# Patient Record
Sex: Female | Born: 1945 | Race: White | Hispanic: No | Smoking: Former smoker
Health system: Southern US, Community
[De-identification: ages and names within clinical notes are randomized; demographics above are authoritative.]

## PROBLEM LIST (undated history)

## (undated) DIAGNOSIS — R600 Localized edema: Secondary | ICD-10-CM

## (undated) DIAGNOSIS — Z720 Tobacco use: Secondary | ICD-10-CM

## (undated) DIAGNOSIS — I639 Cerebral infarction, unspecified: Secondary | ICD-10-CM

## (undated) DIAGNOSIS — R112 Nausea with vomiting, unspecified: Secondary | ICD-10-CM

## (undated) DIAGNOSIS — I491 Atrial premature depolarization: Secondary | ICD-10-CM

## (undated) DIAGNOSIS — IMO0001 Reserved for inherently not codable concepts without codable children: Secondary | ICD-10-CM

## (undated) DIAGNOSIS — K859 Acute pancreatitis without necrosis or infection, unspecified: Secondary | ICD-10-CM

## (undated) DIAGNOSIS — I517 Cardiomegaly: Secondary | ICD-10-CM

## (undated) DIAGNOSIS — J449 Chronic obstructive pulmonary disease, unspecified: Secondary | ICD-10-CM

## (undated) DIAGNOSIS — C801 Malignant (primary) neoplasm, unspecified: Secondary | ICD-10-CM

## (undated) DIAGNOSIS — Z9889 Other specified postprocedural states: Secondary | ICD-10-CM

## (undated) DIAGNOSIS — G459 Transient cerebral ischemic attack, unspecified: Secondary | ICD-10-CM

## (undated) DIAGNOSIS — I10 Essential (primary) hypertension: Secondary | ICD-10-CM

## (undated) DIAGNOSIS — I671 Cerebral aneurysm, nonruptured: Secondary | ICD-10-CM

## (undated) DIAGNOSIS — I2510 Atherosclerotic heart disease of native coronary artery without angina pectoris: Secondary | ICD-10-CM

## (undated) HISTORY — PX: LAPAROSCOPY: SHX197

## (undated) HISTORY — DX: Cerebral aneurysm, nonruptured: I67.1

## (undated) HISTORY — PX: APPENDECTOMY: SHX54

## (undated) HISTORY — DX: Tobacco use: Z72.0

## (undated) HISTORY — DX: Cerebral infarction, unspecified: I63.9

## (undated) HISTORY — PX: NECK SURGERY: SHX720

## (undated) HISTORY — DX: Acute pancreatitis without necrosis or infection, unspecified: K85.90

## (undated) HISTORY — DX: Transient cerebral ischemic attack, unspecified: G45.9

## (undated) HISTORY — DX: Essential (primary) hypertension: I10

## (undated) HISTORY — DX: Localized edema: R60.0

## (undated) HISTORY — PX: ABDOMINAL HYSTERECTOMY: SHX81

## (undated) HISTORY — PX: VESICOVAGINAL FISTULA CLOSURE W/ TAH: SUR271

## (undated) HISTORY — DX: Cardiomegaly: I51.7

## (undated) HISTORY — DX: Atherosclerotic heart disease of native coronary artery without angina pectoris: I25.10

---

## 1994-01-23 DIAGNOSIS — I2510 Atherosclerotic heart disease of native coronary artery without angina pectoris: Secondary | ICD-10-CM

## 1994-01-23 HISTORY — PX: CARDIAC CATHETERIZATION: SHX172

## 1994-01-23 HISTORY — DX: Atherosclerotic heart disease of native coronary artery without angina pectoris: I25.10

## 1997-11-11 ENCOUNTER — Encounter: Payer: Self-pay | Admitting: Emergency Medicine

## 1997-11-11 ENCOUNTER — Emergency Department (HOSPITAL_COMMUNITY): Admission: EM | Admit: 1997-11-11 | Discharge: 1997-11-11 | Payer: Self-pay | Admitting: Emergency Medicine

## 1998-08-10 ENCOUNTER — Ambulatory Visit (HOSPITAL_COMMUNITY): Admission: RE | Admit: 1998-08-10 | Discharge: 1998-08-10 | Payer: Self-pay | Admitting: Family Medicine

## 1999-06-27 ENCOUNTER — Ambulatory Visit (HOSPITAL_COMMUNITY): Admission: RE | Admit: 1999-06-27 | Discharge: 1999-06-27 | Payer: Self-pay | Admitting: Family Medicine

## 1999-09-19 ENCOUNTER — Encounter: Payer: Self-pay | Admitting: Internal Medicine

## 1999-09-19 ENCOUNTER — Encounter: Admission: RE | Admit: 1999-09-19 | Discharge: 1999-09-19 | Payer: Self-pay | Admitting: Internal Medicine

## 1999-11-28 ENCOUNTER — Encounter: Admission: RE | Admit: 1999-11-28 | Discharge: 1999-11-28 | Payer: Self-pay | Admitting: Internal Medicine

## 1999-11-28 ENCOUNTER — Encounter: Payer: Self-pay | Admitting: Internal Medicine

## 2000-11-28 ENCOUNTER — Encounter: Admission: RE | Admit: 2000-11-28 | Discharge: 2000-11-28 | Payer: Self-pay | Admitting: Internal Medicine

## 2000-11-28 ENCOUNTER — Encounter: Payer: Self-pay | Admitting: Internal Medicine

## 2001-12-02 HISTORY — PX: CARDIOVASCULAR STRESS TEST: SHX262

## 2008-11-02 ENCOUNTER — Inpatient Hospital Stay (HOSPITAL_COMMUNITY): Admission: EM | Admit: 2008-11-02 | Discharge: 2008-11-05 | Payer: Self-pay | Admitting: Emergency Medicine

## 2008-11-02 ENCOUNTER — Encounter (INDEPENDENT_AMBULATORY_CARE_PROVIDER_SITE_OTHER): Payer: Self-pay | Admitting: Neurology

## 2008-11-03 ENCOUNTER — Ambulatory Visit: Payer: Self-pay | Admitting: Physical Medicine & Rehabilitation

## 2008-11-03 ENCOUNTER — Encounter (INDEPENDENT_AMBULATORY_CARE_PROVIDER_SITE_OTHER): Payer: Self-pay | Admitting: Neurology

## 2008-11-05 ENCOUNTER — Inpatient Hospital Stay (HOSPITAL_COMMUNITY)
Admission: RE | Admit: 2008-11-05 | Discharge: 2008-11-16 | Payer: Self-pay | Admitting: Physical Medicine & Rehabilitation

## 2008-11-07 ENCOUNTER — Ambulatory Visit: Payer: Self-pay | Admitting: Physical Medicine & Rehabilitation

## 2008-11-24 ENCOUNTER — Emergency Department (HOSPITAL_COMMUNITY): Admission: EM | Admit: 2008-11-24 | Discharge: 2008-11-24 | Payer: Self-pay | Admitting: Emergency Medicine

## 2008-12-11 ENCOUNTER — Encounter
Admission: RE | Admit: 2008-12-11 | Discharge: 2008-12-16 | Payer: Self-pay | Admitting: Physical Medicine & Rehabilitation

## 2008-12-14 ENCOUNTER — Ambulatory Visit: Payer: Self-pay | Admitting: Physical Medicine & Rehabilitation

## 2009-04-23 DIAGNOSIS — I517 Cardiomegaly: Secondary | ICD-10-CM

## 2009-04-23 HISTORY — DX: Cardiomegaly: I51.7

## 2009-04-24 ENCOUNTER — Ambulatory Visit: Payer: Self-pay | Admitting: Cardiovascular Disease

## 2009-04-24 ENCOUNTER — Inpatient Hospital Stay (HOSPITAL_COMMUNITY): Admission: EM | Admit: 2009-04-24 | Discharge: 2009-04-27 | Payer: Self-pay | Admitting: Emergency Medicine

## 2009-04-26 ENCOUNTER — Encounter: Payer: Self-pay | Admitting: Cardiology

## 2009-04-26 HISTORY — PX: US ECHOCARDIOGRAPHY: HXRAD669

## 2009-10-29 ENCOUNTER — Ambulatory Visit: Payer: Self-pay | Admitting: Cardiology

## 2009-11-29 ENCOUNTER — Ambulatory Visit: Payer: Self-pay | Admitting: Cardiology

## 2010-02-07 ENCOUNTER — Encounter: Payer: Self-pay | Admitting: Sports Medicine

## 2010-02-22 ENCOUNTER — Ambulatory Visit
Admission: RE | Admit: 2010-02-22 | Discharge: 2010-02-22 | Payer: Self-pay | Source: Home / Self Care | Attending: Sports Medicine | Admitting: Sports Medicine

## 2010-02-22 DIAGNOSIS — M84376A Stress fracture, unspecified foot, initial encounter for fracture: Secondary | ICD-10-CM | POA: Insufficient documentation

## 2010-02-23 DIAGNOSIS — I6350 Cerebral infarction due to unspecified occlusion or stenosis of unspecified cerebral artery: Secondary | ICD-10-CM | POA: Insufficient documentation

## 2010-02-24 NOTE — Consult Note (Signed)
Summary: [facility]  [facility]   Imported By: Marily Memos 02/09/2010 10:45:16  _____________________________________________________________________  External Attachment:    Type:   Image     Comment:   External Document

## 2010-03-02 NOTE — Assessment & Plan Note (Signed)
Summary: NP ORTHOTICS PER DR Makenlee Mckeag/[facility]   History of Present Illness: 66yo female referred to office by Dr. Margaretha Sheffield for custom orthotics.  Pt with Lt 5th MT stress fx diagnosed 01/20/10.  Treated with post-op shoe initially, has been in cam-boot for past 2-weeks with improving pain.  Still has some swelling in foot at end of day.  Denies any numbness/tingling.    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  She walks with assistance of a cane.      Allergies (verified): 1)  ! Dilaudid (Hydromorphone Hcl) 2)  ! 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  Mildy TTP along proximal 5th MT.  No TTP along 1-4th MTs.  Mild pain with MT squeeze.  No TTP along PF.  Collapse of longitudinal arch with associated pronation, collapse of transverse arch & splaying between 1st & 2nd toes.  Callous along 2nd, 3rd, 4th MT heads. - R foot: no soft tissue swelling.  No tenderness over forefoot, midfoot, hindfoort.  Collapse of longitudinal arch, minimal transverse arch collapse.    GAIT: no leg length difference.  Antalgic gait favoring left foot.  Noted to have over-pronation b/l. Pulses:  +2/4 DP & PT Neurologic:  sensation intact to light touch.     Impression & Recommendations:  Problem # 1:  STRESS FRACTURE, FOOT (ICD-733.94)  - Lt 5th MT stress fx - likely secondary to altered gait mechanics from previous CVA.  Also noted to have increased pronation on exam today. - Fitted with custom orthotics in office today Patient was fitted for a : standard, cushioned, semi-rigid orthotic. The  orthotic was heated and afterward the patient stood on the orthotic blank positioned on the orthotic stand. The patient was positioned in subtalar neutral position and 10 degrees of ankle dorsiflexion in a weight bearing stance. After completion of molding, a stable base was applied to the orthotic blank. The blank was ground to a stable position for weight bearing. Size: 8 - black stripped Base: blue EVA Posting: none Additional orthotic padding: MT pad on left insole Orthotics comfortable to patient in office, noted to have more neutral gait & less pronation 45-mins spent with patient for orthotic prep - Cont. to wear cam-boot until f/u with Dr. Margaretha Sheffield next week, will need to wear orthotic in all regular shoes when transition out of cam-boot - f/u as needed  Orders: [employer], each unit [phone])  Problem # 2:  Hx of CVA (ICD-434.91)  - Contributing to altered gait mechanics which likely lead to stress fx  Her updated medication list for this problem includes:    Plavix 75 Mg Tabs (Clopidogrel bisulfate) .Marland Kitchen... 1 tab by mouth daily  Orders: [employer], each unit ([id number])  Complete Medication List: 1)  Toprol  2)  Plavix 75 Mg Tabs (Clopidogrel bisulfate) .Marland Kitchen.. 1 tab by mouth daily 3)  Lisinopril  4)  Aspirin    Orders Added: 1)  New Patient Level III [82956] 2)  [facility] [employer], each unit [L3002]

## 2010-03-03 ENCOUNTER — Ambulatory Visit: Payer: Self-pay | Admitting: Cardiology

## 2010-03-29 ENCOUNTER — Encounter: Payer: Self-pay | Admitting: Cardiology

## 2010-03-29 DIAGNOSIS — I10 Essential (primary) hypertension: Secondary | ICD-10-CM | POA: Insufficient documentation

## 2010-03-29 DIAGNOSIS — I517 Cardiomegaly: Secondary | ICD-10-CM | POA: Insufficient documentation

## 2010-03-29 DIAGNOSIS — G459 Transient cerebral ischemic attack, unspecified: Secondary | ICD-10-CM | POA: Insufficient documentation

## 2010-04-13 LAB — LIPID PANEL
Cholesterol: 146 mg/dL (ref 0–200)
HDL: 46 mg/dL (ref >39)
Triglycerides: 146 mg/dL (ref <150)
VLDL: 29 mg/dL (ref 0–40)

## 2010-04-13 LAB — CBC
HCT: 40.2 % (ref 36.0–46.0)
MCHC: 34.4 g/dL (ref 30.0–36.0)
WBC: 4.5 10*3/uL (ref 4.0–10.5)

## 2010-04-13 LAB — URINE MICROSCOPIC-ADD ON

## 2010-04-13 LAB — PROTIME-INR: INR: 0.96 (ref 0.00–1.49)

## 2010-04-13 LAB — URINALYSIS, ROUTINE W REFLEX MICROSCOPIC
Bilirubin Urine: NEGATIVE
Ketones, ur: NEGATIVE mg/dL
Specific Gravity, Urine: 1.009 (ref 1.005–1.030)
pH: 7.5 (ref 5.0–8.0)

## 2010-04-13 LAB — BASIC METABOLIC PANEL
BUN: 11 mg/dL (ref 6–23)
BUN: 6 mg/dL (ref 6–23)
CO2: 28 mEq/L (ref 19–32)
Calcium: 8.7 mg/dL (ref 8.4–10.5)
Calcium: 9.1 mg/dL (ref 8.4–10.5)
Calcium: 9.6 mg/dL (ref 8.4–10.5)
Chloride: 105 mEq/L (ref 96–112)
Creatinine, Ser: 0.63 mg/dL (ref 0.4–1.2)
Creatinine, Ser: 0.71 mg/dL (ref 0.4–1.2)
GFR calc non Af Amer: >60 mL/min (ref >60)
Glucose, Bld: 110 mg/dL — ABNORMAL HIGH (ref 70–99)
Glucose, Bld: 124 mg/dL — ABNORMAL HIGH (ref 70–99)
Potassium: 3.6 mEq/L (ref 3.5–5.1)
Sodium: 136 mEq/L (ref 135–145)
Sodium: 141 mEq/L (ref 135–145)

## 2010-04-13 LAB — POCT CARDIAC MARKERS
Myoglobin, poc: 94.9 ng/mL (ref 12–200)
Troponin i, poc: <0.05 ng/mL (ref 0.00–0.09)

## 2010-04-13 LAB — HEMOGLOBIN A1C: Mean Plasma Glucose: 120 mg/dL

## 2010-04-13 LAB — DIGOXIN LEVEL: Digoxin Level: 0.9 ng/mL (ref 0.8–2.0)

## 2010-04-25 ENCOUNTER — Ambulatory Visit (INDEPENDENT_AMBULATORY_CARE_PROVIDER_SITE_OTHER): Payer: 59 | Admitting: Cardiology

## 2010-04-25 ENCOUNTER — Encounter: Payer: Self-pay | Admitting: Cardiology

## 2010-04-25 VITALS — BP 130/80 | HR 56 | Wt 143.0 lb

## 2010-04-25 DIAGNOSIS — Z79899 Other long term (current) drug therapy: Secondary | ICD-10-CM

## 2010-04-25 DIAGNOSIS — G459 Transient cerebral ischemic attack, unspecified: Secondary | ICD-10-CM

## 2010-04-25 DIAGNOSIS — I10 Essential (primary) hypertension: Secondary | ICD-10-CM

## 2010-04-25 DIAGNOSIS — M84376A Stress fracture, unspecified foot, initial encounter for fracture: Secondary | ICD-10-CM

## 2010-04-25 LAB — BASIC METABOLIC PANEL
Calcium: 8.8 mg/dL (ref 8.4–10.5)
Chloride: 100 mEq/L (ref 96–112)
Creatinine, Ser: 0.6 mg/dL (ref 0.4–1.2)
Glucose, Bld: 82 mg/dL (ref 70–99)
Potassium: 4.1 mEq/L (ref 3.5–5.1)

## 2010-04-25 NOTE — Assessment & Plan Note (Signed)
The patient's blood pressure has been improving at home.  She does have a slight dry cough from the lisinopril but does not wish to switch to something else.  Previously we had tried Benicar which she did not like.  We'll continue same dose of lisinopril 10 mg daily.  She takes her hydrochlorothiazide and her potassium only if she has swelling

## 2010-04-25 NOTE — Assessment & Plan Note (Signed)
Since last visit the patient has been feeling better.  She's had no further symptoms of TIA or stroke.  She's not having any chest pain or angina.

## 2010-04-25 NOTE — Assessment & Plan Note (Signed)
The patient broke a bone in the left lateral foot.  She is wearing a special insert in her shoe.  There is no history of trauma involved with the fracture.  It is gradually improving

## 2010-04-25 NOTE — Progress Notes (Signed)
History of Present Illness: This pleasant 65 year old woman is seen for a scheduled followup office visit.  She was hospitalized a year ago from 04/23/09 through 04/27/09 for a transient ischemic attack involving the right side of her body.  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  She's had a history of labile hypertension.  She's had a history of left ventricular hypertrophy with diastolic dysfunction.  She has not had clinical congestive heart failure.  She still smokes a few cigarettes daily against advice  Current Outpatient Prescriptions  Medication Sig Dispense Refill  . aspirin 81 MG tablet Take 81 mg by mouth daily. Taking every other day      . clopidogrel (PLAVIX) 75 MG tablet Take 75 mg by mouth daily.        . diazepam (VALIUM) 5 MG tablet Take 5 mg by mouth every 6 (six) hours as needed.        . digoxin (LANOXIN) 0.25 MG tablet Take 250 mcg by mouth daily.        . hydrochlorothiazide 25 MG tablet Take 25 mg by mouth as needed.        Marland Kitchen lisinopril (PRINIVIL,ZESTRIL) 10 MG tablet Take 10 mg by mouth daily.        . metoprolol (TOPROL-XL) 50 MG 24 hr tablet Take 25 mg by mouth 2 (two) times daily.        . potassium chloride (KLOR-CON) 10 MEQ CR tablet Take 10 mEq by mouth as needed.          Allergies  Allergen Reactions  . Codeine   . Hydromorphone Hcl     Patient Active Problem List  Diagnoses  . CVA  . STRESS FRACTURE, FOOT  . TIA (transient ischemic attack)  . Hypertension  . Ventricular hypertrophy    History  Smoking status  . Passive Smoker  . Types: Cigarettes  Smokeless tobacco  . Not on file    History  Alcohol Use No    Family History  Problem Relation Age of Onset  . Hypertension Mother   . Emphysema Father     Review of Systems: Constitutional: no fever chills diaphoresis or fatigue or change in weight.  Head and neck: no hearing loss,  no epistaxis, no photophobia or visual disturbance. Respiratory: No cough, shortness of breath or wheezing. Cardiovascular: No chest pain peripheral edema, palpitations. Gastrointestinal: No abdominal distention, no abdominal pain, no change in bowel habits hematochezia or melena. Genitourinary: No dysuria, no frequency, no urgency, no nocturia. Musculoskeletal:No arthralgias, no back pain, no gait disturbance or myalgias. Neurological: No dizziness, no headaches, no numbness, no seizures, no syncope, no weakness, no tremors. Hematologic: No lymphadenopathy, no easy bruising. Psychiatric: No confusion, no hallucinations, no sleep disturbance.    Physical Exam: Filed Vitals:   04/25/10 1019  BP: 130/80  Pulse: 56  Weight is 143, up 1 pound.  This is a well-developed well-nourished woman in no distress.Pupils equal and reactive.   Extraocular Movements are full.  There is no scleral icterus.  The mouth and pharynx are normal.  The neck is supple.  The carotids reveal no bruits.  The jugular venous pressure is normal.  The thyroid is not enlarged.  There is no lymphadenopathy.The chest is clear to percussion and auscultation. There are no rales or rhonchi. Expansion of the chest is symmetrical.The precordium is quiet.  The first heart sound  is normal.  The second heart sound is physiologically split.  There is no murmur gallop rub or click.  There is no abnormal lift or heave.The abdomen is soft and nontender. Bowel sounds are normal. The liver and spleen are not enlarged. There Are no abdominal masses. There are no bruits.The pedal pulses are good.  There is no phlebitis or edema.  There is no cyanosis or clubbing.  Neurologic exam reveals slightly decreased equilibrium and decreased balance.   Assessment / Plan:  We counseled her about quitting smoking altogether.  She is to continue same dose of medication.  Recheck in 6 months for followup office visit and fasting lipid panel and  chemistries

## 2010-04-27 ENCOUNTER — Telehealth: Payer: Self-pay | Admitting: *Deleted

## 2010-04-27 LAB — DIGOXIN LEVEL: Digoxin Level: 0.2 ng/mL — ABNORMAL LOW (ref 0.8–2.0)

## 2010-04-27 LAB — POCT I-STAT, CHEM 8
BUN: 8 mg/dL (ref 6–23)
Chloride: 102 mEq/L (ref 96–112)
Potassium: 3.9 mEq/L (ref 3.5–5.1)

## 2010-04-27 NOTE — Telephone Encounter (Signed)
Message copied by Regis Bill on Wed Apr 27, 2010 11:59 AM ------      Message from: Cassell Clement      Created: Tue Apr 26, 2010  8:48 AM       BMET normal   CSD.

## 2010-04-27 NOTE — Telephone Encounter (Signed)
Advised patient of labs 

## 2010-04-28 LAB — DIFFERENTIAL
Basophils Absolute: 0 10*3/uL (ref 0.0–0.1)
Basophils Relative: 0 % (ref 0–1)
Basophils Relative: 1 % (ref 0–1)
Eosinophils Absolute: 0 10*3/uL (ref 0.0–0.7)
Lymphs Abs: 1.4 10*3/uL (ref 0.7–4.0)
Monocytes Absolute: 0.3 10*3/uL (ref 0.1–1.0)
Monocytes Relative: 6 % (ref 3–12)
Neutro Abs: 2.6 10*3/uL (ref 1.7–7.7)
Neutro Abs: 3.3 10*3/uL (ref 1.7–7.7)
Neutrophils Relative %: 61 % (ref 43–77)
Neutrophils Relative %: 66 % (ref 43–77)

## 2010-04-28 LAB — URINE CULTURE: Colony Count: 7000

## 2010-04-28 LAB — CK TOTAL AND CKMB (NOT AT ARMC)
CK, MB: 1.3 ng/mL (ref 0.3–4.0)
CK, MB: 1.3 ng/mL (ref 0.3–4.0)
Relative Index: INVALID (ref 0.0–2.5)
Relative Index: INVALID (ref 0.0–2.5)
Total CK: 47 U/L (ref 7–177)
Total CK: 50 U/L (ref 7–177)

## 2010-04-28 LAB — CBC
HCT: 38.1 % (ref 36.0–46.0)
Hemoglobin: 13.4 g/dL (ref 12.0–15.0)
Hemoglobin: 14 g/dL (ref 12.0–15.0)
MCHC: 35.2 g/dL (ref 30.0–36.0)
MCV: 86.8 fL (ref 78.0–100.0)
MCV: 87 fL (ref 78.0–100.0)
Platelets: 144 10*3/uL — ABNORMAL LOW (ref 150–400)
RBC: 4.68 MIL/uL (ref 3.87–5.11)
RDW: 14.2 % (ref 11.5–15.5)
WBC: 5 10*3/uL (ref 4.0–10.5)

## 2010-04-28 LAB — APTT
aPTT: 26 seconds (ref 24–37)
aPTT: 26 seconds (ref 24–37)

## 2010-04-28 LAB — COMPREHENSIVE METABOLIC PANEL
ALT: 21 U/L (ref 0–35)
ALT: 23 U/L (ref 0–35)
AST: 27 U/L (ref 0–37)
Albumin: 3.5 g/dL (ref 3.5–5.2)
Albumin: 3.7 g/dL (ref 3.5–5.2)
Alkaline Phosphatase: 34 U/L — ABNORMAL LOW (ref 39–117)
Alkaline Phosphatase: 36 U/L — ABNORMAL LOW (ref 39–117)
BUN: 17 mg/dL (ref 6–23)
Calcium: 8.8 mg/dL (ref 8.4–10.5)
Calcium: 8.9 mg/dL (ref 8.4–10.5)
Creatinine, Ser: 0.61 mg/dL (ref 0.4–1.2)
GFR calc Af Amer: >60 mL/min (ref >60)
GFR calc Af Amer: >60 mL/min (ref >60)
Glucose, Bld: 106 mg/dL — ABNORMAL HIGH (ref 70–99)
Glucose, Bld: 108 mg/dL — ABNORMAL HIGH (ref 70–99)
Potassium: 3.6 mEq/L (ref 3.5–5.1)
Potassium: 3.6 mEq/L (ref 3.5–5.1)
Sodium: 139 mEq/L (ref 135–145)
Sodium: 141 mEq/L (ref 135–145)
Total Bilirubin: 0.9 mg/dL (ref 0.3–1.2)
Total Protein: 6.4 g/dL (ref 6.0–8.3)
Total Protein: 6.8 g/dL (ref 6.0–8.3)
Total Protein: 7.1 g/dL (ref 6.0–8.3)

## 2010-04-28 LAB — BASIC METABOLIC PANEL
CO2: 27 mEq/L (ref 19–32)
Calcium: 9 mg/dL (ref 8.4–10.5)
Chloride: 106 mEq/L (ref 96–112)
GFR calc Af Amer: >60 mL/min (ref >60)
GFR calc non Af Amer: >60 mL/min (ref >60)
GFR calc non Af Amer: >60 mL/min (ref >60)
Glucose, Bld: 98 mg/dL (ref 70–99)
Glucose, Bld: 102 mg/dL — ABNORMAL HIGH (ref 70–99)
Potassium: 4.2 mEq/L (ref 3.5–5.1)
Potassium: 3.5 mEq/L (ref 3.5–5.1)
Sodium: 139 mEq/L (ref 135–145)
Sodium: 140 mEq/L (ref 135–145)

## 2010-04-28 LAB — PROTIME-INR
INR: 1.02 (ref 0.00–1.49)
INR: 1.02 (ref 0.00–1.49)
Prothrombin Time: 13.3 seconds (ref 11.6–15.2)

## 2010-04-28 LAB — DIGOXIN LEVEL: Digoxin Level: 0.5 ng/mL — ABNORMAL LOW (ref 0.8–2.0)

## 2010-04-28 LAB — URINALYSIS, MICROSCOPIC ONLY
Glucose, UA: NEGATIVE mg/dL
Nitrite: NEGATIVE
Protein, ur: NEGATIVE mg/dL

## 2010-04-28 LAB — LIPID PANEL
Cholesterol: 161 mg/dL (ref 0–200)
LDL Cholesterol: 85 mg/dL (ref 0–99)
VLDL: 39 mg/dL (ref 0–40)

## 2010-06-21 ENCOUNTER — Telehealth: Payer: Self-pay | Admitting: Cardiology

## 2010-06-21 NOTE — Telephone Encounter (Signed)
Rtn call to pt. Pt reports feeling better but still has cough. She reports drainage to be green/yellow. She is taking Robutussin DM 1 tsp q3-4 hours with relief. Pt advised  Dr Patty Sermons recommends Mucinex 600mg  OTC BID with plenty of liquids and that he would give script for a z-pak. She has concerns of taking z-pak due to other meds she takes and medical hx. She knows she can take amoxicillin and would take that if Dr Patty Sermons would see it necessary. Informed her would discuss with Dr Patty Sermons and get back with her.

## 2010-06-21 NOTE — Telephone Encounter (Signed)
PT SAID SHE HAS HAD A COLD FOR ABOUT 2 AND HALF WEEKS, THINKS IT IS BRONCHIAL AND WANTS TO SEE DR BB. CHART IN BOX.

## 2010-06-22 MED ORDER — AMOXICILLIN 500 MG PO CAPS: 500.0000 mg | ORAL_CAPSULE | Freq: Three times a day (TID) | ORAL | Status: AC

## 2010-06-22 NOTE — Telephone Encounter (Signed)
Returned call to pt she thinks she need the amoxicillin but is feeling better. Has not tried mucinex. She is heading to the pharmacy now.

## 2010-06-27 ENCOUNTER — Other Ambulatory Visit: Payer: Self-pay | Admitting: *Deleted

## 2010-06-27 MED ORDER — DIGOXIN 250 MCG PO TABS: ORAL_TABLET | ORAL | Status: DC

## 2010-06-27 NOTE — Telephone Encounter (Signed)
escribe medication per fax request  

## 2010-07-01 ENCOUNTER — Other Ambulatory Visit: Payer: Self-pay | Admitting: *Deleted

## 2010-07-01 MED ORDER — AMOXICILLIN 500 MG PO CAPS: 500.0000 mg | ORAL_CAPSULE | Freq: Three times a day (TID) | ORAL | Status: AC

## 2010-07-01 NOTE — Telephone Encounter (Signed)
Patient phoned requesting 10 more amoxicillin 500 mg three times a day.  States feeling better but thinks she needs a few more days worth.  Ok per Dr Patty Sermons Left message

## 2010-07-04 ENCOUNTER — Encounter: Payer: Self-pay | Admitting: Cardiology

## 2010-10-07 ENCOUNTER — Encounter: Payer: Self-pay | Admitting: Nurse Practitioner

## 2010-10-11 ENCOUNTER — Encounter: Payer: Self-pay | Admitting: Nurse Practitioner

## 2010-10-24 ENCOUNTER — Encounter: Payer: Self-pay | Admitting: Nurse Practitioner

## 2010-10-24 ENCOUNTER — Ambulatory Visit (INDEPENDENT_AMBULATORY_CARE_PROVIDER_SITE_OTHER): Payer: Medicare Other | Admitting: Nurse Practitioner

## 2010-10-24 ENCOUNTER — Ambulatory Visit (INDEPENDENT_AMBULATORY_CARE_PROVIDER_SITE_OTHER): Payer: Medicare Other | Admitting: *Deleted

## 2010-10-24 DIAGNOSIS — I10 Essential (primary) hypertension: Secondary | ICD-10-CM

## 2010-10-24 DIAGNOSIS — I6350 Cerebral infarction due to unspecified occlusion or stenosis of unspecified cerebral artery: Secondary | ICD-10-CM

## 2010-10-24 DIAGNOSIS — F17200 Nicotine dependence, unspecified, uncomplicated: Secondary | ICD-10-CM

## 2010-10-24 DIAGNOSIS — Z720 Tobacco use: Secondary | ICD-10-CM | POA: Insufficient documentation

## 2010-10-24 DIAGNOSIS — G459 Transient cerebral ischemic attack, unspecified: Secondary | ICD-10-CM

## 2010-10-24 LAB — BASIC METABOLIC PANEL
CO2: 25 mEq/L (ref 19–32)
Chloride: 104 mEq/L (ref 96–112)
Glucose, Bld: 98 mg/dL (ref 70–99)
Potassium: 4.1 mEq/L (ref 3.5–5.1)
Sodium: 140 mEq/L (ref 135–145)

## 2010-10-24 LAB — LIPID PANEL
Cholesterol: 165 mg/dL (ref 0–200)
LDL Cholesterol: 83 mg/dL (ref 0–99)

## 2010-10-24 LAB — HEPATIC FUNCTION PANEL
ALT: 21 U/L (ref 0–35)
AST: 26 U/L (ref 0–37)
Alkaline Phosphatase: 39 U/L (ref 39–117)
Bilirubin, Direct: 0.2 mg/dL (ref 0.0–0.3)
Total Protein: 7.1 g/dL (ref 6.0–8.3)

## 2010-10-24 NOTE — Patient Instructions (Addendum)
Stay on your current medicines. We will see you back in about 6 months I encourage you to work on your smoking and try to stop.  Stay active. Monitor your blood pressure at home.  Record your readings and bring to your next visit. Goal is for your blood pressure to stay below 135/85 or less. Call if blood pressure remains up. Limit sodium intake.   Call for any problems.

## 2010-10-24 NOTE — Assessment & Plan Note (Signed)
I have explained the importance of good blood pressure control and smoking cessation to prevent recurrence.

## 2010-10-24 NOTE — Assessment & Plan Note (Signed)
Blood pressure is up here today. She thinks it is fine at home. She has had her medicines this morning. She is willing to recheck and start back monitoring at home. Goal is to be less than 135/85. She is to call if her readings remain higher. We will tentatively see her back in 6 months. Patient is agreeable to this plan and will call if any problems develop in the interim.

## 2010-10-24 NOTE — Progress Notes (Signed)
[facility] Date of Birth: [birth year]   History of Present Illness: [facility] is seen today for her 6 month visit. She is seen for Dr. Patty Sermons. She is doing well. Still smoking some. No chest pain or shortness of breath. Blood pressure is up today. She says it is fine at home but admits to not checking it recently. She continues to use her HCTZ prn. She is tolerating her medicines. She has chronic swelling of her left foot. She has no real complaint today.   Current Outpatient Prescriptions on File Prior to Visit  Medication Sig Dispense Refill  . aspirin 81 MG tablet Take 81 mg by mouth daily. Taking every other day      . clopidogrel (PLAVIX) 75 MG tablet Take 75 mg by mouth daily.        . diazepam (VALIUM) 5 MG tablet Take 5 mg by mouth every 6 (six) hours as needed.        . hydrochlorothiazide 25 MG tablet Take 25 mg by mouth as needed.        Marland Kitchen lisinopril (PRINIVIL,ZESTRIL) 10 MG tablet Take 10 mg by mouth daily.        . metoprolol (TOPROL-XL) 50 MG 24 hr tablet Take 25 mg by mouth 2 (two) times daily.        . potassium chloride (KLOR-CON) 10 MEQ CR tablet Take 10 mEq by mouth as needed.        Marland Kitchen DISCONTD: digoxin (LANOXIN) 0.25 MG tablet Take 1/2 tablet twice a day  30 tablet  11    Allergies  Allergen Reactions  . Codeine   . Hydromorphone Hcl   . Sulfa Drugs Cross Reactors     Past Medical History  Diagnosis Date  . Ventricular hypertrophy 04/2009    LVH with diastolic dysfunction by echo. Has normal EF.  Marland Kitchen Pancreatitis     x2  . PAF (paroxysmal atrial fibrillation)   . TIA (transient ischemic attack)     She was hospitalized 04-23-09 through 04-27-09 for involving right side of the body  . 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 Hypertension   . 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  . Tobacco abuse     Ongoing     . Edema of foot     She has a history of chronic edema of the left dated back to age 73 when she suufered severe frostbite playing  on the snow as a child  . Coronary artery disease 1996    Known with prior mild lesion of LAD demonstrated by Cardiac Catheterization in 1996    Past Surgical History  Procedure Date  . 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 Neck surgery 50 yrs ago    Left side  . Cardiac catheterization 1996    Mild CAD with vasospasm  . US echocardiography 04-26-2009    EF 65-70%  . Cardiovascular stress test 12-02-2001    EF 70%    History  Smoking status  . Passive Smoker  . Types: Cigarettes  Smokeless tobacco  . Current User    History  Alcohol Use No    Family History  Problem Relation Age of Onset  . Emphysema Father   . Aneurysm Sister  Review of Systems: The review of systems is positive for chronic edema of the left foot. She uses her HCTZ prn. Daily use of it makes her feel weak.  Still has some residual issues from her prior stroke. Walks with a limp and some weakness in the arm. All other systems were reviewed and are negative.  Physical Exam: BP 170/90  Pulse 54  Ht 5\' 2"  (1.575 m)  Wt 141 lb 1.9 oz (64.012 kg)  BMI 25.81 kg/m2 Patient is very pleasant and in no acute distress. Skin is warm and dry. Color is normal.  HEENT is unremarkable. Normocephalic/atraumatic. PERRL. Sclera are nonicteric. Neck is supple. No masses. No JVD. Lungs are clear. Cardiac exam shows a regular rate and rhythm. Abdomen is soft. Extremities are with 2+ edema on the left. Gait and ROM are intact. She is using a cane. No gross neurologic deficits noted.   LABORATORY DATA:  PENDING   Assessment / Plan:

## 2010-10-24 NOTE — Assessment & Plan Note (Signed)
Smoking cessation is encouraged 

## 2010-10-28 ENCOUNTER — Telehealth: Payer: Self-pay | Admitting: *Deleted

## 2010-10-28 NOTE — Telephone Encounter (Signed)
Message copied by Burnell Blanks on Fri Oct 28, 2010  2:26 PM ------      Message from: Cassell Clement      Created: Tue Oct 25, 2010  8:34 AM       Please report.  The lipids are satisfactory.  The triglycerides are improved.  The chemistries are satisfactory.  Continue same medication.

## 2010-10-28 NOTE — Telephone Encounter (Signed)
Advised of labs 

## 2010-10-28 NOTE — Progress Notes (Signed)
Advised of labs 

## 2011-01-25 ENCOUNTER — Other Ambulatory Visit: Payer: Self-pay | Admitting: Cardiology

## 2011-01-25 MED ORDER — METOPROLOL SUCCINATE ER 50 MG PO TB24: 25.0000 mg | ORAL_TABLET | Freq: Two times a day (BID) | ORAL | Status: DC

## 2011-02-23 ENCOUNTER — Other Ambulatory Visit: Payer: Self-pay | Admitting: *Deleted

## 2011-02-23 MED ORDER — CLOPIDOGREL BISULFATE 75 MG PO TABS: 75.0000 mg | ORAL_TABLET | Freq: Every day | ORAL | Status: DC

## 2011-02-23 NOTE — Telephone Encounter (Signed)
Refilled plavix 

## 2011-03-22 ENCOUNTER — Other Ambulatory Visit: Payer: Self-pay | Admitting: *Deleted

## 2011-03-22 MED ORDER — LISINOPRIL 10 MG PO TABS: 10.0000 mg | ORAL_TABLET | Freq: Every day | ORAL | Status: DC

## 2011-04-24 ENCOUNTER — Ambulatory Visit: Payer: Medicare Other | Admitting: Cardiology

## 2011-05-15 ENCOUNTER — Encounter: Payer: Self-pay | Admitting: Cardiology

## 2011-05-15 ENCOUNTER — Ambulatory Visit (INDEPENDENT_AMBULATORY_CARE_PROVIDER_SITE_OTHER): Payer: Medicare Other | Admitting: Cardiology

## 2011-05-15 VITALS — BP 110/80 | HR 64 | Ht 62.0 in | Wt 144.0 lb

## 2011-05-15 DIAGNOSIS — G459 Transient cerebral ischemic attack, unspecified: Secondary | ICD-10-CM

## 2011-05-15 DIAGNOSIS — I6350 Cerebral infarction due to unspecified occlusion or stenosis of unspecified cerebral artery: Secondary | ICD-10-CM

## 2011-05-15 DIAGNOSIS — I10 Essential (primary) hypertension: Secondary | ICD-10-CM

## 2011-05-15 DIAGNOSIS — I119 Hypertensive heart disease without heart failure: Secondary | ICD-10-CM

## 2011-05-15 NOTE — Assessment & Plan Note (Signed)
The patient remains on daily aspirin.  She's had no recurrent TIA or stroke symptoms

## 2011-05-15 NOTE — Assessment & Plan Note (Signed)
Blood pressure has been remaining stable on current therapy.  She's not having any headaches or dizzy spells.  She has not been experiencing any palpitations and she is on long-term brand name Lanoxin

## 2011-05-15 NOTE — Progress Notes (Signed)
[facility] Date of Birth:  May 16, 1945 [facility] [address] [place], [place]  [address] [phone]         Fax   [phone]  History of Present Illness: This pleasant 66 year old woman is seen for a scheduled followup office visit.  He has a past history of a prior stroke.  She's had problems with high blood pressure in the past.  His last visit she's been doing well with no new cardiac symptoms.  The date of her stroke was October 2010.  Current Outpatient Prescriptions  Medication Sig Dispense Refill  . aspirin 81 MG tablet Take 81 mg by mouth daily. Taking every other day      . clopidogrel (PLAVIX) 75 MG tablet Take 1 tablet (75 mg total) by mouth daily.  30 tablet  5  . diazepam (VALIUM) 5 MG tablet Take 5 mg by mouth every 6 (six) hours as needed.        . digoxin (LANOXIN) 0.25 MG tablet Take 250 mcg by mouth daily.        . hydrochlorothiazide 25 MG tablet Take 25 mg by mouth as needed.        Marland Kitchen lisinopril (PRINIVIL,ZESTRIL) 10 MG tablet Take 1 tablet (10 mg total) by mouth daily.  30 tablet  6  . metoprolol (TOPROL-XL) 50 MG 24 hr tablet Take 0.5 tablets (25 mg total) by mouth 2 (two) times daily.  30 tablet  6  . potassium chloride (KLOR-CON) 10 MEQ CR tablet Take 10 mEq by mouth as needed.          Allergies  Allergen Reactions  . Codeine   . Hydromorphone Hcl   . Sulfa Drugs Cross Reactors     Patient Active Problem List  Diagnoses  . CVA  . STRESS FRACTURE, FOOT  . TIA (transient ischemic attack)  . Hypertension  . Ventricular hypertrophy  . Tobacco abuse    History  Smoking status  . Passive Smoker  . Types: Cigarettes  Smokeless tobacco  . Current User    History  Alcohol Use No    Family History  Problem Relation Age of Onset  . Emphysema Father   . Aneurysm Sister     Review of Systems: Constitutional: no fever chills diaphoresis or fatigue or change in weight.  Head and neck: no hearing loss, no  epistaxis, no photophobia or visual disturbance. Respiratory: No cough, shortness of breath or wheezing. Cardiovascular: No chest pain peripheral edema, palpitations. Gastrointestinal: No abdominal distention, no abdominal pain, no change in bowel habits hematochezia or melena. Genitourinary: No dysuria, no frequency, no urgency, no nocturia. Musculoskeletal:No arthralgias, no back pain, no gait disturbance or myalgias. Neurological: No dizziness, no headaches, no numbness, no seizures, no syncope, no weakness, no tremors. Hematologic: No lymphadenopathy, no easy bruising. Psychiatric: No confusion, no hallucinations, no sleep disturbance.    Physical Exam: Filed Vitals:   05/15/11 1000  BP: 110/80  Pulse: 64   the general appearance reveals a well-developed well-nourished woman in no distress.The head and neck exam reveals pupils equal and reactive.  Extraocular movements are full.  There is no scleral icterus.  The mouth and pharynx are normal.  The neck is supple.  The carotids reveal no bruits.  The jugular venous pressure is normal.  The  thyroid is not enlarged.  There is no lymphadenopathy.  The chest is clear to percussion and auscultation.  There are no rales or rhonchi.  Expansion of the chest  is symmetrical.  The precordium is quiet.  The first heart sound is normal.  The second heart sound is physiologically split.  There is no murmur gallop rub or click.  There is no abnormal lift or heave.  The abdomen is soft and nontender.  The bowel sounds are normal.  The liver and spleen are not enlarged.  There are no abdominal masses.  There are no abdominal bruits.  Extremities reveal good pedal pulses.  There is no phlebitis or edema.  There is no cyanosis or clubbing.  Strength is normal and symmetrical in all extremities.  There is no lateralizing weakness.  There are no sensory deficits.  The skin is warm and dry.  There is no rash.    Assessment / Plan: Continue same medication.   Recheck in 6 months for followup office visit EKG lipid panel hepatic function panel and basal metabolic panel.  Also she states that when she was hospitalized she was told that her tests showed that she had an STD but she doesn't recall any of the details.  She wants to know more about it.  I don't see any mention of this in her hospital discharge summary.  She has some literature at home that she will bring in.

## 2011-05-15 NOTE — Patient Instructions (Signed)
Your physician recommends that you continue on your current medications as directed. Please refer to the Current Medication list given to you today.  Your physician wants you to follow-up in: 6 months. You will receive a reminder letter in the mail two months in advance. If you don't receive a letter, please call our office to schedule the follow-up appointment.  

## 2011-06-27 ENCOUNTER — Other Ambulatory Visit: Payer: Self-pay | Admitting: Cardiology

## 2011-06-27 MED ORDER — DIGOXIN 250 MCG PO TABS: 250.0000 ug | ORAL_TABLET | Freq: Every day | ORAL | Status: DC

## 2011-06-27 NOTE — Telephone Encounter (Signed)
Refill completed.

## 2011-06-27 NOTE — Telephone Encounter (Signed)
Refill- Digoxin Lanoxin   Verified preferred  [facility].

## 2011-09-20 ENCOUNTER — Other Ambulatory Visit: Payer: Self-pay | Admitting: *Deleted

## 2011-09-20 MED ORDER — METOPROLOL SUCCINATE ER 50 MG PO TB24: ORAL_TABLET | ORAL | Status: DC

## 2011-09-20 NOTE — Telephone Encounter (Signed)
Refilled metoprolol 

## 2011-11-13 ENCOUNTER — Ambulatory Visit (INDEPENDENT_AMBULATORY_CARE_PROVIDER_SITE_OTHER): Payer: Medicare Other | Admitting: Cardiology

## 2011-11-13 ENCOUNTER — Other Ambulatory Visit: Payer: Medicare Other

## 2011-11-13 ENCOUNTER — Encounter: Payer: Self-pay | Admitting: Cardiology

## 2011-11-13 VITALS — BP 166/80 | HR 49 | Ht 61.0 in | Wt 141.8 lb

## 2011-11-13 DIAGNOSIS — I259 Chronic ischemic heart disease, unspecified: Secondary | ICD-10-CM | POA: Insufficient documentation

## 2011-11-13 DIAGNOSIS — Z720 Tobacco use: Secondary | ICD-10-CM

## 2011-11-13 DIAGNOSIS — I119 Hypertensive heart disease without heart failure: Secondary | ICD-10-CM

## 2011-11-13 DIAGNOSIS — F17200 Nicotine dependence, unspecified, uncomplicated: Secondary | ICD-10-CM

## 2011-11-13 DIAGNOSIS — I10 Essential (primary) hypertension: Secondary | ICD-10-CM

## 2011-11-13 DIAGNOSIS — G459 Transient cerebral ischemic attack, unspecified: Secondary | ICD-10-CM

## 2011-11-13 MED ORDER — LISINOPRIL 10 MG PO TABS: 10.0000 mg | ORAL_TABLET | Freq: Every day | ORAL | Status: DC

## 2011-11-13 MED ORDER — CLOPIDOGREL BISULFATE 75 MG PO TABS: 75.0000 mg | ORAL_TABLET | Freq: Every day | ORAL | Status: DC

## 2011-11-13 NOTE — Assessment & Plan Note (Signed)
The patient has a past history of chronic high blood pressure and her blood pressure on arrival today was high.  I rechecked her blood pressure later and it was down to 166/80.  The patient has only been taking one half of her lisinopril daily and we will increase her to a full 10 mg tablet.  She states she does not think she could tolerate it any more than that because it makes her tired.

## 2011-11-13 NOTE — Assessment & Plan Note (Signed)
The patient has had no further TIA symptoms.  She remains on Plavix which we refilled today

## 2011-11-13 NOTE — Assessment & Plan Note (Signed)
Patient continues to smoke 5 cigarettes a day despite her multiple cardiac risk factors.  I talked to her today again about the importance of quitting altogether

## 2011-11-13 NOTE — Progress Notes (Signed)
Jeanne Lawson Date of Birth:  16-Jun-1945 [facility] [address] [place], [place]  [address] [phone]         Fax   [phone]  History of Present Illness: This pleasant 66 year old woman is seen for a scheduled followup office visit. He has a past history of a prior stroke. She's had problems with high blood pressure in the past.  She has a history of prior coronary artery disease and 16 years ago Dr. Ty Hilts did cardiac catheterization and angioplasty but no stent.  We don't have those records.  The patient has had several subsequent Cardiolite stress tests which have been normal but have been poorly tolerated by the patient and she does not want any further nuclear stress tests.  She does have a chronically abnormal EKG with anterolateral T wave inversion.  Since last visit she's been doing well with no new cardiac symptoms. The date of her stroke was October 2010.   Current Outpatient Prescriptions  Medication Sig Dispense Refill  . aspirin 81 MG tablet Take 81 mg by mouth daily. Taking every other day      . clopidogrel (PLAVIX) 75 MG tablet Take 1 tablet (75 mg total) by mouth daily.  30 tablet  11  . diazepam (VALIUM) 5 MG tablet Take 5 mg by mouth every 6 (six) hours as needed.        . digoxin (LANOXIN) 0.25 MG tablet Take 1 tablet (250 mcg total) by mouth daily.  30 tablet  5  . lisinopril (PRINIVIL,ZESTRIL) 10 MG tablet Take 1 tablet (10 mg total) by mouth daily.  30 tablet  11  . metoprolol succinate (TOPROL-XL) 50 MG 24 hr tablet Take 1/2 tablet twice a day  30 tablet  6  . potassium chloride (KLOR-CON) 10 MEQ CR tablet Take 10 mEq by mouth as needed.        Marland Kitchen DISCONTD: clopidogrel (PLAVIX) 75 MG tablet Take 1 tablet (75 mg total) by mouth daily.  30 tablet  5  . DISCONTD: lisinopril (PRINIVIL,ZESTRIL) 10 MG tablet Take 1 tablet (10 mg total) by mouth daily.  30 tablet  6    Allergies  Allergen Reactions  . Codeine   . Hydromorphone Hcl   .  Sulfa Drugs Cross Reactors     Patient Active Problem List  Diagnosis  . CVA  . STRESS FRACTURE, FOOT  . TIA (transient ischemic attack)  . Hypertension  . Ventricular hypertrophy  . Tobacco abuse    History  Smoking status  . Passive Smoke Exposure - Never Smoker  . Types: Cigarettes  Smokeless tobacco  . Current User    History  Alcohol Use No    Family History  Problem Relation Age of Onset  . Emphysema Father   . Aneurysm Sister     Review of Systems: Constitutional: no fever chills diaphoresis or fatigue or change in weight.  Head and neck: no hearing loss, no epistaxis, no photophobia or visual disturbance. Respiratory: No cough, shortness of breath or wheezing. Cardiovascular: No chest pain peripheral edema, palpitations. Gastrointestinal: No abdominal distention, no abdominal pain, no change in bowel habits hematochezia or melena. Genitourinary: No dysuria, no frequency, no urgency, no nocturia. Musculoskeletal:No arthralgias, no back pain, no gait disturbance or myalgias. Neurological: No dizziness, no headaches, no numbness, no seizures, no syncope, no weakness, no tremors. Hematologic: No lymphadenopathy, no easy bruising. Psychiatric: No confusion, no hallucinations, no sleep disturbance.    Physical Exam: Filed Vitals:  11/13/11 1100  BP: 166/80  Pulse: 49   The general appearance reveals a well-developed well-nourished middle-age woman in no distress.The head and neck exam reveals pupils equal and reactive.  Extraocular movements are full.  There is no scleral icterus.  The mouth and pharynx are normal.  The neck is supple.  The carotids reveal no bruits.  The jugular venous pressure is normal.  The  thyroid is not enlarged.  There is no lymphadenopathy.  The chest is clear to percussion and auscultation.  There are no rales or rhonchi.  Expansion of the chest is symmetrical.  The precordium is quiet.  The first heart sound is normal.  The second  heart sound is physiologically split.  There is no murmur gallop rub or click.  There is no abnormal lift or heave.  The abdomen is soft and nontender.  The bowel sounds are normal.  The liver and spleen are not enlarged.  There are no abdominal masses.  There are no abdominal bruits.  Extremities reveal good pedal pulses.  There is no phlebitis or edema.  There is no cyanosis or clubbing.  Strength is normal and symmetrical in all extremities.  There is no lateralizing weakness.  There are no sensory deficits.  The skin is warm and dry.  There is no rash.  EKG shows sinus bradycardia and anterolateral T wave inversion unchanged since 2010  Assessment / Plan:  Increase lisinopril to 10 mg daily.  Continue other medicines as is and be rechecked in 4 months

## 2011-11-13 NOTE — Patient Instructions (Addendum)
Increase your Lisinopril to 10 mg daily (had only been taking 1/2 tablet daily)  Your physician recommends that you schedule a follow-up appointment in: 4 months

## 2011-11-13 NOTE — Assessment & Plan Note (Signed)
The patient has not been experiencing any chest pain or angina.  Her electrocardiogram today is unchanged from 11/30/08 with persistent anterolateral T wave inversion which is chronic for her

## 2011-12-22 ENCOUNTER — Other Ambulatory Visit: Payer: Self-pay | Admitting: *Deleted

## 2011-12-22 MED ORDER — DIGOXIN 250 MCG PO TABS: 250.0000 ug | ORAL_TABLET | Freq: Every day | ORAL | Status: DC

## 2012-03-18 ENCOUNTER — Ambulatory Visit: Payer: Medicare Other | Admitting: Cardiology

## 2012-04-22 ENCOUNTER — Other Ambulatory Visit: Payer: Self-pay | Admitting: *Deleted

## 2012-04-22 MED ORDER — METOPROLOL SUCCINATE ER 50 MG PO TB24: ORAL_TABLET | ORAL | Status: DC

## 2012-04-26 ENCOUNTER — Other Ambulatory Visit: Payer: Medicare Other

## 2012-04-29 ENCOUNTER — Ambulatory Visit: Payer: Medicare Other | Admitting: Cardiology

## 2012-04-29 ENCOUNTER — Other Ambulatory Visit: Payer: Medicare Other

## 2012-05-06 ENCOUNTER — Encounter: Payer: Self-pay | Admitting: Cardiology

## 2012-05-06 ENCOUNTER — Ambulatory Visit (INDEPENDENT_AMBULATORY_CARE_PROVIDER_SITE_OTHER): Payer: Medicare Other | Admitting: Cardiology

## 2012-05-06 ENCOUNTER — Other Ambulatory Visit (INDEPENDENT_AMBULATORY_CARE_PROVIDER_SITE_OTHER): Payer: Medicare Other

## 2012-05-06 VITALS — BP 142/90 | HR 60 | Ht 62.0 in | Wt 142.4 lb

## 2012-05-06 DIAGNOSIS — G459 Transient cerebral ischemic attack, unspecified: Secondary | ICD-10-CM

## 2012-05-06 DIAGNOSIS — I119 Hypertensive heart disease without heart failure: Secondary | ICD-10-CM

## 2012-05-06 DIAGNOSIS — I259 Chronic ischemic heart disease, unspecified: Secondary | ICD-10-CM

## 2012-05-06 DIAGNOSIS — I10 Essential (primary) hypertension: Secondary | ICD-10-CM

## 2012-05-06 LAB — HEPATIC FUNCTION PANEL
ALT: 23 U/L (ref 0–35)
Bilirubin, Direct: 0.2 mg/dL (ref 0.0–0.3)
Total Bilirubin: 1 mg/dL (ref 0.3–1.2)
Total Protein: 7.2 g/dL (ref 6.0–8.3)

## 2012-05-06 LAB — BASIC METABOLIC PANEL
BUN: 9 mg/dL (ref 6–23)
Chloride: 104 mEq/L (ref 96–112)
GFR: 114.31 mL/min (ref >60.00)
Glucose, Bld: 87 mg/dL (ref 70–99)
Potassium: 4.1 mEq/L (ref 3.5–5.1)
Sodium: 137 mEq/L (ref 135–145)

## 2012-05-06 LAB — LIPID PANEL
HDL: 36.2 mg/dL — ABNORMAL LOW (ref >39.00)
LDL Cholesterol: 84 mg/dL (ref 0–99)
VLDL: 25.6 mg/dL (ref 0.0–40.0)

## 2012-05-06 NOTE — Assessment & Plan Note (Signed)
The patient has not had any recurrent TIA symptoms.  She has not been aware of any tachycardia or palpitations.  However when she switched from Brand name Lanoxin to digoxin she did have recurrence of some palpitations which subsided when she switched back to Brand name Lanoxin.

## 2012-05-06 NOTE — Patient Instructions (Addendum)
Will obtain labs today and call you with the results (lp/bmet/hfp)  Start Mucinex 600 mg twice a day  Your physician recommends that you continue on your current medications as directed. Please refer to the Current Medication list given to you today.  Your physician wants you to follow-up in: 6 month ov/ekg You will receive a reminder letter in the mail two months in advance. If you don't receive a letter, please call our office to schedule the follow-up appointment.

## 2012-05-06 NOTE — Progress Notes (Signed)
Jeanne Lawson Date of Birth:  [birth year] [facility] [address] [place], [place]  [address] [phone]         Fax   [phone]  History of Present Illness: This pleasant 67 year old woman is seen for a scheduled followup office visit.  She has a past history of a prior stroke. She's had problems with high blood pressure in the past. She has a history of prior coronary artery disease and 16 years ago Dr. Ty Hilts did cardiac catheterization and angioplasty but no stent. We don't have those records. The patient has had several subsequent Cardiolite stress tests which have been normal but have been poorly tolerated by the patient and she does not want any further nuclear stress tests. She does have a chronically abnormal EKG with anterolateral T wave inversion. Since last visit she's been doing well with no new cardiac symptoms. The date of her stroke was October 2010.  Since last visit she has done well until recently when she's had some increased cough secondary to the pollen in the spring.  Unfortunately she still smokes about 4 cigarettes a day also.  We talked about the importance of quitting altogether.   Current Outpatient Prescriptions  Medication Sig Dispense Refill  . aspirin 81 MG tablet Take 81 mg by mouth daily. Taking every other day      . clopidogrel (PLAVIX) 75 MG tablet Take 1 tablet (75 mg total) by mouth daily.  30 tablet  11  . diazepam (VALIUM) 5 MG tablet Take 5 mg by mouth every 6 (six) hours as needed.        . digoxin (LANOXIN) 0.25 MG tablet Take 1 tablet (250 mcg total) by mouth daily.  30 tablet  9  . guaiFENesin (MUCINEX) 600 MG 12 hr tablet Take 600 mg by mouth 2 (two) times daily as needed for congestion.      Marland Kitchen lisinopril (PRINIVIL,ZESTRIL) 10 MG tablet Take 1 tablet (10 mg total) by mouth daily.  30 tablet  11  . metoprolol succinate (TOPROL-XL) 50 MG 24 hr tablet Take 1/2 tablet twice a day  30 tablet  6  . potassium chloride  (KLOR-CON) 10 MEQ CR tablet Take 10 mEq by mouth as needed.         No current facility-administered medications for this visit.    Allergies  Allergen Reactions  . Codeine   . Hydromorphone Hcl   . Sulfa Drugs Cross Reactors     Patient Active Problem List  Diagnosis  . CVA  . STRESS FRACTURE, FOOT  . TIA (transient ischemic attack)  . Hypertension  . Ventricular hypertrophy  . Tobacco abuse  . Chronic ischemic heart disease    History  Smoking status  . Passive Smoke Exposure - Never Smoker  . Types: Cigarettes  Smokeless tobacco  . Current User    Comment: 3-4 cigs a day    History  Alcohol Use No    Family History  Problem Relation Age of Onset  . Emphysema Father   . Aneurysm Sister     Review of Systems: Constitutional: no fever chills diaphoresis or fatigue or change in weight.  Head and neck: no hearing loss, no epistaxis, no photophobia or visual disturbance. Respiratory: No cough, shortness of breath or wheezing. Cardiovascular: No chest pain peripheral edema, palpitations. Gastrointestinal: No abdominal distention, no abdominal pain, no change in bowel habits hematochezia or melena. Genitourinary: No dysuria, no frequency, no urgency, no nocturia. Musculoskeletal:No arthralgias, no back  pain, no gait disturbance or myalgias. Neurological: No dizziness, no headaches, no numbness, no seizures, no syncope, no weakness, no tremors. Hematologic: No lymphadenopathy, no easy bruising. Psychiatric: No confusion, no hallucinations, no sleep disturbance.    Physical Exam: Filed Vitals:   05/06/12 1413  BP: 142/90  Pulse: 60   the general appearance reveals a well-developed well-nourished woman in no distress.The head and neck exam reveals pupils equal and reactive.  Extraocular movements are full.  There is no scleral icterus.  The mouth and pharynx are normal.  The neck is supple.  The carotids reveal no bruits.  The jugular venous pressure is normal.   The  thyroid is not enlarged.  There is no lymphadenopathy.  The chest is clear to percussion and auscultation.  There are no rales or rhonchi.  Expansion of the chest is symmetrical.  The precordium is quiet.  The first heart sound is normal.  The second heart sound is physiologically split.  There is no murmur gallop rub or click.  There is no abnormal lift or heave.  The abdomen is soft and nontender.  The bowel sounds are normal.  The liver and spleen are not enlarged.  There are no abdominal masses.  There are no abdominal bruits.  Extremities reveal good pedal pulses.  There is no phlebitis or edema.  There is no cyanosis or clubbing.  Strength is normal and symmetrical in all extremities.  There is no lateralizing weakness.  There are no sensory deficits.  The skin is warm and dry.  There is no rash.     Assessment / Plan:  Any same medication.  Trial of Mucinex for cough.  Counseled to stop smoking.  Recheck in 6 months for followup office visit and EKG.

## 2012-05-06 NOTE — Progress Notes (Signed)
Quick Note:  Please report to patient. The recent labs are stable. Continue same medication and careful diet. ______ 

## 2012-05-06 NOTE — Assessment & Plan Note (Signed)
Blood pressure is borderline high today she attributes this to having a hectic day and also having a bothersome cough from her allergies.  We will have her try some Mucinex 600 mg twice a day for help with her cough.

## 2012-05-06 NOTE — Assessment & Plan Note (Addendum)
The patient has not had any recurrent chest pain or angina.  We are checking fasting lab work today

## 2012-05-07 ENCOUNTER — Other Ambulatory Visit: Payer: Medicare Other

## 2012-05-07 ENCOUNTER — Telehealth: Payer: Self-pay | Admitting: *Deleted

## 2012-05-07 NOTE — Telephone Encounter (Signed)
Message copied by Burnell Blanks on Tue May 07, 2012 10:41 AM ------      Message from: Cassell Clement      Created: Mon May 06, 2012 10:11 PM       Please report to patient.  The recent labs are stable. Continue same medication and careful diet. ------

## 2012-05-07 NOTE — Telephone Encounter (Signed)
Advised patient of lab results  

## 2012-05-28 ENCOUNTER — Ambulatory Visit: Payer: Medicare Other | Admitting: Cardiology

## 2012-06-03 ENCOUNTER — Other Ambulatory Visit: Payer: Self-pay | Admitting: *Deleted

## 2012-09-25 ENCOUNTER — Encounter: Payer: Self-pay | Admitting: Cardiology

## 2012-10-16 ENCOUNTER — Other Ambulatory Visit: Payer: Self-pay

## 2012-10-16 MED ORDER — DIGOXIN 250 MCG PO TABS: 250.0000 ug | ORAL_TABLET | Freq: Every day | ORAL | Status: DC

## 2012-11-11 ENCOUNTER — Ambulatory Visit (INDEPENDENT_AMBULATORY_CARE_PROVIDER_SITE_OTHER): Payer: Medicare Other | Admitting: Cardiology

## 2012-11-11 ENCOUNTER — Encounter: Payer: Self-pay | Admitting: Cardiology

## 2012-11-11 ENCOUNTER — Encounter (INDEPENDENT_AMBULATORY_CARE_PROVIDER_SITE_OTHER): Payer: Self-pay

## 2012-11-11 VITALS — BP 160/90 | Ht 61.0 in | Wt 142.0 lb

## 2012-11-11 DIAGNOSIS — G459 Transient cerebral ischemic attack, unspecified: Secondary | ICD-10-CM

## 2012-11-11 DIAGNOSIS — F17200 Nicotine dependence, unspecified, uncomplicated: Secondary | ICD-10-CM

## 2012-11-11 DIAGNOSIS — I119 Hypertensive heart disease without heart failure: Secondary | ICD-10-CM

## 2012-11-11 DIAGNOSIS — I259 Chronic ischemic heart disease, unspecified: Secondary | ICD-10-CM

## 2012-11-11 DIAGNOSIS — Z720 Tobacco use: Secondary | ICD-10-CM

## 2012-11-11 NOTE — Assessment & Plan Note (Signed)
The patient has not had any further TIA symptoms or stroke symptoms.

## 2012-11-11 NOTE — Assessment & Plan Note (Signed)
The patient has not been experiencing any recurrent chest pain or angina. 

## 2012-11-11 NOTE — Progress Notes (Signed)
Jeanne Lawson Date of Birth:  May 23, 1945 [address] [place], [place]  [address] [phone]         Fax   [phone]  History of Present Illness: This pleasant 67 year old woman is seen for a scheduled followup office visit.  She has a past history of a prior stroke. She's had problems with high blood pressure in the past. She has a history of prior coronary artery disease and 16 years ago Dr. Ty Hilts did cardiac catheterization and angioplasty but no stent. We don't have those records. The patient has had several subsequent Cardiolite stress tests which have been normal but have been poorly tolerated by the patient and she does not want any further nuclear stress tests. She does have a chronically abnormal EKG with anterolateral T wave inversion. Since last visit she's been doing well with no new cardiac symptoms. The date of her stroke was October 2010.  Since last visit she has done well until recently when she's had some increased cough secondary to the pollen in the spring.  Unfortunately she still smokes about 4 cigarettes a day also.  We talked about the importance of quitting altogether.  She has a history of chronic edema of the feet secondary to old frostbite injury to her feet as a child.   Current Outpatient Prescriptions  Medication Sig Dispense Refill  . aspirin 81 MG tablet Take 81 mg by mouth daily. Taking every other day      . clopidogrel (PLAVIX) 75 MG tablet Take 1 tablet (75 mg total) by mouth daily.  30 tablet  11  . digoxin (LANOXIN) 0.25 MG tablet Take 1 tablet (250 mcg total) by mouth daily.  30 tablet  9  . lisinopril (PRINIVIL,ZESTRIL) 10 MG tablet Take 1 tablet (10 mg total) by mouth daily.  30 tablet  11  . metoprolol succinate (TOPROL-XL) 50 MG 24 hr tablet Take 1/2 tablet twice a day  30 tablet  6  . diazepam (VALIUM) 5 MG tablet Take 5 mg by mouth every 6 (six) hours as needed.        Marland Kitchen guaiFENesin (MUCINEX) 600 MG 12 hr tablet Take 600 mg  by mouth 2 (two) times daily as needed for congestion.      . potassium chloride (KLOR-CON) 10 MEQ CR tablet Take 10 mEq by mouth as needed.         No current facility-administered medications for this visit.    Allergies  Allergen Reactions  . Codeine   . Hydromorphone Hcl   . Sulfa Drugs Cross Reactors     Patient Active Problem List   Diagnosis Date Noted  . STRESS FRACTURE, FOOT 02/22/2010    Priority: Medium  . 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 Tobacco abuse 10/24/2010  . TIA (transient ischemic attack)   . Hypertension   . Ventricular hypertrophy   . CVA 02/23/2010    History  Smoking status  . Passive Smoke Exposure - Never Smoker  . Types: Cigarettes  Smokeless tobacco  . Current User    Comment: 3-4 cigs a day    History  Alcohol Use No    Family History  Problem Relation Age of Onset  . Emphysema Father   . Aneurysm Sister     Review of Systems: Constitutional: no fever chills diaphoresis or fatigue or change in weight.  Head and neck: no hearing loss, no epistaxis, no photophobia or visual disturbance. Respiratory: No cough, shortness of breath or  wheezing. Cardiovascular: No chest pain peripheral edema, palpitations. Gastrointestinal: No abdominal distention, no abdominal pain, no change in bowel habits hematochezia or melena. Genitourinary: No dysuria, no frequency, no urgency, no nocturia. Musculoskeletal:No arthralgias, no back pain, no gait disturbance or myalgias. Neurological: No dizziness, no headaches, no numbness, no seizures, no syncope, no weakness, no tremors. Hematologic: No lymphadenopathy, no easy bruising. Psychiatric: No confusion, no hallucinations, no sleep disturbance.    Physical Exam: Filed Vitals:   11/11/12 0939  BP: 160/90   the general appearance reveals a well-developed well-nourished woman in no distress.The head and neck exam reveals pupils equal and reactive.  Extraocular movements are full.  There is  no scleral icterus.  The mouth and pharynx are normal.  The neck is supple.  The carotids reveal no bruits.  The jugular venous pressure is normal.  The  thyroid is not enlarged.  There is no lymphadenopathy.  The chest is clear to percussion and auscultation.  There are no rales or rhonchi.  Expansion of the chest is symmetrical.  The precordium is quiet.  The first heart sound is normal.  The second heart sound is physiologically split.  There is no murmur gallop rub or click.  There is no abnormal lift or heave.  The abdomen is soft and nontender.  The bowel sounds are normal.  The liver and spleen are not enlarged.  There are no abdominal masses.  There are no abdominal bruits.  Extremities reveal good pedal pulses.  There is no phlebitis or edema.  There is no cyanosis or clubbing.  Strength is normal and symmetrical in all extremities.  There is no lateralizing weakness.  There are no sensory deficits.  The skin is warm and dry.  There is no rash.  EKG today shows sinus bradycardia with wide spread deep T wave inversion in the inferolateral leads, not significantly changed since previous tracing of 11/13/11   Assessment / Plan:  Overall the patient is doing well.  No evidence of any recurrent angina.  No recent pulmonary symptoms despite smoking. She will continue same medication.  Counseled to stop smoking.  Recheck 6 months for followup office visit.  She brought in her recent lab work from her PCP which is satisfactory.  Her LDL is 91 and she will work harder on careful Mediterranean diet

## 2012-11-11 NOTE — Patient Instructions (Signed)
Your physician recommends that you continue on your current medications as directed. Please refer to the Current Medication list given to you today.  Your physician wants you to follow-up in: 6 month ov You will receive a reminder letter in the mail two months in advance. If you don't receive a letter, please call our office to schedule the follow-up appointment.  

## 2012-11-11 NOTE — Assessment & Plan Note (Signed)
Patient continues to smoke a few cigarettes each day against advice.  She denies cough or sputum production or pleurisy

## 2012-11-15 ENCOUNTER — Other Ambulatory Visit: Payer: Self-pay

## 2012-11-15 MED ORDER — CLOPIDOGREL BISULFATE 75 MG PO TABS: 75.0000 mg | ORAL_TABLET | Freq: Every day | ORAL | Status: DC

## 2012-11-15 MED ORDER — METOPROLOL SUCCINATE ER 50 MG PO TB24: ORAL_TABLET | ORAL | Status: DC

## 2012-11-20 ENCOUNTER — Ambulatory Visit: Payer: Medicare Other | Attending: Internal Medicine | Admitting: Physical Therapy

## 2012-11-20 DIAGNOSIS — IMO0001 Reserved for inherently not codable concepts without codable children: Secondary | ICD-10-CM | POA: Insufficient documentation

## 2012-11-20 DIAGNOSIS — M2560 Stiffness of unspecified joint, not elsewhere classified: Secondary | ICD-10-CM | POA: Insufficient documentation

## 2012-11-20 DIAGNOSIS — R269 Unspecified abnormalities of gait and mobility: Secondary | ICD-10-CM | POA: Insufficient documentation

## 2012-11-27 ENCOUNTER — Ambulatory Visit: Payer: Medicare Other | Attending: Internal Medicine | Admitting: Physical Therapy

## 2012-11-27 DIAGNOSIS — IMO0001 Reserved for inherently not codable concepts without codable children: Secondary | ICD-10-CM | POA: Insufficient documentation

## 2012-11-27 DIAGNOSIS — M2560 Stiffness of unspecified joint, not elsewhere classified: Secondary | ICD-10-CM | POA: Insufficient documentation

## 2012-11-27 DIAGNOSIS — R269 Unspecified abnormalities of gait and mobility: Secondary | ICD-10-CM | POA: Insufficient documentation

## 2012-12-04 ENCOUNTER — Ambulatory Visit: Payer: Medicare Other | Admitting: Physical Therapy

## 2012-12-09 ENCOUNTER — Ambulatory Visit: Payer: Medicare Other | Admitting: Physical Therapy

## 2012-12-16 ENCOUNTER — Ambulatory Visit: Payer: Medicare Other | Admitting: Physical Therapy

## 2012-12-24 ENCOUNTER — Ambulatory Visit: Payer: Medicare Other | Attending: Internal Medicine | Admitting: Physical Therapy

## 2012-12-24 DIAGNOSIS — M2560 Stiffness of unspecified joint, not elsewhere classified: Secondary | ICD-10-CM | POA: Insufficient documentation

## 2012-12-24 DIAGNOSIS — IMO0001 Reserved for inherently not codable concepts without codable children: Secondary | ICD-10-CM | POA: Insufficient documentation

## 2012-12-24 DIAGNOSIS — R269 Unspecified abnormalities of gait and mobility: Secondary | ICD-10-CM | POA: Insufficient documentation

## 2012-12-30 ENCOUNTER — Ambulatory Visit: Payer: Medicare Other | Admitting: Physical Therapy

## 2013-01-06 ENCOUNTER — Ambulatory Visit: Payer: Medicare Other | Admitting: Physical Therapy

## 2013-01-20 ENCOUNTER — Ambulatory Visit: Payer: Medicare Other | Admitting: Physical Therapy

## 2013-01-28 ENCOUNTER — Ambulatory Visit: Payer: Medicare Other | Admitting: Physical Therapy

## 2013-01-30 ENCOUNTER — Ambulatory Visit: Payer: Medicare Other | Admitting: Physical Therapy

## 2013-02-11 ENCOUNTER — Ambulatory Visit: Payer: Medicare Other | Attending: Internal Medicine | Admitting: Physical Therapy

## 2013-02-11 DIAGNOSIS — R269 Unspecified abnormalities of gait and mobility: Secondary | ICD-10-CM | POA: Diagnosis not present

## 2013-02-11 DIAGNOSIS — M2560 Stiffness of unspecified joint, not elsewhere classified: Secondary | ICD-10-CM | POA: Insufficient documentation

## 2013-02-11 DIAGNOSIS — IMO0001 Reserved for inherently not codable concepts without codable children: Secondary | ICD-10-CM | POA: Insufficient documentation

## 2013-02-20 ENCOUNTER — Ambulatory Visit: Payer: Medicare Other | Admitting: Physical Therapy

## 2013-02-20 DIAGNOSIS — IMO0001 Reserved for inherently not codable concepts without codable children: Secondary | ICD-10-CM | POA: Diagnosis not present

## 2013-03-04 ENCOUNTER — Other Ambulatory Visit: Payer: Self-pay

## 2013-03-04 DIAGNOSIS — I119 Hypertensive heart disease without heart failure: Secondary | ICD-10-CM

## 2013-03-04 MED ORDER — LISINOPRIL 10 MG PO TABS: 10.0000 mg | ORAL_TABLET | Freq: Every day | ORAL | Status: DC

## 2013-03-17 ENCOUNTER — Ambulatory Visit: Payer: Medicare Other | Admitting: Physical Therapy

## 2013-03-20 ENCOUNTER — Ambulatory Visit: Payer: Medicare Other | Admitting: Physical Therapy

## 2013-05-13 ENCOUNTER — Encounter: Payer: Self-pay | Admitting: Cardiology

## 2013-05-13 ENCOUNTER — Ambulatory Visit (INDEPENDENT_AMBULATORY_CARE_PROVIDER_SITE_OTHER): Payer: Medicare Other | Admitting: Cardiology

## 2013-05-13 VITALS — BP 166/86 | HR 66 | Ht 61.0 in | Wt 141.0 lb

## 2013-05-13 DIAGNOSIS — G459 Transient cerebral ischemic attack, unspecified: Secondary | ICD-10-CM

## 2013-05-13 DIAGNOSIS — Z720 Tobacco use: Secondary | ICD-10-CM

## 2013-05-13 DIAGNOSIS — F17200 Nicotine dependence, unspecified, uncomplicated: Secondary | ICD-10-CM

## 2013-05-13 DIAGNOSIS — I259 Chronic ischemic heart disease, unspecified: Secondary | ICD-10-CM

## 2013-05-13 DIAGNOSIS — I10 Essential (primary) hypertension: Secondary | ICD-10-CM

## 2013-05-13 DIAGNOSIS — I119 Hypertensive heart disease without heart failure: Secondary | ICD-10-CM

## 2013-05-13 LAB — BASIC METABOLIC PANEL
BUN: 10 mg/dL (ref 6–23)
CO2: 32 mEq/L (ref 19–32)
CREATININE: 0.6 mg/dL (ref 0.4–1.2)
Calcium: 9.6 mg/dL (ref 8.4–10.5)
Chloride: 101 mEq/L (ref 96–112)
GFR: 105.25 mL/min (ref >60.00)
GLUCOSE: 96 mg/dL (ref 70–99)
POTASSIUM: 4.1 meq/L (ref 3.5–5.1)
Sodium: 140 mEq/L (ref 135–145)

## 2013-05-13 MED ORDER — LISINOPRIL 20 MG PO TABS: 20.0000 mg | ORAL_TABLET | Freq: Every day | ORAL | Status: DC

## 2013-05-13 MED ORDER — CLOPIDOGREL BISULFATE 75 MG PO TABS: 75.0000 mg | ORAL_TABLET | Freq: Every day | ORAL | Status: DC

## 2013-05-13 NOTE — Progress Notes (Signed)
[name] Date of Birth:  11-Aug-1945 [address] [place], [place]  [address] [phone]         Fax   [phone]  History of Present Illness: This pleasant 68 year old woman is seen for a scheduled followup office visit.  She has a past history of a prior stroke. She's had problems with high blood pressure in the past. She has a history of prior coronary artery disease and 16 years ago Dr. Ilda Foil did cardiac catheterization and angioplasty but no stent. We don't have those records. The patient has had several subsequent Cardiolite stress tests which have been normal but have been poorly tolerated by the patient and she does not want any further nuclear stress tests. She does have a chronically abnormal EKG with anterolateral T wave inversion. Since last visit she's been doing well with no new cardiac symptoms. The date of her stroke was October 2010.  She recently had a course of physical therapy from [facility] and she has felt significant improvement in her left leg and arm since the therapy..  She has a history of chronic edema of the feet secondary to old frostbite injury to her feet as a child.   Current Outpatient Prescriptions  Medication Sig Dispense Refill  . aspirin 81 MG tablet Take 81 mg by mouth daily. Taking every other day      . clopidogrel (PLAVIX) 75 MG tablet Take 1 tablet (75 mg total) by mouth daily.  30 tablet  5  . digoxin (LANOXIN) 0.25 MG tablet Take 250 mcg by mouth daily. NAME BRAND ONLY      . lisinopril (PRINIVIL,ZESTRIL) 20 MG tablet Take 1 tablet (20 mg total) by mouth daily.  30 tablet  5  . metoprolol succinate (TOPROL-XL) 50 MG 24 hr tablet Take 1/2 tablet twice a day - NAME BRAND ONLY      . diazepam (VALIUM) 5 MG tablet Take 5 mg by mouth every 6 (six) hours as needed.         No current facility-administered medications for this visit.    Allergies  Allergen Reactions  . Codeine   . Hydromorphone Hcl   . Sulfa Drugs  Cross Reactors     Patient Active Problem List   Diagnosis Date Noted  . STRESS FRACTURE, FOOT 02/22/2010    Priority: Medium  . 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 Tobacco abuse 10/24/2010  . TIA (transient ischemic attack)   . Hypertension   . Ventricular hypertrophy   . CVA 02/23/2010    History  Smoking status  . Passive Smoke Exposure - Never Smoker  . Types: Cigarettes  Smokeless tobacco  . Current User    Comment: 3-4 cigs a day    History  Alcohol Use No    Family History  Problem Relation Age of Onset  . Emphysema Father   . Aneurysm Sister     Review of Systems: Constitutional: no fever chills diaphoresis or fatigue or change in weight.  Head and neck: no hearing loss, no epistaxis, no photophobia or visual disturbance. Respiratory: No cough, shortness of breath or wheezing. Cardiovascular: No chest pain peripheral edema, palpitations. Gastrointestinal: No abdominal distention, no abdominal pain, no change in bowel habits hematochezia or melena. Genitourinary: No dysuria, no frequency, no urgency, no nocturia. Musculoskeletal:No arthralgias, no back pain, no gait disturbance or myalgias. Neurological: No dizziness, no headaches, no numbness, no seizures, no syncope, no weakness, no tremors. Hematologic: No lymphadenopathy, no  easy bruising. Psychiatric: No confusion, no hallucinations, no sleep disturbance.    Physical Exam: Filed Vitals:   05/13/13 1442  BP: 166/86  Pulse: 66   the general appearance reveals a well-developed well-nourished woman in no distress.The head and neck exam reveals pupils equal and reactive.  Extraocular movements are full.  There is no scleral icterus.  The mouth and pharynx are normal.  The neck is supple.  The carotids reveal no bruits.  The jugular venous pressure is normal.  The  thyroid is not enlarged.  There is no lymphadenopathy.  The chest is clear to percussion and auscultation.  There are no rales or  rhonchi.  Expansion of the chest is symmetrical.  The precordium is quiet.  The first heart sound is normal.  The second heart sound is physiologically split.  There is no murmur gallop rub or click.  There is no abnormal lift or heave.  The abdomen is soft and nontender.  The bowel sounds are normal.  The liver and spleen are not enlarged.  There are no abdominal masses.  There are no abdominal bruits.  Extremities reveal good pedal pulses.  There is no phlebitis or edema.  There is no cyanosis or clubbing.  Strength is normal and symmetrical in all extremities.  There is no lateralizing weakness.  There are no sensory deficits.  The skin is warm and dry.  There is no rash.     Assessment / Plan:  The patient is to continue same medication except increase lisinopril.  Recheck in 6 months for followup office visit.  Counseled on cessation of smoking.

## 2013-05-13 NOTE — Assessment & Plan Note (Signed)
The patient is still smoking 3 or 4 cigarettes a day outside.  She did not smoke as much during the winter because it was too cold to go outside to smoke.  Today we talked about the importance of quitting altogether.

## 2013-05-13 NOTE — Assessment & Plan Note (Signed)
The patient has had no recurrent chest pain or angina 

## 2013-05-13 NOTE — Assessment & Plan Note (Signed)
Her blood pressure today was high.  We rechecked it twice and it was still in the range of 160/90.  We will increase her lisinopril from 10 mg up to 20 mg daily.  We are checking a basal metabolic panel today.  She has not been having any symptoms from her blood pressures as a headache or dizziness

## 2013-05-13 NOTE — Patient Instructions (Signed)
Will obtain labs today and call you with the results (bmet)  INCREASE YOUR LISINOPRIL TO 20 MG DAILY  Your physician wants you to follow-up in: 6 month ov You will receive a reminder letter in the mail two months in advance. If you don't receive a letter, please call our office to schedule the follow-up appointment.

## 2013-05-13 NOTE — Assessment & Plan Note (Addendum)
The patient has had no further TIA or stroke symptoms.  She is on a baby aspirin daily.

## 2013-05-14 ENCOUNTER — Telehealth: Payer: Self-pay | Admitting: *Deleted

## 2013-05-14 NOTE — Telephone Encounter (Signed)
Advised patient of lab results  

## 2013-05-14 NOTE — Progress Notes (Signed)
Quick Note:  Please report to patient. The recent labs are stable. Continue same medication and careful diet. ______ 

## 2013-05-14 NOTE — Telephone Encounter (Signed)
Message copied by Earvin Hansen on Wed May 14, 2013 11:57 AM ------      Message from: Darlin Coco      Created: Wed May 14, 2013  7:51 AM       Please report to patient.  The recent labs are stable. Continue same medication and careful diet. ------

## 2013-06-17 ENCOUNTER — Other Ambulatory Visit: Payer: Self-pay | Admitting: Cardiology

## 2013-09-02 ENCOUNTER — Telehealth: Payer: Self-pay | Admitting: Cardiology

## 2013-09-02 DIAGNOSIS — I119 Hypertensive heart disease without heart failure: Secondary | ICD-10-CM

## 2013-09-02 NOTE — Telephone Encounter (Signed)
New problem:    Pt would like to countiue take LISINORIL 10 mg please give pt a call back about her medications please.

## 2013-09-02 NOTE — Telephone Encounter (Signed)
Left message for patient to call back  

## 2013-09-04 NOTE — Telephone Encounter (Signed)
Patient would like to go back to Lisinopril 10 mg daily, feels like the 20 mg is too strong. She states that she was unable to function on that strength and feels great on 10 mg tablets. Patient would like Rx for 10 mg tablet to be sent to pharmacy. Will forward to  Dr. Mare Ferrari for review

## 2013-09-05 ENCOUNTER — Other Ambulatory Visit: Payer: Self-pay | Admitting: Cardiology

## 2013-09-05 ENCOUNTER — Telehealth: Payer: Self-pay | Admitting: Cardiology

## 2013-09-05 DIAGNOSIS — I119 Hypertensive heart disease without heart failure: Secondary | ICD-10-CM

## 2013-09-05 MED ORDER — LISINOPRIL 10 MG PO TABS: 10.0000 mg | ORAL_TABLET | Freq: Every day | ORAL | Status: DC

## 2013-09-05 MED ORDER — LISINOPRIL 10 MG PO TABS: 20.0000 mg | ORAL_TABLET | Freq: Every day | ORAL | Status: DC

## 2013-09-05 NOTE — Telephone Encounter (Signed)
CALLED PHARMACY AND CORRECTED DOSE OF RX TO 10  MG ONE TABLET DAILY

## 2013-09-05 NOTE — Telephone Encounter (Signed)
Patient aware.

## 2013-09-05 NOTE — Telephone Encounter (Signed)
Okay to switch back to the 10 mg lisinopril.

## 2013-09-05 NOTE — Telephone Encounter (Signed)
Spoke with Colletta Maryland and clarified 10 mg daily

## 2013-09-05 NOTE — Telephone Encounter (Signed)
New Prob    Requesting clarification on directions for lisinopril (PRINIVIL,ZESTRIL) 10 MG tablet. Please call.

## 2013-11-03 ENCOUNTER — Ambulatory Visit: Payer: Medicare Other | Attending: Internal Medicine | Admitting: Physical Therapy

## 2013-11-03 DIAGNOSIS — M6281 Muscle weakness (generalized): Secondary | ICD-10-CM | POA: Diagnosis not present

## 2013-11-03 DIAGNOSIS — R278 Other lack of coordination: Secondary | ICD-10-CM | POA: Insufficient documentation

## 2013-11-03 DIAGNOSIS — Z8673 Personal history of transient ischemic attack (TIA), and cerebral infarction without residual deficits: Secondary | ICD-10-CM | POA: Diagnosis not present

## 2013-11-03 DIAGNOSIS — Z5189 Encounter for other specified aftercare: Secondary | ICD-10-CM | POA: Diagnosis present

## 2013-11-03 DIAGNOSIS — R269 Unspecified abnormalities of gait and mobility: Secondary | ICD-10-CM | POA: Diagnosis not present

## 2013-11-11 ENCOUNTER — Ambulatory Visit: Payer: Medicare Other | Admitting: Physical Therapy

## 2013-11-11 DIAGNOSIS — Z5189 Encounter for other specified aftercare: Secondary | ICD-10-CM | POA: Diagnosis not present

## 2013-11-14 ENCOUNTER — Other Ambulatory Visit: Payer: Self-pay | Admitting: Cardiology

## 2013-11-17 ENCOUNTER — Ambulatory Visit: Payer: Medicare Other | Admitting: Physical Therapy

## 2013-11-17 DIAGNOSIS — Z5189 Encounter for other specified aftercare: Secondary | ICD-10-CM | POA: Diagnosis not present

## 2013-11-18 ENCOUNTER — Encounter: Payer: Self-pay | Admitting: Cardiology

## 2013-11-18 ENCOUNTER — Ambulatory Visit (INDEPENDENT_AMBULATORY_CARE_PROVIDER_SITE_OTHER): Payer: Medicare Other | Admitting: Cardiology

## 2013-11-18 VITALS — BP 170/106 | HR 64 | Ht 61.0 in | Wt 142.0 lb

## 2013-11-18 DIAGNOSIS — I119 Hypertensive heart disease without heart failure: Secondary | ICD-10-CM

## 2013-11-18 DIAGNOSIS — Z720 Tobacco use: Secondary | ICD-10-CM

## 2013-11-18 DIAGNOSIS — I10 Essential (primary) hypertension: Secondary | ICD-10-CM

## 2013-11-18 DIAGNOSIS — I259 Chronic ischemic heart disease, unspecified: Secondary | ICD-10-CM

## 2013-11-18 NOTE — Progress Notes (Signed)
Jeanne Lawson Date of Birth:  [birth year] [address] [place], [place]  [address] [phone]         Fax   [phone]  History of Present Illness: This pleasant 68 year old woman is seen for a scheduled followup office visit.  She has a past history of a prior stroke. She's had problems with high blood pressure in the past. She has a history of prior coronary artery disease and 16 years ago Dr. Ilda Foil did cardiac catheterization and angioplasty but no stent. We don't have those records. The patient has had several subsequent Cardiolite stress tests which have been normal but have been poorly tolerated by the patient and she does not want any further nuclear stress tests. She does have a chronically abnormal EKG with anterolateral T wave inversion. Since last visit she's been doing well with no new cardiac symptoms. The date of her stroke was October 2010.  She recently had a course of physical therapy from  and she has felt significant improvement in her left leg and arm since the therapy..  She has a history of chronic edema of the feet secondary to old frostbite injury to her feet as a child.   Current Outpatient Prescriptions  Medication Sig Dispense Refill  . aspirin 81 MG tablet Take 81 mg by mouth daily. Taking every other day      . clopidogrel (PLAVIX) 75 MG tablet Take 1 tablet (75 mg total) by mouth daily.  30 tablet  5  . diazepam (VALIUM) 5 MG tablet Take 5 mg by mouth every 6 (six) hours as needed.        Marland Kitchen LANOXIN 250 MCG tablet TAKE 1 TABLET BY MOUTH DAILY.  30 tablet  4  . lisinopril (PRINIVIL,ZESTRIL) 10 MG tablet Take 1 tablet (10 mg total) by mouth daily.  30 tablet  5  . TOPROL XL 50 MG 24 hr tablet TAKE 1/2 TABLET BY MOUTH TWICE DAILY.  30 tablet  1   No current facility-administered medications for this visit.    Allergies  Allergen Reactions  . Dilaudid [Hydromorphone]   . Codeine   . Hydromorphone Hcl   . Sulfa Drugs Cross  Reactors     Patient Active Problem List   Diagnosis Date Noted  . STRESS FRACTURE, FOOT 02/22/2010    Priority: Medium  . 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 Tobacco abuse 10/24/2010  . TIA (transient ischemic attack)   . Hypertension   . Ventricular hypertrophy   . CVA 02/23/2010    History  Smoking status  . Passive Smoke Exposure - Never Smoker  . Types: Cigarettes  Smokeless tobacco  . Current User    Comment: 3-4 cigs a day    History  Alcohol Use No    Family History  Problem Relation Age of Onset  . Emphysema Father   . Aneurysm Sister     Review of Systems: Constitutional: no fever chills diaphoresis or fatigue or change in weight.  Head and neck: no hearing loss, no epistaxis, no photophobia or visual disturbance. Respiratory: No cough, shortness of breath or wheezing. Cardiovascular: No chest pain peripheral edema, palpitations. Gastrointestinal: No abdominal distention, no abdominal pain, no change in bowel habits hematochezia or melena. Genitourinary: No dysuria, no frequency, no urgency, no nocturia. Musculoskeletal:No arthralgias, no back pain, no gait disturbance or myalgias. Neurological: No dizziness, no headaches, no numbness, no seizures, no syncope, no weakness, no tremors. Hematologic: No lymphadenopathy, no easy  bruising. Psychiatric: No confusion, no hallucinations, no sleep disturbance.    Physical Exam: Filed Vitals:   11/18/13 0842  BP: 170/106  Pulse: 64   the general appearance reveals a well-developed well-nourished woman in no distress.The head and neck exam reveals pupils equal and reactive.  Extraocular movements are full.  There is no scleral icterus.  The mouth and pharynx are normal.  The neck is supple.  The carotids reveal no bruits.  The jugular venous pressure is normal.  The  thyroid is not enlarged.  There is no lymphadenopathy.  The chest is clear to percussion and auscultation.  There are no rales or rhonchi.   Expansion of the chest is symmetrical.  The precordium is quiet.  The first heart sound is normal.  The second heart sound is physiologically split.  There is no murmur gallop rub or click.  There is no abnormal lift or heave.  The abdomen is soft and nontender.  The bowel sounds are normal.  The liver and spleen are not enlarged.  There are no abdominal masses.  There are no abdominal bruits.  Extremities reveal good pedal pulses.  There is no phlebitis or edema.  There is no cyanosis or clubbing.  Strength is normal and symmetrical in all extremities.  There is no lateralizing weakness.  There are no sensory deficits.  The skin is warm and dry.  There is no rash.  EKG shows normal sinus rhythm and lateral ST and T-wave abnormalities improved from 11/11/12   Assessment / Plan: 1. 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 2. hypertensive heart disease without heart failure 3. 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 tobacco abuse The patient is to continue same medication except increase lisinopril.  Recheck in 6 months for followup office visit.  Counseled on cessation of smoking. Recheck in 6 months for followup office visit

## 2013-11-18 NOTE — Assessment & Plan Note (Signed)
The patient has a history of an old CVA with residual left leg weakness.  She has benefited in the past from physical therapy and is getting more physical therapy now which is again helping her.

## 2013-11-18 NOTE — Assessment & Plan Note (Signed)
She still smokes 4 or 5 cigarettes a day against in place.  She does not smoke in the house.  She goes outside.

## 2013-11-18 NOTE — Assessment & Plan Note (Signed)
Blood pressure here in the office is elevated.  However she insists that her blood pressure at home has been normal.  She states that in the past and we tried to increase her lisinopril she was unable to tolerate.  We'll continue current medication and she will continue to monitor closely at home

## 2013-11-18 NOTE — Patient Instructions (Signed)
Your physician recommends that you continue on your current medications as directed. Please refer to the Current Medication list given to you today.  Your physician wants you to follow-up in: 6 month ov You will receive a reminder letter in the mail two months in advance. If you don't receive a letter, please call our office to schedule the follow-up appointment.  

## 2013-11-18 NOTE — Assessment & Plan Note (Signed)
The patient has not been having any recurrent chest pain or angina.  Her EKG today shows slight improvement in lateral T wave inversion

## 2013-11-24 ENCOUNTER — Ambulatory Visit: Payer: Medicare Other | Admitting: Physical Therapy

## 2013-12-01 ENCOUNTER — Encounter: Payer: Self-pay | Admitting: Physical Therapy

## 2013-12-01 ENCOUNTER — Ambulatory Visit: Payer: Medicare Other | Attending: Internal Medicine | Admitting: Physical Therapy

## 2013-12-01 DIAGNOSIS — M6281 Muscle weakness (generalized): Secondary | ICD-10-CM | POA: Diagnosis not present

## 2013-12-01 DIAGNOSIS — I69393 Ataxia following cerebral infarction: Secondary | ICD-10-CM | POA: Insufficient documentation

## 2013-12-01 DIAGNOSIS — R269 Unspecified abnormalities of gait and mobility: Secondary | ICD-10-CM

## 2013-12-01 DIAGNOSIS — R278 Other lack of coordination: Secondary | ICD-10-CM | POA: Diagnosis not present

## 2013-12-01 DIAGNOSIS — Z8673 Personal history of transient ischemic attack (TIA), and cerebral infarction without residual deficits: Secondary | ICD-10-CM | POA: Diagnosis not present

## 2013-12-01 DIAGNOSIS — I69354 Hemiplegia and hemiparesis following cerebral infarction affecting left non-dominant side: Secondary | ICD-10-CM | POA: Insufficient documentation

## 2013-12-01 DIAGNOSIS — Z5189 Encounter for other specified aftercare: Secondary | ICD-10-CM | POA: Diagnosis present

## 2013-12-01 NOTE — Therapy (Signed)
Physical Therapy Treatment  Patient Details  Name: Jeanne Lawson MRN: [id number] Date of Birth: 27-Jan-1945  Encounter Date: 12/01/2013      PT End of Session - 12/01/13 1058    Visit Number 4   Number of Visits 9   Date for PT Re-Evaluation 12/10/13   PT Start Time 0932   PT Stop Time 1027   PT Time Calculation (min) 55 min      Past Medical History  Diagnosis Date  . Ventricular hypertrophy 04/2009    LVH with diastolic dysfunction by echo. Has normal EF.  Marland Kitchen Pancreatitis     x2  . PAF (paroxysmal atrial fibrillation)   . TIA (transient ischemic attack)     She was hospitalized 04-23-09 through 04-27-09 for involving right side of the body  . 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 Hypertension   . 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  . Tobacco abuse     Ongoing   . Edema of foot     She has a history of chronic edema of the left dated back to age 15 when she suufered severe frostbite playing  on the snow as a child  . Coronary artery disease 1996    Known with prior mild lesion of LAD demonstrated by Cardiac Catheterization in 1996    Past Surgical History  Procedure Laterality Date  . 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 Neck surgery  50 yrs ago    Left side  . Cardiac catheterization  1996    Mild CAD with vasospasm  . US echocardiography  04-26-2009    EF 65-70%  . Cardiovascular stress test  12-02-2001    EF 70%    There were no vitals taken for this visit.  Visit Diagnosis:  Abnormality of gait  Left-sided muscle weakness          [facility] Adult PT Treatment/Exercise - 12/01/13 1041    Ambulation/Gait   Stairs Yes   Stairs Assistance 5: Supervision   Stairs Assistance Details (indicate cue type and reason) cues for sequence   Stair Management Technique One rail Left   Number of Stairs 4    Static Standing Balance   Single Leg Stance - Left Leg 10   Dynamic Standing Balance   Dynamic Standing - Balance Support No upper extremity supported   Dynamic Standing - Level of Assistance 5: Stand by assistance   Dynamic Standing - Balance Activities Rocker board  tapping cones with CGA   Dynamic Standing - Comments tipping cones over and back upright with UE support prn:  standing on blue foam with LUE shoulder fleixon 2# weight x 10reps for core stabilization   High Level Balance   High Level Balance Comments Quadriped - lifting opposite arm and leg 3 reps each side with 5 sec hold.    Knee/Hip Exercises: Aerobic   Stationary Bike 5 level 1.4   Knee/Hip Exercises: Supine   Bridges 5 sets  bridging with hip abdct/adduction x 5 reps   Other Supine Knee Exercises left hip extension control off side of mat x10 reps;  left knee to chest with extension for control x 10 reps;  left hip abduction in sidelying x 10  reps; left Pilates exercise - abdct.  with clockwise & conuterclockwise x 5 reps each    Other Supine Knee Exercises left hamstring and left heel cord stretches in standing x 30 sec hold; prostretch for L heel cord stretch x 30 secs hold;; left step ups x 10 reps ; step down exericse 10 reps            PT Short Term Goals - 12/01/13 1216    PT SHORT TERM GOAL #1   Title Report at least 20% improvement in LLE flexibility and ease of movement   Time 1   Period Weeks   Status On-going   PT SHORT TERM GOAL #2   Title Incr. L grip strength to >/= 33# for incr. ease with lifting/carrying items   Time 1   Period Weeks   Status On-going   PT SHORT TERM GOAL #3   Title Negotiate steps with 1 rail using a step over step sequence.   Time 1  12-10-13   Period Weeks   Status On-going   PT SHORT TERM GOAL #4   Title Incr. gait velocity to >/= 3.4 ft/sec with cane for incr. gait efficiency.   Time 1   Period Weeks   Status On-going   PT SHORT TERM GOAL #5   Title  Independent in HEP for LUE and LLE strengthening exercises.   Time 1   Period Weeks   Status On-going          PT Long Term Goals - 12/01/13 1118    PT LONG TERM GOAL #1   Title Verbalize understanding of health promotion/wellness opportunities   Time 4  01-02-14   Period Weeks   Status On-going   PT LONG TERM GOAL #2   Title Report at least 30% improvement in LLE flexibility and ease of movement   Time 4  01-02-14   Period Weeks   Status On-going   PT LONG TERM GOAL #3   Title Incr. L grip strength to >/= 35# for incr. ease with carrying items   Time 4  01-02-14   Period Weeks   Status On-going   PT LONG TERM GOAL #4   Title Incr. gait velocity to >/= 3.6 ft/sec with cane for incr. gait efficiency.   Time 4  01-02-14   Period Weeks   Status On-going   PT LONG TERM GOAL #5   Title Amb. 500' with cane without LOB on flat, even surface modified independently   Time 4  01-02-14   Period Weeks   Status On-going          Plan - 12/01/13 1059    Clinical Impression Statement pt. is progressing with improving LLE single limb stance and LUE strength;  pt. exhibits decreased coordination and ataxia in LUE, especially noted with LUE abdct. with elbow flexed - involuntary movement noted  with lifting left arm to left side of head   Pt will benefit from skilled therapeutic intervention in order to improve on the following deficits Abnormal gait;Decreased range of motion;Decreased balance;Decreased strength;Impaired flexibility   Rehab Potential Good   PT Frequency 1x / week   PT Duration 4 weeks   PT Treatment/Interventions Therapeutic activities;Patient/family education;Therapeutic exercise;Gait training;Balance training;Neuromuscular re-education;Stair training   PT Next Visit Plan check 4 week STG's; continue LLE strengthening exercises   PT Home Exercise Plan as instructed in previous sessions   Consulted and Agree with Plan of Care Patient        Problem  List Patient Active Problem List  Diagnosis Date Noted  . 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 Tobacco abuse 10/24/2010  . TIA (transient ischemic attack)   . Hypertension   . Ventricular hypertrophy   . CVA 02/23/2010  . STRESS FRACTURE, FOOT 02/22/2010                                            Alda Lea 12/01/2013, 12:27 PM

## 2013-12-10 ENCOUNTER — Ambulatory Visit: Payer: Medicare Other | Admitting: Physical Therapy

## 2013-12-23 ENCOUNTER — Ambulatory Visit: Payer: Medicare Other | Attending: Internal Medicine | Admitting: Physical Therapy

## 2013-12-23 ENCOUNTER — Encounter: Payer: Self-pay | Admitting: Physical Therapy

## 2013-12-23 DIAGNOSIS — M6281 Muscle weakness (generalized): Secondary | ICD-10-CM

## 2013-12-23 DIAGNOSIS — R269 Unspecified abnormalities of gait and mobility: Secondary | ICD-10-CM | POA: Diagnosis not present

## 2013-12-23 DIAGNOSIS — Z5189 Encounter for other specified aftercare: Secondary | ICD-10-CM | POA: Insufficient documentation

## 2013-12-23 DIAGNOSIS — Z8673 Personal history of transient ischemic attack (TIA), and cerebral infarction without residual deficits: Secondary | ICD-10-CM | POA: Insufficient documentation

## 2013-12-23 DIAGNOSIS — R278 Other lack of coordination: Secondary | ICD-10-CM | POA: Diagnosis not present

## 2013-12-23 NOTE — Therapy (Signed)
[facility] [address] [place], [place], [address] Phone: [phone]   Fax:  [phone]  Physical Therapy Treatment  Patient Details  Name: Jeanne Lawson MRN: [id number] Date of Birth: 1945/10/06  Encounter Date: 12/23/2013      PT End of Session - 12/23/13 2004    Visit Number 5   Number of Visits 9   PT Start Time 2458   PT Stop Time 1020   PT Time Calculation (min) 46 min      Past Medical History  Diagnosis Date  . Ventricular hypertrophy 04/2009    LVH with diastolic dysfunction by echo. Has normal EF.  Marland Kitchen Pancreatitis     x2  . PAF (paroxysmal atrial fibrillation)   . TIA (transient ischemic attack)     She was hospitalized 04-23-09 through 04-27-09 for involving right side of the body  . 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 Hypertension   . 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  . Tobacco abuse     Ongoing   . Edema of foot     She has a history of chronic edema of the left dated back to age 47 when she suufered severe frostbite playing  on the snow as a child  . Coronary artery disease 1996    Known with prior mild lesion of LAD demonstrated by Cardiac Catheterization in 1996    Past Surgical History  Procedure Laterality Date  . 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 Neck surgery  50 yrs ago    Left side  . Cardiac catheterization  1996    Mild CAD with vasospasm  . US echocardiography  04-26-2009    EF 65-70%  . Cardiovascular stress test  12-02-2001    EF 70%    There were no vitals taken for this visit.  Visit Diagnosis:  Left-sided muscle weakness      Subjective Assessment - 12/23/13 1951    Symptoms Pt. states she was busy over holiday week - used left arm and hand alot and did alot of walking   Currently in Pain? No/denies             [facility] Adult PT Treatment/Exercise - 12/23/13 0946    Ambulation/Gait   Ambulation/Gait Yes   Ambulation/Gait Assistance 6: Modified independent (Device/Increase time)   Ambulation Distance (Feet) 100 Feet   Assistive device None   Gait Pattern Decreased dorsiflexion - left;Decreased hip/knee flexion - left   Stairs Yes   Stairs Assistance 6: Modified independent (Device/Increase time)   Stair Management Technique One rail Left   Number of Stairs 4   Lumbar Exercises: Stretches   Active Hamstring Stretch 30 seconds  LLE   Lumbar Exercises: Aerobic   Stationary Bike Scifit level 1.5 5"   Elliptical 1" level 1.1 forward; 1" backward   Lumbar Exercises: Standing   Heel Raises 10 reps  LLE   Heel Raises Limitations weakness in left plantarflexors   Other Standing Lumbar Exercises left hip extension and hip abduction with green therabnad x 10 reps each    Lumbar Exercises: Supine   Bridge 10 reps   Other Supine Lumbar Exercises bridging with hip abdct/adduction x 5 reps; bridging with RLE extension x 5 reps; bridging with  marching x 5 reps    Pt. Performed left SLR x 10 reps; left knee to chest with extension x 10 reps; L 1/2 bridging x 10 reps Gait velocity 1.86 ft/sec with cane Left grip strength 31# (average of 3 trials) Pt. Ambulated 30' on heels and 30' on tip toes for L plantarflexor and dorsiflexor strengthening      PT Short Term Goals - 12/23/13 2005    PT SHORT TERM GOAL #1   Title Report at least 20% improvement in LLE flexibility and ease of movement   Status Partially Met  pt reports inconsistent due to weather   PT SHORT TERM GOAL #2   Title Incr. L grip strength to >/= 33# for incr. ease with lifting/carrying items   Baseline grip strength 31#   Status On-going   PT SHORT TERM GOAL #3   Title Negotiate steps with 1 rail using a step over step sequence.   Status Achieved   PT SHORT TERM GOAL #4   Title Incr. gait velocity to >/= 3.4 ft/sec with  cane for incr. gait efficiency.   Baseline 1.86 ft/sec   Status Not Met            Plan - 12/23/13 2009    Clinical Impression Statement pt. has met 2 STG's; 1 STG partially met due to report of fluctuation in status at times due to weather; left grip strength has increased from 28# to 31#   Pt will benefit from skilled therapeutic intervention in order to improve on the following deficits Abnormal gait;Decreased range of motion;Decreased balance;Decreased strength;Impaired flexibility   Rehab Potential Good   PT Frequency 1x / week   PT Duration 4 weeks   PT Treatment/Interventions Therapeutic activities;Patient/family education;Therapeutic exercise;Gait training;Balance training;Neuromuscular re-education;Stair training   PT Next Visit Plan continue LLE strengthening   PT Home Exercise Plan as instructed in previous sessions   Consulted and Agree with Plan of Care Patient                               Problem List Patient Active Problem List   Diagnosis Date Noted  . 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 Tobacco abuse 10/24/2010  . TIA (transient ischemic attack)   . Hypertension   . Ventricular hypertrophy   . CVA 02/23/2010  . STRESS FRACTURE, FOOT 02/22/2010   Guido Sander, [place] [address], [place], [place] [address] [phone]  Alda Lea 12/23/2013, 8:13 PM

## 2013-12-30 ENCOUNTER — Encounter: Payer: Self-pay | Admitting: Physical Therapy

## 2013-12-30 ENCOUNTER — Ambulatory Visit: Payer: Medicare Other | Admitting: Physical Therapy

## 2013-12-30 DIAGNOSIS — M6281 Muscle weakness (generalized): Secondary | ICD-10-CM

## 2013-12-30 DIAGNOSIS — Z5189 Encounter for other specified aftercare: Secondary | ICD-10-CM | POA: Diagnosis not present

## 2013-12-30 DIAGNOSIS — R269 Unspecified abnormalities of gait and mobility: Secondary | ICD-10-CM

## 2013-12-30 NOTE — Therapy (Signed)
[facility] [address] [place], [place], [address] Phone: [phone]   Fax:  [phone]  Physical Therapy Treatment  Patient Details  Name: Jeanne Lawson MRN: [id number] Date of Birth: [birth year]  Encounter Date: 12/30/2013      PT End of Session - 12/30/13 2009    Visit Number 6   Number of Visits 9   Date for PT Re-Evaluation 01/02/14   PT Start Time 0755   PT Stop Time 0845   PT Time Calculation (min) 50 min      Past Medical History  Diagnosis Date  . Ventricular hypertrophy 04/2009    LVH with diastolic dysfunction by echo. Has normal EF.  Marland Kitchen Pancreatitis     x2  . PAF (paroxysmal atrial fibrillation)   . TIA (transient ischemic attack)     She was hospitalized 04-23-09 through 04-27-09 for involving right side of the body  . 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 Hypertension   . 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  . Tobacco abuse     Ongoing   . Edema of foot     She has a history of chronic edema of the left dated back to age 23 when she suufered severe frostbite playing  on the snow as a child  . Coronary artery disease 1996    Known with prior mild lesion of LAD demonstrated by Cardiac Catheterization in 1996    Past Surgical History  Procedure Laterality Date  . 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 Neck surgery  50 yrs ago    Left side  . Cardiac catheterization  1996    Mild CAD with vasospasm  . US echocardiography  04-26-2009    EF 65-70%  . Cardiovascular stress test  12-02-2001    EF 70%    There were no vitals taken for this visit.  Visit Diagnosis:  Left-sided muscle weakness - Plan: PT plan of care cert/re-cert  Abnormality of gait - Plan: PT plan of care cert/re-cert      Subjective Assessment - 12/30/13 1957    Symptoms Pt. reports doing better - able to walk better and feels that L leg strength is increasing   Currently in Pain? No/denies            [facility] Adult PT Treatment/Exercise - 12/30/13 0813    Ambulation/Gait   Ambulation/Gait Yes   Ambulation/Gait Assistance 6: Modified independent (Device/Increase time)   Ambulation Distance (Feet) 120 Feet   Assistive device None   Gait Pattern Decreased hip/knee flexion - left;Decreased dorsiflexion - left   Dynamic Standing Balance   Dynamic Standing - Balance Activities Step ups   Dynamic Standing - Comments pt. performed standing on floor with right foot on step and left foot on floor for increased weight-bearing while performing zoom ball for UE strengthening and AROM; directions included horizontal and diagonals x 10 reps each   Lumbar Exercises: Aerobic   Elliptical 1" forward; 1" backward   Lumbar Exercises: Machines for Strengthening   Leg Press 10 reps 45#; LLE only 10 reps 35#   Lumbar Exercises: Standing   Heel Raises 10 reps   Heel Raises Limitations weakness in left plantarflexors   Other Standing Lumbar Exercises L hip flexion,  abduction adn extension with 3# weight x 10 reps each   Knee/Hip Exercises: Seated   Stool Scoot - Round Trips 35' x 2 reps for L hamstring strengthening   Other Seated Knee Exercises ambulating 25' with flexed knee with feet plantarflexed              PT Long Term Goals - 12/30/13 2015    PT LONG TERM GOAL #1   Title Verbalize understanding of health promotion/wellness opportunities   Baseline met 12-30-13   Time 4   Period Weeks   Status Achieved   PT LONG TERM GOAL #2   Title Report at least 30% improvement in LLE flexibility and ease of movement   Baseline partially met due to edema affects flexibility and ease of movement  --  12-30-13   Time 4   Period Weeks   Status Partially Met   PT LONG TERM GOAL #3   Title Incr. L grip strength to >/= 35# for incr. ease with carrying items    Baseline assessed 12-23-13   Time 4   Period Weeks   Status On-going   PT LONG TERM GOAL #4   Title Incr. gait velocity to >/= 3.6 ft/sec with cane for incr. gait efficiency.   Baseline assessed 12-30-13 1.87 ft/sec   Time 4   Period Weeks   Status Not Met                                 Problem List Patient Active Problem List   Diagnosis Date Noted  . 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 Tobacco abuse 10/24/2010  . TIA (transient ischemic attack)   . Hypertension   . Ventricular hypertrophy   . CVA 02/23/2010  . STRESS FRACTURE, FOOT 02/22/2010    Lynann Demetrius, [place] 12/30/2013, 8:30 PM     [facility] [address], [place], [place] [address] [phone]

## 2014-01-05 ENCOUNTER — Ambulatory Visit: Payer: Medicare Other | Admitting: Physical Therapy

## 2014-01-05 DIAGNOSIS — M6281 Muscle weakness (generalized): Secondary | ICD-10-CM

## 2014-01-05 DIAGNOSIS — R269 Unspecified abnormalities of gait and mobility: Secondary | ICD-10-CM

## 2014-01-05 DIAGNOSIS — Z5189 Encounter for other specified aftercare: Secondary | ICD-10-CM | POA: Diagnosis not present

## 2014-01-06 ENCOUNTER — Encounter: Payer: Self-pay | Admitting: Physical Therapy

## 2014-01-06 NOTE — Therapy (Signed)
[facility] [address] [place], [place], [address] Phone: [phone]   Fax:  [phone]  Physical Therapy Treatment  Patient Details  Name: Jeanne Lawson MRN: [id number] Date of Birth: 1945/09/25  Encounter Date: 01/05/2014      PT End of Session - 01/06/14 0743    Visit Number 7   Number of Visits 10   Date for PT Re-Evaluation 01/30/14   PT Start Time 0845   PT Stop Time 0932   PT Time Calculation (min) 47 min      Past Medical History  Diagnosis Date  . Ventricular hypertrophy 04/2009    LVH with diastolic dysfunction by echo. Has normal EF.  Marland Kitchen Pancreatitis     x2  . PAF (paroxysmal atrial fibrillation)   . TIA (transient ischemic attack)     She was hospitalized 04-23-09 through 04-27-09 for involving right side of the body  . 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 Hypertension   . 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  . Tobacco abuse     Ongoing   . Edema of foot     She has a history of chronic edema of the left dated back to age 7 when she suufered severe frostbite playing  on the snow as a child  . Coronary artery disease 1996    Known with prior mild lesion of LAD demonstrated by Cardiac Catheterization in 1996    Past Surgical History  Procedure Laterality Date  . 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 Neck surgery  50 yrs ago    Left side  . Cardiac catheterization  1996    Mild CAD with vasospasm  . US echocardiography  04-26-2009    EF 65-70%  . Cardiovascular stress test  12-02-2001    EF 70%    There were no vitals taken for this visit.  Visit Diagnosis:  Left-sided muscle weakness  Abnormality of gait      Subjective Assessment - 01/06/14 0735    Symptoms "I'm doing better" Pt. reports being very tired after last week's  session   Currently in Pain? No/denies            [facility] Adult PT Treatment/Exercise - 01/06/14 0737    Dynamic Standing Balance   Dynamic Standing - Balance Activities Other (comment);Compliant surfaces  ;zoom ball (standing on floor)   Lumbar Exercises: Machines for Strengthening   Leg Press seat 12; bil. LE's 35# 20 reps; 25# LLE only 20 reps; 25# RLE only 20 reps   Lumbar Exercises: Standing   Heel Raises 10 reps   Knee/Hip Exercises: Seated   Other Seated Knee Exercises left knee flexion with green theraband x 10 reps   Ankle Exercises: Stretches   Other Stretch prostretch for LLE heel cord stretch x 30 sec hold     Step ups LLE x 10 reps; step downs LLE 10 reps Standing left hip and knee flexion with extension x 10 reps Standing on blue foam compliant surface for improved balance - with partial squats x 10 reps with minimal UE support Standing on Bosu on LLE for improved single limb stance - moving RLE forward, back and laterally x 10 reps         PT  Long Term Goals - 01/06/14 0744    PT LONG TERM GOAL #1   Title Independent in updated HEP for LLE strengthening and stretching   Baseline 01-29-14   Time 4   Status New   PT LONG TERM GOAL #2   Title Report at least 30% improvement in LLE flexibility and ease of movement   Baseline 01-29-14   Time 4   Period Weeks   Status On-going   PT LONG TERM GOAL #3   Title Incr. L grip strength to >/= 35# for incr. ease with carrying items   Baseline 01-29-14   Time 4   Period Weeks   Status On-going   PT LONG TERM GOAL #4   Title Incr. gait velocity to >/= 2.0 ft/sec with cane for incr. gait efficiency.   Baseline 01-29-14   Time 4   Period Weeks   Status New          Plan - 01/06/14 0749    Clinical Impression Statement pt. prorgressing well towards new updated LTG's   PT Frequency 1x / week   PT Duration 3 weeks   PT Next Visit Plan continue LLE strengthening   PT Home Exercise Plan as instructed in previous  sessions   Consulted and Agree with Plan of Care Patient                               Problem List Patient Active Problem List   Diagnosis Date Noted  . 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 Tobacco abuse 10/24/2010  . TIA (transient ischemic attack)   . Hypertension   . Ventricular hypertrophy   . CVA 02/23/2010  . STRESS FRACTURE, FOOT 02/22/2010    Alda Lea 01/06/2014, 7:53 AM   [facility] [address] [place], [place] [address] [phone]

## 2014-01-14 ENCOUNTER — Ambulatory Visit: Payer: Medicare Other | Admitting: Physical Therapy

## 2014-01-26 ENCOUNTER — Ambulatory Visit: Payer: Medicare Other | Attending: Internal Medicine | Admitting: Physical Therapy

## 2014-01-26 DIAGNOSIS — R278 Other lack of coordination: Secondary | ICD-10-CM | POA: Insufficient documentation

## 2014-01-26 DIAGNOSIS — Z8673 Personal history of transient ischemic attack (TIA), and cerebral infarction without residual deficits: Secondary | ICD-10-CM | POA: Insufficient documentation

## 2014-01-26 DIAGNOSIS — M6281 Muscle weakness (generalized): Secondary | ICD-10-CM | POA: Insufficient documentation

## 2014-01-26 DIAGNOSIS — R269 Unspecified abnormalities of gait and mobility: Secondary | ICD-10-CM | POA: Diagnosis not present

## 2014-01-26 DIAGNOSIS — Z5189 Encounter for other specified aftercare: Secondary | ICD-10-CM | POA: Diagnosis not present

## 2014-01-27 ENCOUNTER — Encounter: Payer: Self-pay | Admitting: Physical Therapy

## 2014-01-27 NOTE — Therapy (Signed)
[facility] [address] [place], [place], [address] Phone: [phone]   Fax:  [phone]  Physical Therapy Treatment  Patient Details  Name: Jeanne Lawson MRN: [id number] Date of Birth: 05-23-1945  Encounter Date: 01/26/2014      PT End of Session - 01/26/14 1326    Visit Number 8   Number of Visits 10   Date for PT Re-Evaluation 01/30/14   PT Start Time 0800   PT Stop Time 0845   PT Time Calculation (min) 45 min      Past Medical History  Diagnosis Date  . Ventricular hypertrophy 04/2009    LVH with diastolic dysfunction by echo. Has normal EF.  Marland Kitchen Pancreatitis     x2  . PAF (paroxysmal atrial fibrillation)   . TIA (transient ischemic attack)     She was hospitalized 04-23-09 through 04-27-09 for involving right side of the body  . 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 Hypertension   . 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  . Tobacco abuse     Ongoing   . Edema of foot     She has a history of chronic edema of the left dated back to age 69 when she suufered severe frostbite playing  on the snow as a child  . Coronary artery disease 1996    Known with prior mild lesion of LAD demonstrated by Cardiac Catheterization in 1996    Past Surgical History  Procedure Laterality Date  . 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 Neck surgery  50 yrs ago    Left side  . Cardiac catheterization  1996    Mild CAD with vasospasm  . US echocardiography  04-26-2009    EF 65-70%  . Cardiovascular stress test  12-02-2001    EF 70%    There were no vitals taken for this visit.  Visit Diagnosis:  Left-sided muscle weakness      Subjective Assessment - 01/27/14 1321    Symptoms Pt. reports moving better and easier with therapy   Currently in Pain? No/denies                     [facility] Adult PT Treatment/Exercise - 01/27/14 1323    Dynamic Standing Balance   Dynamic Standing - Balance Activities Other (comment);Compliant surfaces  ;zoom ball (standing on floor)   Lumbar Exercises: Machines for Strengthening   Leg Press seat 12; bil. LE's 35# 20 reps; 25# LLE only 20 reps; 25# RLE only 20 reps   Lumbar Exercises: Standing   Heel Raises 10 reps   Ankle Exercises: Standing   Rocker Board 1 minute  with CGA   Braiding (Round Trip) inside bars with CGA   Other Standing Ankle Exercises heels raises 10 reps LLE     Ther Ex: standing L hip extension, abduction, flexion with green theraband x 10 reps; forward step ups LLE x 10 reps; step down  LLE x 10 reps for eccentric strengthening  Rockerboard with UE support prn with CGA; stepping over and back of balance beam with UE support prn with CGA             PT Short Term Goals - 12/23/13 2005  PT SHORT TERM GOAL #1   Title Report at least 20% improvement in LLE flexibility and ease of movement   Status Partially Met  pt reports inconsistent due to weather   PT SHORT TERM GOAL #2   Title Incr. L grip strength to >/= 33# for incr. ease with lifting/carrying items   Baseline grip strength 31#   Status On-going   PT SHORT TERM GOAL #3   Title Negotiate steps with 1 rail using a step over step sequence.   Status Achieved   PT SHORT TERM GOAL #4   Title Incr. gait velocity to >/= 3.4 ft/sec with cane for incr. gait efficiency.   Baseline 1.86 ft/sec   Status Not Met           PT Long Term Goals - 01/06/14 0744    PT LONG TERM GOAL #1   Title Independent in updated HEP for LLE strengthening and stretching   Baseline 01-29-14   Time 4   Status New   PT LONG TERM GOAL #2   Title Report at least 30% improvement in LLE flexibility and ease of movement   Baseline 01-29-14   Time 4   Period Weeks   Status On-going   PT LONG TERM GOAL #3   Title Incr. L grip strength to  >/= 35# for incr. ease with carrying items   Baseline 01-29-14   Time 4   Period Weeks   Status On-going   PT LONG TERM GOAL #4   Title Incr. gait velocity to >/= 2.0 ft/sec with cane for incr. gait efficiency.   Baseline 01-29-14   Time 4   Period Weeks   Status New               Plan - 01/27/14 1327    Clinical Impression Statement pt. progressing towards LTG's - cont. to have decr. dorsiflexion LLE  at times   Pt will benefit from skilled therapeutic intervention in order to improve on the following deficits Abnormal gait;Decreased range of motion;Decreased balance;Decreased strength;Impaired flexibility   Rehab Potential Good   PT Frequency 1x / week   PT Duration 2 weeks   PT Treatment/Interventions Therapeutic activities;Patient/family education;Therapeutic exercise;Gait training;Balance training;Neuromuscular re-education;Stair training   PT Next Visit Plan ; check LTG's and D/C   PT Home Exercise Plan as instructed in previous sessions   Consulted and Agree with Plan of Care Patient        Problem List Patient Active Problem List   Diagnosis Date Noted  . 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 Tobacco abuse 10/24/2010  . TIA (transient ischemic attack)   . Hypertension   . Ventricular hypertrophy   . CVA 02/23/2010  . STRESS FRACTURE, FOOT 02/22/2010    Alda Lea, PT 01/27/2014, 1:30 PM  [facility] [address] [place], [place], [address] Phone: [phone]   Fax:  [phone]

## 2014-02-03 ENCOUNTER — Other Ambulatory Visit: Payer: Self-pay | Admitting: Cardiology

## 2014-04-21 ENCOUNTER — Telehealth: Payer: Self-pay | Admitting: Cardiology

## 2014-04-21 NOTE — Telephone Encounter (Signed)
Spoke with patient and recommended contacting PCP Ok to use Mucinex plain as needed

## 2014-04-21 NOTE — Telephone Encounter (Signed)
New message     Pt says she is coughing, runny nose, watery eyes, phlegm in throat.  Can Dr Mare Ferrari presc something?  [facility] is her drug store

## 2014-04-22 ENCOUNTER — Emergency Department (HOSPITAL_COMMUNITY): Payer: Medicare Other

## 2014-04-22 ENCOUNTER — Emergency Department (HOSPITAL_COMMUNITY)
Admission: EM | Admit: 2014-04-22 | Discharge: 2014-04-22 | Disposition: A | Discharge status: Home / Self Care | Payer: Medicare Other | Attending: Emergency Medicine | Admitting: Emergency Medicine

## 2014-04-22 ENCOUNTER — Encounter (HOSPITAL_COMMUNITY): Payer: Self-pay

## 2014-04-22 DIAGNOSIS — I2510 Atherosclerotic heart disease of native coronary artery without angina pectoris: Secondary | ICD-10-CM | POA: Diagnosis not present

## 2014-04-22 DIAGNOSIS — Z79899 Other long term (current) drug therapy: Secondary | ICD-10-CM | POA: Diagnosis not present

## 2014-04-22 DIAGNOSIS — R062 Wheezing: Secondary | ICD-10-CM | POA: Diagnosis not present

## 2014-04-22 DIAGNOSIS — R05 Cough: Secondary | ICD-10-CM | POA: Insufficient documentation

## 2014-04-22 DIAGNOSIS — Z7982 Long term (current) use of aspirin: Secondary | ICD-10-CM | POA: Diagnosis not present

## 2014-04-22 DIAGNOSIS — Z8673 Personal history of transient ischemic attack (TIA), and cerebral infarction without residual deficits: Secondary | ICD-10-CM | POA: Diagnosis not present

## 2014-04-22 DIAGNOSIS — Z9889 Other specified postprocedural states: Secondary | ICD-10-CM | POA: Diagnosis not present

## 2014-04-22 DIAGNOSIS — Z8719 Personal history of other diseases of the digestive system: Secondary | ICD-10-CM | POA: Insufficient documentation

## 2014-04-22 DIAGNOSIS — I480 Paroxysmal atrial fibrillation: Secondary | ICD-10-CM | POA: Insufficient documentation

## 2014-04-22 DIAGNOSIS — Z7902 Long term (current) use of antithrombotics/antiplatelets: Secondary | ICD-10-CM | POA: Diagnosis not present

## 2014-04-22 DIAGNOSIS — I10 Essential (primary) hypertension: Secondary | ICD-10-CM | POA: Diagnosis not present

## 2014-04-22 DIAGNOSIS — R059 Cough, unspecified: Secondary | ICD-10-CM

## 2014-04-22 DIAGNOSIS — R0602 Shortness of breath: Secondary | ICD-10-CM | POA: Diagnosis not present

## 2014-04-22 LAB — CBC WITH DIFFERENTIAL/PLATELET
BASOS ABS: 0 10*3/uL (ref 0.0–0.1)
BASOS PCT: 0 % (ref 0–1)
Eosinophils Absolute: 0 10*3/uL (ref 0.0–0.7)
Eosinophils Relative: 1 % (ref 0–5)
HCT: 49.8 % — ABNORMAL HIGH (ref 36.0–46.0)
Hemoglobin: 14 g/dL (ref 12.0–15.0)
LYMPHS ABS: 1 10*3/uL (ref 0.7–4.0)
LYMPHS PCT: 16 % (ref 12–46)
MCH: 29.7 pg (ref 26.0–34.0)
MCHC: 28.1 g/dL — ABNORMAL LOW (ref 30.0–36.0)
MCV: 105.7 fL — AB (ref 78.0–100.0)
Monocytes Absolute: 0.6 10*3/uL (ref 0.1–1.0)
Monocytes Relative: 9 % (ref 3–12)
NEUTROS ABS: 4.7 10*3/uL (ref 1.7–7.7)
NEUTROS PCT: 74 % (ref 43–77)
Platelets: 128 10*3/uL — ABNORMAL LOW (ref 150–400)
RBC: 4.71 MIL/uL (ref 3.87–5.11)
RDW: 16.7 % — AB (ref 11.5–15.5)
WBC: 6.3 10*3/uL (ref 4.0–10.5)

## 2014-04-22 LAB — BASIC METABOLIC PANEL
ANION GAP: 10 (ref 5–15)
BUN: 12 mg/dL (ref 6–23)
CALCIUM: 8.7 mg/dL (ref 8.4–10.5)
CO2: 26 mmol/L (ref 19–32)
Chloride: 102 mmol/L (ref 96–112)
Creatinine, Ser: 0.64 mg/dL (ref 0.50–1.10)
GFR calc Af Amer: >90 mL/min (ref >90)
GFR, EST NON AFRICAN AMERICAN: 88 mL/min — AB (ref >90)
Glucose, Bld: 115 mg/dL — ABNORMAL HIGH (ref 70–99)
Potassium: 3.5 mmol/L (ref 3.5–5.1)
Sodium: 138 mmol/L (ref 135–145)

## 2014-04-22 LAB — I-STAT TROPONIN, ED: TROPONIN I, POC: 0 ng/mL (ref 0.00–0.08)

## 2014-04-22 MED ORDER — ALBUTEROL SULFATE HFA 108 (90 BASE) MCG/ACT IN AERS
1.0000 | INHALATION_SPRAY | RESPIRATORY_TRACT | Status: DC | PRN
  Administered 2014-04-22: 2 via RESPIRATORY_TRACT
  Filled 2014-04-22: qty 6.7

## 2014-04-22 MED ORDER — ALBUTEROL SULFATE (2.5 MG/3ML) 0.083% IN NEBU
5.0000 mg | INHALATION_SOLUTION | Freq: Once | RESPIRATORY_TRACT | Status: AC
  Administered 2014-04-22: 5 mg via RESPIRATORY_TRACT
  Filled 2014-04-22: qty 6

## 2014-04-22 NOTE — ED Notes (Signed)
Patient transported to X-ray 

## 2014-04-22 NOTE — Discharge Instructions (Signed)
Use albuterol inhaler as needed for wheezing/SOB. Also recommend to start a daily allergy medicine-- zyrtec, allegra, claritin, etc are all available over the counter.  Use caution with benadryl, it will make you drowsy. Follow-up with your primary care physician as needed. Return to the ED for new concerns.

## 2014-04-22 NOTE — ED Notes (Signed)
MD at bedside. 

## 2014-04-22 NOTE — ED Notes (Signed)
RT called for treatment

## 2014-04-22 NOTE — ED Notes (Addendum)
Patient reports she has been short of breath for 2 days.   Productive cough.

## 2014-04-22 NOTE — ED Provider Notes (Signed)
CSN: [id number]     Arrival date & time 04/22/14  0263 History   First MD Initiated Contact with Patient 04/22/14 0559     Chief Complaint  Patient presents with  . Shortness of Breath     (Consider location/radiation/quality/duration/timing/severity/associated sxs/prior Treatment) Patient is a 69 y.o. female presenting with shortness of breath. The history is provided by the patient and medical records.  Shortness of Breath Associated symptoms: cough     This is a 69 y.o. F with PMH significant for PAF, TIA's, prior CVA without residual deficit, HTN, CAD, presenting to the ED for SOB.  Patient was at the beach over the weekend and was exposed to a lot of pollen and other allergens.  States upon returning home she developed a productive cough with thick yellow sputum.  She notes subjective fever but denies chills or sweats.  States she has noticed that her breathing feels more labored intermittently, worse with exertion and bouts of coughing.  States some rib soreness bilaterally with coughing but denies chest pain.  No hx of asthma or COPD.  No intervention tried PTA.  Patient did call her PCP who called in amoxicillin for her, but she did not start this medication.  VSS.  Past Medical History  Diagnosis Date  . Ventricular hypertrophy 04/2009    LVH with diastolic dysfunction by echo. Has normal EF.  Marland Kitchen Pancreatitis     x2  . PAF (paroxysmal atrial fibrillation)   . TIA (transient ischemic attack)     She was hospitalized 04-23-09 through 04-27-09 for involving right side of the body  . 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 Hypertension   . 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  . Tobacco abuse     Ongoing   . Edema of foot     She has a history of chronic edema of the left dated back to age 11 when she suufered severe frostbite playing   on the snow as a child  . Coronary artery disease 1996    Known with prior mild lesion of LAD demonstrated by Cardiac Catheterization in 1996   Past Surgical History  Procedure Laterality Date  . 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 Neck surgery  50 yrs ago    Left side  . Cardiac catheterization  1996    Mild CAD with vasospasm  . US echocardiography  04-26-2009    EF 65-70%  . Cardiovascular stress test  12-02-2001    EF 70%   Family History  Problem Relation Age of Onset  . Emphysema Father   . Aneurysm Sister    History  Substance Use Topics  . Smoking status: Passive Smoke Exposure - Never Smoker    Types: Cigarettes  . Smokeless tobacco: Current User     Comment: 3-4 cigs a day  . Alcohol Use: No   OB History    No data available     Review of Systems  Respiratory: Positive for cough and shortness of breath.   All other systems reviewed and are negative.     Allergies  Dilaudid; Codeine; Hydromorphone hcl; and Sulfa drugs cross reactors  Home Medications   Prior to Admission medications   Medication Sig Start Date End Date Taking? Authorizing Provider  aspirin 81 MG tablet Take 81 mg by mouth every other day. Taking every other day   Yes Historical Provider, MD  clopidogrel (PLAVIX) 75 MG tablet Take 1 tablet (75 mg total) by mouth daily. 05/13/13  Yes Darlin Coco, MD  LANOXIN 250 MCG tablet TAKE 1 TABLET BY MOUTH DAILY. 11/14/13  Yes Darlin Coco, MD  lisinopril (PRINIVIL,ZESTRIL) 10 MG tablet Take 1 tablet (10 mg total) by mouth daily. Patient taking differently: Take 10 mg by mouth every other day.  09/05/13  Yes Darlin Coco, MD  TOPROL XL 50 MG 24 hr tablet TAKE 1/2 TABLET BY MOUTH TWICE DAILY. 02/04/14  Yes Darlin Coco, MD   BP 172/95 mmHg  Pulse 84  Temp(Src) 98.7 F (37.1 C) (Oral)  Resp 20  Ht 5\' 1"  (1.549 m)  Wt 135 lb (61.236 kg)  BMI 25.52 kg/m2  SpO2 95%   Physical Exam  Constitutional: She is oriented to  person, place, and time. She appears well-developed and well-nourished.  HENT:  Head: Normocephalic and atraumatic.  Mouth/Throat: Oropharynx is clear and moist.  Eyes: Conjunctivae and EOM are normal. Pupils are equal, round, and reactive to light.  Neck: Normal range of motion.  Cardiovascular: Normal rate, regular rhythm and normal heart sounds.   Pulmonary/Chest: Effort normal. She has wheezes.  No distress, scattered expiratory wheezes, speaking in full complete sentences without difficulty  Abdominal: Soft. Bowel sounds are normal.  Musculoskeletal: Normal range of motion.  Neurological: She is alert and oriented to person, place, and time.  Skin: Skin is warm and dry.  Psychiatric: She has a normal mood and affect.  Nursing note and vitals reviewed.   ED Course  Procedures (including critical care time) Labs Review Labs Reviewed  CBC WITH DIFFERENTIAL/PLATELET - Abnormal; Notable for the following:    HCT 49.8 (*)    MCV 105.7 (*)    MCHC 28.1 (*)    RDW 16.7 (*)    Platelets 128 (*)    All other components within normal limits  BASIC METABOLIC PANEL - Abnormal; Notable for the following:    Glucose, Bld 115 (*)    GFR calc non Af Amer 88 (*)    All other components within normal limits  I-STAT TROPOININ, ED    Imaging Review Dg Chest 2 View  04/22/2014   CLINICAL DATA:  Cough, congestion and shortness of breath worsening over 2 days. Occasional smoker.  EXAM: CHEST  2 VIEW  COMPARISON:  None.  FINDINGS: Cardiac silhouette is mildly enlarged, mediastinal silhouette is nonsuspicious torturous aorta. Mild chronic interstitial changes and increased lung volumes can be seen with COPD without pleural effusion or focal consolidation. Mild biapical pleural thickening. No pneumothorax. Soft tissue planes and included osseous structures nonsuspicious.  IMPRESSION: Mild cardiomegaly, no acute pulmonary process.   Electronically Signed   By: Elon Alas   On: 04/22/2014 06:52      EKG Interpretation None      MDM   Final diagnoses:  Cough  Shortness of breath   69 y.o. F with cough and intermittent SOB after returning home from the beach over the weekend.  Exposure to pollen and other allergens noted.  On exam, patient is not in any acute respiratory distress.  She does have scattered expiratory wheezes, O2 sats remain stable on RA.  Patient denies current chest pain, rib soreness with coughing only.  Will obtain labs, CXR.  Patient started on albuterol neb treatment.    Lab work reassuring.  CXR  clear without acute infiltrate.  After neb treatment lung sounds have cleared and patient states she is feeling better.  VS remain stable on RA.  Patient with likely season allergies with associated bronchospasm.  Will d/c home with albuterol inhaler.  Have also recommended to start daily allergy medicine for now.  FU with PCP.  Discussed plan with patient, he/she acknowledged understanding and agreed with plan of care.  Return precautions given for new or worsening symptoms.  Larene Pickett, PA-C 04/22/14 [place], MD 04/22/14 [phone]

## 2014-04-24 DIAGNOSIS — I639 Cerebral infarction, unspecified: Secondary | ICD-10-CM

## 2014-04-24 HISTORY — DX: Cerebral infarction, unspecified: I63.9

## 2014-05-02 ENCOUNTER — Emergency Department (HOSPITAL_COMMUNITY): Payer: Medicare Other

## 2014-05-02 ENCOUNTER — Encounter (HOSPITAL_COMMUNITY): Payer: Self-pay | Admitting: Emergency Medicine

## 2014-05-02 ENCOUNTER — Inpatient Hospital Stay (HOSPITAL_COMMUNITY)
Admission: EM | Admit: 2014-05-02 | Discharge: 2014-05-04 | DRG: 065 | Disposition: A | Discharge status: Home Health | Payer: Medicare Other | Attending: Internal Medicine | Admitting: Internal Medicine

## 2014-05-02 DIAGNOSIS — I729 Aneurysm of unspecified site: Secondary | ICD-10-CM

## 2014-05-02 DIAGNOSIS — I6789 Other cerebrovascular disease: Secondary | ICD-10-CM | POA: Diagnosis not present

## 2014-05-02 DIAGNOSIS — R4781 Slurred speech: Secondary | ICD-10-CM | POA: Diagnosis not present

## 2014-05-02 DIAGNOSIS — F17210 Nicotine dependence, cigarettes, uncomplicated: Secondary | ICD-10-CM | POA: Diagnosis present

## 2014-05-02 DIAGNOSIS — F4321 Adjustment disorder with depressed mood: Secondary | ICD-10-CM | POA: Diagnosis present

## 2014-05-02 DIAGNOSIS — I10 Essential (primary) hypertension: Secondary | ICD-10-CM | POA: Diagnosis present

## 2014-05-02 DIAGNOSIS — Z23 Encounter for immunization: Secondary | ICD-10-CM | POA: Diagnosis not present

## 2014-05-02 DIAGNOSIS — I671 Cerebral aneurysm, nonruptured: Secondary | ICD-10-CM | POA: Diagnosis present

## 2014-05-02 DIAGNOSIS — I63032 Cerebral infarction due to thrombosis of left carotid artery: Secondary | ICD-10-CM | POA: Diagnosis not present

## 2014-05-02 DIAGNOSIS — E785 Hyperlipidemia, unspecified: Secondary | ICD-10-CM | POA: Diagnosis present

## 2014-05-02 DIAGNOSIS — I639 Cerebral infarction, unspecified: Principal | ICD-10-CM | POA: Diagnosis present

## 2014-05-02 DIAGNOSIS — R05 Cough: Secondary | ICD-10-CM

## 2014-05-02 DIAGNOSIS — Z7902 Long term (current) use of antithrombotics/antiplatelets: Secondary | ICD-10-CM

## 2014-05-02 DIAGNOSIS — R471 Dysarthria and anarthria: Secondary | ICD-10-CM

## 2014-05-02 DIAGNOSIS — I259 Chronic ischemic heart disease, unspecified: Secondary | ICD-10-CM | POA: Diagnosis not present

## 2014-05-02 DIAGNOSIS — L530 Toxic erythema: Secondary | ICD-10-CM | POA: Diagnosis not present

## 2014-05-02 DIAGNOSIS — J439 Emphysema, unspecified: Secondary | ICD-10-CM | POA: Diagnosis present

## 2014-05-02 DIAGNOSIS — Z720 Tobacco use: Secondary | ICD-10-CM | POA: Diagnosis present

## 2014-05-02 DIAGNOSIS — I480 Paroxysmal atrial fibrillation: Secondary | ICD-10-CM | POA: Diagnosis present

## 2014-05-02 DIAGNOSIS — I69354 Hemiplegia and hemiparesis following cerebral infarction affecting left non-dominant side: Secondary | ICD-10-CM

## 2014-05-02 DIAGNOSIS — L538 Other specified erythematous conditions: Secondary | ICD-10-CM

## 2014-05-02 DIAGNOSIS — Z7982 Long term (current) use of aspirin: Secondary | ICD-10-CM

## 2014-05-02 DIAGNOSIS — R4701 Aphasia: Secondary | ICD-10-CM | POA: Diagnosis not present

## 2014-05-02 DIAGNOSIS — I2510 Atherosclerotic heart disease of native coronary artery without angina pectoris: Secondary | ICD-10-CM | POA: Diagnosis present

## 2014-05-02 DIAGNOSIS — R059 Cough, unspecified: Secondary | ICD-10-CM

## 2014-05-02 HISTORY — DX: Atrial premature depolarization: I49.1

## 2014-05-02 LAB — DIFFERENTIAL
BASOS PCT: 0 % (ref 0–1)
Basophils Absolute: 0 10*3/uL (ref 0.0–0.1)
Basophils Absolute: 0 10*3/uL (ref 0.0–0.1)
Basophils Relative: 0 % (ref 0–1)
EOS PCT: 0 % (ref 0–5)
Eosinophils Absolute: 0 10*3/uL (ref 0.0–0.7)
Eosinophils Absolute: 0 10*3/uL (ref 0.0–0.7)
Eosinophils Relative: 0 % (ref 0–5)
LYMPHS ABS: 1.8 10*3/uL (ref 0.7–4.0)
Lymphocytes Relative: 25 % (ref 12–46)
Lymphocytes Relative: 25 % (ref 12–46)
Lymphs Abs: 1.6 10*3/uL (ref 0.7–4.0)
Monocytes Absolute: 0.5 10*3/uL (ref 0.1–1.0)
Monocytes Absolute: 0.4 10*3/uL (ref 0.1–1.0)
Monocytes Relative: 7 % (ref 3–12)
Monocytes Relative: 6 % (ref 3–12)
NEUTROS ABS: 4.2 10*3/uL (ref 1.7–7.7)
NEUTROS ABS: 4.8 10*3/uL (ref 1.7–7.7)
Neutrophils Relative %: 68 % (ref 43–77)
Neutrophils Relative %: 69 % (ref 43–77)

## 2014-05-02 LAB — URINALYSIS, ROUTINE W REFLEX MICROSCOPIC
Bilirubin Urine: NEGATIVE
Glucose, UA: NEGATIVE mg/dL
HGB URINE DIPSTICK: NEGATIVE
Ketones, ur: NEGATIVE mg/dL
Leukocytes, UA: NEGATIVE
Nitrite: NEGATIVE
PH: 7 (ref 5.0–8.0)
PROTEIN: NEGATIVE mg/dL
Specific Gravity, Urine: 1.007 (ref 1.005–1.030)
Urobilinogen, UA: 0.2 mg/dL (ref 0.0–1.0)

## 2014-05-02 LAB — CBC
HCT: 40 % (ref 36.0–46.0)
Hemoglobin: 13.8 g/dL (ref 12.0–15.0)
MCH: 28.8 pg (ref 26.0–34.0)
MCHC: 34.5 g/dL (ref 30.0–36.0)
MCV: 83.3 fL (ref 78.0–100.0)
Platelets: 145 10*3/uL — ABNORMAL LOW (ref 150–400)
RBC: 4.8 MIL/uL (ref 3.87–5.11)
RDW: 13.8 % (ref 11.5–15.5)
WBC: 6.1 10*3/uL (ref 4.0–10.5)

## 2014-05-02 LAB — BASIC METABOLIC PANEL
ANION GAP: 13 (ref 5–15)
BUN: 8 mg/dL (ref 6–23)
CHLORIDE: 104 mmol/L (ref 96–112)
CO2: 23 mmol/L (ref 19–32)
Calcium: 9 mg/dL (ref 8.4–10.5)
Creatinine, Ser: 0.71 mg/dL (ref 0.50–1.10)
GFR calc Af Amer: >90 mL/min (ref >90)
GFR calc non Af Amer: 85 mL/min — ABNORMAL LOW (ref >90)
GLUCOSE: 103 mg/dL — AB (ref 70–99)
Potassium: 3.9 mmol/L (ref 3.5–5.1)
SODIUM: 140 mmol/L (ref 135–145)

## 2014-05-02 LAB — I-STAT CHEM 8, ED
BUN: 8 mg/dL (ref 6–23)
CALCIUM ION: 1.11 mmol/L — AB (ref 1.13–1.30)
CHLORIDE: 104 mmol/L (ref 96–112)
CREATININE: 0.7 mg/dL (ref 0.50–1.10)
Glucose, Bld: 104 mg/dL — ABNORMAL HIGH (ref 70–99)
HEMATOCRIT: 44 % (ref 36.0–46.0)
Hemoglobin: 15 g/dL (ref 12.0–15.0)
POTASSIUM: 4 mmol/L (ref 3.5–5.1)
SODIUM: 141 mmol/L (ref 135–145)
TCO2: 23 mmol/L (ref 0–100)

## 2014-05-02 LAB — RAPID URINE DRUG SCREEN, HOSP PERFORMED
AMPHETAMINES: NOT DETECTED
BARBITURATES: NOT DETECTED
BENZODIAZEPINES: NOT DETECTED
Cocaine: NOT DETECTED
OPIATES: NOT DETECTED
Tetrahydrocannabinol: NOT DETECTED

## 2014-05-02 LAB — PROTIME-INR
INR: 1.02 (ref 0.00–1.49)
Prothrombin Time: 13.5 seconds (ref 11.6–15.2)

## 2014-05-02 LAB — ETHANOL

## 2014-05-02 LAB — I-STAT TROPONIN, ED: Troponin i, poc: 0.01 ng/mL (ref 0.00–0.08)

## 2014-05-02 LAB — CBG MONITORING, ED: Glucose-Capillary: 101 mg/dL — ABNORMAL HIGH (ref 70–99)

## 2014-05-02 LAB — APTT: aPTT: 29 seconds (ref 24–37)

## 2014-05-02 MED ORDER — ASPIRIN EC 81 MG PO TBEC
81.0000 mg | DELAYED_RELEASE_TABLET | ORAL | Status: DC
  Administered 2014-05-03: 81 mg via ORAL
  Filled 2014-05-02 (×2): qty 1

## 2014-05-02 MED ORDER — LISINOPRIL 20 MG PO TABS
20.0000 mg | ORAL_TABLET | Freq: Every day | ORAL | Status: DC
  Administered 2014-05-02 – 2014-05-03 (×2): 20 mg via ORAL
  Filled 2014-05-02 (×2): qty 1

## 2014-05-02 MED ORDER — ENOXAPARIN SODIUM 40 MG/0.4ML ~~LOC~~ SOLN
40.0000 mg | SUBCUTANEOUS | Status: DC
  Administered 2014-05-02 – 2014-05-03 (×2): 40 mg via SUBCUTANEOUS
  Filled 2014-05-02 (×2): qty 0.4

## 2014-05-02 MED ORDER — LORAZEPAM 2 MG/ML IJ SOLN
1.0000 mg | Freq: Once | INTRAMUSCULAR | Status: AC
Start: 2014-05-02 — End: 2014-05-02
  Administered 2014-05-02: 1 mg via INTRAVENOUS
  Filled 2014-05-02 (×2): qty 1

## 2014-05-02 MED ORDER — PNEUMOCOCCAL VAC POLYVALENT 25 MCG/0.5ML IJ INJ: 0.5000 mL | INJECTION | INTRAMUSCULAR | Status: DC

## 2014-05-02 MED ORDER — LORAZEPAM 2 MG/ML IJ SOLN
1.0000 mg | Freq: Once | INTRAMUSCULAR | Status: AC
Start: 2014-05-02 — End: 2014-05-02
  Administered 2014-05-02: 1 mg via INTRAVENOUS
  Filled 2014-05-02: qty 1

## 2014-05-02 MED ORDER — STROKE: EARLY STAGES OF RECOVERY BOOK
Freq: Once | Status: AC
  Administered 2014-05-02: 23:00:00

## 2014-05-02 MED ORDER — DIGOXIN 125 MCG PO TABS
250.0000 ug | ORAL_TABLET | Freq: Every day | ORAL | Status: DC
  Administered 2014-05-02 – 2014-05-04 (×3): 250 ug via ORAL
  Filled 2014-05-02 (×3): qty 2

## 2014-05-02 MED ORDER — CLOPIDOGREL BISULFATE 75 MG PO TABS
75.0000 mg | ORAL_TABLET | Freq: Every day | ORAL | Status: DC
  Administered 2014-05-02 – 2014-05-04 (×3): 75 mg via ORAL
  Filled 2014-05-02 (×3): qty 1

## 2014-05-02 MED ORDER — FENTANYL CITRATE 0.05 MG/ML IJ SOLN: 25.0000 ug | INTRAMUSCULAR | Status: DC | PRN

## 2014-05-02 MED ORDER — LORAZEPAM 2 MG/ML IJ SOLN: 0.5000 mg | Freq: Three times a day (TID) | INTRAMUSCULAR | Status: DC | PRN

## 2014-05-02 MED ORDER — NICOTINE 7 MG/24HR TD PT24
7.0000 mg | MEDICATED_PATCH | TRANSDERMAL | Status: DC
  Filled 2014-05-02 (×3): qty 1

## 2014-05-02 MED ORDER — HYDRALAZINE HCL 20 MG/ML IJ SOLN
10.0000 mg | Freq: Four times a day (QID) | INTRAMUSCULAR | Status: DC | PRN
  Administered 2014-05-02: 10 mg via INTRAVENOUS
  Filled 2014-05-02: qty 1

## 2014-05-02 MED ORDER — SENNOSIDES-DOCUSATE SODIUM 8.6-50 MG PO TABS: 1.0000 | ORAL_TABLET | Freq: Every evening | ORAL | Status: DC | PRN

## 2014-05-02 MED ORDER — METOPROLOL SUCCINATE ER 25 MG PO TB24
25.0000 mg | ORAL_TABLET | Freq: Two times a day (BID) | ORAL | Status: DC
  Administered 2014-05-02 – 2014-05-04 (×4): 25 mg via ORAL
  Filled 2014-05-02 (×4): qty 1

## 2014-05-02 MED ORDER — GADOBENATE DIMEGLUMINE 529 MG/ML IV SOLN
13.0000 mL | Freq: Once | INTRAVENOUS | Status: AC | PRN
  Administered 2014-05-02: 13 mL via INTRAVENOUS

## 2014-05-02 NOTE — ED Notes (Signed)
CBG was 101 

## 2014-05-02 NOTE — ED Notes (Signed)
MRI contacted for pick-up.

## 2014-05-02 NOTE — Consult Note (Signed)
Referring Physician: Dr Wyvonnia Dusky    Chief Complaint: Code stroke - speech difficulties  HPI: Jeanne Lawson is a 69 y.o. 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 Her past medical history is also significant for hypertension, paroxysmal atrial fibrillation, previous TIAs, known cerebral aneurysms by MRA in 2011 , ongoing tobacco use, mild coronary artery disease, and a respiratory infection with productive cough but no fever present for approximately 2 weeks and currently being treated with amoxicillin, Robitussin-DM, and an inhaler. The patient spoke with one of her daughters by telephone last night and appeared to be in her normal state of health. This morning one of her daughters called her just after 11 AM and noted that her mother had very slow and dysarthric speech. She sounded so abnormal that at first the daughter thought she had the wrong number. The patient stated that suddenly a bad feeling came over her and she hung up the phone and called 911. Her daughters thought that initially she may have been confused but now they are thinking it may have just been her speech difficulties. They also noted a facial droop which has since resolved. She was brought in as a code stroke. The patient denied any focal weakness. Her initial CT of the head was unremarkable. An MRI/MRA is pending. She has taken aspirin 81 mg daily without fail since her stroke 6 years ago.  Date last known well: Date: 05/01/2014 Time last known well: Unable to determine tPA Given: No: Late presentation and minimal deficits.  Past Medical History  Diagnosis Date  . Ventricular hypertrophy 04/2009    LVH with diastolic dysfunction by echo. Has normal EF.  Marland Kitchen Pancreatitis     x2  . PAF (paroxysmal atrial fibrillation)   . TIA (transient ischemic attack)     She was hospitalized 04-23-09 through 04-27-09 for  involving right side of the body  . 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 Hypertension   . 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  . Tobacco abuse     Ongoing   . Edema of foot     She has a history of chronic edema of the left dated back to age 47 when she suufered severe frostbite playing  on the snow as a child  . Coronary artery disease 1996    Known with prior mild lesion of LAD demonstrated by Cardiac Catheterization in 1996    Past Surgical History  Procedure Laterality Date  . 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 Neck surgery  50 yrs ago    Left side  . Cardiac catheterization  1996    Mild CAD with vasospasm  . US echocardiography  04-26-2009    EF 65-70%  . Cardiovascular stress test  12-02-2001    EF 70%    Family History  Problem Relation Age of Onset  . Emphysema Father   . Aneurysm Sister   Her mother had TIAs. 2 grandparents had strokes   Social History:  reports that she has been smoking Cigarettes.  She uses smokeless tobacco. She reports that she does not drink alcohol or use illicit  drugs. She lives alone but has supportive children and grandchildren. She previously had a housecleaning business.  Allergies:  Allergies  Allergen Reactions  . Dilaudid [Hydromorphone] Swelling  . Codeine Nausea And Vomiting  . Hydromorphone Hcl Swelling  . Sulfa Drugs Cross Reactors Other (See Comments)    unknown    Medications: Prior to Admission:  (Not in a hospital admission)   Review of Systems  Constitutional: Positive for malaise/fatigue. Negative for fever, chills, weight loss and diaphoresis.  HENT: Positive for congestion. Negative for ear discharge, ear pain, hearing loss, nosebleeds, sore throat and tinnitus.   Eyes: Negative for blurred vision, double vision, photophobia, pain, discharge and  redness.  Respiratory: Positive for cough, sputum production and shortness of breath. Negative for hemoptysis, wheezing and stridor.   Cardiovascular: Positive for palpitations and leg swelling. Negative for chest pain, orthopnea, claudication and PND.  Gastrointestinal: Positive for diarrhea. Negative for heartburn, nausea, vomiting, abdominal pain, constipation, blood in stool and melena.  Genitourinary: Positive for urgency and frequency. Negative for dysuria, hematuria and flank pain.  Musculoskeletal: Negative for myalgias, back pain, joint pain, falls and neck pain.  Skin: Negative for itching and rash.  Neurological: Positive for speech change and weakness. Negative for dizziness, tingling, tremors, sensory change, focal weakness, seizures, loss of consciousness and headaches.  Endo/Heme/Allergies: Negative for environmental allergies and polydipsia. Does not bruise/bleed easily.  Psychiatric/Behavioral: Positive for memory loss. Negative for depression, suicidal ideas, hallucinations and substance abuse. The patient is not nervous/anxious and does not have insomnia.   All other systems reviewed and are negative.   Physical Examination: Blood pressure 174/92, pulse 88, temperature 97.5 F (36.4 C), temperature source Oral, resp. rate 25, height 5\' 1"  (1.549 m), weight 61.236 kg (135 lb), SpO2 97 %.  Mental Status: Alert, oriented, thought content appropriate.  Speech somewhat slowed and mildly slurred.  Able to follow 3 step commands without difficulty. Cranial Nerves: II: Discs not visualized; Visual fields grossly normal, pupils equal, round, reactive to light and accommodation III,IV, VI: ptosis not present, extra-ocular motions intact bilaterally V,VII: smile symmetric, facial light touch sensation normal bilaterally VIII: hearing normal bilaterally IX,X: gag reflex present XI: bilateral shoulder shrug XII: midline tongue extension Motor: Right : Upper extremity   5/5    Left:      Upper extremity   4/5  Lower extremity   5/5     Lower extremity   5/5 Tone and bulk:normal tone throughout; no atrophy noted Sensory: Light touch intact throughout, bilaterally Deep Tendon Reflexes: 2+ and symmetric throughout Plantars: Right: downgoing   Left: downgoing Cerebellar: normal finger-to-nose with right upper extremity. Dysmetria with left upper extremity. Normal heel-to-shin test Gait: Deferred CV: pulses palpable throughout   Laboratory Studies:  Basic Metabolic Panel:  Recent Labs Lab 05/02/14 1324  NA 141  K 4.0  CL 104  GLUCOSE 104*  BUN 8  CREATININE 0.70    Liver Function Tests: No results for input(s): AST, ALT, ALKPHOS, BILITOT, PROT, ALBUMIN in the last 168 hours. No results for input(s): LIPASE, AMYLASE in the last 168 hours. No results for input(s): AMMONIA in the last 168 hours.  CBC:  Recent Labs Lab 05/02/14 1324  HGB 15.0  HCT 44.0    Cardiac Enzymes: No results for input(s): CKTOTAL, CKMB, CKMBINDEX, TROPONINI in the last 168 hours.  BNP: Invalid input(s): POCBNP  CBG:  Recent Labs Lab 05/02/14 1259  GLUCAP 101*    Microbiology: Results for orders placed or performed during the  hospital encounter of 11/05/08  Urine culture     Status: None   Collection Time: 11/10/08 10:32 AM  Result Value Ref Range Status   Specimen Description URINE, CLEAN CATCH  Final   Special Requests NONE  Final   Colony Count 7,000 COLONIES/ML  Final   Culture INSIGNIFICANT GROWTH  Final   Report Status 11/11/2008 FINAL  Final    Coagulation Studies: No results for input(s): LABPROT, INR in the last 72 hours.  Urinalysis: No results for input(s): COLORURINE, LABSPEC, PHURINE, GLUCOSEU, HGBUR, BILIRUBINUR, KETONESUR, PROTEINUR, UROBILINOGEN, NITRITE, LEUKOCYTESUR in the last 168 hours.  Invalid input(s): APPERANCEUR  Lipid Panel:    Component Value Date/Time   CHOL 146 05/06/2012 1444   TRIG 128.0 05/06/2012 1444   HDL 36.20*  05/06/2012 1444   CHOLHDL 4 05/06/2012 1444   VLDL 25.6 05/06/2012 1444   LDLCALC 84 05/06/2012 1444    HgbA1C:  Lab Results  Component Value Date   HGBA1C  04/27/2009    5.8 (NOTE) The ADA recommends the following therapeutic goal for glycemic control related to Hgb A1c measurement: Goal of therapy: <6.5 Hgb A1c  Reference: American Diabetes Association: Clinical Practice Recommendations 2010, Diabetes Care, 2010, 33: (Suppl  1).    Urine Drug Screen:  No results found for: LABOPIA, COCAINSCRNUR, LABBENZ, AMPHETMU, THCU, LABBARB  Alcohol Level: No results for input(s): ETH in the last 168 hours.  Other results: EKG: Normal sinus rhythm rate 62 bpm. T-wave abnormalities leads 1, aVL, and V4 through V6.  Imaging:  Ct Head Wo Contrast 05/02/2014    1. Similar findings of atrophy and extensive microvascular ischemic disease without definite superimposed acute intracranial process.  2. Extensive sinus disease, including air-fluid levels within the bilateral maxillary sinuses, as above.       Assessment: 69 y.o. female brought in as a code stroke after her daughter noted speech abnormalities while speaking to her on the telephone in the setting of a recent lower respiratory tract infection and previous stroke history.  Stroke Risk Factors - atrial fibrillation, family history, hypertension, smoking and Previous stroke / TIA   Lowry Ram [facility] Pager [phone] 05/02/2014, 2:26 PM   I have seen and evaluated the patient. I have reviewed the above note and agree.   Possible new stroke with LKW of last night vs recrudescence of previous stroke symptoms in the setting of infection.   1) MRI brain, if negative no further workup and evaluation of infection per ED.  2) If positive, would pursue further stroke workup.   Roland Rack, MD [facility] [phone]  If 7pm- 7am, please page neurology on call as listed in  [place].

## 2014-05-02 NOTE — H&P (Signed)
[facility] Admission History and Physical       Jeanne Lawson YJE:[id number] DOB: 1945/11/16 DOA: 05/02/2014  Referring physician: EDP PCP: [name], MD  Specialists:   Chief Complaint: Difficulty Speaking and Leaning to her Left  HPI: Jeanne Lawson is a 69 y.o. female with a history of a CVA with residual Left Sided Weakness, Saccular Cerebral Aneurysm, TIAs,  CAD, Paroxsmal Atrial Fibrillation, HTN, who presents to the ED with complaints of leaning to the left upon awakening in the AM at 7 AM, and later in the AM was talking to her daughter and she was noticed to have slurring of her speech.  Her symptoms began to improve but she still had hesitance of her speech, and some confusion.  She was brought to the ED, and evaluated and a CT scan and MRI were performed and revealed a Small Infarct at the junction of the posterior superior aspect of the Lenticular Nucleus of the corona radiata as well as enlargement of the known Cerebral Saccular Aneurysm .       Review of Systems:  Constitutional: No Weight Loss, No Weight Gain, Night Sweats, Fevers, Chills, Dizziness, Light Headedness, Fatigue, or Generalized Weakness HEENT: No Headaches, Difficulty Swallowing,Tooth/Dental Problems,Sore Throat,  No Sneezing, Rhinitis, Ear Ache, Nasal Congestion, or Post Nasal Drip,  Cardio-vascular:  No Chest pain, Orthopnea, PND, Edema in Lower Extremities, Anasarca, Dizziness, Palpitations  Resp: No Dyspnea, No DOE, No Productive Cough, No Non-Productive Cough, No Hemoptysis, No Wheezing.    GI: No Heartburn, Indigestion, Abdominal Pain, Nausea, Vomiting, Diarrhea, Constipation, Hematemesis, Hematochezia, Melena, Change in Bowel Habits,  Loss of Appetite  GU: No Dysuria, No Change in Color of Urine, No Urgency or Urinary Frequency, No Flank pain.  Musculoskeletal: No Joint Pain or Swelling, No Decreased Range of Motion, No Back Pain.  Neurologic: No Syncope, No Seizures, Left Sided Weakness,  Paresthesia, Vision Disturbance or Loss, No Diplopia,+Difficulty Speaking,  No Vertigo, No Difficulty Walking,  Skin: No Rash or Lesions. Psych: No Change in Mood or Affect, No Depression or Anxiety, No Memory loss, No Confusion, or Hallucinations   Past Medical History  Diagnosis Date  . Ventricular hypertrophy 04/2009    LVH with diastolic dysfunction by echo. Has normal EF.  Marland Kitchen Pancreatitis     x2  . PAF (paroxysmal atrial fibrillation)   . TIA (transient ischemic attack)     She was hospitalized 04-23-09 through 04-27-09 for involving right side of the body  . 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 Hypertension   . 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  . Tobacco abuse     Ongoing   . Edema of foot     She has a history of chronic edema of the left dated back to age 63 when she suufered severe frostbite playing  on the snow as a child  . Coronary artery disease 1996    Known with prior mild lesion of LAD demonstrated by Cardiac Catheterization in 1996     Past Surgical History  Procedure Laterality Date  . 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 Neck surgery  50 yrs ago    Left side  . Cardiac catheterization  1996    Mild CAD with vasospasm  . US echocardiography  04-26-2009    EF 65-70%  . Cardiovascular stress test  12-02-2001    EF 70%      Prior to Admission medications   Medication Sig Start Date End Date Taking? Authorizing Provider  albuterol (PROVENTIL HFA;VENTOLIN HFA) 108 (90 BASE) MCG/ACT inhaler Inhale 1-2 puffs into the lungs every 6 (six) hours as needed for wheezing or shortness of breath.   Yes Historical Provider, MD  aspirin 81 MG tablet Take 81 mg by mouth every other day. Taking every other day   Yes Historical Provider, MD  LANOXIN 250 MCG tablet TAKE 1 TABLET BY MOUTH DAILY. 11/14/13  Yes  Darlin Coco, MD  lisinopril (PRINIVIL,ZESTRIL) 20 MG tablet Take 20 mg by mouth daily.   Yes Historical Provider, MD  TOPROL XL 50 MG 24 hr tablet TAKE 1/2 TABLET BY MOUTH TWICE DAILY. 02/04/14  Yes Darlin Coco, MD  clopidogrel (PLAVIX) 75 MG tablet Take 1 tablet (75 mg total) by mouth daily. Patient not taking: Reported on 05/02/2014 05/13/13   Darlin Coco, MD  lisinopril (PRINIVIL,ZESTRIL) 10 MG tablet Take 1 tablet (10 mg total) by mouth daily. Patient not taking: Reported on 05/02/2014 09/05/13   Darlin Coco, MD     Allergies  Allergen Reactions  . Dilaudid [Hydromorphone] Swelling  . Codeine Nausea And Vomiting  . Hydromorphone Hcl Swelling  . Sulfa Drugs Cross Reactors Other (See Comments)    unknown    Social History:  Lives Alone, Walks with a Kasandra Knudsen  reports that she has been smoking Cigarettes.  She uses smokeless tobacco. She reports that she does not drink alcohol or use illicit drugs.        Family History  Problem Relation Age of Onset  . Emphysema Father   . Aneurysm Sister   . Stroke Mother   . Hypertension Mother   . Hypertension Daughter   . Thyroid disease Daughter        Physical Exam:  GEN:  Pleasant Well Nourished and Well Developed Elderly  69 y.o. Caucasian female examined and in no acute distress; cooperative with exam Filed Vitals:   05/02/14 1914 05/02/14 1930 05/02/14 1945 05/02/14 2000  BP:  158/63 166/93 149/80  Pulse:  67 62 67  Temp: 98.2 F (36.8 C)     TempSrc:      Resp:      Height:      Weight:      SpO2:  97% 97% 97%   Blood pressure 149/80, pulse 67, temperature 98.2 F (36.8 C), temperature source Oral, resp. rate 27, height 5\' 1"  (1.549 m), weight 61.236 kg (135 lb), SpO2 97 %. PSYCH: She is alert and oriented x4; does not appear anxious does not appear depressed; affect is normal HEENT: Normocephalic and Atraumatic, Mucous membranes pink; PERRLA; EOM intact; Fundi:  Benign;  No scleral icterus, Nares: Patent,  Oropharynx: Clear, Edentulous,    Neck:  FROM, No Cervical Lymphadenopathy nor Thyromegaly or Carotid Bruit; No JVD; Breasts:: Not examined CHEST WALL: No tenderness CHEST: Normal respiration, clear to auscultation bilaterally HEART: Regular rate and rhythm; no murmurs rubs or gallops BACK: No kyphosis or scoliosis; No CVA tenderness ABDOMEN: Positive Bowel Sounds, Soft Non-Tender, No Rebound or Guarding; No Masses, No Organomegaly. Rectal Exam: Not done EXTREMITIES: No Cyanosis, Clubbing, or Edema; No Ulcerations. Genitalia: not examined PULSES: 2+ and symmetric SKIN: Normal hydration no rash or ulceration CNS:  Alert and Oriented x 4, Mental Status:  Alert, Oriented, Thought Content Appropriate. Speech Hesitant but Fluent  without evidence of Dysarthria Able to follow 3 step commands without difficulty.  In No obvious pain.   Cranial Nerves:  II: Discs flat bilaterally; Visual fields Intact Pupils equal and reactive.    III,IV, VI: Extra-ocular motions intact bilaterally    V,VII: smile symmetric, facial light touch sensation normal bilaterally    VIII: hearing intact bilaterally    IX,X: gag reflex present    XI: bilateral shoulder shrug    XII: midline tongue extension   Motor:  Right:  Upper extremity 5/5     Left:  Upper extremity 5- Minus/5     Right:  Lower extremity 5/5    Left:  Lower extremity 5-Minus/5     Tone and Bulk:  normal tone throughout; no atrophy noted   Sensory:  Pinprick and light touch intact throughout, bilaterally   Deep Tendon Reflexes: 2+ and symmetric throughout   Plantars/ Babinski:  Right: equivocal  Left: equivocal    Cerebellar:  Finger to nose without difficulty.   Gait: deferred    Vascular: pulses palpable throughout    Labs on Admission:  Basic Metabolic Panel:  Recent Labs Lab 05/02/14 1313 05/02/14 1324  NA 140 141  K 3.9 4.0  CL 104 104  CO2 23  --   GLUCOSE 103* 104*  BUN 8 8  CREATININE 0.71 0.70  CALCIUM 9.0  --     Liver Function Tests: No results for input(s): AST, ALT, ALKPHOS, BILITOT, PROT, ALBUMIN in the last 168 hours. No results for input(s): LIPASE, AMYLASE in the last 168 hours. No results for input(s): AMMONIA in the last 168 hours. CBC:  Recent Labs Lab 05/02/14 1313 05/02/14 1324  WBC 6.1  --   NEUTROABS 4.2  4.8  --   HGB 13.8 15.0  HCT 40.0 44.0  MCV 83.3  --   PLT 145*  --    Cardiac Enzymes: No results for input(s): CKTOTAL, CKMB, CKMBINDEX, TROPONINI in the last 168 hours.  BNP (last 3 results) No results for input(s): BNP in the last 8760 hours.  ProBNP (last 3 results) No results for input(s): PROBNP in the last 8760 hours.  CBG:  Recent Labs Lab 05/02/14 1259  GLUCAP 101*    Radiological Exams on Admission: Dg Chest 2 View  05/02/2014   CLINICAL DATA:  Productive cough. Shortness of breath. History smoking 2-3 cigarettes per day.  EXAM: CHEST - 2 VIEW  COMPARISON:  Two-view chest x-ray 04/22/2014.  FINDINGS: Emphysematous changes are again noted. The heart is enlarged. There is no edema or effusion to suggest failure. No focal airspace disease is evident. The visualized soft tissues and bony thorax are unremarkable.  IMPRESSION: 1. Stable cardiomegaly without failure. 2. Emphysema.   Electronically Signed   By: San Morelle M.D.   On: 05/02/2014 19:34   Ct Head Wo Contrast  05/02/2014   CLINICAL DATA:  Code stroke. Slurred speech. Leaning towards the left side. History of TIA, CVA, hypertension and CAD.  EXAM: CT HEAD WITHOUT CONTRAST  TECHNIQUE: Contiguous axial images were obtained from the base of the skull through the vertex without intravenous contrast.  COMPARISON:  04/25/2009; 04/24/2009; brain MRI - 04/24/2009  FINDINGS: Similar findings of advanced atrophy with sulcal prominence and prominence of the extra-axial spaces about the bilateral parietal cortices. Extensive periventricular hypodensities compatible with microvascular ischemic disease, left  greater than right. There is unchanged apparent focal ectasia involving the central aspect of the left MCA (image 12, series 201). Given extensive background  parenchymal abnormalities, there is no CT evidence of superimposed acute large territory infarct. No intraparenchymal or extra-axial mass or hemorrhage. Unchanged size and configuration of the ventricles and basilar cisterns. No midline shift.  Nearly confluent opacification of the bilateral anterior and posterior ethmoidal air cells. Air-fluid levels and mucosal thickening are seen with the bilateral maxillary sinuses. The sphenoid and frontal sinuses appear normally aerated. Mastoid air cells are normally aerated. Regional soft tissues appear normal. No displaced calvarial fracture.  IMPRESSION: 1. Similar findings of atrophy and extensive microvascular ischemic disease without definite superimposed acute intracranial process. 2. Extensive sinus disease, including air-fluid levels within the bilateral maxillary sinuses, as above. Critical Value/emergent results were called by telephone at the time of interpretation on 05/02/2014 at 1:17 pm to Dr. Leonel Ramsay, who verbally acknowledged these results.   Electronically Signed   By: Sandi Mariscal M.D.   On: 05/02/2014 13:23   Mr Virgel Paling Wo Contrast  05/02/2014   CLINICAL DATA:  69 year old hypertensive female with history of atrial fibrillation and known cerebral aneurysms presenting with abnormal speech and transient facial droop. Initial encounter.  EXAM: MR HEAD WITHOUT CONTRAST  MR CIRCLE OF WILLIS WITHOUT CONTRAST  MRA OF THE NECK WITHOUT AND WITH CONTRAST  TECHNIQUE: Multiplanar, multiecho pulse sequences of the brain and surrounding structures were obtained according to standard protocol without intravenous contrast.; Angiographic images of the Circle of Willis were obtained using MRA technique without intravenous contrast.; Multiplanar and multiecho pulse sequences of the neck were obtained without and  with intravenous contrast. Angiographic images of the neck were obtained using MRA technique without and with intravenous contast.  CONTRAST:  48mL MULTIHANCE GADOBENATE DIMEGLUMINE 529 MG/ML IV SOLN  COMPARISON:  05/02/2014 head CT. 04/24/2009 brain MR and MR angiogram.  FINDINGS: MR HEAD FINDINGS  Exam is motion degraded. Sagittal T1 weighted imaging not performed.  Acute nonhemorrhagic small infarct at the junction of the posterior superior aspect of the left lenticular nucleus and the corona radiata.  Remote bilateral centrum semiovale/ corona radiata infarcts. Remote bilateral thalamic and basal ganglia small infarcts. Prominent small vessel disease type changes.  No intracranial hemorrhage.  Global atrophy without hydrocephalus.  No intracranial mass lesion noted on this unenhanced exam.  Aneurysms as discussed below.  MR CIRCLE OF WILLIS FINDINGS  Exam is motion degraded.  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  Moderate narrowing of portions of the A1 segment of the right anterior cerebral artery.  Middle cerebral artery moderate branch vessel narrowing and irregularity.  A2 segment anterior cerebral artery moderate narrowing and irregularity.  Right vertebral artery ends in a posterior inferior cerebellar artery distribution. Mild to moderate narrowing of portions of the distal right vertebral artery.  Slight irregularity with mild narrowing distal left vertebral artery and basilar artery.  Nonvisualized left posterior inferior cerebellar artery.  Nonvisualized right anterior inferior cerebellar artery.  Fetal type contribution to the right posterior cerebral  artery.  Moderate narrowing P1 -2 junction left posterior cerebral artery.  MRA NECK FINDINGS  Evaluation of aortic arch and great vessels limited by motion. Two vessel aortic arch.  Ectatic  common carotid arteries.  No significant stenosis of either carotid bifurcation.  Ectatic vertical cervical segment of the internal carotid artery bilaterally.  Limited evaluation of the origin of the vertebral arteries. Left vertebral artery is dominant. Areas of narrowing, regularity ectasia of vertebral arteries bilaterally.  IMPRESSION: MR HEAD  Exam is motion degraded. Sagittal T1 weighted imaging not performed.  Acute nonhemorrhagic small infarct at the junction of the posterior superior aspect of the left lenticular nucleus and the corona radiata.  Remote bilateral centrum semiovale/ corona radiata infarcts. Remote bilateral thalamic and basal ganglia small infarcts. Prominent small vessel disease type changes.  No intracranial hemorrhage.  MR CIRCLE OF WILLIS  Exam is motion degraded.  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  Moderate narrowing of portions of the A1 segment of the right anterior cerebral artery.  Middle cerebral artery moderate branch vessel narrowing and irregularity.  A2 segment anterior cerebral artery moderate narrowing and irregularity.  Right vertebral artery ends in a posterior inferior cerebellar artery distribution. Mild to moderate narrowing of portions of the distal right vertebral artery.  Slight irregularity with mild narrowing distal left vertebral artery and basilar artery.  Nonvisualized left posterior inferior cerebellar artery.   Nonvisualized right anterior inferior cerebellar artery.  Fetal type contribution to the right posterior cerebral artery.  Moderate narrowing P1 -2 junction left posterior cerebral artery.  MRA NECK  Evaluation of aortic arch and great vessels limited by motion.  Ectatic common carotid arteries.  No significant stenosis of either carotid bifurcation.  Ectatic vertical cervical segment of the internal carotid artery bilaterally.  Limited evaluation of the origin of the vertebral arteries. Left vertebral artery is dominant. Areas of narrowing, regularity ectasia of vertebral arteries bilaterally.   Electronically Signed   By: Genia Del M.D.   On: 05/02/2014 18:20   Mr Angiogram Neck W Wo Contrast  05/02/2014   CLINICAL DATA:  69 year old hypertensive female with history of atrial fibrillation and known cerebral aneurysms presenting with abnormal speech and transient facial droop. Initial encounter.  EXAM: MR HEAD WITHOUT CONTRAST  MR CIRCLE OF WILLIS WITHOUT CONTRAST  MRA OF THE NECK WITHOUT AND WITH CONTRAST  TECHNIQUE: Multiplanar, multiecho pulse sequences of the brain and surrounding structures were obtained according to standard protocol without intravenous contrast.; Angiographic images of the Circle of Willis were obtained using MRA technique without intravenous contrast.; Multiplanar and multiecho pulse sequences of the neck were obtained without and with intravenous contrast. Angiographic images of the neck were obtained using MRA technique without and with intravenous contast.  CONTRAST:  16mL MULTIHANCE GADOBENATE DIMEGLUMINE 529 MG/ML IV SOLN  COMPARISON:  05/02/2014 head CT. 04/24/2009 brain MR and MR angiogram.  FINDINGS: MR HEAD FINDINGS  Exam is motion degraded. Sagittal T1 weighted imaging not performed.  Acute nonhemorrhagic small infarct at the junction of the posterior superior aspect of the left lenticular nucleus and the corona radiata.  Remote bilateral centrum semiovale/ corona radiata  infarcts. Remote bilateral thalamic and basal ganglia small infarcts. Prominent small vessel disease type changes.  No intracranial hemorrhage.  Global atrophy without hydrocephalus.  No intracranial  mass lesion noted on this unenhanced exam.  Aneurysms as discussed below.  MR CIRCLE OF WILLIS FINDINGS  Exam is motion degraded.  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  Moderate narrowing of portions of the A1 segment of the right anterior cerebral artery.  Middle cerebral artery moderate branch vessel narrowing and irregularity.  A2 segment anterior cerebral artery moderate narrowing and irregularity.  Right vertebral artery ends in a posterior inferior cerebellar artery distribution. Mild to moderate narrowing of portions of the distal right vertebral artery.  Slight irregularity with mild narrowing distal left vertebral artery and basilar artery.  Nonvisualized left posterior inferior cerebellar artery.  Nonvisualized right anterior inferior cerebellar artery.  Fetal type contribution to the right posterior cerebral artery.  Moderate narrowing P1 -2 junction left posterior cerebral artery.  MRA NECK FINDINGS  Evaluation of aortic arch and great vessels limited by motion. Two vessel aortic arch.  Ectatic common carotid arteries.  No significant stenosis of either carotid bifurcation.  Ectatic vertical cervical segment of the internal carotid artery bilaterally.  Limited evaluation of the origin of the vertebral arteries. Left vertebral artery is dominant. Areas of narrowing, regularity ectasia of vertebral arteries bilaterally.  IMPRESSION: MR HEAD  Exam is motion  degraded. Sagittal T1 weighted imaging not performed.  Acute nonhemorrhagic small infarct at the junction of the posterior superior aspect of the left lenticular nucleus and the corona radiata.  Remote bilateral centrum semiovale/ corona radiata infarcts. Remote bilateral thalamic and basal ganglia small infarcts. Prominent small vessel disease type changes.  No intracranial hemorrhage.  MR CIRCLE OF WILLIS  Exam is motion degraded.  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  Moderate narrowing of portions of the A1 segment of the right anterior cerebral artery.  Middle cerebral artery moderate branch vessel narrowing and irregularity.  A2 segment anterior cerebral artery moderate narrowing and irregularity.  Right vertebral artery ends in a posterior inferior cerebellar artery distribution. Mild to moderate narrowing of portions of the distal right vertebral artery.  Slight irregularity with mild narrowing distal left vertebral artery and basilar artery.  Nonvisualized left posterior inferior cerebellar artery.  Nonvisualized right anterior inferior cerebellar artery.  Fetal type contribution to the right posterior cerebral artery.  Moderate narrowing P1 -2 junction left posterior cerebral artery.  MRA NECK  Evaluation of aortic arch and great vessels limited by motion.  Ectatic common carotid arteries.  No significant stenosis of either carotid bifurcation.  Ectatic vertical cervical segment of the internal carotid artery bilaterally.  Limited evaluation of the origin of the vertebral arteries. Left vertebral artery is dominant. Areas of narrowing, regularity ectasia of  vertebral arteries bilaterally.   Electronically Signed   By: Alcide Evener.D.  On: 05/02/2014 18:20   Mr Brain Wo Contrast  05/02/2014   CLINICAL DATA:  69 year old hypertensive female with history of atrial fibrillation and known cerebral aneurysms presenting with abnormal speech and transient facial droop. Initial encounter.  EXAM: MR HEAD WITHOUT CONTRAST  MR CIRCLE OF WILLIS WITHOUT CONTRAST  MRA OF THE NECK WITHOUT AND WITH CONTRAST  TECHNIQUE: Multiplanar, multiecho pulse sequences of the brain and surrounding structures were obtained according to standard protocol without intravenous contrast.; Angiographic images of the Circle of Willis were obtained using MRA technique without intravenous contrast.; Multiplanar and multiecho pulse sequences of the neck were obtained without and with intravenous contrast. Angiographic images of the neck were obtained using MRA technique without and with intravenous contast.  CONTRAST:  12mL MULTIHANCE GADOBENATE DIMEGLUMINE 529 MG/ML IV SOLN  COMPARISON:  05/02/2014 head CT. 04/24/2009 brain MR and MR angiogram.  FINDINGS: MR HEAD FINDINGS  Exam is motion degraded. Sagittal T1 weighted imaging not performed.  Acute nonhemorrhagic small infarct at the junction of the posterior superior aspect of the left lenticular nucleus and the corona radiata.  Remote bilateral centrum semiovale/ corona radiata infarcts. Remote bilateral thalamic and basal ganglia small infarcts. Prominent small vessel disease type changes.  No intracranial hemorrhage.  Global atrophy without hydrocephalus.  No intracranial mass lesion noted on this unenhanced exam.  Aneurysms as discussed below.  MR CIRCLE OF WILLIS FINDINGS  Exam is motion degraded.  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  Moderate narrowing of portions of the A1 segment of the right anterior cerebral artery.  Middle cerebral artery moderate branch vessel narrowing and irregularity.  A2 segment anterior cerebral artery moderate narrowing and irregularity.  Right vertebral artery ends in a posterior inferior cerebellar artery distribution. Mild to moderate narrowing of portions of the distal right vertebral artery.  Slight irregularity with mild narrowing distal left vertebral artery and basilar artery.  Nonvisualized left posterior inferior cerebellar artery.  Nonvisualized right anterior inferior cerebellar artery.  Fetal type contribution to the right posterior cerebral artery.  Moderate narrowing P1 -2 junction left posterior cerebral artery.  MRA NECK FINDINGS  Evaluation of aortic arch and great vessels limited by motion. Two vessel aortic arch.  Ectatic common carotid arteries.  No significant stenosis of either carotid bifurcation.  Ectatic vertical cervical segment of the internal carotid artery bilaterally.  Limited evaluation of the origin of the vertebral arteries. Left vertebral artery is dominant. Areas of narrowing, regularity ectasia of vertebral arteries bilaterally.  IMPRESSION: MR HEAD  Exam is motion degraded. Sagittal T1 weighted imaging not performed.  Acute nonhemorrhagic small infarct at the junction of the posterior superior aspect of the left lenticular nucleus and the corona radiata.  Remote bilateral centrum semiovale/ corona radiata infarcts. Remote bilateral thalamic and basal ganglia small infarcts. Prominent small vessel disease type changes.  No intracranial hemorrhage.  MR CIRCLE OF WILLIS  Exam is motion degraded.  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   Aneurysm arising from  the right posterior communicating artery region has increased in size now measuring 9 x 7 x 6 mm versus prior 6 x 5 x 6 mm  Fusiform ectasia of the right internal carotid artery distal vertical segment similar to prior exam.  Moderate narrowing of portions of the A1 segment of the right anterior cerebral artery.  Middle cerebral artery moderate branch vessel narrowing and irregularity.  A2 segment anterior cerebral artery moderate narrowing and irregularity.  Right vertebral artery ends in a posterior inferior cerebellar artery distribution. Mild to moderate narrowing of portions of the distal right vertebral artery.  Slight irregularity with mild narrowing distal left vertebral artery and basilar artery.  Nonvisualized left posterior inferior cerebellar artery.  Nonvisualized right anterior inferior cerebellar artery.  Fetal type contribution to the right posterior cerebral artery.  Moderate narrowing P1 -2 junction left posterior cerebral artery.  MRA NECK  Evaluation of aortic arch and great vessels limited by motion.  Ectatic common carotid arteries.  No significant stenosis of either carotid bifurcation.  Ectatic vertical cervical segment of the internal carotid artery bilaterally.  Limited evaluation of the origin of the vertebral arteries. Left vertebral artery is dominant. Areas of narrowing, regularity ectasia of vertebral arteries bilaterally.   Electronically Signed   By: Genia Del M.D.   On: 05/02/2014 18:20     EKG: Independently reviewed.    Assessment/Plan:   69 y.o. female with  Principal Problem:   1.    CVA (cerebral infarction)   CVA Protocol   Telemetry Monitoring   Neurology consulted    Carotid US, and 2D ECHO in AM   Fasting Lipids, and Hb A1C in AM   PT/OT/ and Speech Evaluation in AM        Active Problems:   2.    Expressive aphasia- Due to #1           3.    Aneurysm/Saccular aneurysm   Monitor     4.    Chronic ischemic heart  disease   stable     5.    Hypertension   PRN IV Hydralazine   Continue Metoprolol     6.    Tobacco abuse   Nicotine Patch      7.    DVT Prophylaxis   Lovenox         Code Status:     FULL CODE      Family Communication:   3 Daughters at Bedside   Disposition Plan:    Inpatient  Status        Time spent:  44 Minutes      Theressa Millard [facility] Pager [phone]   If [place] Please Contact the Day Rounding Team MD for [facility]  If 7PM-7AM, Please Contact Night-Floor Coverage  [email or web address] Password [email or web address] 05/02/2014, 8:57 PM     ADDENDUM:   Patient was seen and examined on 05/02/2014

## 2014-05-02 NOTE — ED Notes (Signed)
Received pt from home with c/o woke up at 0700 today leaning to the left side. Pt talked with her daughter around 56 today who thought that pt speech was slurred. Pt LSN 2230.  Pt with slow speech, but reports that the leaning to the left side has resolved. Pt has history of CVA with left sided deficit. Pt uses cane for ambulation.

## 2014-05-02 NOTE — ED Provider Notes (Signed)
CSN: [id number]     Arrival date & time 05/02/14  1219 History   First MD Initiated Contact with Patient 05/02/14 1233     Chief Complaint  Patient presents with  . Aphasia  . Code Stroke     (Consider location/radiation/quality/duration/timing/severity/associated sxs/prior Treatment) HPI Comments: Patient complains of difficulty speaking that onset around 11:30 this morning. Patient awoke around 7 AM and thought she was leaning to the left side. This resolved after about 20 seconds. She was last seen normal last night about 10:30. She is uncertain when the slurred speech started. Patient with a history of previous stroke and left-sided weakness. She reports no change in her left-sided weakness. She endorses some slurred speech at this time but no other deficits. She denies any chest pain, headache, nausea or vomiting. No visual changes. She is on aspirin and Plavix.  The history is provided by the patient and a relative.    Past Medical History  Diagnosis Date  . Ventricular hypertrophy 04/2009    LVH with diastolic dysfunction by echo. Has normal EF.  Marland Kitchen Pancreatitis     x2  . PAF (paroxysmal atrial fibrillation)   . TIA (transient ischemic attack)     She was hospitalized 04-23-09 through 04-27-09 for involving right side of the body  . 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 Hypertension   . 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  . Tobacco abuse     Ongoing   . Edema of foot     She has a history of chronic edema of the left dated back to age 66 when she suufered severe frostbite playing  on the snow as a child  . Coronary artery disease 1996    Known with prior mild lesion of LAD demonstrated by Cardiac Catheterization in 1996   Past Surgical History  Procedure Laterality Date  . 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  Neck surgery  50 yrs ago    Left side  . Cardiac catheterization  1996    Mild CAD with vasospasm  . US echocardiography  04-26-2009    EF 65-70%  . Cardiovascular stress test  12-02-2001    EF 70%   Family History  Problem Relation Age of Onset  . Emphysema Father   . Aneurysm Sister    History  Substance Use Topics  . Smoking status: Current Every Day Smoker    Types: Cigarettes  . Smokeless tobacco: Current User     Comment: 3-4 cigs a day  . Alcohol Use: No   OB History    No data available     Review of Systems  Constitutional: Negative for fever, activity change and appetite change.  Respiratory: Negative for cough, chest tightness and shortness of breath.   Genitourinary: Negative for dysuria and hematuria.  Neurological: Positive for dizziness, speech difficulty and weakness. Negative for facial asymmetry, light-headedness and headaches.  A complete 10 system review of systems was obtained and all systems are negative except as noted in the HPI and PMH.      Allergies  Dilaudid; Codeine; Hydromorphone hcl; and Sulfa drugs cross reactors  Home Medications   Prior to Admission medications   Medication Sig Start Date End Date Taking? Authorizing Provider  albuterol (PROVENTIL  HFA;VENTOLIN HFA) 108 (90 BASE) MCG/ACT inhaler Inhale 1-2 puffs into the lungs every 6 (six) hours as needed for wheezing or shortness of breath.   Yes Historical Provider, MD  aspirin 81 MG tablet Take 81 mg by mouth every other day. Taking every other day   Yes Historical Provider, MD  LANOXIN 250 MCG tablet TAKE 1 TABLET BY MOUTH DAILY. 11/14/13  Yes Darlin Coco, MD  lisinopril (PRINIVIL,ZESTRIL) 20 MG tablet Take 20 mg by mouth daily.   Yes Historical Provider, MD  TOPROL XL 50 MG 24 hr tablet TAKE 1/2 TABLET BY MOUTH TWICE DAILY. 02/04/14  Yes Darlin Coco, MD  clopidogrel (PLAVIX) 75 MG tablet Take 1 tablet (75 mg total) by mouth daily. Patient not taking: Reported on 05/02/2014  05/13/13   Darlin Coco, MD  lisinopril (PRINIVIL,ZESTRIL) 10 MG tablet Take 1 tablet (10 mg total) by mouth daily. Patient not taking: Reported on 05/02/2014 09/05/13   Darlin Coco, MD   BP 183/99 mmHg  Pulse 63  Temp(Src) 97.5 F (36.4 C) (Oral)  Resp 22  Ht 5\' 1"  (1.549 m)  Wt 135 lb (61.236 kg)  BMI 25.52 kg/m2  SpO2 96% Physical Exam  Constitutional: She is oriented to person, place, and time. She appears well-developed and well-nourished. No distress.  HENT:  Head: Normocephalic and atraumatic.  Mouth/Throat: Oropharynx is clear and moist. No oropharyngeal exudate.  Eyes: Conjunctivae and EOM are normal. Pupils are equal, round, and reactive to light.  Neck: Normal range of motion. Neck supple.  No meningismus.  Cardiovascular: Normal rate, regular rhythm, normal heart sounds and intact distal pulses.   No murmur heard. Pulmonary/Chest: Effort normal and breath sounds normal. No respiratory distress.  Abdominal: Soft. There is no tenderness. There is no rebound and no guarding.  Musculoskeletal: Normal range of motion. She exhibits no edema or tenderness.  Neurological: She is alert and oriented to person, place, and time. No cranial nerve deficit. She exhibits normal muscle tone. Coordination normal.  Dysarthria. No aphasia. Subtle left facial droop. Tongue midline. Ataxia on finger to nose on the left, chronic per patient. 4/5 strength of left arm and left leg, chronic per patient. 5/5 strength on the right.  Skin: Skin is warm.  Psychiatric: She has a normal mood and affect. Her behavior is normal.  Nursing note and vitals reviewed.   ED Course  Procedures (including critical care time) Labs Review Labs Reviewed  CBC - Abnormal; Notable for the following:    Platelets 145 (*)    All other components within normal limits  BASIC METABOLIC PANEL - Abnormal; Notable for the following:    Glucose, Bld 103 (*)    GFR calc non Af Amer 85 (*)    All other components  within normal limits  CBG MONITORING, ED - Abnormal; Notable for the following:    Glucose-Capillary 101 (*)    All other components within normal limits  I-STAT CHEM 8, ED - Abnormal; Notable for the following:    Glucose, Bld 104 (*)    Calcium, Ion 1.11 (*)    All other components within normal limits  ETHANOL  PROTIME-INR  APTT  DIFFERENTIAL  URINE RAPID DRUG SCREEN (HOSP PERFORMED)  URINALYSIS, ROUTINE W REFLEX MICROSCOPIC  DIFFERENTIAL  CBG MONITORING, ED  I-STAT TROPOININ, ED    Imaging Review Ct Head Wo Contrast  05/02/2014   CLINICAL DATA:  Code stroke. Slurred speech. Leaning towards the left side. History of TIA, CVA, hypertension and CAD.  EXAM:  CT HEAD WITHOUT CONTRAST  TECHNIQUE: Contiguous axial images were obtained from the base of the skull through the vertex without intravenous contrast.  COMPARISON:  04/25/2009; 04/24/2009; brain MRI - 04/24/2009  FINDINGS: Similar findings of advanced atrophy with sulcal prominence and prominence of the extra-axial spaces about the bilateral parietal cortices. Extensive periventricular hypodensities compatible with microvascular ischemic disease, left greater than right. There is unchanged apparent focal ectasia involving the central aspect of the left MCA (image 12, series 201). Given extensive background parenchymal abnormalities, there is no CT evidence of superimposed acute large territory infarct. No intraparenchymal or extra-axial mass or hemorrhage. Unchanged size and configuration of the ventricles and basilar cisterns. No midline shift.  Nearly confluent opacification of the bilateral anterior and posterior ethmoidal air cells. Air-fluid levels and mucosal thickening are seen with the bilateral maxillary sinuses. The sphenoid and frontal sinuses appear normally aerated. Mastoid air cells are normally aerated. Regional soft tissues appear normal. No displaced calvarial fracture.  IMPRESSION: 1. Similar findings of atrophy and extensive  microvascular ischemic disease without definite superimposed acute intracranial process. 2. Extensive sinus disease, including air-fluid levels within the bilateral maxillary sinuses, as above. Critical Value/emergent results were called by telephone at the time of interpretation on 05/02/2014 at 1:17 pm to Dr. Leonel Ramsay, who verbally acknowledged these results.   Electronically Signed   By: Sandi Mariscal M.D.   On: 05/02/2014 13:23     EKG Interpretation   Date/Time:  Saturday May 02 2014 12:31:22 EDT Ventricular Rate:  62 PR Interval:  128 QRS Duration: 99 QT Interval:  416 QTC Calculation: 422 R Axis:   37 Text Interpretation:  Sinus rhythm Abnormal T, consider ischemia, lateral  leads No significant change was found Confirmed by Wyvonnia Dusky  MD, Deniz Eskridge  [phone]) on 05/02/2014 12:35:01 PM      MDM   Final diagnoses:  None     dysarthria with left-sided weakness and unclear last seen normal.  Patient was seen as code stroke with neurology on arrival. CT head shows stable microvascular seeming change.  Patient's last seen normal was last night. Neurology recommends MRI. If MRI negative, no further stroke workup recommended. Patient is already on aspirin and Plavix. Patient with recent bronchitis which may have exacerbated previous stroke symptoms per neurology. MRI pending at time of sign out to Dr. Aline Brochure.  Ezequiel Essex, MD 05/02/14 [phone]

## 2014-05-02 NOTE — Progress Notes (Signed)
Pt. Arrived via 2 techs and 3 family members. BP high reporting no pain. Calling MD for medicine will continue to monitor. Grandville Silos, Jemina Scahill E 05/02/2014 9:30 PM

## 2014-05-03 DIAGNOSIS — I63032 Cerebral infarction due to thrombosis of left carotid artery: Secondary | ICD-10-CM

## 2014-05-03 DIAGNOSIS — I729 Aneurysm of unspecified site: Secondary | ICD-10-CM

## 2014-05-03 DIAGNOSIS — I10 Essential (primary) hypertension: Secondary | ICD-10-CM

## 2014-05-03 DIAGNOSIS — Z720 Tobacco use: Secondary | ICD-10-CM

## 2014-05-03 DIAGNOSIS — L538 Other specified erythematous conditions: Secondary | ICD-10-CM | POA: Insufficient documentation

## 2014-05-03 DIAGNOSIS — L530 Toxic erythema: Secondary | ICD-10-CM

## 2014-05-03 DIAGNOSIS — R4701 Aphasia: Secondary | ICD-10-CM

## 2014-05-03 LAB — COMPREHENSIVE METABOLIC PANEL
ALBUMIN: 3.6 g/dL (ref 3.5–5.2)
ALT: 20 U/L (ref 0–35)
AST: 29 U/L (ref 0–37)
Alkaline Phosphatase: 42 U/L (ref 39–117)
Anion gap: 6 (ref 5–15)
BUN: 9 mg/dL (ref 6–23)
CHLORIDE: 101 mmol/L (ref 96–112)
CO2: 31 mmol/L (ref 19–32)
CREATININE: 0.79 mg/dL (ref 0.50–1.10)
Calcium: 9.3 mg/dL (ref 8.4–10.5)
GFR calc Af Amer: >90 mL/min (ref >90)
GFR calc non Af Amer: 82 mL/min — ABNORMAL LOW (ref >90)
Glucose, Bld: 123 mg/dL — ABNORMAL HIGH (ref 70–99)
Potassium: 3.8 mmol/L (ref 3.5–5.1)
SODIUM: 138 mmol/L (ref 135–145)
Total Bilirubin: 1.1 mg/dL (ref 0.3–1.2)
Total Protein: 6.9 g/dL (ref 6.0–8.3)

## 2014-05-03 LAB — LIPID PANEL
CHOL/HDL RATIO: 3.8 ratio
Cholesterol: 165 mg/dL (ref 0–200)
HDL: 44 mg/dL (ref >39)
LDL Cholesterol: 75 mg/dL (ref 0–99)
Triglycerides: 231 mg/dL — ABNORMAL HIGH (ref <150)
VLDL: 46 mg/dL — ABNORMAL HIGH (ref 0–40)

## 2014-05-03 MED ORDER — IPRATROPIUM-ALBUTEROL 0.5-2.5 (3) MG/3ML IN SOLN
3.0000 mL | Freq: Four times a day (QID) | RESPIRATORY_TRACT | Status: DC | PRN
  Administered 2014-05-04: 3 mL via RESPIRATORY_TRACT
  Filled 2014-05-03: qty 3

## 2014-05-03 MED ORDER — MOMETASONE FURO-FORMOTEROL FUM 100-5 MCG/ACT IN AERO
2.0000 | INHALATION_SPRAY | Freq: Two times a day (BID) | RESPIRATORY_TRACT | Status: DC
  Filled 2014-05-03 (×2): qty 8.8

## 2014-05-03 MED ORDER — ASPIRIN EC 81 MG PO TBEC
81.0000 mg | DELAYED_RELEASE_TABLET | Freq: Every day | ORAL | Status: DC
  Administered 2014-05-04: 81 mg via ORAL
  Filled 2014-05-03: qty 1

## 2014-05-03 MED ORDER — HYDROCHLOROTHIAZIDE 12.5 MG PO CAPS
12.5000 mg | ORAL_CAPSULE | Freq: Every day | ORAL | Status: DC
  Administered 2014-05-03 – 2014-05-04 (×2): 12.5 mg via ORAL
  Filled 2014-05-03 (×2): qty 1

## 2014-05-03 MED ORDER — ATORVASTATIN CALCIUM 40 MG PO TABS
40.0000 mg | ORAL_TABLET | Freq: Every day | ORAL | Status: DC
  Administered 2014-05-03 – 2014-05-04 (×2): 40 mg via ORAL
  Filled 2014-05-03 (×2): qty 1

## 2014-05-03 MED ORDER — ACETAMINOPHEN 325 MG PO TABS
650.0000 mg | ORAL_TABLET | Freq: Four times a day (QID) | ORAL | Status: DC | PRN
  Administered 2014-05-04: 650 mg via ORAL
  Filled 2014-05-03: qty 2

## 2014-05-03 MED ORDER — IBUPROFEN 200 MG PO TABS: 600.0000 mg | ORAL_TABLET | Freq: Four times a day (QID) | ORAL | Status: DC | PRN

## 2014-05-03 MED ORDER — IPRATROPIUM-ALBUTEROL 0.5-2.5 (3) MG/3ML IN SOLN
3.0000 mL | Freq: Three times a day (TID) | RESPIRATORY_TRACT | Status: DC
  Administered 2014-05-03 (×2): 3 mL via RESPIRATORY_TRACT
  Filled 2014-05-03 (×2): qty 3

## 2014-05-03 NOTE — Evaluation (Addendum)
Occupational Therapy Evaluation Patient Details Name: Jeanne Lawson MRN: [id number] DOB: [birth year] Today's Date: 05/03/2014    History of Present Illness 69 y.o. female with a history of a CVA with residual Left Sided Weakness, Saccular Cerebral Aneurysm, TIAs, CAD, Paroxsmal Atrial Fibrillation, HTN, who presented on 05/02/14 to the ED with complaints of leaning to the left upon awakening in the AM at 7 AM, and later in the AM and was talking to her daughter and she was noticed to have slurring of her speech. Her symptoms began to improve but she still had hesitance of her speech, and some confusion. She was brought to the ED, and evaluated and a CT scan and MRI were performed on 05/02/14 and revealed a Small Infarct at the junction of the posterior superior aspect of the Lenticular Nucleus of the corona radiata as well as enlargement of the known Cerebral Saccular Aneurysm .     Clinical Impression   Pt admitted with above. Pt independent with ADLs, PTA. Feel pt will benefit from acute OT to increase strength prior to d/c and also to give pt fine motor coordination activities for left hand. Pt lives alone.     Follow Up Recommendations  Home health OT;Supervision - Intermittent    Equipment Recommendations  None recommended by OT    Recommendations for Other Services       Precautions / Restrictions Restrictions Weight Bearing Restrictions: No      Mobility Bed Mobility Overal bed mobility: Modified Independent                Transfers Overall transfer level: Needs assistance   Transfers: Sit to/from Stand Sit to Stand: Modified independent (Device/Increase time);Supervision         General transfer comment: cues to try to control descent to simulated regular height toilet.    Balance  Supervision for ambulation with straight cane.                                          ADL Overall ADL's : Needs assistance/impaired                     Lower Body Dressing: Supervision/safety;Sit to/from stand;Set up   Toilet Transfer: Supervision/safety;Ambulation;Regular Toilet (cane)           Functional mobility during ADLs: Supervision/safety;Cane       Vision Pt reports no vision changes from baseline Vision Assessment?: Yes Tracking/Visual Pursuits: Other (comment) (cues for pt to keep head still as she was moving head; lost pen a couple times on left side) Visual Fields: No apparent deficits   Perception     Praxis      Pertinent Vitals/Pain Pain Assessment: Faces Faces Pain Scale: No hurt     Hand Dominance Right   Extremity/Trunk Assessment Upper Extremity Assessment Upper Extremity Assessment: Generalized weakness;LUE deficits/detail LUE Coordination: decreased fine motor (residual from previous CVA)   Lower Extremity Assessment Lower Extremity Assessment: Overall WFL for tasks assessed       Communication Communication Communication: Expressive difficulties   Cognition Arousal/Alertness: Awake/alert Behavior During Therapy: WFL for tasks assessed/performed Overall Cognitive Status: Impaired/Different from baseline (daughter reports off from baseline) Area of Impairment: Problem solving;Attention (per daughter, pt with slow processing, but this seemed [facility] in session)   Current Attention Level:  (pt reported getting distracted with vision testing)  Problem Solving: Slow processing (per daughter)     General Comments       Exercises Exercises: Other exercises Other Exercises Other Exercises: explained some fine motor activities she can be doing for left hand   Shoulder Instructions      Home Living Family/patient expects to be discharged to:: Private residence Living Arrangements: Alone Available Help at Discharge: Family;Friend(s);Available PRN/intermittently Type of Home: House Home Access: Stairs to enter [employer] of Steps: 2 Entrance Stairs-Rails:  Right;Left;Can reach both Home Layout: One level     Bathroom Shower/Tub: Teacher, early years/pre: Standard (sink close)     Home Equipment: Cane - single point;Tub bench;Walker - 2 wheels (thinks she has someone else's BSC)      Lives With: Alone    Prior Functioning/Environment Level of Independence: Independent with assistive device(s)             OT Diagnosis: Generalized weakness   OT Problem List: Decreased strength;Decreased knowledge of use of DME or AE;Decreased coordination   OT Treatment/Interventions: Self-care/ADL training;Therapeutic exercise;DME and/or AE instruction;Visual/perceptual remediation/compensation;Patient/family education;Balance training;Cognitive remediation/compensation;Therapeutic activities    OT Goals(Current goals can be found in the care plan section) Acute Rehab OT Goals Patient Stated Goal: not stated OT Goal Formulation: With patient Time For Goal Achievement: 05/10/14 Potential to Achieve Goals: Good ADL Goals Pt Will Perform Tub/Shower Transfer: Tub transfer;with supervision;ambulating;tub bench (cane) Additional ADL Goal #1: Pt will independently perform HEP to increase strength in bilateral UEs and to increase coordination in left hand.  OT Frequency: Min 2X/week   Barriers to D/C:            Co-evaluation              End of Session Equipment Utilized During Treatment: Gait belt;Other (comment) (cane) Nurse Communication: Mobility status;Other (comment) (daughter wanting to talk to social Development worker, community)  Activity Tolerance: Patient tolerated treatment well Patient left: in bed;with call bell/phone within reach;with family/visitor present   Time: 8127-5170 OT Time Calculation (min): 23 min Charges:  OT General Charges $OT Visit: 1 Procedure OT Evaluation $Initial OT Evaluation Tier I: 1 Procedure G-CodesBenito Mccreedy OTR/L [phone] 05/03/2014, 3:34 PM

## 2014-05-03 NOTE — Evaluation (Signed)
Physical Therapy Evaluation Patient Details Name: Jeanne Lawson MRN: [id number] DOB: 11-06-1945 Today's Date: 05/03/2014   History of Present Illness  69 y.o. female with a history of a CVA with residual Left Sided Weakness, Saccular Cerebral Aneurysm, TIAs, CAD, Paroxsmal AFib, HTN, who presented on 05/02/14 to the ED with complaints of left lateral leaning, slurred speech, and confusion.  CT scan and MRI were performed on 05/02/14 and revealed a Small Infarct at the junction of the posterior superior aspect of the Lenticular Nucleus of the corona radiata as well as enlargement of the known Cerebral Saccular Aneurysm  Clinical Impression  Patient demonstrates modest deficits in functional mobility as indicated below. Will benefit from continued skilled PT to address deficits and maximize function. Spoke with patient and family (daughter) at length regarding their mobility concerns and deficits (they are in agreement that mobility wise patient is at/near baseline, their concern is related to cognition and safety of being alone). Patient able to ambulate well, multi task, recall specific education and cues from other therapist session (stated specific questions asked and responses from earlier OT session) patient able to perform congitive tasks during ambulation without difficulty. Family expressing some concerns regarding patient ability, stated that patient may benefit from initial supervision to ensure safety upon discharge. Do no feel as though patient demonstrates any new persistant deficits or need for long term assist, but do feel patient may benefit from initial supervision and HHPT safety evaluation.       Follow Up Recommendations Home health PT    Equipment Recommendations  None recommended by PT    Recommendations for Other Services       Precautions / Restrictions Restrictions Weight Bearing Restrictions: No      Mobility  Bed Mobility Overal bed mobility: Modified Independent                 Transfers Overall transfer level: Needs assistance Equipment used: Straight cane;None Transfers: Sit to/from Stand Sit to Stand: Modified independent (Device/Increase time);Supervision         General transfer comment: cues to try to control descent to simulated regular height toilet.  Ambulation/Gait Ambulation/Gait assistance: Supervision Ambulation Distance (Feet): 240 Feet Assistive device: Straight cane;None Gait Pattern/deviations: Step-through pattern;Decreased dorsiflexion - left;Narrow base of support Gait velocity: decreased Gait velocity interpretation: Below normal speed for age/gender General Gait Details: patient able to navigate around obstacles in room, obstacles placed in tight quarters in bathroom, patient able to negotiate without difficulty. Patient mobilizing well, no physical assist need.  Stairs            Wheelchair Mobility    Modified Rankin (Stroke Patients Only) Modified Rankin (Stroke Patients Only) Pre-Morbid Rankin Score: Moderately severe disability Modified Rankin: Moderate disability     Balance Overall balance assessment: No apparent balance deficits (not formally assessed) (performed high level tasks without difficulty)                                           Pertinent Vitals/Pain Pain Assessment: No/denies pain Faces Pain Scale: No hurt    Home Living Family/patient expects to be discharged to:: Private residence Living Arrangements: Alone Available Help at Discharge: Family;Friend(s);Available PRN/intermittently Type of Home: House Home Access: Stairs to enter Entrance Stairs-Rails: Right;Left;Can reach both Entrance Stairs-Number of Steps: 2 Home Layout: One level Home Equipment: Cane - single point;Tub bench;Walker - 2 wheels (thinks  she has someone else's BSC)      Prior Function Level of Independence: Independent with assistive device(s)         Comments: uses single  point cane and furniture surfs     Hand Dominance   Dominant Hand: Right    Extremity/Trunk Assessment   Upper Extremity Assessment: Generalized weakness;LUE deficits/detail           Lower Extremity Assessment: LLE deficits/detail   LLE Deficits / Details: residual stroke impairments 6 years     Communication   Communication: Expressive difficulties  Cognition Arousal/Alertness: Awake/alert Behavior During Therapy: WFL for tasks assessed/performed Overall Cognitive Status: Impaired/Different from baseline (daughter reports off from baseline) Area of Impairment: Problem solving;Attention (per daughter, pt with slow processing)   Current Attention Level:  (pt getting distracted with vision testing)         Problem Solving: Slow processing (per daughter)      General Comments General comments (skin integrity, edema, etc.): spoke with patient and family (daughter) at length regarding their mobility concerns and deficits. Patient able to ambulate well, multi task, recall specific education and cues from other therapist session (stated specific questions asked and responses from OT session) patient able to performed congitive tasks during ambulation without difficulty. Family expressing some concerns regarding patient ability, stated that patient may benefit from initial supervision to ensure safety upon discharge. Do no feel as though patient demonstrates any persistant deficits or need for long term assist.     Exercises Other Exercises Other Exercises: eudcated QQ:PYPPJK signs and symtpoms      Assessment/Plan    PT Assessment Patient needs continued PT services  PT Diagnosis Difficulty walking   PT Problem List Decreased activity tolerance;Decreased mobility  PT Treatment Interventions DME instruction;Gait training;Stair training;Functional mobility training;Therapeutic activities;Therapeutic exercise;Balance training;Patient/family education   PT Goals (Current  goals can be found in the Care Plan section) Acute Rehab PT Goals Patient Stated Goal: to go home PT Goal Formulation: With patient/family Time For Goal Achievement: 05/17/14 Potential to Achieve Goals: Good    Frequency Min 3X/week   Barriers to discharge        Co-evaluation               End of Session Equipment Utilized During Treatment: Gait belt Activity Tolerance: Patient tolerated treatment well Patient left: in chair;with call bell/phone within reach;with family/visitor present Nurse Communication: Mobility status         Time: 1540-1609 PT Time Calculation (min) (ACUTE ONLY): 29 min   Charges:   PT Evaluation $Initial PT Evaluation Tier I: 1 Procedure PT Treatments $Gait Training: 8-22 mins   PT G CodesDuncan Dull 05-21-14, 4:19 PM Alben Deeds, [place] DPT  [phone]

## 2014-05-03 NOTE — Progress Notes (Signed)
STROKE TEAM PROGRESS NOTE   HISTORY Jeanne Lawson is a 69 y.o. 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 Her past medical history is also significant for hypertension, paroxysmal atrial fibrillation, previous TIAs, known cerebral aneurysms by MRA in 2011 , ongoing tobacco use, mild coronary artery disease, and a respiratory infection with productive cough but no fever present for approximately 2 weeks and currently being treated with amoxicillin, Robitussin-DM, and an inhaler. The patient spoke with one of her daughters by telephone last night and appeared to be in her normal state of health. This morning one of her daughters called her just after 11 AM and noted that her mother had very slow and dysarthric speech. She sounded so abnormal that at first the daughter thought she had the wrong number. The patient stated that suddenly a bad feeling came over her and she hung up the phone and called 911. Her daughters thought that initially she may have been confused but now they are thinking it may have just been her speech difficulties. They also noted a facial droop which has since resolved. She was brought in as a code stroke. The patient denied any focal weakness. Her initial CT of the head was unremarkable. An MRI/MRA is pending. She has taken aspirin 81 mg daily without fail since her stroke 6 years ago.  Date last known well: Date: 05/01/2014 Time last known well: Unable to determine tPA Given: No: Late presentation and minimal deficits.   SUBJECTIVE (INTERVAL HISTORY) The patient's daughter and granddaughter are at the bedside. They have not noted any improvement in the patient's speech deficits. The patient is feeling some better although some audible wheezing was noted. We discussed the patient's MRI/MRA findings. The workup is in progress. I contacted Dr. [name]  regarding the enlarging cerebral aneurysms after speaking with Dr. Leonel Ramsay, the patient, and her daughter. All are in agreement.   OBJECTIVE Temp:  [97.5 F (36.4 C)-98.4 F (36.9 C)] 98.4 F (36.9 C) (04/10 0600) Pulse Rate:  [50-88] 68 (04/10 0756) Cardiac Rhythm:  [-] Normal sinus rhythm (04/09 2116) Resp:  [14-31] 20 (04/10 0756) BP: (105-198)/(55-99) 152/85 mmHg (04/10 0756) SpO2:  [92 %-99 %] 97 % (04/10 0756) Weight:  [61.236 kg (135 lb)-63.7 kg (140 lb 6.9 oz)] 63.7 kg (140 lb 6.9 oz) (04/09 2101)   Recent Labs Lab 05/02/14 1259  GLUCAP 101*    Recent Labs Lab 05/02/14 1313 05/02/14 1324  NA 140 141  K 3.9 4.0  CL 104 104  CO2 23  --   GLUCOSE 103* 104*  BUN 8 8  CREATININE 0.71 0.70  CALCIUM 9.0  --    No results for input(s): AST, ALT, ALKPHOS, BILITOT, PROT, ALBUMIN in the last 168 hours.  Recent Labs Lab 05/02/14 1313 05/02/14 1324  WBC 6.1  --   NEUTROABS 4.2  4.8  --   HGB 13.8 15.0  HCT 40.0 44.0  MCV 83.3  --   PLT 145*  --    No results for input(s): CKTOTAL, CKMB, CKMBINDEX, TROPONINI in the last 168 hours.  Recent Labs  05/02/14 1313  LABPROT 13.5  INR 1.02    Recent Labs  05/02/14 1500  COLORURINE YELLOW  LABSPEC 1.007  PHURINE 7.0  GLUCOSEU NEGATIVE  HGBUR NEGATIVE  BILIRUBINUR NEGATIVE  KETONESUR NEGATIVE  PROTEINUR NEGATIVE  UROBILINOGEN 0.2  NITRITE NEGATIVE  LEUKOCYTESUR NEGATIVE       Component Value Date/Time   CHOL 146 05/06/2012 1444   TRIG 128.0 05/06/2012 1444   HDL 36.20* 05/06/2012 1444   CHOLHDL 4 05/06/2012 1444   VLDL 25.6 05/06/2012 1444   LDLCALC 84 05/06/2012 1444   Lab Results  Component Value Date   HGBA1C  04/27/2009    5.8 (NOTE) The ADA recommends the following therapeutic goal for glycemic control related to Hgb A1c measurement: Goal of therapy: <6.5 Hgb A1c  Reference: American Diabetes Association: Clinical Practice Recommendations 2010, Diabetes Care, 2010, 33: (Suppl  1).       Component Value Date/Time   LABOPIA NONE DETECTED 05/02/2014 1500   COCAINSCRNUR NONE DETECTED 05/02/2014 1500   LABBENZ NONE DETECTED 05/02/2014 1500   AMPHETMU NONE DETECTED 05/02/2014 1500   THCU NONE DETECTED 05/02/2014 1500   LABBARB NONE DETECTED 05/02/2014 1500     Recent Labs Lab 05/02/14 [place] <5    Dg Chest 2 View 05/02/2014    1. Stable cardiomegaly without failure.  2. Emphysema.       Ct Head Wo Contrast 05/02/2014    1. Similar findings of atrophy and extensive microvascular ischemic disease without definite superimposed acute intracranial process. 2. Extensive sinus disease, including air-fluid levels within the bilateral maxillary sinuses.    Mr Jeanne Lawson Wo Contrast 05/02/2014     MR HEAD   Exam is motion degraded. Sagittal T1 weighted imaging not performed.  Acute nonhemorrhagic small infarct at the junction of the posterior superior aspect of the left lenticular nucleus and the corona radiata.  Remote bilateral centrum semiovale/ corona radiata infarcts. Remote bilateral thalamic and basal ganglia small infarcts. Prominent small vessel disease type changes.  No intracranial hemorrhage.    MR CIRCLE OF Jeanne Lawson   Exam is motion degraded.  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   Moderate narrowing of portions of the A1 segment of the right anterior cerebral artery.  Middle cerebral artery moderate branch vessel narrowing and irregularity.  A2 segment anterior cerebral artery moderate narrowing and irregularity.  Right vertebral artery ends in a posterior inferior cerebellar artery distribution. Mild to moderate  narrowing of portions of the distal right vertebral artery.  Slight irregularity with mild narrowing distal left vertebral artery and basilar artery.   Nonvisualized left posterior inferior cerebellar artery.   Nonvisualized right anterior inferior cerebellar artery.   Fetal type contribution to the right posterior cerebral artery.  Moderate narrowing P1 -2 junction left posterior cerebral artery.    MRA NECK   Evaluation of aortic arch and great vessels limited by motion.  Ectatic common carotid arteries.  No significant stenosis of either carotid bifurcation.  Ectatic vertical cervical segment of the internal carotid artery bilaterally.  Limited evaluation of the origin of the vertebral arteries. Left vertebral artery is dominant. Areas of narrowing, regularity ectasia of vertebral arteries bilaterally.       PHYSICAL EXAM Mental Status: Alert, oriented, thought content appropriate. Speech somewhat slowed and mildly slurred. Able to follow 3 step commands without difficulty. Cranial Nerves: II:  Discs not visualized; Visual fields grossly normal, pupils equal, round, reactive to light and accommodation III,IV, VI: ptosis not present, extra-ocular motions intact bilaterally V,VII: smile symmetric, facial light touch sensation normal bilaterally VIII: hearing normal bilaterally IX,X: gag reflex present XI: bilateral shoulder shrug XII: midline tongue extension Motor: Right :Upper extremity 5/5Left: Upper extremity 4/5 Lower extremity 5/5Lower extremity 5/5 Tone and bulk:normal tone throughout; no atrophy noted Sensory: Light touch intact throughout, bilaterally Deep Tendon Reflexes: 2+ and symmetric throughout Plantars: Right: downgoingLeft: downgoing Cerebellar: normal finger-to-nose with right upper extremity. Dysmetria with left upper  extremity. Normal heel-to-shin test Gait: Deferred     ASSESSMENT/PLAN Jeanne Lawson is a 69 y.o. female with history of previous stroke/TIAs, known cerebral aneurysms, mild coronary artery disease, recent respiratory infection presenting with slow dysarthric speech. She did not receive IV t-PA due to late presentation and minimal deficits.   Stroke: Dominant infarct possibly secondary to small vessel disease.  Resultant  speech deficits  MRI - small acute infarct at the junction left lenticular nucleus and the corona radiata as well as multiple remote infarcts.  MRA - enlargement of a previously known cerebral aneurysms.  Carotid Doppler - please refer to the MRA of the neck.  2D Echo - pending  LDL - pending  HgbA1c pending  Lovenox for VTE prophylaxis  Diet Heart Room service appropriate?: Yes; Fluid consistency:: Thin  aspirin 81 mg orally every day and clopidogrel 75 mg orally every day prior to admission, now on aspirin 81 mg orally every day and clopidogrel 75 mg orally every day  Patient counseled to be compliant with her antithrombotic medications  Ongoing aggressive stroke risk factor management  Therapy recommendations: - Pending  Disposition: - Pending  Hypertension  Home meds: Lisinopril and Toprol-XL  Stable  Hyperlipidemia  Home meds:  No lipid lowering medications prior to admission.   LDL pending, goal < 70  Add  Continue statin at discharge    Other Stroke Risk Factors  Advanced age  Cigarette smoker, advised to stop smoking  Hx stroke/TIA  Family hx strokes / TIAs (mother and grandparents)  Coronary artery disease  Other Active Problems  Enlarging cerebral aneurysms - spoke with Dr. Leonel Ramsay, the patient, and the patient's daughter today. Dr.  [name] has been asked to consult.  The patient's daughter feels her mother needs something for anxiety and depression. Will defer to the  hospitalist.  Other Pertinent  History  History of paroxysmal atrial fibrillation. The patient indicates that she has been under good control as long she has been on digoxin. She is followed by Dr. Mare Ferrari, and she has not been treated with anticoagulant medications previously.  Hospital day # 1  [facility] Pager [phone] 05/03/2014, 8:46 AM   Lenor Coffin [phone]    To contact Stroke Continuity provider, please refer to [email or web address] After hours, contact General Neurology

## 2014-05-03 NOTE — Progress Notes (Signed)
Patient ID: Jeanne Lawson, female   DOB: 05-24-1945, 69 y.o.   MRN: [id number] INR  Consult Note  Asked to see 69 year old RT handed female by,  neurology to evaluate enlarging unruptured  Intracranial aneurysms. Pateint seen with presence of daughters and granddaughter.  Briefly patient admitted yesterday with symptoms of speech difficulty and mild rt sided weakness. MRI of the brain revealed a small lt subcortical ischemic stroke,with evidence of remote ischemic changes. MRA revealed a 9.34mmx 7.73mm RT ICA PCOM region aneurysm,and a LT ICA terminus 36mm x 63mm Aneurysm. Both these have enlarged since the 6 years the patient has known to have had these aneurysms. Has risk factors for increased risk of rupture of HTN , smoking.Marland Kitchen History of Ventricular hypertrophy.. Is on aspirin,and plavix,in addtion to antihypertensives.  On brief neurologic evaluation she does have dysarthria and some word finding difficulties. Mild RT rt sided weakness.  The natural history of unruptured intracranial aneurysms wasd discussed in detail with risk of rupture of 1 to 2 percent per aneurysm,with  Associated sig mortality and morbidity.. Increased risk of rupture with the above risk factors  Were also reviewed. The fact that the aneurysms are enlarging suggests these to be unstable and  Adds to the increased risk of rupture.  Options of treatment to secure the aneurysms were reviewed..Endovascular treatment was discussed in detail.Marland KitchenOption of surgical clipping also reviewed.  Patient and family are amenable to endovascular treatment. This will be scheduled  to be done in 7 to 10days post ischemic stroke to allow the patient to recuperate. Also patient strongly advised to stop smoking.. Patient and family were asked to call for questions. Will follow patient peripherally whilst in patient. Thanks for the consult. DR [name] MDt

## 2014-05-03 NOTE — Progress Notes (Signed)
[facility] PROGRESS NOTE  Jeanne Lawson FGH:[id number] DOB: 07-Jan-1946 DOA: 05/02/2014 PCP: [name], MD  Brief Summary  Jeanne Lawson is a 69 y.o. female with a history of a CVA with residual Left Sided Weakness, Saccular Cerebral Aneurysm, TIAs, CAD, Paroxsmal Atrial Fibrillation, HTN, who presents to the ED with complaints of leaning to the left upon awakening in the AM at 7 AM, and later in the AM was talking to her daughter and she was noticed to have slurring of her speech. Her symptoms began to improve but she still had slow speech and some confusion. She was brought to the ED, and evaluated and a CT scan and MRI were performed which revealed a Small Infarct at the junction of the posterior superior aspect of the Lenticular Nucleus of the corona radiata as well as enlargement of her previous aneurysms.  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  Emphysema on CXR -  Advised to quit smoking -  Start duonebs, dulera  Palmar erythema, may be due to side effect of medication but usually caused by cirrhosis.  Has some telangiectasias as well.  Denies alcohol use and alcohol level was normal at admission -  RUQ -  Alcohol level was negative -  Check CMP, hepatitis B&C panel, and ammonia level  Tobacco abuse -  Counseled cessation -  Continue nicotine patch  Grief reaction, tearful.  States prior to this hospitalization she did not feel depressed  Diet:  Healthy heart Access:  PIV IVF:  Off Proph:  Lovenox  Code Status: Full Family Communication: Patient and her extended family Disposition Plan: Pending echocardiogram, PT/OT. She will need follow-up with neuro interventionalists for treatment of her to aneurysms.  From home alone.  May need rehabiliation   Consultants:  Neuro interventionalist  Neurology  Procedures:  CT head  MRA brain  MRA neck  MRI brain  Antibiotics:  none  HPI/Subjective:  Feels sad  Objective: Filed Vitals:   05/03/14 0600 05/03/14 0756 05/03/14 0946 05/03/14 1330  BP: 156/85 152/85 150/99 156/67  Pulse: 74 68 73 60  Temp: 98.4 F (36.9 C)   98.3 F (36.8 C)  TempSrc: Oral   Oral  Resp: 22 20 22 20   Height:      Weight:  SpO2: 95% 97% 97% 95%   No intake or output data in the 24 hours ending 05/03/14 1428 Filed Weights   05/02/14 1231 05/02/14 2101  Weight: 61.236 kg (135 lb) 63.7 kg (140 lb 6.9 oz)    Exam:   General:  Average weight female, No acute distress  HEENT:  NCAT, MMM  Cardiovascular:  RRR, nl S1, S2 no mrg, 2+ pulses, warm extremities  Respiratory:  Full expiratory wheeze, no rales or rhonchi, no increased WOB  Abdomen:   NABS, soft, NT/ND  MSK:   Normal tone and bulk, no LEE  Neuro:  CN II-XII grossly intact, strength 5-/5 LUE and LLE, mild  aphasia  Data Reviewed: Basic Metabolic Panel:  Recent Labs Lab 05/02/14 1313 05/02/14 1324  NA 140 141  K 3.9 4.0  CL 104 104  CO2 23  --   GLUCOSE 103* 104*  BUN 8 8  CREATININE 0.71 0.70  CALCIUM 9.0  --    Liver Function Tests: No results for input(s): AST, ALT, ALKPHOS, BILITOT, PROT, ALBUMIN in the last 168 hours. No results for input(s): LIPASE, AMYLASE in the last 168 hours. No results for input(s): AMMONIA in the last 168 hours. CBC:  Recent Labs Lab 05/02/14 1313 05/02/14 1324  WBC 6.1  --   NEUTROABS 4.2  4.8  --   HGB 13.8 15.0  HCT 40.0 44.0  MCV 83.3  --   PLT 145*  --    Cardiac Enzymes: No results for input(s): CKTOTAL, CKMB, CKMBINDEX, TROPONINI in the last 168 hours. BNP (last 3 results) No results for input(s): BNP in the last 8760 hours.  ProBNP (last 3 results) No results for input(s): PROBNP in the last 8760 hours.  CBG:  Recent Labs Lab 05/02/14 1259  GLUCAP 101*    No results found for this or any previous visit (from the past 240 hour(s)).   Studies: Dg Chest 2 View  05/02/2014   CLINICAL DATA:  Productive cough. Shortness of breath. History smoking 2-3 cigarettes per day.  EXAM: CHEST - 2 VIEW  COMPARISON:  Two-view chest x-ray 04/22/2014.  FINDINGS: Emphysematous changes are again noted. The heart is enlarged. There is no edema or effusion to suggest failure. No focal airspace disease is evident. The visualized soft tissues and bony thorax are unremarkable.  IMPRESSION: 1. Stable cardiomegaly without failure. 2. Emphysema.   Electronically Signed   By: San Morelle M.D.   On: 05/02/2014 19:34   Ct Head Wo Contrast  05/02/2014   CLINICAL DATA:  Code stroke. Slurred speech. Leaning towards the left side. History of TIA, CVA, hypertension and CAD.  EXAM: CT HEAD WITHOUT CONTRAST  TECHNIQUE: Contiguous axial images were obtained from the base of the skull through the vertex without intravenous contrast.  COMPARISON:  04/25/2009;  04/24/2009; brain MRI - 04/24/2009  FINDINGS: Similar findings of advanced atrophy with sulcal prominence and prominence of the extra-axial spaces about the bilateral parietal cortices. Extensive periventricular hypodensities compatible with microvascular ischemic disease, left greater than right. There is unchanged apparent focal ectasia involving the central aspect of the left MCA (image 12, series 201). Given extensive background parenchymal abnormalities, there is no CT evidence of superimposed acute large territory infarct. No intraparenchymal or extra-axial mass or hemorrhage. Unchanged size and configuration of the ventricles and basilar cisterns. No midline shift.  Nearly confluent opacification of the bilateral anterior and posterior ethmoidal air cells. Air-fluid levels and mucosal thickening are seen with the bilateral maxillary sinuses. The sphenoid  and frontal sinuses appear normally aerated. Mastoid air cells are normally aerated. Regional soft tissues appear normal. No displaced calvarial fracture.  IMPRESSION: 1. Similar findings of atrophy and extensive microvascular ischemic disease without definite superimposed acute intracranial process. 2. Extensive sinus disease, including air-fluid levels within the bilateral maxillary sinuses, as above. Critical Value/emergent results were called by telephone at the time of interpretation on 05/02/2014 at 1:17 pm to Dr. Leonel Ramsay, who verbally acknowledged these results.   Electronically Signed   By: Sandi Mariscal M.D.   On: 05/02/2014 13:23   Mr Virgel Paling Wo Contrast  05/02/2014   CLINICAL DATA:  69 year old hypertensive female with history of atrial fibrillation and known cerebral aneurysms presenting with abnormal speech and transient facial droop. Initial encounter.  EXAM: MR HEAD WITHOUT CONTRAST  MR CIRCLE OF WILLIS WITHOUT CONTRAST  MRA OF THE NECK WITHOUT AND WITH CONTRAST  TECHNIQUE: Multiplanar, multiecho pulse sequences of the brain and surrounding  structures were obtained according to standard protocol without intravenous contrast.; Angiographic images of the Circle of Willis were obtained using MRA technique without intravenous contrast.; Multiplanar and multiecho pulse sequences of the neck were obtained without and with intravenous contrast. Angiographic images of the neck were obtained using MRA technique without and with intravenous contast.  CONTRAST:  104mL MULTIHANCE GADOBENATE DIMEGLUMINE 529 MG/ML IV SOLN  COMPARISON:  05/02/2014 head CT. 04/24/2009 brain MR and MR angiogram.  FINDINGS: MR HEAD FINDINGS  Exam is motion degraded. Sagittal T1 weighted imaging not performed.  Acute nonhemorrhagic small infarct at the junction of the posterior superior aspect of the left lenticular nucleus and the corona radiata.  Remote bilateral centrum semiovale/ corona radiata infarcts. Remote bilateral thalamic and basal ganglia small infarcts. Prominent small vessel disease type changes.  No intracranial hemorrhage.  Global atrophy without hydrocephalus.  No intracranial mass lesion noted on this unenhanced exam.  Aneurysms as discussed below.  MR CIRCLE OF WILLIS FINDINGS  Exam is motion degraded.  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  Moderate narrowing of portions of the A1 segment of the right anterior cerebral artery.  Middle cerebral artery moderate branch vessel narrowing and irregularity.  A2 segment anterior cerebral artery moderate narrowing and irregularity.  Right vertebral artery ends in a posterior inferior cerebellar artery distribution. Mild to moderate narrowing of portions of the distal right  vertebral artery.  Slight irregularity with mild narrowing distal left vertebral artery and basilar artery.  Nonvisualized left posterior inferior cerebellar artery.  Nonvisualized right anterior inferior cerebellar artery.  Fetal type contribution to the right posterior cerebral artery.  Moderate narrowing P1 -2 junction left posterior cerebral artery.  MRA NECK FINDINGS  Evaluation of aortic arch and great vessels limited by motion. Two vessel aortic arch.  Ectatic common carotid arteries.  No significant stenosis of either carotid bifurcation.  Ectatic vertical cervical segment of the internal carotid artery bilaterally.  Limited evaluation of the origin of the vertebral arteries. Left vertebral artery is dominant. Areas of narrowing, regularity ectasia of vertebral arteries bilaterally.  IMPRESSION: MR HEAD  Exam is motion degraded. Sagittal T1 weighted imaging not  performed.  Acute nonhemorrhagic small infarct at the junction of the posterior superior aspect of the left lenticular nucleus and the corona radiata.  Remote bilateral centrum semiovale/ corona radiata infarcts. Remote bilateral thalamic and basal ganglia small infarcts. Prominent small vessel disease type changes.  No intracranial hemorrhage.  MR CIRCLE OF WILLIS  Exam is motion degraded.  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  Moderate narrowing of portions of the A1 segment of the right anterior cerebral artery.  Middle cerebral artery moderate branch vessel narrowing and irregularity.  A2 segment anterior cerebral artery moderate narrowing and irregularity.  Right vertebral artery ends in a  posterior inferior cerebellar artery distribution. Mild to moderate narrowing of portions of the distal right vertebral artery.  Slight irregularity with mild narrowing distal left vertebral artery and basilar artery.  Nonvisualized left posterior inferior cerebellar artery.  Nonvisualized right anterior inferior cerebellar artery.  Fetal type contribution to the right posterior cerebral artery.  Moderate narrowing P1 -2 junction left posterior cerebral artery.  MRA NECK  Evaluation of aortic arch and great vessels limited by motion.  Ectatic common carotid arteries.  No significant stenosis of either carotid bifurcation.  Ectatic vertical cervical segment of the internal carotid artery bilaterally.  Limited evaluation of the origin of the vertebral arteries. Left vertebral artery is dominant. Areas of narrowing, regularity ectasia of vertebral arteries bilaterally.   Electronically Signed   By: Genia Del M.D.   On: 05/02/2014 18:20   Mr Angiogram Neck W Wo Contrast  05/02/2014   CLINICAL DATA:  69 year old hypertensive female with history of atrial fibrillation and known cerebral aneurysms presenting with abnormal speech and transient facial droop. Initial encounter.  EXAM: MR HEAD WITHOUT CONTRAST  MR CIRCLE OF WILLIS WITHOUT CONTRAST  MRA OF THE NECK WITHOUT AND WITH CONTRAST  TECHNIQUE: Multiplanar, multiecho pulse sequences of the brain and surrounding structures were obtained according to standard protocol without intravenous contrast.; Angiographic images of the Circle of Willis were obtained using MRA technique without intravenous contrast.; Multiplanar and multiecho pulse sequences of the neck were obtained without and with intravenous contrast. Angiographic images of the neck were obtained using MRA technique without and with intravenous contast.  CONTRAST:  20mL MULTIHANCE GADOBENATE DIMEGLUMINE 529 MG/ML IV SOLN  COMPARISON:  05/02/2014 head CT. 04/24/2009 brain MR and MR angiogram.  FINDINGS: MR  HEAD FINDINGS  Exam is motion degraded. Sagittal T1 weighted imaging not performed.  Acute nonhemorrhagic small infarct at the junction of the posterior superior aspect of the left lenticular nucleus and the corona radiata.  Remote bilateral centrum semiovale/ corona radiata infarcts. Remote bilateral thalamic and basal ganglia small infarcts. Prominent small vessel disease type changes.  No intracranial hemorrhage.  Global atrophy without hydrocephalus.  No intracranial mass lesion noted on this unenhanced exam.  Aneurysms as discussed below.  MR CIRCLE OF WILLIS FINDINGS  Exam is motion degraded.  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  Aneurysm arising from the right posterior communicating  artery region has increased in size now measuring 9 x 7 x 6 mm versus prior 6 x 5 x 6 mm  Fusiform ectasia of the right internal carotid artery distal vertical segment similar to prior exam.  Moderate narrowing of portions of the A1 segment of the right anterior cerebral artery.  Middle cerebral artery moderate branch vessel narrowing and irregularity.  A2 segment anterior cerebral artery moderate narrowing and irregularity.  Right vertebral artery ends in a posterior inferior cerebellar artery distribution. Mild to moderate narrowing of portions of the distal right vertebral artery.  Slight irregularity with mild narrowing distal left vertebral artery and basilar artery.  Nonvisualized left posterior inferior cerebellar artery.  Nonvisualized right anterior inferior cerebellar artery.  Fetal type contribution to the right posterior cerebral artery.  Moderate narrowing P1 -2 junction left posterior cerebral artery.  MRA NECK FINDINGS  Evaluation of aortic arch and great vessels limited by motion. Two vessel aortic arch.  Ectatic common carotid arteries.  No significant stenosis of either carotid bifurcation.  Ectatic  vertical cervical segment of the internal carotid artery bilaterally.  Limited evaluation of the origin of the vertebral arteries. Left vertebral artery is dominant. Areas of narrowing, regularity ectasia of vertebral arteries bilaterally.  IMPRESSION: MR HEAD  Exam is motion degraded. Sagittal T1 weighted imaging not performed.  Acute nonhemorrhagic small infarct at the junction of the posterior superior aspect of the left lenticular nucleus and the corona radiata.  Remote bilateral centrum semiovale/ corona radiata infarcts. Remote bilateral thalamic and basal ganglia small infarcts. Prominent small vessel disease type changes.  No intracranial hemorrhage.  MR CIRCLE OF WILLIS  Exam is motion degraded.  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  Moderate narrowing of portions of the A1 segment of the right anterior cerebral artery.  Middle cerebral artery moderate branch vessel narrowing and irregularity.  A2 segment anterior cerebral artery moderate narrowing and irregularity.  Right vertebral artery ends in a posterior inferior cerebellar artery distribution. Mild to moderate narrowing of portions of the distal right vertebral artery.  Slight irregularity with mild narrowing distal left vertebral artery and basilar artery.  Nonvisualized left posterior inferior cerebellar artery.  Nonvisualized right anterior inferior cerebellar artery.  Fetal type contribution to the right posterior cerebral artery.  Moderate narrowing P1 -2 junction left posterior cerebral artery.  MRA NECK  Evaluation of aortic arch and great vessels limited by motion.  Ectatic common carotid  arteries.  No significant stenosis of either carotid bifurcation.  Ectatic vertical cervical segment of the internal carotid artery bilaterally.  Limited evaluation of the origin of the vertebral arteries. Left vertebral artery is dominant. Areas of narrowing, regularity ectasia of vertebral arteries bilaterally.   Electronically Signed   By: Genia Del M.D.   On: 05/02/2014 18:20   Mr Brain Wo Contrast  05/02/2014   CLINICAL DATA:  69 year old hypertensive female with history of atrial fibrillation and known cerebral aneurysms presenting with abnormal speech and transient facial droop. Initial encounter.  EXAM: MR HEAD WITHOUT CONTRAST  MR CIRCLE OF WILLIS WITHOUT CONTRAST  MRA OF THE NECK WITHOUT AND  WITH CONTRAST  TECHNIQUE: Multiplanar, multiecho pulse sequences of the brain and surrounding structures were obtained according to standard protocol without intravenous contrast.; Angiographic images of the Circle of Willis were obtained using MRA technique without intravenous contrast.; Multiplanar and multiecho pulse sequences of the neck were obtained without and with intravenous contrast. Angiographic images of the neck were obtained using MRA technique without and with intravenous contast.  CONTRAST:  23mL MULTIHANCE GADOBENATE DIMEGLUMINE 529 MG/ML IV SOLN  COMPARISON:  05/02/2014 head CT. 04/24/2009 brain MR and MR angiogram.  FINDINGS: MR HEAD FINDINGS  Exam is motion degraded. Sagittal T1 weighted imaging not performed.  Acute nonhemorrhagic small infarct at the junction of the posterior superior aspect of the left lenticular nucleus and the corona radiata.  Remote bilateral centrum semiovale/ corona radiata infarcts. Remote bilateral thalamic and basal ganglia small infarcts. Prominent small vessel disease type changes.  No intracranial hemorrhage.  Global atrophy without hydrocephalus.  No intracranial mass lesion noted on this unenhanced exam.  Aneurysms as discussed below.  MR CIRCLE OF WILLIS  FINDINGS  Exam is motion degraded.  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  Moderate narrowing of portions of the A1 segment of the right anterior cerebral artery.  Middle cerebral artery moderate branch vessel narrowing and irregularity.  A2 segment anterior cerebral artery moderate narrowing and irregularity.  Right vertebral artery ends in a posterior inferior cerebellar artery distribution. Mild to moderate narrowing of portions of the distal right vertebral artery.  Slight irregularity with mild narrowing distal left vertebral artery and basilar artery.  Nonvisualized left posterior inferior cerebellar artery.  Nonvisualized right anterior inferior cerebellar artery.  Fetal type contribution to the right posterior cerebral artery.  Moderate narrowing P1 -2 junction left posterior cerebral artery.  MRA NECK FINDINGS  Evaluation of aortic arch and great vessels limited by motion. Two vessel aortic arch.  Ectatic common carotid arteries.  No significant stenosis of either carotid bifurcation.  Ectatic vertical cervical segment of the internal carotid artery bilaterally.  Limited evaluation of the origin of the vertebral arteries. Left vertebral artery is dominant. Areas of narrowing, regularity ectasia of vertebral arteries bilaterally.  IMPRESSION: MR HEAD  Exam is motion degraded. Sagittal T1 weighted imaging not performed.  Acute nonhemorrhagic small infarct at the junction of the posterior superior aspect of the left lenticular nucleus and the corona radiata.  Remote bilateral centrum semiovale/ corona radiata infarcts. Remote bilateral thalamic and basal ganglia  small infarcts. Prominent small vessel disease type changes.  No intracranial hemorrhage.  MR CIRCLE OF WILLIS  Exam is motion degraded.  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  Moderate narrowing of portions of the A1 segment of the right anterior cerebral artery.  Middle cerebral artery moderate branch vessel narrowing and irregularity.  A2 segment anterior cerebral artery moderate narrowing and irregularity.  Right vertebral artery ends in a posterior inferior cerebellar artery distribution. Mild to moderate narrowing of portions of the distal right vertebral artery.  Slight irregularity with mild narrowing distal left vertebral artery and basilar artery.  Nonvisualized left posterior inferior cerebellar artery.  Nonvisualized right anterior inferior cerebellar artery.  Fetal type contribution to the right posterior cerebral artery.  Moderate narrowing P1 -2 junction left posterior cerebral artery.  MRA NECK  Evaluation of aortic arch and great vessels limited by motion.  Ectatic common carotid arteries.  No significant stenosis of either carotid bifurcation.  Ectatic vertical cervical segment of the internal carotid artery bilaterally.  Limited evaluation of the origin of the vertebral arteries. Left vertebral artery is dominant. Areas of narrowing, regularity ectasia of vertebral arteries bilaterally.   Electronically Signed   By: Genia Del M.D.   On: 05/02/2014 18:20    Scheduled Meds: . aspirin EC  81 mg Oral QODAY  . clopidogrel  75 mg Oral Daily  . digoxin  250 mcg Oral Daily  . enoxaparin (LOVENOX) injection  40 mg Subcutaneous Q24H  . lisinopril   20 mg Oral Daily  . metoprolol succinate  25 mg Oral BID  . nicotine  7 mg Transdermal Q24H  . pneumococcal 23 valent vaccine  0.5 mL Intramuscular Tomorrow-1000   Continuous Infusions:   Principal Problem:   CVA (cerebral infarction) Active Problems:   Hypertension   Tobacco abuse   Chronic ischemic heart disease   Aneurysm   Expressive aphasia   Saccular aneurysm    Time spent: 30 min    Jaimey Franchini, [facility] Pager [phone]. If 7PM-7AM, please contact night-coverage at [email or web address], password [facility] 05/03/2014, 2:28 PM  LOS: 1 day

## 2014-05-03 NOTE — Evaluation (Signed)
Speech Language Pathology Evaluation Patient Details Name: Jeanne Lawson MRN: [id number] DOB: 1945-08-17 Today's Date: 05/03/2014 Time: 0940-7680 SLP Time Calculation (min) (ACUTE ONLY): 30 min  Problem List:  Patient Active Problem List   Diagnosis Date Noted  . CVA (cerebral infarction) 05/02/2014  . Aneurysm 05/02/2014  . Expressive aphasia 05/02/2014  . 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 Tobacco abuse 10/24/2010  . TIA (transient ischemic attack)   . Hypertension   . Ventricular hypertrophy   . CVA 02/23/2010  . STRESS FRACTURE, FOOT 02/22/2010   Past Medical History:  Past Medical History  Diagnosis Date  . Ventricular hypertrophy 04/2009    LVH with diastolic dysfunction by echo. Has normal EF.  Marland Kitchen Pancreatitis     x2  . PAF (paroxysmal atrial fibrillation)   . TIA (transient ischemic attack)     She was hospitalized 04-23-09 through 04-27-09 for involving right side of the body  . 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 Hypertension   . 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  . Tobacco abuse     Ongoing   . Edema of foot     She has a history of chronic edema of the left dated back to age 3 when she suufered severe frostbite playing  on the snow as a child  . Coronary artery disease 1996    Known with prior mild lesion of LAD demonstrated by Cardiac Catheterization in 1996   Past Surgical History:  Past Surgical History  Procedure Laterality Date  . 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 Neck surgery  50 yrs ago    Left side  . Cardiac catheterization  1996    Mild CAD with vasospasm  . US echocardiography  04-26-2009    EF 65-70%  . Cardiovascular stress test  12-02-2001    EF 70%   HPI:  Jeanne Lawson is a 69 y.o. female with a history of a CVA with  residual Left Sided Weakness, Saccular Cerebral Aneurysm, TIAs, CAD, Paroxsmal Atrial Fibrillation, HTN, who presented on 05/02/14 to the ED with complaints of leaning to the left upon awakening in the AM at 7 AM, and later in the AM and was talking to her daughter and she was noticed to have slurring of her speech. Her symptoms began to improve but she still had hesitance of her speech, and some confusion. She was brought to the ED, and evaluated and a CT scan and MRI were performed on 05/02/14 and revealed a Small Infarct at the junction of the posterior superior aspect of the Lenticular Nucleus of the corona radiata as well as enlargement of the known Cerebral Saccular Aneurysm .    Assessment / Plan / Recommendation Clinical Impression   Pt exhibits mild flaccid dysarthria characterized by slower rate of speech, decreased articulation accuracy and minimal telegraphic type speech during conversational tasks; her family stated her speech is "very different than before" regarding rate and clarity.  She also exhibited some difficulty with reasoning regarding needs when discharged, but with some explanation, she agreed she would benefit from some assistance.  Emotional lability noted as well during evaluation, but with upcoming surgery pending, this could be contributory. Auditory comprehension and other cognitive tasks appeared  within functional limits during SLE.    SLP Assessment  Patient needs continued Speech Language Pathology Services    Follow Up Recommendations  Home health SLP;Other (comment) (depending on family availability; may need SNF)    Frequency and Duration min 2x/week  1 week   Pertinent Vitals/Pain Pain Assessment: No/denies pain   SLP Goals  Patient/Family Stated Goal: increase rate of speech accuracy Potential to Achieve Goals (ACUTE ONLY): Good  SLP Evaluation Prior Functioning  Cognitive/Linguistic Baseline: Within functional limits Type of Home: House  Lives With:  Alone Available Help at Discharge: Family;Friend(s);Available PRN/intermittently Education: High School Vocation: Full time employment   Cognition  Overall Cognitive Status: Impaired/Different from baseline Arousal/Alertness: Awake/alert Orientation Level: Oriented X4 Memory: Appears intact Awareness: Impaired Awareness Impairment: Anticipatory impairment Problem Solving: Appears intact Executive Function: Reasoning (affected intermittently per family) Reasoning: Impaired Reasoning Impairment: Verbal complex;Functional complex Behaviors: Lability Safety/Judgment: Appears intact    Comprehension  Auditory Comprehension Overall Auditory Comprehension: Appears within functional limits for tasks assessed Yes/No Questions: Within Functional Limits Commands: Within Functional Limits Conversation: Complex Visual Recognition/Discrimination Discrimination: Within Function Limits Reading Comprehension Reading Status: Within funtional limits    Expression Expression Primary Mode of Expression: Verbal Verbal Expression Overall Verbal Expression: Appears within functional limits for tasks assessed Initiation: No impairment Level of Generative/Spontaneous Verbalization: Conversation Repetition: No impairment Naming: No impairment Pragmatics: No impairment Non-Verbal Means of Communication: Not applicable Written Expression Dominant Hand: Right Written Expression: Not tested   Oral / Motor Oral Motor/Sensory Function Overall Oral Motor/Sensory Function: Impaired Labial ROM: Reduced left Labial Symmetry: Abnormal symmetry left Labial Strength: Within Functional Limits Labial Sensation: Reduced Lingual ROM: Reduced left Lingual Symmetry: Abnormal symmetry left Lingual Strength: Within Functional Limits Lingual Sensation: Reduced Facial ROM: Reduced left Facial Symmetry: Within Functional Limits Facial Strength: Within Functional Limits Facial Sensation: Reduced Velum: Within  Functional Limits Mandible: Within Functional Limits Motor Speech Overall Motor Speech: Appears within functional limits for tasks assessed Respiration: Within functional limits Phonation: Low vocal intensity;Other (comment) (family commented she was dx with emphysema) Resonance: Within functional limits Articulation: Impaired Level of Impairment: Sentence Intelligibility: Intelligibility reduced Word: 75-100% accurate Phrase: 50-74% accurate Sentence: 50-74% accurate Conversation: 50-74% accurate Motor Planning: Witnin functional limits Motor Speech Errors: Not applicable Effective Techniques: Slow rate;Over-articulate;Increased vocal intensity        ADAMS,PAT, M.S., CCC-SLP 05/03/2014, 12:01 PM

## 2014-05-04 ENCOUNTER — Encounter (HOSPITAL_COMMUNITY): Payer: Self-pay | Admitting: Nurse Practitioner

## 2014-05-04 ENCOUNTER — Inpatient Hospital Stay (HOSPITAL_COMMUNITY): Payer: Medicare Other

## 2014-05-04 DIAGNOSIS — I259 Chronic ischemic heart disease, unspecified: Secondary | ICD-10-CM

## 2014-05-04 DIAGNOSIS — I6789 Other cerebrovascular disease: Secondary | ICD-10-CM

## 2014-05-04 LAB — CBC
HEMATOCRIT: 40.6 % (ref 36.0–46.0)
HEMOGLOBIN: 13.7 g/dL (ref 12.0–15.0)
MCH: 28.9 pg (ref 26.0–34.0)
MCHC: 33.7 g/dL (ref 30.0–36.0)
MCV: 85.7 fL (ref 78.0–100.0)
Platelets: 128 10*3/uL — ABNORMAL LOW (ref 150–400)
RBC: 4.74 MIL/uL (ref 3.87–5.11)
RDW: 14 % (ref 11.5–15.5)
WBC: 5.4 10*3/uL (ref 4.0–10.5)

## 2014-05-04 LAB — BASIC METABOLIC PANEL
Anion gap: 6 (ref 5–15)
BUN: 11 mg/dL (ref 6–23)
CHLORIDE: 105 mmol/L (ref 96–112)
CO2: 28 mmol/L (ref 19–32)
Calcium: 8.8 mg/dL (ref 8.4–10.5)
Creatinine, Ser: 0.72 mg/dL (ref 0.50–1.10)
GFR calc Af Amer: >90 mL/min (ref >90)
GFR, EST NON AFRICAN AMERICAN: 85 mL/min — AB (ref >90)
Glucose, Bld: 104 mg/dL — ABNORMAL HIGH (ref 70–99)
POTASSIUM: 4.2 mmol/L (ref 3.5–5.1)
Sodium: 139 mmol/L (ref 135–145)

## 2014-05-04 LAB — HEPATITIS B SURFACE ANTIGEN: Hepatitis B Surface Ag: NEGATIVE

## 2014-05-04 LAB — HIV ANTIBODY (ROUTINE TESTING W REFLEX): HIV SCREEN 4TH GENERATION: NONREACTIVE

## 2014-05-04 LAB — HEPATITIS C ANTIBODY: HCV AB: NEGATIVE

## 2014-05-04 LAB — HEMOGLOBIN A1C
Hgb A1c MFr Bld: 5.6 % (ref 4.8–5.6)
MEAN PLASMA GLUCOSE: 114 mg/dL

## 2014-05-04 MED ORDER — ATORVASTATIN CALCIUM 40 MG PO TABS: 40.0000 mg | ORAL_TABLET | Freq: Every day | ORAL | Status: DC

## 2014-05-04 MED ORDER — LISINOPRIL-HYDROCHLOROTHIAZIDE 20-12.5 MG PO TABS: 1.0000 | ORAL_TABLET | Freq: Every day | ORAL | Status: DC

## 2014-05-04 MED ORDER — NICOTINE 14 MG/24HR TD PT24: 14.0000 mg | MEDICATED_PATCH | TRANSDERMAL | Status: DC

## 2014-05-04 MED ORDER — NICOTINE 7 MG/24HR TD PT24
7.0000 mg | MEDICATED_PATCH | Freq: Every day | TRANSDERMAL | Status: DC
Start: 2014-05-04 — End: 2014-05-15

## 2014-05-04 MED ORDER — MOMETASONE FURO-FORMOTEROL FUM 100-5 MCG/ACT IN AERO: 2.0000 | INHALATION_SPRAY | Freq: Two times a day (BID) | RESPIRATORY_TRACT | Status: DC

## 2014-05-04 NOTE — Progress Notes (Signed)
Pt Discharged, discharge instruction gone over with pt and copy provided to pt, pt assisted to wheelchair and transported to waiting POV at main entrance w/o incident.

## 2014-05-04 NOTE — Progress Notes (Signed)
Occupational Therapy Treatment Patient Details Name: Jeanne Lawson MRN: [id number] DOB: 1945/04/13 Today's Date: 05/04/2014    History of present illness 69 y.o. female with a history of a CVA with residual Left Sided Weakness, Saccular Cerebral Aneurysm, TIAs, CAD, Paroxsmal AFib, HTN, who presented on 05/02/14 to the ED with complaints of left lateral leaning, slurred speech, and confusion.  CT scan and MRI were performed on 05/02/14 and revealed a Small Infarct at the junction of the posterior superior aspect of the Lenticular Nucleus of the corona radiata as well as enlargement of the known Cerebral Saccular Aneurysm   OT comments  Pt. Progressing well with skilled OT and is clear for d/c from our services.  Is able to complete UB/LB ADLS including toileting and tub transfer at S level.  Also provided theraband for pt. To continue with UB strengthening as part of a HEP program.  Will alert OTR/L pt. Clear for d/c  Follow Up Recommendations  Home health OT;Supervision - Intermittent    Equipment Recommendations  None recommended by OT    Recommendations for Other Services      Precautions / Restrictions         Mobility Bed Mobility                  Transfers Overall transfer level: Needs assistance Equipment used: Straight cane;None Transfers: Sit to/from Stand;Stand Pivot Transfers Sit to Stand: Modified independent (Device/Increase time);Supervision              Balance                                   ADL Overall ADL's : Needs assistance/impaired     Grooming: Wash/dry hands;Wash/dry face;Oral care;Applying deodorant;Brushing hair;Supervision/safety;Standing   Upper Body Bathing: Supervision/ safety;Set up;Standing   Lower Body Bathing: Supervison/ safety;Sit to/from stand;Sitting/lateral leans Lower Body Bathing Details (indicate cue type and reason): encouraged to sit when able but pt. continued to stand throughout all bathing tasks, did  have ue support on counter and was able to cross leg over knee while in standing with no LOB noted Upper Body Dressing : Minimal assistance;Standing Upper Body Dressing Details (indicate cue type and reason): to tie gown Lower Body Dressing: Supervision/safety;Sit to/from stand;Set up   Toilet Transfer: Supervision/safety;Ambulation;Regular Toilet   Toileting- Water quality scientist and Hygiene: Supervision/safety;Sit to/from stand   Tub/ Shower Transfer: Tub transfer;Supervision/safety;Ambulation;Grab bars   Functional mobility during ADLs: Supervision/safety;Cane        Vision                     Perception     Praxis      Cognition   Behavior During Therapy: [facility] for tasks assessed/performed                         Extremity/Trunk Assessment               Exercises  provided level 1 theraband for HEP for B UES.   Shoulder Instructions       General Comments      Pertinent Vitals/ Pain       Pain Assessment: No/denies pain  Home Living  Prior Functioning/Environment              Frequency Min 2X/week     Progress Toward Goals  OT Goals(current goals can now be found in the care plan section)  Progress towards OT goals: Goals met/education completed, patient discharged from [facility] Discharge plan remains appropriate    Co-evaluation                 End of Session     Activity Tolerance Patient tolerated treatment well   Patient Left in bed;with call bell/phone within reach   Nurse Communication          Time: 7353-2992 OT Time Calculation (min): 24 min  Charges: OT General Charges $OT Visit: 1 Procedure OT Treatments $Self Care/Home Management : 23-37 mins  Janice Coffin, COTA/L 05/04/2014, 9:30 AM

## 2014-05-04 NOTE — Progress Notes (Signed)
STROKE TEAM PROGRESS NOTE   HISTORY Jeanne Lawson is a 69 y.o. 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 Her past medical history is also significant for hypertension, paroxysmal atrial fibrillation, previous TIAs, known cerebral aneurysms by MRA in 2011 , ongoing tobacco use, mild coronary artery disease, and a respiratory infection with productive cough but no fever present for approximately 2 weeks and currently being treated with amoxicillin, Robitussin-DM, and an inhaler. The patient spoke with one of her daughters by telephone last night (05/01/2014, time unknown) and appeared to be in her normal state of health. This morning 05/02/2014  one of her daughters called her just after 11 AM and noted that her mother had very slow and dysarthric speech. She sounded so abnormal that at first the daughter thought she had the wrong number. The patient stated that suddenly a bad feeling came over her and she hung up the phone and called 911. Her daughters thought that initially she may have been confused but now they are thinking it may have just been her speech difficulties. They also noted a facial droop which has since resolved. She was brought in as a code stroke. The patient denied any focal weakness. Her initial CT of the head was unremarkable. An MRI/MRA is pending. She has taken aspirin 81 mg daily without fail since her stroke 6 years ago. tPA was not Given due to late presentation and minimal deficits.   SUBJECTIVE (INTERVAL HISTORY) Patient is up on the side of the bed. Her daughter and other family members are at the bedside as well. Patient states she has atrial fibrillation and has had 19 years. She has never been on anticoagulation, nor she or her family remember ever having discussed anticoagulation. However, only checked the medical record in Epic, it was documented that,  as per her cardiologist, she did not have A. fib, and it was PACs. And on her recent visit to her neurologist, A. fib was not mentioned anywhere. She stated that she had twice 30 day cardiac monitoring and was not told to have A. fib during monitoring.   OBJECTIVE Temp:  [97.9 F (36.6 C)-98.7 F (37.1 C)] 98.3 F (36.8 C) (04/11 1008) Pulse Rate:  [60-79] 79 (04/11 1008) Cardiac Rhythm:  [-] Normal sinus rhythm (04/11 0400) Resp:  [18-24] 20 (04/11 1008) BP: (104-167)/(62-81) 104/81 mmHg (04/11 1008) SpO2:  [95 %-99 %] 98 % (04/11 1008)   Recent Labs Lab 05/02/14 1259  GLUCAP 101*    Recent Labs Lab 05/02/14 1313 05/02/14 1324 05/03/14 1910 05/04/14 0703  NA 140 141 138 139  K 3.9 4.0 3.8 4.2  CL 104 104 101 105  CO2 23  --  31 28  GLUCOSE 103* 104* 123* 104*  BUN 8 8 9 11   CREATININE 0.71 0.70 0.79 0.72  CALCIUM 9.0  --  9.3 8.8    Recent Labs Lab 05/03/14 1910  AST 29  ALT 20  ALKPHOS 42  BILITOT 1.1  PROT 6.9  ALBUMIN 3.6    Recent Labs Lab 05/02/14 1313 05/02/14 1324 05/04/14 0703  WBC 6.1  --  5.4  NEUTROABS 4.2  4.8  --   --   HGB 13.8 15.0 13.7  HCT 40.0 44.0 40.6  MCV 83.3  --  85.7  PLT 145*  --  128*   No results for  input(s): CKTOTAL, CKMB, CKMBINDEX, TROPONINI in the last 168 hours.  Recent Labs  05/02/14 1313  LABPROT 13.5  INR 1.02    Recent Labs  05/02/14 1500  COLORURINE YELLOW  LABSPEC 1.007  PHURINE 7.0  GLUCOSEU NEGATIVE  HGBUR NEGATIVE  BILIRUBINUR NEGATIVE  KETONESUR NEGATIVE  PROTEINUR NEGATIVE  UROBILINOGEN 0.2  NITRITE NEGATIVE  LEUKOCYTESUR NEGATIVE       Component Value Date/Time   CHOL 165 05/03/2014 0634   TRIG 231* 05/03/2014 0634   HDL 44 05/03/2014 0634   CHOLHDL 3.8 05/03/2014 0634   VLDL 46* 05/03/2014 0634   LDLCALC 75 05/03/2014 0634   Lab Results  Component Value Date   HGBA1C 5.6 05/03/2014      Component Value Date/Time   LABOPIA NONE DETECTED 05/02/2014 1500   COCAINSCRNUR NONE  DETECTED 05/02/2014 1500   LABBENZ NONE DETECTED 05/02/2014 1500   AMPHETMU NONE DETECTED 05/02/2014 1500   THCU NONE DETECTED 05/02/2014 1500   LABBARB NONE DETECTED 05/02/2014 1500     Recent Labs Lab 05/02/14 1313  ETH <5   I have personally reviewed the radiological images below and agree with the radiology interpretations.  Dg Chest 2 View 05/02/2014    1. Stable cardiomegaly without failure.  2. Emphysema.      Ct Head Wo Contrast 05/02/2014    1. Similar findings of atrophy and extensive microvascular ischemic disease without definite superimposed acute intracranial process. 2. Extensive sinus disease, including air-fluid levels within the bilateral maxillary sinuses.  MR HEAD   05/02/2014  Exam is motion degraded. Sagittal T1 weighted imaging not performed.  Acute nonhemorrhagic small infarct at the junction of the posterior superior aspect of the left lenticular nucleus and the corona radiata.  Remote bilateral centrum semiovale/ corona radiata infarcts. Remote bilateral thalamic and basal ganglia small infarcts. Prominent small vessel disease type changes.  No intracranial hemorrhage.    MR CIRCLE OF WILLIS   05/02/2014  Exam is motion degraded.  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   Moderate narrowing of portions of the A1 segment of the right anterior cerebral artery.  Middle cerebral artery moderate branch vessel narrowing and irregularity.  A2 segment anterior cerebral artery moderate narrowing and irregularity.  Right vertebral artery ends in a posterior inferior cerebellar artery distribution. Mild to moderate narrowing of portions  of the distal right vertebral artery.  Slight irregularity with mild narrowing distal left vertebral artery and basilar artery.   Nonvisualized right anterior inferior cerebellar artery.   Fetal type contribution to the right posterior cerebral artery.  Moderate narrowing P1 -2 junction left posterior cerebral artery.    MRA NECK   05/02/2014  Evaluation of aortic arch and great vessels limited by motion.  Ectatic common carotid arteries.  No significant stenosis of either carotid bifurcation.  Ectatic vertical cervical segment of the internal carotid artery bilaterally.  Limited evaluation of the origin of the vertebral arteries. Left vertebral artery is dominant. Areas of narrowing, regularity ectasia of vertebral arteries bilaterally.     2D Echocardiogram   - Left ventricle: The cavity size was normal. Wall  thickness wasnormal. Systolic function was normal. The estimated ejectionfraction was in the range of 55% to 60%. Doppler parameters areconsistent with abnormal left ventricular relaxation (grade 1diastolic dysfunction). - Mitral valve: Calcified annulus.   PHYSICAL EXAM Mental Status: Alert, oriented, thought content appropriate. Speech somewhat slowed and mildly slurred. Able to follow 3 step commands without difficulty. Cranial Nerves: II: Discs not visualized; Visual fields grossly normal, pupils equal, round, reactive to light and accommodation III,IV, VI: ptosis not present, extra-ocular motions intact bilaterally V,VII: smile symmetric, facial light touch sensation normal bilaterally VIII: hearing normal bilaterally IX,X: gag reflex present XI: bilateral shoulder shrug XII: midline tongue extension Motor: Right :Upper extremity 5/5    Left: Upper extremity 4/5 Lower extremity5/5 Lower extremity 5/5 Tone and bulk:normal tone throughout; no atrophy noted Sensory: Light touch intact throughout, bilaterally Deep Tendon Reflexes: 2+  and symmetric throughout Plantars: Right: downgoingLeft: downgoing Cerebellar: normal finger-to-nose with right upper extremity. Dysmetria with left upper extremity. Normal heel-to-shin test Gait: Deferred   ASSESSMENT/PLAN Jeanne Lawson is a 69 y.o. female with history of previous stroke/TIAs, known cerebral aneurysms, mild coronary artery disease, recent respiratory infection presenting with slow dysarthric speech. She did not receive IV t-PA due to late presentation and minimal deficits.   Stroke:  left lenticular nucleus / corona radiata infarct secondary to small vessel disease.  Resultant  speech deficits  MRI - small acute infarct at the junction left lenticular nucleus and the corona radiata as well as multiple remote infarcts.  MRA - enlargement of a previously known cerebral aneurysms.  2D Echo no source of embolus  HgbA1c 5.6  Lovenox for VTE prophylaxis Diet Heart Room service appropriate?: Yes; Fluid consistency:: Thin  aspirin 81 mg orally every day and clopidogrel 75 mg orally every day prior to admission, now on aspirin 81 mg orally every day and clopidogrel 75 mg orally every day  Patient counseled to be compliant with her antithrombotic medications  Ongoing aggressive stroke risk factor management  Therapy recommendations:  HH PT, HH OT  Disposition: home with therapies   NO HX Atrial Fibrillation  Patient reports she has a hx of atrial fibrillation. She thinks it was diagnosed 19 years ago  Per Stroke Team notes in 2010 (she had a stroke at that time due to small vessel disease, Dr. [name] and Ivin Booty saw, see their note), discussion occurred with Dr. Mare Ferrari who stated she did NOT have history of PAF, but did have a history of PACs. She has followed up with Dr. Mare Ferrari routinely since that time with no mention of new atrial fibrillation.   She stated that she had twice cardiac monitoring 30 day and was not told to have A. fib  EKGs neg for  atrial fibrillation  atrial fibrillation was removed from pt history  Hypertension  Home meds: Lisinopril and Toprol-XL  Stable  Hyperlipidemia  Home meds:  No lipid lowering medications prior to admission.   LDL 75, goal < 70  Added Lipitor 40 mg daily  Continue statin at discharge  Other Stroke Risk Factors  Advanced age  Cigarette smoker, advised to stop smoking  Hx stroke/TIA  Family hx strokes / TIAs (mother and grandparents)  Coronary artery disease  Cerebral aneurysms  Left carotid Aneurysm, increased in size now measuring 8 x 7 x 6 mm versus prior 6 x 5 x 5 mm  right posterior communicating artery. increased in size now measuring 9 x 7 x 6 mm versus prior 6 x 5 x 6  mm    right internal carotid artery fusiform ectasia similar to prior exam.    Dr. [name] consulted. Feel with increasing size, high risk for rupture. Plans endovascular treatment 7-10 days post ischemic stroke.  Other Active Problems  The patient's daughter feels her mother needs something for anxiety and depression. Will defer to the hospitalist.  Hospital day # [facility] for Pager information 05/04/2014 3:36 PM   I, the attending vascular neurologist, have personally obtained a history, examined the patient, evaluated laboratory data, individually viewed imaging studies and agree with radiology interpretations. I also obtained additional history from pt's daughter and granddaughter at bedside. I also discussed with Dr. Sheran Fava regarding her care plan. Together with the NP/PA, we formulated the assessment and plan of care which reflects our mutual decision.  I have made any additions or clarifications directly to the above note and agree with the findings and plan as currently documented.   69 year old female with history of stroke in 2010 (right corona radiata stroke with left arm and leg weakness), smoker, hypertension was admitted for left basal  ganglia and corona radiata strokes. There is a questionable history of A. fib, however by detailed history taking and documentation searching, it seems that she did not have A. fib in the past and it was PACs. A. fib diagnosis was removed from her history. Stroke workup unremarkable, however found to have enlarged left ICA terminus aneurysm and right PCOM aneurysms, IR consulted, we will consider coiling in 7-10 days. Patient was continued on dural antiplatelet, and highly encouraged to quit smoking. She will follow-up in clinic.  Neurology will sign off. Please call with questions. Pt will follow up with Dr. Erlinda Hong at [facility] in about 2 months. Thanks for the consult.  Rosalin Hawking, MD PhD Stroke Neurology 05/05/2014 1:11 PM      To contact Stroke Continuity provider, please refer to [email or web address] After hours, contact General Neurology

## 2014-05-04 NOTE — Progress Notes (Signed)
Talked to with family members present about [name] choices, patient chose Jeanne Lawson for [facility] services; Mary with [facility] called for arrangements; Mindi Slicker RN,BSN,MHA [phone]

## 2014-05-04 NOTE — Progress Notes (Signed)
  Echocardiogram 2D Echocardiogram has been performed.  Jeanne Lawson 05/04/2014, 10:10 AM

## 2014-05-04 NOTE — Discharge Summary (Addendum)
Physician Discharge Summary  Jeanne Lawson:[id number] DOB: Oct 06, 1945 DOA: 05/02/2014  PCP: [name], MD  Admit date: 05/02/2014 Discharge date: 05/04/2014  Recommendations for Outpatient Follow-up:  1. Follow up with Dr. [name] in 10 days.  Please check BMP to follow up creatinine and potassium 2. Dr. Mare Ferrari in 2 months for follow up of PAC and CAD.   3. PCP in 1 month:  Please check blood pressure and routine health maintenance.  Referral for PFTs and management of COPD.  Please discuss symptoms of anxiety/depression at next visit.   4. Home health PT/OT   Discharge Diagnoses:  Principal Problem:   CVA (cerebral infarction) Active Problems:   Hypertension   Tobacco abuse   Chronic ischemic heart disease   Aneurysm   Expressive aphasia   Saccular aneurysm   Aphasia   Palmar erythema   Discharge Condition: stable, improved  Diet recommendation: healthy heart  Wt Readings from Last 3 Encounters:  05/02/14 63.7 kg (140 lb 6.9 oz)  04/22/14 61.236 kg (135 lb)  11/18/13 64.411 kg (142 lb)    History of present illness:  Jeanne Lawson is a 69 y.o. female with a history of a CVA with residual Left Sided Weakness, Saccular Cerebral Aneurysm, TIAs, CAD, Paroxsmal Atrial Fibrillation, HTN, who presents to the ED with complaints of leaning to the left upon awakening in the AM at 7 AM, and later in the AM was talking to her daughter and she was noticed to have slurring of her speech. Her symptoms began to improve but she still had slow speech and some confusion. She was brought to the ED, and evaluated and a CT scan and MRI were performed which revealed a Small Infarct at the junction of the posterior superior aspect of the Lenticular Nucleus of the corona radiata as well as enlargement of her previous aneurysms.  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 Dora Sims recommends follow up for endovascular repair in 10 days  PAC:  Patient states that she was started on digoxin over 30 years ago for possible atrial fibrillation and the medication has been continued all these years.  She was never started on anticoagulation despite a chads 2 score of 4.  Since the time that Dr. Mare Ferrari, cardiology, has been caring for her, she has had no atrial fibrillation however she has had frequent PACs. I discussed the case with Dr. Mare Ferrari who agreed with a trial of stopping digoxin. She has had normal sinus rhythm on telemetry with some PACs but no atrial fibrillation since admission. - Tele: NSR - discontinue digoxin   HTN, blood pressure elevated - Continue metoprolol and lisinopril - Add HCTZ  HLD, not at goal, start high dose statin  Wheezing without acute exacerbation,  likely due to COPD given her history of smoking. Emphysema on CXR - Advised to quit smoking - Start duonebs, dulera  Palmar erythema, may be due to side effect of medication but usually caused by cirrhosis. Has some telangiectasias as well. Denies alcohol use and alcohol level was normal at admission - RUQ: Fatty liver - LFTs wnl - Alcohol level was negative - Hepatitis B&C panel, and ammonia level  pending  Tobacco abuse - Counseled cessation - Continue nicotine patch  Grief reaction, tearful. States prior to this hospitalization she did not feel depressed  Consultants:  Neuro interventionalist, Dr. [name]  Neurology, Dr. Jannifer Franklin  Procedures:  CT head  MRA brain  MRA neck  MRI brain  ECHO  Antibiotics:  none  Discharge Exam: Filed Vitals:   05/04/14 1403  BP: 133/61  Pulse: 63  Temp: 97.4 F (36.3 C)  Resp: 21   Filed Vitals:   05/04/14 0157 05/04/14 0706 05/04/14 1008 05/04/14 1403  BP: 140/62 145/66 104/81 133/61  Pulse: 70 69 79 63  Temp: 98.1 F (36.7 C) 98.7 F (37.1 C) 98.3 F (36.8 C) 97.4 F (36.3 C)  TempSrc: Oral Oral Oral Oral  Resp: 24 22 20 21   Height:      Weight:      SpO2: 95% 95% 98% 97%     General: Average weight female, No acute distress  HEENT: NCAT, MMM  Cardiovascular: RRR, nl S1, S2 no mrg, 2+ pulses, warm extremities  Respiratory: CTAB, no increased WOB  Abdomen: NABS, soft, NT/ND  MSK: Normal tone and bulk, no LEE  Neuro: CN II-XII grossly intact, strength 5-/5 LUE and LLE, mild aphasia  Discharge Instructions      Discharge Instructions    Call MD for:  difficulty breathing, headache or visual disturbances    Complete by:  As directed      Call MD for:  extreme fatigue    Complete by:  As directed      Call MD for:  hives    Complete by:  As directed      Call MD for:  persistant dizziness or light-headedness    Complete by:  As directed      Call MD for:  persistant nausea and vomiting    Complete by:  As directed      Call MD for:  severe uncontrolled pain    Complete by:  As directed      Call MD for:  temperature >100.4    Complete by:  As directed      Diet - low sodium heart healthy    Complete by:  As directed      Discharge instructions    Complete by:  As directed   You were hospitalized with a stroke and we found that you had some enlarging brain aneurysms.  Please  continue to take aspirin and plavix.  I have added lipitor (atorvastatin) which helps dissolve the plaques that can cause strokes AND it also treats high cholesterol, which you have.  Please follow up with Dr. Dora Sims next week for treatment of your aneurysms.  You had some wheezing that is from COPD (chronic obstructive pulmonary disease).  I have started you on a maintenance or controller medication called dulera which helps PREVENT wheezing.  Please use this medication EVERY DAY twice a day to prevent wheezing.  You should continue to use your albuterol inhaler as needed if you have shortness of breath or wheezing.  You need to [facility]  SMOKING.  Smoking is causing you to have strokes and emphysema/COPD.  Use nicotine patches, first 14mg  patches for a week, then 7mg  patches for a week, then stop.  Talk to your primary care doctor about quitting.  For your heart, please STOP your lanoxin and your lisinopril.  Because your blood pressure was high, I have given you a prescription for the combination pill that includes lisinopril PLUS hydrochlorothiazide.  You will need blood work repeated in about a week which will be done with your surgery next week.  If you have numbness, tingling, weakness, slurred speech, changes to how your face looks (facial droop), confusion, severe headache, or fainting, call 911 immediately.     Increase activity slowly    Complete by:  As directed             Medication List    STOP taking these medications        LANOXIN 0.25 MG tablet  Generic drug:  digoxin     lisinopril 10 MG tablet  Commonly known as:  PRINIVIL,ZESTRIL     lisinopril 20 MG tablet  Commonly known as:  PRINIVIL,ZESTRIL      TAKE these medications        albuterol 108 (90 BASE) MCG/ACT inhaler  Commonly known as:  PROVENTIL HFA;VENTOLIN HFA  Inhale 1-2 puffs into the lungs every 6 (six) hours as needed for wheezing or shortness of breath.     aspirin 81 MG tablet  Take 81 mg by mouth every  other day. Taking every other day     atorvastatin 40 MG tablet  Commonly known as:  LIPITOR  Take 1 tablet (40 mg total) by mouth daily at 6 PM.     clopidogrel 75 MG tablet  Commonly known as:  PLAVIX  Take 1 tablet (75 mg total) by mouth daily.     lisinopril-hydrochlorothiazide 20-12.5 MG per tablet  Commonly known as:  PRINZIDE,ZESTORETIC  Take 1 tablet by mouth daily.     mometasone-formoterol 100-5 MCG/ACT Aero  Commonly known as:  DULERA  Inhale 2 puffs into the lungs 2 (two) times daily.     nicotine 14 mg/24hr patch  Commonly known as:  NICODERM CQ - dosed in mg/24 hours  Place 1 patch (14 mg total) onto the skin daily.     nicotine 7 mg/24hr patch  Commonly known as:  NICODERM CQ - dosed in mg/24 hr  Place 1 patch (7 mg total) onto the skin daily.     TOPROL XL 50 MG 24 hr tablet  Generic drug:  metoprolol succinate  TAKE 1/2 TABLET BY MOUTH TWICE DAILY.       Follow-up Information    Follow up with [facility].   Why:  they will do your home health care at your home   Contact information:   [place] [address] [phone]       Follow up with DEVESHWAR, Fritz Pickerel, MD. Schedule an appointment as soon as possible for a visit in 1 week.   Specialty:  Interventional Radiology   Contact information:   [address] [place] [address] [phone]       Follow up with [name], MD. Schedule an appointment as soon as possible for a visit in 1 month.   Specialty:  Internal Medicine   Contact information:   [address] [place] [address] [phone]       Follow up with Darlin Coco,  MD. Schedule an appointment as soon as possible for a visit in 2 months.   Specialty:  Cardiology   Contact information:   [place] [address] [place] [address] [phone]        The results of significant diagnostics from this hospitalization (including imaging, microbiology,  ancillary and laboratory) are listed below for reference.    Significant Diagnostic Studies: Dg Chest 2 View  05/02/2014   CLINICAL DATA:  Productive cough. Shortness of breath. History smoking 2-3 cigarettes per day.  EXAM: CHEST - 2 VIEW  COMPARISON:  Two-view chest x-ray 04/22/2014.  FINDINGS: Emphysematous changes are again noted. The heart is enlarged. There is no edema or effusion to suggest failure. No focal airspace disease is evident. The visualized soft tissues and bony thorax are unremarkable.  IMPRESSION: 1. Stable cardiomegaly without failure. 2. Emphysema.   Electronically Signed   By: San Morelle M.D.   On: 05/02/2014 19:34   Dg Chest 2 View  04/22/2014   CLINICAL DATA:  Cough, congestion and shortness of breath worsening over 2 days. Occasional smoker.  EXAM: CHEST  2 VIEW  COMPARISON:  None.  FINDINGS: Cardiac silhouette is mildly enlarged, mediastinal silhouette is nonsuspicious torturous aorta. Mild chronic interstitial changes and increased lung volumes can be seen with COPD without pleural effusion or focal consolidation. Mild biapical pleural thickening. No pneumothorax. Soft tissue planes and included osseous structures nonsuspicious.  IMPRESSION: Mild cardiomegaly, no acute pulmonary process.   Electronically Signed   By: Elon Alas   On: 04/22/2014 06:52   Ct Head Wo Contrast  05/02/2014   CLINICAL DATA:  Code stroke. Slurred speech. Leaning towards the left side. History of TIA, CVA, hypertension and CAD.  EXAM: CT HEAD WITHOUT CONTRAST  TECHNIQUE: Contiguous axial images were obtained from the base of the skull through the vertex without intravenous contrast.  COMPARISON:  04/25/2009; 04/24/2009; brain MRI - 04/24/2009  FINDINGS: Similar findings of advanced atrophy with sulcal prominence and prominence of the extra-axial spaces about the bilateral parietal cortices. Extensive periventricular hypodensities compatible with microvascular ischemic disease, left greater  than right. There is unchanged apparent focal ectasia involving the central aspect of the left MCA (image 12, series 201). Given extensive background parenchymal abnormalities, there is no CT evidence of superimposed acute large territory infarct. No intraparenchymal or extra-axial mass or hemorrhage. Unchanged size and configuration of the ventricles and basilar cisterns. No midline shift.  Nearly confluent opacification of the bilateral anterior and posterior ethmoidal air cells. Air-fluid levels and mucosal thickening are seen with the bilateral maxillary sinuses. The sphenoid and frontal sinuses appear normally aerated. Mastoid air cells are normally aerated. Regional soft tissues appear normal. No displaced calvarial fracture.  IMPRESSION: 1. Similar findings of atrophy and extensive microvascular ischemic disease without definite superimposed acute intracranial process. 2. Extensive sinus disease, including air-fluid levels within the bilateral maxillary sinuses, as above. Critical Value/emergent results were called by telephone at the time of interpretation on 05/02/2014 at 1:17 pm to Dr. Leonel Ramsay, who verbally acknowledged these results.   Electronically Signed   By: Sandi Mariscal M.D.   On: 05/02/2014 13:23   Mr Virgel Paling Wo Contrast  05/02/2014   CLINICAL DATA:  69 year old hypertensive female with history of atrial fibrillation and known cerebral aneurysms presenting with abnormal speech and transient facial droop. Initial encounter.  EXAM: MR HEAD WITHOUT CONTRAST  MR CIRCLE OF WILLIS WITHOUT CONTRAST  MRA OF THE NECK WITHOUT AND WITH CONTRAST  TECHNIQUE: Multiplanar,  multiecho pulse sequences of the brain and surrounding structures were obtained according to standard protocol without intravenous contrast.; Angiographic images of the Circle of Willis were obtained using MRA technique without intravenous contrast.; Multiplanar and multiecho pulse sequences of the neck were obtained without and with  intravenous contrast. Angiographic images of the neck were obtained using MRA technique without and with intravenous contast.  CONTRAST:  64mL MULTIHANCE GADOBENATE DIMEGLUMINE 529 MG/ML IV SOLN  COMPARISON:  05/02/2014 head CT. 04/24/2009 brain MR and MR angiogram.  FINDINGS: MR HEAD FINDINGS  Exam is motion degraded. Sagittal T1 weighted imaging not performed.  Acute nonhemorrhagic small infarct at the junction of the posterior superior aspect of the left lenticular nucleus and the corona radiata.  Remote bilateral centrum semiovale/ corona radiata infarcts. Remote bilateral thalamic and basal ganglia small infarcts. Prominent small vessel disease type changes.  No intracranial hemorrhage.  Global atrophy without hydrocephalus.  No intracranial mass lesion noted on this unenhanced exam.  Aneurysms as discussed below.  MR CIRCLE OF WILLIS FINDINGS  Exam is motion degraded.  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  Moderate narrowing of portions of the A1 segment of the right anterior cerebral artery.  Middle cerebral artery moderate branch vessel narrowing and irregularity.  A2 segment anterior cerebral artery moderate narrowing and irregularity.  Right vertebral artery ends in a posterior inferior cerebellar artery distribution. Mild to moderate narrowing of portions of the distal right vertebral artery.  Slight irregularity with mild narrowing distal left vertebral artery and basilar artery.  Nonvisualized left posterior inferior cerebellar artery.  Nonvisualized right anterior inferior cerebellar artery.  Fetal type contribution to the right posterior cerebral  artery.  Moderate narrowing P1 -2 junction left posterior cerebral artery.  MRA NECK FINDINGS  Evaluation of aortic arch and great vessels limited by motion. Two vessel aortic arch.  Ectatic common carotid arteries.  No significant stenosis of either carotid bifurcation.  Ectatic vertical cervical segment of the internal carotid artery bilaterally.  Limited evaluation of the origin of the vertebral arteries. Left vertebral artery is dominant. Areas of narrowing, regularity ectasia of vertebral arteries bilaterally.  IMPRESSION: MR HEAD  Exam is motion degraded. Sagittal T1 weighted imaging not performed.  Acute nonhemorrhagic small infarct at the junction of the posterior superior aspect of the left lenticular nucleus and the corona radiata.  Remote bilateral centrum semiovale/ corona radiata infarcts. Remote bilateral thalamic and basal ganglia small infarcts. Prominent small vessel disease type changes.  No intracranial hemorrhage.  MR CIRCLE OF WILLIS  Exam is motion degraded.  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  Moderate narrowing of portions of the A1 segment of the right anterior cerebral artery.  Middle cerebral artery moderate branch  vessel narrowing and irregularity.  A2 segment anterior cerebral artery moderate narrowing and irregularity.  Right vertebral artery ends in a posterior inferior cerebellar artery distribution. Mild to moderate narrowing of portions of the distal right vertebral artery.  Slight irregularity with mild narrowing distal left vertebral artery and basilar artery.  Nonvisualized left posterior inferior cerebellar artery.   Nonvisualized right anterior inferior cerebellar artery.  Fetal type contribution to the right posterior cerebral artery.  Moderate narrowing P1 -2 junction left posterior cerebral artery.  MRA NECK  Evaluation of aortic arch and great vessels limited by motion.  Ectatic common carotid arteries.  No significant stenosis of either carotid bifurcation.  Ectatic vertical cervical segment of the internal carotid artery bilaterally.  Limited evaluation of the origin of the vertebral arteries. Left vertebral artery is dominant. Areas of narrowing, regularity ectasia of vertebral arteries bilaterally.   Electronically Signed   By: Genia Del M.D.   On: 05/02/2014 18:20   Mr Angiogram Neck W Wo Contrast  05/02/2014   CLINICAL DATA:  69 year old hypertensive female with history of atrial fibrillation and known cerebral aneurysms presenting with abnormal speech and transient facial droop. Initial encounter.  EXAM: MR HEAD WITHOUT CONTRAST  MR CIRCLE OF WILLIS WITHOUT CONTRAST  MRA OF THE NECK WITHOUT AND WITH CONTRAST  TECHNIQUE: Multiplanar, multiecho pulse sequences of the brain and surrounding structures were obtained according to standard protocol without intravenous contrast.; Angiographic images of the Circle of Willis were obtained using MRA technique without intravenous contrast.; Multiplanar and multiecho pulse sequences of the neck were obtained without and with intravenous contrast. Angiographic images of the neck were obtained using MRA technique without and with intravenous contast.  CONTRAST:  80mL MULTIHANCE GADOBENATE DIMEGLUMINE 529 MG/ML IV SOLN  COMPARISON:  05/02/2014 head CT. 04/24/2009 brain MR and MR angiogram.  FINDINGS: MR HEAD FINDINGS  Exam is motion degraded. Sagittal T1 weighted imaging not performed.  Acute nonhemorrhagic small infarct at the junction of the posterior superior aspect of the left lenticular nucleus and the corona radiata.  Remote bilateral centrum semiovale/ corona radiata  infarcts. Remote bilateral thalamic and basal ganglia small infarcts. Prominent small vessel disease type changes.  No intracranial hemorrhage.  Global atrophy without hydrocephalus.  No intracranial mass lesion noted on this unenhanced exam.  Aneurysms as discussed below.  MR CIRCLE OF WILLIS FINDINGS  Exam is motion degraded.  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  Moderate narrowing of portions of the A1 segment of the right anterior cerebral artery.  Middle cerebral artery moderate branch vessel narrowing and irregularity.  A2 segment anterior cerebral artery moderate narrowing and irregularity.  Right vertebral artery ends in a posterior inferior cerebellar artery distribution. Mild to moderate narrowing of portions of the distal right vertebral artery.  Slight irregularity with mild narrowing distal left vertebral artery and basilar artery.  Nonvisualized left posterior inferior cerebellar artery.  Nonvisualized right anterior inferior cerebellar artery.  Fetal type contribution to the right posterior cerebral artery.  Moderate narrowing P1 -2 junction left posterior cerebral artery.  MRA NECK FINDINGS  Evaluation of aortic arch and great  vessels limited by motion. Two vessel aortic arch.  Ectatic common carotid arteries.  No significant stenosis of either carotid bifurcation.  Ectatic vertical cervical segment of the internal carotid artery bilaterally.  Limited evaluation of the origin of the vertebral arteries. Left vertebral artery is dominant. Areas of narrowing, regularity ectasia of vertebral arteries bilaterally.  IMPRESSION: MR HEAD  Exam is motion  degraded. Sagittal T1 weighted imaging not performed.  Acute nonhemorrhagic small infarct at the junction of the posterior superior aspect of the left lenticular nucleus and the corona radiata.  Remote bilateral centrum semiovale/ corona radiata infarcts. Remote bilateral thalamic and basal ganglia small infarcts. Prominent small vessel disease type changes.  No intracranial hemorrhage.  MR CIRCLE OF WILLIS  Exam is motion degraded.  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  Moderate narrowing of portions of the A1 segment of the right anterior cerebral artery.  Middle cerebral artery moderate branch vessel narrowing and irregularity.  A2 segment anterior cerebral artery moderate narrowing and irregularity.  Right vertebral artery ends in a posterior inferior cerebellar artery distribution. Mild to moderate narrowing of portions of the distal right vertebral artery.  Slight irregularity with mild narrowing distal left vertebral artery and basilar artery.  Nonvisualized left posterior inferior cerebellar artery.  Nonvisualized right anterior inferior cerebellar artery.  Fetal type contribution to the right posterior cerebral artery.  Moderate narrowing P1 -2 junction left posterior cerebral artery.  MRA NECK  Evaluation of aortic arch and great vessels limited by motion.  Ectatic common carotid arteries.  No significant stenosis of either carotid bifurcation.  Ectatic vertical cervical segment of the internal carotid artery bilaterally.  Limited evaluation of the origin of the vertebral arteries. Left vertebral artery is dominant. Areas of narrowing, regularity ectasia of  vertebral arteries bilaterally.   Electronically Signed   By: Genia Del M.D.   On: 05/02/2014 18:20   Mr Brain Wo Contrast  05/02/2014   CLINICAL DATA:  69 year old hypertensive female with history of atrial fibrillation and known cerebral aneurysms presenting with abnormal speech and transient facial droop. Initial encounter.  EXAM: MR HEAD WITHOUT CONTRAST  MR CIRCLE OF WILLIS WITHOUT CONTRAST  MRA OF THE NECK WITHOUT AND WITH CONTRAST  TECHNIQUE: Multiplanar, multiecho pulse sequences of the brain and surrounding structures were obtained according to standard protocol without intravenous contrast.; Angiographic images of the Circle of Willis were obtained using MRA technique without intravenous contrast.; Multiplanar and multiecho pulse sequences of the neck were obtained without and with intravenous contrast. Angiographic images of the neck were obtained using MRA technique without and with intravenous contast.  CONTRAST:  8mL MULTIHANCE GADOBENATE DIMEGLUMINE 529 MG/ML IV SOLN  COMPARISON:  05/02/2014 head CT. 04/24/2009 brain MR and MR angiogram.  FINDINGS: MR HEAD FINDINGS  Exam is motion degraded. Sagittal T1 weighted imaging not performed.  Acute nonhemorrhagic small infarct at the junction of the posterior superior aspect of the left lenticular nucleus and the corona radiata.  Remote bilateral centrum semiovale/ corona radiata infarcts. Remote bilateral thalamic and basal ganglia small infarcts. Prominent small vessel disease type changes.  No intracranial hemorrhage.  Global atrophy without hydrocephalus.  No intracranial mass lesion noted on this unenhanced exam.  Aneurysms as discussed below.  MR CIRCLE OF WILLIS FINDINGS  Exam is motion degraded.  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  Moderate narrowing of portions of the A1 segment of the right anterior cerebral artery.  Middle cerebral artery moderate branch vessel narrowing and irregularity.  A2 segment anterior cerebral artery moderate narrowing and irregularity.  Right vertebral artery ends in a posterior inferior cerebellar artery distribution. Mild to moderate narrowing of portions of the distal right vertebral artery.  Slight irregularity with mild narrowing distal left vertebral artery and basilar artery.  Nonvisualized left posterior inferior cerebellar artery.  Nonvisualized right anterior inferior cerebellar artery.  Fetal type contribution to the right posterior cerebral artery.  Moderate narrowing P1 -2 junction left posterior cerebral artery.  MRA NECK FINDINGS  Evaluation of aortic arch and great vessels limited by motion. Two vessel aortic arch.  Ectatic common carotid arteries.  No significant stenosis of either carotid bifurcation.  Ectatic vertical cervical segment of the internal carotid artery bilaterally.  Limited evaluation of the origin of the vertebral arteries. Left vertebral artery is dominant. Areas of narrowing, regularity ectasia of vertebral arteries bilaterally.  IMPRESSION: MR HEAD  Exam is motion degraded. Sagittal T1 weighted imaging not performed.  Acute nonhemorrhagic small infarct at the junction of the posterior superior aspect of the left lenticular nucleus and the corona radiata.  Remote bilateral centrum semiovale/ corona radiata infarcts. Remote bilateral thalamic and basal ganglia small infarcts. Prominent small vessel disease type changes.  No intracranial hemorrhage.  MR CIRCLE OF WILLIS  Exam is motion degraded.  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  Moderate narrowing of portions of the A1 segment of the right anterior cerebral artery.  Middle cerebral artery moderate branch vessel narrowing and irregularity.  A2 segment anterior cerebral artery moderate narrowing and irregularity.  Right vertebral artery ends in a posterior inferior cerebellar artery distribution. Mild to moderate narrowing of portions of the distal right vertebral artery.  Slight irregularity with mild narrowing distal left vertebral artery and basilar artery.  Nonvisualized left posterior inferior cerebellar artery.  Nonvisualized right anterior inferior cerebellar artery.  Fetal type contribution to the right posterior cerebral artery.  Moderate narrowing P1 -2 junction left posterior cerebral artery.  MRA NECK  Evaluation of aortic arch and great vessels limited by motion.  Ectatic common carotid arteries.  No significant stenosis of either carotid bifurcation.  Ectatic vertical cervical segment of the internal carotid artery bilaterally.  Limited evaluation of the origin of the vertebral arteries. Left vertebral artery is dominant. Areas of narrowing, regularity ectasia of vertebral arteries bilaterally.   Electronically Signed  By: Genia Del M.D.   On: 05/02/2014 18:20   US Abdomen Limited  05/04/2014   CLINICAL DATA:  Palmar erythema.  Question cirrhosis.  EXAM: US ABDOMEN LIMITED - RIGHT UPPER QUADRANT  COMPARISON:  None.  FINDINGS: Gallbladder:  No gallstones or wall thickening visualized. No sonographic Murphy sign noted.  Common bile duct:  Diameter: 0.7 cm  Liver:  Diffusely increased echogenicity is consistent with fatty infiltration. No focal lesion or evidence of cirrhosis is identified. There is no intrahepatic biliary ductal  dilatation.  IMPRESSION: Fatty infiltration of the liver. The examination is otherwise negative.   Electronically Signed   By: Inge Rise M.D.   On: 05/04/2014 07:40    Microbiology: No results found for this or any previous visit (from the past 240 hour(s)).   Labs: Basic Metabolic Panel:  Recent Labs Lab 05/02/14 1313 05/02/14 1324 05/03/14 1910 05/04/14 0703  NA 140 141 138 139  K 3.9 4.0 3.8 4.2  CL 104 104 101 105  CO2 23  --  31 28  GLUCOSE 103* 104* 123* 104*  BUN 8 8 9 11   CREATININE 0.71 0.70 0.79 0.72  CALCIUM 9.0  --  9.3 8.8   Liver Function Tests:  Recent Labs Lab 05/03/14 1910  AST 29  ALT 20  ALKPHOS 42  BILITOT 1.1  PROT 6.9  ALBUMIN 3.6   No results for input(s): LIPASE, AMYLASE in the last 168 hours. No results for input(s): AMMONIA in the last 168 hours. CBC:  Recent Labs Lab 05/02/14 1313 05/02/14 1324 05/04/14 0703  WBC 6.1  --  5.4  NEUTROABS 4.2  4.8  --   --   HGB 13.8 15.0 13.7  HCT 40.0 44.0 40.6  MCV 83.3  --  85.7  PLT 145*  --  128*   Cardiac Enzymes: No results for input(s): CKTOTAL, CKMB, CKMBINDEX, TROPONINI in the last 168 hours. BNP: BNP (last 3 results) No results for input(s): BNP in the last 8760 hours.  ProBNP (last 3 results) No results for input(s): PROBNP in the last 8760 hours.  CBG:  Recent Labs Lab 05/02/14 1259  GLUCAP 101*    Time coordinating discharge: 35 minutes  Signed:  Norman Piacentini  [facility] 05/04/2014, 4:44 PM

## 2014-05-05 ENCOUNTER — Other Ambulatory Visit (HOSPITAL_COMMUNITY): Payer: Self-pay | Admitting: Interventional Radiology

## 2014-05-05 DIAGNOSIS — I639 Cerebral infarction, unspecified: Secondary | ICD-10-CM

## 2014-05-05 DIAGNOSIS — Z01812 Encounter for preprocedural laboratory examination: Secondary | ICD-10-CM | POA: Diagnosis not present

## 2014-05-05 DIAGNOSIS — I671 Cerebral aneurysm, nonruptured: Secondary | ICD-10-CM

## 2014-05-05 LAB — HEPATITIS B SURFACE ANTIBODY,QUALITATIVE: HEP B S AB: NEGATIVE

## 2014-05-08 ENCOUNTER — Other Ambulatory Visit: Payer: Self-pay | Admitting: Radiology

## 2014-05-12 ENCOUNTER — Encounter (HOSPITAL_COMMUNITY): Payer: Self-pay

## 2014-05-12 ENCOUNTER — Encounter (HOSPITAL_COMMUNITY): Payer: Self-pay | Admitting: Certified Registered"

## 2014-05-12 ENCOUNTER — Encounter (HOSPITAL_COMMUNITY)
Admission: RE | Admit: 2014-05-12 | Discharge: 2014-05-12 | Disposition: A | Discharge status: Home / Self Care | Payer: Medicare Other | Source: Ambulatory Visit | Attending: Interventional Radiology | Admitting: Interventional Radiology

## 2014-05-12 ENCOUNTER — Other Ambulatory Visit: Payer: Self-pay | Admitting: Radiology

## 2014-05-12 DIAGNOSIS — Z01812 Encounter for preprocedural laboratory examination: Secondary | ICD-10-CM | POA: Diagnosis not present

## 2014-05-12 HISTORY — DX: Chronic obstructive pulmonary disease, unspecified: J44.9

## 2014-05-12 LAB — PROTIME-INR
INR: 1.02 (ref 0.00–1.49)
Prothrombin Time: 13.5 seconds (ref 11.6–15.2)

## 2014-05-12 LAB — CBC WITH DIFFERENTIAL/PLATELET
Basophils Absolute: 0 10*3/uL (ref 0.0–0.1)
Basophils Relative: 0 % (ref 0–1)
Eosinophils Absolute: 0 10*3/uL (ref 0.0–0.7)
Eosinophils Relative: 0 % (ref 0–5)
HEMATOCRIT: 41.2 % (ref 36.0–46.0)
HEMOGLOBIN: 14 g/dL (ref 12.0–15.0)
LYMPHS PCT: 35 % (ref 12–46)
Lymphs Abs: 2 10*3/uL (ref 0.7–4.0)
MCH: 28.9 pg (ref 26.0–34.0)
MCHC: 34 g/dL (ref 30.0–36.0)
MCV: 85.1 fL (ref 78.0–100.0)
MONO ABS: 0.5 10*3/uL (ref 0.1–1.0)
MONOS PCT: 9 % (ref 3–12)
NEUTROS ABS: 3.1 10*3/uL (ref 1.7–7.7)
Neutrophils Relative %: 56 % (ref 43–77)
Platelets: 162 10*3/uL (ref 150–400)
RBC: 4.84 MIL/uL (ref 3.87–5.11)
RDW: 13.9 % (ref 11.5–15.5)
WBC: 5.6 10*3/uL (ref 4.0–10.5)

## 2014-05-12 LAB — BASIC METABOLIC PANEL
Anion gap: 9 (ref 5–15)
BUN: 13 mg/dL (ref 6–23)
CALCIUM: 9.1 mg/dL (ref 8.4–10.5)
CO2: 29 mmol/L (ref 19–32)
Chloride: 100 mmol/L (ref 96–112)
Creatinine, Ser: 0.66 mg/dL (ref 0.50–1.10)
GFR, EST NON AFRICAN AMERICAN: 87 mL/min — AB (ref >90)
GLUCOSE: 93 mg/dL (ref 70–99)
Potassium: 3.6 mmol/L (ref 3.5–5.1)
Sodium: 138 mmol/L (ref 135–145)

## 2014-05-12 LAB — PLATELET INHIBITION P2Y12: PLATELET FUNCTION P2Y12: 111 [PRU] — AB (ref 194–418)

## 2014-05-12 NOTE — Pre-Procedure Instructions (Signed)
Jeanne Lawson  05/12/2014   Your procedure is scheduled on:  Wednesday, April 20th   Report to [facility] Admitting at 6:30 AM.   Call this number if you have problems the morning of surgery: [phone]   Remember:   Do not eat food or drink liquids after midnight tonight.   Take these medicines the morning of surgery with A SIP OF WATER: Aspirin, Plavix, Toprol.  Please use your inhaler that morning.   Do not wear jewelry, make-up or nail polish.  Do not wear lotions, powders, or perfumes. You may NOT wear deodorant the day of procedure.  Do not shave underarms & legs 48 hours prior to surgery.   Do not bring valuables to the hospital.  [facility] is not responsible for any belongings or valuables.               Contacts, dentures or bridgework may not be worn into surgery.  Leave suitcase in the car. After surgery it may be brought to your room.  For patients admitted to the hospital, discharge time is determined by your treatment team.    Name and phone number of your driver:    Special Instructions: Shower using CHG 2 nights before surgery and the night before surgery.  If you shower the day of surgery use CHG.  Use special wash - you have one bottle of CHG for all showers.  You should use approximately 1/3 of the bottle for each shower.   Please read over the following fact sheets that you were given: Pain Booklet and Surgical Site Infection Prevention

## 2014-05-13 ENCOUNTER — Ambulatory Visit (HOSPITAL_COMMUNITY): Admission: RE | Admit: 2014-05-13 | Payer: Medicare Other | Source: Ambulatory Visit

## 2014-05-13 ENCOUNTER — Encounter (HOSPITAL_COMMUNITY): Admission: RE | Payer: Self-pay | Source: Ambulatory Visit

## 2014-05-13 ENCOUNTER — Ambulatory Visit (HOSPITAL_COMMUNITY)
Admission: RE | Admit: 2014-05-13 | Payer: Medicare Other | Source: Ambulatory Visit | Admitting: Interventional Radiology

## 2014-05-13 ENCOUNTER — Telehealth: Payer: Self-pay | Admitting: Cardiology

## 2014-05-13 SURGERY — RADIOLOGY WITH ANESTHESIA
Anesthesia: General

## 2014-05-13 NOTE — Telephone Encounter (Signed)
New Message  Pt daughter wanted to speak w/ Rn about what to do going forward. Pt was d/c from hosp 4/11 and (per pt daughter) needs at home care as well as new prescriptions that have not been written. Pt has appt w/ Dr. Mare Ferrari on 5/16. Pt requested to speak w/ RN about plan of care. Please call back and dsicuss.

## 2014-05-13 NOTE — Telephone Encounter (Signed)
Left message to call back  Spoke with patient and ok to speak with Jeanne Lawson

## 2014-05-13 NOTE — Telephone Encounter (Signed)
Spoke with daughter and advised to call Arville Go to come out since procedure for today has been postponed  Did schedule patient appointment for next week

## 2014-05-18 ENCOUNTER — Ambulatory Visit (INDEPENDENT_AMBULATORY_CARE_PROVIDER_SITE_OTHER): Payer: Medicare Other | Admitting: Cardiology

## 2014-05-18 ENCOUNTER — Encounter: Payer: Self-pay | Admitting: Cardiology

## 2014-05-18 VITALS — BP 120/76 | HR 90 | Ht 61.0 in | Wt 143.8 lb

## 2014-05-18 DIAGNOSIS — G458 Other transient cerebral ischemic attacks and related syndromes: Secondary | ICD-10-CM | POA: Diagnosis not present

## 2014-05-18 DIAGNOSIS — I259 Chronic ischemic heart disease, unspecified: Secondary | ICD-10-CM

## 2014-05-18 DIAGNOSIS — I119 Hypertensive heart disease without heart failure: Secondary | ICD-10-CM | POA: Diagnosis not present

## 2014-05-18 NOTE — Progress Notes (Signed)
Cardiology Office Note   Date:  05/18/2014   ID:  Jeanne, Lawson [birth year], MRN [id number]  PCP:  [name], MD  Cardiologist:   Darlin Coco, MD   Chief Complaint  Patient presents with  . Hypertension      History of Present Illness: Jeanne Lawson is a 69 y.o. female who presents for a six-month follow-up office visit.  This pleasant 69 year old woman is seen for a scheduled followup office visit. She has a past history of a prior stroke. She's had problems with high blood pressure in the past. She has a history of prior coronary artery disease and 16 years ago Dr. Ilda Foil did cardiac catheterization and angioplasty but no stent. We don't have those records. The patient has had several subsequent Cardiolite stress tests which have been normal but have been poorly tolerated by the patient and she does not want any further nuclear stress tests. She does have a chronically abnormal EKG with anterolateral T wave inversion. Since last visit she's been doing well with no new cardiac symptoms. The date of her stroke was October 2010. She was recently readmitted to the hospital with another stroke. The patient was recently hospitalized with a stroke in April 2016.  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  She is scheduled to have radiology intervention soon by Dr. [name].  At the time of her the patient was talking to her daughter and she was noticed to have slurring of her speech. Her symptoms began to improve but she still had slow speech and some confusion. She was brought to the ED, and evaluated and a CT scan and MRI were performed which revealed a Small Infarct at the junction of the posterior superior aspect of the Lenticular Nucleus of the corona radiata as well as enlargement of her previous aneurysms. The patient has not been aware of any irregular heartbeat or pulse.  She has not been producing any chest pain or  angina.  Past Medical History  Diagnosis Date  . Ventricular hypertrophy 04/2009    LVH with diastolic dysfunction by echo. Has normal EF.  Marland Kitchen Pancreatitis     x2  . PAC (premature atrial contraction)   . TIA (transient ischemic attack)     She was hospitalized 04-23-09 through 04-27-09 for involving right side of the body  . 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 Hypertension   . Tobacco abuse     Ongoing   . Edema of foot     She has a history of chronic edema of the left dated back to age 87 when she suufered severe frostbite playing  on the snow as a child  . Coronary artery disease 1996    Known with prior mild lesion of LAD demonstrated by Cardiac Catheterization in 1996  . 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  Marland Kitchen COPD (chronic obstructive pulmonary disease)     Past Surgical History  Procedure Laterality Date  . 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 Neck surgery  50 yrs ago    Left side  . US echocardiography  04-26-2009    EF 65-70%  . Cardiovascular stress test  12-02-2001    EF 70%  . Cardiac catheterization  1996    Mild CAD with vasospasm  . Abdominal hysterectomy    . Laparoscopy       Current Outpatient Prescriptions  Medication Sig Dispense Refill  . albuterol (PROVENTIL HFA;VENTOLIN HFA) 108 (90 BASE) MCG/ACT inhaler Inhale 1-2 puffs into the lungs every 6 (six) hours as needed for wheezing or shortness of breath.    Marland Kitchen aspirin EC 81 MG tablet Take 81 mg by mouth daily.    Marland Kitchen atorvastatin (LIPITOR) 40 MG tablet Take 1 tablet (40 mg total) by mouth daily at 6 PM. (Patient taking differently: Take 40 mg by mouth daily. ) 30 tablet 0  . clopidogrel (PLAVIX) 75 MG tablet Take 1 tablet (75 mg total) by mouth daily. 30 tablet 5  . lisinopril-hydrochlorothiazide (PRINZIDE,ZESTORETIC) 20-12.5 MG per tablet Take  1 tablet by mouth daily. 30 tablet 0  . mometasone-formoterol (DULERA) 100-5 MCG/ACT AERO Inhale 2 puffs into the lungs 2 (two) times daily. (Patient not taking: Reported on 05/15/2014) 1 Inhaler 0  . TOPROL XL 50 MG 24 hr tablet TAKE 1/2 TABLET BY MOUTH TWICE DAILY. 30 tablet 3   No current facility-administered medications for this visit.    Allergies:   Dilaudid; Codeine; Hydromorphone hcl; and Sulfa drugs cross reactors    Social History:  The patient  reports that she quit smoking about 3 weeks ago. Her smoking use included Cigarettes. She has a 30 pack-year smoking history. She uses smokeless tobacco. She reports that she does not drink alcohol or use illicit drugs.   Family History:  The patient's family history includes Aneurysm in her sister; Emphysema in her father; Hypertension in her daughter and mother; Stroke in her mother; Thyroid disease in her daughter.    ROS:  Please see the history of present illness.   Otherwise, review of systems are positive for none.   All other systems are reviewed and negative.    PHYSICAL EXAM: VS:  BP 120/76 mmHg  Pulse 90  Ht 5\' 1"  (1.549 m)  Wt 143 lb 12.8 oz (65.227 kg)  BMI 27.18 kg/m2 , BMI Body mass index is 27.18 kg/(m^2). GEN: Well nourished, well developed, in no acute distress HEENT: normal Neck: no JVD, carotid bruits, or masses Cardiac: RRR; no murmurs, rubs, or gallops, she has chronic edema of her feet and evidence of venous insufficiency of the lower extremities. Respiratory:  clear to auscultation bilaterally, normal work of breathing GI: soft, nontender, nondistended, + BS MS: no deformity or atrophy Skin: warm and dry, no rash Neuro:  Strength and sensation are intact Psych: euthymic mood, full affect   EKG:  EKG is not ordered today.  EKG from recent hospitalization was reviewed and shows no change in T-wave abnormalities when compared to previous studies.    Recent Labs: 05/03/2014: ALT 20 05/12/2014: BUN 13;  Creatinine 0.66; Hemoglobin 14.0; Platelets 162; Potassium 3.6; Sodium 138    Lipid Panel    Component Value Date/Time   CHOL 165 05/03/2014 0634   TRIG 231* 05/03/2014 0634   HDL 44 05/03/2014 0634   CHOLHDL 3.8 05/03/2014 0634   VLDL 46* 05/03/2014 0634   LDLCALC 75 05/03/2014 0634      Wt Readings from Last 3 Encounters:  05/18/14 143 lb 12.8 oz (65.227 kg)  05/12/14 142 lb (64.411 kg)  05/02/14 140 lb 6.9 oz (63.7 kg)        ASSESSMENT AND PLAN:  1. 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 2. hypertensive heart  disease without heart failure 3.  History of multiple strokes.  Known aneurysms of the cerebral circulation 4. tobacco abuse.  Since her most recent hospitalization for stroke in April 2016 the patient quit smoking "cold [place]"  Current medicines are reviewed at length with the patient today.  The patient does not have concerns regarding medicines.  The following changes have been made:  no change  Labs/ tests ordered today include:  No orders of the defined types were placed in this encounter.    Disposition: Continue current medication.  Okay to proceed with proposed neuro radiology intervention next week.  Recheck here in 6 months for office visit and EKG  Signed, Darlin Coco, MD  05/18/2014 6:20 PM    [facility] [place], [place], [place]  [address] Phone: [phone]; Fax: [phone]

## 2014-05-18 NOTE — Patient Instructions (Signed)
Medication Instructions:  Your physician recommends that you continue on your current medications as directed. Please refer to the Current Medication list given to you today.  Labwork: none  Testing/Procedures: none  Follow-Up: Your physician wants you to follow-up in: 6 month ov/ekg  You will receive a reminder letter in the mail two months in advance. If you don't receive a letter, please call our office to schedule the follow-up appointment.     

## 2014-05-20 ENCOUNTER — Other Ambulatory Visit: Payer: Self-pay | Admitting: Radiology

## 2014-05-22 ENCOUNTER — Encounter (HOSPITAL_COMMUNITY): Payer: Self-pay | Admitting: *Deleted

## 2014-05-24 NOTE — Anesthesia Preprocedure Evaluation (Addendum)
Anesthesia Evaluation  Patient identified by MRN, date of birth, ID band Patient awake    Reviewed: Allergy & Precautions, NPO status , Patient's Chart, lab work & pertinent test results, reviewed documented beta blocker date and time   Airway Mallampati: II   Neck ROM: Full    Dental  (+) Partial Upper, Partial Lower, Dental Advisory Given   Pulmonary COPD COPD inhaler, former smoker (quit 04/2014 30 pack year),  breath sounds clear to auscultation        Cardiovascular hypertension, Pt. on medications + CAD and + Peripheral Vascular Disease Rhythm:Regular  EF 60% 2011, Cannot view most resent ECHO   Neuro/Psych SAH, aneurysm TIACVA    GI/Hepatic   Endo/Other    Renal/GU      Musculoskeletal   Abdominal (+)  Abdomen: soft.    Peds  Hematology 14/41   Anesthesia Other Findings   Reproductive/Obstetrics                           Anesthesia Physical Anesthesia Plan  ASA: III  Anesthesia Plan: General   Post-op Pain Management:    Induction: Intravenous  Airway Management Planned: Oral ETT  Additional Equipment: Arterial line  Intra-op Plan:   Post-operative Plan: Extubation in OR  Informed Consent: I have reviewed the patients History and Physical, chart, labs and discussed the procedure including the risks, benefits and alternatives for the proposed anesthesia with the patient or authorized representative who has indicated his/her understanding and acceptance.     Plan Discussed with:   Anesthesia Plan Comments:         Anesthesia Quick Evaluation

## 2014-05-25 ENCOUNTER — Encounter (HOSPITAL_COMMUNITY): Payer: Self-pay | Admitting: Certified Registered Nurse Anesthetist

## 2014-05-25 ENCOUNTER — Encounter (HOSPITAL_COMMUNITY): Payer: Self-pay

## 2014-05-25 ENCOUNTER — Ambulatory Visit (HOSPITAL_COMMUNITY): Payer: Medicare Other | Admitting: Anesthesiology

## 2014-05-25 ENCOUNTER — Ambulatory Visit (HOSPITAL_COMMUNITY)
Admission: RE | Admit: 2014-05-25 | Discharge: 2014-05-25 | Disposition: A | Discharge status: Home / Self Care | Payer: Medicare Other | Source: Ambulatory Visit | Attending: Interventional Radiology | Admitting: Interventional Radiology

## 2014-05-25 ENCOUNTER — Encounter (HOSPITAL_COMMUNITY)
Admission: RE | Disposition: A | Discharge status: Home / Self Care | Payer: Self-pay | Source: Ambulatory Visit | Attending: Interventional Radiology

## 2014-05-25 DIAGNOSIS — I671 Cerebral aneurysm, nonruptured: Secondary | ICD-10-CM | POA: Diagnosis not present

## 2014-05-25 DIAGNOSIS — Z7902 Long term (current) use of antithrombotics/antiplatelets: Secondary | ICD-10-CM | POA: Diagnosis not present

## 2014-05-25 DIAGNOSIS — Z79899 Other long term (current) drug therapy: Secondary | ICD-10-CM | POA: Insufficient documentation

## 2014-05-25 DIAGNOSIS — I639 Cerebral infarction, unspecified: Secondary | ICD-10-CM

## 2014-05-25 DIAGNOSIS — Z7982 Long term (current) use of aspirin: Secondary | ICD-10-CM | POA: Diagnosis not present

## 2014-05-25 DIAGNOSIS — Z87891 Personal history of nicotine dependence: Secondary | ICD-10-CM | POA: Insufficient documentation

## 2014-05-25 DIAGNOSIS — Z8673 Personal history of transient ischemic attack (TIA), and cerebral infarction without residual deficits: Secondary | ICD-10-CM | POA: Diagnosis not present

## 2014-05-25 DIAGNOSIS — I2510 Atherosclerotic heart disease of native coronary artery without angina pectoris: Secondary | ICD-10-CM | POA: Diagnosis not present

## 2014-05-25 DIAGNOSIS — J449 Chronic obstructive pulmonary disease, unspecified: Secondary | ICD-10-CM | POA: Diagnosis not present

## 2014-05-25 DIAGNOSIS — I517 Cardiomegaly: Secondary | ICD-10-CM | POA: Insufficient documentation

## 2014-05-25 DIAGNOSIS — I10 Essential (primary) hypertension: Secondary | ICD-10-CM | POA: Insufficient documentation

## 2014-05-25 HISTORY — PX: RADIOLOGY WITH ANESTHESIA: SHX6223

## 2014-05-25 LAB — PLATELET INHIBITION P2Y12: PLATELET FUNCTION P2Y12: 111 [PRU] — AB (ref 194–418)

## 2014-05-25 LAB — POCT ACTIVATED CLOTTING TIME: Activated Clotting Time: 165 seconds

## 2014-05-25 SURGERY — RADIOLOGY WITH ANESTHESIA
Anesthesia: General

## 2014-05-25 MED ORDER — PHENYLEPHRINE HCL 10 MG/ML IJ SOLN
INTRAMUSCULAR | Status: DC | PRN
  Administered 2014-05-25 (×4): 80 ug via INTRAVENOUS

## 2014-05-25 MED ORDER — GLYCOPYRROLATE 0.2 MG/ML IJ SOLN
INTRAMUSCULAR | Status: DC | PRN
  Administered 2014-05-25: 1 mg via INTRAVENOUS

## 2014-05-25 MED ORDER — HEPARIN SODIUM (PORCINE) 1000 UNIT/ML IJ SOLN
INTRAMUSCULAR | Status: DC | PRN
  Administered 2014-05-25: 1 mL via INTRAVENOUS
  Administered 2014-05-25: 2 mL via INTRAVENOUS

## 2014-05-25 MED ORDER — ESMOLOL HCL 10 MG/ML IV SOLN
INTRAVENOUS | Status: DC | PRN
  Administered 2014-05-25: 30 mg via INTRAVENOUS

## 2014-05-25 MED ORDER — EPHEDRINE SULFATE 50 MG/ML IJ SOLN
INTRAMUSCULAR | Status: DC | PRN
  Administered 2014-05-25 (×3): 10 mg via INTRAVENOUS

## 2014-05-25 MED ORDER — LACTATED RINGERS IV SOLN
INTRAVENOUS | Status: DC
Start: 2014-05-25 — End: 2014-05-25
  Administered 2014-05-25 (×3): via INTRAVENOUS

## 2014-05-25 MED ORDER — FENTANYL CITRATE (PF) 100 MCG/2ML IJ SOLN: INTRAMUSCULAR | Status: DC | PRN

## 2014-05-25 MED ORDER — CLOPIDOGREL BISULFATE 75 MG PO TABS: 75.0000 mg | ORAL_TABLET | ORAL | Status: DC

## 2014-05-25 MED ORDER — PROPOFOL 10 MG/ML IV BOLUS
INTRAVENOUS | Status: DC | PRN
  Administered 2014-05-25: 150 mg via INTRAVENOUS

## 2014-05-25 MED ORDER — KETOROLAC TROMETHAMINE 30 MG/ML IJ SOLN
30.0000 mg | Freq: Once | INTRAMUSCULAR | Status: AC
  Administered 2014-05-25: 30 mg via INTRAVENOUS
  Filled 2014-05-25: qty 1

## 2014-05-25 MED ORDER — ASPIRIN 81 MG PO CHEW
CHEWABLE_TABLET | ORAL | Status: AC
  Administered 2014-05-25: 81 mg via ORAL
  Filled 2014-05-25: qty 3

## 2014-05-25 MED ORDER — LIDOCAINE HCL 1 % IJ SOLN
INTRAMUSCULAR | Status: AC
  Filled 2014-05-25: qty 20

## 2014-05-25 MED ORDER — DEXAMETHASONE SODIUM PHOSPHATE 4 MG/ML IJ SOLN
INTRAMUSCULAR | Status: DC | PRN
  Administered 2014-05-25: 4 mg via INTRAVENOUS

## 2014-05-25 MED ORDER — FENTANYL CITRATE (PF) 250 MCG/5ML IJ SOLN
INTRAMUSCULAR | Status: DC | PRN
  Administered 2014-05-25: 100 ug via INTRAVENOUS

## 2014-05-25 MED ORDER — NIMODIPINE 30 MG PO CAPS: 0.0000 mg | ORAL_CAPSULE | ORAL | Status: DC

## 2014-05-25 MED ORDER — PROMETHAZINE HCL 25 MG/ML IJ SOLN
INTRAMUSCULAR | Status: AC
  Filled 2014-05-25: qty 1

## 2014-05-25 MED ORDER — DEXTROSE 5 % IV SOLN
10.0000 mg | INTRAVENOUS | Status: DC | PRN
  Administered 2014-05-25: 20 ug/min via INTRAVENOUS

## 2014-05-25 MED ORDER — CEFAZOLIN SODIUM-DEXTROSE 2-3 GM-% IV SOLR
2.0000 g | Freq: Once | INTRAVENOUS | Status: AC
  Administered 2014-05-25: 2 g via INTRAVENOUS
  Filled 2014-05-25: qty 50

## 2014-05-25 MED ORDER — NITROGLYCERIN 1 MG/10 ML FOR IR/CATH LAB
INTRA_ARTERIAL | Status: AC
  Filled 2014-05-25: qty 10

## 2014-05-25 MED ORDER — ONDANSETRON HCL 4 MG/2ML IJ SOLN
INTRAMUSCULAR | Status: DC | PRN
  Administered 2014-05-25: 4 mg via INTRAVENOUS

## 2014-05-25 MED ORDER — NIMODIPINE 60 MG/20ML PO SOLN
0.0000 mg | ORAL | Status: DC
  Filled 2014-05-25: qty 20

## 2014-05-25 MED ORDER — ONDANSETRON HCL 4 MG/2ML IJ SOLN
INTRAMUSCULAR | Status: AC
  Administered 2014-05-25: 4 mg via INTRAVENOUS
  Filled 2014-05-25: qty 2

## 2014-05-25 MED ORDER — ASPIRIN EC 325 MG PO TBEC: 325.0000 mg | DELAYED_RELEASE_TABLET | ORAL | Status: DC

## 2014-05-25 MED ORDER — MIDAZOLAM HCL 5 MG/5ML IJ SOLN
INTRAMUSCULAR | Status: DC | PRN
  Administered 2014-05-25: 2 mg via INTRAVENOUS

## 2014-05-25 MED ORDER — ONDANSETRON HCL 4 MG/2ML IJ SOLN
4.0000 mg | Freq: Once | INTRAMUSCULAR | Status: AC
  Administered 2014-05-25: 4 mg via INTRAVENOUS

## 2014-05-25 MED ORDER — IOHEXOL 300 MG/ML  SOLN
150.0000 mL | Freq: Once | INTRAMUSCULAR | Status: AC | PRN
  Administered 2014-05-25: 100 mL via INTRA_ARTERIAL

## 2014-05-25 MED ORDER — NEOSTIGMINE METHYLSULFATE 10 MG/10ML IV SOLN
INTRAVENOUS | Status: DC | PRN
  Administered 2014-05-25: 5 mg via INTRAVENOUS

## 2014-05-25 MED ORDER — SODIUM CHLORIDE 0.9 % IV SOLN: INTRAVENOUS | Status: AC

## 2014-05-25 MED ORDER — PROMETHAZINE HCL 25 MG/ML IJ SOLN
12.5000 mg | Freq: Once | INTRAMUSCULAR | Status: AC
  Administered 2014-05-25: 12.5 mg via INTRAVENOUS

## 2014-05-25 MED ORDER — ROCURONIUM BROMIDE 100 MG/10ML IV SOLN
INTRAVENOUS | Status: DC | PRN
  Administered 2014-05-25: 10 mg via INTRAVENOUS
  Administered 2014-05-25: 40 mg via INTRAVENOUS
  Administered 2014-05-25: 30 mg via INTRAVENOUS

## 2014-05-25 NOTE — Anesthesia Postprocedure Evaluation (Signed)
  Anesthesia Post-op Note  Patient: Jeanne Lawson  Procedure(s) Performed: Procedure(s): RADIOLOGY WITH ANESTHESIA (N/A)  Patient Location: PACU  Anesthesia Type:General  Level of Consciousness: awake and alert   Airway and Oxygen Therapy: Patient Spontanous Breathing and Patient connected to nasal cannula oxygen  Post-op Pain: mild  Post-op Assessment: Post-op Vital signs reviewed, Patient's Cardiovascular Status Stable, Respiratory Function Stable, Patent Airway and No signs of Nausea or vomiting  Post-op Vital Signs: Reviewed and stable  Last Vitals:  Filed Vitals:   05/25/14 0625  BP: 145/56  Pulse: 51  Temp: 36.2 C  Resp: 18    Complications: No apparent anesthesia complications

## 2014-05-25 NOTE — Anesthesia Procedure Notes (Signed)
Procedure Name: Intubation Date/Time: 05/25/2014 9:08 AM Performed by: Ollen Bowl Pre-anesthesia Checklist: Patient identified, Emergency Drugs available, Suction available, Patient being monitored and Timeout performed Patient Re-evaluated:Patient Re-evaluated prior to inductionOxygen Delivery Method: Circle system utilized and Simple face mask Preoxygenation: Pre-oxygenation with 100% oxygen Intubation Type: IV induction Ventilation: Mask ventilation without difficulty Laryngoscope Size: Miller and 2 Grade View: Grade I Tube type: Oral Tube size: 7.5 mm Number of attempts: 1 Airway Equipment and Method: Patient positioned with wedge pillow and Stylet Placement Confirmation: ETT inserted through vocal cords under direct vision,  positive ETCO2 and breath sounds checked- equal and bilateral Secured at: 21 cm Tube secured with: Tape Dental Injury: Teeth and Oropharynx as per pre-operative assessment

## 2014-05-25 NOTE — Transfer of Care (Signed)
Immediate Anesthesia Transfer of Care Note  Patient: Jeanne Lawson  Procedure(s) Performed: Procedure(s): RADIOLOGY WITH ANESTHESIA (N/A)  Patient Location: PACU  Anesthesia Type:General  Level of Consciousness: awake and alert   Airway & Oxygen Therapy: Patient Spontanous Breathing and Patient connected to nasal cannula oxygen  Post-op Assessment: Report given to RN, Post -op Vital signs reviewed and stable, Patient moving all extremities X 4 and Patient able to stick tongue midline  Post vital signs: Reviewed and stable  Last Vitals:  Filed Vitals:   05/25/14 1200  BP:   Pulse: 63  Temp: 36.5 C  Resp: 24    Complications: No apparent anesthesia complications

## 2014-05-25 NOTE — Progress Notes (Signed)
Jannifer Franklin PA spoke with Dr. [name] re; patient's HR 51, will hold Nimodipine Per Dr. Arlean Hopping order.

## 2014-05-25 NOTE — Progress Notes (Signed)
Jannifer Franklin PA Notified of families concern of patients speech being slow.  She came to the bed side and examined patient and said she was having side effects of medication.  No concerned over patient having stroke.  Shared with family to address their concerns.

## 2014-05-25 NOTE — Procedures (Signed)
S/P bilateral common carotid arteriograms. RT CFa approach. Findings. 1.approx 68mmx 8.15mm RT PCOM region aneurysm. 2.Approx 7.68mmx 43mm Lt ICA terminus aneurysm

## 2014-05-25 NOTE — Progress Notes (Signed)
Patient ID: JYLA HOPF, female   DOB: [birth year], 69 y.o.   MRN: [id number] Post procedure  More awake ,alert . C/O bifrontal  Ache with mild nausea. Denies any vomitting.Also C/O low back pain from lying still. Denies any new limb weakness,numbness ,visual symptoms,difficulty swallowing or speech difficulties. Denies any chest pains or shortness of breath.  O/E VS BP 140s/80s HR 70s to 80s SR PAO2 > 95% RA. Neurlologically awake,alert Ox 3 speech and comprehension normal  PERLA 19mm  EOMS full  No facial asymmetry Tongue midline.  Motor Mild pronation of LUE with 5-/5 power in Lt UE and LLE due to old stroke. Fine motor movements Rt = LT   Station and gait not tested.  RT groin soft . No palpable hematoma. Peripheries warm..  Plan . Discussed procedure with findings with patient and family .Questions answered to their satisfaction. Patient and family to return to clinic in next few days for further management. DR [name] MD

## 2014-05-25 NOTE — H&P (Signed)
Chief Complaint: Cerebral aneurysm  Referring Physician(s) Deveshwar,Sanjeev Dr Lavera Guise  History of Present Illness: Jeanne Lawson is a 69 y.o. 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 Ventricular hypertrophy 04/2009    LVH with diastolic dysfunction by echo. Has normal EF.  Marland Kitchen Pancreatitis     x2  . PAC (premature atrial contraction)   . TIA (transient ischemic attack)     She was hospitalized 04-23-09 through 04-27-09 for involving right side of the body  . 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 Hypertension   . Tobacco abuse     Ongoing   . Edema of foot     She has a history of chronic edema of the left dated back to age 29 when she suufered severe frostbite playing  on the snow as a child  . Coronary artery disease 1996    Known with prior mild lesion of LAD demonstrated by Cardiac Catheterization in 1996  . 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  Marland Kitchen COPD (chronic obstructive pulmonary disease)     Past Surgical History  Procedure Laterality Date  . 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 Neck surgery  50 yrs ago    Left side  . US echocardiography  04-26-2009    EF 65-70%  . Cardiovascular stress test  12-02-2001    EF 70%  . Cardiac catheterization  1996    Mild CAD with vasospasm  . Abdominal hysterectomy    . Laparoscopy      Allergies: Dilaudid; Codeine; Hydromorphone hcl; and Sulfa  drugs cross reactors  Medications: Prior to Admission medications   Medication Sig Start Date End Date Taking? Authorizing Provider  albuterol (PROVENTIL HFA;VENTOLIN HFA) 108 (90 BASE) MCG/ACT inhaler Inhale 1-2 puffs into the lungs every 6 (six) hours as needed for wheezing or shortness of breath.   Yes Historical Provider, MD  aspirin EC 81 MG tablet Take 81 mg by mouth daily.   Yes Historical Provider, MD  atorvastatin (LIPITOR) 40 MG tablet Take 1 tablet (40 mg total) by mouth daily at 6 PM. Patient taking differently: Take 40 mg by mouth daily.  05/04/14  Yes Janece Canterbury, MD  clopidogrel (PLAVIX) 75 MG tablet Take 1 tablet (75 mg total) by mouth daily. 05/13/13  Yes Darlin Coco, MD  lisinopril-hydrochlorothiazide (PRINZIDE,ZESTORETIC) 20-12.5 MG per tablet Take 1 tablet by mouth daily. 05/04/14  Yes Janece Canterbury, MD  TOPROL XL 50 MG 24 hr tablet TAKE 1/2 TABLET BY MOUTH TWICE DAILY. 02/04/14  Yes Darlin Coco, MD  mometasone-formoterol (DULERA) 100-5 MCG/ACT AERO Inhale 2 puffs into the lungs 2 (two) times daily. Patient not taking: Reported on 05/15/2014 05/04/14   Janece Canterbury, MD     Family History  Problem Relation Age of Onset  . Emphysema Father   . Aneurysm Sister   . Stroke Mother   . Hypertension Mother   .  Hypertension Daughter   . Thyroid disease Daughter     History   Social History  . Marital Status: Widowed    Spouse Name: N/A  . Number of Children: N/A  . Years of Education: N/A   Social History Main Topics  . Smoking status: Former Smoker -- 1.00 packs/day for 30 years    Types: Cigarettes    Quit date: 04/26/2014  . Smokeless tobacco: Never Used  . Alcohol Use: No  . Drug Use: No  . Sexual Activity: Not on file   Other Topics Concern  . None   Social History Narrative     Review of Systems: A 12 point ROS discussed and pertinent positives are indicated in the HPI above.  All other systems are negative.  Review of Systems    Constitutional: Positive for activity change and fatigue. Negative for fever, appetite change and unexpected weight change.  HENT: Negative for hearing loss, tinnitus and trouble swallowing.   Respiratory: Negative for cough and shortness of breath.   Cardiovascular: Negative for chest pain.  Gastrointestinal: Negative for nausea and vomiting.  Musculoskeletal: Negative for neck pain.  Skin: Negative for color change.  Neurological: Positive for dizziness, weakness, light-headedness and headaches. Negative for tremors, seizures, syncope, facial asymmetry, speech difficulty and numbness.  Psychiatric/Behavioral: Negative for behavioral problems and confusion.    Vital Signs: There were no vitals taken for this visit.  Physical Exam  Constitutional: She is oriented to person, place, and time. She appears well-developed and well-nourished.  Eyes: EOM are normal.  Neck: Normal range of motion. Neck supple.  Cardiovascular: Normal rate and regular rhythm.   No murmur heard. Pulmonary/Chest: Effort normal and breath sounds normal. She has no wheezes.  Abdominal: Soft. Bowel sounds are normal. There is no tenderness.  Musculoskeletal: Normal range of motion. She exhibits no tenderness.  Neurological: She is alert and oriented to person, place, and time.  Skin: Skin is warm and dry.  Psychiatric: She has a normal mood and affect. Her behavior is normal. Judgment and thought content normal.  Nursing note and vitals reviewed.   Mallampati Score:  MD Evaluation Airway: WNL Heart: WNL Abdomen: WNL Chest/ Lungs: WNL ASA  Classification: 3 Mallampati/Airway Score: One  Imaging: Dg Chest 2 View  05/02/2014   CLINICAL DATA:  Productive cough. Shortness of breath. History smoking 2-3 cigarettes per day.  EXAM: CHEST - 2 VIEW  COMPARISON:  Two-view chest x-ray 04/22/2014.  FINDINGS: Emphysematous changes are again noted. The heart is enlarged. There is no edema or effusion to suggest failure.  No focal airspace disease is evident. The visualized soft tissues and bony thorax are unremarkable.  IMPRESSION: 1. Stable cardiomegaly without failure. 2. Emphysema.   Electronically Signed   By: San Morelle M.D.   On: 05/02/2014 19:34   Ct Head Wo Contrast  05/02/2014   CLINICAL DATA:  Code stroke. Slurred speech. Leaning towards the left side. History of TIA, CVA, hypertension and CAD.  EXAM: CT HEAD WITHOUT CONTRAST  TECHNIQUE: Contiguous axial images were obtained from the base of the skull through the vertex without intravenous contrast.  COMPARISON:  04/25/2009; 04/24/2009; brain MRI - 04/24/2009  FINDINGS: Similar findings of advanced atrophy with sulcal prominence and prominence of the extra-axial spaces about the bilateral parietal cortices. Extensive periventricular hypodensities compatible with microvascular ischemic disease, left greater than right. There is unchanged apparent focal ectasia involving the central aspect of the left MCA (image 12, series 201). Given extensive background parenchymal abnormalities,  there is no CT evidence of superimposed acute large territory infarct. No intraparenchymal or extra-axial mass or hemorrhage. Unchanged size and configuration of the ventricles and basilar cisterns. No midline shift.  Nearly confluent opacification of the bilateral anterior and posterior ethmoidal air cells. Air-fluid levels and mucosal thickening are seen with the bilateral maxillary sinuses. The sphenoid and frontal sinuses appear normally aerated. Mastoid air cells are normally aerated. Regional soft tissues appear normal. No displaced calvarial fracture.  IMPRESSION: 1. Similar findings of atrophy and extensive microvascular ischemic disease without definite superimposed acute intracranial process. 2. Extensive sinus disease, including air-fluid levels within the bilateral maxillary sinuses, as above. Critical Value/emergent results were called by telephone at the time of  interpretation on 05/02/2014 at 1:17 pm to Dr. Leonel Ramsay, who verbally acknowledged these results.   Electronically Signed   By: Sandi Mariscal M.D.   On: 05/02/2014 13:23   Mr Virgel Paling Wo Contrast  05/02/2014   CLINICAL DATA:  69 year old hypertensive female with history of atrial fibrillation and known cerebral aneurysms presenting with abnormal speech and transient facial droop. Initial encounter.  EXAM: MR HEAD WITHOUT CONTRAST  MR CIRCLE OF WILLIS WITHOUT CONTRAST  MRA OF THE NECK WITHOUT AND WITH CONTRAST  TECHNIQUE: Multiplanar, multiecho pulse sequences of the brain and surrounding structures were obtained according to standard protocol without intravenous contrast.; Angiographic images of the Circle of Willis were obtained using MRA technique without intravenous contrast.; Multiplanar and multiecho pulse sequences of the neck were obtained without and with intravenous contrast. Angiographic images of the neck were obtained using MRA technique without and with intravenous contast.  CONTRAST:  59mL MULTIHANCE GADOBENATE DIMEGLUMINE 529 MG/ML IV SOLN  COMPARISON:  05/02/2014 head CT. 04/24/2009 brain MR and MR angiogram.  FINDINGS: MR HEAD FINDINGS  Exam is motion degraded. Sagittal T1 weighted imaging not performed.  Acute nonhemorrhagic small infarct at the junction of the posterior superior aspect of the left lenticular nucleus and the corona radiata.  Remote bilateral centrum semiovale/ corona radiata infarcts. Remote bilateral thalamic and basal ganglia small infarcts. Prominent small vessel disease type changes.  No intracranial hemorrhage.  Global atrophy without hydrocephalus.  No intracranial mass lesion noted on this unenhanced exam.  Aneurysms as discussed below.  MR CIRCLE OF WILLIS FINDINGS  Exam is motion degraded.  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  Aneurysm arising from the right  posterior communicating artery region has increased in size now measuring 9 x 7 x 6 mm versus prior 6 x 5 x 6 mm  Fusiform ectasia of the right internal carotid artery distal vertical segment similar to prior exam.  Moderate narrowing of portions of the A1 segment of the right anterior cerebral artery.  Middle cerebral artery moderate branch vessel narrowing and irregularity.  A2 segment anterior cerebral artery moderate narrowing and irregularity.  Right vertebral artery ends in a posterior inferior cerebellar artery distribution. Mild to moderate narrowing of portions of the distal right vertebral artery.  Slight irregularity with mild narrowing distal left vertebral artery and basilar artery.  Nonvisualized left posterior inferior cerebellar artery.  Nonvisualized right anterior inferior cerebellar artery.  Fetal type contribution to the right posterior cerebral artery.  Moderate narrowing P1 -2 junction left posterior cerebral artery.  MRA NECK FINDINGS  Evaluation of aortic arch and great vessels limited by motion. Two vessel aortic arch.  Ectatic common carotid  arteries.  No significant stenosis of either carotid bifurcation.  Ectatic vertical cervical segment of the internal carotid artery bilaterally.  Limited evaluation of the origin of the vertebral arteries. Left vertebral artery is dominant. Areas of narrowing, regularity ectasia of vertebral arteries bilaterally.  IMPRESSION: MR HEAD  Exam is motion degraded. Sagittal T1 weighted imaging not performed.  Acute nonhemorrhagic small infarct at the junction of the posterior superior aspect of the left lenticular nucleus and the corona radiata.  Remote bilateral centrum semiovale/ corona radiata infarcts. Remote bilateral thalamic and basal ganglia small infarcts. Prominent small vessel disease type changes.  No intracranial hemorrhage.  MR CIRCLE OF WILLIS  Exam is motion degraded.  Aneurysm left carotid terminus projecting superiorly and posteriorly has  increased in size now measuring 8 x 7 x 6 mm versus prior 6 x 5 x 5 mm.  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  Moderate narrowing of portions of the A1 segment of the right anterior cerebral artery.  Middle cerebral artery moderate branch vessel narrowing and irregularity.  A2 segment anterior cerebral artery moderate narrowing and irregularity.  Right vertebral artery ends in a posterior inferior cerebellar artery distribution. Mild to moderate narrowing of portions of the distal right vertebral artery.  Slight irregularity with mild narrowing distal left vertebral artery and basilar artery.  Nonvisualized left posterior inferior cerebellar artery.  Nonvisualized right anterior inferior cerebellar artery.  Fetal type contribution to the right posterior cerebral artery.  Moderate narrowing P1 -2 junction left posterior cerebral artery.  MRA NECK  Evaluation of aortic arch and great vessels limited by motion.  Ectatic common carotid arteries.  No significant stenosis of either carotid bifurcation.  Ectatic vertical cervical segment of the internal carotid artery bilaterally.  Limited evaluation of the origin of the vertebral arteries. Left vertebral artery is dominant. Areas of narrowing, regularity ectasia of vertebral arteries bilaterally.   Electronically Signed   By: Genia Del M.D.   On: 05/02/2014 18:20   Mr Angiogram Neck W Wo Contrast  05/02/2014   CLINICAL DATA:  69 year old hypertensive female with history of atrial fibrillation and known cerebral aneurysms presenting with abnormal speech and transient facial droop. Initial encounter.  EXAM: MR HEAD WITHOUT CONTRAST  MR CIRCLE OF WILLIS WITHOUT CONTRAST  MRA OF THE NECK WITHOUT AND WITH CONTRAST  TECHNIQUE: Multiplanar, multiecho pulse sequences  of the brain and surrounding structures were obtained according to standard protocol without intravenous contrast.; Angiographic images of the Circle of Willis were obtained using MRA technique without intravenous contrast.; Multiplanar and multiecho pulse sequences of the neck were obtained without and with intravenous contrast. Angiographic images of the neck were obtained using MRA technique without and with intravenous contast.  CONTRAST:  44mL MULTIHANCE GADOBENATE DIMEGLUMINE 529 MG/ML IV SOLN  COMPARISON:  05/02/2014 head CT. 04/24/2009 brain MR and MR angiogram.  FINDINGS: MR HEAD FINDINGS  Exam is motion degraded. Sagittal T1 weighted imaging not performed.  Acute nonhemorrhagic small infarct at the junction of the posterior superior aspect of the left lenticular nucleus and the corona radiata.  Remote bilateral centrum semiovale/ corona radiata infarcts. Remote bilateral thalamic and basal ganglia small infarcts. Prominent small vessel disease type changes.  No intracranial hemorrhage.  Global atrophy without hydrocephalus.  No intracranial mass lesion  noted on this unenhanced exam.  Aneurysms as discussed below.  MR CIRCLE OF WILLIS FINDINGS  Exam is motion degraded.  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  Moderate narrowing of portions of the A1 segment of the right anterior cerebral artery.  Middle cerebral artery moderate branch vessel narrowing and irregularity.  A2 segment anterior cerebral artery moderate narrowing and irregularity.  Right vertebral artery ends in a posterior inferior cerebellar artery distribution. Mild to moderate narrowing of  portions of the distal right vertebral artery.  Slight irregularity with mild narrowing distal left vertebral artery and basilar artery.  Nonvisualized left posterior inferior cerebellar artery.  Nonvisualized right anterior inferior cerebellar artery.  Fetal type contribution to the right posterior cerebral artery.  Moderate narrowing P1 -2 junction left posterior cerebral artery.  MRA NECK FINDINGS  Evaluation of aortic arch and great vessels limited by motion. Two vessel aortic arch.  Ectatic common carotid arteries.  No significant stenosis of either carotid bifurcation.  Ectatic vertical cervical segment of the internal carotid artery bilaterally.  Limited evaluation of the origin of the vertebral arteries. Left vertebral artery is dominant. Areas of narrowing, regularity ectasia of vertebral arteries bilaterally.  IMPRESSION: MR HEAD  Exam is motion degraded. Sagittal T1 weighted imaging not performed.  Acute nonhemorrhagic small infarct at the junction of the posterior superior aspect of the left lenticular nucleus and the corona radiata.  Remote bilateral centrum semiovale/ corona radiata infarcts. Remote bilateral thalamic and basal ganglia small infarcts. Prominent small vessel disease type changes.  No intracranial hemorrhage.  MR CIRCLE OF WILLIS  Exam is motion degraded.  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  Moderate narrowing of portions of the A1 segment of the right anterior cerebral artery.  Middle cerebral artery moderate branch vessel narrowing and irregularity.  A2 segment anterior cerebral artery moderate narrowing and irregularity.  Right  vertebral artery ends in a posterior inferior cerebellar artery distribution. Mild to moderate narrowing of portions of the distal right vertebral artery.  Slight irregularity with mild narrowing distal left vertebral artery and basilar artery.  Nonvisualized left posterior inferior cerebellar artery.  Nonvisualized right anterior inferior cerebellar artery.  Fetal type contribution to the right posterior cerebral artery.  Moderate narrowing P1 -2 junction left posterior cerebral artery.  MRA NECK  Evaluation of aortic arch and great vessels limited by motion.  Ectatic common carotid arteries.  No significant stenosis of either carotid bifurcation.  Ectatic vertical cervical segment of the internal carotid artery bilaterally.  Limited evaluation of the origin of the vertebral arteries. Left vertebral artery is dominant. Areas of narrowing, regularity ectasia of vertebral arteries bilaterally.   Electronically Signed   By: Alcide Evener.D.  On: 05/02/2014 18:20   Mr Brain Wo Contrast  05/02/2014   CLINICAL DATA:  69 year old hypertensive female with history of atrial fibrillation and known cerebral aneurysms presenting with abnormal speech and transient facial droop. Initial encounter.  EXAM: MR HEAD WITHOUT CONTRAST  MR CIRCLE OF WILLIS WITHOUT CONTRAST  MRA OF THE NECK WITHOUT AND WITH CONTRAST  TECHNIQUE: Multiplanar, multiecho pulse sequences of the brain and surrounding structures were obtained according to standard protocol without intravenous contrast.; Angiographic images of the Circle of Willis were obtained using MRA technique without intravenous contrast.; Multiplanar and multiecho pulse sequences of the neck were obtained without and with intravenous contrast. Angiographic images of the neck were obtained using MRA technique without and with intravenous contast.  CONTRAST:  37mL MULTIHANCE GADOBENATE DIMEGLUMINE 529 MG/ML IV SOLN  COMPARISON:  05/02/2014 head CT. 04/24/2009 brain MR and MR angiogram.   FINDINGS: MR HEAD FINDINGS  Exam is motion degraded. Sagittal T1 weighted imaging not performed.  Acute nonhemorrhagic small infarct at the junction of the posterior superior aspect of the left lenticular nucleus and the corona radiata.  Remote bilateral centrum semiovale/ corona radiata infarcts. Remote bilateral thalamic and basal ganglia small infarcts. Prominent small vessel disease type changes.  No intracranial hemorrhage.  Global atrophy without hydrocephalus.  No intracranial mass lesion noted on this unenhanced exam.  Aneurysms as discussed below.  MR CIRCLE OF WILLIS FINDINGS  Exam is motion degraded.  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  Moderate narrowing of portions of the A1 segment of the right anterior cerebral artery.  Middle cerebral artery moderate branch vessel narrowing and irregularity.  A2 segment anterior cerebral artery moderate narrowing and irregularity.  Right vertebral artery ends in a posterior inferior cerebellar artery distribution. Mild to moderate narrowing of portions of the distal right vertebral artery.  Slight irregularity with mild narrowing distal left vertebral artery and basilar artery.  Nonvisualized left posterior inferior cerebellar artery.  Nonvisualized right anterior inferior cerebellar artery.  Fetal type contribution to the right posterior cerebral artery.  Moderate narrowing P1 -2 junction left posterior cerebral artery.  MRA NECK FINDINGS  Evaluation of aortic arch and great vessels limited by motion. Two vessel aortic arch.  Ectatic common carotid arteries.  No significant stenosis of either carotid bifurcation.   Ectatic vertical cervical segment of the internal carotid artery bilaterally.  Limited evaluation of the origin of the vertebral arteries. Left vertebral artery is dominant. Areas of narrowing, regularity ectasia of vertebral arteries bilaterally.  IMPRESSION: MR HEAD  Exam is motion degraded. Sagittal T1 weighted imaging not performed.  Acute nonhemorrhagic small infarct at the junction of the posterior superior aspect of the left lenticular nucleus and the corona radiata.  Remote bilateral centrum semiovale/ corona radiata infarcts. Remote bilateral thalamic and basal ganglia small infarcts. Prominent small vessel disease type changes.  No intracranial hemorrhage.  MR CIRCLE OF WILLIS  Exam is motion degraded.  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  Aneurysm arising from  the right posterior communicating artery region has increased in size now measuring 9 x 7 x 6 mm versus prior 6 x 5 x 6 mm  Fusiform ectasia of the right internal carotid artery distal vertical segment similar to prior exam.  Moderate narrowing of portions of the A1 segment of the right anterior cerebral artery.  Middle cerebral artery moderate branch vessel narrowing and irregularity.  A2 segment anterior cerebral artery moderate narrowing and irregularity.  Right vertebral artery ends in a posterior inferior cerebellar artery distribution. Mild to moderate narrowing of portions of the distal right vertebral artery.  Slight irregularity with mild narrowing distal left vertebral artery and basilar artery.  Nonvisualized left posterior inferior cerebellar artery.  Nonvisualized right anterior inferior cerebellar artery.  Fetal type contribution to the right posterior cerebral artery.  Moderate narrowing P1 -2 junction left posterior cerebral artery.  MRA NECK  Evaluation of aortic arch and great vessels limited by motion.  Ectatic common  carotid arteries.  No significant stenosis of either carotid bifurcation.  Ectatic vertical cervical segment of the internal carotid artery bilaterally.  Limited evaluation of the origin of the vertebral arteries. Left vertebral artery is dominant. Areas of narrowing, regularity ectasia of vertebral arteries bilaterally.   Electronically Signed   By: Genia Del M.D.   On: 05/02/2014 18:20   US Abdomen Limited  05/04/2014   CLINICAL DATA:  Palmar erythema.  Question cirrhosis.  EXAM: US ABDOMEN LIMITED - RIGHT UPPER QUADRANT  COMPARISON:  None.  FINDINGS: Gallbladder:  No gallstones or wall thickening visualized. No sonographic Murphy sign noted.  Common bile duct:  Diameter: 0.7 cm  Liver:  Diffusely increased echogenicity is consistent with fatty infiltration. No focal lesion or evidence of cirrhosis is identified. There is no intrahepatic biliary ductal dilatation.  IMPRESSION: Fatty infiltration of the liver. The examination is otherwise negative.   Electronically Signed   By: Inge Rise M.D.   On: 05/04/2014 07:40    Labs:  CBC:  Recent Labs  04/22/14 0629 05/02/14 1313 05/02/14 1324 05/04/14 0703 05/12/14 1450  WBC 6.3 6.1  --  5.4 5.6  HGB 14.0 13.8 15.0 13.7 14.0  HCT 49.8* 40.0 44.0 40.6 41.2  PLT 128* 145*  --  128* 162    COAGS:  Recent Labs  05/02/14 1313 05/12/14 1450  INR 1.02 1.02  APTT 29  --     BMP:  Recent Labs  05/02/14 1313 05/02/14 1324 05/03/14 1910 05/04/14 0703 05/12/14 1450  NA 140 141 138 139 138  K 3.9 4.0 3.8 4.2 3.6  CL 104 104 101 105 100  CO2 23  --  31 28 29   GLUCOSE 103* 104* 123* 104* 93  BUN 8 8 9 11 13   CALCIUM 9.0  --  9.3 8.8 9.1  CREATININE 0.71 0.70 0.79 0.72 0.66  GFRNONAA 85*  --  82* 85* 87*  GFRAA >90  --  >90 >90 >90    LIVER FUNCTION TESTS:  Recent Labs  05/03/14 1910  BILITOT 1.1  AST 29  ALT 20  ALKPHOS 42  PROT 6.9  ALBUMIN 3.6    TUMOR MARKERS: No results for input(s): AFPTM, CEA, CA199,  CHROMGRNA in the last 8760 hours.  Assessment and Plan:  Previous CVA 2011 Re admitted 04/2014 for TIA sxs Work up included imaging which revealed cerebral aneurysms Now scheduled for R PCOM aneurysm embolization with Dr [name] Pt and family are aware of procedure benefits and risks including but not limited  to Infection; bleeding; vessel damage; CVA; death Agreeable to proceed Consent signed and in chart Pt is aware if embolization is performed she will be admitted to Neuro ICU overnight and plan for discharge in am      Thank you for this interesting consult.  I greatly enjoyed meeting ALBERTHA BEATTIE and look forward : Morayma Godown A 05/25/2014, 8:29 AM   I spent a total of  20 Minutes   in face to face in clinical consultation, greater than 50% of which was counseling/coordinating care for cerebral aneurysm

## 2014-05-25 NOTE — Discharge Instructions (Signed)
Arteriogram Care After These instructions give you information on caring for yourself after your procedure. Your doctor may also give you more specific instructions. Call your doctor if you have any problems or questions after your procedure. HOME CARE  Keep your leg straight for at least 6 hours.  Do not bathe, swim, or use a hot tub until directed by your doctor. You can shower.  Do not lift anything heavier than 10 pounds (about a gallon of milk) for 2 days.  Do not walk a lot, run, or drive for 2 days.  Return to normal activities in 2 days or as told by your doctor. Finding out the results of your test Ask when your test results will be ready. Make sure you get your test results. GET HELP RIGHT AWAY IF:   You have fever.  You have more pain in your leg.  The leg that was cut is:  Bleeding.  Puffy (swollen) or red.  Cold.  Pale or changes color.  Weak.  Tingly or numb. If you go to the Emergency Room, tell your nurse that you have had an arteriogram. Take this paper with you to show the nurse. MAKE SURE YOU:  Understand these instructions.  Will watch your condition.  Will get help right away if you are not doing well or get worse. Document Released: 04/07/2008 Document Revised: 01/14/2013 Document Reviewed: 04/07/2008 [facility] Patient Information 2015 [employer], [place]. This information is not intended to replace advice given to you by your health care provider. Make sure you discuss any questions you have with your health care provider.  Angiogram An angiogram, also called angiography, is a procedure used to look at the blood vessels that carry blood to different parts of your body (arteries). In this procedure, dye is injected through a long, thin tube (catheter) into an artery. X-rays are then taken. The X-rays will show if there is a blockage or problem in a blood vessel.  LET [facility] CARE PROVIDER KNOW ABOUT:  Any allergies you have, including allergies  to shellfish or contrast dye.   All medicines you are taking, including vitamins, herbs, eye drops, creams, and over-the-counter medicines.   Previous problems you or members of your family have had with the use of anesthetics.   Any blood disorders you have.   Previous surgeries you have had.  Any previous kidney problems or failure you have had.  Medical conditions you have.   Possibility of pregnancy, if this applies. RISKS AND COMPLICATIONS Generally, an angiogram is a safe procedure. However, as with any procedure, problems can occur. Possible problems include:  Injury to the blood vessels, including rupture or bleeding.  Infection or bruising at the catheter site.  Allergic reaction to the dye or contrast used.  Kidney damage from the dye or contrast used.  Blood clots that can lead to a stroke or heart attack. BEFORE THE PROCEDURE  Do not eat or drink after midnight on the night before the procedure, or as directed by your health care provider.   Ask your health care provider if you may drink enough water to take any needed medicines the morning of the procedure.  PROCEDURE  You may be given a medicine to help you relax (sedative) before and during the procedure. This medicine is given through an IV access tube that is inserted into one of your veins.   The area where the catheter will be inserted will be washed and shaved. This is usually done in the groin  but may be done in the fold of your arm (near your elbow) or in the wrist.  A medicine will be given to numb the area where the catheter will be inserted (local anesthetic).  The catheter will be inserted with a guide wire into an artery. The catheter is guided by using a type of X-ray (fluoroscopy) to the blood vessel being examined.   Dye is then injected into the catheter, and X-rays are taken. The dye helps to show where any narrowing or blockages are located.  AFTER THE PROCEDURE   If the  procedure is done through the leg, you will be kept in bed lying flat for several hours. You will be instructed to not bend or cross your legs.  The insertion site will be checked frequently.  The pulse in your feet or wrist will be checked frequently.  Additional blood tests, X-rays, and electrocardiography may be done.   You may need to stay in the hospital overnight for observation.  Document Released: 10/19/2004 Document Revised: 01/14/2013 Document Reviewed: 06/12/2012 [facility] Patient Information 2015 [place], [place]. This information is not intended to replace advice given to you by your health care provider. Make sure you discuss any questions you have with your health care provider.

## 2014-05-26 ENCOUNTER — Encounter (HOSPITAL_COMMUNITY): Payer: Self-pay | Admitting: Interventional Radiology

## 2014-05-26 ENCOUNTER — Other Ambulatory Visit: Payer: Self-pay | Admitting: Cardiology

## 2014-05-27 ENCOUNTER — Other Ambulatory Visit: Payer: Self-pay

## 2014-05-27 MED ORDER — ATORVASTATIN CALCIUM 40 MG PO TABS: 40.0000 mg | ORAL_TABLET | Freq: Every day | ORAL | Status: DC

## 2014-05-27 MED ORDER — LISINOPRIL-HYDROCHLOROTHIAZIDE 20-12.5 MG PO TABS: 1.0000 | ORAL_TABLET | Freq: Every day | ORAL | Status: DC

## 2014-06-04 ENCOUNTER — Other Ambulatory Visit (HOSPITAL_COMMUNITY): Payer: Self-pay | Admitting: Interventional Radiology

## 2014-06-04 DIAGNOSIS — I729 Aneurysm of unspecified site: Secondary | ICD-10-CM

## 2014-06-08 ENCOUNTER — Ambulatory Visit: Payer: Medicare Other | Admitting: Cardiology

## 2014-06-11 ENCOUNTER — Ambulatory Visit (HOSPITAL_COMMUNITY)
Admission: RE | Admit: 2014-06-11 | Discharge: 2014-06-11 | Disposition: A | Discharge status: Home / Self Care | Payer: Medicare Other | Source: Ambulatory Visit | Attending: Interventional Radiology | Admitting: Interventional Radiology

## 2014-06-11 DIAGNOSIS — I729 Aneurysm of unspecified site: Secondary | ICD-10-CM

## 2014-06-19 ENCOUNTER — Other Ambulatory Visit: Payer: Self-pay | Admitting: *Deleted

## 2014-06-19 ENCOUNTER — Telehealth: Payer: Self-pay | Admitting: Vascular Surgery

## 2014-06-19 DIAGNOSIS — I6523 Occlusion and stenosis of bilateral carotid arteries: Secondary | ICD-10-CM

## 2014-06-19 NOTE — Telephone Encounter (Signed)
Spoke with pt, dpm °

## 2014-06-19 NOTE — Telephone Encounter (Signed)
-----   Message from Elam Dutch, MD sent at 06/19/2014 10:35 AM EDT ----- Regarding: RE: Studies? Bilateral carotid duplex  ----- Message -----    From: Gena Fray    Sent: 06/18/2014   1:44 PM      To: Elam Dutch, MD Subject: Studies?                                       Dr Oneida Alar,  I received a call from [facility] with Dr Arlean Hopping office. She stated that you wanted to see Ms Mccaffery in the office next week (per your conversation with Dr [name]). Did you need any studies at that time?  Thanks, Hinton Dyer

## 2014-06-23 ENCOUNTER — Encounter: Payer: Self-pay | Admitting: Vascular Surgery

## 2014-06-25 ENCOUNTER — Encounter: Payer: Self-pay | Admitting: Vascular Surgery

## 2014-06-25 ENCOUNTER — Ambulatory Visit (HOSPITAL_COMMUNITY)
Admission: RE | Admit: 2014-06-25 | Discharge: 2014-06-25 | Disposition: A | Discharge status: Home / Self Care | Payer: Medicare Other | Source: Ambulatory Visit | Attending: Vascular Surgery | Admitting: Vascular Surgery

## 2014-06-25 ENCOUNTER — Ambulatory Visit (INDEPENDENT_AMBULATORY_CARE_PROVIDER_SITE_OTHER): Payer: Medicare Other | Admitting: Vascular Surgery

## 2014-06-25 VITALS — BP 136/71 | HR 51 | Ht 61.0 in | Wt 149.4 lb

## 2014-06-25 DIAGNOSIS — I6523 Occlusion and stenosis of bilateral carotid arteries: Secondary | ICD-10-CM

## 2014-06-25 DIAGNOSIS — I671 Cerebral aneurysm, nonruptured: Secondary | ICD-10-CM

## 2014-06-25 NOTE — Progress Notes (Signed)
[facility] [place] HISTORY AND PHYSICAL   History of Present Illness:  Patient is a 69 y.o. year old female who presents for evaluation of her carotid arteries. She is referred by Dr. [name] for consideration of open exposure of her common carotid artery to treat intracranial aneurysms with coiling/stenting. He has known intracranial aneurysms. Dr. Angelica Chessman is currently evaluating her for an endovascular intervention. However due to tortuosities and shape of her aortic arch a femoral approach is not possible. He has asked me to see the patient for possibility of exposing the common carotid artery for exposure for his intervention. Patient has had a prior stroke thought to be related to her aneurysms. She has some residual left arm weakness. Other residual effect is a sensation of a sandpaper type feel to her left hand. 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 She has had no other neck operations. She is currently on Plavix and aspirin. Other medical problems include has CVA; STRESS FRACTURE, FOOT; TIA (transient ischemic attack); Hypertension; Ventricular hypertrophy; Tobacco abuse; Chronic ischemic heart disease; CVA (cerebral infarction); Aneurysm; Expressive aphasia; Saccular aneurysm; Aphasia; Palmar erythema; and Brain aneurysm on her problem list.  Past Medical History  Diagnosis Date  . Ventricular hypertrophy 04/2009    LVH with diastolic dysfunction by echo. Has normal EF.  Marland Kitchen Pancreatitis     x2  . PAC (premature atrial contraction)   . TIA (transient ischemic attack)     She was hospitalized 04-23-09 through 04-27-09 for involving right side of the body  . 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 Hypertension   . Tobacco abuse     Ongoing   . Edema of foot     She has a history of chronic edema of the  left dated back to age 33 when she suufered severe frostbite playing  on the snow as a child  . Coronary artery disease 1996    Known with prior mild lesion of LAD demonstrated by Cardiac Catheterization in 1996  . 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  Marland Kitchen COPD (chronic obstructive pulmonary disease)     Past Surgical History  Procedure Laterality Date  . 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 Neck surgery  50 yrs ago    Left side  . US echocardiography  04-26-2009    EF 65-70%  . Cardiovascular stress test  12-02-2001    EF 70%  . Cardiac catheterization  1996    Mild CAD with vasospasm  . Abdominal hysterectomy    . Laparoscopy    . Radiology with anesthesia N/A 05/25/2014    Procedure: RADIOLOGY WITH ANESTHESIA;  Surgeon: [name], MD;  Location: [facility];  Service: Radiology;  Laterality: N/A;    Social History History  Substance Use Topics  . Smoking status: Former Smoker -- 1.00 packs/day for 30 years    Types: Cigarettes    Quit date: 04/26/2014  . Smokeless tobacco: Never Used  . Alcohol Use: No    Family History Family History  Problem Relation Age of Onset  . Emphysema Father   . Aneurysm Sister   . Stroke Mother   .  Hypertension Mother   . Hypertension Daughter   . Thyroid disease Daughter     Allergies  Allergies  Allergen Reactions  . Dilaudid [Hydromorphone] Swelling  . Codeine Nausea And Vomiting  . Hydromorphone Hcl Swelling  . Sulfa Drugs Cross Reactors Other (See Comments)    Unknown childhood reaction     Current Outpatient Prescriptions  Medication Sig Dispense Refill  . albuterol (PROVENTIL HFA;VENTOLIN HFA) 108 (90 BASE) MCG/ACT inhaler Inhale 1-2 puffs into the lungs every 6 (six) hours as needed for wheezing or shortness of breath.    Marland Kitchen aspirin EC 81 MG tablet Take 81 mg by mouth daily.    Marland Kitchen atorvastatin (LIPITOR) 40 MG tablet Take 1  tablet (40 mg total) by mouth daily at 6 PM. 30 tablet 3  . clopidogrel (PLAVIX) 75 MG tablet TAKE 1 TABLET BY MOUTH DAILY. 30 tablet 5  . lisinopril-hydrochlorothiazide (PRINZIDE,ZESTORETIC) 20-12.5 MG per tablet Take 1 tablet by mouth daily. 30 tablet 3  . TOPROL XL 50 MG 24 hr tablet TAKE 1/2 TABLET BY MOUTH TWICE DAILY. 30 tablet 3  . mometasone-formoterol (DULERA) 100-5 MCG/ACT AERO Inhale 2 puffs into the lungs 2 (two) times daily. (Patient not taking: Reported on 05/15/2014) 1 Inhaler 0   No current facility-administered medications for this visit.    ROS:   General:  No weight loss, Fever, chills  HEENT: No recent headaches, no nasal bleeding, no visual changes, no sore throat  Neurologic: No dizziness, blackouts, seizures. + recent symptoms of stroke or mini- stroke. No recent episodes of slurred speech, or temporary blindness.  Cardiac: No recent episodes of chest pain/pressure, no shortness of breath at rest.  No shortness of breath with exertion.  Denies history of atrial fibrillation or irregular heartbeat  Vascular: No history of rest pain in feet.  No history of claudication.  No history of non-healing ulcer, No history of DVT   Pulmonary: No home oxygen, no productive cough, no hemoptysis,  No asthma or wheezing  Musculoskeletal:  [ ]  Arthritis, [ ]  Low back pain,  [ ]  Joint pain  Hematologic:No history of hypercoagulable state.  No history of easy bleeding.  No history of anemia  Gastrointestinal: No hematochezia or melena,  No gastroesophageal reflux, no trouble swallowing  Urinary: [ ]  chronic Kidney disease, [ ]  on HD - [ ]  MWF or [ ]  TTHS, [ ]  Burning with urination, [ ]  Frequent urination, [ ]  Difficulty urinating;   Skin: No rashes  Psychological: No history of anxiety,  No history of depression   Physical Examination  Filed Vitals:   06/25/14 1139  BP: 136/71  Pulse: 51  Height: 5\' 1"  (1.549 m)  Weight: 67.767 kg (149 lb 6.4 oz)  SpO2: 98%    Body  mass index is 28.24 kg/(m^2).  General:  Alert and oriented, no acute distress HEENT: Normal Neck: No bruit or JVD Pulmonary: Clear to auscultation bilaterally Cardiac: Regular Rate and Rhythm without murmur Abdomen: Soft, non-tender, non-distended, no mass Skin: No rash Extremity Pulses:  2+ radial, brachial, femoral, dorsalis pedis, posterior tibial pulses bilaterally Musculoskeletal: No deformity or edema  Neurologic: Upper and lower extremity motor 5/5 and symmetric, subtle left hand intrinsic weakness  DATA:  Carotid duplex scan was performed today which showed less than 50% stenosis bilaterally. I reviewed and interpreted this study.   ASSESSMENT:  I discussed with the patient and her family today that I would be involved in her procedure with exposing the common carotid artery. We  would do staged common carotid exposures depending on which side Dr. [name] decided to intervene on first. I procedures included but not limited to bleeding infection cranial nerve injury remote risk of stroke but I did emphasize to her that there would be risk of stroke with Dr. Enriqueta Shutter procedure. She and her family had some concerns about transportation after the carotid had been exposed from the main operating room on the second floor down to the first floor radiology suite.   PLAN:  The patient will contact Dr. [name] if she wishes to proceed with the procedure. If she wishes to proceed we will coordinate with Dr. Arlean Hopping schedule.  Ruta Hinds, MD [facility] Office: [phone] Pager: [phone]

## 2014-06-29 ENCOUNTER — Telehealth: Payer: Self-pay | Admitting: *Deleted

## 2014-06-29 ENCOUNTER — Telehealth: Payer: Self-pay | Admitting: Cardiology

## 2014-06-29 NOTE — Telephone Encounter (Signed)
Patient called asking about the bruising on her arms.  She states that she is currently taking aspirin and plavix.  She is concerned that her blood is thinning too much.  I explained that Dr Oneida Alar is assisting Dr [name] therefore she should contact their office.  The patient does not have her surgery scheduled yet as our scheduler is waiting on a return call from [place] at Dr Arlean Hopping office to call her back.  Patient voiced understanding and was going to contact Dr [name].

## 2014-06-29 NOTE — Telephone Encounter (Signed)
Patient having lots of bruising and is having upcoming procedure Nurse out to see her today very concerned about the bruising Discussed with  Dr. Mare Ferrari and she needs to discuss with her Neurologist since he is the one who prescribed  Advised patient

## 2014-06-29 NOTE — Telephone Encounter (Signed)
Pt c/o medication issue:  1. Name of Medication: Plavix    4. What is your medication issue? Pt states that she has several bruises for plavix she states that she is also getting ready for surgery, so she believes that she may have to come off of the Plavix. She reports that she has bruises around waist and arms. (Informed pt to have Surgical clearance called in from surgeons office and that I would still send the message to document bruising)

## 2014-07-01 ENCOUNTER — Other Ambulatory Visit: Payer: Self-pay | Admitting: Cardiology

## 2014-07-10 ENCOUNTER — Telehealth (HOSPITAL_COMMUNITY): Payer: Self-pay

## 2014-07-15 ENCOUNTER — Telehealth (HOSPITAL_COMMUNITY): Payer: Self-pay | Admitting: Interventional Radiology

## 2014-07-15 ENCOUNTER — Other Ambulatory Visit (HOSPITAL_COMMUNITY): Payer: Self-pay | Admitting: Interventional Radiology

## 2014-07-15 DIAGNOSIS — I671 Cerebral aneurysm, nonruptured: Secondary | ICD-10-CM

## 2014-07-15 NOTE — Telephone Encounter (Signed)
Called and spoke to patient concerning her upcoming intervention. She states understanding and is in agreement with the upcoming plan. She did mention that she feels a tightness in her chest as though she is not able to get good intake of airflow. She states that she has had this feeling off and on since her last stroke. She also stated that she has to sleep on 3 pillows in an upright position at night to feel as though she is getting enough air in. I told her that I would relay this information to anesthesia so that this can be addressed when she comes in for her pre-op appointment. She was appreciative of that. I will call anesthesia and make them aware of this today )07/15/14). Mailed written appointment instructions to the patient also today (07/15/14). JM

## 2014-07-30 ENCOUNTER — Other Ambulatory Visit: Payer: Self-pay | Admitting: Radiology

## 2014-07-31 ENCOUNTER — Other Ambulatory Visit: Payer: Self-pay | Admitting: *Deleted

## 2014-08-07 ENCOUNTER — Encounter (HOSPITAL_COMMUNITY): Payer: Self-pay

## 2014-08-07 ENCOUNTER — Encounter (HOSPITAL_COMMUNITY)
Admission: RE | Admit: 2014-08-07 | Discharge: 2014-08-07 | Disposition: A | Discharge status: Home / Self Care | Payer: Medicare Other | Source: Ambulatory Visit | Attending: Interventional Radiology | Admitting: Interventional Radiology

## 2014-08-07 DIAGNOSIS — I671 Cerebral aneurysm, nonruptured: Secondary | ICD-10-CM | POA: Diagnosis not present

## 2014-08-07 DIAGNOSIS — Z01818 Encounter for other preprocedural examination: Secondary | ICD-10-CM | POA: Insufficient documentation

## 2014-08-07 HISTORY — DX: Nausea with vomiting, unspecified: R11.2

## 2014-08-07 HISTORY — DX: Reserved for inherently not codable concepts without codable children: IMO0001

## 2014-08-07 HISTORY — DX: Other specified postprocedural states: Z98.890

## 2014-08-07 LAB — CBC WITH DIFFERENTIAL/PLATELET
BASOS ABS: 0 10*3/uL (ref 0.0–0.1)
BASOS PCT: 1 % (ref 0–1)
EOS PCT: 2 % (ref 0–5)
Eosinophils Absolute: 0.1 10*3/uL (ref 0.0–0.7)
HCT: 36.9 % (ref 36.0–46.0)
HEMOGLOBIN: 12.6 g/dL (ref 12.0–15.0)
LYMPHS ABS: 1.3 10*3/uL (ref 0.7–4.0)
Lymphocytes Relative: 31 % (ref 12–46)
MCH: 28.8 pg (ref 26.0–34.0)
MCHC: 34.1 g/dL (ref 30.0–36.0)
MCV: 84.2 fL (ref 78.0–100.0)
Monocytes Absolute: 0.3 10*3/uL (ref 0.1–1.0)
Monocytes Relative: 6 % (ref 3–12)
NEUTROS ABS: 2.6 10*3/uL (ref 1.7–7.7)
Neutrophils Relative %: 60 % (ref 43–77)
Platelets: 128 10*3/uL — ABNORMAL LOW (ref 150–400)
RBC: 4.38 MIL/uL (ref 3.87–5.11)
RDW: 13.6 % (ref 11.5–15.5)
WBC: 4.3 10*3/uL (ref 4.0–10.5)

## 2014-08-07 LAB — COMPREHENSIVE METABOLIC PANEL
ALT: 17 U/L (ref 14–54)
AST: 20 U/L (ref 15–41)
Albumin: 3.5 g/dL (ref 3.5–5.0)
Alkaline Phosphatase: 40 U/L (ref 38–126)
Anion gap: 8 (ref 5–15)
BUN: 15 mg/dL (ref 6–20)
CALCIUM: 8.9 mg/dL (ref 8.9–10.3)
CHLORIDE: 104 mmol/L (ref 101–111)
CO2: 26 mmol/L (ref 22–32)
CREATININE: 0.77 mg/dL (ref 0.44–1.00)
GFR calc Af Amer: >60 mL/min (ref >60)
GFR calc non Af Amer: >60 mL/min (ref >60)
Glucose, Bld: 102 mg/dL — ABNORMAL HIGH (ref 65–99)
Potassium: 4.2 mmol/L (ref 3.5–5.1)
Sodium: 138 mmol/L (ref 135–145)
Total Bilirubin: 0.6 mg/dL (ref 0.3–1.2)
Total Protein: 6.6 g/dL (ref 6.5–8.1)

## 2014-08-07 LAB — SURGICAL PCR SCREEN
MRSA, PCR: NEGATIVE
Staphylococcus aureus: NEGATIVE

## 2014-08-07 LAB — PROTIME-INR
INR: 1.01 (ref 0.00–1.49)
Prothrombin Time: 13.5 seconds (ref 11.6–15.2)

## 2014-08-07 LAB — APTT: APTT: 29 s (ref 24–37)

## 2014-08-07 LAB — PLATELET INHIBITION P2Y12: Platelet Function  P2Y12: 55 [PRU] — ABNORMAL LOW (ref 194–418)

## 2014-08-07 NOTE — Pre-Procedure Instructions (Addendum)
Jeanne Lawson  08/07/2014      [facility] - [place] - [address] [place] [address] Phone: [phone] Fax: [phone]  [place], [place] [address] Phone: [phone] Fax: [phone]    Your procedure is scheduled on 08/12/14.  Report to [facility] short stay admitting at 630 A.M.  Call this number if you have problems the morning of surgery:  [phone]   Remember:  Do not eat food or drink liquids after midnight.  Take these medicines the morning of surgery with A SIP OF WATER inhaler if needed (bring with you), toprol     Aspirin, plavix per dr    Bridgette Habermann all herbel meds, nsaids (aleve,naproxen,advil,ibuprofen) starting  Now including vitamins   Do not wear jewelry, make-up or nail polish.  Do not wear lotions, powders, or perfumes.  You may wear deodorant.  Do not shave 48 hours prior to surgery.  Men may shave face and neck.  Do not bring valuables to the hospital.  [facility] is not responsible for any belongings or valuables.  Contacts, dentures or bridgework may not be worn into surgery.  Leave your suitcase in the car.  After surgery it may be brought to your room.  For patients admitted to the hospital, discharge time will be determined by your treatment team.  Patients discharged the day of surgery will not be allowed to drive home.   Name and phone number of your driver:   Special instructions:   Special Instructions: [facility] - Preparing for Surgery  Before surgery, you can play an important role.  Because skin is not sterile, your skin needs to be as free of germs as possible.  You can reduce the number of germs on you skin by washing with CHG (chlorahexidine gluconate) soap before surgery.  CHG is an antiseptic cleaner which kills germs and bonds with the skin to continue killing germs even after  washing.  Please DO NOT use if you have an allergy to CHG or antibacterial soaps.  If your skin becomes reddened/irritated stop using the CHG and inform your nurse when you arrive at Short Stay.  Do not shave (including legs and underarms) for at least 48 hours prior to the first CHG shower.  You may shave your face.  Please follow these instructions carefully:   1.  Shower with CHG Soap the night before surgery and the morning of Surgery.  2.  If you choose to wash your hair, wash your hair first as usual with your normal shampoo.  3.  After you shampoo, rinse your hair and body thoroughly to remove the Shampoo.  4.  Use CHG as you would any other liquid soap.  You can apply chg directly  to the skin and wash gently with scrungie or a clean washcloth.  5.  Apply the CHG Soap to your body ONLY FROM THE NECK DOWN.  Do not use on open wounds or open sores.  Avoid contact with your eyes ears, mouth and genitals (private parts).  Wash genitals (private parts)       with your normal soap.  6.  Wash thoroughly, paying special attention to the area where your surgery will be performed.  7.  Thoroughly rinse your body with warm water from the neck down.  8.  DO NOT shower/wash with your normal soap  after using and rinsing off the CHG Soap.  9.  Pat yourself dry with a clean towel.            10.  Wear clean pajamas.            11.  Place clean sheets on your bed the night of your first shower and do not sleep with pets.  Day of Surgery  Do not apply any lotions/deodorants the morning of surgery.  Please wear clean clothes to the hospital/surgery center.  Please read over the following fact sheets that you were given. Pain Booklet, Coughing and Deep Breathing, MRSA Information and Surgical Site Infection Prevention

## 2014-08-07 NOTE — Progress Notes (Signed)
Anesthesia PAT Evaluation:  Patient is a 69 year old female scheduled for 1) open exposure of CCA (Dr. Oneida Alar) and 2) endovascular treatment of RICA posterior communicating region aneurysm (Dr. [name]). She also has a LICA terminus aneurysm. Procedure is scheduled for 08/12/14. Dr. [name] initially tried to treat her aneurysms via a transfemoral approach on 05/25/14 but the procedure was aborted due to severe tortuosity of the morphology of the aortic arch and proximal RCCA and the origin of the innominate artery.     Other history includes former smoker (quit 04/26/14), post-operative N/V, pancreatitis, PACs/PAF, LVH with diastolic dysfunction (no LVH, grade 1 diastolic dysfunction 07/6281 echo), reportedly "mild" LAD disease by '96 cath, COPD, HTN, (non-hemorrhagic) CVA with left sided weakness 10/2008 and 04/2014. She developed hoarseness and would have intermittent periods of SOB after her 04/2014 CVA.  Reportedly she was told that these symptoms could be related to her CVA and should improve over time.  She reports they have overall. She uses inhaler approximately three times a week if she feels like she is SOB, although she has not noticed any wheezing.  No chest pain. She sleeps on three pillow, but has done this for years. She walks with a cane. She has chronic BLE edema (LLE since childhood after sustaining frostbite at age 50 and RLE following her 88 CVA).   PCP is Dr. [name]. Cardiologist is Dr. Mare Ferrari. He last saw patient on 05/18/14 and felt patient was okay to proceed with neuro IR intervention. According to his note, "She has a history of prior coronary artery disease and 16 years ago Dr. Ilda Foil did cardiac catheterization and angioplasty but no stent. We don't have those records. The patient has had several subsequent Cardiolite stress tests which have been normal but have been poorly tolerated by the patient and she does not want any further nuclear stress tests. She does have a  chronically abnormal EKG with anterolateral T wave inversion. Since last visit she's been doing well with no new cardiac symptoms."  Meds include albuterol, Lipitor, ASA, Plavix, Zestoretic, Toprol.   05/02/14 EKG: SR, lateral T wave abnormality, consider ischemia. Lateral T wave abnormality also present on previous EKG from 11/18/13.  05/04/14 Echo: Study Conclusions - Left ventricle: The cavity size was normal. Wall thickness was normal. Systolic function was normal. The estimated ejection fraction was in the range of 55% to 60%. Doppler parameters are consistent with abnormal left ventricular relaxation (grade 1 diastolic dysfunction). - Mitral valve: Calcified annulus.  06/25/14 Carotid duplex: 1-49% BICA stenosis, lower end of range.  05/02/14 CXR:  IMPRESSION: 1. Stable cardiomegaly without failure. 2. Emphysema.  Preoperative labs noted.   Exam shows a pleasant Caucasian female in NAD. She is sitting in a hospital wheelchair. No conversational dyspnea. Lungs are clear throughout. Heart RRR, rate ~ 60 bpm. Bilateral pedal edema ~ 2+, L > R.   If no acute changes then I anticipate that she can proceed as planned.  [facility] Short Stay Center/Anesthesiology Phone [phone] 08/07/2014 5:23 PM

## 2014-08-11 ENCOUNTER — Telehealth (HOSPITAL_COMMUNITY): Payer: Self-pay | Admitting: Interventional Radiology

## 2014-08-11 ENCOUNTER — Other Ambulatory Visit: Payer: Self-pay | Admitting: Physician Assistant

## 2014-08-11 MED ORDER — NIMODIPINE 30 MG PO CAPS
0.0000 mg | ORAL_CAPSULE | ORAL | Status: DC
  Filled 2014-08-11: qty 2

## 2014-08-11 MED ORDER — CLOPIDOGREL BISULFATE 75 MG PO TABS
75.0000 mg | ORAL_TABLET | ORAL | Status: DC
  Filled 2014-08-11: qty 1

## 2014-08-11 MED ORDER — SODIUM CHLORIDE 0.9 % IV SOLN: INTRAVENOUS | Status: DC

## 2014-08-11 MED ORDER — CEFAZOLIN SODIUM-DEXTROSE 2-3 GM-% IV SOLR
2.0000 g | INTRAVENOUS | Status: AC
  Administered 2014-08-12: 2 g via INTRAVENOUS
  Filled 2014-08-11 (×2): qty 50

## 2014-08-11 MED ORDER — SODIUM CHLORIDE 0.9 % IV SOLN: Freq: Once | INTRAVENOUS | Status: DC

## 2014-08-11 MED ORDER — ASPIRIN EC 325 MG PO TBEC
325.0000 mg | DELAYED_RELEASE_TABLET | Freq: Once | ORAL | Status: AC
  Administered 2014-08-12: 325 mg via ORAL
  Filled 2014-08-11 (×2): qty 1

## 2014-08-11 MED ORDER — SODIUM CHLORIDE 0.9 % IV SOLN
INTRAVENOUS | Status: DC
  Administered 2014-08-12 (×3): via INTRAVENOUS

## 2014-08-11 NOTE — Telephone Encounter (Signed)
Received a call from Jeneen Rinks with [facility] asking if [name] would be staying in the hospital for two nights after she has her intervention tomorrow. I told him that I would speak with Deveshwar to confirm this and call him back. I did speak with Deveshwar who confirmed that Ms. Wernick will come in as an outpatient/in bed but once her procedure is complete she will then be admitted for a two night stay. He states agreement and understanding of this. I am also confirming with the patient's insurance company that she does not need any authorization for admission following her procedure. JM

## 2014-08-12 ENCOUNTER — Inpatient Hospital Stay (HOSPITAL_COMMUNITY): Payer: Medicare Other

## 2014-08-12 ENCOUNTER — Ambulatory Visit (HOSPITAL_COMMUNITY): Payer: Medicare Other | Admitting: Certified Registered Nurse Anesthetist

## 2014-08-12 ENCOUNTER — Encounter (HOSPITAL_COMMUNITY): Payer: Self-pay

## 2014-08-12 ENCOUNTER — Encounter (HOSPITAL_COMMUNITY)
Admission: AD | Disposition: A | Discharge status: Skilled Nursing Facility | Payer: Self-pay | Source: Ambulatory Visit | Attending: Interventional Radiology

## 2014-08-12 ENCOUNTER — Ambulatory Visit (HOSPITAL_COMMUNITY): Payer: Medicare Other | Admitting: Vascular Surgery

## 2014-08-12 ENCOUNTER — Inpatient Hospital Stay (HOSPITAL_COMMUNITY)
Admission: AD | Admit: 2014-08-12 | Discharge: 2014-08-17 | DRG: 026 | Disposition: A | Discharge status: Skilled Nursing Facility | Payer: Medicare Other | Source: Ambulatory Visit | Attending: Interventional Radiology | Admitting: Interventional Radiology

## 2014-08-12 ENCOUNTER — Encounter (HOSPITAL_COMMUNITY): Payer: Self-pay | Admitting: Anesthesiology

## 2014-08-12 ENCOUNTER — Encounter (HOSPITAL_COMMUNITY): Payer: Self-pay | Admitting: *Deleted

## 2014-08-12 ENCOUNTER — Ambulatory Visit (HOSPITAL_COMMUNITY)
Admission: RE | Admit: 2014-08-12 | Discharge: 2014-08-12 | Disposition: A | Discharge status: Home / Self Care | Payer: Medicare Other | Source: Ambulatory Visit | Attending: Interventional Radiology | Admitting: Interventional Radiology

## 2014-08-12 DIAGNOSIS — Z87891 Personal history of nicotine dependence: Secondary | ICD-10-CM

## 2014-08-12 DIAGNOSIS — I671 Cerebral aneurysm, nonruptured: Secondary | ICD-10-CM

## 2014-08-12 DIAGNOSIS — R490 Dysphonia: Secondary | ICD-10-CM | POA: Diagnosis present

## 2014-08-12 DIAGNOSIS — E876 Hypokalemia: Secondary | ICD-10-CM | POA: Diagnosis present

## 2014-08-12 DIAGNOSIS — Z9289 Personal history of other medical treatment: Secondary | ICD-10-CM

## 2014-08-12 DIAGNOSIS — Z8673 Personal history of transient ischemic attack (TIA), and cerebral infarction without residual deficits: Secondary | ICD-10-CM

## 2014-08-12 DIAGNOSIS — I728 Aneurysm of other specified arteries: Secondary | ICD-10-CM

## 2014-08-12 DIAGNOSIS — R531 Weakness: Secondary | ICD-10-CM | POA: Diagnosis present

## 2014-08-12 DIAGNOSIS — Q248 Other specified congenital malformations of heart: Secondary | ICD-10-CM | POA: Diagnosis not present

## 2014-08-12 DIAGNOSIS — L7622 Postprocedural hemorrhage and hematoma of skin and subcutaneous tissue following other procedure: Secondary | ICD-10-CM | POA: Diagnosis not present

## 2014-08-12 DIAGNOSIS — Z4659 Encounter for fitting and adjustment of other gastrointestinal appliance and device: Secondary | ICD-10-CM

## 2014-08-12 DIAGNOSIS — R34 Anuria and oliguria: Secondary | ICD-10-CM | POA: Diagnosis not present

## 2014-08-12 DIAGNOSIS — J449 Chronic obstructive pulmonary disease, unspecified: Secondary | ICD-10-CM | POA: Diagnosis present

## 2014-08-12 DIAGNOSIS — J9601 Acute respiratory failure with hypoxia: Secondary | ICD-10-CM | POA: Diagnosis not present

## 2014-08-12 DIAGNOSIS — I2510 Atherosclerotic heart disease of native coronary artery without angina pectoris: Secondary | ICD-10-CM | POA: Diagnosis present

## 2014-08-12 DIAGNOSIS — J9690 Respiratory failure, unspecified, unspecified whether with hypoxia or hypercapnia: Secondary | ICD-10-CM | POA: Insufficient documentation

## 2014-08-12 DIAGNOSIS — I959 Hypotension, unspecified: Secondary | ICD-10-CM

## 2014-08-12 DIAGNOSIS — J9600 Acute respiratory failure, unspecified whether with hypoxia or hypercapnia: Secondary | ICD-10-CM | POA: Diagnosis not present

## 2014-08-12 DIAGNOSIS — I119 Hypertensive heart disease without heart failure: Secondary | ICD-10-CM | POA: Diagnosis present

## 2014-08-12 HISTORY — PX: RADIOLOGY WITH ANESTHESIA: SHX6223

## 2014-08-12 HISTORY — PX: ENDARTERECTOMY: SHX5162

## 2014-08-12 LAB — CBC
HCT: 33.1 % — ABNORMAL LOW (ref 36.0–46.0)
Hemoglobin: 11.2 g/dL — ABNORMAL LOW (ref 12.0–15.0)
MCH: 28.5 pg (ref 26.0–34.0)
MCHC: 33.8 g/dL (ref 30.0–36.0)
MCV: 84.2 fL (ref 78.0–100.0)
Platelets: 106 10*3/uL — ABNORMAL LOW (ref 150–400)
RBC: 3.93 MIL/uL (ref 3.87–5.11)
RDW: 13.6 % (ref 11.5–15.5)
WBC: 4.6 10*3/uL (ref 4.0–10.5)

## 2014-08-12 LAB — PREPARE RBC (CROSSMATCH)

## 2014-08-12 LAB — APTT
aPTT: >200 seconds (ref 24–37)
aPTT: 34 seconds (ref 24–37)

## 2014-08-12 LAB — BLOOD GAS, ARTERIAL
ACID-BASE DEFICIT: 2.6 mmol/L — AB (ref 0.0–2.0)
BICARBONATE: 22.4 meq/L (ref 20.0–24.0)
Drawn by: 44137
FIO2: 0.4 %
LHR: 14 {breaths}/min
O2 Saturation: 97.8 %
PEEP: 5 cmH2O
PO2 ART: 118 mmHg — AB (ref 80.0–100.0)
Patient temperature: 98.6
TCO2: 23.8 mmol/L (ref 0–100)
VT: 380 mL
pCO2 arterial: 43.7 mmHg (ref 35.0–45.0)
pH, Arterial: 7.331 — ABNORMAL LOW (ref 7.350–7.450)

## 2014-08-12 LAB — POCT ACTIVATED CLOTTING TIME
ACTIVATED CLOTTING TIME: 196 s
ACTIVATED CLOTTING TIME: 220 s
ACTIVATED CLOTTING TIME: 239 s
Activated Clotting Time: 220 seconds
Activated Clotting Time: 220 seconds

## 2014-08-12 LAB — PLATELET INHIBITION P2Y12: PLATELET FUNCTION P2Y12: 60 [PRU] — AB (ref 194–418)

## 2014-08-12 LAB — PROTIME-INR
INR: 1.21 (ref 0.00–1.49)
PROTHROMBIN TIME: 15.5 s — AB (ref 11.6–15.2)

## 2014-08-12 LAB — ABO/RH: ABO/RH(D): A POS

## 2014-08-12 SURGERY — ENDARTERECTOMY, CAROTID
Anesthesia: General | Site: Neck | Laterality: Right

## 2014-08-12 SURGERY — RADIOLOGY WITH ANESTHESIA
Anesthesia: General

## 2014-08-12 MED ORDER — DEXTROSE 5 % IV SOLN
0.0000 ug/min | INTRAVENOUS | Status: DC
  Filled 2014-08-12: qty 1

## 2014-08-12 MED ORDER — PROPOFOL 1000 MG/100ML IV EMUL
5.0000 ug/kg/min | INTRAVENOUS | Status: DC
  Administered 2014-08-13: 45 ug/kg/min via INTRAVENOUS
  Administered 2014-08-13: 35 ug/kg/min via INTRAVENOUS
  Filled 2014-08-12 (×3): qty 100

## 2014-08-12 MED ORDER — ONDANSETRON HCL 4 MG/2ML IJ SOLN
INTRAMUSCULAR | Status: AC
  Filled 2014-08-12: qty 2

## 2014-08-12 MED ORDER — HEPARIN (PORCINE) IN NACL 100-0.45 UNIT/ML-% IJ SOLN
600.0000 [IU]/h | INTRAMUSCULAR | Status: AC
  Administered 2014-08-12: 600 [IU]/h via INTRAVENOUS
  Filled 2014-08-12 (×2): qty 250

## 2014-08-12 MED ORDER — 0.9 % SODIUM CHLORIDE (POUR BTL) OPTIME
TOPICAL | Status: DC | PRN
  Administered 2014-08-12: 1000 mL

## 2014-08-12 MED ORDER — CHLORHEXIDINE GLUCONATE 4 % EX LIQD: 60.0000 mL | Freq: Once | CUTANEOUS | Status: DC

## 2014-08-12 MED ORDER — CHLORHEXIDINE GLUCONATE 0.12 % MT SOLN
15.0000 mL | Freq: Two times a day (BID) | OROMUCOSAL | Status: DC
  Administered 2014-08-12 – 2014-08-13 (×3): 15 mL via OROMUCOSAL
  Filled 2014-08-12 (×3): qty 15

## 2014-08-12 MED ORDER — DEXAMETHASONE SODIUM PHOSPHATE 4 MG/ML IJ SOLN
INTRAMUSCULAR | Status: AC
  Filled 2014-08-12: qty 1

## 2014-08-12 MED ORDER — PHENYLEPHRINE 40 MCG/ML (10ML) SYRINGE FOR IV PUSH (FOR BLOOD PRESSURE SUPPORT)
PREFILLED_SYRINGE | INTRAVENOUS | Status: AC
  Filled 2014-08-12: qty 10

## 2014-08-12 MED ORDER — NITROGLYCERIN 1 MG/10 ML FOR IR/CATH LAB
INTRA_ARTERIAL | Status: AC
  Filled 2014-08-12: qty 10

## 2014-08-12 MED ORDER — EPTIFIBATIDE 20 MG/10ML IV SOLN
INTRAVENOUS | Status: AC
  Filled 2014-08-12: qty 10

## 2014-08-12 MED ORDER — FENTANYL CITRATE (PF) 100 MCG/2ML IJ SOLN
25.0000 ug | INTRAMUSCULAR | Status: DC | PRN
  Administered 2014-08-13: 50 ug via INTRAVENOUS
  Administered 2014-08-13: 25 ug via INTRAVENOUS
  Administered 2014-08-13 (×2): 50 ug via INTRAVENOUS
  Administered 2014-08-13: 25 ug via INTRAVENOUS
  Administered 2014-08-14: 50 ug via INTRAVENOUS
  Filled 2014-08-12 (×6): qty 2

## 2014-08-12 MED ORDER — GLYCOPYRROLATE 0.2 MG/ML IJ SOLN
INTRAMUSCULAR | Status: DC | PRN
  Administered 2014-08-12 (×2): .2 mg via INTRAVENOUS

## 2014-08-12 MED ORDER — ROCURONIUM BROMIDE 100 MG/10ML IV SOLN
INTRAVENOUS | Status: DC | PRN
  Administered 2014-08-12: 30 mg via INTRAVENOUS
  Administered 2014-08-12: 20 mg via INTRAVENOUS
  Administered 2014-08-12 (×2): 50 mg via INTRAVENOUS

## 2014-08-12 MED ORDER — PROPOFOL 10 MG/ML IV BOLUS
INTRAVENOUS | Status: AC
  Filled 2014-08-12: qty 20

## 2014-08-12 MED ORDER — NICARDIPINE HCL IN NACL 20-0.86 MG/200ML-% IV SOLN
5.0000 mg/h | INTRAVENOUS | Status: DC
Start: 2014-08-12 — End: 2014-08-17

## 2014-08-12 MED ORDER — PROPOFOL 10 MG/ML IV BOLUS
INTRAVENOUS | Status: DC | PRN
  Administered 2014-08-12: 110 mg via INTRAVENOUS

## 2014-08-12 MED ORDER — FENTANYL CITRATE (PF) 100 MCG/2ML IJ SOLN
INTRAMUSCULAR | Status: DC | PRN
  Administered 2014-08-12: 100 ug via INTRAVENOUS
  Administered 2014-08-12 (×2): 50 ug via INTRAVENOUS
  Administered 2014-08-12: 100 ug via INTRAVENOUS
  Administered 2014-08-12 (×2): 50 ug via INTRAVENOUS
  Administered 2014-08-12: 100 ug via INTRAVENOUS

## 2014-08-12 MED ORDER — HEPARIN SODIUM (PORCINE) 1000 UNIT/ML IJ SOLN
INTRAMUSCULAR | Status: DC | PRN
  Administered 2014-08-12: 3000 [IU] via INTRAVENOUS

## 2014-08-12 MED ORDER — CETYLPYRIDINIUM CHLORIDE 0.05 % MT LIQD
7.0000 mL | Freq: Four times a day (QID) | OROMUCOSAL | Status: DC
  Administered 2014-08-13 – 2014-08-14 (×5): 7 mL via OROMUCOSAL

## 2014-08-12 MED ORDER — ARTIFICIAL TEARS OP OINT
TOPICAL_OINTMENT | OPHTHALMIC | Status: AC
  Filled 2014-08-12: qty 3.5

## 2014-08-12 MED ORDER — GLYCOPYRROLATE 0.2 MG/ML IJ SOLN
INTRAMUSCULAR | Status: AC
  Filled 2014-08-12: qty 1

## 2014-08-12 MED ORDER — PHENYLEPHRINE HCL 10 MG/ML IJ SOLN
10.0000 mg | INTRAVENOUS | Status: DC | PRN
  Administered 2014-08-12: 20 ug/min via INTRAVENOUS

## 2014-08-12 MED ORDER — ONDANSETRON HCL 4 MG/2ML IJ SOLN: 4.0000 mg | Freq: Once | INTRAMUSCULAR | Status: AC | PRN

## 2014-08-12 MED ORDER — ROCURONIUM BROMIDE 50 MG/5ML IV SOLN
INTRAVENOUS | Status: AC
  Filled 2014-08-12: qty 1

## 2014-08-12 MED ORDER — FENTANYL CITRATE (PF) 250 MCG/5ML IJ SOLN
INTRAMUSCULAR | Status: AC
  Filled 2014-08-12: qty 5

## 2014-08-12 MED ORDER — IOHEXOL 300 MG/ML  SOLN
150.0000 mL | Freq: Once | INTRAMUSCULAR | Status: AC | PRN
  Administered 2014-08-12: 100 mL via INTRA_ARTERIAL

## 2014-08-12 MED ORDER — SUCCINYLCHOLINE CHLORIDE 20 MG/ML IJ SOLN
INTRAMUSCULAR | Status: AC
  Filled 2014-08-12: qty 1

## 2014-08-12 MED ORDER — ONDANSETRON HCL 4 MG/2ML IJ SOLN
4.0000 mg | Freq: Four times a day (QID) | INTRAMUSCULAR | Status: DC | PRN
  Administered 2014-08-14: 4 mg via INTRAVENOUS
  Filled 2014-08-12: qty 2

## 2014-08-12 MED ORDER — PROPOFOL INFUSION 10 MG/ML OPTIME
INTRAVENOUS | Status: DC | PRN
  Administered 2014-08-12 (×2): 50 ug/kg/min via INTRAVENOUS

## 2014-08-12 MED ORDER — ASPIRIN 325 MG PO TABS
325.0000 mg | ORAL_TABLET | Freq: Every day | ORAL | Status: DC
  Administered 2014-08-13 – 2014-08-17 (×5): 325 mg via ORAL
  Filled 2014-08-12 (×7): qty 1

## 2014-08-12 MED ORDER — SODIUM CHLORIDE 0.9 % IV SOLN
INTRAVENOUS | Status: DC
  Administered 2014-08-12 – 2014-08-15 (×5): via INTRAVENOUS
  Administered 2014-08-16: 75 mL/h via INTRAVENOUS

## 2014-08-12 MED ORDER — LIDOCAINE HCL (CARDIAC) 20 MG/ML IV SOLN
INTRAVENOUS | Status: AC
Start: 2014-08-12 — End: 2014-08-12
  Filled 2014-08-12: qty 5

## 2014-08-12 MED ORDER — LIDOCAINE HCL 1 % IJ SOLN
INTRAMUSCULAR | Status: AC
  Filled 2014-08-12: qty 20

## 2014-08-12 MED ORDER — CLOPIDOGREL BISULFATE 75 MG PO TABS
75.0000 mg | ORAL_TABLET | Freq: Every day | ORAL | Status: DC
  Administered 2014-08-13 – 2014-08-17 (×5): 75 mg via ORAL
  Filled 2014-08-12 (×6): qty 1

## 2014-08-12 MED ORDER — PANTOPRAZOLE SODIUM 40 MG IV SOLR
40.0000 mg | INTRAVENOUS | Status: DC
  Administered 2014-08-13 – 2014-08-15 (×4): 40 mg via INTRAVENOUS
  Filled 2014-08-12 (×4): qty 40

## 2014-08-12 MED ORDER — ARTIFICIAL TEARS OP OINT
TOPICAL_OINTMENT | OPHTHALMIC | Status: DC | PRN
  Administered 2014-08-12: 1 via OPHTHALMIC

## 2014-08-12 MED ORDER — MIDAZOLAM HCL 2 MG/2ML IJ SOLN: INTRAMUSCULAR | Status: DC | PRN

## 2014-08-12 MED ORDER — LIDOCAINE HCL (CARDIAC) 20 MG/ML IV SOLN
INTRAVENOUS | Status: DC | PRN
  Administered 2014-08-12: 40 mg via INTRAVENOUS

## 2014-08-12 MED ORDER — PHENYLEPHRINE HCL 10 MG/ML IJ SOLN
INTRAMUSCULAR | Status: DC | PRN
  Administered 2014-08-12 (×6): 40 ug via INTRAVENOUS

## 2014-08-12 MED ORDER — SODIUM CHLORIDE 0.9 % IR SOLN
Status: DC | PRN
  Administered 2014-08-12: 500 mL

## 2014-08-12 MED ORDER — DEXAMETHASONE SODIUM PHOSPHATE 4 MG/ML IJ SOLN
INTRAMUSCULAR | Status: DC | PRN
  Administered 2014-08-12: 4 mg via INTRAVENOUS

## 2014-08-12 MED ORDER — ACETAMINOPHEN 650 MG RE SUPP: 650.0000 mg | Freq: Four times a day (QID) | RECTAL | Status: DC | PRN

## 2014-08-12 MED ORDER — FENTANYL CITRATE (PF) 100 MCG/2ML IJ SOLN
25.0000 ug | INTRAMUSCULAR | Status: DC | PRN
  Filled 2014-08-12: qty 2

## 2014-08-12 MED ORDER — HEPARIN SODIUM (PORCINE) 5000 UNIT/ML IJ SOLN
INTRAMUSCULAR | Status: DC | PRN
  Administered 2014-08-12: 500 mL

## 2014-08-12 MED ORDER — SODIUM CHLORIDE 0.9 % IV SOLN: 10.0000 mL/h | Freq: Once | INTRAVENOUS | Status: DC

## 2014-08-12 MED ORDER — ACETAMINOPHEN 500 MG PO TABS
1000.0000 mg | ORAL_TABLET | Freq: Four times a day (QID) | ORAL | Status: DC | PRN
  Administered 2014-08-16: 1000 mg via ORAL
  Filled 2014-08-12 (×3): qty 2

## 2014-08-12 SURGICAL SUPPLY — 51 items
CANISTER SUCTION 2500CC (MISCELLANEOUS) ×2 IMPLANT
CANNULA VESSEL 3MM 2 BLNT TIP (CANNULA) ×2 IMPLANT
CATH ROBINSON RED A/P 18FR (CATHETERS) ×1 IMPLANT
CLIP TI MEDIUM 6 (CLIP) ×2 IMPLANT
CLIP TI WIDE RED SMALL 6 (CLIP) ×2 IMPLANT
CRADLE DONUT ADULT HEAD (MISCELLANEOUS) ×2 IMPLANT
DECANTER SPIKE VIAL GLASS SM (MISCELLANEOUS) IMPLANT
DRAIN HEMOVAC 1/8 X 5 (WOUND CARE) IMPLANT
ELECT REM PT RETURN 9FT ADLT (ELECTROSURGICAL) ×2
ELECTRODE REM PT RTRN 9FT ADLT (ELECTROSURGICAL) ×1 IMPLANT
EVACUATOR SILICONE 100CC (DRAIN) IMPLANT
GAUZE SPONGE 4X4 12PLY STRL (GAUZE/BANDAGES/DRESSINGS) ×1 IMPLANT
GAUZE SPONGE 4X4 16PLY XRAY LF (GAUZE/BANDAGES/DRESSINGS) ×1 IMPLANT
GEL ULTRASOUND 20GR AQUASONIC (MISCELLANEOUS) IMPLANT
GLOVE BIO SURGEON STRL SZ7.5 (GLOVE) ×1 IMPLANT
GLOVE BIOGEL PI IND STRL 6.5 (GLOVE) IMPLANT
GLOVE BIOGEL PI INDICATOR 6.5 (GLOVE) ×1
GLOVE SURG SS PI 6.5 STRL IVOR (GLOVE) ×3 IMPLANT
GLOVE SURG SS PI 7.0 STRL IVOR (GLOVE) ×2 IMPLANT
GLOVE SURG SS PI 7.5 STRL IVOR (GLOVE) ×1 IMPLANT
GOWN STRL REUS W/ TWL LRG LVL3 (GOWN DISPOSABLE) ×3 IMPLANT
GOWN STRL REUS W/TWL LRG LVL3 (GOWN DISPOSABLE) ×6
KIT BASIN OR (CUSTOM PROCEDURE TRAY) ×2 IMPLANT
KIT ROOM TURNOVER OR (KITS) ×2 IMPLANT
LIQUID BAND (GAUZE/BANDAGES/DRESSINGS) ×2 IMPLANT
LOOP VESSEL MINI RED (MISCELLANEOUS) IMPLANT
NDL HYPO 25GX1X1/2 BEV (NEEDLE) IMPLANT
NDL PERC 18GX7CM (NEEDLE) IMPLANT
NEEDLE HYPO 25GX1X1/2 BEV (NEEDLE) IMPLANT
NEEDLE PERC 18GX7CM (NEEDLE) ×2 IMPLANT
NS IRRIG 1000ML POUR BTL (IV SOLUTION) ×3 IMPLANT
PACK CAROTID (CUSTOM PROCEDURE TRAY) ×2 IMPLANT
PAD ARMBOARD 7.5X6 YLW CONV (MISCELLANEOUS) ×3 IMPLANT
SHEATH PINNACLE 6F 10CM (SHEATH) ×1 IMPLANT
SHEATH PINNACLE R/O II 6F 4CM (SHEATH) ×1 IMPLANT
SHUNT CAROTID BYPASS 10 (VASCULAR PRODUCTS) IMPLANT
SHUNT CAROTID BYPASS 12FRX15.5 (VASCULAR PRODUCTS) IMPLANT
SPONGE INTESTINAL PEANUT (DISPOSABLE) ×1 IMPLANT
SPONGE SURGIFOAM ABS GEL 100 (HEMOSTASIS) IMPLANT
SUT ETHILON 2 0 FS 18 (SUTURE) ×2 IMPLANT
SUT ETHILON 3 0 PS 1 (SUTURE) IMPLANT
SUT PROLENE 6 0 CC (SUTURE) ×2 IMPLANT
SUT PROLENE 7 0 BV 1 (SUTURE) IMPLANT
SUT SILK 3 0 TIES 17X18 (SUTURE)
SUT SILK 3-0 18XBRD TIE BLK (SUTURE) IMPLANT
SUT VIC AB 3-0 SH 27 (SUTURE)
SUT VIC AB 3-0 SH 27X BRD (SUTURE) ×1 IMPLANT
SUT VICRYL 4-0 PS2 18IN ABS (SUTURE) ×1 IMPLANT
SYR CONTROL 10ML LL (SYRINGE) IMPLANT
TRAY FOLEY CATH 16FRSI W/METER (SET/KITS/TRAYS/PACK) ×1 IMPLANT
WATER STERILE IRR 1000ML POUR (IV SOLUTION) ×2 IMPLANT

## 2014-08-12 SURGICAL SUPPLY — 43 items
CANISTER SUCTION 2500CC (MISCELLANEOUS) ×2 IMPLANT
CANNULA VESSEL 3MM 2 BLNT TIP (CANNULA) ×2 IMPLANT
CATH ROBINSON RED A/P 18FR (CATHETERS) ×1 IMPLANT
CLIP TI MEDIUM 6 (CLIP) ×2 IMPLANT
CLIP TI WIDE RED SMALL 6 (CLIP) ×2 IMPLANT
CRADLE DONUT ADULT HEAD (MISCELLANEOUS) ×2 IMPLANT
DECANTER SPIKE VIAL GLASS SM (MISCELLANEOUS) IMPLANT
DRAIN HEMOVAC 1/8 X 5 (WOUND CARE) IMPLANT
ELECT REM PT RETURN 9FT ADLT (ELECTROSURGICAL) ×2
ELECTRODE REM PT RTRN 9FT ADLT (ELECTROSURGICAL) ×1 IMPLANT
EVACUATOR SILICONE 100CC (DRAIN) IMPLANT
GAUZE SPONGE 4X4 12PLY STRL (GAUZE/BANDAGES/DRESSINGS) ×2 IMPLANT
GEL ULTRASOUND 20GR AQUASONIC (MISCELLANEOUS) IMPLANT
GLOVE BIO SURGEON STRL SZ7.5 (GLOVE) ×1 IMPLANT
GLOVE BIOGEL PI IND STRL 6.5 (GLOVE) IMPLANT
GLOVE BIOGEL PI INDICATOR 6.5 (GLOVE) ×1
GLOVE SURG SS PI 6.5 STRL IVOR (GLOVE) ×1 IMPLANT
GLOVE SURG SS PI 7.0 STRL IVOR (GLOVE) ×3 IMPLANT
GOWN STRL REUS W/ TWL LRG LVL3 (GOWN DISPOSABLE) ×3 IMPLANT
GOWN STRL REUS W/TWL LRG LVL3 (GOWN DISPOSABLE) ×6
KIT BASIN OR (CUSTOM PROCEDURE TRAY) ×2 IMPLANT
KIT ROOM TURNOVER OR (KITS) ×2 IMPLANT
LIQUID BAND (GAUZE/BANDAGES/DRESSINGS) ×2 IMPLANT
LOOP VESSEL MINI RED (MISCELLANEOUS) IMPLANT
NDL HYPO 25GX1X1/2 BEV (NEEDLE) IMPLANT
NEEDLE HYPO 25GX1X1/2 BEV (NEEDLE) IMPLANT
NS IRRIG 1000ML POUR BTL (IV SOLUTION) ×4 IMPLANT
PACK CAROTID (CUSTOM PROCEDURE TRAY) ×2 IMPLANT
PAD ARMBOARD 7.5X6 YLW CONV (MISCELLANEOUS) ×4 IMPLANT
SHUNT CAROTID BYPASS 10 (VASCULAR PRODUCTS) IMPLANT
SHUNT CAROTID BYPASS 12FRX15.5 (VASCULAR PRODUCTS) IMPLANT
SPONGE INTESTINAL PEANUT (DISPOSABLE) ×1 IMPLANT
SPONGE SURGIFOAM ABS GEL 100 (HEMOSTASIS) IMPLANT
SUT ETHILON 3 0 PS 1 (SUTURE) IMPLANT
SUT PROLENE 6 0 CC (SUTURE) ×1 IMPLANT
SUT PROLENE 7 0 BV 1 (SUTURE) IMPLANT
SUT SILK 3 0 TIES 17X18 (SUTURE)
SUT SILK 3-0 18XBRD TIE BLK (SUTURE) IMPLANT
SUT VIC AB 3-0 SH 27 (SUTURE) ×2
SUT VIC AB 3-0 SH 27X BRD (SUTURE) ×1 IMPLANT
SUT VICRYL 4-0 PS2 18IN ABS (SUTURE) ×2 IMPLANT
SYR CONTROL 10ML LL (SYRINGE) IMPLANT
WATER STERILE IRR 1000ML POUR (IV SOLUTION) ×2 IMPLANT

## 2014-08-12 NOTE — Progress Notes (Signed)
[facility] Progress Note Patient Name: Jeanne Lawson DOB: 1945/12/18 MRN: [id number]   Date of Service  08/12/2014  HPI/Events of Note  Notified patient has not had post intubation CXR.  eICU Interventions  Checking stat portable CXR to confirm tube placement     Intervention Category Minor Interventions: Clinical assessment - ordering diagnostic tests  Tera Partridge 08/12/2014, 3:19 PM

## 2014-08-12 NOTE — Progress Notes (Signed)
Dr. Jaquita Folds in to see pt. Due to low pulse, ordered to NOT give any PO Nimotop. Pt took Plavix 75mg  PO this morning at home, not given in preop. 325mg  PO EC aspirin given in preop per Dr. Patrecia Pour.

## 2014-08-12 NOTE — Anesthesia Preprocedure Evaluation (Signed)
Anesthesia Evaluation  Patient identified by MRN, date of birth, ID band Patient awake    Reviewed: Allergy & Precautions, NPO status , Patient's Chart, lab work & pertinent test results  Airway Mallampati: II  TM Distance: >3 FB Neck ROM: Full    Dental  (+) Teeth Intact, Dental Advisory Given   Pulmonary former smoker,  breath sounds clear to auscultation        Cardiovascular hypertension, Rhythm:Regular Rate:Normal     Neuro/Psych    GI/Hepatic   Endo/Other    Renal/GU      Musculoskeletal   Abdominal   Peds  Hematology   Anesthesia Other Findings   Reproductive/Obstetrics                             Anesthesia Physical Anesthesia Plan  ASA: III  Anesthesia Plan: General   Post-op Pain Management:    Induction: Intravenous  Airway Management Planned: Oral ETT  Additional Equipment: Arterial line  Intra-op Plan:   Post-operative Plan: Post-operative intubation/ventilation  Informed Consent: I have reviewed the patients History and Physical, chart, labs and discussed the procedure including the risks, benefits and alternatives for the proposed anesthesia with the patient or authorized representative who has indicated his/her understanding and acceptance.   Dental advisory given  Plan Discussed with: CRNA and Anesthesiologist  Anesthesia Plan Comments:         Anesthesia Quick Evaluation

## 2014-08-12 NOTE — Progress Notes (Addendum)
Patient ID: Jeanne Lawson, female   DOB: May 22, 1945, 69 y.o.   MRN: [id number] Will obtain US of the neck, to include carotids in am. Dr [name] MD

## 2014-08-12 NOTE — Progress Notes (Signed)
Called Dr. [name] with critical value of PTT >200.  He said to stop the heparin for 3 hours and then resume at same rate.  At that time, get a STAT PTT. Heparin was stopped.  Verbal order was put in for PTT STAT draw at 2030. Jeanne Lawson 5:41 PM 08/12/2014

## 2014-08-12 NOTE — Progress Notes (Signed)
ANTICOAGULATION CONSULT NOTE - Initial Consult  Pharmacy Consult for heparin Indication: coiling/stenting  Allergies  Allergen Reactions  . Dilaudid [Hydromorphone] Swelling  . Codeine Nausea And Vomiting  . Hydromorphone Hcl Swelling  . Latex Swelling    gloves used at dental office caused lips to swell. Used different ones no problem. Never had any problem at hospital or anywhere else.  . Sulfa Drugs Cross Reactors Other (See Comments)    Unknown childhood reaction    Patient Measurements: Height: 5\' 1"  (154.9 cm) Weight: 151 lb 14 oz (68.89 kg) IBW/kg (Calculated) : 47.8 Heparin Dosing Weight: 62kg  Vital Signs: Temp: 97.5 F (36.4 C) (07/20 0712) Temp Source: Oral (07/20 0712) BP: 143/65 mmHg (07/20 0712) Pulse Rate: 53 (07/20 0712)  Labs: No results for input(s): HGB, HCT, PLT, APTT, LABPROT, INR, HEPARINUNFRC, CREATININE, CKTOTAL, CKMB, TROPONINI in the last 72 hours.  Estimated Creatinine Clearance: 58.1 mL/min (by C-G formula based on Cr of 0.77).   Medical History: Past Medical History  Diagnosis Date  . Ventricular hypertrophy 04/2009    LVH with diastolic dysfunction by echo. Has normal EF.  Marland Kitchen Pancreatitis     x2  . PAC (premature atrial contraction)   . TIA (transient ischemic attack)     She was hospitalized 04-23-09 through 04-27-09 for involving right side of the body  . Hypertension   . Tobacco abuse     Ongoing   . Edema of foot     She has a history of chronic edema of the left dated back to age 30 when she suufered severe frostbite playing  on the snow as a child  . Coronary artery disease 1996    Known with prior mild lesion of LAD demonstrated by Cardiac Catheterization in 1996  . COPD (chronic obstructive pulmonary disease)   . PONV (postoperative nausea and vomiting)     problems waking up last time 4/16 only time  . Shortness of breath dyspnea     since stroke 2 months ago -  . Saccular aneurysm     She also has 2 known which were stable  between the MRA of October2010 and the MRA  of April 2011.  . 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 CRNA received call intra-op to have heparin IV started per pharmacy. She is s/p R PCOM aneurysm embolization using pipeline flow diverter.  Goal of Therapy:  Heparin level 0.1-0.25 units/ml Monitor platelets by anticoagulation protocol: Yes   Plan:  -started on heparin IV 600 units/hr (~9units/kg/hr) -heparin level in 8 hours at 2300 -heparin to stop 7/21 at 0700 for sheath removal  Kylon Philbrook D. Hila Bolding, PharmD, BCPS Clinical Pharmacist Pager: [phone] 08/12/2014 2:36 PM

## 2014-08-12 NOTE — Consult Note (Signed)
PULMONARY / CRITICAL CARE MEDICINE   Name: Jeanne Lawson MRN: [id number] DOB: 30-Jun-1945    ADMISSION DATE:  08/12/2014 CONSULTATION DATE:  7/20  REFERRING MD :  Oneida Alar  CHIEF COMPLAINT:  Vent management   INITIAL PRESENTATION:  69 yo female with hx CAD, COPD, HTN, previous CVA and intracranial aneurysms with previous attempt at IR femoral approach embolization or RICA aneurysm but was unsuccessful due to a very tortuous aortic arch.  She returned 7/20 for elective surgical repair with open exposure and repair of R common carotid artery to allow access for intracerebral coiling of aneurysm.  PCCM consulted for post-op vent management.    STUDIES:    SIGNIFICANT EVENTS: 7/20>> OR - open exposure and repair of R common carotid artery and intracerebral coiling of aneurysm   HISTORY OF PRESENT ILLNESS:  69 yo female with hx CAD, COPD, HTN, previous CVA and intracranial aneurysms with previous attempt at IR femoral approach embolization or RICA aneurysm but was unsuccessful due to a very tortuous aortic arch.  She returned 7/20 for elective surgical repair with open exposure and repair of R common carotid artery to allow access for intracerebral coiling of aneurysm.  PCCM consulted for post-op vent management.    PAST MEDICAL HISTORY :   has a past medical history of Ventricular hypertrophy (04/2009); Pancreatitis; PAC (premature atrial contraction); TIA (transient ischemic attack); Hypertension; Tobacco abuse; Edema of foot; Coronary artery disease (1996); COPD (chronic obstructive pulmonary disease); PONV (postoperative nausea and vomiting); Shortness of breath dyspnea; Saccular aneurysm; and Stroke.  has past surgical history that includes Vesicovaginal fistula closure w/ TAH (25 yrs ago); Neck surgery (50 yrs ago); US echocardiography (04-26-2009); Cardiovascular stress test (12-02-2001); Cardiac catheterization (1996); Abdominal hysterectomy; laparoscopy; Radiology with anesthesia (N/A,  05/25/2014); and Appendectomy. Prior to Admission medications   Medication Sig Start Date End Date Taking? Authorizing Provider  albuterol (PROVENTIL HFA;VENTOLIN HFA) 108 (90 BASE) MCG/ACT inhaler Inhale 1-2 puffs into the lungs every 6 (six) hours as needed for wheezing or shortness of breath.   Yes Historical Provider, MD  aspirin EC 81 MG tablet Take 81 mg by mouth daily.   Yes Historical Provider, MD  atorvastatin (LIPITOR) 40 MG tablet Take 1 tablet (40 mg total) by mouth daily at 6 PM. 05/27/14  Yes Darlin Coco, MD  clopidogrel (PLAVIX) 75 MG tablet TAKE 1 TABLET BY MOUTH DAILY. 05/27/14  Yes Darlin Coco, MD  lisinopril-hydrochlorothiazide (PRINZIDE,ZESTORETIC) 20-12.5 MG per tablet Take 1 tablet by mouth daily. 05/27/14  Yes Darlin Coco, MD  TOPROL XL 50 MG 24 hr tablet TAKE 1/2 TABLET BY MOUTH TWICE DAILY. 07/01/14  Yes Darlin Coco, MD  mometasone-formoterol (DULERA) 100-5 MCG/ACT AERO Inhale 2 puffs into the lungs 2 (two) times daily. Patient not taking: Reported on 05/15/2014 05/04/14   Janece Canterbury, MD   Allergies  Allergen Reactions  . Dilaudid [Hydromorphone] Swelling  . Codeine Nausea And Vomiting  . Hydromorphone Hcl Swelling  . Latex Swelling    gloves used at dental office caused lips to swell. Used different ones no problem. Never had any problem at hospital or anywhere else.  [facility] Drugs Cross Reactors Other (See Comments)    Unknown childhood reaction    FAMILY HISTORY:  indicated that her mother is alive. She indicated that her father is deceased. She indicated that her sister is alive.  SOCIAL HISTORY:  reports that she quit smoking about 3 months ago. Her smoking use included Cigarettes. She has a 30 pack-year smoking  history. She has never used smokeless tobacco. She reports that she does not drink alcohol or use illicit drugs.  REVIEW OF SYSTEMS:  Unable.  Pt sedated on vent post op.    SUBJECTIVE:   VITAL SIGNS: Temp:  [97.5 F (36.4 C)] 97.5  F (36.4 C) (07/20 0712) Pulse Rate:  [53] 53 (07/20 0712) Resp:  [20] 20 (07/20 0712) BP: (143)/(65) 143/65 mmHg (07/20 0712) SpO2:  [100 %] 100 % (07/20 0712) Weight:  [151 lb 14 oz (68.89 kg)] 151 lb 14 oz (68.89 kg) (07/20 0712) HEMODYNAMICS:   VENTILATOR SETTINGS:   INTAKE / OUTPUT:  Intake/Output Summary (Last 24 hours) at 08/12/14 1436 Last data filed at 08/12/14 1324  Gross per 24 hour  Intake   1000 ml  Output   1130 ml  Net   -130 ml    PHYSICAL EXAMINATION: General:  Acutely ill appearing female. Neuro:  Sedated and intubated.  Withdraws to pain. HEENT:  /AT, PERRL, EOM-I and MMM. Cardiovascular:  RRR, Nl S1/S2, -M/R/G. Lungs:  Coarse BS diffusely. Abdomen:  Soft, NT, ND and +BS. Musculoskeletal:  Intact. Skin:  Intact.  LABS:  CBC  Recent Labs Lab 08/07/14 1048  WBC 4.3  HGB 12.6  HCT 36.9  PLT 128*   Coag's  Recent Labs Lab 08/07/14 1048  APTT 29  INR 1.01   BMET  Recent Labs Lab 08/07/14 1048  NA 138  K 4.2  CL 104  CO2 26  BUN 15  CREATININE 0.77  GLUCOSE 102*   Electrolytes  Recent Labs Lab 08/07/14 1048  CALCIUM 8.9   Sepsis Markers No results for input(s): LATICACIDVEN, PROCALCITON, O2SATVEN in the last 168 hours. ABG No results for input(s): PHART, PCO2ART, PO2ART in the last 168 hours. Liver Enzymes  Recent Labs Lab 08/07/14 1048  AST 20  ALT 17  ALKPHOS 40  BILITOT 0.6  ALBUMIN 3.5   Cardiac Enzymes No results for input(s): TROPONINI, PROBNP in the last 168 hours. Glucose No results for input(s): GLUCAP in the last 168 hours.  Imaging No results found.   ASSESSMENT / PLAN:  PULMONARY OETT 7/20>>> Post op vent dependence Hx COPD   P:   Vent support - 8cc/kg  F/u CXR  F/u ABG PRN BD - dulera listed as home med but pt reportedly not taking    CARDIOVASCULAR Hx HTN  CAD P:  Hold asa, plavix for now - resume when ok with VVS, neuro IR Hold home anti-HTN  Assess need to resume home B  blocker (toprol xl 25mg  bid at home)   RENAL No active issue  P:   F/u chem   GASTROINTESTINAL No active issue  P:   PPI  NPO  Assess need for TF in am 7/21 if not extubated   HEMATOLOGIC No active issue  P:  SCD's  F/u CBC  Low dose Heparin gtt per neuro post embolization   INFECTIOUS No active issue  P:   Monitor wbc, fever curve off abx   ENDOCRINE No active issue  P:   Monitor glucose on chem   NEUROLOGIC Intracerebral aneurysm - s/p coiling  Exposure/repair of carotid  P:   RASS goal: -1 Post op care per neuro, VVS Propofol gtt if BP allows  Daily WUA    FAMILY  - Updates:   - Inter-disciplinary family meet or Palliative Care meeting due by:  7/27  Nickolas Madrid, NP 08/12/2014  2:37 PM Pager: [phone] or [phone]  Attending Note:  Patient s/p IR procedure for aneurysmal repairs.  The patient was kept intubated as had to be still for 24 hours.  Will start propofol.  Neo for BP support.  Continue full vent support and will beg in SBT when IR feels patient is ready.  In the meantime continue current care.  Physical exam I documented above.  AM labs ordered.  The patient is critically ill with multiple organ systems failure and requires high complexity decision making for assessment and support, frequent evaluation and titration of therapies, application of advanced monitoring technologies and extensive interpretation of multiple databases.   Critical Care Time devoted to patient care services described in this note is  35  Minutes. This time reflects time of care of this signee Dr Jennet Maduro. This critical care time does not reflect procedure time, or teaching time or supervisory time of PA/NP/Med student/Med Resident etc but could involve care discussion time.  Rush Farmer, M.D. [facility] Pulmonary/Critical Care Medicine. Pager: [phone]. After hours pager: [phone].

## 2014-08-12 NOTE — Anesthesia Procedure Notes (Addendum)
Procedure Name: Intubation Date/Time: 08/12/2014 9:21 AM Performed by: [place] Pink Pre-anesthesia Checklist: Patient identified, Emergency Drugs available, Suction available, Patient being monitored and Timeout performed Patient Re-evaluated:Patient Re-evaluated prior to inductionOxygen Delivery Method: Circle system utilized Preoxygenation: Pre-oxygenation with 100% oxygen Intubation Type: IV induction Ventilation: Mask ventilation without difficulty Laryngoscope Size: Miller and 2 Grade View: Grade I Tube type: Subglottic suction tube Tube size: 7.5 mm Number of attempts: 2 Airway Equipment and Method: Bougie stylet Placement Confirmation: ETT inserted through vocal cords under direct vision,  positive ETCO2,  CO2 detector and breath sounds checked- equal and bilateral Secured at: 20 cm Dental Injury: Teeth and Oropharynx as per pre-operative assessment  Comments: DLx1 with Miller 2 grade 3 view, DL x2 with MAC 3 grade 4 view, removed pillow, DLx3 with Miller 1 grade 1 view. Bougie used to place ETT.

## 2014-08-12 NOTE — H&P (Signed)
Chief Complaint: Patient was seen in consultation today for cerebral aneurysm at the request of Deveshwar,Sanjeev  Referring Physician(s): Dr Lavera Guise  History of Present Illness: Jeanne Lawson is a 69 y.o. 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  The procedure was therefore stopped. The patient was extubated. Upon recovery patient exhibited no new neurological signs or symptoms.  IMPRESSION: Approximately 9.8 mm x 8 mm saccular aneurysm arising in the right posterior communicating artery region.  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  Segmental mild to moderate fusiform dilatation of the right internal carotid artery. 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  The patient's general anesthesia was reversed and the patient was extubated.  Now pt has been rescheduled for embolization of R ICA PCOM aneurysm. Dr Andreas Blower will gain access into R carotid artery He has seen and discussed this procedure with pt and family. She will then be transported to Interventional Radiology with Dr [name] for embolization.   Past Medical History  Diagnosis Date  . Ventricular hypertrophy 04/2009    LVH with diastolic dysfunction by echo. Has normal EF.  Marland Kitchen Pancreatitis     x2  . PAC (premature atrial contraction)   . TIA (transient ischemic attack)     She was hospitalized 04-23-09 through 04-27-09 for involving right side of the body  .  Hypertension   . Tobacco abuse     Ongoing   . Edema of foot     She has a history of chronic edema of the left dated back to age 27 when she suufered severe frostbite playing  on the snow as a child  . Coronary artery disease 1996    Known with prior mild lesion of LAD demonstrated by Cardiac Catheterization in 1996  . COPD (chronic obstructive pulmonary disease)   . PONV (postoperative nausea and vomiting)     problems waking up last time 4/16 only time  . Shortness of breath dyspnea     since stroke 2 months ago -  . 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  . 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 Neck surgery  50 yrs ago    Left side tumor  . US echocardiography  04-26-2009    EF 65-70%  . Cardiovascular stress test  12-02-2001    EF 70%  . Cardiac catheterization  1996    Mild CAD with vasospasm  . Abdominal hysterectomy    . Laparoscopy    . Radiology with anesthesia N/A 05/25/2014    Procedure: RADIOLOGY WITH ANESTHESIA;  Surgeon: [name], MD;  Location: [facility];  Service: Radiology;  Laterality: N/A;  . Appendectomy  Allergies: Dilaudid; Codeine; Hydromorphone hcl; Latex; and Sulfa drugs cross reactors  Medications: Prior to Admission medications   Medication Sig Start Date End Date Taking? Authorizing Provider  albuterol (PROVENTIL HFA;VENTOLIN HFA) 108 (90 BASE) MCG/ACT inhaler Inhale 1-2 puffs into the lungs every 6 (six) hours as needed for wheezing or shortness of breath.    Historical Provider, MD  aspirin EC 81 MG tablet Take 81 mg by mouth daily.    Historical Provider, MD  atorvastatin (LIPITOR) 40 MG tablet Take 1 tablet (40 mg total) by mouth daily at 6 PM. 05/27/14   Darlin Coco, MD    clopidogrel (PLAVIX) 75 MG tablet TAKE 1 TABLET BY MOUTH DAILY. 05/27/14   Darlin Coco, MD  lisinopril-hydrochlorothiazide (PRINZIDE,ZESTORETIC) 20-12.5 MG per tablet Take 1 tablet by mouth daily. 05/27/14   Darlin Coco, MD  mometasone-formoterol (DULERA) 100-5 MCG/ACT AERO Inhale 2 puffs into the lungs 2 (two) times daily. Patient not taking: Reported on 05/15/2014 05/04/14   Janece Canterbury, MD  TOPROL XL 50 MG 24 hr tablet TAKE 1/2 TABLET BY MOUTH TWICE DAILY. 07/01/14   Darlin Coco, MD     Family History  Problem Relation Age of Onset  . Emphysema Father   . Aneurysm Sister   . Stroke Mother   . Hypertension Mother   . Hypertension Daughter   . Thyroid disease Daughter     History   Social History  . Marital Status: Widowed    Spouse Name: N/A  . Number of Children: N/A  . Years of Education: N/A   Social History Main Topics  . Smoking status: Former Smoker -- 1.00 packs/day for 30 years    Types: Cigarettes    Quit date: 04/26/2014  . Smokeless tobacco: Never Used  . Alcohol Use: No  . Drug Use: No  . Sexual Activity: Not on file   Other Topics Concern  . None   Social History Narrative     Review of Systems: A 12 point ROS discussed and pertinent positives are indicated in the HPI above.  All other systems are negative.  Review of Systems  Constitutional: Negative for activity change and appetite change.  HENT: Negative for hearing loss, sore throat and tinnitus.   Eyes: Negative for visual disturbance.  Respiratory: Negative for shortness of breath.   Cardiovascular: Negative for chest pain.  Gastrointestinal: Negative for abdominal pain.  Neurological: Positive for weakness. Negative for dizziness, tremors, seizures, syncope, facial asymmetry, speech difficulty, light-headedness, numbness and headaches.  Psychiatric/Behavioral: Negative for behavioral problems and confusion.    Vital Signs: There were no vitals taken for this visit.  Physical  Exam  Constitutional: She is oriented to person, place, and time. She appears well-nourished.  HENT:  Head: Atraumatic.  Eyes: EOM are normal.  Neck: Normal range of motion. Neck supple.  Cardiovascular: Normal rate, regular rhythm and normal heart sounds.   No murmur heard. Pulmonary/Chest: Effort normal and breath sounds normal. She has no wheezes.  Abdominal: Soft. Bowel sounds are normal. There is no tenderness.  Musculoskeletal: Normal range of motion.  Neurological: She is alert and oriented to person, place, and time.  Skin: Skin is warm and dry.  Psychiatric: She has a normal mood and affect. Her behavior is normal. Judgment and thought content normal.  Nursing note and vitals reviewed.   Mallampati Score:  MD Evaluation Airway: WNL Heart: WNL Abdomen: WNL Chest/ Lungs: WNL ASA  Classification: 3 Mallampati/Airway Score: One  Imaging: No results found.  Labs:  CBC:  Recent Labs  05/02/14 1313 05/02/14 1324 05/04/14 0703 05/12/14 1450 08/07/14 1048  WBC 6.1  --  5.4 5.6 4.3  HGB 13.8 15.0 13.7 14.0 12.6  HCT 40.0 44.0 40.6 41.2 36.9  PLT 145*  --  128* 162 128*    COAGS:  Recent Labs  05/02/14 1313 05/12/14 1450 08/07/14 1048  INR 1.02 1.02 1.01  APTT 29  --  29    BMP:  Recent Labs  05/03/14 1910 05/04/14 0703 05/12/14 1450 08/07/14 1048  NA 138 139 138 138  K 3.8 4.2 3.6 4.2  CL 101 105 100 104  CO2 31 28 29 26   GLUCOSE 123* 104* 93 102*  BUN 9 11 13 15   CALCIUM 9.3 8.8 9.1 8.9  CREATININE 0.79 0.72 0.66 0.77  GFRNONAA 82* 85* 87* >60  GFRAA >90 >90 >90 >60    LIVER FUNCTION TESTS:  Recent Labs  05/03/14 1910 08/07/14 1048  BILITOT 1.1 0.6  AST 29 20  ALT 20 17  ALKPHOS 42 40  PROT 6.9 6.6  ALBUMIN 3.6 3.5    TUMOR MARKERS: No results for input(s): AFPTM, CEA, CA199, CHROMGRNA in the last 8760 hours.  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 Dr Oneida Alar will gain access to R carotid artery prior to transport to IR for embolization Pt and family aware of procedure and plan---agreeable to proceed. Risks and Benefits discussed with the patient including, but not limited to bleeding, infection, vascular injury, contrast induced renal failure, stroke or even death. All of the patient's questions were answered, patient is agreeable to proceed. Consent signed and in chart.  Pt and family aware she will be admitted after to procedure to Neuro ICU. Plan for discharge 1-2 days.   Thank you for this interesting consult.  I greatly enjoyed meeting Jeanne Lawson and look forward to participating in their care.  A copy of this report was sent to the requesting provider on this date.  Signed: Carla Whilden A 08/12/2014, 8:08 AM   I spent a total of  30 Minutes   in face to face in clinical consultation, greater than 50% of which was counseling/coordinating care for cerebral arteriogram/aneurysm embolization

## 2014-08-12 NOTE — Progress Notes (Signed)
Called to see pt for oozing from right neck incision.  Heparin currently off with plans to restart at 830 pm No hematoma Pinpoint oozing near base of incision which looks like the needlehole where her sheath was temporarily sutured Agree with keeping heparin off for a while.  Direct pressure on hole should control this.  No need for exploration currently as no obvious hematoma  Ruta Hinds, MD [facility] Office: [phone] Pager: [phone]

## 2014-08-12 NOTE — Progress Notes (Signed)
Day of Surgery  Subjective: S/P RT PCoM aneurysm embolization using the pipeline flow diverter. Under GA .Direct rt common carotid access ,post closure of CCA access.. Prenently intubared with propofol, for airway proptection. On IV heparin.per orders nurse and family noted to move all fours spontaneously..Occassionally Will respond to simple commands. Noted to have oozing of blood  from  Neck access site.  Objective: Vital signs in last 24 hours: Temp:  [97.5 F (36.4 C)] 97.5 F (36.4 C) (07/20 0712) Pulse Rate:  [53-71] 71 (07/20 1500) Resp:  [14-20] 14 (07/20 1500) BP: (102-143)/(54-65) 102/54 mmHg (07/20 1500) SpO2:  [98 %-100 %] 98 % (07/20 1500) Arterial Line BP: (102)/(58) 102/58 mmHg (07/20 1500) FiO2 (%):  [40 %] 40 % (07/20 1500) Weight:  [151 lb 14 oz (68.89 kg)-160 lb 11.5 oz (72.9 kg)] 160 lb 11.5 oz (72.9 kg) (07/20 1500)    Intake/Output from previous day:   Intake/Output this shift: Total I/O In: -  Out: 1355 [Urine:1205; Blood:150]   On exam.BP variable according to level of consciosness 100s/50s to 150s/70s. HR 50 to 70s SR.PaO2  98% Fresh ooze  Of blood  Contained. underneath the techoderm.. Neurologically sedated  On propofol.Will ocassionally repond to simple commands ,and move all 4s. Equally. No gross  facial asymmetry. Mouthing and coughing on the tube,.    Lab Results:  No results for input(s): WBC, HGB, HCT, PLT in the last 72 hours. BMET No results for input(s): NA, K, CL, CO2, GLUCOSE, BUN, CREATININE, CALCIUM in the last 72 hours. PT/INR No results for input(s): LABPROT, INR in the last 72 hours. ABG No results for input(s): PHART, HCO3 in the last 72 hours.  Invalid input(s): PCO2, PO2  Studies/Results: No results found.  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 Plan. 1.Continue  With sedation and airway protection  2.Strict adherence to BP  between 110 mmhg to 130 mmhg  3.Change  Neck wound dressing. 4 Stat PT,PTT INR ,CBC and  p2Y12  5.Ptt to be maintained  at 50 to 60 secs . D/W family  Rob Hickman 08/12/2014

## 2014-08-12 NOTE — Progress Notes (Signed)
Patient attempting to pull tubes out with bilateral mitts on, Elink notified.  Orders received for bilateral soft wrist restraints. Will continue to monitor.

## 2014-08-12 NOTE — H&P (Signed)
Expand All Collapse All   [facility] HISTORY AND PHYSICAL    History of Present Illness:  Patient is a 69 y.o. year old female who presents for evaluation of her carotid arteries. She is referred by Dr. [name] for consideration of open exposure of her common carotid artery to treat intracranial aneurysms with coiling/stenting. He has known intracranial aneurysms. Dr. Angelica Chessman is currently evaluating her for an endovascular intervention. However due to tortuosities and shape of her aortic arch a femoral approach is not possible. He has asked me to see the patient for possibility of exposing the common carotid artery for exposure for his intervention. Patient has had a prior stroke thought to be related to her aneurysms. She has some residual left arm weakness. Other residual effect is a sensation of a sandpaper type feel to her left hand. 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 She has had no other neck operations. She is currently on Plavix and aspirin. Other medical problems include has CVA; STRESS FRACTURE, FOOT; TIA (transient ischemic attack); Hypertension; Ventricular hypertrophy; Tobacco abuse; Chronic ischemic heart disease; CVA (cerebral infarction); Aneurysm; Expressive aphasia; Saccular aneurysm; Aphasia; Palmar erythema; and Brain aneurysm on her problem list.    Past Medical History   Diagnosis  Date   .  Ventricular hypertrophy  04/2009       LVH with diastolic dysfunction by echo. Has normal EF.   Marland Kitchen  Pancreatitis         x2   .  PAC (premature atrial contraction)     .  TIA (transient ischemic attack)         She was hospitalized 04-23-09 through 04-27-09 for involving right side of the body   .  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  Hypertension     .  Tobacco abuse      Ongoing    .  Edema of foot         She has a history of chronic edema of the left dated back to age 67 when she suufered severe frostbite playing  on the snow as a child   .  Coronary artery disease  1996       Known with prior mild lesion of LAD demonstrated by Cardiac Catheterization in 1996   .  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   Marland Kitchen  COPD (chronic obstructive pulmonary disease)         Past Surgical History   Procedure  Laterality  Date   .  Vesicovaginal fistula closure w/ tah    25 yrs ago   .  Neck surgery    48 yrs ago       Left side   .  US echocardiography    04-26-2009       EF 65-70%   .  Cardiovascular stress test    12-02-2001       EF 70%   .  Cardiac catheterization    1996       Mild CAD with vasospasm   .  Abdominal hysterectomy       .  Laparoscopy       .  Radiology with anesthesia  N/A  05/25/2014       Procedure: RADIOLOGY WITH ANESTHESIA;  Surgeon: [name], MD;  Location: [facility];  Service: Radiology;  Laterality: N/A;     Social History History   Substance Use Topics   .  Smoking status:  Former Smoker -- 1.00 packs/day for 30 years       Types:  Cigarettes       Quit date:  04/26/2014   .  Smokeless tobacco:  Never Used   .  Alcohol Use:  No     Family History Family History   Problem  Relation  Age of Onset   .  Emphysema  Father     .  Aneurysm  Sister     .  Stroke  Mother     .  Hypertension  Mother     .  Hypertension  Daughter     .  Thyroid disease  Daughter       Allergies    Allergies   Allergen  Reactions   .  Dilaudid [Hydromorphone]  Swelling   .  Codeine  Nausea And Vomiting   .  Hydromorphone Hcl  Swelling   .  Sulfa Drugs Cross Reactors  Other (See Comments)       Unknown childhood reaction        Current Outpatient Prescriptions   Medication  Sig  Dispense  Refill   .  albuterol (PROVENTIL HFA;VENTOLIN HFA) 108 (90 BASE)  MCG/ACT inhaler  Inhale 1-2 puffs into the lungs every 6 (six) hours as needed for wheezing or shortness of breath.       Marland Kitchen  aspirin EC 81 MG tablet  Take 81 mg by mouth daily.       Marland Kitchen  atorvastatin (LIPITOR) 40 MG tablet  Take 1 tablet (40 mg total) by mouth daily at 6 PM.  30 tablet  3   .  clopidogrel (PLAVIX) 75 MG tablet  TAKE 1 TABLET BY MOUTH DAILY.  30 tablet  5   .  lisinopril-hydrochlorothiazide (PRINZIDE,ZESTORETIC) 20-12.5 MG per tablet  Take 1 tablet by mouth daily.  30 tablet  3   .  TOPROL XL 50 MG 24 hr tablet  TAKE 1/2 TABLET BY MOUTH TWICE DAILY.  30 tablet  3   .  mometasone-formoterol (DULERA) 100-5 MCG/ACT AERO  Inhale 2 puffs into the lungs 2 (two) times daily. (Patient not taking: Reported on 05/15/2014)  1 Inhaler  0      No current facility-administered medications for this visit.     ROS:    General:  No weight loss, Fever, chills  HEENT: No recent headaches, no nasal bleeding, no visual changes, no sore throat  Neurologic: No dizziness, blackouts, seizures. + recent symptoms of stroke or mini- stroke. No recent episodes of slurred speech, or temporary blindness.  Cardiac: No recent episodes of chest pain/pressure, no shortness of breath at rest.  No shortness of breath with exertion.  Denies history of atrial fibrillation or irregular heartbeat  Vascular: No history of rest pain in feet.  No history of claudication.  No history of non-healing ulcer, No history of DVT    Pulmonary: No home oxygen, no productive cough, no hemoptysis,  No asthma or wheezing  Musculoskeletal:  [ ]  Arthritis, [ ]  Low back pain,  [ ]  Joint pain  Hematologic:No history of hypercoagulable state.  No history of easy bleeding.  No history of anemia  Gastrointestinal: No hematochezia or melena,  No gastroesophageal reflux, no trouble swallowing  Urinary: [ ]  chronic Kidney disease, [ ]  on HD - [ ]  MWF or [ ]  TTHS, [ ]  Burning with urination, [ ]  Frequent urination, [ ]  Difficulty  urinating;    Skin: No rashes  Psychological: No history of anxiety,  No history of depression   Physical Examination     Filed Vitals:   08/12/14 0712  BP: 143/65  Pulse: 53  Temp: 97.5 F (36.4 C)  TempSrc: Oral  Resp: 20  Height: 5\' 1"  (1.549 m)  Weight: 151 lb 14 oz (68.89 kg)  SpO2: 100%     General:  Alert and oriented, no acute distress HEENT: Normal Neck: No bruit or JVD Pulmonary: Clear to auscultation bilaterally Cardiac: Regular Rate and Rhythm without murmur Abdomen: Soft, non-tender, non-distended, no mass Skin: No rash Extremity Pulses:  2+ radial, brachial, femoral, dorsalis pedis, posterior tibial pulses bilaterally Musculoskeletal: No deformity or edema     Neurologic: Upper and lower extremity motor 5/5 and symmetric, subtle left hand intrinsic weakness  DATA:  Carotid duplex scan was performed today which showed less than 50% stenosis bilaterally. I reviewed and interpreted this study.   ASSESSMENT:  I discussed with the patient and her family today that I would be involved in her procedure with exposing the common carotid artery. Complications included but not limited to bleeding infection cranial nerve injury remote risk of stroke but I did emphasize to her that there would be risk of stroke with Dr. Enriqueta Shutter procedure.   PLAN:  Carotid exposure right side today for access for aneurysm coiling  Ruta Hinds, MD [facility]: [phone] Pager: [phone]

## 2014-08-12 NOTE — Op Note (Signed)
Procedure: Exposure and repair of right common carotid artery  Preoperative diagnosis: Intracranial aneurysm  Postoperative diagnosis: Same  Anesthesia: Gen.  Assistant: Silva Bandy PA-C, Leontine Locket PA-C  Indications: Patient is a 70 year old female who has a very tortuous aortic arch and needs access for intracerebral coiling of an aneurysm.  Operative details: After obtaining informed consent, the patient was taken to the operating. The patient was placed in supine position operating table. After induction general anesthesia and endotracheal intubation, a Foley catheter was placed. The patient's entire right neck and chest were prepped and draped in usual sterile fashion. An oblique incision was made at the base of the right neck and carried through the subcutaneous tissue and platysma muscle. The sternal cleidomastoid muscle was identified and reflected laterally. The internal jugular vein was identified at the base of the incision as well as the vagus nerve. These were both protected and reflected laterally. The common carotid artery was dissected free at the base of the incision. A vessel loop was placed around this. Next an introducer needle was used to cannulate the right common carotid artery and an 035 J-tipped guidewire threaded in the common carotid artery. A short 6 French Pinnacle sheath was then placed over this. The sheath was sutured to the skin with nylon sutures. The sheath was thoroughly flushed with heparinized saline. A dry 4 x 4 was placed over the sheath as well as an India dressing. The patient was then transported to interventional radiology for her procedure. This is dictated as a separate operative note by Dr. Neoma Laming scar.  After the patient had completed her intracerebral procedure, she was returned operating room 16. The wound was inspected and found to be hemostatic. A figure-of-eight 5 0 prolene suture was placed around the sheath. The sheath was then removed.  Hemostasis was obtained. The wound was thoroughly irrigated with normal saline solution. The platysma muscle was reapproximated using a running 3-0 Vicryl suture. The skin was closed with a 4 Vicryl subcuticular stitch. Liquiband was applied to the incision. The patient tolerated the procedure well and there were no known complications. The patient was transported to the neuro ICU in stable condition on a ventilator.  The instrument sponge and needle counts were correct at the end of the case.  Ruta Hinds, MD [facility] Office: [phone] Pager: [phone]

## 2014-08-12 NOTE — Progress Notes (Signed)
Referring Physician(s): Xu  Chief Complaint:  R PCOM aneurysm  Subjective:  R PCOM aneurysm pipeline stent placed via carotid access 7/20 am Tolerated well Post procedure pt returned to OR for closure at carotid access with Dr Oneida Alar Now in Neuro ICU Oozing noted at neck site---pooling  RN called Dr Duffy Bruce enforce dressing Dr [name] has seen and examined pt Pt is moving extremities spontaneously  Allergies: Dilaudid; Codeine; Hydromorphone hcl; Latex; and Sulfa drugs cross reactors  Medications: Prior to Admission medications   Medication Sig Start Date End Date Taking? Authorizing Provider  albuterol (PROVENTIL HFA;VENTOLIN HFA) 108 (90 BASE) MCG/ACT inhaler Inhale 1-2 puffs into the lungs every 6 (six) hours as needed for wheezing or shortness of breath.   Yes Historical Provider, MD  aspirin EC 81 MG tablet Take 81 mg by mouth daily.   Yes Historical Provider, MD  atorvastatin (LIPITOR) 40 MG tablet Take 1 tablet (40 mg total) by mouth daily at 6 PM. 05/27/14  Yes Darlin Coco, MD  clopidogrel (PLAVIX) 75 MG tablet TAKE 1 TABLET BY MOUTH DAILY. 05/27/14  Yes Darlin Coco, MD  lisinopril-hydrochlorothiazide (PRINZIDE,ZESTORETIC) 20-12.5 MG per tablet Take 1 tablet by mouth daily. 05/27/14  Yes Darlin Coco, MD  TOPROL XL 50 MG 24 hr tablet TAKE 1/2 TABLET BY MOUTH TWICE DAILY. 07/01/14  Yes Darlin Coco, MD  mometasone-formoterol (DULERA) 100-5 MCG/ACT AERO Inhale 2 puffs into the lungs 2 (two) times daily. Patient not taking: Reported on 05/15/2014 05/04/14   Janece Canterbury, MD     Vital Signs: BP 102/54 mmHg  Pulse 71  Temp(Src) 97.5 F (36.4 C) (Oral)  Resp 14  Ht 5\' 1"  (1.549 m)  Wt 160 lb 11.5 oz (72.9 kg)  BMI 30.38 kg/m2  SpO2 98%  Physical Exam  Cardiovascular: Normal rate.   Pulmonary/Chest:  vent  Skin:  Some red blood oozing collecting at carotid site No hematoma noted Stat PT/PTT and P2y12 ordered   Nursing note and vitals  reviewed.   Imaging: No results found.  Labs:  CBC:  Recent Labs  05/02/14 1313 05/02/14 1324 05/04/14 0703 05/12/14 1450 08/07/14 1048  WBC 6.1  --  5.4 5.6 4.3  HGB 13.8 15.0 13.7 14.0 12.6  HCT 40.0 44.0 40.6 41.2 36.9  PLT 145*  --  128* 162 128*    COAGS:  Recent Labs  05/02/14 1313 05/12/14 1450 08/07/14 1048  INR 1.02 1.02 1.01  APTT 29  --  29    BMP:  Recent Labs  05/03/14 1910 05/04/14 0703 05/12/14 1450 08/07/14 1048  NA 138 139 138 138  K 3.8 4.2 3.6 4.2  CL 101 105 100 104  CO2 31 28 29 26   GLUCOSE 123* 104* 93 102*  BUN 9 11 13 15   CALCIUM 9.3 8.8 9.1 8.9  CREATININE 0.79 0.72 0.66 0.77  GFRNONAA 82* 85* 87* >60  GFRAA >90 >90 >90 >60    LIVER FUNCTION TESTS:  Recent Labs  05/03/14 1910 08/07/14 1048  BILITOT 1.1 0.6  AST 29 20  ALT 20 17  ALKPHOS 42 40  PROT 6.9 6.6  ALBUMIN 3.6 3.5    Assessment and Plan:  R PCOM aneurysm pipeline stent placed via carotid access On vent BP high---being addressed Stat labs Dr [name] in room  Signed: Datrell Dunton A 08/12/2014, 3:52 PM   I spent a total of 15 Minutes in face to face in clinical consultation/evaluation, greater than 50% of which was counseling/coordinating care for R PCOM  aneurysm pipeline stent

## 2014-08-12 NOTE — Procedures (Signed)
S/P RT Pcom aneurysm embolzation using pipeline flow diverter visa direct RT common carotid access.

## 2014-08-12 NOTE — Transfer of Care (Signed)
Immediate Anesthesia Transfer of Care Note  Patient: Jeanne Lawson  Procedure(s) Performed: Procedure(s): RADIOLOGY WITH ANESTHESIA (N/A)  Patient Location: NICU  Anesthesia Type:General  Level of Consciousness: sedated and Patient remains intubated per anesthesia plan  Airway & Oxygen Therapy: Patient remains intubated per anesthesia plan and Patient placed on Ventilator (see vital sign flow sheet for setting)  Post-op Assessment: Report given to RN and Post -op Vital signs reviewed and stable  Post vital signs: Reviewed and stable  Last Vitals:  Filed Vitals:   08/12/14 0712  BP: 143/65  Pulse: 53  Temp: 36.4 C  Resp: 20    Complications: No apparent anesthesia complications

## 2014-08-12 NOTE — Progress Notes (Signed)
CRITICAL VALUE ALERT  Critical value received:  PTT > 200  Date of notification:  08/12/2014  Time of notification:  8295  Critical value read back:Yes.    Nurse who received alert:  Gilford Rile, RN  MD notified (1st page):  Dr [name]  Time of first page:  [phone]  MD notified (2nd page):  Time of second page:  Responding MD: Dr. [name]  Time MD responded:  [phone]

## 2014-08-13 ENCOUNTER — Inpatient Hospital Stay (HOSPITAL_COMMUNITY): Payer: Medicare Other

## 2014-08-13 ENCOUNTER — Encounter (HOSPITAL_COMMUNITY): Payer: Self-pay | Admitting: Interventional Radiology

## 2014-08-13 DIAGNOSIS — J9690 Respiratory failure, unspecified, unspecified whether with hypoxia or hypercapnia: Secondary | ICD-10-CM | POA: Insufficient documentation

## 2014-08-13 DIAGNOSIS — J9600 Acute respiratory failure, unspecified whether with hypoxia or hypercapnia: Secondary | ICD-10-CM

## 2014-08-13 DIAGNOSIS — I671 Cerebral aneurysm, nonruptured: Secondary | ICD-10-CM | POA: Insufficient documentation

## 2014-08-13 LAB — BLOOD GAS, ARTERIAL
Acid-base deficit: 1.2 mmol/L (ref 0.0–2.0)
Bicarbonate: 22.7 mEq/L (ref 20.0–24.0)
DRAWN BY: 31101
FIO2: 0.4 %
MECHVT: 380 mL
O2 Saturation: 99.3 %
PATIENT TEMPERATURE: 98.6
PEEP/CPAP: 5 cmH2O
RATE: 13 resp/min
TCO2: 23.8 mmol/L (ref 0–100)
pCO2 arterial: 36.2 mmHg (ref 35.0–45.0)
pH, Arterial: 7.414 (ref 7.350–7.450)
pO2, Arterial: 130 mmHg — ABNORMAL HIGH (ref 80.0–100.0)

## 2014-08-13 LAB — CBC WITH DIFFERENTIAL/PLATELET
BASOS ABS: 0 10*3/uL (ref 0.0–0.1)
BASOS PCT: 0 % (ref 0–1)
EOS PCT: 0 % (ref 0–5)
Eosinophils Absolute: 0 10*3/uL (ref 0.0–0.7)
HEMATOCRIT: 30.3 % — AB (ref 36.0–46.0)
HEMOGLOBIN: 10.2 g/dL — AB (ref 12.0–15.0)
Lymphocytes Relative: 10 % — ABNORMAL LOW (ref 12–46)
Lymphs Abs: 0.7 10*3/uL (ref 0.7–4.0)
MCH: 28.6 pg (ref 26.0–34.0)
MCHC: 33.7 g/dL (ref 30.0–36.0)
MCV: 84.9 fL (ref 78.0–100.0)
Monocytes Absolute: 0.5 10*3/uL (ref 0.1–1.0)
Monocytes Relative: 7 % (ref 3–12)
Neutro Abs: 5.4 10*3/uL (ref 1.7–7.7)
Neutrophils Relative %: 83 % — ABNORMAL HIGH (ref 43–77)
Platelets: 110 10*3/uL — ABNORMAL LOW (ref 150–400)
RBC: 3.57 MIL/uL — ABNORMAL LOW (ref 3.87–5.11)
RDW: 13.9 % (ref 11.5–15.5)
WBC: 6.6 10*3/uL (ref 4.0–10.5)

## 2014-08-13 LAB — PHOSPHORUS: Phosphorus: 3.7 mg/dL (ref 2.5–4.6)

## 2014-08-13 LAB — BASIC METABOLIC PANEL
ANION GAP: 4 — AB (ref 5–15)
BUN: 13 mg/dL (ref 6–20)
CALCIUM: 7.2 mg/dL — AB (ref 8.9–10.3)
CO2: 24 mmol/L (ref 22–32)
Chloride: 113 mmol/L — ABNORMAL HIGH (ref 101–111)
Creatinine, Ser: 0.69 mg/dL (ref 0.44–1.00)
GFR calc Af Amer: >60 mL/min (ref >60)
GFR calc non Af Amer: >60 mL/min (ref >60)
GLUCOSE: 127 mg/dL — AB (ref 65–99)
Potassium: 3.8 mmol/L (ref 3.5–5.1)
Sodium: 141 mmol/L (ref 135–145)

## 2014-08-13 LAB — MAGNESIUM: MAGNESIUM: 1.8 mg/dL (ref 1.7–2.4)

## 2014-08-13 LAB — HEPARIN LEVEL (UNFRACTIONATED): Heparin Unfractionated: <0.1 IU/mL — ABNORMAL LOW (ref 0.30–0.70)

## 2014-08-13 MED ORDER — SODIUM CHLORIDE 0.9 % IV BOLUS (SEPSIS)
750.0000 mL | Freq: Once | INTRAVENOUS | Status: AC
  Administered 2014-08-13: 750 mL via INTRAVENOUS

## 2014-08-13 MED ORDER — SODIUM CHLORIDE 0.9 % IV BOLUS (SEPSIS)
250.0000 mL | Freq: Once | INTRAVENOUS | Status: AC
  Administered 2014-08-13: 250 mL via INTRAVENOUS

## 2014-08-13 NOTE — Progress Notes (Signed)
PULMONARY / CRITICAL CARE MEDICINE   Name: MACKENIZE DELGADILLO MRN: [id number] DOB: 09-12-1945    ADMISSION DATE:  08/12/2014 CONSULTATION DATE:  7/20  REFERRING MD :  Oneida Alar  CHIEF COMPLAINT:  Vent management   INITIAL PRESENTATION:  69 yo female with hx CAD, COPD, HTN, previous CVA and intracranial aneurysms with previous attempt at IR femoral approach embolization or RICA aneurysm but was unsuccessful due to a very tortuous aortic arch.  She returned 7/20 for elective surgical repair with open exposure and repair of R common carotid artery to allow access for intracerebral coiling of aneurysm.  PCCM consulted for post-op vent management.    STUDIES:    SIGNIFICANT EVENTS: 7/20>> OR - open exposure and repair of R common carotid artery and intracerebral coiling of aneurysm   HISTORY OF PRESENT ILLNESS:  69 yo female with hx CAD, COPD, HTN, previous CVA and intracranial aneurysms with previous attempt at IR femoral approach embolization or RICA aneurysm but was unsuccessful due to a very tortuous aortic arch.  She returned 7/20 for elective surgical repair with open exposure and repair of R common carotid artery to allow access for intracerebral coiling of aneurysm.  PCCM consulted for post-op vent management.     SUBJECTIVE / Interval Events:  Tolerated PSV this am, awake and interacting  VITAL SIGNS: Temp:  [97.2 F (36.2 C)-98.3 F (36.8 C)] 98.2 F (36.8 C) (07/21 0400) Pulse Rate:  [52-80] 55 (07/21 1000) Resp:  [14-32] 15 (07/21 1000) BP: (79-155)/(40-95) 102/52 mmHg (07/21 1000) SpO2:  [98 %-100 %] 99 % (07/21 1000) Arterial Line BP: (85-157)/(49-86) 106/62 mmHg (07/21 0900) FiO2 (%):  [40 %] 40 % (07/21 1000) Weight:  [72.9 kg (160 lb 11.5 oz)-73.2 kg (161 lb 6 oz)] 73.2 kg (161 lb 6 oz) (07/21 0600) HEMODYNAMICS:   VENTILATOR SETTINGS: Vent Mode:  [-] PSV FiO2 (%):  [40 %] 40 % Set Rate:  [13 bmp-14 bmp] 13 bmp Vt Set:  [380 mL-500 mL] 380 mL PEEP:  [5 cmH20] 5  cmH20 Pressure Support:  [5 cmH20] 5 cmH20 Plateau Pressure:  [10 cmH20-16 cmH20] 10 cmH20 INTAKE / OUTPUT:  Intake/Output Summary (Last 24 hours) at 08/13/14 1033 Last data filed at 08/13/14 1030  Gross per 24 hour  Intake 4705.31 ml  Output   1725 ml  Net 2980.31 ml    PHYSICAL EXAMINATION: General:  Acutely ill appearing female. Neuro:  Sedated and intubated.  Withdraws to pain. HEENT:  [place]/AT, PERRL, EOM-I and MMM. Cardiovascular:  RRR, Nl S1/S2, -M/R/G. Lungs:  Coarse BS diffusely. Abdomen:  Soft, NT, ND and +BS. Musculoskeletal:  Intact. Skin:  Intact.  LABS:  CBC  Recent Labs Lab 08/07/14 1048 08/12/14 1655 08/13/14 0503  WBC 4.3 4.6 6.6  HGB 12.6 11.2* 10.2*  HCT 36.9 33.1* 30.3*  PLT 128* 106* 110*   Coag's  Recent Labs Lab 08/07/14 1048 08/12/14 1615 08/12/14 2050  APTT 29 >200* 34  INR 1.01 1.21  --    BMET  Recent Labs Lab 08/07/14 1048 08/13/14 0503  NA 138 141  K 4.2 3.8  CL 104 113*  CO2 26 24  BUN 15 13  CREATININE 0.77 0.69  GLUCOSE 102* 127*   Electrolytes  Recent Labs Lab 08/07/14 1048 08/13/14 0503  CALCIUM 8.9 7.2*  MG  --  1.8  PHOS  --  3.7   Sepsis Markers No results for input(s): LATICACIDVEN, PROCALCITON, O2SATVEN in the last 168 hours. ABG  Recent Labs Lab 08/12/14  1710 08/13/14 0232  PHART 7.331* 7.414  PCO2ART 43.7 36.2  PO2ART 118* 130*   Liver Enzymes  Recent Labs Lab 08/07/14 1048  AST 20  ALT 17  ALKPHOS 40  BILITOT 0.6  ALBUMIN 3.5   Cardiac Enzymes No results for input(s): TROPONINI, PROBNP in the last 168 hours. Glucose No results for input(s): GLUCAP in the last 168 hours.  Imaging US Soft Tissue Head/neck  08/13/2014   CLINICAL DATA:  Status post exposure and repair of right common carotid artery for access prior to intracerebral aneurysm repair. Right neck hematoma has developed at the surgical exposure site.  EXAM: ULTRASOUND OF HEAD/NECK SOFT TISSUES  TECHNIQUE: Ultrasound  examination of the head and neck soft tissues was performed in the area of clinical concern.  COMPARISON:  None.  FINDINGS: Focal relatively hypoechoic oval hematoma is noted in the subcutaneous tissues superficial to common carotid arterial exposure. This hematoma measures approximately 4.4 x 1.7 x 3.8 cm. No active blood flow is identified by Doppler within the hematoma. There is no evidence by ultrasound of arterial pseudoaneurysm or AV fistula.  IMPRESSION: Focal hematoma of the right neck superficial to the common carotid artery and measuring approximately 4 cm in greatest diameter. There is no evidence of carotid pseudoaneurysm.   Electronically Signed   By: Aletta Edouard M.D.   On: 08/13/2014 10:11   Dg Chest Port 1 View  08/13/2014   CLINICAL DATA:  Intubated patient, status post intracranial aneurysm repair  EXAM: PORTABLE CHEST - 1 VIEW  COMPARISON:  Portable chest x-ray of August 12, 2014  FINDINGS: The lungs are well-expanded. Minimal subsegmental atelectasis is noted at the lung bases. The cardiac silhouette remains enlarged. The central pulmonary vascularity remains prominent. The endotracheal tube tip projects 5.4 cm above the carina.  IMPRESSION: Slight interval increase in basilar airspace opacities greatest on the left consistent with atelectasis or pneumonia. Stable cardiomegaly and central pulmonary vascular congestion.   Electronically Signed   By: David  Martinique M.D.   On: 08/13/2014 07:35   Dg Chest Port 1 View  08/12/2014   CLINICAL DATA:  69 year old female with a history of narrow intervention and direct carotid access. Status post intracranial aneurysm repair  EXAM: PORTABLE CHEST - 1 VIEW  COMPARISON:  Plain film 04/22/2014  FINDINGS: There has been interval placement of endotracheal tube. The tube terminates just above the carina, perhaps 1.2 cm.  Lung volumes adequate. Bilateral streaky opacities with no confluent airspace disease. No pneumothorax. No pleural effusion.   Atherosclerotic calcification of the aortic arch.  No pulmonary vascular congestion.  Cardiomediastinal silhouette is unchanged.  No displaced fracture.  IMPRESSION: Interval placement of endotracheal tube, which terminates just above the carina. The tube may be withdrawn for better position.  Streaky opacities of the bilateral lungs, likely atelectasis.  These results will be called to the ordering clinician or representative by the Radiologist Assistant, and communication documented in the PACS or zVision Dashboard.  Signed,  Dulcy Fanny. Earleen Newport, DO  [facility]  [facility]   Electronically Signed   By: Corrie Mckusick D.O.   On: 08/12/2014 15:55   Dg Abd Portable 1v  08/13/2014   CLINICAL DATA:  Orogastric tube placement.  Initial encounter.  EXAM: PORTABLE ABDOMEN - 1 VIEW  COMPARISON:  Chest radiograph same date.  FINDINGS: 0901 hr. Enteric tube projects into the right mid abdomen, likely within the distal stomach or proximal duodenum. The visualized bowel gas pattern is normal. There  is mild breathing artifact. Scattered vascular calcifications are noted.  IMPRESSION: Enteric tube projects into the mid mid right abdomen, likely in the distal stomach or proximal duodenum.   Electronically Signed   By: Richardean Sale M.D.   On: 08/13/2014 09:11     ASSESSMENT / PLAN:  PULMONARY OETT 7/20>>> Post op vent dependence Hx COPD   P:   Vent support - 8cc/kg  I have reviewd US neck, discussed case w Dr Oneida Alar. She meets criteria for extubation, now has a cuff leak. Will proceed with extubation this am F/u CXR  F/u ABG PRN BD - dulera listed as home med but pt reportedly not taking    CARDIOVASCULAR Hx HTN  CAD P:  Restarted asa, plavix this am Hold home anti-HTN  Assess need to resume home B blocker (toprol xl 25mg  bid at home)   RENAL Oliguria P:   F/u chem and UOP  GASTROINTESTINAL No active issue  P:   PPI  NPO and assess swallow for Po  when extubated  HEMATOLOGIC No active issue  P:  SCD's  F/u CBC  Low dose Heparin gtt per neuro post embolization, adjust per IR goals  INFECTIOUS No active issue  P:   Monitor wbc, fever curve off abx   ENDOCRINE No active issue  P:   Monitor glucose on chem   NEUROLOGIC Intracerebral aneurysm - s/p coiling  Exposure/repair of carotid  P:   RASS goal: -1 to 0 Post op care per neuro, VVS Propofol gtt wean off Daily WUA    FAMILY  - Updates: discussed with the patient's daughter this am at bedside  - Inter-disciplinary family meet or Palliative Care meeting due by:  7/27  Independent CC time 42 minutes   Baltazar Apo, MD, PhD 08/13/2014, 11:29 AM [address] [facility] [phone] or if no answer [phone]

## 2014-08-13 NOTE — Progress Notes (Addendum)
      Patient intubated Arm restraints in place Moves all extremities Right neck incision clean and dry, no active bleeding. No frank hematoma, positive ecchymosis  POD# 1 Exposure and repair of right common carotid artery Intubated stable vitals  COLLINS, EMMA MAUREEN PA-C   Right neck incision clean Following commands moving extremites Off heparin No further bleeding Hopefully extubate this morning  Ruta Hinds, MD [facility] [place] Office: [phone] Pager: [phone]

## 2014-08-13 NOTE — Progress Notes (Addendum)
Urine output 185 ml between 8PM to [place] notified. Orders received.   5:30AM - Elink notified that patient only produced 45ml of urine post 765ml bolus. Bladder Scan showed 95mls. Awaiting orders.

## 2014-08-13 NOTE — Progress Notes (Signed)
[place] Progress Note Patient Name: Jeanne Lawson DOB: Nov 20, 1945 MRN: [id number]   Date of Service  08/13/2014  HPI/Events of Note  oliguria  eICU Interventions  Ns bolus given     Intervention Category Intermediate Interventions: Oliguria - evaluation and management  Asencion Noble 08/13/2014, 3:29 AM

## 2014-08-13 NOTE — Procedures (Signed)
Extubation Procedure Note  Patient Details:   Name: Jeanne Lawson DOB: August 09, 1945 MRN: [id number]   Airway Documentation:     Evaluation  O2 sats: stable throughout Complications: No apparent complications Patient did tolerate procedure well. Bilateral Breath Sounds: Clear, Diminished Suctioning: Airway, Oral Yes  Pt extubated per MD order. Placed on 2L [place], sat 98%. Positiive cuff leak. BBS cl/dim. Pt tolerated well. No complications noted. Vallery Sa 08/13/2014, 11:41 AM

## 2014-08-13 NOTE — Progress Notes (Addendum)
MD Deveshar notified of pt's APTT. Orders to restart Heparin drip at 600 units/hr. Will continue to monitor.

## 2014-08-13 NOTE — Care Management Note (Signed)
Case Management Note  Patient Details  Name: ZOII FLORER MRN: [id number] Date of Birth: 04-27-1945  Subjective/Objective:   Pt s/p exposure and repair of Rt common carotid artery and intracerebral coiling of aneurysm on 08/12/14.  PTA, pt independent of ADLS; has supportive family.                 Action/Plan: Will follow for discharge planning as pt progresses.    Expected Discharge Date:                  Expected Discharge Plan:  [place]  In-House Referral:     Discharge planning Services  CM Consult  Post Acute Care Choice:    Choice offered to:     DME Arranged:    DME Agency:     HH Arranged:    [place] Agency:     Status of Service:  In process, will continue to follow  Medicare Important Message Given:    Date Medicare IM Given:    Medicare IM give by:    Date Additional Medicare IM Given:    Additional Medicare Important Message give by:     If discussed at [place] of Stay Meetings, dates discussed:    Additional Comments:  Reinaldo Raddle, RN, BSN  Trauma/Neuro ICU Case Manager [phone]

## 2014-08-13 NOTE — Progress Notes (Signed)
Referring Physician(s): DR Jeanne Lawson Dr Jeanne Lawson  Chief Complaint:  R PCOM aneurysm  Subjective:  R PCOM aneurysm pipeline stent placed 7/20 in IR with Dr Jeanne Lawson Via Rt carotid artery access---gained in OR prior to procedure with Dr Jeanne Lawson Pt intubated/on vent Has been seen by Vascular PA this am---no hematoma Pt is moving all 4s; nods yes/no appropriately I have reported to Dr Jeanne Lawson OG tube ordered for ASA/Plavix administration US neck ordered to evaluate for hematoma  Allergies: Dilaudid; Codeine; Hydromorphone hcl; Latex; and Sulfa drugs cross reactors  Medications: Prior to Admission medications   Medication Sig Start Date End Date Taking? Authorizing Provider  albuterol (PROVENTIL HFA;VENTOLIN HFA) 108 (90 BASE) MCG/ACT inhaler Inhale 1-2 puffs into the lungs every 6 (six) hours as needed for wheezing or shortness of breath.   Yes Historical Provider, MD  aspirin EC 81 MG tablet Take 81 mg by mouth daily.   Yes Historical Provider, MD  atorvastatin (LIPITOR) 40 MG tablet Take 1 tablet (40 mg total) by mouth daily at 6 PM. 05/27/14  Yes Jeanne Coco, MD  clopidogrel (PLAVIX) 75 MG tablet TAKE 1 TABLET BY MOUTH DAILY. 05/27/14  Yes Jeanne Coco, MD  lisinopril-hydrochlorothiazide (PRINZIDE,ZESTORETIC) 20-12.5 MG per tablet Take 1 tablet by mouth daily. 05/27/14  Yes Jeanne Coco, MD  TOPROL XL 50 MG 24 hr tablet TAKE 1/2 TABLET BY MOUTH TWICE DAILY. 07/01/14  Yes Jeanne Coco, MD  mometasone-formoterol (DULERA) 100-5 MCG/ACT AERO Inhale 2 puffs into the lungs 2 (two) times daily. Patient not taking: Reported on 05/15/2014 05/04/14   Jeanne Canterbury, MD     Vital Signs: BP 115/66 mmHg  Pulse 74  Temp(Src) 98.2 F (36.8 C) (Oral)  Resp 19  Ht 5\' 1"  (1.549 m)  Wt 161 lb 6 oz (73.2 kg)  BMI 30.51 kg/m2  SpO2 100%  Physical Exam  Neck:  Rt neck with definite ecchymotic area No hematoma noted NT to pt Good pulse  Pulmonary/Chest:  Intubated/vent    Abdominal: Soft. Bowel sounds are normal.  Musculoskeletal: Normal range of motion.  Moves all 4s to command  Neurological:  Answers yes/no with nods All appropriately  Skin: Skin is warm.  Nursing note and vitals reviewed.   Imaging: Dg Chest Port 1 View  08/13/2014   CLINICAL DATA:  Intubated patient, status post intracranial aneurysm repair  EXAM: PORTABLE CHEST - 1 VIEW  COMPARISON:  Portable chest x-ray of August 12, 2014  FINDINGS: The lungs are well-expanded. Minimal subsegmental atelectasis is noted at the lung bases. The cardiac silhouette remains enlarged. The central pulmonary vascularity remains prominent. The endotracheal tube tip projects 5.4 cm above the carina.  IMPRESSION: Slight interval increase in basilar airspace opacities greatest on the left consistent with atelectasis or pneumonia. Stable cardiomegaly and central pulmonary vascular congestion.   Electronically Signed   By: Jeanne  Lawson M.D.   On: 08/13/2014 07:35   Dg Chest Port 1 View  08/12/2014   CLINICAL DATA:  69 year old female with a history of narrow intervention and direct carotid access. Status post intracranial aneurysm repair  EXAM: PORTABLE CHEST - 1 VIEW  COMPARISON:  Plain film 04/22/2014  FINDINGS: There has been interval placement of endotracheal tube. The tube terminates just above the carina, perhaps 1.2 cm.  Lung volumes adequate. Bilateral streaky opacities with no confluent airspace disease. No pneumothorax. No pleural effusion.  Atherosclerotic calcification of the aortic arch.  No pulmonary vascular congestion.  Cardiomediastinal silhouette is unchanged.  No displaced fracture.  IMPRESSION: Interval placement of endotracheal tube, which terminates just above the carina. The tube may be withdrawn for better position.  Streaky opacities of the bilateral lungs, likely atelectasis.  These results will be called to the ordering clinician or representative by the Radiologist Assistant, and communication  documented in the PACS or zVision Dashboard.  Signed,  Jeanne Lawson. Jeanne Newport, DO  [facility]  [facility]   Electronically Signed   By: Jeanne Lawson D.O.   On: 08/12/2014 15:55    Labs:  CBC:  Recent Labs  05/12/14 1450 08/07/14 1048 08/12/14 1655 08/13/14 0503  WBC 5.6 4.3 4.6 6.6  HGB 14.0 12.6 11.2* 10.2*  HCT 41.2 36.9 33.1* 30.3*  PLT 162 128* 106* 110*    COAGS:  Recent Labs  05/02/14 1313 05/12/14 1450 08/07/14 1048 08/12/14 1615 08/12/14 2050  INR 1.02 1.02 1.01 1.21  --   APTT 29  --  29 >200* 34    BMP:  Recent Labs  05/04/14 0703 05/12/14 1450 08/07/14 1048 08/13/14 0503  NA 139 138 138 141  K 4.2 3.6 4.2 3.8  CL 105 100 104 113*  CO2 28 29 26 24   GLUCOSE 104* 93 102* 127*  BUN 11 13 15 13   CALCIUM 8.8 9.1 8.9 7.2*  CREATININE 0.72 0.66 0.77 0.69  GFRNONAA 85* 87* >60 >60  GFRAA >90 >90 >60 >60    LIVER FUNCTION TESTS:  Recent Labs  05/03/14 1910 08/07/14 1048  BILITOT 1.1 0.6  AST 29 20  ALT 20 17  ALKPHOS 42 40  PROT 6.9 6.6  ALBUMIN 3.6 3.5    Assessment and Plan:  R PCOM aneurysm pipeline stent placed 7/20 in IR Doing well this am--stable---moving all 4s Intubated/vent OG to be placed US neck- portable Dr Jeanne Lawson aware of report Extubation if CCM feels appropriate  Signed: Dajuana Lawson A 08/13/2014, 8:24 AM   I spent a total of 25 Minutes in face to face in clinical consultation/evaluation, greater than 50% of which was counseling/coordinating care for R ACOM aneurysm stent

## 2014-08-13 NOTE — Evaluation (Signed)
Physical Therapy Evaluation Patient Details Name: Jeanne Lawson MRN: [id number] DOB: Jul 27, 1945 Today's Date: 08/13/2014   History of Present Illness  Patient is a 69 y.o. year old female who presents for evaluation of her carotid arteries. She is referred by Dr. [name] for consideration of open exposure of her common carotid artery in order to treat intracranial aneurysms with coiling/stenting.in the near future  Clinical Impression  Pt admitted with/for above procedure.  Pt currently limited functionally due to the problems listed. ( See problems list.)   Pt will benefit from PT to maximize function and safety in order to get ready for next venue listed below.     Follow Up Recommendations SNF    Equipment Recommendations  None recommended by PT    Recommendations for Other Services       Precautions / Restrictions Precautions Precautions: Fall      Mobility  Bed Mobility Overal bed mobility: Needs Assistance Bed Mobility: Supine to Sit     Supine to sit: Min assist     General bed mobility comments: minimal truncal stability assist  Transfers Overall transfer level: Needs assistance Equipment used: 1 person hand held assist Transfers: Sit to/from Stand Sit to Stand: Min assist         General transfer comment: cues for safe hand placement  Ambulation/Gait Ambulation/Gait assistance: Min assist Ambulation Distance (Feet): 20 Feet Assistive device: 1 person hand held assist Gait Pattern/deviations: Step-through pattern Gait velocity: slower   General Gait Details: short tentative, mildly paretic gait  Stairs            Wheelchair Mobility    Modified Rankin (Stroke Patients Only)       Balance Overall balance assessment: Needs assistance Sitting-balance support: No upper extremity supported Sitting balance-Leahy Scale: Fair     Standing balance support: Single extremity supported Standing balance-Leahy Scale: Poor                                Pertinent Vitals/Pain Pain Assessment: Faces Faces Pain Scale: Hurts little more Pain Location: neck Pain Descriptors / Indicators: Grimacing Pain Intervention(s): Monitored during session    Home Living Family/patient expects to be discharged to:: Private residence Living Arrangements: Alone Available Help at Discharge: Family;Friend(s);Available PRN/intermittently Type of Home: House Home Access: Stairs to enter Entrance Stairs-Rails: Right;Left;Can reach both Entrance Stairs-Number of Steps: 2 Home Layout: One level Home Equipment: Cane - single point;Tub bench;Walker - 2 wheels      Prior Function Level of Independence: Independent with assistive device(s)         Comments: uses single point cane and furniture surfs     Hand Dominance   Dominant Hand: Right    Extremity/Trunk Assessment   Upper Extremity Assessment: Defer to OT evaluation           Lower Extremity Assessment: Overall WFL for tasks assessed;Generalized weakness         Communication   Communication: Expressive difficulties  Cognition Arousal/Alertness: Lethargic;Awake/alert Behavior During Therapy: Restless;WFL for tasks assessed/performed Overall Cognitive Status: Within Functional Limits for tasks assessed (trouble focusing on task)                      General Comments General comments (skin integrity, edema, etc.): pt having trouble staying on task.    Exercises        Assessment/Plan    PT Assessment Patient needs continued PT services  PT Diagnosis Abnormality of gait;Generalized weakness   PT Problem List Decreased strength;Decreased activity tolerance;Decreased balance;Decreased mobility;Pain  PT Treatment Interventions DME instruction;Stair training;Gait training;Functional mobility training;Therapeutic activities;Balance training;Patient/family education   PT Goals (Current goals can be found in the Care Plan section) Acute Rehab PT  Goals Patient Stated Goal: back to work PT Goal Formulation: With patient Time For Goal Achievement: 08/27/14 Potential to Achieve Goals: Good    Frequency Min 3X/week   Barriers to discharge        Co-evaluation               End of Session   Activity Tolerance: Patient tolerated treatment well;Patient limited by fatigue Patient left: in chair;with call bell/phone within reach Nurse Communication: Mobility status         Time: 6256-3893 PT Time Calculation (min) (ACUTE ONLY): 28 min   Charges:   PT Evaluation $Initial PT Evaluation Tier I: 1 Procedure PT Treatments $Gait Training: 8-22 mins   PT G Codes:        Sanjuanita Condrey, Tessie Fass 08/13/2014, 4:18 PM 08/13/2014  Donnella Sham, PT [phone]  (pager)

## 2014-08-13 NOTE — Anesthesia Postprocedure Evaluation (Signed)
  Anesthesia Post-op Note  Patient: Jeanne Lawson  Procedure(s) Performed: Procedure(s): RADIOLOGY WITH ANESTHESIA (N/A)  Patient Location: NICU  Anesthesia Type:General  Level of Consciousness: sedated and Patient remains intubated per anesthesia plan  Airway and Oxygen Therapy: Patient remains intubated per anesthesia plan and Patient placed on Ventilator (see vital sign flow sheet for setting)  Post-op Pain: none  Post-op Assessment: Post-op Vital signs reviewed, Patient's Cardiovascular Status Stable, Respiratory Function Stable, Patent Airway and Pain level controlled LLE Motor Response: Non-purposeful movement   RLE Motor Response: Non-purposeful movement        Post-op Vital Signs: stable  Last Vitals:  Filed Vitals:   08/13/14 0630  BP: 108/64  Pulse: 73  Temp:   Resp: 22    Complications: No apparent anesthesia complications

## 2014-08-13 NOTE — Progress Notes (Signed)
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 Authorizing Provider  albuterol (PROVENTIL HFA;VENTOLIN HFA) 108 (90 BASE) MCG/ACT inhaler Inhale 1-2 puffs into the lungs every 6 (six) hours as needed for wheezing or shortness of breath.   Yes Historical Provider, MD  aspirin EC 81 MG tablet Take 81 mg by mouth daily.   Yes Historical Provider, MD  atorvastatin (LIPITOR) 40 MG tablet Take 1 tablet (40 mg total) by mouth daily at 6 PM. 05/27/14  Yes Darlin Coco, MD  clopidogrel (PLAVIX) 75 MG tablet TAKE 1 TABLET BY MOUTH DAILY. 05/27/14  Yes Darlin Coco, MD  lisinopril-hydrochlorothiazide (PRINZIDE,ZESTORETIC) 20-12.5 MG per tablet Take 1 tablet by mouth daily. 05/27/14  Yes Darlin Coco, MD  TOPROL XL 50 MG 24 hr tablet TAKE 1/2 TABLET BY MOUTH TWICE DAILY. 07/01/14  Yes Darlin Coco, MD  mometasone-formoterol (DULERA) 100-5 MCG/ACT AERO Inhale 2 puffs into the lungs 2 (two) times daily. Patient not taking: Reported on 05/15/2014 05/04/14   Janece Canterbury, MD     Vital Signs: BP 116/51 mmHg  Pulse 69  Temp(Src) 98 F (36.7 C) (Oral)  Resp 22  Ht 5\' 1"  (1.549 m)  Wt 161 lb 6 oz (73.2 kg)  BMI 30.51 kg/m2  SpO2 100%  Physical Exam  Constitutional: She is oriented to person, place, and time.  HENT:  Throat clear  Eyes: EOM are normal.  Neck:  ecchymotic area at Rt carotid site NT No bleeding Clean New dressing to be placed   Cardiovascular: Normal rate.   Musculoskeletal: Normal range of motion.  Left sl weaker than Rt Both  arm/leg (this is as prior to procedure)  Neurological: She is alert and oriented to person, place, and time.  speech is slow Voice hoarse Answers all questions appropriately  Skin: Skin is warm.  Psychiatric: She has a normal mood and affect. Thought content normal.  Nursing note and vitals reviewed.   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 Right neck hematoma has developed at the surgical exposure site.  EXAM: ULTRASOUND OF HEAD/NECK SOFT TISSUES  TECHNIQUE: Ultrasound examination of the head and neck soft tissues was performed in the area of clinical concern.  COMPARISON:  None.  FINDINGS: Focal relatively hypoechoic oval hematoma is noted in the subcutaneous tissues superficial to common carotid arterial exposure. This hematoma measures approximately 4.4 x 1.7 x 3.8 cm. No active blood flow is identified by Doppler within the hematoma. There is no evidence by ultrasound of arterial pseudoaneurysm or AV fistula.  IMPRESSION: Focal hematoma of the right neck superficial to the common carotid artery and measuring approximately 4 cm in greatest diameter. There is no evidence of carotid pseudoaneurysm.   Electronically Signed   By: Aletta Edouard M.D.   On: 08/13/2014 10:11   Dg Chest Port 1 View  08/13/2014   CLINICAL DATA:  Intubated patient, status post intracranial aneurysm repair  EXAM: PORTABLE CHEST - 1 VIEW  COMPARISON:  Portable chest x-ray of  August 12, 2014  FINDINGS: The lungs are well-expanded. Minimal subsegmental atelectasis is noted at the lung bases. The cardiac silhouette remains enlarged. The central pulmonary vascularity remains prominent. The endotracheal tube tip projects 5.4 cm above the carina.  IMPRESSION: Slight interval increase in basilar airspace opacities greatest on the left consistent with atelectasis or pneumonia. Stable cardiomegaly  and central pulmonary vascular congestion.   Electronically Signed   By: David  Martinique M.D.   On: 08/13/2014 07:35   Dg Chest Port 1 View  08/12/2014   CLINICAL DATA:  69 year old female with a history of narrow intervention and direct carotid access. Status post intracranial aneurysm repair  EXAM: PORTABLE CHEST - 1 VIEW  COMPARISON:  Plain film 04/22/2014  FINDINGS: There has been interval placement of endotracheal tube. The tube terminates just above the carina, perhaps 1.2 cm.  Lung volumes adequate. Bilateral streaky opacities with no confluent airspace disease. No pneumothorax. No pleural effusion.  Atherosclerotic calcification of the aortic arch.  No pulmonary vascular congestion.  Cardiomediastinal silhouette is unchanged.  No displaced fracture.  IMPRESSION: Interval placement of endotracheal tube, which terminates just above the carina. The tube may be withdrawn for better position.  Streaky opacities of the bilateral lungs, likely atelectasis.  These results will be called to the ordering clinician or representative by the Radiologist Assistant, and communication documented in the PACS or zVision Dashboard.  Signed,  Dulcy Fanny. Earleen Newport, DO  [facility]  [facility]   Electronically Signed   By: Corrie Mckusick D.O.   On: 08/12/2014 15:55   Dg Abd Portable 1v  08/13/2014   CLINICAL DATA:  Orogastric tube placement.  Initial encounter.  EXAM: PORTABLE ABDOMEN - 1 VIEW  COMPARISON:  Chest radiograph same date.  FINDINGS: 0901 hr. Enteric tube projects into the right mid abdomen, likely within the distal stomach or proximal duodenum. The visualized bowel gas pattern is normal. There is mild breathing artifact. Scattered vascular calcifications are noted.  IMPRESSION: Enteric tube projects into the mid mid right abdomen, likely in the distal stomach or proximal duodenum.   Electronically Signed   By: Richardean Sale M.D.   On: 08/13/2014 09:11     Labs:  CBC:  Recent Labs  05/12/14 1450 08/07/14 1048 08/12/14 1655 08/13/14 0503  WBC 5.6 4.3 4.6 6.6  HGB 14.0 12.6 11.2* 10.2*  HCT 41.2 36.9 33.1* 30.3*  PLT 162 128* 106* 110*    COAGS:  Recent Labs  05/02/14 1313 05/12/14 1450 08/07/14 1048 08/12/14 1615 08/12/14 2050  INR 1.02 1.02 1.01 1.21  --   APTT 29  --  29 >200* 34    BMP:  Recent Labs  05/04/14 0703 05/12/14 1450 08/07/14 1048 08/13/14 0503  NA 139 138 138 141  K 4.2 3.6 4.2 3.8  CL 105 100 104 113*  CO2 28 29 26 24   GLUCOSE 104* 93 102* 127*  BUN 11 13 15 13   CALCIUM 8.8 9.1 8.9 7.2*  CREATININE 0.72 0.66 0.77 0.69  GFRNONAA 85* 87* >60 >60  GFRAA >90 >90 >60 >60    LIVER FUNCTION TESTS:  Recent Labs  05/03/14 1910 08/07/14 1048  BILITOT 1.1 0.6  AST 29 20  ALT 20 17  ALKPHOS 42 40  PROT 6.9 6.6  ALBUMIN 3.6 3.5    Assessment and Plan:  Physical therapy in room now Plan to sit up in chair Speech therapy for swallow eval asap Dr [name] has seen and examined pt  Signed: Darshana Curnutt A 08/13/2014, 3:19 PM   I spent a total of 15 Minutes in face to face in clinical consultation/evaluation, greater than 50% of which was counseling/coordinating care for R PCOM aneurysm

## 2014-08-14 ENCOUNTER — Inpatient Hospital Stay (HOSPITAL_COMMUNITY): Payer: Medicare Other

## 2014-08-14 LAB — BASIC METABOLIC PANEL
ANION GAP: 7 (ref 5–15)
BUN: 12 mg/dL (ref 6–20)
CHLORIDE: 112 mmol/L — AB (ref 101–111)
CO2: 25 mmol/L (ref 22–32)
Calcium: 7.3 mg/dL — ABNORMAL LOW (ref 8.9–10.3)
Creatinine, Ser: 0.69 mg/dL (ref 0.44–1.00)
GFR calc Af Amer: >60 mL/min (ref >60)
GFR calc non Af Amer: >60 mL/min (ref >60)
GLUCOSE: 97 mg/dL (ref 65–99)
Potassium: 3.4 mmol/L — ABNORMAL LOW (ref 3.5–5.1)
Sodium: 144 mmol/L (ref 135–145)

## 2014-08-14 LAB — CBC
HEMATOCRIT: 27.9 % — AB (ref 36.0–46.0)
Hemoglobin: 9.1 g/dL — ABNORMAL LOW (ref 12.0–15.0)
MCH: 28.3 pg (ref 26.0–34.0)
MCHC: 32.6 g/dL (ref 30.0–36.0)
MCV: 86.9 fL (ref 78.0–100.0)
Platelets: 102 10*3/uL — ABNORMAL LOW (ref 150–400)
RBC: 3.21 MIL/uL — ABNORMAL LOW (ref 3.87–5.11)
RDW: 14.2 % (ref 11.5–15.5)
WBC: 4.8 10*3/uL (ref 4.0–10.5)

## 2014-08-14 LAB — PLATELET INHIBITION P2Y12: Platelet Function  P2Y12: 128 [PRU] — ABNORMAL LOW (ref 194–418)

## 2014-08-14 MED ORDER — POTASSIUM CHLORIDE 10 MEQ/100ML IV SOLN
10.0000 meq | INTRAVENOUS | Status: AC
  Administered 2014-08-14 (×4): 10 meq via INTRAVENOUS
  Filled 2014-08-14 (×4): qty 100

## 2014-08-14 MED ORDER — LISINOPRIL 20 MG PO TABS
20.0000 mg | ORAL_TABLET | Freq: Every day | ORAL | Status: DC
  Administered 2014-08-14 – 2014-08-17 (×4): 20 mg via ORAL
  Filled 2014-08-14 (×4): qty 1

## 2014-08-14 MED ORDER — ASPIRIN EC 81 MG PO TBEC: 81.0000 mg | DELAYED_RELEASE_TABLET | Freq: Every day | ORAL | Status: DC

## 2014-08-14 MED ORDER — ATORVASTATIN CALCIUM 40 MG PO TABS
40.0000 mg | ORAL_TABLET | Freq: Every day | ORAL | Status: DC
  Administered 2014-08-14 – 2014-08-15 (×2): 40 mg via ORAL
  Filled 2014-08-14 (×4): qty 1

## 2014-08-14 MED ORDER — HYDROCHLOROTHIAZIDE 12.5 MG PO CAPS
12.5000 mg | ORAL_CAPSULE | Freq: Every day | ORAL | Status: DC
  Administered 2014-08-14 – 2014-08-17 (×3): 12.5 mg via ORAL
  Filled 2014-08-14 (×4): qty 1

## 2014-08-14 MED ORDER — CLOPIDOGREL BISULFATE 75 MG PO TABS: 75.0000 mg | ORAL_TABLET | Freq: Every day | ORAL | Status: DC

## 2014-08-14 MED ORDER — ALBUTEROL SULFATE (2.5 MG/3ML) 0.083% IN NEBU
2.5000 mg | INHALATION_SOLUTION | Freq: Four times a day (QID) | RESPIRATORY_TRACT | Status: DC | PRN
Start: 2014-08-14 — End: 2014-08-17

## 2014-08-14 MED ORDER — METOPROLOL SUCCINATE ER 25 MG PO TB24
25.0000 mg | ORAL_TABLET | Freq: Two times a day (BID) | ORAL | Status: DC
  Administered 2014-08-14 – 2014-08-17 (×6): 25 mg via ORAL
  Filled 2014-08-14 (×9): qty 1

## 2014-08-14 MED ORDER — LISINOPRIL-HYDROCHLOROTHIAZIDE 20-12.5 MG PO TABS: 1.0000 | ORAL_TABLET | Freq: Every day | ORAL | Status: DC

## 2014-08-14 NOTE — Progress Notes (Signed)
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 Authorizing Provider  albuterol (PROVENTIL HFA;VENTOLIN HFA) 108 (90 BASE) MCG/ACT inhaler Inhale 1-2 puffs into the lungs every 6 (six) hours as needed for wheezing or shortness of breath.   Yes Historical Provider, MD  aspirin EC 81 MG tablet Take 81 mg by mouth daily.   Yes Historical Provider, MD  atorvastatin (LIPITOR) 40 MG tablet Take 1 tablet (40 mg total) by mouth daily at 6 PM. 05/27/14  Yes Darlin Coco, MD  clopidogrel (PLAVIX) 75 MG tablet TAKE 1 TABLET BY MOUTH DAILY. 05/27/14  Yes Darlin Coco, MD  lisinopril-hydrochlorothiazide (PRINZIDE,ZESTORETIC) 20-12.5 MG per tablet Take 1 tablet by mouth daily. 05/27/14  Yes Darlin Coco, MD  TOPROL XL 50 MG 24 hr tablet TAKE 1/2 TABLET BY MOUTH TWICE DAILY. 07/01/14  Yes Darlin Coco, MD  mometasone-formoterol (DULERA) 100-5 MCG/ACT AERO Inhale 2 puffs into the lungs 2 (two) times daily. Patient not taking: Reported on 05/15/2014 05/04/14   Janece Canterbury, MD     Vital Signs: BP 128/77 mmHg  Pulse 88  Temp(Src) 99 F (37.2 C) (Oral)  Resp 29  Ht 5\' 1"  (1.549 m)  Wt 161 lb 6 oz (73.2 kg)  BMI 30.51 kg/m2  SpO2 98%  Physical Exam  Constitutional: She is oriented to person, place, and time.  Eyes: EOM are normal.  Neck:  Rt neck site is ecchymotic; no hematoma palpated NT no sign of infection  Musculoskeletal: Normal range of motion.  Moving all 4s Lt sl  weaker than Rt Feet ++ edema; L sl worse than Rt (common for pt--altho more swelling than normal)  Neurological: She is alert and oriented to person, place, and time.  Skin: Skin is warm and dry.  Psychiatric: She has a normal mood and affect. Her behavior is normal. Judgment and thought content normal.  Nursing note and vitals reviewed.   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 Right neck hematoma has developed at the surgical exposure site.  EXAM: ULTRASOUND OF HEAD/NECK SOFT TISSUES  TECHNIQUE: Ultrasound examination of the head and neck soft tissues was performed in the area of clinical concern.  COMPARISON:  None.  FINDINGS: Focal relatively hypoechoic oval hematoma is noted in the subcutaneous tissues superficial to common carotid arterial exposure. This hematoma measures approximately 4.4 x 1.7 x 3.8 cm. No active blood flow is identified by Doppler within the hematoma. There is no evidence by ultrasound of arterial pseudoaneurysm or AV fistula.  IMPRESSION: Focal hematoma of the right neck superficial to the common carotid artery and measuring approximately 4 cm in greatest diameter. There is no evidence of carotid pseudoaneurysm.   Electronically Signed   By: Aletta Edouard M.D.   On: 08/13/2014 10:11   Dg Chest Port 1 View  08/13/2014   CLINICAL DATA:  Intubated patient, status post intracranial aneurysm repair  EXAM: PORTABLE CHEST - 1  VIEW  COMPARISON:  Portable chest x-ray of August 12, 2014  FINDINGS: The lungs are well-expanded. Minimal subsegmental atelectasis is noted at the lung bases. The cardiac silhouette remains enlarged. The central pulmonary vascularity remains prominent. The endotracheal tube tip projects 5.4 cm above the carina.  IMPRESSION: Slight interval increase in basilar airspace opacities greatest on the left consistent with  atelectasis or pneumonia. Stable cardiomegaly and central pulmonary vascular congestion.   Electronically Signed   By: David  Martinique M.D.   On: 08/13/2014 07:35   Dg Chest Port 1 View  08/12/2014   CLINICAL DATA:  69 year old female with a history of narrow intervention and direct carotid access. Status post intracranial aneurysm repair  EXAM: PORTABLE CHEST - 1 VIEW  COMPARISON:  Plain film 04/22/2014  FINDINGS: There has been interval placement of endotracheal tube. The tube terminates just above the carina, perhaps 1.2 cm.  Lung volumes adequate. Bilateral streaky opacities with no confluent airspace disease. No pneumothorax. No pleural effusion.  Atherosclerotic calcification of the aortic arch.  No pulmonary vascular congestion.  Cardiomediastinal silhouette is unchanged.  No displaced fracture.  IMPRESSION: Interval placement of endotracheal tube, which terminates just above the carina. The tube may be withdrawn for better position.  Streaky opacities of the bilateral lungs, likely atelectasis.  These results will be called to the ordering clinician or representative by the Radiologist Assistant, and communication documented in the PACS or zVision Dashboard.  Signed,  Dulcy Fanny. Earleen Newport, DO  [facility]  [facility]   Electronically Signed   By: Corrie Mckusick D.O.   On: 08/12/2014 15:55   Dg Abd Portable 1v  08/13/2014   CLINICAL DATA:  Orogastric tube placement.  Initial encounter.  EXAM: PORTABLE ABDOMEN - 1 VIEW  COMPARISON:  Chest radiograph same date.  FINDINGS: 0901 hr. Enteric tube projects into the right mid abdomen, likely within the distal stomach or proximal duodenum. The visualized bowel gas pattern is normal. There is mild breathing artifact. Scattered vascular calcifications are noted.  IMPRESSION: Enteric tube projects into the mid mid right abdomen, likely in the distal stomach or proximal duodenum.   Electronically Signed   By: Richardean Sale  M.D.   On: 08/13/2014 09:11    Labs:  CBC:  Recent Labs  08/07/14 1048 08/12/14 1655 08/13/14 0503 08/14/14 0237  WBC 4.3 4.6 6.6 4.8  HGB 12.6 11.2* 10.2* 9.1*  HCT 36.9 33.1* 30.3* 27.9*  PLT 128* 106* 110* 102*    COAGS:  Recent Labs  05/02/14 1313 05/12/14 1450 08/07/14 1048 08/12/14 1615 08/12/14 2050  INR 1.02 1.02 1.01 1.21  --   APTT 29  --  29 >200* 34    BMP:  Recent Labs  05/12/14 1450 08/07/14 1048 08/13/14 0503 08/14/14 0237  NA 138 138 141 144  K 3.6 4.2 3.8 3.4*  CL 100 104 113* 112*  CO2 29 26 24 25   GLUCOSE 93 102* 127* 97  BUN 13 15 13 12   CALCIUM 9.1 8.9 7.2* 7.3*  CREATININE 0.66 0.77 0.69 0.69  GFRNONAA 87* >60 >60 >60  GFRAA >90 >60 >60 >60    LIVER FUNCTION TESTS:  Recent Labs  05/03/14 1910 08/07/14 1048  BILITOT 1.1 0.6  AST 29 20  ALT 20 17  ALKPHOS 42 40  PROT 6.9 6.6  ALBUMIN 3.6 3.5    Assessment and Plan:  R PCOM aneurysm pipeline stent 7/20 Better daily Plan dc art line Advance fluids then diet Will  start home meds today Discussed with Dr [name]  Signed: Monia Sabal A 08/14/2014, 7:19 AM   I spent a total of 15 Minutes in face to face in clinical consultation/evaluation, greater than 50% of which was counseling/coordinating care for R PCOM aneurysm embo

## 2014-08-14 NOTE — Progress Notes (Signed)
PT/OT recommending ST-SNF at dc; CSW consulted to facilitate possible dc to SNF when medically stable.    Will follow progress.  Reinaldo Raddle, RN, BSN  Trauma/Neuro ICU Case Manager [phone]

## 2014-08-14 NOTE — Evaluation (Signed)
Occupational Therapy Evaluation Patient Details Name: Jeanne Lawson MRN: [id number] DOB: Dec 02, 1945 Today's Date: 08/14/2014    History of Present Illness Patient is a 69 y.o. year old female who presents for evaluation of her carotid arteries. She is referred by Dr. [name] for consideration of open exposure of her common carotid artery in order to treat intracranial aneurysms with coiling/stenting.in the near future   Clinical Impression   Pt admitted with above. She demonstrates the below listed deficits and will benefit from continued OT to maximize safety and independence with BADLs.  Pt presents to OT with Lt inattention (veers to Lt when ambulating and ran into nursing desk on Lt x 1), ? 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  Overall, she requires min - mod A for ADLs.  Family is unable to provide 24 hour assist at discharge, as she is a high risk for falls.  Recommend SNF level rehab.       Follow Up Recommendations  SNF    Equipment Recommendations  None recommended by OT    Recommendations for Other Services       Precautions / Restrictions Precautions Precautions: Fall      Mobility Bed Mobility Overal bed mobility: Needs Assistance Bed Mobility: Supine to Sit     Supine to sit: Min guard        Transfers Overall transfer level: Needs assistance   Transfers: Sit to/from Stand;Stand Pivot Transfers Sit to Stand: Min guard Stand pivot transfers: Min guard       General transfer comment: min guard assist for balance     Balance Overall balance assessment: Needs assistance Sitting-balance support: Feet supported Sitting balance-Leahy Scale: Fair     Standing balance support: Single extremity supported;During functional activity Standing balance-Leahy Scale: Poor Standing balance comment: reliant on UE support                              ADL Overall ADL's : Needs assistance/impaired Eating/Feeding: Set up;Sitting   Grooming: Wash/dry hands;Wash/dry face;Min guard;Standing   Upper Body Bathing: Minimal assitance;Sitting   Lower Body Bathing: Moderate assistance;Sit to/from stand   Upper Body Dressing : Minimal assistance;Sitting   Lower Body Dressing: Moderate assistance;Sit to/from stand Lower Body Dressing Details (indicate cue type and reason): Pt unable to pull socks over toes without assistance  Toilet Transfer: Min guard;Ambulation;Comfort height toilet;RW   Toileting- Water quality scientist and Hygiene: Min guard;Sit to/from stand       Functional mobility during ADLs: Min guard;Rolling walker       Vision Vision Assessment?: Yes Eye Alignment: Within Functional Limits Ocular Range of Motion: Within Functional Limits Tracking/Visual Pursuits: Other (comment) Visual Fields: Other (comment) Additional Comments: Pt consistently lost fixation on Lt during occulomotor pursuits.  During bil. confrontation testing, pt missed items on Rt in the inferior and central Rt fields - was able to see the object at ~120   Perception Perception Perception Tested?: Yes Perception Deficits: Inattention/neglect Inattention/Neglect: Does not attend to left visual field Spatial deficits: Pt noted to veer to Lt while ambulating in hallway - ran into desk x 1   Praxis      Pertinent Vitals/Pain Pain Assessment: 0-10 Pain Score: 5  Pain Location: headache Pain Descriptors / Indicators: Headache Pain Intervention(s): Monitored during session     Hand Dominance Right   Extremity/Trunk Assessment Upper  Extremity Assessment Upper Extremity Assessment: LUE deficits/detail;Generalized weakness LUE Deficits / Details: Pt reports h/o previous CVA with mild residual weakness Lt. UE.  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  Shoulder elevation to ~ 90* with elbow flexed.  Able with mass grasp  and mass release.  Lt UE falls off walker at times while ambulating with decreaed awareness  LUE Coordination: decreased fine motor;decreased gross motor   Lower Extremity Assessment Lower Extremity Assessment: LLE deficits/detail LLE Deficits / Details: Pt noted to "drag" Lt LE during ambulation - decreased toe clearance        Communication Communication Communication: Expressive difficulties (dysarthric )   Cognition Arousal/Alertness: Awake/alert Behavior During Therapy: Flat affect Overall Cognitive Status: Impaired/Different from baseline Area of Impairment: Memory;Attention;Safety/judgement;Awareness;Problem solving   Current Attention Level: Selective     Safety/Judgement: Decreased awareness of safety;Decreased awareness of deficits Awareness: Intellectual Problem Solving: Slow processing;Decreased initiation;Difficulty sequencing;Requires verbal cues;Requires tactile cues General Comments: Pt required information to be repeated multiple times.  Slow processing noted.  min verbal cues for problem solving.  Pt with decreased awareness of need for 24 hour supervision/assist    General Comments       Exercises       Shoulder Instructions      Home Living Family/patient expects to be discharged to:: Skilled nursing facility Living Arrangements: Alone Available Help at Discharge: Family;Friend(s);Available PRN/intermittently Type of Home: House Home Access: Stairs to enter [employer] of Steps: 2 Entrance Stairs-Rails: Right;Left;Can reach both Home Layout: One level     Bathroom Shower/Tub: Tub/shower unit Shower/tub characteristics: Curtain Biochemist, clinical: Standard     Home Equipment: Cane - single point;Tub bench;Walker - 2 wheels   Additional Comments: Pt initially was claiming to have 24 hour assist available at discharge.  However, daughter present and reports that 24 hour assist is NOT available at discharge.       Prior  Functioning/Environment Level of Independence: Independent with assistive device(s)        Comments: uses single point cane and furniture surfs    OT Diagnosis: Generalized weakness;Cognitive deficits;Disturbance of vision;Hemiplegia non-dominant side   OT Problem List: Decreased strength;Decreased activity tolerance;Impaired balance (sitting and/or standing);Impaired vision/perception;Decreased coordination;Decreased cognition;Decreased safety awareness;Decreased knowledge of use of DME or AE;Impaired UE functional use   OT Treatment/Interventions: Self-care/ADL training;Neuromuscular education;DME and/or AE instruction;Therapeutic activities;Cognitive remediation/compensation;Visual/perceptual remediation/compensation;Patient/family education;Balance training    OT Goals(Current goals can be found in the care plan section) Acute Rehab OT Goals Patient Stated Goal: to go home  OT Goal Formulation: With patient/family Time For Goal Achievement: 08/28/14 Potential to Achieve Goals: Good ADL Goals Pt Will Perform Eating: with modified independence;sitting Pt Will Perform Grooming: with supervision;standing Pt Will Perform Upper Body Bathing: with set-up;sitting Pt Will Perform Lower Body Bathing: with supervision;sit to/from stand Pt Will Perform Upper Body Dressing: with set-up;sitting Pt Will Perform Lower Body Dressing: with supervision;sit to/from stand Pt Will Transfer to Toilet: with supervision;ambulating;regular height toilet;bedside commode;grab bars Pt Will Perform Toileting - Clothing Manipulation and hygiene: with supervision;sit to/from stand  OT Frequency: Min 2X/week   Barriers to D/C: Decreased caregiver support          Co-evaluation              End of Session Equipment Utilized During Treatment: Rolling walker Nurse Communication: Mobility status  Activity Tolerance: Patient tolerated treatment well Patient left: in chair;with call bell/phone within  reach;with family/visitor present   Time: 3220-2542 OT Time Calculation (min): 70 min  Charges:  OT General Charges $OT Visit: 1 Procedure OT Evaluation $Initial OT Evaluation Tier I: 1 Procedure OT Treatments $Self Care/Home Management : 53-67 mins G-Codes:    Lucille Passy M 08/15/14, 6:57 PM

## 2014-08-14 NOTE — Progress Notes (Signed)
PCCM Interval Note  Pt tolerated extubation 7/21, no evidence airway compromise.  Taking PO and planning to restart home meds today To floor soon, probably 7/23  PCCM available if we can assist  Baltazar Apo, MD, PhD 08/14/2014, 10:07 AM [facility] [phone] or if no answer [phone]

## 2014-08-14 NOTE — Progress Notes (Addendum)
Physical Therapy Treatment Patient Details Name: Jeanne Lawson MRN: [id number] DOB: [birth year] Today's Date: 08/14/2014    History of Present Illness Patient is a 69 y.o. year old female who presents for evaluation of her carotid arteries. She is referred by Dr. [name] for consideration of open exposure of her common carotid artery in order to treat intracranial aneurysms with coiling/stenting.in the near future    PT Comments    Pt more agreeable to participate in therapy today. Pt overall min guard with bed mobility, transfers, standing balance and ambulation. Pt progressed from ambulating with RW X 75 feet to single point cane X 75 feet without supplemental oxygen. Pt able to perform standing hygiene with RW present while reaching in multi-directions.   Follow Up Recommendations  SNF     Equipment Recommendations  None recommended by PT    Recommendations for Other Services       Precautions / Restrictions Precautions Precautions: Fall Restrictions Weight Bearing Restrictions: No    Mobility  Bed Mobility Overal bed mobility: Needs Assistance Bed Mobility: Supine to Sit     Supine to sit: Min guard     General bed mobility comments: pt prefers to get OOB on left side  Transfers Overall transfer level: Needs assistance Equipment used: Rolling walker (2 wheeled)   Sit to Stand: Min guard         General transfer comment: overall min guard for safety.  Ambulation/Gait Ambulation/Gait assistance: Min guard Ambulation Distance (Feet): 150 Feet Assistive device: Rolling walker (2 wheeled);Straight cane (started with RW and progressed to single point cane on R) Gait Pattern/deviations: Step-through pattern;Decreased step length - right;Decreased step length - left;Decreased stride length Gait velocity: slower   General Gait Details: shorter slower steps, stable hemiparetic gait, safe use of cane.   Stairs            Wheelchair Mobility     Modified Rankin (Stroke Patients Only)       Balance Overall balance assessment: Needs assistance Sitting-balance support: No upper extremity supported;Feet supported                                Cognition Arousal/Alertness: Awake/alert Behavior During Therapy: WFL for tasks assessed/performed Overall Cognitive Status: Within Functional Limits for tasks assessed                      Exercises      General Comments General comments (skin integrity, edema, etc.): pt able to sit independent in recliner to eat, pt only needs min guard for steading in standing balace while reaching.      Pertinent Vitals/Pain Pain Assessment: No/denies pain    Home Living                      Prior Function            PT Goals (current goals can now be found in the care plan section) Acute Rehab PT Goals Patient Stated Goal: back to work PT Goal Formulation: With patient Potential to Achieve Goals: Good Progress towards PT goals: Progressing toward goals    Frequency  Min 3X/week    PT Plan Current plan remains appropriate    Co-evaluation             End of Session Equipment Utilized During Treatment: Other (comment) (none) Activity Tolerance: Patient tolerated treatment well;Patient limited by fatigue Patient left:  in chair;with call bell/phone within reach     Time: 1412-1440 PT Time Calculation (min) (ACUTE ONLY): 28 min  Charges:  $Gait Training: 8-22 mins $Therapeutic Activity: 8-22 mins                    G Codes:      Nnamdi Dacus, Tessie Fass 08/14/2014, 5:55 PM 08/14/2014  Donnella Sham, PT [phone]  (pager)

## 2014-08-14 NOTE — Progress Notes (Signed)
[place] Progress Note Patient Name: MARISEL TOSTENSON DOB: 01/11/1946 MRN: [id number]   Date of Service  08/14/2014  HPI/Events of Note  Hypokalemia  eICU Interventions  Potassium replaced     Intervention Category Intermediate Interventions: Electrolyte abnormality - evaluation and management  Jayjay Littles 08/14/2014, 5:51 AM

## 2014-08-14 NOTE — Progress Notes (Signed)
[facility]  Subjective  - pt feels tired   Objective 138/88 74 99 F (37.2 C) (Oral) 20 99%  Intake/Output Summary (Last 24 hours) at 08/14/14 1011 Last data filed at 08/14/14 0900  Gross per 24 hour  Intake 2199.89 ml  Output   1130 ml  Net 1069.89 ml   Right neck incision healing no hematoma Some mild hoarseness post extubation Swallowing her own secretions and water without difficulty  Assessment/Planning: Hoarseness and some edema in right neck most likely combination of ET tube and some cranial nerve neuropraxia all should recover with time.  Pt reassured.  Will need follow up with [place] in 2 weeks to check incision  I think her swallow mechanism is reasonable to advance diet.  Ruta Hinds 08/14/2014 10:11 AM --  Laboratory Lab Results:  Recent Labs  08/13/14 0503 08/14/14 0237  WBC 6.6 4.8  HGB 10.2* 9.1*  HCT 30.3* 27.9*  PLT 110* 102*   BMET  Recent Labs  08/13/14 0503 08/14/14 0237  NA 141 144  K 3.8 3.4*  CL 113* 112*  CO2 24 25  GLUCOSE 127* 97  BUN 13 12  CREATININE 0.69 0.69  CALCIUM 7.2* 7.3*    COAG Lab Results  Component Value Date   INR 1.21 08/12/2014   INR 1.01 08/07/2014   INR 1.02 05/12/2014   No results found for: PTT

## 2014-08-14 NOTE — Progress Notes (Signed)
[facility] ADULT ICU REPLACEMENT PROTOCOL FOR AM LAB REPLACEMENT ONLY  The patient does not apply for the [facility] Adult ICU Electrolyte Replacment Protocol based on the criteria listed below:     Is urine output >/= 0.5 ml/kg/hr for the last 6 hours? No. Patient's UOP is 0.3 ml/kg/hr    Abnormal electrolyte(s): K3.4  If a panic level lab has been reported, has the CCM MD in charge been notified? Yes.  .   Physician:  E Deterding,MD  Vear Clock 08/14/2014 5:45 AM

## 2014-08-14 NOTE — Clinical Social Work Note (Signed)
Clinical Social Worker received referral for possible ST-SNF placement.  Chart reviewed.  PT/OT recommending no follow up at this time.    CSW signing off - please re consult if social work needs arise.  Barbette Or, [place]

## 2014-08-14 NOTE — Evaluation (Signed)
Clinical/Bedside Swallow Evaluation Patient Details  Name: Jeanne Lawson MRN: [id number] Date of Birth: 1945/04/29  Today's Date: 08/14/2014 Time: SLP Start Time (ACUTE ONLY): 1140 SLP Stop Time (ACUTE ONLY): 1157 SLP Time Calculation (min) (ACUTE ONLY): 17 min  Past Medical History:  Past Medical History  Diagnosis Date  . Ventricular hypertrophy 04/2009    LVH with diastolic dysfunction by echo. Has normal EF.  Marland Kitchen Pancreatitis     x2  . PAC (premature atrial contraction)   . TIA (transient ischemic attack)     She was hospitalized 04-23-09 through 04-27-09 for involving right side of the body  . Hypertension   . Tobacco abuse     Ongoing   . Edema of foot     She has a history of chronic edema of the left dated back to age 74 when she suufered severe frostbite playing  on the snow as a child  . Coronary artery disease 1996    Known with prior mild lesion of LAD demonstrated by Cardiac Catheterization in 1996  . COPD (chronic obstructive pulmonary disease)   . PONV (postoperative nausea and vomiting)     problems waking up last time 4/16 only time  . Shortness of breath dyspnea     since stroke 2 months ago -  . 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  . 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 Neck surgery  50 yrs ago    Left side tumor  . US echocardiography  04-26-2009    EF 65-70%  . Cardiovascular stress test  12-02-2001    EF 70%  . Cardiac catheterization  1996    Mild CAD with vasospasm  . Abdominal hysterectomy    . Laparoscopy    . Radiology with anesthesia N/A 05/25/2014    Procedure: RADIOLOGY WITH ANESTHESIA;  Surgeon: [name], MD;  Location: [facility];  Service: Radiology;  Laterality:  N/A;  . Appendectomy    . Radiology with anesthesia N/A 08/12/2014    Procedure: RADIOLOGY WITH ANESTHESIA;  Surgeon: [name], MD;  Location: [facility];  Service: Radiology;  Laterality: N/A;  . Endarterectomy Right 08/12/2014    Procedure: RIGHT  COMMON CAROTID ARTERY EXPOSURE FOR INTERVENTIONAL RADIOLOGY PROCEDURE BY DR.DEVASHWAR,Insertion 6 FR sheath;  Surgeon: Elam Dutch, MD;  Location[facility] OR;  Service: Vascular;  Laterality: Right;  . Endarterectomy Right 08/12/2014    Procedure:  CAROTID  EXPOSURE CLOSURE RIGHT NECK, REPAIR RIGHT COMMON CAROTID ARTERY;  Surgeon: Elam Dutch, MD;  Location: [facility] OR;  Service: Vascular;  Laterality: Right;   HPI:  Patient is a 69 y.o. year old female who presents for evaluation of her carotid arteries. She is referred by Dr. [name] for consideration of open exposure of her common carotid artery in order to treat intracranial aneurysms with coiling/stenting.in the near future   Assessment / Plan / Recommendation Clinical Impression  Pt is very cautiously accepting POs at this time, due to fear of "choking" after surgery. Vocal quality is rough with mildly reduced intensity. She consumed very small amounts of thin liquids, purees, and even softened solid foods without overt evidence of aspiration.  However, at this time pt is requesting liquid diet for her own comfort level. Would continue with full liquid diet without straws. SLP to follow for safe diet advancement.    Aspiration Risk  Moderate    Diet Recommendation Thin (full liquids)   Medication Administration: Crushed with puree Compensations: Slow rate;Small sips/bites    Other  Recommendations Oral Care Recommendations: Oral care BID   Follow Up Recommendations       Frequency and Duration min 2x/week  2 weeks   Pertinent Vitals/Pain n/a    SLP Swallow Goals     Swallow Study Prior Functional Status       General Other Pertinent Information: Patient is a 69 y.o. year  old female who presents for evaluation of her carotid arteries. She is referred by Dr. [name] for consideration of open exposure of her common carotid artery in order to treat intracranial aneurysms with coiling/stenting.in the near future Type of Study: Bedside swallow evaluation Previous Swallow Assessment: none in chart Diet Prior to this Study: Thin liquids (full liquids) Temperature Spikes Noted: Yes (99.8) Respiratory Status: Supplemental O2 delivered via (comment) ([place]) History of Recent Intubation: Yes Length of Intubations (days): 2 days Date extubated: 08/13/14 Behavior/Cognition: Alert;Cooperative;Pleasant mood Oral Cavity - Dentition: Adequate natural dentition/normal for age Self-Feeding Abilities: Able to feed self Patient Positioning: Upright in bed Baseline Vocal Quality: Other (comment) (rough)    Oral/Motor/Sensory Function Overall Oral Motor/Sensory Function: Appears within functional limits for tasks assessed   Ice Chips Ice chips: Not tested   Thin Liquid Thin Liquid: Within functional limits Presentation: Cup;Self Fed;Spoon    Nectar Thick Nectar Thick Liquid: Not tested   Honey Thick Honey Thick Liquid: Not tested   Puree Puree: Within functional limits Presentation: Self Fed;Spoon   Solid    Solid: Within functional limits Presentation: Self Fed      Germain Osgood, M.A. CCC-SLP [phone]  Germain Osgood 08/14/2014,1:54 PM

## 2014-08-15 ENCOUNTER — Other Ambulatory Visit: Payer: Self-pay | Admitting: Radiology

## 2014-08-15 DIAGNOSIS — I729 Aneurysm of unspecified site: Secondary | ICD-10-CM

## 2014-08-15 LAB — PLATELET INHIBITION P2Y12: Platelet Function  P2Y12: 91 [PRU] — ABNORMAL LOW (ref 194–418)

## 2014-08-15 MED ORDER — ASPIRIN 325 MG PO TABS: 325.0000 mg | ORAL_TABLET | Freq: Every day | ORAL | Status: DC

## 2014-08-15 NOTE — Discharge Summary (Signed)
Patient ID: SHANTESE RAVEN MRN: [id number] DOB/AGE: 1945/03/03 69 y.o.  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    Discharged Condition: Good, stable.   Hospital Course: Jeanne Lawson is a 69 y.o. female with previous CVA 2011 and development of dizziness and "leaning to left" symptoms. Her work up revealed cerebral aneurysms and she was then referred to Dr [name] for consult then treatment. On 05/29/14 attempt was made for embolization of Right posterior communicating artery aneurysm with pipeline stent placement, however due to the anatomy of the aortic arch with secondary distortion of the origins of these vessels related to the unfolding of the aortic arch, distal access into the right internal carotid artery could not be established. The procedure was therefore stopped.   She presented back on 08/12/14 for an elective Right PCOM embolization via right carotid access that was established by Vascular surgery. She underwent the procedure with successful embolization with pipeline flow diverter and was admitted for close neurological observation. She was noted to have continued oozing at right carotid access site and ultrasound was performed that revealed resolving neck hematoma and no evidence of carotid pseudoaneurysm. The patient was extubated successfully on 08/13/14 and has since tolerating oral medication and diet without difficulty and oxygenating well on RA. She is ambulating well and has been evaluated by PT/OT who recommends SNF placement since the patient lives by herself. She does admit to headache last night that was relieved with Tylenol. She denies any headache today. She denies any visual changes, denies any new extremity weakness or sensation loss. She  denies any nausea or vomiting. She denies any chest pains or shortness of breath. Her left sided arm and leg weakness are not any worse than prior to procedure. Her P2Y12 today is 73, this has been discussed today with Dr. [name] and she will continue ASA 325mg  daily and Plavix 75mg  daily. Her speech hoarseness is clearing and her foley catheter has been removed and she has urinated well without difficulty. Dr. [name] has seen the patient today and deemed her medically stable for discharge. She will follow-up with Dr. [name] in 2 weeks.    Consults: Anesthesia.   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   Discharge Exam: Blood pressure 149/69, pulse 63, temperature 99.1 F (37.3 C), temperature source Oral, resp. rate 23, height 5\' 1"  (1.549 m), weight 161 lb 6 oz (73.2 kg), SpO2 95 %.  PE: General: A&Ox3, NAD, sitting up in bed drinking coffee.  Heart: RRR without M/G/R Lungs: CTA b/l Neck: Right sided ecchymosis, soft with a smallnodule palpated at site of puncture. no signs of active hematoma or bleeding.  Abd: Soft, NT, ND, (+) BS Ext: LLE edema, Left hand edema, sensation intact.  Neuro: Speech clear, minimal word finding difficulty, No cranial nerve abns noted. Motor. LUE 4/5 with pronation drift. LLE 5/5 Fine motor less fluent on F to N .  Disposition: SNF when available per OT recommendations.       Discharge Instructions    Call MD for:  difficulty breathing, headache or visual disturbances    Complete by:  As directed      Call MD for:  extreme fatigue    Complete by:  As directed      Call MD  for:  hives    Complete by:  As directed      Call MD for:  persistant dizziness or light-headedness    Complete by:  As directed      Call MD for:  persistant nausea and vomiting    Complete by:  As directed      Call MD for:  redness, tenderness, or signs of infection (pain, swelling, redness, odor or green/yellow discharge around incision  site)    Complete by:  As directed      Call MD for:  severe uncontrolled pain    Complete by:  As directed      Call MD for:  temperature >100.4    Complete by:  As directed      Diet - low sodium heart healthy    Complete by:  As directed      Driving Restrictions    Complete by:  As directed   No driving x 2 weeks     Increase activity slowly    Complete by:  As directed      Lifting restrictions    Complete by:  As directed   No lifting over 20 lbs x 2 weeks     Other Restrictions    Complete by:  As directed   No bending or stooping over x 2 weeks            Medication List    STOP taking these medications        aspirin EC 81 MG tablet  Replaced by:  aspirin 325 MG tablet      TAKE these medications        albuterol 108 (90 BASE) MCG/ACT inhaler  Commonly known as:  PROVENTIL HFA;VENTOLIN HFA  Inhale 1-2 puffs into the lungs every 6 (six) hours as needed for wheezing or shortness of breath.     aspirin 325 MG tablet  Take 1 tablet (325 mg total) by mouth daily with breakfast.     atorvastatin 40 MG tablet  Commonly known as:  LIPITOR  Take 1 tablet (40 mg total) by mouth daily at 6 PM.     clopidogrel 75 MG tablet  Commonly known as:  PLAVIX  TAKE 1 TABLET BY MOUTH DAILY.     lisinopril-hydrochlorothiazide 20-12.5 MG per tablet  Commonly known as:  PRINZIDE,ZESTORETIC  Take 1 tablet by mouth daily.     mometasone-formoterol 100-5 MCG/ACT Aero  Commonly known as:  DULERA  Inhale 2 puffs into the lungs 2 (two) times daily.     TOPROL XL 50 MG 24 hr tablet  Generic drug:  metoprolol succinate  TAKE 1/2 TABLET BY MOUTH TWICE DAILY.       Follow-up Information    Follow up with Ruta Hinds, MD In 2 weeks.   Specialties:  Vascular Surgery, Cardiology   Why:  Office will call you to arrange your appt (sent)   Contact information:   [place] [address] [phone]       Follow up with DEVESHWAR, Fritz Pickerel, MD In 2 weeks.    Specialty:  Interventional Radiology   Why:  Our office will call patient with appointment [phone]   Contact information:   [address] [place] [address] [phone]        Signed: Hedy Jacob 08/15/2014, 11:17 AM   I have spent Greater Than 30 Minutes discharging [name].

## 2014-08-15 NOTE — Progress Notes (Signed)
Speech Language Pathology Treatment: Dysphagia  Patient Details Name: Jeanne Lawson MRN: [id number] DOB: Aug 29, 1945 Today's Date: 08/15/2014 Time: 8887-5797 SLP Time Calculation (min) (ACUTE ONLY): 7 min  Assessment / Plan / Recommendation Clinical Impression  Pt with good awareness to her dysphagia - diet advanced to regular by MD.  SLP observed pt consuming coffee- no indications of aspiration or dysphagia.  Did not observe pt with solids as session was interrupted - MD called to speak to family on phone and pt needed to be participative.  Pt's voice remains mildly hoarse but markedly improved per pt/family.  Self-scored swallow ability yesterday was 4/10, today 6/10.  Advised pt to mitigation strategies to ease swallow ability.   Pt reports coughing at baseline - expectoration of blood tinged secretions reported.  Family endorses pt coughs without intake.    SLP will sign off as pt keenly aware of swallowing deficits and appears to be compensating well.  Please reorder if indicated.    HPI Other Pertinent Information: Patient is a 69 y.o. year old female who presents for evaluation of her carotid arteries. She is referred by Dr. [name] for consideration of open exposure of her common carotid artery in order to treat intracranial aneurysms with coiling/stenting.in the near future   Pertinent Vitals Pain Assessment: No/denies pain  SLP Plan  All goals met    Recommendations Diet recommendations: Regular;Thin liquid Medication Administration: Crushed with puree (if not contraindicated) Supervision: Patient able to self feed Compensations: Slow rate;Small sips/bites Postural Changes and/or Swallow Maneuvers: Seated upright 90 degrees;Upright 30-60 min after meal              Oral Care Recommendations: Oral care BID Follow up Recommendations: None Plan: All goals met    [place], [place] [facility] SLP [phone]

## 2014-08-15 NOTE — Progress Notes (Addendum)
Patient ID: Jeanne Lawson, female   DOB: 1945/07/31, 69 y.o.   MRN: [id number] S/P  RT  PCOM aneurysm embolization with flow diverter. Direct RT carotid access day 3 post procedure. Up and around with  Gilford Rile and cane. Tolerating sot consistency diet.. Denies any visual, symptoms, or H/As, or N/V. Denies any chest paiuns,or SOB Lt sided weakness,arm , and leg apparently not any worse than prior to procedure.. P2 Y12 90s today. On exam.. Speech hoarseness clearing BPs 130s/70s HR  60s SR PaO2 99% RA appears depressed. Concerned regarding her discharge. Marland Kitchen Responses appropriate. AAO x 3 speech minimal word finding difficulty. No cranial nerve abns noted. Motor. LUE 4/5 with pronation drift. LLE 5-/5 Fine motor less fluent on F to N . Neck ecchymotic with a small  Nodule palpated at site of puncture.  Plan. 1.Continue aspirin and plavix, and home meds. 2.Increase ambulation and advance  diet as tolerated. 3.Awaiting social worker regarding SNF .  4.See in clinic in 2 weeks for follow up D/W patient .

## 2014-08-15 NOTE — Clinical Social Work Note (Signed)
Clinical Social Work Assessment  Patient Details  Name: Jeanne Lawson MRN: [id number] Date of Birth: [birth year]  Date of referral:  08/15/14               Reason for consult:  Facility Placement                Permission sought to share information with:  Family Supports, Chartered certified accountant granted to share information::  Yes, Verbal Permission Granted  Name::        Agency::     Relationship::     Contact Information:     Housing/Transportation Living arrangements for the past 2 months:  Single Family Home Source of Information:  Patient, Adult Children Patient Interpreter Needed:  None Criminal Activity/Legal Involvement Pertinent to Current Situation/Hospitalization:  No - Comment as needed Significant Relationships:  Adult Children Lives with:  Self Do you feel safe going back to the place where you live?  Yes Need for family participation in patient care:     Care giving concerns: Patient would like to go home but because of lack of ability to ambulate independently she is willing to go to a short term rehab facility. Patient was with 2 daughters and 1 granddaughter when CSW arrived. All pleasant and supportive of ST-SNF. CSW shared list of placements and family stated that their first preference is [facility] due to it's proximity to one of the daughter's home. Patient and family aware that alternative placements may be considered if patient not offered a bed at the facility of their choosing.    Social Worker assessment / plan: CSW initiated assessment process and will fax patient out to [employer].   Employment status:  Retired Nurse, adult PT Recommendations:  [place] / Referral to community resources:  [place]  Patient/Family's Response to care: Patient and family pleasant and agreeable.   Patient/Family's Understanding of and Emotional Response to Diagnosis, Current  Treatment, and Prognosis: Though family has been concerned about patient's reluctance to participate in [place], family and patient identify it as a ST necessity in order to support patient returning to independent living baseline.   Emotional Assessment Appearance:  Appears stated age Attitude/Demeanor/Rapport:   (Appropriate, pleasant) Affect (typically observed):  Accepting, Appropriate Orientation:  Oriented to Self, Oriented to Place, Oriented to  Time, Oriented to Situation Alcohol / Substance use:  Not Applicable Psych involvement (Current and /or in the community):  No (Comment)  Discharge Needs  Concerns to be addressed:  No discharge needs identified Readmission within the last 30 days:  No Current discharge risk:  None Barriers to Discharge:  No Barriers Identified   Alesia Oshields, Daneil Dolin, LCSW 08/15/2014, 2:10 PM

## 2014-08-16 LAB — TYPE AND SCREEN
ABO/RH(D): A POS
ANTIBODY SCREEN: NEGATIVE
UNIT DIVISION: 0
Unit division: 0
Unit division: 0

## 2014-08-16 MED ORDER — PANTOPRAZOLE SODIUM 40 MG PO TBEC
40.0000 mg | DELAYED_RELEASE_TABLET | Freq: Every day | ORAL | Status: DC
  Administered 2014-08-16 – 2014-08-17 (×2): 40 mg via ORAL
  Filled 2014-08-16 (×2): qty 1

## 2014-08-16 NOTE — Progress Notes (Signed)
PHARMACIST - PHYSICIAN COMMUNICATION DR:   [name] CONCERNING: Protonix IV to Oral Route Change Policy  RECOMMENDATION: This patient is receiving Protonix by the intravenous route.  Based on criteria approved by the Pharmacy and Therapeutics Committee, this drug is being converted to the equivalent oral dose form(s).  DESCRIPTION: These criteria include:  The patient is eating (either orally or via tube) and/or has been taking other orally administered medications for a least 24 hours  There is no active GI bleed or impaired GI absorption noted.   If you have questions about this conversion, please contact the Pharmacy Department  []   [phone] )  Jeanne Lawson [x]   [phone] )  Jeanne Lawson  []   [phone] )  [facility] []   [phone] )  [place], PharmD, BCPS Clinical Pharmacist Pager: [phone] 08/16/2014 11:40 AM

## 2014-08-16 NOTE — Progress Notes (Signed)
Subjective: Patient is doing well without complaints. She is waiting for bed placement at SNF.  Allergies: Dilaudid; Codeine; Hydromorphone hcl; Latex; and Sulfa drugs cross reactors  Medications: Prior to Admission medications   Medication Sig Start Date End Date Taking? Authorizing Provider  albuterol (PROVENTIL HFA;VENTOLIN HFA) 108 (90 BASE) MCG/ACT inhaler Inhale 1-2 puffs into the lungs every 6 (six) hours as needed for wheezing or shortness of breath.   Yes Historical Provider, MD  aspirin EC 81 MG tablet Take 81 mg by mouth daily.   Yes Historical Provider, MD  atorvastatin (LIPITOR) 40 MG tablet Take 1 tablet (40 mg total) by mouth daily at 6 PM. 05/27/14  Yes Darlin Coco, MD  clopidogrel (PLAVIX) 75 MG tablet TAKE 1 TABLET BY MOUTH DAILY. 05/27/14  Yes Darlin Coco, MD  lisinopril-hydrochlorothiazide (PRINZIDE,ZESTORETIC) 20-12.5 MG per tablet Take 1 tablet by mouth daily. 05/27/14  Yes Darlin Coco, MD  TOPROL XL 50 MG 24 hr tablet TAKE 1/2 TABLET BY MOUTH TWICE DAILY. 07/01/14  Yes Darlin Coco, MD  aspirin 325 MG tablet Take 1 tablet (325 mg total) by mouth daily with breakfast. 08/15/14   Hedy Jacob, PA-C  mometasone-formoterol (DULERA) 100-5 MCG/ACT AERO Inhale 2 puffs into the lungs 2 (two) times daily. Patient not taking: Reported on 05/15/2014 05/04/14   Janece Canterbury, MD    Vital Signs: BP 151/83 mmHg  Pulse 63  Temp(Src) 97 F (36.1 C) (Axillary)  Resp 21  Ht 5\' 1"  (1.549 m)  Wt 161 lb 6 oz (73.2 kg)  BMI 30.51 kg/m2  SpO2 98%  Physical Exam General: A&Ox3, NAD, sitting up in chair Heart: RRR without M/G/R Lungs: CTA b/l Neck: Right sided ecchymosis, soft with a smallnodule palpated at site of puncture, no signs of active hematoma or bleeding.  Abd: Soft, NT, ND, (+) BS Ext: LLE edema, Left hand edema, sensation intact.  Neuro: Speech clear, minimal word finding difficulty, No cranial nerve abns noted. Motor. LUE 4/5 with pronation  drift. LLE 5/5 Fine motor less fluent on F to N  Imaging: US Soft Tissue Head/neck  08/14/2014   CLINICAL DATA:  Assessment of right neck hematoma after direct right common carotid arterial exposure for intracerebral aneurysm repair.  EXAM: ULTRASOUND OF HEAD/NECK SOFT TISSUES  TECHNIQUE: Ultrasound examination of the head and neck soft tissues was performed in the area of clinical concern.  COMPARISON:  08/13/2014  FINDINGS: By ultrasound, the superficial right neck hematoma shows decrease in size, now measuring approximately 3.3 x 1.2 x 2.8 cm (previously 4.4 x 1.7 x 3.8 cm). The hematoma demonstrates no internal flow. No carotid pseudoaneurysm is identified.  IMPRESSION: Resolving right neck hematoma which has decreased in size. There remains no evidence of carotid arterial pseudoaneurysm.   Electronically Signed   By: Aletta Edouard M.D.   On: 08/14/2014 17:07   US Soft Tissue Head/neck  08/13/2014   CLINICAL DATA:  Status post exposure and repair of right common carotid artery for access prior to intracerebral aneurysm repair. Right neck hematoma has developed at the surgical exposure site.  EXAM: ULTRASOUND OF HEAD/NECK SOFT TISSUES  TECHNIQUE: Ultrasound examination of the head and neck soft tissues was performed in the area of clinical concern.  COMPARISON:  None.  FINDINGS: Focal relatively hypoechoic oval hematoma is noted in the subcutaneous tissues superficial to common carotid arterial exposure. This hematoma measures approximately 4.4 x 1.7 x 3.8 cm. No active blood flow is identified by Doppler within the hematoma. There is  no evidence by ultrasound of arterial pseudoaneurysm or AV fistula.  IMPRESSION: Focal hematoma of the right neck superficial to the common carotid artery and measuring approximately 4 cm in greatest diameter. There is no evidence of carotid pseudoaneurysm.   Electronically Signed   By: Aletta Edouard M.D.   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 Continuous headaches. Previous history of ischemic strokes.  EXAM: IR ANGIO INTRA EXTRACRAN SEL INTERNAL CAROTID UNI RIGHT MOD SED RIGHT COMMON CAROTID ARTERIOGRAM FOLLOWED BY ENDOVASCULAR EMBOLIZATION OF WIDE NECKED RIGHT INTERNAL CAROTID ARTERY POSTERIOR COMMUNICATING ARTERY REGION ANEURYSM USING A PIPELINE FLOW DIVERTER DEVICE:  ANESTHESIA/SEDATION: General anesthesia.  MEDICATIONS: As per general anesthesia  CONTRAST:  60 mL OMNIPAQUE IOHEXOL 300 MG/ML SOLN  PROCEDURE: Following a full explanation of the procedure along with the potential associated complications, an informed witnessed consent was obtained.  The procedure, the risks, the benefits and the alternatives were all reviewed in detail with the patient and the patient's family. Risks of thromboembolic stroke, procedural rupture of the aneurysm with potential for worsening neurological deficit, ventilator dependency, death and need for emergent surgery were all reviewed in detail. Informed consent was obtained.  The patient was then put under general anesthesia by the Department of Anesthesiology at [facility].  The patient was then transported to the general surgical suite where access into the right common carotid artery via cut down was then performed by Dr. Oneida Alar. A 4 cm 6 French sheath was then inserted into the right common carotid artery and stitched in position, and connected to continuous heparinized saline infusion.  The patient was then brought to the interventional neurovascular suite where the right neck area was then prepped with Betadine.  Using biplane roadmap technique and constant fluoroscopic guidance, over a 0.035 inch Roadrunner guidewire, a 5 French 105 cm Navien guide catheter was then advanced to the distal cervical right ICA without difficulty. The guidewire was removed. Good aspiration was obtained from the hub  off the hub of the 5 [place] Navien guide catheter. A control arteriogram performed through the guide catheter demonstrated no change in the intracranial circulation.  At this time in a coaxial manner and with constant heparinized saline infusion using biplane roadmap technique and constant fluoroscopic guidance, an 027 VIA microcatheter was then advanced through the [place] guide catheter over a 0.014 inch Softip Synchro micro guidewire to the distal end of the guide catheter. With the micro guidewire leading with the J-Tip configuration to avoid dissections or inducing spasm, the combination was then navigated to the cavernous segment of the right internal carotid artery. The micro guidewire was then gently manipulated with a torque device to gain access into the right middle cerebral artery M2 region inferior division followed by the microcatheter. The guidewire was removed. Good aspiration was obtained from the hub of the microcatheter. A gentle contrast injection demonstrated safe position of the tip of the microcatheter. The microcatheter was then connected to continuous heparinized saline infusion. At this time, measurements were performed of the vessel distal and proximal to the aneurysm. It was decided to place a 4.75 mm by 20 mm pipeline flex device.  In a coaxial manner and with constant heparinized saline infusion using biplane roadmap technique and constant fluoroscopic guidance, a 4.75 x 20 mm flex pipeline flow diversion device was then advanced to the distal end of the microcatheter without  difficulty.  The entire system was then straightened.  The distal 1 cm of the flow diverter was then unsheathed. This was then massaged by capturing the device into the microcatheter and reunsheathing it.  This caused the device to expand in the right mid M1 region.  The entire system was then gently retrieved more proximally until the distal landing zone which was proximal to the origin of the anterior  temporal branch without any evidence of perforators was visualized. Once there, unsheathing was then performed whilst advancing the micro guidewire delivered more over the stent. This was then interspersed with motions of gently advancing the microcatheter while holding the micro guidewire cause sloughing and close apposition with the parent vessel.  This kink was then continued until the device was delivered into the proximal cavernous segment of the right internal carotid artery with excellent apposition with the parent vessel. Throughout the procedure, the tip of the microcatheter was centralized to the lumen of the parent vessel in both the AP and lateral projections.  Once the entire device had been delivered, a control arteriogram performed through the Navien guide catheter demonstrated excellent apposition of the entire device which extended from just distal to the origin of the right middle cerebral artery to the proximal cavernous segment of the right internal carotid artery. There was always stagnation of contrast seen within the large aneurysm in the posterior communicating artery region. Continued flow was noted in the right posterior communicating artery. Also noted was stasis in the small aneurysm arising from the proximal ophthalmic artery, and also the superior hypophyseal region of the right internal carotid artery.  Under constant fluoroscopic guidance, with the wire being held in position in the distal right M1 segment, the microcatheter was then gently advanced through the construct causing further massaging and opposing the device onto the parent vessel wall.  The microcatheter was advanced into the distal right M1 segment and the wire captured. The wire and the delivery microcatheter were then gently retrieved and removed under constant fluoroscopic guidance ensuring no movement of the pipeline flow diverter device.  None was observed  Control arteriograms were then performed at 15, 30 and 40  minutes post placement of the flow diverter. These continued to demonstrate excellent flow through the device without evidence of intraluminal filling defects or spasms distally or proximally.  The 5 [place] Navien guide catheter, was then gently retrieved and removed. A control arteriogram performed prior to this in the right common carotid artery continued to demonstrate excellent flow through the right internal carotid artery circulation and also intracranially. No stasis or intraluminal filling defects or occlusions were seen intracranially.  Following the procedure the patient's neurologic status remained stable. Hemodynamically also the patient was unchanged.  Patient's ACT was in the range of 200 seconds. Prior to the patient being transferred back to the OR for removal of the 6 French sheath, inspection revealed no unusual bleeding or hematoma at the site of the entry in the right common carotid artery.  COMPLICATIONS: None.  FINDINGS: As above.  IMPRESSION: Status post endovascular embolization of a large wide necked right internal carotid artery posterior communicating artery region via a right neck carotid approach using a pipeline flow diverter device as described above without event. 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   Electronically Signed   By: [name] M.D.   On: 08/13/2014 16:59   Dg Chest Port 1 View  08/13/2014   CLINICAL  DATA:  Intubated patient, status post intracranial aneurysm repair  EXAM: PORTABLE CHEST - 1 VIEW  COMPARISON:  Portable chest x-ray of August 12, 2014  FINDINGS: The lungs are well-expanded. Minimal subsegmental atelectasis is noted at the lung bases. The cardiac silhouette remains enlarged. The central pulmonary vascularity remains prominent. The endotracheal tube tip projects 5.4 cm above the carina.  IMPRESSION: Slight interval increase in basilar airspace opacities greatest on the left  consistent with atelectasis or pneumonia. Stable cardiomegaly and central pulmonary vascular congestion.   Electronically Signed   By: David  Martinique M.D.   On: 08/13/2014 07:35   Dg Chest Port 1 View  08/12/2014   CLINICAL DATA:  69 year old female with a history of narrow intervention and direct carotid access. Status post intracranial aneurysm repair  EXAM: PORTABLE CHEST - 1 VIEW  COMPARISON:  Plain film 04/22/2014  FINDINGS: There has been interval placement of endotracheal tube. The tube terminates just above the carina, perhaps 1.2 cm.  Lung volumes adequate. Bilateral streaky opacities with no confluent airspace disease. No pneumothorax. No pleural effusion.  Atherosclerotic calcification of the aortic arch.  No pulmonary vascular congestion.  Cardiomediastinal silhouette is unchanged.  No displaced fracture.  IMPRESSION: Interval placement of endotracheal tube, which terminates just above the carina. The tube may be withdrawn for better position.  Streaky opacities of the bilateral lungs, likely atelectasis.  These results will be called to the ordering clinician or representative by the Radiologist Assistant, and communication documented in the PACS or zVision Dashboard.  Signed,  Dulcy Fanny. Earleen Newport, DO  [facility]  [facility]   Electronically Signed   By: Corrie Mckusick D.O.   On: 08/12/2014 15:55   Dg Abd Portable 1v  08/13/2014   CLINICAL DATA:  Orogastric tube placement.  Initial encounter.  EXAM: PORTABLE ABDOMEN - 1 VIEW  COMPARISON:  Chest radiograph same date.  FINDINGS: 0901 hr. Enteric tube projects into the right mid abdomen, likely within the distal stomach or proximal duodenum. The visualized bowel gas pattern is normal. There is mild breathing artifact. Scattered vascular calcifications are noted.  IMPRESSION: Enteric tube projects into the mid mid right abdomen, likely in the distal stomach or proximal duodenum.   Electronically Signed   By:  Richardean Sale M.D.   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 Continuous headaches. Previous history of ischemic strokes.  EXAM: IR ANGIO INTRA EXTRACRAN SEL INTERNAL CAROTID UNI RIGHT MOD SED RIGHT COMMON CAROTID ARTERIOGRAM FOLLOWED BY ENDOVASCULAR EMBOLIZATION OF WIDE NECKED RIGHT INTERNAL CAROTID ARTERY POSTERIOR COMMUNICATING ARTERY REGION ANEURYSM USING A PIPELINE FLOW DIVERTER DEVICE:  ANESTHESIA/SEDATION: General anesthesia.  MEDICATIONS: As per general anesthesia  CONTRAST:  60 mL OMNIPAQUE IOHEXOL 300 MG/ML SOLN  PROCEDURE: Following a full explanation of the procedure along with the potential associated complications, an informed witnessed consent was obtained.  The procedure, the risks, the benefits and the alternatives were all reviewed in detail with the patient and the patient's family. Risks of thromboembolic stroke, procedural rupture of the aneurysm with potential for worsening neurological deficit, ventilator dependency, death and need for emergent surgery were all reviewed in detail. Informed consent was obtained.  The patient was then put under general anesthesia by the Department of Anesthesiology at [facility].  The patient was then transported to the general surgical suite where access  into the right common carotid artery via cut down was then performed by Dr. Oneida Alar. A 4 cm 6 French sheath was then inserted into the right common carotid artery and stitched in position, and connected to continuous heparinized saline infusion.  The patient was then brought to the interventional neurovascular suite where the right neck area was then prepped with Betadine.  Using biplane roadmap technique and constant fluoroscopic guidance, over a 0.035 inch Roadrunner guidewire, a 5 French 105 cm Navien guide catheter was then advanced to the distal  cervical right ICA without difficulty. The guidewire was removed. Good aspiration was obtained from the hub off the hub of the 5 [place] Navien guide catheter. A control arteriogram performed through the guide catheter demonstrated no change in the intracranial circulation.  At this time in a coaxial manner and with constant heparinized saline infusion using biplane roadmap technique and constant fluoroscopic guidance, an 027 VIA microcatheter was then advanced through the [place] guide catheter over a 0.014 inch Softip Synchro micro guidewire to the distal end of the guide catheter. With the micro guidewire leading with the J-Tip configuration to avoid dissections or inducing spasm, the combination was then navigated to the cavernous segment of the right internal carotid artery. The micro guidewire was then gently manipulated with a torque device to gain access into the right middle cerebral artery M2 region inferior division followed by the microcatheter. The guidewire was removed. Good aspiration was obtained from the hub of the microcatheter. A gentle contrast injection demonstrated safe position of the tip of the microcatheter. The microcatheter was then connected to continuous heparinized saline infusion. At this time, measurements were performed of the vessel distal and proximal to the aneurysm. It was decided to place a 4.75 mm by 20 mm pipeline flex device.  In a coaxial manner and with constant heparinized saline infusion using biplane roadmap technique and constant fluoroscopic guidance, a 4.75 x 20 mm flex pipeline flow diversion device was then advanced to the distal end of the microcatheter without difficulty.  The entire system was then straightened.  The distal 1 cm of the flow diverter was then unsheathed. This was then massaged by capturing the device into the microcatheter and reunsheathing it.  This caused the device to expand in the right mid M1 region.  The entire system was then gently  retrieved more proximally until the distal landing zone which was proximal to the origin of the anterior temporal branch without any evidence of perforators was visualized. Once there, unsheathing was then performed whilst advancing the micro guidewire delivered more over the stent. This was then interspersed with motions of gently advancing the microcatheter while holding the micro guidewire cause sloughing and close apposition with the parent vessel.  This kink was then continued until the device was delivered into the proximal cavernous segment of the right internal carotid artery with excellent apposition with the parent vessel. Throughout the procedure, the tip of the microcatheter was centralized to the lumen of the parent vessel in both the AP and lateral projections.  Once the entire device had been delivered, a control arteriogram performed through the Navien guide catheter demonstrated excellent apposition of the entire device which extended from just distal to the origin of the right middle cerebral artery to the proximal cavernous segment of the right internal carotid artery. There was always stagnation of contrast seen within the large aneurysm in the posterior communicating artery region. Continued flow was noted in the right posterior communicating artery.  Also noted was stasis in the small aneurysm arising from the proximal ophthalmic artery, and also the superior hypophyseal region of the right internal carotid artery.  Under constant fluoroscopic guidance, with the wire being held in position in the distal right M1 segment, the microcatheter was then gently advanced through the construct causing further massaging and opposing the device onto the parent vessel wall.  The microcatheter was advanced into the distal right M1 segment and the wire captured. The wire and the delivery microcatheter were then gently retrieved and removed under constant fluoroscopic guidance ensuring no movement of the  pipeline flow diverter device.  None was observed  Control arteriograms were then performed at 15, 30 and 40 minutes post placement of the flow diverter. These continued to demonstrate excellent flow through the device without evidence of intraluminal filling defects or spasms distally or proximally.  The 5 [place] Navien guide catheter, was then gently retrieved and removed. A control arteriogram performed prior to this in the right common carotid artery continued to demonstrate excellent flow through the right internal carotid artery circulation and also intracranially. No stasis or intraluminal filling defects or occlusions were seen intracranially.  Following the procedure the patient's neurologic status remained stable. Hemodynamically also the patient was unchanged.  Patient's ACT was in the range of 200 seconds. Prior to the patient being transferred back to the OR for removal of the 6 French sheath, inspection revealed no unusual bleeding or hematoma at the site of the entry in the right common carotid artery.  COMPLICATIONS: None.  FINDINGS: As above.  IMPRESSION: Status post endovascular embolization of a large wide necked right internal carotid artery posterior communicating artery region via a right neck carotid approach using a pipeline flow diverter device as described above without event. 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   Electronically Signed   By: [name] M.D.   On: 08/13/2014 16:59    Labs:  CBC:  Recent Labs  08/07/14 1048 08/12/14 1655 08/13/14 0503 08/14/14 0237  WBC 4.3 4.6 6.6 4.8  HGB 12.6 11.2* 10.2* 9.1*  HCT 36.9 33.1* 30.3* 27.9*  PLT 128* 106* 110* 102*    COAGS:  Recent Labs  05/02/14 1313 05/12/14 1450 08/07/14 1048 08/12/14 1615 08/12/14 2050  INR 1.02 1.02 1.01 1.21  --   APTT 29  --  29 >200* 34    BMP:  Recent Labs  05/12/14 1450 08/07/14 1048  08/13/14 0503 08/14/14 0237  NA 138 138 141 144  K 3.6 4.2 3.8 3.4*  CL 100 104 113* 112*  CO2 29 26 24 25   GLUCOSE 93 102* 127* 97  BUN 13 15 13 12   CALCIUM 9.1 8.9 7.2* 7.3*  CREATININE 0.66 0.77 0.69 0.69  GFRNONAA 87* >60 >60 >60  GFRAA >90 >60 >60 >60    LIVER FUNCTION TESTS:  Recent Labs  05/03/14 1910 08/07/14 1048  BILITOT 1.1 0.6  AST 29 20  ALT 20 17  ALKPHOS 42 40  PROT 6.9 6.6  ALBUMIN 3.6 3.5    Assessment and Plan: S/PRTPCOM aneurysm embolization with flow diverter 7/20 on ASA 325mg  daily and Plavix 75mg  daily Stable for discharge when SNF is available-See full discharge summary and note from 08/15/14 F/U in 1 week for p2y12 and 2 week for NIR Follow-up Dr. [name] has seen and examined the patient today.  SignedHedy Jacob 08/16/2014, 11:37 AM

## 2014-08-16 NOTE — Progress Notes (Signed)
    Subjective  - POD #4  No complaints   Physical Exam:  Right neck incision with mild hematoma and ecchymosis.       Assessment/Plan:  POD #4  Stable from a vascular perspective. Dr. Oneida Alar will reevaluate on Monday DC planning for neuroradiology  Annamarie Major 08/16/2014 10:25 AM --  Filed Vitals:   08/16/14 0800  BP: 151/83  Pulse: 63  Temp: 97 F (36.1 C)  Resp: 21    Intake/Output Summary (Last 24 hours) at 08/16/14 1025 Last data filed at 08/16/14 0600  Gross per 24 hour  Intake   2400 ml  Output      0 ml  Net   2400 ml     Laboratory CBC    Component Value Date/Time   WBC 4.8 08/14/2014 0237   HGB 9.1* 08/14/2014 0237   HCT 27.9* 08/14/2014 0237   PLT 102* 08/14/2014 0237    BMET    Component Value Date/Time   NA 144 08/14/2014 0237   K 3.4* 08/14/2014 0237   CL 112* 08/14/2014 0237   CO2 25 08/14/2014 0237   GLUCOSE 97 08/14/2014 0237   BUN 12 08/14/2014 0237   CREATININE 0.69 08/14/2014 0237   CALCIUM 7.3* 08/14/2014 0237   GFRNONAA >60 08/14/2014 0237   GFRAA >60 08/14/2014 0237    COAG Lab Results  Component Value Date   INR 1.21 08/12/2014   INR 1.01 08/07/2014   INR 1.02 05/12/2014   No results found for: PTT  Antibiotics Anti-infectives    Start     Dose/Rate Route Frequency Ordered Stop   08/12/14 0800  ceFAZolin (ANCEF) IVPB 2 g/50 mL premix     2 g 100 mL/hr over 30 Minutes Intravenous To [facility] 08/11/14 1431 08/12/14 0938       V. Leia Alf, M.D. [facility] [place] Office: [phone] Pager:  [phone]

## 2014-08-17 ENCOUNTER — Telehealth: Payer: Self-pay | Admitting: Vascular Surgery

## 2014-08-17 NOTE — Care Management Note (Signed)
Case Management Note  Patient Details  Name: DELORISE HUNKELE MRN: [id number] Date of Birth: 1945-05-24  Subjective/Objective:   PT/OT recommending SNF for rehab at dc.  CSW following to facilitate dc to SNF when medically stable.                  Action/Plan:   Expected Discharge Date:                  Expected Discharge Plan:  Skilled Nursing Facility  In-House Referral:  Clinical Social Work  Discharge planning Services  CM Consult  Post Acute Care Choice:    Choice offered to:     DME Arranged:    DME Agency:     HH Arranged:    [place] Agency:     Status of Service:  Completed, signed off  Medicare Important Message Given:    Date Medicare IM Given:    Medicare IM give by:    Date Additional Medicare IM Given:    Additional Medicare Important Message give by:     If discussed at [place] of Stay Meetings, dates discussed:    Additional Comments:  Ella Bodo, RN 08/17/2014, 9:53 AM

## 2014-08-17 NOTE — Progress Notes (Addendum)
15:45 pt d/c via wheelchair with NT escort. Daughter taking pt over to [place] facility. Pt dressed and daughter took belongings. Facility was previous called, given d/c med information, when to f/u with MDs.   No decubitus noted. Scattered bruises including sacrum. [facility] aware.

## 2014-08-17 NOTE — Progress Notes (Signed)
Feels much better, hoarseness essentially resolved Right neck with ecchymosis otherwise healing To SNF today  Pt needs follow up with me in 2 weeks  Ruta Hinds, MD [facility] [place]: [phone] Pager: [phone]

## 2014-08-17 NOTE — Progress Notes (Signed)
  Progress Note    08/17/2014 8:20 AM 5 Days Post-Op  Subjective:  Ready to leave    Filed Vitals:   08/17/14 0749  BP:   Pulse:   Temp: 98.4 F (36.9 C)  Resp:     Physical Exam: Incisions:  C/d/i with small hematoma and some ecchymosis   CBC    Component Value Date/Time   WBC 4.8 08/14/2014 0237   RBC 3.21* 08/14/2014 0237   HGB 9.1* 08/14/2014 0237   HCT 27.9* 08/14/2014 0237   PLT 102* 08/14/2014 0237   MCV 86.9 08/14/2014 0237   MCH 28.3 08/14/2014 0237   MCHC 32.6 08/14/2014 0237   RDW 14.2 08/14/2014 0237   LYMPHSABS 0.7 08/13/2014 0503   MONOABS 0.5 08/13/2014 0503   EOSABS 0.0 08/13/2014 0503   BASOSABS 0.0 08/13/2014 0503    BMET    Component Value Date/Time   NA 144 08/14/2014 0237   K 3.4* 08/14/2014 0237   CL 112* 08/14/2014 0237   CO2 25 08/14/2014 0237   GLUCOSE 97 08/14/2014 0237   BUN 12 08/14/2014 0237   CREATININE 0.69 08/14/2014 0237   CALCIUM 7.3* 08/14/2014 0237   GFRNONAA >60 08/14/2014 0237   GFRAA >60 08/14/2014 0237    INR    Component Value Date/Time   INR 1.21 08/12/2014 1615     Intake/Output Summary (Last 24 hours) at 08/17/14 0820 Last data filed at 08/17/14 0500  Gross per 24 hour  Intake   1110 ml  Output      0 ml  Net   1110 ml     Assessment:  69 y.o. female is s/p:  Exposure and repair of right common carotid artery  5 Days Post-Op  Plan: -doing well  -right neck does have some ecchymosis and small hematoma-explained to pt that the body with resorb this over time. She is not having any difficulty with breathing or swallowing -to SNF today for rehab -f/u with Dr. Oneida Alar in 2 weeks.   Leontine Locket, PA-C [facility] [phone] 08/17/2014 8:20 AM

## 2014-08-17 NOTE — Progress Notes (Addendum)
Upon removal of PIV for d/c home, site noted to be red, slightly tender and a small amount of yellow/pink purulent drainage at site that has since dried with no further drainage. IV team consulted and advised to fill out SZP and notify MD.  MD notified, RN advised to call PA. PA to come and assess site prior to d/c. [facility] nursing [facility] called and updated. Family awaiting pt's arrival outside for d/c to [facility] and she was also updated by pt. Pt in NAD. Will continue to monitor.   PA came to see site. New dressing applied per PA instruction, no further orders at this time. Puncture sight base has a yellow/white color but no active drainage and site is CDI. Pt to d/c now.   [facility] notified to keep site clean, dry, dry dressing and assess for any further issues.

## 2014-08-17 NOTE — Telephone Encounter (Signed)
LM for pt re appt, dpm °

## 2014-08-17 NOTE — Telephone Encounter (Signed)
-----   Message from Denman George, RN sent at 08/14/2014  4:32 PM EDT ----- Regarding: Zigmund Daniel log; also needs 2 wk. f/u with Dr. Oneida Alar   ----- Message -----    From: Gabriel Earing, PA-C    Sent: 08/14/2014  11:20 AM      To: Vvs Charge Pool  S/p carotid artery exposure for IR case.  F/u with Dr. Oneida Alar in 2 weeks.  Thanks

## 2014-08-17 NOTE — Progress Notes (Addendum)
Nursing report called to [facility] in [place], spoke with [place]. Pt aware of plans to d/c and plans to have daughter pick her up and take her to [facility] within the hour. Spoke with CSW regarding plans as well. Facility aware of pt arriving via private vehicle for admission.

## 2014-08-17 NOTE — Clinical Social Work Note (Signed)
Clinical Social Worker facilitated patient discharge including contacting patient family and facility to confirm patient discharge plans.  Clinical information faxed to facility and family agreeable with plan.  CSW arranged transport with patient daughter to [facility].  RN to call report prior to discharge.  Clinical Social Worker will sign off for now as social work intervention is no longer needed. Please consult [place] again if new need arises.  Barbette Or, [place]

## 2014-08-17 NOTE — Clinical Social Work Placement (Signed)
   CLINICAL SOCIAL WORK PLACEMENT  NOTE  Date:  08/17/2014  Patient Details  Name: Jeanne Lawson MRN: [id number] Date of Birth: 01/20/1946  Clinical Social Work is seeking post-discharge placement for this patient at the [place] level of care (*CSW will initial, date and re-position this form in  chart as items are completed):  Yes   Patient/family provided with [facility] Work Department's list of facilities offering this level of care within the geographic area requested by the patient (or if unable, by the patient's family).  Yes   Patient/family informed of their freedom to choose among providers that offer the needed level of care, that participate in Medicare, Medicaid or managed care program needed by the patient, have an available bed and are willing to accept the patient.  Yes   Patient/family informed of [facility]'s ownership interest in [facility] and [facility], as well as of the fact that they are under no obligation to receive care at these facilities.  PASRR submitted to EDS on 08/15/14     PASRR number received on 08/15/14     Existing PASRR number confirmed on       FL2 transmitted to all facilities in geographic area requested by pt/family on 08/15/14     FL2 transmitted to all facilities within larger geographic area on       Patient informed that his/her managed care company has contracts with or will negotiate with certain facilities, including the following:        Yes   Patient/family informed of bed offers received.  Patient chooses bed at [place], [place]     Physician recommends and patient chooses bed at      Patient to be transferred to [place] [place] on 08/17/14.  Patient to be transferred to facility by [facility]     Patient family notified on 08/17/14 of transfer.  Name of family member notified:  Patient to notify daughter over the phone     PHYSICIAN Please sign FL2, Please  prepare priority discharge summary, including medications     Additional Comment:    Barbette Or, [place]

## 2014-08-17 NOTE — Plan of Care (Signed)
Problem: Phase I Progression Outcomes Goal: Pain controlled with appropriate interventions Outcome: Completed/Met Date Met:  08/17/14 Pain has been controlled this admission with reasonable/appropriate interventions.    Goal: Voiding-avoid urinary catheter unless indicated Outcome: Completed/Met Date Met:  08/17/14 No catheter this admission. Goal: Hemodynamically stable Outcome: Completed/Met Date Met:  08/17/14 Pt has remained hemodynamically stable this admission.  Problem: Phase II Progression Outcomes Goal: Discharge plan in place and appropriate Outcome: Completed/Met Date Met:  08/17/14 Pt will discharge to SNF on 08/17/2014, Pt has been given confirmation of bed available and held for pt.    Goal: Tolerates diet Outcome: Completed/Met Date Met:  08/17/14 Pt has been able to tolerate diet appropriately.    Goal: Activity appropriate for discharge plan Outcome: Adequate for Discharge Pt able to ambulate to bathroom and to and from bed/chair with walker. Pt remains to have some minor Lsided weakness but is aware of her limitations and asks for assistance appropriately.  Goal: Vascular site scale level 0 - I Vascular Site Scale Level 0: No bruising/bleeding/hematoma Level I (Mild): Bruising/Ecchymosis, minimal bleeding/ooozing, palpable hematoma < 3 cm Level II (Moderate): Bleeding not affecting hemodynamic parameters, pseudoaneurysm, palpable hematoma > 3 cm Level III (Severe) Bleeding which affects hemodynamic parameters or retroperitoneal hemorrhage  Outcome: Completed/Met Date Met:  08/17/14 Vascular site has mild hematoma, with bruising. Dr. [name] has evaluated it and deems it fit for discharge per pt report on 08/16/2014.

## 2014-08-17 NOTE — Plan of Care (Signed)
Problem: Phase II Progression Outcomes Goal: Activity appropriate for discharge plan Outcome: Adequate for Discharge Pt is discharging to SNF for additional rehab/therapy for increased ambulation, pt is appropriately ambulating for discharge.

## 2014-08-17 NOTE — Discharge Summary (Signed)
Patient ID: Jeanne Lawson MRN: [id number] DOB/AGE: Nov 23, 1945 69 y.o.  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    Discharged Condition: Good, stable.   Hospital Course: Jeanne Lawson is a 69 y.o. female with previous CVA 2011 and development of dizziness and "leaning to left" symptoms. Her work up revealed cerebral aneurysms and she was then referred to Dr [name] for consult then treatment. On 05/29/14 attempt was made for embolization of Right posterior communicating artery aneurysm with pipeline stent placement, however due to the anatomy of the aortic arch with secondary distortion of the origins of these vessels related to the unfolding of the aortic arch, distal access into the right internal carotid artery could not be established. The procedure was therefore stopped.   She presented back on 08/12/14 for an elective Right PCOM embolization via right carotid access that was established by Vascular surgery. She underwent the procedure with successful embolization with pipeline flow diverter and was admitted for close neurological observation. She was noted to have continued oozing at right carotid access site and ultrasound was performed that revealed resolving neck hematoma and no evidence of carotid pseudoaneurysm. The patient was extubated successfully on 08/13/14 and has since tolerating oral medication and diet without difficulty and oxygenating well on RA. She is ambulating well and has been evaluated by PT/OT who recommends SNF placement since the patient lives by herself. She does admit to headache last night that was relieved with Tylenol. She denies any headache today. She denies any visual changes, denies any new extremity weakness or sensation loss. She  denies any nausea or vomiting. She denies any chest pains or shortness of breath. Her left sided arm and leg weakness are not any worse than prior to procedure. Her P2Y12 today is 53, this has been discussed today with Dr. [name] and she will continue ASA 325mg  daily and Plavix 75mg  daily. Her speech hoarseness is clearing and her foley catheter has been removed and she has urinated well without difficulty. Dr. [name] has seen the patient today and deemed her medically stable for discharge. She will follow-up with Dr. [name] in 2 weeks.    She will go to SNF for continued rehab and physical therapy of residual weakness and deconditioning.  Consults: Anesthesia.   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   Discharge Exam: Blood pressure 145/84, pulse 67, temperature 98.4 F (36.9 C), temperature source Oral, resp. rate 15, height 5\' 1"  (1.549 m), weight 161 lb 6 oz (73.2 kg), SpO2 97 %.  PE: General: A&Ox3, NAD, sitting up in bed drinking coffee.  Heart: RRR without M/G/R Lungs: CTA b/l Neck: Right sided ecchymosis, soft with a smallnodule palpated at site of puncture. no signs of active hematoma or bleeding.  Abd: Soft, NT, ND, (+) BS Ext: LLE edema, Left hand edema, sensation intact.  Neuro: Speech clear, minimal word finding difficulty, No cranial nerve abns noted. Motor. LUE 4/5 with pronation drift. LLE 5/5 Fine motor less fluent on F to N .  Disposition: SNF when available per OT recommendations.       Discharge Instructions    Call MD for:  difficulty breathing, headache or visual disturbances    Complete by:  As directed      Call MD for:  extreme  fatigue    Complete by:  As directed      Call MD for:  hives    Complete by:  As directed      Call MD for:  persistant dizziness or light-headedness    Complete by:  As directed      Call MD for:  persistant nausea and vomiting    Complete by:  As directed      Call MD for:  redness,  tenderness, or signs of infection (pain, swelling, redness, odor or green/yellow discharge around incision site)    Complete by:  As directed      Call MD for:  severe uncontrolled pain    Complete by:  As directed      Call MD for:  temperature >100.4    Complete by:  As directed      Diet - low sodium heart healthy    Complete by:  As directed      Driving Restrictions    Complete by:  As directed   No driving x 2 weeks     Increase activity slowly    Complete by:  As directed      Lifting restrictions    Complete by:  As directed   No lifting over 20 lbs x 2 weeks     Other Restrictions    Complete by:  As directed   No bending or stooping over x 2 weeks            Medication List    STOP taking these medications        aspirin EC 81 MG tablet  Replaced by:  aspirin 325 MG tablet      TAKE these medications        albuterol 108 (90 BASE) MCG/ACT inhaler  Commonly known as:  PROVENTIL HFA;VENTOLIN HFA  Inhale 1-2 puffs into the lungs every 6 (six) hours as needed for wheezing or shortness of breath.     aspirin 325 MG tablet  Take 1 tablet (325 mg total) by mouth daily with breakfast.     atorvastatin 40 MG tablet  Commonly known as:  LIPITOR  Take 1 tablet (40 mg total) by mouth daily at 6 PM.     clopidogrel 75 MG tablet  Commonly known as:  PLAVIX  TAKE 1 TABLET BY MOUTH DAILY.     lisinopril-hydrochlorothiazide 20-12.5 MG per tablet  Commonly known as:  PRINZIDE,ZESTORETIC  Take 1 tablet by mouth daily.     mometasone-formoterol 100-5 MCG/ACT Aero  Commonly known as:  DULERA  Inhale 2 puffs into the lungs 2 (two) times daily.     TOPROL XL 50 MG 24 hr tablet  Generic drug:  metoprolol succinate  TAKE 1/2 TABLET BY MOUTH TWICE DAILY.       Follow-up Information    Follow up with Ruta Hinds, MD In 2 weeks.   Specialties:  Vascular Surgery, Cardiology   Why:  Office will call you to arrange your appt (sent)   Contact information:   [place] [address] [phone]       Follow up with DEVESHWAR, Fritz Pickerel, MD In 2 weeks.   Specialty:  Interventional Radiology   Why:  Our office will call patient with appointment [phone]   Contact information:   [address] [place] [address] [phone]        Signed: Ascencion Dike 08/17/2014, 11:40 AM

## 2014-08-26 ENCOUNTER — Other Ambulatory Visit: Payer: Self-pay | Admitting: Radiology

## 2014-08-26 ENCOUNTER — Ambulatory Visit (HOSPITAL_COMMUNITY)
Admission: RE | Admit: 2014-08-26 | Discharge: 2014-08-26 | Disposition: A | Discharge status: Home / Self Care | Payer: Medicare Other | Source: Ambulatory Visit | Attending: Radiology | Admitting: Radiology

## 2014-08-26 DIAGNOSIS — I729 Aneurysm of unspecified site: Secondary | ICD-10-CM | POA: Insufficient documentation

## 2014-08-26 LAB — PLATELET INHIBITION P2Y12: Platelet Function  P2Y12: 9 [PRU] — ABNORMAL LOW (ref 194–418)

## 2014-08-28 ENCOUNTER — Other Ambulatory Visit (HOSPITAL_COMMUNITY): Payer: Self-pay | Admitting: Interventional Radiology

## 2014-08-28 DIAGNOSIS — I671 Cerebral aneurysm, nonruptured: Secondary | ICD-10-CM

## 2014-09-03 ENCOUNTER — Encounter: Payer: Medicare Other | Admitting: Vascular Surgery

## 2014-09-09 ENCOUNTER — Encounter: Payer: Self-pay | Admitting: Vascular Surgery

## 2014-09-10 ENCOUNTER — Encounter: Payer: Self-pay | Admitting: Vascular Surgery

## 2014-09-10 ENCOUNTER — Ambulatory Visit (INDEPENDENT_AMBULATORY_CARE_PROVIDER_SITE_OTHER): Payer: Medicare Other | Admitting: Vascular Surgery

## 2014-09-10 VITALS — BP 150/66 | HR 51 | Temp 97.6°F | Ht 61.0 in | Wt 151.2 lb

## 2014-09-10 DIAGNOSIS — I671 Cerebral aneurysm, nonruptured: Secondary | ICD-10-CM

## 2014-09-10 NOTE — Progress Notes (Signed)
Patient is a 69 year old female returns for postoperative follow-up today. She had exposure and repair of her right common carotid artery for sheath access for Dr. [name] for 4 coiling of an intracranial aneurysm. She reports feeling well overall. She is still working on rehabilitation. She has still some mild weakness in her voice but this is improving. She has no difficulty swallowing. She has no significant focal neuro deficits.  Filed Vitals:   09/10/14 1422  BP: 150/66  Pulse: 51  Temp: 97.6 F (36.4 C)  TempSrc: Oral  Height: 5\' 1"  (1.549 m)  Weight: 151 lb 3.2 oz (68.584 kg)  SpO2: 98%    Right neck well-healed incision 2+ carotid pulses  Assessment: Doing well status post exposure right common carotid artery.  Plan: We'll be available for exposure of the left common carotid artery if Dr [name] needs that in the future. Otherwise the patient will follow-up on as-needed basis.  Ruta Hinds, MD [facility] [place] Office: [phone] Pager: [phone]

## 2014-10-07 ENCOUNTER — Ambulatory Visit (HOSPITAL_COMMUNITY)
Admission: RE | Admit: 2014-10-07 | Discharge: 2014-10-07 | Disposition: A | Discharge status: Home / Self Care | Payer: Medicare Other | Source: Ambulatory Visit | Attending: Interventional Radiology | Admitting: Interventional Radiology

## 2014-10-07 DIAGNOSIS — I671 Cerebral aneurysm, nonruptured: Secondary | ICD-10-CM

## 2014-10-16 ENCOUNTER — Telehealth (HOSPITAL_COMMUNITY): Payer: Self-pay | Admitting: Interventional Radiology

## 2014-10-16 NOTE — Telephone Encounter (Signed)
Called pt to give her optional dates for her upcoming procedure in October. I told her that as of right now we had 11/11/14, 11/16/14, 11/18/14, 11/23/14, or 11/25/14. Pt says she will speak w/ her daughters and decide which of these dates work best for them. I will call her back on 10/19/14 to find out their plan. JM

## 2014-10-23 ENCOUNTER — Other Ambulatory Visit: Payer: Self-pay | Admitting: Cardiology

## 2014-11-03 ENCOUNTER — Telehealth (HOSPITAL_COMMUNITY): Payer: Self-pay | Admitting: Interventional Radiology

## 2014-11-03 NOTE — Telephone Encounter (Signed)
Called pt to further discuss scheduling her next aneurysm treatment. We had initially talked about her having her procedure on 11/16/14 but Dr. Ruta Hinds cannot do on that day. We are considering now doing 11/18/14. I told the patient that I would let her know as soon as Colletta Maryland (Dr. Oneida Alar RN) calls me back to confirm the date of 11/16/14. Pt was in agreement with this plan of care and understood this. JM

## 2014-11-04 ENCOUNTER — Telehealth (HOSPITAL_COMMUNITY): Payer: Self-pay | Admitting: Interventional Radiology

## 2014-11-04 NOTE — Telephone Encounter (Signed)
Called pt, told her that I am still waiting to hear back from [place] at Dr. Nona Dell office to know if he will be able to perform his part of her surgery on 11/18/14 that day. She states understanding and agrees with this plan. JM

## 2014-11-05 ENCOUNTER — Other Ambulatory Visit: Payer: Self-pay

## 2014-11-06 ENCOUNTER — Telehealth: Payer: Self-pay | Admitting: Vascular Surgery

## 2014-11-06 NOTE — Telephone Encounter (Signed)
-----   Message from Gregery Na, RN sent at 11/04/2014  1:00 PM EDT ----- Regarding: Eval/Office visit Please see Dr. Oneida Alar note below. Thanks!  Colletta Maryland ----- Message -----    From: Elam Dutch, MD    Sent: 11/04/2014  12:46 PM      To: Gregery Na, RN  Yes see in office prior.  Also have ENT see pt to eval vocal cords prior to office visit  Ruta Hinds ----- Message -----    From: Gregery Na, RN    Sent: 11/03/2014   4:24 PM      To: Elam Dutch, MD  Dr. [name] is requesting you to expose Ms. Ess' (L) carotid artery on Wednesday, 10/26. Looks like the (R) was performed on 08/12/14. If so, would you need to see her in the office prior? Last OV was 09/10/14. Thanks.  Colletta Maryland

## 2014-11-06 NOTE — Telephone Encounter (Signed)
Spoke with pt.  Dr Erik Obey at [facility] 10/17 @ 9:15am [address] Dr Oneida Alar on 10/19 @ 11:00am- pt can't come at 1:45  Pt aware, dpm

## 2014-11-10 ENCOUNTER — Other Ambulatory Visit: Payer: Self-pay

## 2014-11-10 ENCOUNTER — Encounter: Payer: Self-pay | Admitting: Vascular Surgery

## 2014-11-11 ENCOUNTER — Ambulatory Visit (INDEPENDENT_AMBULATORY_CARE_PROVIDER_SITE_OTHER): Payer: Medicare Other | Admitting: Vascular Surgery

## 2014-11-11 ENCOUNTER — Encounter: Payer: Self-pay | Admitting: Vascular Surgery

## 2014-11-11 VITALS — BP 109/69 | HR 62 | Temp 97.7°F | Resp 16 | Ht 61.0 in | Wt 154.0 lb

## 2014-11-11 DIAGNOSIS — I671 Cerebral aneurysm, nonruptured: Secondary | ICD-10-CM | POA: Diagnosis not present

## 2014-11-11 NOTE — Progress Notes (Signed)
Filed Vitals:   11/11/14 1055 11/11/14 1103  BP: 152/90 109/69  Pulse: 56 62  Temp: 97.7 F (36.5 C)   TempSrc: Oral   Resp: 16   Height: 5\' 1"  (1.549 m)   Weight: 154 lb (69.854 kg)   SpO2: 97%

## 2014-11-11 NOTE — Progress Notes (Signed)
[facility]  HISTORY AND PHYSICAL    History of Present Illness:  Patient is a 69 year old female who presents for evaluation of her carotid arteries in consideration of open exposure of the left common carotid artery for access for coiling of a left intracranial aneurysm. She has known intracranial aneurysms. Dr. [name] is currently evaluating her for an endovascular intervention. However due to tortuosity and shape of her aortic arch a femoral approach is not possible. He has asked me to see the patient for possibility of exposing the left common carotid artery for exposure for his intervention. Patient previously underwent a similar procedure a couple of months ago. She had some hoarseness post procedure. She has some complaints over irritation from her previous right neck incision but overall this is well-healed. She was recently evaluated by Dr.Wolicki and found to have normal vocal cord function. Patient has had a prior stroke thought to be related to her aneurysms. She has some residual left arm weakness. Other residual effect is a sensation of a sandpaper type feel to her left hand. 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 She has had no other neck operations. She is currently on Plavix and aspirin. Other medical problems include  hypertension with left ventricular hypertrophy. These are both currently stable.    Past Medical History   Diagnosis  Date   .  Ventricular hypertrophy  04/2009       LVH with diastolic dysfunction by echo. Has normal EF.   Marland Kitchen  Pancreatitis         x2   .  PAC (premature atrial contraction)     .  TIA (transient ischemic attack)         She was hospitalized 04-23-09 through 04-27-09 for involving right side of the body   .  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  Hypertension      .  Tobacco abuse         Ongoing    .  Edema of foot         She has a history of chronic edema of the left dated back to age 34 when she suufered severe frostbite playing  on the snow as a child   .  Coronary artery disease  1996       Known with prior mild lesion of LAD demonstrated by Cardiac Catheterization in 1996   .  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   Marland Kitchen  COPD (chronic obstructive pulmonary disease)         Past Surgical History   Procedure  Laterality  Date   .  Vesicovaginal fistula closure w/ tah    25 yrs ago   .  Neck surgery    50 yrs ago       Left side   .  US echocardiography    04-26-2009       EF 65-70%   .  Cardiovascular stress test    12-02-2001       EF 70%   .  Cardiac catheterization    1996  Mild CAD with vasospasm   .  Abdominal hysterectomy       .  Laparoscopy       .  Radiology with anesthesia  N/A  05/25/2014       Procedure: RADIOLOGY WITH ANESTHESIA;  Surgeon: [name], MD;  Location: [facility];  Service: Radiology;  Laterality: N/A;     Social History History   Substance Use Topics   .  Smoking status:  Former Smoker -- 1.00 packs/day for 30 years       Types:  Cigarettes       Quit date:  04/26/2014   .  Smokeless tobacco:  Never Used   .  Alcohol Use:  No     Family History Family History   Problem  Relation  Age of Onset   .  Emphysema  Father     .  Aneurysm  Sister     .  Stroke  Mother     .  Hypertension  Mother     .  Hypertension  Daughter     .  Thyroid disease  Daughter       Allergies    Allergies   Allergen  Reactions   .  Dilaudid [Hydromorphone]  Swelling   .  Codeine  Nausea And Vomiting   .  Hydromorphone Hcl  Swelling   .  Sulfa Drugs Cross Reactors  Other (See Comments)       Unknown childhood reaction        Current Outpatient Prescriptions   Medication  Sig  Dispense  Refill   .  albuterol (PROVENTIL  HFA;VENTOLIN HFA) 108 (90 BASE) MCG/ACT inhaler  Inhale 1-2 puffs into the lungs every 6 (six) hours as needed for wheezing or shortness of breath.       Marland Kitchen  aspirin EC 81 MG tablet  Take 81 mg by mouth daily.       Marland Kitchen  atorvastatin (LIPITOR) 40 MG tablet  Take 1 tablet (40 mg total) by mouth daily at 6 PM.  30 tablet  3   .  clopidogrel (PLAVIX) 75 MG tablet  TAKE 1 TABLET BY MOUTH DAILY.  30 tablet  5   .  lisinopril-hydrochlorothiazide (PRINZIDE,ZESTORETIC) 20-12.5 MG per tablet  Take 1 tablet by mouth daily.  30 tablet  3   .  TOPROL XL 50 MG 24 hr tablet  TAKE 1/2 TABLET BY MOUTH TWICE DAILY.  30 tablet  3   .  mometasone-formoterol (DULERA) 100-5 MCG/ACT AERO  Inhale 2 puffs into the lungs 2 (two) times daily. (Patient not taking: Reported on 05/15/2014)  1 Inhaler  0      No current facility-administered medications for this visit.     ROS:    General:  No weight loss, Fever, chills  HEENT: No recent headaches, no nasal bleeding, no visual changes, no sore throat  Neurologic: No dizziness, blackouts, seizures. + recent symptoms of stroke or mini- stroke. No recent episodes of slurred speech, or temporary blindness.  Cardiac: No recent episodes of chest pain/pressure, no shortness of breath at rest.  No shortness of breath with exertion.  Denies history of atrial fibrillation or irregular heartbeat  Vascular: No history of rest pain in feet.  No history of claudication.  No history of non-healing ulcer, No history of DVT    Pulmonary: No home oxygen, no productive cough, no hemoptysis,  No asthma or wheezing  Musculoskeletal:  [ ]  Arthritis, [ ]  Low back  pain,  [ ]  Joint pain  Hematologic:No history of hypercoagulable state.  No history of easy bleeding.  No history of anemia  Gastrointestinal: No hematochezia or melena,  No gastroesophageal reflux, no trouble swallowing  Urinary: [ ]  chronic Kidney disease, [ ]  on HD - [ ]  MWF or [ ]  TTHS, [ ]  Burning with urination, [ ]  Frequent  urination, [ ]  Difficulty urinating;    Skin: No rashes  Psychological: No history of anxiety,  No history of depression   Physical Examination    Filed Vitals:   11/11/14 1055 11/11/14 1103  BP: 152/90 109/69  Pulse: 56 62  Temp: 97.7 F (36.5 C)   TempSrc: Oral   Resp: 16   Height: 5\' 1"  (1.549 m)   Weight: 154 lb (69.854 kg)   SpO2: 97%     General:  Alert and oriented, no acute distress HEENT: Normal Neck: No bruit or JVD, well-healed neck incision base of right neck Pulmonary: Clear to auscultation bilaterally Cardiac: Regular Rate and Rhythm without murmur Abdomen: Soft, non-tender, non-distended, no mass Skin: No rash Extremity Pulses:  2+ radial, brachial, femoral, dorsalis pedis, posterior tibial pulses bilaterally Musculoskeletal: No deformity or edema     Neurologic: Upper and lower extremity motor 5/5 and symmetric, subtle left hand intrinsic weakness  DATA:  Carotid duplex scan was performed previously which showed less than 50% stenosis bilaterally. I reviewed and interpreted this study.   ASSESSMENT:  I discussed with the patient today that I would be involved in her procedure with exposing the left common carotid artery. Risks benefits complications included but not limited to bleeding infection cranial nerve injury remote risk of stroke but I did emphasize to her that there would be risk of stroke with Dr. Benny Lennert procedure.   PLAN:  left common carotid exposure for interventional radiology procedure on 11/18/2014. The patient's exposure of her common carotid artery and sheath placement will be performed in operating room 16. The patient will then be transported to interventional radiology for her embolization procedure. She will then return back to operating room 16 for closure of the incision at the conclusion of Dr. Arlean Hopping procedure.  Ruta Hinds, MD [facility] [place] Office: [phone] Pager: [phone]

## 2014-11-12 ENCOUNTER — Other Ambulatory Visit (HOSPITAL_COMMUNITY): Payer: Self-pay | Admitting: Interventional Radiology

## 2014-11-12 ENCOUNTER — Encounter: Payer: Self-pay | Admitting: Otolaryngology

## 2014-11-12 DIAGNOSIS — I671 Cerebral aneurysm, nonruptured: Secondary | ICD-10-CM

## 2014-11-12 NOTE — Pre-Procedure Instructions (Signed)
Jeanne Lawson  11/12/2014     Your procedure is scheduled on : Wednesday November 18, 2014 at 8:30 AM.  Report to [facility] Admitting at 6:30 A.M.  Call this number if you have problems the morning of surgery: [phone]    Remember:  Do not eat food or drink liquids after midnight.  Take these medicines the morning of surgery with A SIP OF WATER : Albuterol inhaler if needed, Toprol XL   Please follow your physicians instructions regarding Plavix   Stop taking any vitamins, herbal medications, Ibuprofen, Advil, Motrin, Aleve   Do not wear jewelry, make-up or nail polish.  Do not wear lotions, powders, or perfumes.    Do not shave 48 hours prior to surgery.    Do not bring valuables to the hospital.  [facility] is not responsible for any belongings or valuables.  Contacts, dentures or bridgework may not be worn into surgery.  Leave your suitcase in the car.  After surgery it may be brought to your room.  For patients admitted to the hospital, discharge time will be determined by your treatment team.  Patients discharged the day of surgery will not be allowed to drive home.   Name and phone number of your driver:    Special instructions:  Shower using CHG soap the night before and the morning of your surgery  Please read over the following fact sheets that you were given. Pain Booklet, Coughing and Deep Breathing, Blood Transfusion Information, MRSA Information and Surgical Site Infection Prevention

## 2014-11-13 ENCOUNTER — Encounter (HOSPITAL_COMMUNITY)
Admission: RE | Admit: 2014-11-13 | Discharge: 2014-11-13 | Disposition: A | Discharge status: Home / Self Care | Payer: Medicare Other | Source: Ambulatory Visit | Attending: Vascular Surgery | Admitting: Vascular Surgery

## 2014-11-13 ENCOUNTER — Encounter (HOSPITAL_COMMUNITY): Payer: Self-pay

## 2014-11-13 DIAGNOSIS — Z7982 Long term (current) use of aspirin: Secondary | ICD-10-CM | POA: Insufficient documentation

## 2014-11-13 DIAGNOSIS — Z01818 Encounter for other preprocedural examination: Secondary | ICD-10-CM | POA: Diagnosis not present

## 2014-11-13 DIAGNOSIS — R600 Localized edema: Secondary | ICD-10-CM | POA: Diagnosis not present

## 2014-11-13 DIAGNOSIS — I671 Cerebral aneurysm, nonruptured: Secondary | ICD-10-CM | POA: Insufficient documentation

## 2014-11-13 DIAGNOSIS — Z01812 Encounter for preprocedural laboratory examination: Secondary | ICD-10-CM | POA: Diagnosis not present

## 2014-11-13 DIAGNOSIS — Z7902 Long term (current) use of antithrombotics/antiplatelets: Secondary | ICD-10-CM | POA: Diagnosis not present

## 2014-11-13 DIAGNOSIS — I10 Essential (primary) hypertension: Secondary | ICD-10-CM | POA: Insufficient documentation

## 2014-11-13 DIAGNOSIS — Z8673 Personal history of transient ischemic attack (TIA), and cerebral infarction without residual deficits: Secondary | ICD-10-CM | POA: Insufficient documentation

## 2014-11-13 DIAGNOSIS — Z0183 Encounter for blood typing: Secondary | ICD-10-CM | POA: Diagnosis not present

## 2014-11-13 DIAGNOSIS — Z79899 Other long term (current) drug therapy: Secondary | ICD-10-CM | POA: Diagnosis not present

## 2014-11-13 DIAGNOSIS — J449 Chronic obstructive pulmonary disease, unspecified: Secondary | ICD-10-CM | POA: Insufficient documentation

## 2014-11-13 DIAGNOSIS — Z87891 Personal history of nicotine dependence: Secondary | ICD-10-CM | POA: Insufficient documentation

## 2014-11-13 DIAGNOSIS — I2510 Atherosclerotic heart disease of native coronary artery without angina pectoris: Secondary | ICD-10-CM | POA: Insufficient documentation

## 2014-11-13 LAB — URINALYSIS, ROUTINE W REFLEX MICROSCOPIC
BILIRUBIN URINE: NEGATIVE
Glucose, UA: NEGATIVE mg/dL
Hgb urine dipstick: NEGATIVE
Ketones, ur: NEGATIVE mg/dL
Leukocytes, UA: NEGATIVE
NITRITE: NEGATIVE
PROTEIN: NEGATIVE mg/dL
SPECIFIC GRAVITY, URINE: 1.028 (ref 1.005–1.030)
UROBILINOGEN UA: 1 mg/dL (ref 0.0–1.0)
pH: 6 (ref 5.0–8.0)

## 2014-11-13 LAB — COMPREHENSIVE METABOLIC PANEL
ALBUMIN: 3.4 g/dL — AB (ref 3.5–5.0)
ALK PHOS: 41 U/L (ref 38–126)
ALT: 11 U/L — AB (ref 14–54)
AST: 19 U/L (ref 15–41)
Anion gap: 7 (ref 5–15)
BILIRUBIN TOTAL: 0.7 mg/dL (ref 0.3–1.2)
BUN: 13 mg/dL (ref 6–20)
CALCIUM: 8.6 mg/dL — AB (ref 8.9–10.3)
CO2: 26 mmol/L (ref 22–32)
Chloride: 106 mmol/L (ref 101–111)
Creatinine, Ser: 0.8 mg/dL (ref 0.44–1.00)
GFR calc Af Amer: >60 mL/min (ref >60)
GFR calc non Af Amer: >60 mL/min (ref >60)
GLUCOSE: 94 mg/dL (ref 65–99)
Potassium: 4 mmol/L (ref 3.5–5.1)
Sodium: 139 mmol/L (ref 135–145)
TOTAL PROTEIN: 6.6 g/dL (ref 6.5–8.1)

## 2014-11-13 LAB — SURGICAL PCR SCREEN
MRSA, PCR: NEGATIVE
STAPHYLOCOCCUS AUREUS: NEGATIVE

## 2014-11-13 LAB — APTT: APTT: 26 s (ref 24–37)

## 2014-11-13 LAB — CBC
HEMATOCRIT: 36.7 % (ref 36.0–46.0)
HEMOGLOBIN: 12.6 g/dL (ref 12.0–15.0)
MCH: 28.6 pg (ref 26.0–34.0)
MCHC: 34.3 g/dL (ref 30.0–36.0)
MCV: 83.4 fL (ref 78.0–100.0)
Platelets: 124 10*3/uL — ABNORMAL LOW (ref 150–400)
RBC: 4.4 MIL/uL (ref 3.87–5.11)
RDW: 14.1 % (ref 11.5–15.5)
WBC: 5.2 10*3/uL (ref 4.0–10.5)

## 2014-11-13 LAB — PROTIME-INR
INR: 1.09 (ref 0.00–1.49)
Prothrombin Time: 14.3 seconds (ref 11.6–15.2)

## 2014-11-13 LAB — TYPE AND SCREEN
ABO/RH(D): A POS
Antibody Screen: NEGATIVE

## 2014-11-13 LAB — PLATELET INHIBITION P2Y12: Platelet Function  P2Y12: 120 [PRU] — ABNORMAL LOW (ref 194–418)

## 2014-11-13 NOTE — Progress Notes (Signed)
Patient denied having any acute cardiac or pulmonary issues, but informed Nurse the last time she used Albuterol inhaler was "a couple weeks ago."  Nurse inquired about Plavix, and patient stated she was not told to stop taking Plavix or aspirin, however patient is going to see Dr. Kathi Ludwig today and she stated she would ask him if she needed to take Plavix and aspirin on the morning of her surgery.

## 2014-11-16 ENCOUNTER — Other Ambulatory Visit: Payer: Self-pay | Admitting: Radiology

## 2014-11-16 NOTE — Progress Notes (Signed)
Anesthesia Chart Review: Patient is a 69 year old female posted for 1) left CCA exposure (Dr. 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 [name] on 11/18/14. She is s/p similar procedure on 08/12/14 when she underwent endovascular treatment of a large RICA posterior communicating region aneurysm. She was keep intubated post-procedure for airway protection since she had a neck incision and was on anti-platelet therapy with at least short term heparin post-operatively. She has a small focal post-operative hematoma, but was extubated successfully on 08/13/14. Notes indicate she also had some hoarsenss and was seen by ENT Dr. Erik Obey and "found to have normal vocal cord function." Of note, previous transfemoral approach for treatment of her aneurysms was attempted on 05/25/14 but had to be aborted due to severe tortuosity of the morphology of the aortic arch and proximal RCCA and the origin of the innominate artery.(See my note from 08/07/14.)  Other history includes former smoker (quit 04/26/14), post-operative N/V, pancreatitis, PACs/PAF, LVH with diastolic dysfunction (no LVH, grade 1 diastolic dysfunction 08/1155 echo), reportedly "mild" LAD disease by '96 cath, COPD, HTN, (non-hemorrhagic) CVA with left sided weakness 10/2008 and 04/2014. She developed hoarseness and would have intermittent periods of SOB after her 04/2014 CVA. Reportedly she was told that these symptoms could be related to her CVA and should improve over time.She has chronic BLE edema (LLE since childhood after sustaining frostbite at age 43 and RLE following her 50 CVA).   PCP is Dr. [name]. Cardiologist is Dr. Mare Ferrari. He last saw patient on 05/18/14 and felt patient was okay to proceed with neuro IR intervention. According to his note, "She has a history of prior coronary artery disease and 16 years ago Dr. Ilda Foil did cardiac catheterization and angioplasty  but no stent. We don't have those records. The patient has had several subsequent Cardiolite stress tests which have been normal but have been poorly tolerated by the patient and she does not want any further nuclear stress tests. She does have a chronically abnormal EKG with anterolateral T wave inversion. Since last visit she's been doing well with no new cardiac symptoms."  Meds include albuterol, Lipitor, ASA, Plavix, Zestoretic, Toprol.   05/02/14 EKG: SR, lateral T wave abnormality, consider ischemia. Lateral T wave abnormality also present on previous EKG from 11/18/13.  05/04/14 Echo: Study Conclusions - Left ventricle: The cavity size was normal. Wall thickness was normal. Systolic function was normal. The estimated ejection fraction was in the range of 55% to 60%. Doppler parameters are consistent with abnormal left ventricular relaxation (grade 1 diastolic dysfunction). - Mitral valve: Calcified annulus.  06/25/14 Carotid duplex: 1-49% BICA stenosis, lower end of range.  05/02/14 CXR: IMPRESSION: 1. Stable cardiomegaly without failure. 2. Emphysema.  Preoperative labs noted.   Patient tolerated similar surgery three months ago. If no acute changes then I anticipate that she can proceed as planned.  George Hugh [facility] Short Stay [facility]/Anesthesiology Phone [phone] 11/16/2014 2:53 PM

## 2014-11-17 ENCOUNTER — Other Ambulatory Visit: Payer: Self-pay | Admitting: Physician Assistant

## 2014-11-17 ENCOUNTER — Other Ambulatory Visit: Payer: Self-pay | Admitting: Radiology

## 2014-11-17 MED ORDER — SODIUM CHLORIDE 0.9 % IJ SOLN: 3.0000 mL | INTRAMUSCULAR | Status: DC | PRN

## 2014-11-17 MED ORDER — DEXTROSE 5 % IV SOLN
1.5000 g | INTRAVENOUS | Status: AC
  Administered 2014-11-18 (×2): 1.5 g via INTRAVENOUS
  Filled 2014-11-17: qty 1.5

## 2014-11-17 MED ORDER — CHLORHEXIDINE GLUCONATE CLOTH 2 % EX PADS: 6.0000 | MEDICATED_PAD | Freq: Once | CUTANEOUS | Status: DC

## 2014-11-17 MED ORDER — SODIUM CHLORIDE 0.9 % IV SOLN: INTRAVENOUS | Status: DC

## 2014-11-17 MED ORDER — CEFAZOLIN SODIUM-DEXTROSE 2-3 GM-% IV SOLR
2.0000 g | INTRAVENOUS | Status: DC
  Filled 2014-11-17 (×2): qty 50

## 2014-11-18 ENCOUNTER — Encounter (HOSPITAL_COMMUNITY)
Admission: RE | Disposition: A | Discharge status: Skilled Nursing Facility | Payer: Self-pay | Source: Ambulatory Visit | Attending: Interventional Radiology

## 2014-11-18 ENCOUNTER — Inpatient Hospital Stay (HOSPITAL_COMMUNITY)
Admission: RE | Admit: 2014-11-18 | Discharge: 2014-11-24 | DRG: 027 | Disposition: A | Discharge status: Skilled Nursing Facility | Payer: Medicare Other | Source: Ambulatory Visit | Attending: Interventional Radiology | Admitting: Interventional Radiology

## 2014-11-18 ENCOUNTER — Inpatient Hospital Stay (HOSPITAL_COMMUNITY): Payer: Medicare Other | Admitting: Anesthesiology

## 2014-11-18 ENCOUNTER — Inpatient Hospital Stay (HOSPITAL_COMMUNITY): Payer: Medicare Other

## 2014-11-18 ENCOUNTER — Encounter (HOSPITAL_COMMUNITY): Payer: Self-pay | Admitting: *Deleted

## 2014-11-18 ENCOUNTER — Inpatient Hospital Stay (HOSPITAL_COMMUNITY)
Admission: RE | Admit: 2014-11-18 | Discharge: 2014-11-18 | Disposition: A | Discharge status: Home / Self Care | Payer: Medicare Other | Source: Ambulatory Visit | Attending: Interventional Radiology | Admitting: Interventional Radiology

## 2014-11-18 DIAGNOSIS — J9601 Acute respiratory failure with hypoxia: Secondary | ICD-10-CM

## 2014-11-18 DIAGNOSIS — J9690 Respiratory failure, unspecified, unspecified whether with hypoxia or hypercapnia: Secondary | ICD-10-CM

## 2014-11-18 DIAGNOSIS — D696 Thrombocytopenia, unspecified: Secondary | ICD-10-CM | POA: Diagnosis present

## 2014-11-18 DIAGNOSIS — E876 Hypokalemia: Secondary | ICD-10-CM | POA: Diagnosis not present

## 2014-11-18 DIAGNOSIS — I671 Cerebral aneurysm, nonruptured: Secondary | ICD-10-CM | POA: Diagnosis present

## 2014-11-18 DIAGNOSIS — Z87891 Personal history of nicotine dependence: Secondary | ICD-10-CM

## 2014-11-18 DIAGNOSIS — T148XXA Other injury of unspecified body region, initial encounter: Secondary | ICD-10-CM

## 2014-11-18 DIAGNOSIS — I2510 Atherosclerotic heart disease of native coronary artery without angina pectoris: Secondary | ICD-10-CM | POA: Diagnosis present

## 2014-11-18 DIAGNOSIS — Z23 Encounter for immunization: Secondary | ICD-10-CM | POA: Diagnosis not present

## 2014-11-18 DIAGNOSIS — J449 Chronic obstructive pulmonary disease, unspecified: Secondary | ICD-10-CM | POA: Diagnosis present

## 2014-11-18 DIAGNOSIS — Z7902 Long term (current) use of antithrombotics/antiplatelets: Secondary | ICD-10-CM

## 2014-11-18 DIAGNOSIS — Z8673 Personal history of transient ischemic attack (TIA), and cerebral infarction without residual deficits: Secondary | ICD-10-CM

## 2014-11-18 DIAGNOSIS — I10 Essential (primary) hypertension: Secondary | ICD-10-CM | POA: Diagnosis not present

## 2014-11-18 DIAGNOSIS — Z978 Presence of other specified devices: Secondary | ICD-10-CM

## 2014-11-18 DIAGNOSIS — J9600 Acute respiratory failure, unspecified whether with hypoxia or hypercapnia: Secondary | ICD-10-CM | POA: Diagnosis not present

## 2014-11-18 DIAGNOSIS — Z7982 Long term (current) use of aspirin: Secondary | ICD-10-CM

## 2014-11-18 DIAGNOSIS — D649 Anemia, unspecified: Secondary | ICD-10-CM | POA: Diagnosis present

## 2014-11-18 DIAGNOSIS — Z789 Other specified health status: Secondary | ICD-10-CM | POA: Diagnosis not present

## 2014-11-18 DIAGNOSIS — I119 Hypertensive heart disease without heart failure: Secondary | ICD-10-CM | POA: Diagnosis present

## 2014-11-18 DIAGNOSIS — I720 Aneurysm of carotid artery: Secondary | ICD-10-CM

## 2014-11-18 HISTORY — PX: ENDARTERECTOMY: SHX5162

## 2014-11-18 LAB — CBC
HEMATOCRIT: 31.6 % — AB (ref 36.0–46.0)
Hemoglobin: 10.7 g/dL — ABNORMAL LOW (ref 12.0–15.0)
MCH: 28.2 pg (ref 26.0–34.0)
MCHC: 33.9 g/dL (ref 30.0–36.0)
MCV: 83.2 fL (ref 78.0–100.0)
PLATELETS: 103 10*3/uL — AB (ref 150–400)
RBC: 3.8 MIL/uL — ABNORMAL LOW (ref 3.87–5.11)
RDW: 14.2 % (ref 11.5–15.5)
WBC: 4.2 10*3/uL (ref 4.0–10.5)

## 2014-11-18 LAB — TRIGLYCERIDES: TRIGLYCERIDES: 118 mg/dL (ref <150)

## 2014-11-18 LAB — BASIC METABOLIC PANEL
Anion gap: 10 (ref 5–15)
BUN: 11 mg/dL (ref 6–20)
CALCIUM: 8.3 mg/dL — AB (ref 8.9–10.3)
CO2: 25 mmol/L (ref 22–32)
CREATININE: 0.63 mg/dL (ref 0.44–1.00)
Chloride: 104 mmol/L (ref 101–111)
GFR calc Af Amer: >60 mL/min (ref >60)
GLUCOSE: 117 mg/dL — AB (ref 65–99)
POTASSIUM: 3.7 mmol/L (ref 3.5–5.1)
SODIUM: 139 mmol/L (ref 135–145)

## 2014-11-18 LAB — POCT ACTIVATED CLOTTING TIME
ACTIVATED CLOTTING TIME: 202 s
ACTIVATED CLOTTING TIME: 196 s
ACTIVATED CLOTTING TIME: 202 s

## 2014-11-18 LAB — LACTIC ACID, PLASMA: LACTIC ACID, VENOUS: 1.7 mmol/L (ref 0.5–2.0)

## 2014-11-18 LAB — BLOOD GAS, ARTERIAL
Acid-Base Excess: 0.7 mmol/L (ref 0.0–2.0)
BICARBONATE: 24.5 meq/L — AB (ref 20.0–24.0)
Drawn by: 28098
FIO2: 1
LHR: 18 {breaths}/min
MECHVT: 420 mL
O2 Saturation: 100 %
PATIENT TEMPERATURE: 98.6
PEEP: 5 cmH2O
PO2 ART: 373 mmHg — AB (ref 80.0–100.0)
TCO2: 25.7 mmol/L (ref 0–100)
pCO2 arterial: 37.6 mmHg (ref 35.0–45.0)
pH, Arterial: 7.43 (ref 7.350–7.450)

## 2014-11-18 LAB — APTT: APTT: 86 s — AB (ref 24–37)

## 2014-11-18 LAB — PROCALCITONIN

## 2014-11-18 SURGERY — ENDARTERECTOMY, CAROTID
Anesthesia: General | Site: Neck | Laterality: Left

## 2014-11-18 SURGERY — RADIOLOGY WITH ANESTHESIA
Anesthesia: General

## 2014-11-18 SURGERY — ENDARTERECTOMY, CAROTID
Anesthesia: General

## 2014-11-18 MED ORDER — 0.9 % SODIUM CHLORIDE (POUR BTL) OPTIME
TOPICAL | Status: DC | PRN
  Administered 2014-11-18: 1000 mL

## 2014-11-18 MED ORDER — FENTANYL CITRATE (PF) 100 MCG/2ML IJ SOLN: 25.0000 ug | INTRAMUSCULAR | Status: DC | PRN

## 2014-11-18 MED ORDER — ACETAMINOPHEN 500 MG PO TABS: 1000.0000 mg | ORAL_TABLET | Freq: Four times a day (QID) | ORAL | Status: DC | PRN

## 2014-11-18 MED ORDER — PROPOFOL 1000 MG/100ML IV EMUL
0.0000 ug/kg/min | INTRAVENOUS | Status: DC
  Administered 2014-11-18: 50 ug/kg/min via INTRAVENOUS
  Administered 2014-11-18: 40 ug/kg/min via INTRAVENOUS
  Administered 2014-11-19: 20 ug/kg/min via INTRAVENOUS
  Administered 2014-11-19: 30 ug/kg/min via INTRAVENOUS
  Administered 2014-11-19: 20 ug/kg/min via INTRAVENOUS
  Administered 2014-11-19: 40 ug/kg/min via INTRAVENOUS
  Administered 2014-11-20: 35 ug/kg/min via INTRAVENOUS
  Administered 2014-11-20: 20 ug/kg/min via INTRAVENOUS
  Administered 2014-11-21: 35 ug/kg/min via INTRAVENOUS
  Filled 2014-11-18 (×11): qty 100

## 2014-11-18 MED ORDER — SUCCINYLCHOLINE CHLORIDE 20 MG/ML IJ SOLN
INTRAMUSCULAR | Status: AC
  Filled 2014-11-18: qty 1

## 2014-11-18 MED ORDER — PANTOPRAZOLE SODIUM 40 MG IV SOLR
40.0000 mg | Freq: Every day | INTRAVENOUS | Status: DC
  Administered 2014-11-18 – 2014-11-21 (×4): 40 mg via INTRAVENOUS
  Filled 2014-11-18 (×4): qty 40

## 2014-11-18 MED ORDER — PNEUMOCOCCAL VAC POLYVALENT 25 MCG/0.5ML IJ INJ
0.5000 mL | INJECTION | INTRAMUSCULAR | Status: AC
  Administered 2014-11-19: 0.5 mL via INTRAMUSCULAR
  Filled 2014-11-18: qty 0.5

## 2014-11-18 MED ORDER — PANTOPRAZOLE SODIUM 40 MG IV SOLR: 40.0000 mg | Freq: Every day | INTRAVENOUS | Status: DC

## 2014-11-18 MED ORDER — HEPARIN BOLUS VIA INFUSION
2000.0000 [IU] | Freq: Once | INTRAVENOUS | Status: AC
  Administered 2014-11-18: 2000 [IU] via INTRAVENOUS
  Filled 2014-11-18: qty 2000

## 2014-11-18 MED ORDER — PHENYLEPHRINE 40 MCG/ML (10ML) SYRINGE FOR IV PUSH (FOR BLOOD PRESSURE SUPPORT)
PREFILLED_SYRINGE | INTRAVENOUS | Status: AC
  Filled 2014-11-18: qty 10

## 2014-11-18 MED ORDER — FENTANYL CITRATE (PF) 250 MCG/5ML IJ SOLN
INTRAMUSCULAR | Status: AC
  Filled 2014-11-18: qty 5

## 2014-11-18 MED ORDER — FENTANYL CITRATE (PF) 100 MCG/2ML IJ SOLN
50.0000 ug | INTRAMUSCULAR | Status: DC | PRN
  Administered 2014-11-19: 50 ug via INTRAVENOUS
  Filled 2014-11-18 (×10): qty 2

## 2014-11-18 MED ORDER — LIDOCAINE HCL (PF) 1 % IJ SOLN
INTRAMUSCULAR | Status: AC
  Filled 2014-11-18: qty 30

## 2014-11-18 MED ORDER — LIDOCAINE HCL (CARDIAC) 20 MG/ML IV SOLN
INTRAVENOUS | Status: AC
  Filled 2014-11-18: qty 5

## 2014-11-18 MED ORDER — IOHEXOL 300 MG/ML  SOLN
250.0000 mL | Freq: Once | INTRAMUSCULAR | Status: DC | PRN
  Administered 2014-11-18: 100 mL via INTRAVENOUS
  Filled 2014-11-18: qty 250

## 2014-11-18 MED ORDER — EPHEDRINE SULFATE 50 MG/ML IJ SOLN
INTRAMUSCULAR | Status: AC
  Filled 2014-11-18: qty 1

## 2014-11-18 MED ORDER — FENTANYL CITRATE (PF) 100 MCG/2ML IJ SOLN
INTRAMUSCULAR | Status: DC | PRN
  Administered 2014-11-18: 100 ug via INTRAVENOUS
  Administered 2014-11-18 (×2): 50 ug via INTRAVENOUS
  Administered 2014-11-18 (×3): 100 ug via INTRAVENOUS

## 2014-11-18 MED ORDER — ASPIRIN 325 MG PO TABS
325.0000 mg | ORAL_TABLET | Freq: Every day | ORAL | Status: DC
  Administered 2014-11-19 – 2014-11-24 (×6): 325 mg via ORAL
  Filled 2014-11-18 (×6): qty 1

## 2014-11-18 MED ORDER — PROPOFOL 500 MG/50ML IV EMUL
INTRAVENOUS | Status: DC | PRN
Start: 2014-11-18 — End: 2014-11-18
  Administered 2014-11-18: 50 ug/kg/min via INTRAVENOUS

## 2014-11-18 MED ORDER — THROMBIN 20000 UNITS EX SOLR
CUTANEOUS | Status: AC
  Filled 2014-11-18: qty 20000

## 2014-11-18 MED ORDER — HEPARIN (PORCINE) IN NACL 100-0.45 UNIT/ML-% IJ SOLN
650.0000 [IU]/h | INTRAMUSCULAR | Status: AC
  Administered 2014-11-18: 500 [IU]/h via INTRAVENOUS
  Filled 2014-11-18: qty 250

## 2014-11-18 MED ORDER — ONDANSETRON HCL 4 MG/2ML IJ SOLN: 4.0000 mg | Freq: Four times a day (QID) | INTRAMUSCULAR | Status: DC | PRN

## 2014-11-18 MED ORDER — MIDAZOLAM HCL 2 MG/2ML IJ SOLN
INTRAMUSCULAR | Status: AC
  Filled 2014-11-18: qty 4

## 2014-11-18 MED ORDER — SODIUM CHLORIDE 0.9 % IV SOLN: 250.0000 mL | INTRAVENOUS | Status: DC | PRN

## 2014-11-18 MED ORDER — ACETAMINOPHEN 650 MG RE SUPP: 650.0000 mg | Freq: Four times a day (QID) | RECTAL | Status: DC | PRN

## 2014-11-18 MED ORDER — DEXTROSE 5 % IV SOLN
1.5000 g | INTRAVENOUS | Status: DC
  Filled 2014-11-18: qty 1.5

## 2014-11-18 MED ORDER — FENTANYL CITRATE (PF) 100 MCG/2ML IJ SOLN
50.0000 ug | INTRAMUSCULAR | Status: DC | PRN
  Administered 2014-11-19 – 2014-11-20 (×6): 50 ug via INTRAVENOUS
  Administered 2014-11-21: 25 ug via INTRAVENOUS
  Administered 2014-11-21 (×2): 50 ug via INTRAVENOUS
  Administered 2014-11-21 – 2014-11-22 (×2): 25 ug via INTRAVENOUS
  Administered 2014-11-22 – 2014-11-24 (×12): 50 ug via INTRAVENOUS
  Filled 2014-11-18 (×12): qty 2

## 2014-11-18 MED ORDER — SODIUM CHLORIDE 0.9 % IV SOLN
INTRAVENOUS | Status: DC | PRN
  Administered 2014-11-18: 13:00:00

## 2014-11-18 MED ORDER — ONDANSETRON HCL 4 MG/2ML IJ SOLN
INTRAMUSCULAR | Status: AC
  Filled 2014-11-18: qty 2

## 2014-11-18 MED ORDER — PROMETHAZINE HCL 25 MG/ML IJ SOLN
6.2500 mg | INTRAMUSCULAR | Status: DC | PRN
Start: 2014-11-18 — End: 2014-11-18

## 2014-11-18 MED ORDER — IODIXANOL 320 MG/ML IV SOLN
INTRAVENOUS | Status: DC | PRN
  Administered 2014-11-18: 5 mL via INTRAVENOUS

## 2014-11-18 MED ORDER — SUCCINYLCHOLINE CHLORIDE 20 MG/ML IJ SOLN
INTRAMUSCULAR | Status: DC | PRN
  Administered 2014-11-18: 120 mg via INTRAVENOUS

## 2014-11-18 MED ORDER — NICARDIPINE HCL IN NACL 20-0.86 MG/200ML-% IV SOLN
5.0000 mg/h | INTRAVENOUS | Status: DC
  Filled 2014-11-18 (×2): qty 200

## 2014-11-18 MED ORDER — SODIUM CHLORIDE 0.9 % IV SOLN
INTRAVENOUS | Status: DC | PRN
  Administered 2014-11-18: 10:00:00

## 2014-11-18 MED ORDER — PROPOFOL 10 MG/ML IV BOLUS
INTRAVENOUS | Status: DC | PRN
  Administered 2014-11-18: 150 mg via INTRAVENOUS

## 2014-11-18 MED ORDER — PROPOFOL 10 MG/ML IV BOLUS
INTRAVENOUS | Status: AC
  Filled 2014-11-18: qty 20

## 2014-11-18 MED ORDER — SODIUM CHLORIDE 0.9 % IV SOLN
INTRAVENOUS | Status: DC
  Administered 2014-11-18: 75 mL via INTRAVENOUS
  Administered 2014-11-19 – 2014-11-21 (×2): via INTRAVENOUS

## 2014-11-18 MED ORDER — HEPARIN (PORCINE) IN NACL 100-0.45 UNIT/ML-% IJ SOLN: 500.0000 [IU]/h | INTRAMUSCULAR | Status: DC

## 2014-11-18 MED ORDER — GLYCOPYRROLATE 0.2 MG/ML IJ SOLN
INTRAMUSCULAR | Status: DC | PRN
  Administered 2014-11-18: 0.2 mg via INTRAVENOUS

## 2014-11-18 MED ORDER — ROCURONIUM BROMIDE 100 MG/10ML IV SOLN
INTRAVENOUS | Status: DC | PRN
  Administered 2014-11-18: 10 mg via INTRAVENOUS
  Administered 2014-11-18: 20 mg via INTRAVENOUS
  Administered 2014-11-18: 30 mg via INTRAVENOUS
  Administered 2014-11-18: 10 mg via INTRAVENOUS
  Administered 2014-11-18 (×2): 30 mg via INTRAVENOUS

## 2014-11-18 MED ORDER — PHENYLEPHRINE HCL 10 MG/ML IJ SOLN
10.0000 mg | INTRAMUSCULAR | Status: DC | PRN
  Administered 2014-11-18: 20 ug/min via INTRAVENOUS

## 2014-11-18 MED ORDER — CHLORHEXIDINE GLUCONATE 0.12% ORAL RINSE (MEDLINE KIT)
15.0000 mL | Freq: Two times a day (BID) | OROMUCOSAL | Status: DC
  Administered 2014-11-18 – 2014-11-21 (×6): 15 mL via OROMUCOSAL

## 2014-11-18 MED ORDER — DEXAMETHASONE SODIUM PHOSPHATE 10 MG/ML IJ SOLN
INTRAMUSCULAR | Status: DC | PRN
  Administered 2014-11-18: 4 mg via INTRAVENOUS

## 2014-11-18 MED ORDER — HEPARIN SODIUM (PORCINE) 1000 UNIT/ML IJ SOLN
INTRAMUSCULAR | Status: DC | PRN
  Administered 2014-11-18: 1000 [IU] via INTRAVENOUS
  Administered 2014-11-18: 500 [IU] via INTRAVENOUS
  Administered 2014-11-18: 2000 [IU] via INTRAVENOUS

## 2014-11-18 MED ORDER — HEPARIN (PORCINE) IN NACL 100-0.45 UNIT/ML-% IJ SOLN
500.0000 [IU]/h | INTRAMUSCULAR | Status: DC
  Administered 2014-11-18: 500 [IU]/h via INTRAVENOUS
  Filled 2014-11-18 (×2): qty 250

## 2014-11-18 MED ORDER — SODIUM CHLORIDE 0.9 % IV SOLN: INTRAVENOUS | Status: DC

## 2014-11-18 MED ORDER — LIDOCAINE HCL (CARDIAC) 20 MG/ML IV SOLN
INTRAVENOUS | Status: DC | PRN
  Administered 2014-11-18: 100 mg via INTRAVENOUS

## 2014-11-18 MED ORDER — CLOPIDOGREL BISULFATE 75 MG PO TABS
75.0000 mg | ORAL_TABLET | Freq: Every day | ORAL | Status: DC
  Administered 2014-11-19 – 2014-11-24 (×6): 75 mg via ORAL
  Filled 2014-11-18 (×6): qty 1

## 2014-11-18 MED ORDER — NITROGLYCERIN 1 MG/10 ML FOR IR/CATH LAB
INTRA_ARTERIAL | Status: AC
  Filled 2014-11-18: qty 10

## 2014-11-18 MED ORDER — INFLUENZA VAC SPLIT QUAD 0.5 ML IM SUSY
0.5000 mL | PREFILLED_SYRINGE | INTRAMUSCULAR | Status: DC
  Filled 2014-11-18: qty 0.5

## 2014-11-18 MED ORDER — ONDANSETRON HCL 4 MG/2ML IJ SOLN
INTRAMUSCULAR | Status: DC | PRN
  Administered 2014-11-18: 4 mg via INTRAVENOUS

## 2014-11-18 MED ORDER — LACTATED RINGERS IV SOLN
INTRAVENOUS | Status: DC | PRN
Start: 2014-11-18 — End: 2014-11-18
  Administered 2014-11-18 (×2): via INTRAVENOUS

## 2014-11-18 MED ORDER — ANTISEPTIC ORAL RINSE SOLUTION (CORINZ)
7.0000 mL | OROMUCOSAL | Status: DC
  Administered 2014-11-18 – 2014-11-22 (×26): 7 mL via OROMUCOSAL

## 2014-11-18 MED ORDER — ROCURONIUM BROMIDE 50 MG/5ML IV SOLN
INTRAVENOUS | Status: AC
  Filled 2014-11-18: qty 1

## 2014-11-18 SURGICAL SUPPLY — 40 items
CANISTER SUCTION 2500CC (MISCELLANEOUS) ×2 IMPLANT
CATH ROBINSON RED A/P 18FR (CATHETERS) ×2 IMPLANT
CATH SUCT 10FR WHISTLE TIP (CATHETERS) ×2 IMPLANT
CLIP TI MEDIUM 24 (CLIP) ×2 IMPLANT
CLIP TI WIDE RED SMALL 24 (CLIP) ×2 IMPLANT
CRADLE DONUT ADULT HEAD (MISCELLANEOUS) ×2 IMPLANT
DECANTER SPIKE VIAL GLASS SM (MISCELLANEOUS) IMPLANT
DRAIN HEMOVAC 1/8 X 5 (WOUND CARE) IMPLANT
DRSG COVADERM 4X8 (GAUZE/BANDAGES/DRESSINGS) ×1 IMPLANT
ELECT REM PT RETURN 9FT ADLT (ELECTROSURGICAL) ×2
ELECTRODE REM PT RTRN 9FT ADLT (ELECTROSURGICAL) ×1 IMPLANT
EVACUATOR SILICONE 100CC (DRAIN) IMPLANT
GAUZE SPONGE 4X4 12PLY STRL (GAUZE/BANDAGES/DRESSINGS) ×2 IMPLANT
GLOVE SS BIOGEL STRL SZ 7 (GLOVE) ×1 IMPLANT
GLOVE SUPERSENSE BIOGEL SZ 7 (GLOVE) ×1
GOWN STRL REUS W/ TWL LRG LVL3 (GOWN DISPOSABLE) ×3 IMPLANT
GOWN STRL REUS W/TWL LRG LVL3 (GOWN DISPOSABLE) ×6
INSERT FOGARTY SM (MISCELLANEOUS) ×2 IMPLANT
KIT BASIN OR (CUSTOM PROCEDURE TRAY) ×2 IMPLANT
KIT ROOM TURNOVER OR (KITS) ×2 IMPLANT
LIQUID BAND (GAUZE/BANDAGES/DRESSINGS) ×1 IMPLANT
NEEDLE 22X1 1/2 (OR ONLY) (NEEDLE) IMPLANT
NS IRRIG 1000ML POUR BTL (IV SOLUTION) ×4 IMPLANT
PACK CAROTID (CUSTOM PROCEDURE TRAY) ×2 IMPLANT
PAD ARMBOARD 7.5X6 YLW CONV (MISCELLANEOUS) ×4 IMPLANT
SHUNT CAROTID BYPASS 12FRX15.5 (VASCULAR PRODUCTS) IMPLANT
SPONGE INTESTINAL PEANUT (DISPOSABLE) ×2 IMPLANT
SUT PROLENE 5 0 C 1 24 (SUTURE) ×2 IMPLANT
SUT PROLENE 6 0 CC (SUTURE) ×2 IMPLANT
SUT SILK 2 0 FS (SUTURE) ×2 IMPLANT
SUT SILK 3 0 TIES 17X18 (SUTURE)
SUT SILK 3-0 18XBRD TIE BLK (SUTURE) IMPLANT
SUT VIC AB 2-0 CT1 27 (SUTURE) ×2
SUT VIC AB 2-0 CT1 TAPERPNT 27 (SUTURE) ×1 IMPLANT
SUT VIC AB 3-0 SH 27 (SUTURE) ×2
SUT VIC AB 3-0 SH 27X BRD (SUTURE) IMPLANT
SUT VIC AB 3-0 X1 27 (SUTURE) ×2 IMPLANT
SUT VICRYL 4-0 PS2 18IN ABS (SUTURE) ×1 IMPLANT
SYR CONTROL 10ML LL (SYRINGE) IMPLANT
WATER STERILE IRR 1000ML POUR (IV SOLUTION) ×2 IMPLANT

## 2014-11-18 SURGICAL SUPPLY — 56 items
CANISTER SUCTION 2500CC (MISCELLANEOUS) ×2 IMPLANT
CANNULA VESSEL 3MM 2 BLNT TIP (CANNULA) ×2 IMPLANT
CATH ROBINSON RED A/P 18FR (CATHETERS) ×2 IMPLANT
CLIP TI MEDIUM 6 (CLIP) ×2 IMPLANT
CLIP TI WIDE RED SMALL 6 (CLIP) ×2 IMPLANT
COVER DOME SNAP 22 D (MISCELLANEOUS) ×1 IMPLANT
CRADLE DONUT ADULT HEAD (MISCELLANEOUS) ×2 IMPLANT
DECANTER SPIKE VIAL GLASS SM (MISCELLANEOUS) IMPLANT
DRAIN HEMOVAC 1/8 X 5 (WOUND CARE) IMPLANT
DRAPE ORTHO SPLIT 77X108 STRL (DRAPES) ×2
DRAPE PROXIMA HALF (DRAPES) ×2 IMPLANT
DRAPE SURG ORHT 6 SPLT 77X108 (DRAPES) IMPLANT
ELECT REM PT RETURN 9FT ADLT (ELECTROSURGICAL) ×2
ELECTRODE REM PT RTRN 9FT ADLT (ELECTROSURGICAL) ×1 IMPLANT
EVACUATOR SILICONE 100CC (DRAIN) IMPLANT
GAUZE SPONGE 4X4 12PLY STRL (GAUZE/BANDAGES/DRESSINGS) ×3 IMPLANT
GEL ULTRASOUND 20GR AQUASONIC (MISCELLANEOUS) IMPLANT
GLOVE BIO SURGEON STRL SZ7.5 (GLOVE) ×2 IMPLANT
GLOVE BIOGEL PI IND STRL 6.5 (GLOVE) IMPLANT
GLOVE BIOGEL PI IND STRL 7.5 (GLOVE) IMPLANT
GLOVE BIOGEL PI INDICATOR 6.5 (GLOVE) ×4
GLOVE BIOGEL PI INDICATOR 7.5 (GLOVE) ×1
GLOVE SURG SS PI 7.5 STRL IVOR (GLOVE) ×2 IMPLANT
GOWN STRL REUS W/ TWL LRG LVL3 (GOWN DISPOSABLE) ×3 IMPLANT
GOWN STRL REUS W/TWL LRG LVL3 (GOWN DISPOSABLE) ×6
KIT BASIN OR (CUSTOM PROCEDURE TRAY) ×2 IMPLANT
KIT ROOM TURNOVER OR (KITS) ×2 IMPLANT
LIQUID BAND (GAUZE/BANDAGES/DRESSINGS) ×2 IMPLANT
LOOP VESSEL MINI RED (MISCELLANEOUS) IMPLANT
NDL HYPO 25GX1X1/2 BEV (NEEDLE) IMPLANT
NDL PERC 18GX7CM (NEEDLE) IMPLANT
NEEDLE HYPO 25GX1X1/2 BEV (NEEDLE) IMPLANT
NEEDLE PERC 18GX7CM (NEEDLE) ×2 IMPLANT
NS IRRIG 1000ML POUR BTL (IV SOLUTION) ×4 IMPLANT
PACK CAROTID (CUSTOM PROCEDURE TRAY) ×2 IMPLANT
PAD ARMBOARD 7.5X6 YLW CONV (MISCELLANEOUS) ×4 IMPLANT
SHEATH PINNACLE 6F 10CM (SHEATH) ×1 IMPLANT
SHEATH PINNACLE R/O II 6F 4CM (SHEATH) ×1 IMPLANT
SHUNT CAROTID BYPASS 10 (VASCULAR PRODUCTS) IMPLANT
SHUNT CAROTID BYPASS 12FRX15.5 (VASCULAR PRODUCTS) IMPLANT
SPONGE GAUZE 4X4 12PLY STER LF (GAUZE/BANDAGES/DRESSINGS) ×1 IMPLANT
SPONGE INTESTINAL PEANUT (DISPOSABLE) ×2 IMPLANT
SPONGE SURGIFOAM ABS GEL 100 (HEMOSTASIS) IMPLANT
SUT ETHILON 3 0 PS 1 (SUTURE) IMPLANT
SUT PROLENE 6 0 CC (SUTURE) ×2 IMPLANT
SUT PROLENE 7 0 BV 1 (SUTURE) IMPLANT
SUT SILK  1 MH (SUTURE) ×1
SUT SILK 1 MH (SUTURE) IMPLANT
SUT SILK 3 0 TIES 17X18 (SUTURE)
SUT SILK 3-0 18XBRD TIE BLK (SUTURE) IMPLANT
SUT VIC AB 3-0 SH 27 (SUTURE) ×2
SUT VIC AB 3-0 SH 27X BRD (SUTURE) ×1 IMPLANT
SUT VICRYL 4-0 PS2 18IN ABS (SUTURE) ×2 IMPLANT
SYR CONTROL 10ML LL (SYRINGE) IMPLANT
TAPE CLOTH SURG 6X10 WHT LF (GAUZE/BANDAGES/DRESSINGS) ×1 IMPLANT
WATER STERILE IRR 1000ML POUR (IV SOLUTION) ×2 IMPLANT

## 2014-11-18 NOTE — Procedures (Signed)
S/p Lt common carotid arteriogram. Direct lt common carotid access Placement of x 2 pipeline flow diverters across large irregular LT ICA /LT ACA A 1  junction

## 2014-11-18 NOTE — Progress Notes (Signed)
ANTICOAGULATION CONSULT NOTE - Initial Consult  Pharmacy Consult for Heparin Indication: s/p IR intervention (coil embolization of intracranial aneurysm)  Allergies  Allergen Reactions  . Dilaudid [Hydromorphone] Swelling  . Codeine Nausea And Vomiting  . Hydromorphone Hcl Swelling  . Latex Swelling    gloves used at dental office caused lips to swell. Used different ones no problem. Never had any problem at hospital or anywhere else.  [facility] Cross Reactors Other (See Comments)    Unknown childhood reaction    Patient Measurements: Height: 5\' 1"  (154.9 cm) Weight: 152 lb 1.9 oz (69 kg) IBW/kg (Calculated) : 47.8 Heparin Dosing Weight: 62.5  Vital Signs: Temp: 95.2 F (35.1 C) (10/26 1403) Temp Source: Rectal (10/26 1403) BP: 137/69 mmHg (10/26 1558) Pulse Rate: 51 (10/26 1558)  Labs:  Recent Labs  11/18/14 1416  HGB 10.7*  HCT 31.6*  PLT 103*  CREATININE 0.63    Estimated Creatinine Clearance: 58.2 mL/min (by C-G formula based on Cr of 0.63).   Medical History: Past Medical History  Diagnosis Date  . Ventricular hypertrophy 04/2009    LVH with diastolic dysfunction by echo. Has normal EF.  Marland Kitchen Pancreatitis     x2  . PAC (premature atrial contraction)   . TIA (transient ischemic attack)     She was hospitalized 04-23-09 through 04-27-09 for involving right side of the body  . Hypertension   . Tobacco abuse     Ongoing   . Edema of foot     She has a history of chronic edema of the left dated back to age 25 when she suufered severe frostbite playing  on the snow as a child  . Coronary artery disease 1996    Known with prior mild lesion of LAD demonstrated by Cardiac Catheterization in 1996  . COPD (chronic obstructive pulmonary disease) ([place])   . Shortness of breath dyspnea     since stroke 2 months ago -  . 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  Marland Kitchen PONV (postoperative  nausea and vomiting)     problems waking up last time 4/16 only time  . 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 [START ON 11/19/2014] aspirin  325 mg Oral Q breakfast  . [START ON 11/19/2014] clopidogrel  75 mg Oral Q breakfast  . pantoprazole (PROTONIX) IV  40 mg Intravenous QHS   Infusions:  . sodium chloride    . sodium chloride    . heparin    . niCARDipine    . propofol (DIPRIVAN) infusion      Assessment: Patient s/p carotid access for coil embolization of intracranial aneurysm.  Pharmacy consulted to dose heparin for post-interventional procedure.    Goal of Therapy:  Heparin level goal 0.1-0.25 Monitor platelets by anticoagulation protocol: Yes   Plan:  - No bolus, begin heparin 500 units/hr - Heparin level in 8 hours  - Heparin level goal 0.1-0.25 - Continue to monitor CBC, heparin levels, s/sx bleeding   Reatha Harps, Pharm.D., BCPS Clinical Pharmacist 11/18/2014 4:35 PM

## 2014-11-18 NOTE — Progress Notes (Signed)
Patient ID: Jeanne Lawson, female   DOB: 13-Dec-1945, 69 y.o.   MRN: [id number] Post procedure. Intubated and on sedation. Per nurse patient intermittently appropriately responsive.Moves all 4s ,rt more than left.Patient weak in left arm and left leg from previous stroke. Pupils approx 2.5 mm sluggish. BP130s to 140s/60s to 70s.PR 60s to 80s SR PaO2 >95 % on vent  RT groin soft. Plan to continue on IV heparin ,Maintain BP  By cuff between 122mmhg to 130 mmhg. D/W family. Dr [name] MD

## 2014-11-18 NOTE — Transfer of Care (Signed)
Immediate Anesthesia Transfer of Care Note  Patient: Jeanne Lawson  Procedure(s) Performed: Procedure(s): CAROTID EXPOSURE (Left)  Patient Location:Neuro ICU   Anesthesia Type:General  Level of Consciousness: sedated, unresponsive and Patient remains intubated per anesthesia plan  Airway & Oxygen Therapy: Patient remains intubated per anesthesia plan and Patient placed on Ventilator (see vital sign flow sheet for setting)  Post-op Assessment: Report given to RN and Post -op Vital signs reviewed and stable  Post vital signs: stable  Last Vitals: There were no vitals filed for this visit.  Complications: No apparent anesthesia complications

## 2014-11-18 NOTE — Interval H&P Note (Signed)
History and Physical Interval Note:  11/18/2014 8:17 AM  Jeanne Lawson  has presented today for surgery, with the diagnosis of Intracranial aneurysm I72.0  The various methods of treatment have been discussed with the patient and family. After consideration of risks, benefits and other options for treatment, the patient has consented to  Procedure(s): CAROTID EXPOSURE (Left) as a surgical intervention .  The patient's history has been reviewed, patient examined, no change in status, stable for surgery.  I have reviewed the patient's chart and labs.  Questions were answered to the patient's satisfaction.     Ruta Hinds

## 2014-11-18 NOTE — OR Nursing (Signed)
Patient out of OR 16 to INR @ 10:13

## 2014-11-18 NOTE — Progress Notes (Signed)
ANTICOAGULATION CONSULT NOTE - Follow Up Consult  Pharmacy Consult for Heparin Indication: s/p IR intervention (coil embolization of intracranial aneurysm)  Allergies  Allergen Reactions  . Dilaudid [Hydromorphone] Swelling  . Codeine Nausea And Vomiting  . Hydromorphone Hcl Swelling  . Latex Swelling    gloves used at dental office caused lips to swell. Used different ones no problem. Never had any problem at hospital or anywhere else.  [facility] Cross Reactors Other (See Comments)    Unknown childhood reaction    Patient Measurements: Height: 5\' 1"  (154.9 cm) Weight: 152 lb 1.9 oz (69 kg) IBW/kg (Calculated) : 47.8 Heparin Dosing Weight: 62.5  Vital Signs: Temp: 97.5 F (36.4 C) (10/26 1600) Temp Source: Axillary (10/26 1600) BP: 110/52 mmHg (10/26 1800) Pulse Rate: 55 (10/26 1800)  Labs:  Recent Labs  11/18/14 1416 11/18/14 1823  HGB 10.7*  --   HCT 31.6*  --   PLT 103*  --   APTT  --  86*  CREATININE 0.63  --     Estimated Creatinine Clearance: 58.2 mL/min (by C-G formula based on Cr of 0.63).   Medications:  Scheduled:  . [START ON 11/19/2014] aspirin  325 mg Oral Q breakfast  . [START ON 11/19/2014] clopidogrel  75 mg Oral Q breakfast  . [START ON 11/19/2014] Influenza vac split quadrivalent PF  0.5 mL Intramuscular Tomorrow-1000  . pantoprazole (PROTONIX) IV  40 mg Intravenous QHS  . [START ON 11/19/2014] pneumococcal 23 valent vaccine  0.5 mL Intramuscular Tomorrow-1000   Infusions:  . sodium chloride    . sodium chloride 75 mL (11/18/14 1639)  . heparin    . niCARDipine    . propofol (DIPRIVAN) infusion 50 mcg/kg/min (11/18/14 1637)    Assessment: Patient s/p carotid access for coil embolization of intracranial aneurysm. Pharmacy consulted to dose heparin for post-interventional procedure.  Patient received 2000 units bolus and drip was started at 500 units/hr.   PTT reported 86, per Dr. Dora Sims hold heparin drip for 2 hours then  resume at previous rate.   Heparin level goal 0.1-0.25 Monitor platelets by anticoagulation protocol: Yes   Plan:  - Hold heparin for 2 hours - Resume heparin at 500 units/hr - Heparin goal 0.1-0.25 - Monitor CBC, heparin levels, s/sx bleeding  Reatha Harps, Pharm.D., BCPS Clinical Pharmacist [phone] Pager 11/18/2014 7:01 PM

## 2014-11-18 NOTE — Op Note (Signed)
Procedure: Exposure and repair of left common carotid artery  Preoperative diagnosis: Intracranial aneurysm postoperative diagnosis: Same  Anesthesia: Gen.  Assistant: Leontine Locket PA-C  Indications: Patient is a 69 year old female who needs common carotid access for coil embolization of an intracranial aneurysm.  Operative findings: Left common carotid 6 French short sheath, primary repair on removal of sheath  Operative details: After obtaining informed consent, the patient was taken to the operating room. The patient was placed in supine position operating room table. After induction of general anesthesia and endotracheal intubation, a Foley catheter was placed. Next the patient's entire left neck and chest were prepped and draped in usual sterile fashion. An oblique incision was made at the base of the left neck just anterior to the border of the left sternocleidomastoid muscle. The incision was carried down through the subcutaneous tissues and through the platysma. The sternocleidomastoid muscle was identified and reflected laterally. Common carotid artery was identified at the base of the wound. Common carotid artery was dissected free circumferentially and a vessel loop placed around this. The vagus nerve was identified and protected. Next an introducer needle was used to cannulate the left common carotid artery and 8035 J-wire advanced into the common carotid artery and a 6 French sheath placed over this. The dilator and guidewire were removed. The sheath was thoroughly flushed with heparinized saline. It was then sutured to the skin with a 0 silk suture. The incision was loosely approximated with staples. The patient was then transported on a ventilator to interventional radiology for her intracranial aneurysm coil localization.  The patient returned from interventional radiology. The patient's entire neck and chest were again prepped and draped in usual sterile fashion. Staples were  removed. The wound was thoroughly irrigated with normal saline solution. The sheath was removed and the hole in the artery repaired with 2 figure-of-eight 5-0 Prolene sutures. Hemostasis was obtained. The wound was again thoroughly irrigated with normal saline solution. The platysma was then reapproximated with a running 3-0 Vicryl suture. The skin was closed with a 4-0 Vicryl subcuticular stitch. Local band was applied. Patient tolerated the procedure well and there were no complications. The instrument sponge and needle count was correct at the end of the case. The patient was transported to the neuro ICU in stable condition.  Ruta Hinds, MD [facility] [place] Office: [phone] Pager: [phone]

## 2014-11-18 NOTE — Progress Notes (Signed)
   Subjective: S/p Lt common carotid arteriogram VIA direct lt common carotid access by Vascular  Placement of x 2 pipeline flow diverters across large irregular LT ICA /LT ACA A1 junction Intubated/sedated   Allergies: Dilaudid; Codeine; Hydromorphone hcl; Latex; and Sulfa drugs cross reactors  Medications: Prior to Admission medications   Medication Sig Start Date End Date Taking? Authorizing Provider  albuterol (PROVENTIL HFA;VENTOLIN HFA) 108 (90 BASE) MCG/ACT inhaler Inhale 1-2 puffs into the lungs every 6 (six) hours as needed for wheezing or shortness of breath.   Yes Historical Provider, MD  aspirin 81 MG tablet Take 81 mg by mouth daily.   Yes Historical Provider, MD  atorvastatin (LIPITOR) 40 MG tablet Take 1 tablet (40 mg total) by mouth daily at 6 PM. 05/27/14  Yes Darlin Coco, MD  clopidogrel (PLAVIX) 75 MG tablet TAKE 1 TABLET BY MOUTH DAILY. 05/27/14  Yes Darlin Coco, MD  lisinopril-hydrochlorothiazide (PRINZIDE,ZESTORETIC) 20-12.5 MG tablet TAKE 1 TABLET BY MOUTH DAILY. 10/23/14  Yes Darlin Coco, MD  TOPROL XL 50 MG 24 hr tablet TAKE 1/2 TABLET BY MOUTH TWICE DAILY. 07/01/14  Yes Darlin Coco, MD    Vital Signs: Temp(Src) 95.2 F (35.1 C) (Rectal) cuff BP: 130/66 mmHg, Intubated, HR: 56 bpm  Physical Exam General: Intubated, sedated Left CCA access intact, soft, no signs of hematoma Heart: RRR Lungs: CTA  Imaging: No results found.  Labs:  CBC:  Recent Labs  08/12/14 1655 08/13/14 0503 08/14/14 0237 11/13/14 1006  WBC 4.6 6.6 4.8 5.2  HGB 11.2* 10.2* 9.1* 12.6  HCT 33.1* 30.3* 27.9* 36.7  PLT 106* 110* 102* 124*    COAGS:  Recent Labs  05/12/14 1450 08/07/14 1048 08/12/14 1615 08/12/14 2050 11/13/14 1006  INR 1.02 1.01 1.21  --  1.09  APTT  --  29 >200* 34 26    BMP:  Recent Labs  08/07/14 1048 08/13/14 0503 08/14/14 0237 11/13/14 1006  NA 138 141 144 139  K 4.2 3.8 3.4* 4.0  CL 104 113* 112* 106  CO2 26 24 25 26     GLUCOSE 102* 127* 97 94  BUN 15 13 12 13   CALCIUM 8.9 7.2* 7.3* 8.6*  CREATININE 0.77 0.69 0.69 0.80  GFRNONAA >60 >60 >60 >60  GFRAA >60 >60 >60 >60    LIVER FUNCTION TESTS:  Recent Labs  05/03/14 1910 08/07/14 1048 11/13/14 1006  BILITOT 1.1 0.6 0.7  AST 29 20 19   ALT 20 17 11*  ALKPHOS 42 40 41  PROT 6.9 6.6 6.6  ALBUMIN 3.6 3.5 3.4*    Assessment and Plan: Left supraclinoid intracranial aneurysm  S/p Lt common carotid arteriogram VIA direct lt common carotid access by Vascular- soft, no signs of hematoma  Placement of x 2 pipeline flow diverters across large irregular LT ICA /LT ACA A1 junction Intubated, cuff BP to be 810-175 mmHg Systolic per Dr. [name]  Will evaluate p2y12 in am and US neck to evaluate for hematoma- per Dr. [name]  HTN COPD S/p recent pipeline flow diverter to right internal carotid intracranial aneurysm    Signed: Hedy Jacob 11/18/2014, 2:41 PM

## 2014-11-18 NOTE — Anesthesia Preprocedure Evaluation (Addendum)
Anesthesia Evaluation  Patient identified by MRN, date of birth, ID band Patient awake    Reviewed: Allergy & Precautions, NPO status , Patient's Chart, lab work & pertinent test results  Airway Mallampati: II  TM Distance: >3 FB Neck ROM: Full    Dental  (+) Teeth Intact, Dental Advisory Given   Pulmonary shortness of breath, COPD, former smoker,    breath sounds clear to auscultation       Cardiovascular hypertension, + CAD and + Peripheral Vascular Disease   Rhythm:Regular Rate:Normal     Neuro/Psych TIACVA    GI/Hepatic   Endo/Other    Renal/GU      Musculoskeletal   Abdominal   Peds  Hematology   Anesthesia Other Findings   Reproductive/Obstetrics                            Anesthesia Physical Anesthesia Plan  ASA: IV  Anesthesia Plan: General   Post-op Pain Management:    Induction: Intravenous  Airway Management Planned: Oral ETT  Additional Equipment: Arterial line  Intra-op Plan:   Post-operative Plan: Possible Post-op intubation/ventilation  Informed Consent: I have reviewed the patients History and Physical, chart, labs and discussed the procedure including the risks, benefits and alternatives for the proposed anesthesia with the patient or authorized representative who has indicated his/her understanding and acceptance.   Dental advisory given  Plan Discussed with: CRNA and Surgeon  Anesthesia Plan Comments:         Anesthesia Quick Evaluation

## 2014-11-18 NOTE — Anesthesia Postprocedure Evaluation (Signed)
  Anesthesia Post-op Note  Patient: Jeanne Lawson  Procedure(s) Performed: Procedure(s): CAROTID EXPOSURE (Left)  Patient Location: NICU  Anesthesia Type:General  Level of Consciousness: sedated, unresponsive and Patient remains intubated per anesthesia plan  Airway and Oxygen Therapy: Patient remains intubated per anesthesia plan and Patient placed on Ventilator (see vital sign flow sheet for setting)  Post-op Pain: none  Post-op Assessment: Post-op Vital signs reviewed and Patient's Cardiovascular Status Stable              Post-op Vital Signs: stable  Last Vitals:  Filed Vitals:   11/18/14 1558  BP: 137/69  Pulse: 51  Temp:   Resp: 18    Complications: No apparent anesthesia complications

## 2014-11-18 NOTE — Anesthesia Procedure Notes (Signed)
Procedure Name: Intubation Date/Time: 11/18/2014 8:51 AM Performed by: Rebekah Chesterfield L Pre-anesthesia Checklist: Patient identified, Emergency Drugs available, Suction available, Patient being monitored and Timeout performed Patient Re-evaluated:Patient Re-evaluated prior to inductionOxygen Delivery Method: Circle system utilized Preoxygenation: Pre-oxygenation with 100% oxygen Intubation Type: IV induction Ventilation: Mask ventilation without difficulty Laryngoscope Size: Mac and 3 Grade View: Grade II Tube type: Oral Tube size: 7.0 mm Number of attempts: 1 Airway Equipment and Method: Stylet Placement Confirmation: ETT inserted through vocal cords under direct vision,  positive ETCO2 and breath sounds checked- equal and bilateral Secured at: 22 cm Tube secured with: Tape Dental Injury: Teeth and Oropharynx as per pre-operative assessment

## 2014-11-18 NOTE — H&P (View-Only) (Signed)
[facility] [place] HISTORY AND PHYSICAL    History of Present Illness:  Patient is a 69 year old female who presents for evaluation of her carotid arteries in consideration of open exposure of the left common carotid artery for access for coiling of a left intracranial aneurysm. She has known intracranial aneurysms. Dr. [name] is currently evaluating her for an endovascular intervention. However due to tortuosity and shape of her aortic arch a femoral approach is not possible. He has asked me to see the patient for possibility of exposing the left common carotid artery for exposure for his intervention. Patient previously underwent a similar procedure a couple of months ago. She had some hoarseness post procedure. She has some complaints over irritation from her previous right neck incision but overall this is well-healed. She was recently evaluated by Dr.Wolicki and found to have normal vocal cord function. Patient has had a prior stroke thought to be related to her aneurysms. She has some residual left arm weakness. Other residual effect is a sensation of a sandpaper type feel to her left hand. 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 She has had no other neck operations. She is currently on Plavix and aspirin. Other medical problems include  hypertension with left ventricular hypertrophy. These are both currently stable.    Past Medical History   Diagnosis  Date   .  Ventricular hypertrophy  04/2009       LVH with diastolic dysfunction by echo. Has normal EF.   Marland Kitchen  Pancreatitis         x2   .  PAC (premature atrial contraction)     .  TIA (transient ischemic attack)         She was hospitalized 04-23-09 through 04-27-09 for involving right side of the body   .  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  Hypertension      .  Tobacco abuse         Ongoing    .  Edema of foot         She has a history of chronic edema of the left dated back to age 44 when she suufered severe frostbite playing  on the snow as a child   .  Coronary artery disease  1996       Known with prior mild lesion of LAD demonstrated by Cardiac Catheterization in 1996   .  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   Marland Kitchen  COPD (chronic obstructive pulmonary disease)         Past Surgical History   Procedure  Laterality  Date   .  Vesicovaginal fistula closure w/ tah    25 yrs ago   .  Neck surgery    50 yrs ago       Left side   .  US echocardiography    04-26-2009       EF 65-70%   .  Cardiovascular stress test    12-02-2001       EF 70%   .  Cardiac catheterization    1996  Mild CAD with vasospasm   .  Abdominal hysterectomy       .  Laparoscopy       .  Radiology with anesthesia  N/A  05/25/2014       Procedure: RADIOLOGY WITH ANESTHESIA;  Surgeon: [name], MD;  Location: [facility];  Service: Radiology;  Laterality: N/A;     Social History History   Substance Use Topics   .  Smoking status:  Former Smoker -- 1.00 packs/day for 30 years       Types:  Cigarettes       Quit date:  04/26/2014   .  Smokeless tobacco:  Never Used   .  Alcohol Use:  No     Family History Family History   Problem  Relation  Age of Onset   .  Emphysema  Father     .  Aneurysm  Sister     .  Stroke  Mother     .  Hypertension  Mother     .  Hypertension  Daughter     .  Thyroid disease  Daughter       Allergies    Allergies   Allergen  Reactions   .  Dilaudid [Hydromorphone]  Swelling   .  Codeine  Nausea And Vomiting   .  Hydromorphone Hcl  Swelling   .  Sulfa Drugs Cross Reactors  Other (See Comments)       Unknown childhood reaction        Current Outpatient Prescriptions   Medication  Sig  Dispense  Refill   .  albuterol (PROVENTIL  HFA;VENTOLIN HFA) 108 (90 BASE) MCG/ACT inhaler  Inhale 1-2 puffs into the lungs every 6 (six) hours as needed for wheezing or shortness of breath.       Marland Kitchen  aspirin EC 81 MG tablet  Take 81 mg by mouth daily.       Marland Kitchen  atorvastatin (LIPITOR) 40 MG tablet  Take 1 tablet (40 mg total) by mouth daily at 6 PM.  30 tablet  3   .  clopidogrel (PLAVIX) 75 MG tablet  TAKE 1 TABLET BY MOUTH DAILY.  30 tablet  5   .  lisinopril-hydrochlorothiazide (PRINZIDE,ZESTORETIC) 20-12.5 MG per tablet  Take 1 tablet by mouth daily.  30 tablet  3   .  TOPROL XL 50 MG 24 hr tablet  TAKE 1/2 TABLET BY MOUTH TWICE DAILY.  30 tablet  3   .  mometasone-formoterol (DULERA) 100-5 MCG/ACT AERO  Inhale 2 puffs into the lungs 2 (two) times daily. (Patient not taking: Reported on 05/15/2014)  1 Inhaler  0      No current facility-administered medications for this visit.     ROS:    General:  No weight loss, Fever, chills  HEENT: No recent headaches, no nasal bleeding, no visual changes, no sore throat  Neurologic: No dizziness, blackouts, seizures. + recent symptoms of stroke or mini- stroke. No recent episodes of slurred speech, or temporary blindness.  Cardiac: No recent episodes of chest pain/pressure, no shortness of breath at rest.  No shortness of breath with exertion.  Denies history of atrial fibrillation or irregular heartbeat  Vascular: No history of rest pain in feet.  No history of claudication.  No history of non-healing ulcer, No history of DVT    Pulmonary: No home oxygen, no productive cough, no hemoptysis,  No asthma or wheezing  Musculoskeletal:  [ ]  Arthritis, [ ]  Low back  pain,  [ ]  Joint pain  Hematologic:No history of hypercoagulable state.  No history of easy bleeding.  No history of anemia  Gastrointestinal: No hematochezia or melena,  No gastroesophageal reflux, no trouble swallowing  Urinary: [ ]  chronic Kidney disease, [ ]  on HD - [ ]  MWF or [ ]  TTHS, [ ]  Burning with urination, [ ]  Frequent  urination, [ ]  Difficulty urinating;    Skin: No rashes  Psychological: No history of anxiety,  No history of depression   Physical Examination    Filed Vitals:   11/11/14 1055 11/11/14 1103  BP: 152/90 109/69  Pulse: 56 62  Temp: 97.7 F (36.5 C)   TempSrc: Oral   Resp: 16   Height: 5\' 1"  (1.549 m)   Weight: 154 lb (69.854 kg)   SpO2: 97%     General:  Alert and oriented, no acute distress HEENT: Normal Neck: No bruit or JVD, well-healed neck incision base of right neck Pulmonary: Clear to auscultation bilaterally Cardiac: Regular Rate and Rhythm without murmur Abdomen: Soft, non-tender, non-distended, no mass Skin: No rash Extremity Pulses:  2+ radial, brachial, femoral, dorsalis pedis, posterior tibial pulses bilaterally Musculoskeletal: No deformity or edema     Neurologic: Upper and lower extremity motor 5/5 and symmetric, subtle left hand intrinsic weakness  DATA:  Carotid duplex scan was performed previously which showed less than 50% stenosis bilaterally. I reviewed and interpreted this study.   ASSESSMENT:  I discussed with the patient today that I would be involved in her procedure with exposing the left common carotid artery. Risks benefits complications included but not limited to bleeding infection cranial nerve injury remote risk of stroke but I did emphasize to her that there would be risk of stroke with Dr. Benny Lennert procedure.   PLAN:  left common carotid exposure for interventional radiology procedure on 11/18/2014. The patient's exposure of her common carotid artery and sheath placement will be performed in operating room 16. The patient will then be transported to interventional radiology for her embolization procedure. She will then return back to operating room 16 for closure of the incision at the conclusion of Dr. Arlean Hopping procedure.  Ruta Hinds, MD [facility] Office: [phone] Pager: [phone]

## 2014-11-18 NOTE — H&P (Signed)
Chief Complaint: "I'm here for my aneurysm treatment"  HPI: Jeanne Lawson is an 69 y.o. female with known left-sided intracranial, supraclinoid intracranial aneurysm. 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 She was seen by Dr. [name] and is now scheduled for treatment of the left. This has been coordinated with Dr. Oneida Alar, who will provide open/direct access to left CCA, followed by endovascular treatment of the aneurysm. Procedure to be performed under general anesthesia. Pt feeling ok this am, no new changes since consult with Dr. [name]. She has been NPO this am  Past Medical History:  Past Medical History  Diagnosis Date  . Ventricular hypertrophy 04/2009    LVH with diastolic dysfunction by echo. Has normal EF.  Marland Kitchen Pancreatitis     x2  . PAC (premature atrial contraction)   . TIA (transient ischemic attack)     She was hospitalized 04-23-09 through 04-27-09 for involving right side of the body  . Hypertension   . Tobacco abuse     Ongoing   . Edema of foot     She has a history of chronic edema of the left dated back to age 15 when she suufered severe frostbite playing  on the snow as a child  . Coronary artery disease 1996    Known with prior mild lesion of LAD demonstrated by Cardiac Catheterization in 1996  . COPD (chronic obstructive pulmonary disease) ([place])   . Shortness of breath dyspnea     since stroke 2 months ago -  . 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  Marland Kitchen PONV (postoperative nausea and vomiting)     problems waking up last time 4/16 only time  . 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 Neck surgery  50 yrs ago    Left side tumor  . US echocardiography  04-26-2009    EF 65-70%  . Cardiovascular stress test  12-02-2001    EF 70%  . Cardiac catheterization  1996    Mild CAD with vasospasm  . Abdominal hysterectomy    . Laparoscopy    . Radiology with anesthesia N/A 05/25/2014    Procedure: RADIOLOGY WITH ANESTHESIA;  Surgeon: [name], MD;  Location: [facility];  Service: Radiology;  Laterality: N/A;  . Appendectomy    . Radiology with anesthesia N/A 08/12/2014    Procedure: RADIOLOGY WITH ANESTHESIA;  Surgeon: [name], MD;  Location: [facility];  Service: Radiology;  Laterality: N/A;  . Endarterectomy Right 08/12/2014    Procedure: RIGHT  COMMON CAROTID ARTERY EXPOSURE FOR INTERVENTIONAL RADIOLOGY PROCEDURE BY DR.DEVASHWAR,Insertion 6 FR sheath;  Surgeon: Elam Dutch, MD;  Location: [facility] OR;  Service: Vascular;  Laterality: Right;  . Endarterectomy Right 08/12/2014    Procedure:  CAROTID  EXPOSURE CLOSURE RIGHT NECK, REPAIR RIGHT COMMON CAROTID ARTERY;  Surgeon: Elam Dutch, MD;  Location: [facility] OR;  Service: Vascular;  Laterality: Right;    Family History:  Family History  Problem Relation Age of Onset  . Emphysema Father   .  Aneurysm Sister   . Stroke Mother   . Hypertension Mother   . Hypertension Daughter   . Thyroid disease Daughter     Social History:  reports that she quit smoking about 6 months ago. Her smoking use included Cigarettes. She has a 30 pack-year smoking history. She has never used smokeless tobacco. She reports that she does not drink alcohol or use illicit drugs.  Allergies:  Allergies  Allergen Reactions  . Dilaudid [Hydromorphone] Swelling  . Codeine Nausea And Vomiting  . Hydromorphone Hcl Swelling  . Latex Swelling    gloves used at dental office caused lips to swell. Used different ones no problem. Never had any problem at hospital or anywhere else.  .  Sulfa Drugs Cross Reactors Other (See Comments)    Unknown childhood reaction    Medications: Plavix 75mg  daily Aspirin 81 mg per day.  Lisinopril/hydrochlorothiazide 20/12.5mg  daily  for high blood pressure.  Atorvastatin 40mg  daily   Toprol XL 50mg  1/2 tablet bid  Proventil inhaler 2 puffs twicea day as needed.  Please HPI for pertinent positives, otherwise complete 10 system ROS negative.  Physical Exam: BP 143/68 mmHg  Pulse 57  Temp(Src) 97.6 F (36.4 C)  Resp 20  Ht 5\' 1"  (1.549 m)  Wt 152 lb (68.947 kg)  BMI 28.74 kg/m2  SpO2 98% Body mass index is 28.74 kg/(m^2).   General Appearance:  Alert, cooperative, no distress, appears stated age  Head:  Normocephalic, without obvious abnormality, atraumatic  ENT: Unremarkable  Neck: Supple, symmetrical, trachea midline. Carotids without bruit  Lungs:   Clear to auscultation bilaterally, no w/r/r, respirations unlabored without use of accessory muscles.  Heart:  Regular rate and rhythm, S1, S2 normal, no murmur, rub or gallop.  Abdomen:   Soft, non-tender, non distended.  Extremities: Extremities normal, atraumatic, no cyanosis or edema  Pulses: 2+ and symmetric femoral  Neurologic: Normal affect, no gross deficits.  Labs: CBC    Component Value Date/Time   WBC 5.2 11/13/2014 1006   RBC 4.40 11/13/2014 1006   HGB 12.6 11/13/2014 1006   HCT 36.7 11/13/2014 1006   PLT 124* 11/13/2014 1006   MCV 83.4 11/13/2014 1006   MCH 28.6 11/13/2014 1006   MCHC 34.3 11/13/2014 1006   RDW 14.1 11/13/2014 1006   BMET    Component Value Date/Time   NA 139 11/13/2014 1006   K 4.0 11/13/2014 1006   CL 106 11/13/2014 1006   CO2 26 11/13/2014 1006   GLUCOSE 94 11/13/2014 1006   BUN 13 11/13/2014 1006   CREATININE 0.80 11/13/2014 1006   CALCIUM 8.6* 11/13/2014 1006   GFRNONAA >60 11/13/2014 1006   GFRAA >60 11/13/2014 1006    INR/Prothrombin Time 14.3/1.09  P2Y12 120   Assessment/Plan Left-sided intracranial,  supraclinoid intracranial aneurysm. Plan for direct access by Dr. Oneida Alar, followed by endovascular treatment of aneurysm Labs reviewed, ok Again the risks of the procedure were described as per previously which included risk of thromboembolic stroke, aneurysm leaking, death, worsening neurological deficit. Will plan for overnight admission of least one night for observation. Consent signed in chart  Ascencion Dike PA-C 11/18/2014, 8:15 AM

## 2014-11-18 NOTE — Consult Note (Signed)
PULMONARY / CRITICAL CARE MEDICINE   Name: SHADE RIVENBARK MRN: [id number] DOB: 02/23/1945    ADMISSION DATE:  11/18/2014 CONSULTATION DATE:  10/26  REFERRING MD :  Oneida Alar   CHIEF COMPLAINT:  Vent management   INITIAL PRESENTATION: 69 yo female smoker with hx HTN, COPD, L sided intracranial aneurysm, recent endovascular treatment of R internal carotid artery intracranial aneurysm admitted 10/26 for surgical repair including open access to left CCA then endovascular treatment.   Pt left sedated/intubated post op and PCCM consulted for vent management.   STUDIES:    SIGNIFICANT EVENTS: 10/26>> OR/IR for repair L ICA/L ACA    HISTORY OF PRESENT ILLNESS:  69 yo female smoker with hx HTN, COPD, L sided intracranial aneurysm, recent endovascular treatment of R internal carotid artery intracranial aneurysm admitted 10/26 for surgical repair including open access to left CCA then endovascular treatment.   Pt left sedated/intubated post op and PCCM consulted for vent management.   PAST MEDICAL HISTORY :   has a past medical history of Ventricular hypertrophy (04/2009); Pancreatitis; PAC (premature atrial contraction); TIA (transient ischemic attack); Hypertension; Tobacco abuse; Edema of foot; Coronary artery disease (1996); COPD (chronic obstructive pulmonary disease) ([place]); Shortness of breath dyspnea; Saccular aneurysm; PONV (postoperative nausea and vomiting); and Stroke ([place]) (04/2014).  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 Prior to Admission medications   Medication Sig Start Date End Date Taking?  Authorizing Provider  albuterol (PROVENTIL HFA;VENTOLIN HFA) 108 (90 BASE) MCG/ACT inhaler Inhale 1-2 puffs into the lungs every 6 (six) hours as needed for wheezing or shortness of breath.   Yes Historical Provider, MD  aspirin 81 MG tablet Take 81 mg by mouth daily.   Yes Historical Provider, MD  atorvastatin (LIPITOR) 40 MG tablet Take 1 tablet (40 mg total) by mouth daily at 6 PM. 05/27/14  Yes Darlin Coco, MD  clopidogrel (PLAVIX) 75 MG tablet TAKE 1 TABLET BY MOUTH DAILY. 05/27/14  Yes Darlin Coco, MD  lisinopril-hydrochlorothiazide (PRINZIDE,ZESTORETIC) 20-12.5 MG tablet TAKE 1 TABLET BY MOUTH DAILY. 10/23/14  Yes Darlin Coco, MD  TOPROL XL 50 MG 24 hr tablet TAKE 1/2 TABLET BY MOUTH TWICE DAILY. 07/01/14  Yes Darlin Coco, MD   Allergies  Allergen Reactions  . Dilaudid [Hydromorphone] Swelling  . Codeine Nausea And Vomiting  . Hydromorphone Hcl Swelling  . Latex Swelling    gloves used at dental office caused lips to swell. Used different ones no problem. Never had any problem at hospital or anywhere else.  [facility] Cross Reactors Other (See Comments)    Unknown childhood reaction    FAMILY HISTORY:  indicated that her mother is deceased. She indicated that her father is deceased. She indicated that her sister is alive.  SOCIAL HISTORY:  reports that she quit smoking about 6 months ago. Her smoking use included Cigarettes. She has a 30 pack-year smoking history. She has never used smokeless tobacco. She reports that she does not drink alcohol or use illicit drugs.  REVIEW OF SYSTEMS:  Unable, sedated post op.   SUBJECTIVE:   VITAL SIGNS: Temp:  [97.6 F (36.4 C)] 97.6 F (36.4 C) (10/26 0711) Pulse Rate:  [57] 57 (10/26 0648) Resp:  [20] 20 (10/26 0648) BP: (143)/(68) 143/68  mmHg (10/26 0648) SpO2:  [98 %] 98 % (10/26 0648) FiO2 (%):  [100 %] 100 % (10/26 1403) Weight:  [152 lb (68.947 kg)] 152 lb (68.947 kg) (10/26 0648) HEMODYNAMICS:   VENTILATOR  SETTINGS: Vent Mode:  [-] PRVC FiO2 (%):  [100 %] 100 % Set Rate:  [18 bmp] 18 bmp Vt Set:  [420 mL-550 mL] 420 mL PEEP:  [5 cmH20] 5 cmH20 Plateau Pressure:  [19 cmH20] 19 cmH20 INTAKE / OUTPUT:  Intake/Output Summary (Last 24 hours) at 11/18/14 1431 Last data filed at 11/18/14 1347  Gross per 24 hour  Intake   1400 ml  Output    600 ml  Net    800 ml    PHYSICAL EXAMINATION: General:  wdwn older female, NAD on vent post op  Neuro:  Sedated, RASS -2 post op HEENT:  Mm moist, no JVD, L neck incision c/d  Cardiovascular:  s1s2 rrr Lungs:  resps even non labored on full vent support, few scattered rhonchi  Abdomen:  Soft, hypoactive bs  Musculoskeletal:  Warm and dry, scant BLE edema   LABS:  CBC  Recent Labs Lab 11/13/14 1006  WBC 5.2  HGB 12.6  HCT 36.7  PLT 124*   Coag's  Recent Labs Lab 11/13/14 1006  APTT 26  INR 1.09   BMET  Recent Labs Lab 11/13/14 1006  NA 139  K 4.0  CL 106  CO2 26  BUN 13  CREATININE 0.80  GLUCOSE 94   Electrolytes  Recent Labs Lab 11/13/14 1006  CALCIUM 8.6*   Sepsis Markers No results for input(s): LATICACIDVEN, PROCALCITON, O2SATVEN in the last 168 hours. ABG No results for input(s): PHART, PCO2ART, PO2ART in the last 168 hours. Liver Enzymes  Recent Labs Lab 11/13/14 1006  AST 19  ALT 11*  ALKPHOS 41  BILITOT 0.7  ALBUMIN 3.4*   Cardiac Enzymes No results for input(s): TROPONINI, PROBNP in the last 168 hours. Glucose No results for input(s): GLUCAP in the last 168 hours.  Imaging No results found.   ASSESSMENT / PLAN:   NEUROLOGIC Left-sided intracranial, supraclinoid intracranial aneurysm - s/p direct access and IR endovascular repair 10/26 P:   RASS goal: -1 Propofol, PRN fentanyl  Post op care per vascular  WUA in am  BP management   PULMONARY OETT 10/26>>>  Ventilator support - post op intracranial aneurysm repair  P:   Vent support - 8cc/kg  F/u CXR  F/u ABG SBT in am    CARDIOVASCULAR Hx HTN  Hx CAS P:  Hold home metoprolol, lisinopril, plavix, ASA BP management - BP wnl as of now on propofol  hold home anti-HTN as above   RENAL No active issue  P:   Chem now and in am   GASTROINTESTINAL No active issue  P:   PPI  NPO for now  Consider TF if no extubation in am   HEMATOLOGIC Thrombocytopenia - mild  P:  F/u CBC  SCD's - Defer post-procedure anticoagulation to vascular/IR  INFECTIOUS No active issue  P:   Monitor fever, wbc curve off abx   ENDOCRINE No active issue   P:   Monitor glucose on chem     FAMILY  - Updates:  No family available 10/26 - updated post op by VVS  Nickolas Madrid, NP 11/18/2014  2:31 PM Pager: [phone] or [phone]  Attending Note:  69 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  IR  requests that we keep patient sedated and intubated.  Target SBP of 120-140 mmHg. Will maintain sedated and intubated overnight.  SBT in AM with potential extubation in AM.  BS are coarse bilaterally.  Will check ABG and CXR now and adjust vent accordingly.  Held home medications for now.  The patient is critically ill with multiple organ systems failure and requires high complexity decision making for assessment and support, frequent evaluation and titration of therapies, application of advanced monitoring technologies and extensive interpretation of multiple databases.   Critical Care Time devoted to patient care services described in this note is  35  Minutes. This time reflects time of care of this signee Dr Jennet Maduro. This critical care time does not reflect procedure time, or teaching time or supervisory time of PA/NP/Med student/Med Resident etc but could involve care discussion time.  Rush Farmer, M.D. [facility] Pulmonary/Critical Care Medicine. Pager: [phone]. After hours pager: [phone].

## 2014-11-19 ENCOUNTER — Encounter (HOSPITAL_COMMUNITY): Payer: Self-pay | Admitting: Certified Registered Nurse Anesthetist

## 2014-11-19 ENCOUNTER — Inpatient Hospital Stay (HOSPITAL_COMMUNITY): Payer: Medicare Other

## 2014-11-19 ENCOUNTER — Encounter (HOSPITAL_COMMUNITY): Payer: Self-pay | Admitting: Vascular Surgery

## 2014-11-19 DIAGNOSIS — I10 Essential (primary) hypertension: Secondary | ICD-10-CM | POA: Insufficient documentation

## 2014-11-19 DIAGNOSIS — I671 Cerebral aneurysm, nonruptured: Secondary | ICD-10-CM | POA: Diagnosis not present

## 2014-11-19 DIAGNOSIS — Z789 Other specified health status: Secondary | ICD-10-CM

## 2014-11-19 DIAGNOSIS — Z978 Presence of other specified devices: Secondary | ICD-10-CM | POA: Insufficient documentation

## 2014-11-19 LAB — BASIC METABOLIC PANEL
ANION GAP: 5 (ref 5–15)
BUN: 11 mg/dL (ref 6–20)
CHLORIDE: 104 mmol/L (ref 101–111)
CO2: 26 mmol/L (ref 22–32)
Calcium: 7.6 mg/dL — ABNORMAL LOW (ref 8.9–10.3)
Creatinine, Ser: 0.74 mg/dL (ref 0.44–1.00)
GFR calc non Af Amer: >60 mL/min (ref >60)
Glucose, Bld: 123 mg/dL — ABNORMAL HIGH (ref 65–99)
Potassium: 3.7 mmol/L (ref 3.5–5.1)
Sodium: 135 mmol/L (ref 135–145)

## 2014-11-19 LAB — CBC WITH DIFFERENTIAL/PLATELET
Basophils Absolute: 0 10*3/uL (ref 0.0–0.1)
Basophils Relative: 0 %
Eosinophils Absolute: 0 10*3/uL (ref 0.0–0.7)
Eosinophils Relative: 0 %
HEMATOCRIT: 29.2 % — AB (ref 36.0–46.0)
HEMOGLOBIN: 9.8 g/dL — AB (ref 12.0–15.0)
LYMPHS ABS: 0.8 10*3/uL (ref 0.7–4.0)
LYMPHS PCT: 15 %
MCH: 28.2 pg (ref 26.0–34.0)
MCHC: 33.6 g/dL (ref 30.0–36.0)
MCV: 83.9 fL (ref 78.0–100.0)
Monocytes Absolute: 0.4 10*3/uL (ref 0.1–1.0)
Monocytes Relative: 8 %
NEUTROS PCT: 77 %
Neutro Abs: 3.9 10*3/uL (ref 1.7–7.7)
Platelets: 104 10*3/uL — ABNORMAL LOW (ref 150–400)
RBC: 3.48 MIL/uL — AB (ref 3.87–5.11)
RDW: 14.3 % (ref 11.5–15.5)
WBC: 5 10*3/uL (ref 4.0–10.5)

## 2014-11-19 LAB — PLATELET INHIBITION P2Y12: Platelet Function  P2Y12: 139 [PRU] — ABNORMAL LOW (ref 194–418)

## 2014-11-19 LAB — HEPARIN LEVEL (UNFRACTIONATED)

## 2014-11-19 MED ORDER — DEXAMETHASONE SODIUM PHOSPHATE 4 MG/ML IJ SOLN
2.0000 mg | Freq: Four times a day (QID) | INTRAMUSCULAR | Status: AC
  Administered 2014-11-19: 2 mg via INTRAVENOUS
  Filled 2014-11-19: qty 1

## 2014-11-19 MED ORDER — FUROSEMIDE 10 MG/ML IJ SOLN
10.0000 mg | Freq: Once | INTRAMUSCULAR | Status: AC
  Administered 2014-11-19: 10 mg via INTRAVENOUS
  Filled 2014-11-19: qty 2

## 2014-11-19 NOTE — Progress Notes (Signed)
Pt has negative cuff leak at this time, Dr. Ashok Cordia called and made aware, will hold extubation for today, new order for steroid, will re-eval in am, RT will continue to monitor

## 2014-11-19 NOTE — Progress Notes (Signed)
ANTICOAGULATION CONSULT NOTE - Follow Up Consult  Pharmacy Consult for heparin Indication: s/p coil embolization   Labs:  Recent Labs  11/18/14 1416 11/18/14 1823 11/19/14 0328 11/19/14 0345  HGB 10.7*  --  9.8*  --   HCT 31.6*  --  29.2*  --   PLT 103*  --  104*  --   APTT  --  86*  --   --   HEPARINUNFRC  --   --   --  <0.10*  CREATININE 0.63  --  0.74  --      Assessment: 68yo female subtherapeutic on heparin s/p coil embolization.  Goal of Therapy:  Heparin level 0.1-0.25 units/ml   Plan:  Will increase heparin gtt by 2 units/kg/hr to 650 units/hr until off at 0700.  Wynona Neat, PharmD, BCPS  11/19/2014,4:54 AM

## 2014-11-19 NOTE — Progress Notes (Signed)
PCCM Attending Note:  Notified by respiratory therapist that the patient had 0 cuff leak. Administering Lasix 10 mg IV 1 & Decadron 2 mg IV every 6 hours 2 doses. Plan for repeat spontaneous breathing trial morning & repeat attempt at extubation if cuff leak is present.  Sonia Baller Ashok Cordia, M.D. [place] [facility] Pager:  [phone] After 3pm or if no response, call [phone]

## 2014-11-19 NOTE — Progress Notes (Signed)
Referring Physician(s): Dr Oneida Alar  Chief Complaint:  S/p Lt common carotid arteriogram. Direct lt common carotid access Placement of x 2 pipeline flow diverters across large irregular LT ICA /LT ACA A 1 junction 11/18/2014   Subjective:  Overnight without event Still intubated Can respond to me with nods  Denies pain P2y12: 139 today  Allergies: Dilaudid; Codeine; Hydromorphone hcl; Latex; and Sulfa drugs cross reactors  Medications: Prior to Admission medications   Medication Sig Start Date End Date Taking? Authorizing Provider  albuterol (PROVENTIL HFA;VENTOLIN HFA) 108 (90 BASE) MCG/ACT inhaler Inhale 1-2 puffs into the lungs every 6 (six) hours as needed for wheezing or shortness of breath.   Yes Historical Provider, MD  aspirin 81 MG tablet Take 81 mg by mouth daily.   Yes Historical Provider, MD  atorvastatin (LIPITOR) 40 MG tablet Take 1 tablet (40 mg total) by mouth daily at 6 PM. 05/27/14  Yes Darlin Coco, MD  clopidogrel (PLAVIX) 75 MG tablet TAKE 1 TABLET BY MOUTH DAILY. 05/27/14  Yes Darlin Coco, MD  lisinopril-hydrochlorothiazide (PRINZIDE,ZESTORETIC) 20-12.5 MG tablet TAKE 1 TABLET BY MOUTH DAILY. 10/23/14  Yes Darlin Coco, MD  TOPROL XL 50 MG 24 hr tablet TAKE 1/2 TABLET BY MOUTH TWICE DAILY. 07/01/14  Yes Darlin Coco, MD     Vital Signs: BP 134/68 mmHg  Pulse 63  Temp(Src) 97.5 F (36.4 C) (Oral)  Resp 19  Ht 5\' 1"  (0.626 m)  Wt 152 lb 1.9 oz (69 kg)  BMI 28.76 kg/m2  SpO2 99%  Physical Exam  Neck:  Left neck site is ecchymotic with some swelling No hematoma Site is clean and dry  Cardiovascular: Normal rate and regular rhythm.   Musculoskeletal: Normal range of motion.  Pt does move all 4s to command Answers questions appropriately with nods   Neurological:  Opens eyes to me Tries to reach for intubation tubing  Skin: Skin is warm.  Nursing note and vitals reviewed.   Imaging: Dg Chest Port 1 View  11/19/2014   CLINICAL DATA:  Respiratory failure EXAM: PORTABLE CHEST 1 VIEW COMPARISON:  Yesterday FINDINGS: Endotracheal and NG tubes are stable. Cardiomegaly. Left basilar patchy density is increased. No pneumothorax. IMPRESSION: Increased atelectasis versus airspace disease at the left base. Electronically Signed   By: Marybelle Killings M.D.   On: 11/19/2014 07:49   Dg Chest Portable 1 View  11/18/2014  CLINICAL DATA:  Hypoxia EXAM: PORTABLE CHEST 1 VIEW COMPARISON:  September 01, 2014 FINDINGS: Endotracheal tube tip is 2.5 cm above the carina. Nasogastric tube tip and side port are in the stomach. No pneumothorax. There is no edema or consolidation. Heart is upper normal in size with pulmonary vascularity within normal limits. No adenopathy. No bone lesions. IMPRESSION: Tube positions as described without pneumothorax. No edema or consolidation. Electronically Signed   By: Lowella Grip III M.D.   On: 11/18/2014 15:23    Labs:  CBC:  Recent Labs  08/14/14 0237 11/13/14 1006 11/18/14 1416 11/19/14 0328  WBC 4.8 5.2 4.2 5.0  HGB 9.1* 12.6 10.7* 9.8*  HCT 27.9* 36.7 31.6* 29.2*  PLT 102* 124* 103* 104*    COAGS:  Recent Labs  05/12/14 1450 08/07/14 1048 08/12/14 1615 08/12/14 2050 11/13/14 1006 11/18/14 1823  INR 1.02 1.01 1.21  --  1.09  --   APTT  --  29 >200* 34 26 86*    BMP:  Recent Labs  08/14/14 0237 11/13/14 1006 11/18/14 1416 11/19/14 0328  NA 144 139  139 135  K 3.4* 4.0 3.7 3.7  CL 112* 106 104 104  CO2 25 26 25 26   GLUCOSE 97 94 117* 123*  BUN 12 13 11 11   CALCIUM 7.3* 8.6* 8.3* 7.6*  CREATININE 0.69 0.80 0.63 0.74  GFRNONAA >60 >60 >60 >60  GFRAA >60 >60 >60 >60    LIVER FUNCTION TESTS:  Recent Labs  05/03/14 1910 08/07/14 1048 11/13/14 1006  BILITOT 1.1 0.6 0.7  AST 29 20 19   ALT 20 17 11*  ALKPHOS 42 40 41  PROT 6.9 6.6 6.6  ALBUMIN 3.6 3.5 3.4*    Assessment and Plan:  Left common carotid cut down exposure for intervention 10/26 2 pipeline  stents L ICA and L ACA Intubated p2y12 139 today Plan for US neck today Plan for extubation today Hope to dc home 10/28 Will report to Dr [name]  Signed: Monia Sabal A 11/19/2014, 8:38 AM   I spent a total of 15 Minutes at the the patient's bedside AND on the patient's hospital floor or unit, greater than 50% of which was counseling/coordinating care for L ICA/ACA pipeline stent placement

## 2014-11-19 NOTE — Progress Notes (Signed)
Awake and following commands No hematoma in neck Hopefully extubate this morning  Call if questions Needs follow up with me in 2 weeks to  Check incision  Ruta Hinds, MD [facility]: [phone] Pager: [phone]

## 2014-11-19 NOTE — Progress Notes (Addendum)
Pt had 5 beat run of VTach. RN in to assess. Pt currently on Propofol, easy to awaken by voice. A&Ox4 and follows commands. Pt is asymptomatic and denies any discomfort. Reported this to [place], Therapist, sports in [place]. No new orders at this time. Will continue to monitor pt closely.   2350: BMET ordered for 10/28 with 0500 labs

## 2014-11-19 NOTE — Progress Notes (Signed)
PULMONARY / CRITICAL CARE MEDICINE   Name: Jeanne Lawson MRN: [id number] DOB: 12-19-1945    ADMISSION DATE:  11/18/2014 CONSULTATION DATE:  10/26  REFERRING MD :  Oneida Alar   CHIEF COMPLAINT:  Vent management   INITIAL PRESENTATION: 69 yo female smoker with hx HTN, COPD, L sided intracranial aneurysm, recent endovascular treatment of R internal carotid artery intracranial aneurysm admitted 10/26 for surgical repair including open access to left CCA then endovascular treatment.   Pt left sedated/intubated post op and PCCM consulted for vent management.   STUDIES:  Soft Tissue U/S 10/27:  Negative for left neck hematoma  SIGNIFICANT EVENTS: 10/26 - OR/IR for repair L ICA/L ACA   SUBJECTIVE: Patient had no acute events overnight. This morning she was awake on minimal sedation and following commands. Interventional radiology & vascular surgery both in agreement with extubation without sign of hematoma.  REVIEW OF SYSTEMS:  Unable to assess as the patient was intubated at the time of my exam.  VITAL SIGNS: Temp:  [95.2 F (35.1 C)-98.3 F (36.8 C)] 97.5 F (36.4 C) (10/27 0734) Pulse Rate:  [47-73] 63 (10/27 0814) Resp:  [18-23] 19 (10/27 0814) BP: (87-148)/(40-89) 134/68 mmHg (10/27 0814) SpO2:  [99 %-100 %] 99 % (10/27 0814) Arterial Line BP: (76-164)/(43-86) 164/86 mmHg (10/27 0800) FiO2 (%):  [40 %-100 %] 40 % (10/27 0814) Weight:  [152 lb 1.9 oz (69 kg)] 152 lb 1.9 oz (69 kg) (10/27 0447) HEMODYNAMICS:   VENTILATOR SETTINGS: Vent Mode:  [-] PSV FiO2 (%):  [40 %-100 %] 40 % Set Rate:  [18 bmp] 18 bmp Vt Set:  [420 mL-550 mL] 420 mL PEEP:  [5 cmH20] 5 cmH20 Pressure Support:  [5 cmH20] 5 cmH20 Plateau Pressure:  [17 cmH20-19 cmH20] 18 cmH20 INTAKE / OUTPUT:  Intake/Output Summary (Last 24 hours) at 11/19/14 1032 Last data filed at 11/19/14 0800  Gross per 24 hour  Intake 3152.02 ml  Output   1130 ml  Net 2022.02 ml    PHYSICAL EXAMINATION: General:  Awake. Alert.  No acute distress.   Integument:  Warm & dry. No rash on exposed skin. Surgical incision over left neck noted. Clean, dry, & in tact. HEENT:  No scleral injection or icterus. Endotracheal tube in place. PERRL. Cardiovascular:  Regular rate.  Bilateral lower extremity edema No appreciable JVD.  Pulmonary:  Coarse breath sounds bilaterally Symmetric chest wall eon ventilator Abdomen: Soft. Normal bowel sounds. Nondistended.  Neurological: Strength symmetric. Patient following commands bilaterally appropriately.  LABS:  CBC  Recent Labs Lab 11/13/14 1006 11/18/14 1416 11/19/14 0328  WBC 5.2 4.2 5.0  HGB 12.6 10.7* 9.8*  HCT 36.7 31.6* 29.2*  PLT 124* 103* 104*   Coag's  Recent Labs Lab 11/13/14 1006 11/18/14 1823  APTT 26 86*  INR 1.09  --    BMET  Recent Labs Lab 11/13/14 1006 11/18/14 1416 11/19/14 0328  NA 139 139 135  K 4.0 3.7 3.7  CL 106 104 104  CO2 26 25 26   BUN 13 11 11   CREATININE 0.80 0.63 0.74  GLUCOSE 94 117* 123*   Electrolytes  Recent Labs Lab 11/13/14 1006 11/18/14 1416 11/19/14 0328  CALCIUM 8.6* 8.3* 7.6*   Sepsis Markers  Recent Labs Lab 11/18/14 1416  LATICACIDVEN 1.7  PROCALCITON <0.10   ABG  Recent Labs Lab 11/18/14 1550  PHART 7.430  PCO2ART 37.6  PO2ART 373*   Liver Enzymes  Recent Labs Lab 11/13/14 1006  AST 19  ALT 11*  ALKPHOS 41  BILITOT 0.7  ALBUMIN 3.4*   Cardiac Enzymes No results for input(s): TROPONINI, PROBNP in the last 168 hours. Glucose No results for input(s): GLUCAP in the last 168 hours.  Imaging US Soft Tissue Head/neck  11/19/2014  CLINICAL DATA:  Recent direct carotid access for intracranial intervention EXAM: ULTRASOUND OF HEAD/NECK SOFT TISSUES TECHNIQUE: Ultrasound examination of the head and neck soft tissues was performed in the area of clinical concern. COMPARISON:  Arteriogram from the previous day FINDINGS: Limited views of the left carotid system are unremarkable. No evidence of  hematoma or pseudoaneurysm. This was not a dedicated vascular study. IMPRESSION: Negative for left neck hematoma Electronically Signed   By: Lucrezia Europe M.D.   On: 11/19/2014 10:22   Dg Chest Port 1 View  11/19/2014  CLINICAL DATA:  Respiratory failure EXAM: PORTABLE CHEST 1 VIEW COMPARISON:  Yesterday FINDINGS: Endotracheal and NG tubes are stable. Cardiomegaly. Left basilar patchy density is increased. No pneumothorax. IMPRESSION: Increased atelectasis versus airspace disease at the left base. Electronically Signed   By: Marybelle Killings M.D.   On: 11/19/2014 07:49   Dg Chest Portable 1 View  11/18/2014  CLINICAL DATA:  Hypoxia EXAM: PORTABLE CHEST 1 VIEW COMPARISON:  September 01, 2014 FINDINGS: Endotracheal tube tip is 2.5 cm above the carina. Nasogastric tube tip and side port are in the stomach. No pneumothorax. There is no edema or consolidation. Heart is upper normal in size with pulmonary vascularity within normal limits. No adenopathy. No bone lesions. IMPRESSION: Tube positions as described without pneumothorax. No edema or consolidation. Electronically Signed   By: Lowella Grip III M.D.   On: 11/18/2014 15:23     ASSESSMENT / PLAN:  NEUROLOGIC A: Left-sided intracranial, supraclinoid intracranial aneurysm - s/p direct access and IR endovascular repair 10/26  P:   RASS goal: 0 to -1 Propofol & PRN fentanyl OV BP management per Vascular Surgery & Interventional Neurology  PULMONARY OETT 10/26>>>  Ventilator support - post op intracranial aneurysm repair   P:   Vent support - 8cc/kg  Checking for cuff leak & probable extubation this AM  CARDIOVASCULAR A: H/O HTN  H/O CAS P:  Hold home metoprolol, lisinopril, plavix, ASA BP management per Interventional Radiology & Vascular Surgery   RENAL A: No active issue   P:   Monitoring UOP  Monitoring Renal Function w/ BUN/Creatinine  GASTROINTESTINAL A: No active issue   P:   Protonix IV daily NPO for now  Consider  bedside swallow assessment by RN if able to extubate  HEMATOLOGIC A: Thrombocytopenia - mild  Anemia - mild. No active bleeding.  P:  Trending Hgb & Platelets daily w/ CBC SCDs  INFECTIOUS A: No active issue   P:   Monitor for fever or leukocytosis.  ENDOCRINE A: No active issue    P:   Monitor glucose on chemistry panel.  FAMILY  - Updates:  No family available 10/27.  TODAY'S SUMMARY: 69 year old Caucasian female status post aneurysmal repair. Patient has no signs of postoperative hematoma. Plan for extubation if cuff leak is present today. Further management of blood pressure, anticoagulation, & antiplatelet therapy per vascular surgery & interventional radiology.   I have spent a total of 33 minutes of critical care time this morning caring for the patient, reviewing the patient's electronic medical record, and discussing the plan for extubation with the patient's respiratory therapist and nurse at bedside.  Sonia Baller Ashok Cordia, M.D. [facility] Pager:  [phone] After  3pm or if no response, call [phone]  11/19/2014  10:32 AM

## 2014-11-20 ENCOUNTER — Telehealth: Payer: Self-pay | Admitting: Vascular Surgery

## 2014-11-20 DIAGNOSIS — E876 Hypokalemia: Secondary | ICD-10-CM

## 2014-11-20 LAB — PLATELET INHIBITION P2Y12: Platelet Function  P2Y12: 87 [PRU] — ABNORMAL LOW (ref 194–418)

## 2014-11-20 LAB — BASIC METABOLIC PANEL
Anion gap: 8 (ref 5–15)
BUN: 13 mg/dL (ref 6–20)
CHLORIDE: 108 mmol/L (ref 101–111)
CO2: 26 mmol/L (ref 22–32)
CREATININE: 0.7 mg/dL (ref 0.44–1.00)
Calcium: 8.1 mg/dL — ABNORMAL LOW (ref 8.9–10.3)
GFR calc Af Amer: >60 mL/min (ref >60)
GFR calc non Af Amer: >60 mL/min (ref >60)
GLUCOSE: 82 mg/dL (ref 65–99)
Potassium: 3.4 mmol/L — ABNORMAL LOW (ref 3.5–5.1)
Sodium: 142 mmol/L (ref 135–145)

## 2014-11-20 MED ORDER — DIPHENHYDRAMINE HCL 12.5 MG/5ML PO ELIX: 25.0000 mg | ORAL_SOLUTION | Freq: Four times a day (QID) | ORAL | Status: DC | PRN

## 2014-11-20 MED ORDER — FUROSEMIDE 10 MG/ML IJ SOLN
10.0000 mg | Freq: Two times a day (BID) | INTRAMUSCULAR | Status: AC
  Administered 2014-11-20 (×2): 10 mg via INTRAVENOUS
  Filled 2014-11-20 (×2): qty 2

## 2014-11-20 MED ORDER — POTASSIUM CHLORIDE 20 MEQ/15ML (10%) PO SOLN
40.0000 meq | Freq: Once | ORAL | Status: AC
Start: 2014-11-20 — End: 2014-11-20
  Administered 2014-11-20: 40 meq
  Filled 2014-11-20: qty 30

## 2014-11-20 MED ORDER — DEXAMETHASONE SODIUM PHOSPHATE 4 MG/ML IJ SOLN
2.0000 mg | Freq: Four times a day (QID) | INTRAMUSCULAR | Status: AC
  Administered 2014-11-20 (×3): 2 mg via INTRAVENOUS
  Filled 2014-11-20 (×3): qty 1

## 2014-11-20 NOTE — Telephone Encounter (Signed)
LM for pt re appt, dpm °

## 2014-11-20 NOTE — Progress Notes (Signed)
Cuff leak 0 at this time.  MD at bedside, will hold extubation, add steriods, re-eval this afternoon, increased PS to 8 for pt comfort. RT will monitor

## 2014-11-20 NOTE — Progress Notes (Signed)
PULMONARY / CRITICAL CARE MEDICINE   Name: Jeanne Lawson MRN: [id number] DOB: February 11, 1945    ADMISSION DATE:  11/18/2014 CONSULTATION DATE:  10/26  REFERRING MD :  Oneida Alar   CHIEF COMPLAINT:  Vent management   INITIAL PRESENTATION: 69 yo female smoker with hx HTN, COPD, L sided intracranial aneurysm, recent endovascular treatment of R internal carotid artery intracranial aneurysm admitted 10/26 for surgical repair including open access to left CCA then endovascular treatment.   Pt left sedated/intubated post op and PCCM consulted for vent management.   STUDIES:  Soft Tissue U/S 10/27:  Negative for left neck hematoma  SIGNIFICANT EVENTS: 10/26 - OR/IR for repair L ICA/L ACA   SUBJECTIVE: No acute events overnight. Patient denies any difficulty breathing or chest pain at this time. She is awake and able to write notes on a pad. Patient is complaining of some itching around her surgical incision site.  REVIEW OF SYSTEMS:  Patient denies any nausea or vomiting. She denies any headache or trouble seeing.  VITAL SIGNS: Temp:  [96.9 F (36.1 C)-98.3 F (36.8 C)] 97 F (36.1 C) (10/28 0800) Pulse Rate:  [43-95] 88 (10/28 1114) Resp:  [15-25] 25 (10/28 1114) BP: (89-166)/(44-108) 126/59 mmHg (10/28 1114) SpO2:  [95 %-100 %] 100 % (10/28 1114) Arterial Line BP: (67-200)/(48-104) 81/71 mmHg (10/28 1000) FiO2 (%):  [40 %] 40 % (10/28 1114) Weight:  [160 lb 4.4 oz (72.7 kg)] 160 lb 4.4 oz (72.7 kg) (10/28 0425) HEMODYNAMICS:   VENTILATOR SETTINGS: Vent Mode:  [-] PSV FiO2 (%):  [40 %] 40 % Set Rate:  [18 bmp] 18 bmp Vt Set:  [420 mL] 420 mL PEEP:  [5 cmH20] 5 cmH20 Pressure Support:  [5 cmH20-10 cmH20] 8 cmH20 Plateau Pressure:  [13 cmH20-15 cmH20] 13 cmH20 INTAKE / OUTPUT:  Intake/Output Summary (Last 24 hours) at 11/20/14 1119 Last data filed at 11/20/14 1000  Gross per 24 hour  Intake 1931.8 ml  Output   2040 ml  Net -108.2 ml    PHYSICAL EXAMINATION: General:  Awake.  No distress off of sedation. Patient sitting up in bed. Integument:  Warm & dry. No rash on exposed skin. Surgical incision over left neck noted is clean and dry. HEENT:  No scleral injection. Endotracheal tube in place. PERRL. Cardiovascular:  Regular rate.  Bilateral lower extremity edema persists and is unchanged. No appreciable JVD.  Pulmonary:  Coarse breath sounds bilaterally. Symmetric chest rise on ventilator. Abdomen: Soft. Normal bowel sounds. Nondistended.  Neurological: Strength symmetric. Patient following commands bilaterally appropriately. Writing notes.  LABS:  CBC  Recent Labs Lab 11/18/14 1416 11/19/14 0328  WBC 4.2 5.0  HGB 10.7* 9.8*  HCT 31.6* 29.2*  PLT 103* 104*   Coag's  Recent Labs Lab 11/18/14 1823  APTT 86*   BMET  Recent Labs Lab 11/18/14 1416 11/19/14 0328 11/20/14 0504  NA 139 135 142  K 3.7 3.7 3.4*  CL 104 104 108  CO2 25 26 26   BUN 11 11 13   CREATININE 0.63 0.74 0.70  GLUCOSE 117* 123* 82   Electrolytes  Recent Labs Lab 11/18/14 1416 11/19/14 0328 11/20/14 0504  CALCIUM 8.3* 7.6* 8.1*   Sepsis Markers  Recent Labs Lab 11/18/14 1416  LATICACIDVEN 1.7  PROCALCITON <0.10   ABG  Recent Labs Lab 11/18/14 1550  PHART 7.430  PCO2ART 37.6  PO2ART 373*   Liver Enzymes No results for input(s): AST, ALT, ALKPHOS, BILITOT, ALBUMIN in the last 168 hours. Cardiac Enzymes No results  for input(s): TROPONINI, PROBNP in the last 168 hours. Glucose No results for input(s): GLUCAP in the last 168 hours.  Imaging No results found.   ASSESSMENT / PLAN:  NEUROLOGIC A: Left-sided intracranial, supraclinoid intracranial aneurysm - s/p direct access and IR endovascular repair 10/26  P:   ASA 325mg  daily Plavix 75mg  daily RASS goal: 0 to -1 Propofol gtt Fentanyl IV prn BP management per Vascular Surgery & Interventional Neurology  PULMONARY OETT 10/26>>>  Ventilator support - post op intracranial aneurysm repair   No cuff leak  P:   Vent support - 8cc/kg  Decadron 2 mg IV every 6 hours Lasix 10 mg IV every 12 hours SBT & check for cuff leak AM 10/29  CARDIOVASCULAR A: H/O HTN  H/O CAS  P:  Hold home metoprolol & lisinopril ASA 325mg  daily Plavix 75mg  daily BP management per Interventional Radiology & Vascular Surgery   RENAL A: Hypokalemia - Mild. Replacing via tube.   P:   Monitoring UOP  Monitoring Renal Function w/ BUN/Creatinine Monitor electrolytes daily  GASTROINTESTINAL A: No active issue   P:   Protonix IV daily NPO for now  Evaluate swallowing after extubation  HEMATOLOGIC A: Thrombocytopenia - Mild  Anemia - Mild. No active bleeding.  P:  Trending Hgb & Platelets daily w/ CBC SCDs  INFECTIOUS A: No active issue   P:   Monitor for fever or leukocytosis.  ENDOCRINE A: No active issue    P:   Monitor glucose on chemistry panel.  FAMILY  - Updates:  No family available 10/28.  TODAY'S SUMMARY: 69 year old Caucasian female status post aneurysmal repair. Patient has no signs of postoperative hematoma. She continues to have no evidence of cuff leak on my bedside evaluation today. I'm scheduling Lasix as well as Decadron to decrease retropharyngeal edema in the hopes that she will be able to extubate tomorrow. Patient is appropriately concerned but I explained to her that this is not in anyway life-threatening at this moment just that we are being careful to prevent the risk of reactivation or complications with extubation.   I have spent a total of 36 minutes of critical care time this morning caring for the patient & reviewing the patient's electronic medical record.  Sonia Baller Ashok Cordia, M.D. [facility] Pulmonary & Critical Care Pager:  [phone] After 3pm or if no response, call [phone]  11/20/2014  11:19 AM

## 2014-11-20 NOTE — Progress Notes (Signed)
Initial Nutrition Assessment   INTERVENTION:   If pt remains intubated recommend initiate the adult tube feeding protocol.    NUTRITION DIAGNOSIS:   Inadequate oral intake related to inability to eat as evidenced by NPO status.  GOAL:   Patient will meet greater than or equal to 90% of their needs  MONITOR:   Vent status, Labs, Weight trends  REASON FOR ASSESSMENT:   Ventilator    ASSESSMENT:   69 yo female smoker with hx HTN, COPD, L sided intracranial aneurysm, recent endovascular treatment of R internal carotid artery intracranial aneurysm admitted 10/26 for surgical repair including open access to left CCA then endovascular treatment.  Patient is currently intubated on ventilator support MV: 9.2 L/min Temp (24hrs), Avg:97.5 F (36.4 C), Min:96.9 F (36.1 C), Max:98.3 F (36.8 C)  Propofol: 14.5 ml/hr provides 382 kcal per day from lipid  Labs reviewed: potassium low Pt currently does not have a cuff leak and is unable to be extubated. Pt on decadron in hopes of extubation tomorrow.  Pt discussed during ICU rounds and with RN.   Unable to complete Nutrition-Focused physical exam at this time.   Diet Order:  Diet NPO time specified  Skin:  Reviewed, no issues  Last BM:  unknown  Height:   Ht Readings from Last 1 Encounters:  11/18/14 5\' 1"  (1.549 m)   Weight:   Wt Readings from Last 1 Encounters:  11/20/14 160 lb 4.4 oz (72.7 kg)  Admission weight: 152 lb (69 kg) 10/26  Ideal Body Weight:  47.7 kg  BMI:  Body mass index is 30.3 kg/(m^2).  Estimated Nutritional Needs:   Kcal:  1324  Protein:  85-100 grams  Fluid:  > 1.5 L/day  EDUCATION NEEDS:   No education needs identified at this time  [place], [place], [place] Pager [phone] After Hours Pager

## 2014-11-20 NOTE — Telephone Encounter (Signed)
-----   Message from Gabriel Earing, [place] sent at 11/20/2014  8:20 AM EDT ----- F/u with Dr. Oneida Alar in 2 weeks s/p carotid exposure for IR procedure.  Thanks, Aldona Bar

## 2014-11-20 NOTE — Progress Notes (Signed)
Referring Physician(s): Dr Oneida Alar  Chief Complaint:  Direct L common carotid access Placed 2 pipeline stents across L ICA and Ll ACA jxn-- 11/18/14  Subjective:  Still intubated Cuff leak still neg this am Holding extubation at this time Will retest later today Pt is communicating by writing on tablet Minimal complaints-- no pain  Allergies: Dilaudid; Codeine; Hydromorphone hcl; Latex; and Sulfa drugs cross reactors  Medications: Prior to Admission medications   Medication Sig Start Date End Date Taking? Authorizing Provider  albuterol (PROVENTIL HFA;VENTOLIN HFA) 108 (90 BASE) MCG/ACT inhaler Inhale 1-2 puffs into the lungs every 6 (six) hours as needed for wheezing or shortness of breath.   Yes Historical Provider, MD  aspirin 81 MG tablet Take 81 mg by mouth daily.   Yes Historical Provider, MD  atorvastatin (LIPITOR) 40 MG tablet Take 1 tablet (40 mg total) by mouth daily at 6 PM. 05/27/14  Yes Darlin Coco, MD  clopidogrel (PLAVIX) 75 MG tablet TAKE 1 TABLET BY MOUTH DAILY. 05/27/14  Yes Darlin Coco, MD  lisinopril-hydrochlorothiazide (PRINZIDE,ZESTORETIC) 20-12.5 MG tablet TAKE 1 TABLET BY MOUTH DAILY. 10/23/14  Yes Darlin Coco, MD  TOPROL XL 50 MG 24 hr tablet TAKE 1/2 TABLET BY MOUTH TWICE DAILY. 07/01/14  Yes Darlin Coco, MD     Vital Signs: BP 126/58 mmHg  Pulse 50  Temp(Src) 97 F (36.1 C) (Axillary)  Resp 17  Ht 5\' 1"  (1.549 m)  Wt 160 lb 4.4 oz (72.7 kg)  BMI 30.30 kg/m2  SpO2 98%  Physical Exam  Neck:  Lt neck site less swollen today Still ecchymotic but resolving No real tenderness to palpate Able to rotate and move neck easily    Musculoskeletal: Normal range of motion. She exhibits edema.  Able to move all 4s to command Sl weaker on left B feet with a little swelling today 2+ pulse B  Writing on tablet questions and answers Writing is legible and appropriate  Neurological: She is alert.  Skin: Skin is warm and dry.    Psychiatric: She has a normal mood and affect. Her behavior is normal. Thought content normal.  Nursing note and vitals reviewed.   Imaging: US Soft Tissue Head/neck  11/19/2014  CLINICAL DATA:  Recent direct carotid access for intracranial intervention EXAM: ULTRASOUND OF HEAD/NECK SOFT TISSUES TECHNIQUE: Ultrasound examination of the head and neck soft tissues was performed in the area of clinical concern. COMPARISON:  Arteriogram from the previous day FINDINGS: Limited views of the left carotid system are unremarkable. No evidence of hematoma or pseudoaneurysm. This was not a dedicated vascular study. IMPRESSION: Negative for left neck hematoma Electronically Signed   By: Lucrezia Europe M.D.   On: 11/19/2014 10:22   Dg Chest Port 1 View  11/19/2014  CLINICAL DATA:  Respiratory failure EXAM: PORTABLE CHEST 1 VIEW COMPARISON:  Yesterday FINDINGS: Endotracheal and NG tubes are stable. Cardiomegaly. Left basilar patchy density is increased. No pneumothorax. IMPRESSION: Increased atelectasis versus airspace disease at the left base. Electronically Signed   By: Marybelle Killings M.D.   On: 11/19/2014 07:49   Dg Chest Portable 1 View  11/18/2014  CLINICAL DATA:  Hypoxia EXAM: PORTABLE CHEST 1 VIEW COMPARISON:  September 01, 2014 FINDINGS: Endotracheal tube tip is 2.5 cm above the carina. Nasogastric tube tip and side port are in the stomach. No pneumothorax. There is no edema or consolidation. Heart is upper normal in size with pulmonary vascularity within normal limits. No adenopathy. No bone lesions. IMPRESSION: Tube positions  as described without pneumothorax. No edema or consolidation. Electronically Signed   By: Lowella Grip III M.D.   On: 11/18/2014 15:23    Labs:  CBC:  Recent Labs  08/14/14 0237 11/13/14 1006 11/18/14 1416 11/19/14 0328  WBC 4.8 5.2 4.2 5.0  HGB 9.1* 12.6 10.7* 9.8*  HCT 27.9* 36.7 31.6* 29.2*  PLT 102* 124* 103* 104*    COAGS:  Recent Labs  05/12/14 1450  08/07/14 1048 08/12/14 1615 08/12/14 2050 11/13/14 1006 11/18/14 1823  INR 1.02 1.01 1.21  --  1.09  --   APTT  --  29 >200* 34 26 86*    BMP:  Recent Labs  11/13/14 1006 11/18/14 1416 11/19/14 0328 11/20/14 0504  NA 139 139 135 142  K 4.0 3.7 3.7 3.4*  CL 106 104 104 108  CO2 26 25 26 26   GLUCOSE 94 117* 123* 82  BUN 13 11 11 13   CALCIUM 8.6* 8.3* 7.6* 8.1*  CREATININE 0.80 0.63 0.74 0.70  GFRNONAA >60 >60 >60 >60  GFRAA >60 >60 >60 >60    LIVER FUNCTION TESTS:  Recent Labs  05/03/14 1910 08/07/14 1048 11/13/14 1006  BILITOT 1.1 0.6 0.7  AST 29 20 19   ALT 20 17 11*  ALKPHOS 42 40 41  PROT 6.9 6.6 6.6  ALBUMIN 3.6 3.5 3.4*    Assessment and Plan:  Intracranial aneurysm 2 pipeline flow diverter pipeline stents placed over L ICA/ACA jxn Via direct L common carotid artery access Doing well Still intubated Will report to Dr [name]  Signed: Monia Sabal A 11/20/2014, 10:22 AM   I spent a total of 15 Minutes at the the patient's bedside AND on the patient's hospital floor or unit, greater than 50% of which was counseling/coordinating care for aneurysm embolization

## 2014-11-21 DIAGNOSIS — J9600 Acute respiratory failure, unspecified whether with hypoxia or hypercapnia: Secondary | ICD-10-CM

## 2014-11-21 LAB — CBC WITH DIFFERENTIAL/PLATELET
BASOS ABS: 0 10*3/uL (ref 0.0–0.1)
Basophils Relative: 0 %
EOS PCT: 0 %
Eosinophils Absolute: 0 10*3/uL (ref 0.0–0.7)
HCT: 31.1 % — ABNORMAL LOW (ref 36.0–46.0)
Hemoglobin: 10.4 g/dL — ABNORMAL LOW (ref 12.0–15.0)
LYMPHS PCT: 15 %
Lymphs Abs: 0.6 10*3/uL — ABNORMAL LOW (ref 0.7–4.0)
MCH: 28.2 pg (ref 26.0–34.0)
MCHC: 33.4 g/dL (ref 30.0–36.0)
MCV: 84.3 fL (ref 78.0–100.0)
MONO ABS: 0.2 10*3/uL (ref 0.1–1.0)
Monocytes Relative: 6 %
Neutro Abs: 3.3 10*3/uL (ref 1.7–7.7)
Neutrophils Relative %: 80 %
PLATELETS: 109 10*3/uL — AB (ref 150–400)
RBC: 3.69 MIL/uL — ABNORMAL LOW (ref 3.87–5.11)
RDW: 14 % (ref 11.5–15.5)
WBC: 4.2 10*3/uL (ref 4.0–10.5)

## 2014-11-21 LAB — RENAL FUNCTION PANEL
ALBUMIN: 2.8 g/dL — AB (ref 3.5–5.0)
Anion gap: 9 (ref 5–15)
BUN: 14 mg/dL (ref 6–20)
CHLORIDE: 104 mmol/L (ref 101–111)
CO2: 25 mmol/L (ref 22–32)
Calcium: 7.9 mg/dL — ABNORMAL LOW (ref 8.9–10.3)
Creatinine, Ser: 0.7 mg/dL (ref 0.44–1.00)
GFR calc Af Amer: >60 mL/min (ref >60)
GFR calc non Af Amer: >60 mL/min (ref >60)
GLUCOSE: 101 mg/dL — AB (ref 65–99)
PHOSPHORUS: 4.3 mg/dL (ref 2.5–4.6)
POTASSIUM: 3.7 mmol/L (ref 3.5–5.1)
Sodium: 138 mmol/L (ref 135–145)

## 2014-11-21 LAB — MAGNESIUM: Magnesium: 2.1 mg/dL (ref 1.7–2.4)

## 2014-11-21 LAB — TRIGLYCERIDES: Triglycerides: 122 mg/dL (ref <150)

## 2014-11-21 MED ORDER — MENTHOL 3 MG MT LOZG
1.0000 | LOZENGE | OROMUCOSAL | Status: DC | PRN
  Administered 2014-11-21: 3 mg via ORAL
  Filled 2014-11-21 (×2): qty 9

## 2014-11-21 NOTE — Procedures (Signed)
Extubation Procedure Note  Patient Details:   Name: Jeanne Lawson DOB: December 21, 1945 MRN: [id number]   Airway Documentation:     Evaluation  O2 sats: stable throughout Complications: No apparent complications Patient did tolerate procedure well. Bilateral Breath Sounds: Clear Suctioning: Airway Yes   Patient extubated to 2L nasal cannula per MD order.  Minimal cuff leak noted prior to extubation.  Sats currently 99%.  Vitals are stable.  Patient able to speak post extubation.  No evidence of stridor.  Will continue to monitor.   Alphia Moh N 11/21/2014, 10:19 AM

## 2014-11-21 NOTE — Progress Notes (Signed)
PT Cancellation Note  Patient Details Name: Jeanne Lawson MRN: [id number] DOB: 1945/05/21   Cancelled Treatment:    Reason Eval/Treat Not Completed: Patient not medically ready. Pt continues to be on strict bedrest. Will need updated activity orders once appropriate for mobility and PT.  Joslyn Hy PT, DPT [phone] Pager: [phone] 11/21/2014, 8:45 AM

## 2014-11-21 NOTE — Progress Notes (Signed)
PULMONARY / CRITICAL CARE MEDICINE   Name: Jeanne Lawson MRN: [id number] DOB: [birth year]    ADMISSION DATE:  11/18/2014 CONSULTATION DATE:  10/26  REFERRING MD :  Oneida Alar   CHIEF COMPLAINT:  Vent management   INITIAL PRESENTATION: 69 yo female smoker with hx HTN, COPD, L sided intracranial aneurysm, recent endovascular treatment of R internal carotid artery intracranial aneurysm admitted 10/26 for surgical repair including open access to left CCA then endovascular treatment.   Pt left sedated/intubated post op and PCCM consulted for vent management.   STUDIES:  Soft Tissue U/S 10/27:  Negative for left neck hematoma  SIGNIFICANT EVENTS: 10/26 - OR/IR for repair L ICA/L ACA   SUBJECTIVE: NAD, will extubate.  VITAL SIGNS: Temp:  [97.1 F (36.2 C)-98.1 F (36.7 C)] 97.3 F (36.3 C) (10/29 0802) Pulse Rate:  [44-88] 52 (10/29 0900) Resp:  [16-25] 19 (10/29 0900) BP: (98-171)/(43-80) 138/79 mmHg (10/29 0900) SpO2:  [97 %-100 %] 99 % (10/29 0900) Arterial Line BP: (77-143)/(48-100) 137/72 mmHg (10/29 0800) FiO2 (%):  [40 %] 40 % (10/29 0751) Weight:  [158 lb 1.1 oz (71.7 kg)] 158 lb 1.1 oz (71.7 kg) (10/29 0421) HEMODYNAMICS:   VENTILATOR SETTINGS: Vent Mode:  [-] PSV;CPAP FiO2 (%):  [40 %] 40 % Set Rate:  [18 bmp] 18 bmp Vt Set:  [420 mL] 420 mL PEEP:  [5 cmH20] 5 cmH20 Pressure Support:  [8 cmH20] 8 cmH20 Plateau Pressure:  [15 cmH20-18 cmH20] 16 cmH20 INTAKE / OUTPUT:  Intake/Output Summary (Last 24 hours) at 11/21/14 1010 Last data filed at 11/21/14 0900  Gross per 24 hour  Intake 2050.65 ml  Output   2300 ml  Net -249.35 ml    PHYSICAL EXAMINATION: General:  Awake. No distress off of sedation. Patient sitting up in bed. Integument:  Warm & dry. No rash on exposed skin. Surgical incision over left neck noted is clean and dry. HEENT:  No scleral injection. Endotracheal tube in place. PERRL. Cardiovascular:  Regular rate.  Bilateral lower extremity edema  persists and is unchanged. No appreciable JVD.  Pulmonary:  Coarse breath sounds bilaterally. Symmetric chest rise on ventilator. Abdomen: Soft. Normal bowel sounds. Nondistended.  Neurological: Strength symmetric. Patient following commands bilaterally appropriately. Writing notes.  LABS:  CBC  Recent Labs Lab 11/18/14 1416 11/19/14 0328 11/21/14 0540  WBC 4.2 5.0 4.2  HGB 10.7* 9.8* 10.4*  HCT 31.6* 29.2* 31.1*  PLT 103* 104* 109*   Coag's  Recent Labs Lab 11/18/14 1823  APTT 86*   BMET  Recent Labs Lab 11/19/14 0328 11/20/14 0504 11/21/14 0540  NA 135 142 138  K 3.7 3.4* 3.7  CL 104 108 104  CO2 26 26 25   BUN 11 13 14   CREATININE 0.74 0.70 0.70  GLUCOSE 123* 82 101*   Electrolytes  Recent Labs Lab 11/19/14 0328 11/20/14 0504 11/21/14 0540  CALCIUM 7.6* 8.1* 7.9*  MG  --   --  2.1  PHOS  --   --  4.3   Sepsis Markers  Recent Labs Lab 11/18/14 1416  LATICACIDVEN 1.7  PROCALCITON <0.10   ABG  Recent Labs Lab 11/18/14 1550  PHART 7.430  PCO2ART 37.6  PO2ART 373*   Liver Enzymes  Recent Labs Lab 11/21/14 0540  ALBUMIN 2.8*   Cardiac Enzymes No results for input(s): TROPONINI, PROBNP in the last 168 hours. Glucose No results for input(s): GLUCAP in the last 168 hours.  Imaging No results found.   ASSESSMENT / PLAN:  NEUROLOGIC  A: Left-sided intracranial, supraclinoid intracranial aneurysm - s/p direct access and IR endovascular repair 10/26  P:   ASA 325mg  daily Plavix 75mg  daily RASS goal: 0 to -1 Propofol gtt dc Fentanyl IV prn BP management per Vascular Surgery & Interventional Neurology  PULMONARY OETT 10/26>>>  Ventilator support - post op intracranial aneurysm repair  No cuff leak  P:   Vent support - 8cc/kg  Decadron 2 mg IV every 6 hours, decrease 10/29 Lasix 10 mg IV every 12 hours, dc 10/29 Extubation 10/29  CARDIOVASCULAR A: H/O HTN  H/O CAS  P:  Hold home metoprolol & lisinopril for now ASA  325mg  daily Plavix 75mg  daily BP management per Interventional Radiology & Vascular Surgery   RENAL A: Hypokalemia - Mild. Replacing via tube.   P:   Monitoring UOP  Monitoring Renal Function w/ BUN/Creatinine Monitor electrolytes daily  GASTROINTESTINAL A: No active issue   P:   Protonix IV daily NPO for now  Evaluate swallowing after extubation  HEMATOLOGIC A: Thrombocytopenia - Mild  Anemia - Mild. No active bleeding.  P:  Trending Hgb & Platelets daily w/ CBC SCDs  INFECTIOUS A: No active issue   P:   Monitor for fever or leukocytosis.  ENDOCRINE A: No active issue    P:   Monitor glucose on chemistry panel.  FAMILY  - Updates:  No family available 10/28.  TODAY'S SUMMARY: 69 year old Caucasian female status post aneurysmal repair. Patient has no signs of postoperative hematoma. Meets criteria for extubation.    Richardson Landry Minor ACNP Maryanna Shape PCCM Pager [phone] till 3 pm If no answer page [phone] 11/21/2014, 10:11 AM  Attending Note:  I have examined patient, reviewed labs, studies and notes. I have discussed the case with S Minor, and I agree with the data and plans as amended above. VDRF s/p aneurysmal coiling. Awake and interacting this am, tolerating PSV. Agree with plan to extubate, push pulm hygiene.  Independent critical care time is 40  minutes.   Baltazar Apo, MD, PhD 11/21/2014, 12:00 PM [facility] [phone] or if no answer [phone]

## 2014-11-21 NOTE — Progress Notes (Signed)
Referring Physician(s): Dr. Oneida Alar  Chief Complaint:  Direct L common carotid access Placed 2 pipeline stents across L ICA and Ll ACA jxn-- 11/18/14  Subjective:  Jeanne Lawson is awake and following commands. She is still intubated. She communicates by writing on paper.  She is asking when PT/OT will start. She has no new complaints.  Allergies: Dilaudid; Codeine; Hydromorphone hcl; Latex; and Sulfa drugs cross reactors  Medications: Prior to Admission medications   Medication Sig Start Date End Date Taking? Authorizing Provider  albuterol (PROVENTIL HFA;VENTOLIN HFA) 108 (90 BASE) MCG/ACT inhaler Inhale 1-2 puffs into the lungs every 6 (six) hours as needed for wheezing or shortness of breath.   Yes Historical Provider, MD  aspirin 81 MG tablet Take 81 mg by mouth daily.   Yes Historical Provider, MD  atorvastatin (LIPITOR) 40 MG tablet Take 1 tablet (40 mg total) by mouth daily at 6 PM. 05/27/14  Yes Darlin Coco, MD  clopidogrel (PLAVIX) 75 MG tablet TAKE 1 TABLET BY MOUTH DAILY. 05/27/14  Yes Darlin Coco, MD  lisinopril-hydrochlorothiazide (PRINZIDE,ZESTORETIC) 20-12.5 MG tablet TAKE 1 TABLET BY MOUTH DAILY. 10/23/14  Yes Darlin Coco, MD  TOPROL XL 50 MG 24 hr tablet TAKE 1/2 TABLET BY MOUTH TWICE DAILY. 07/01/14  Yes Darlin Coco, MD     Vital Signs: BP 138/79 mmHg  Pulse 72  Temp(Src) 97.3 F (36.3 C) (Axillary)  Resp 20  Ht 5\' 1"  (1.549 m)  Wt 158 lb 1.1 oz (71.7 kg)  BMI 29.88 kg/m2  SpO2 99%  Physical Exam   *Examined by myself and Dr. [name]*  EYES:  EOM Normal  Neck:  Lt neck site less swollen today Still ecchymotic but resolving No real tenderness to palpate Able to rotate and move neck easily   Musculoskeletal: Normal range of motion. She exhibits edema.  Able to move all 4s to command Sl weaker on left B feet with a little swelling today 2+ pulse B  Neurological: She is alert.  Skin: Skin is warm and dry.  Psychiatric: She  has a normal mood and affect. Her behavior is normal. Thought content normal.  Nursing note and vitals reviewed.   Imaging: US Soft Tissue Head/neck  11/19/2014  CLINICAL DATA:  Recent direct carotid access for intracranial intervention EXAM: ULTRASOUND OF HEAD/NECK SOFT TISSUES TECHNIQUE: Ultrasound examination of the head and neck soft tissues was performed in the area of clinical concern. COMPARISON:  Arteriogram from the previous day FINDINGS: Limited views of the left carotid system are unremarkable. No evidence of hematoma or pseudoaneurysm. This was not a dedicated vascular study. IMPRESSION: Negative for left neck hematoma Electronically Signed   By: Lucrezia Europe M.D.   On: 11/19/2014 10:22   Dg Chest Port 1 View  11/19/2014  CLINICAL DATA:  Respiratory failure EXAM: PORTABLE CHEST 1 VIEW COMPARISON:  Yesterday FINDINGS: Endotracheal and NG tubes are stable. Cardiomegaly. Left basilar patchy density is increased. No pneumothorax. IMPRESSION: Increased atelectasis versus airspace disease at the left base. Electronically Signed   By: Marybelle Killings M.D.   On: 11/19/2014 07:49   Dg Chest Portable 1 View  11/18/2014  CLINICAL DATA:  Hypoxia EXAM: PORTABLE CHEST 1 VIEW COMPARISON:  September 01, 2014 FINDINGS: Endotracheal tube tip is 2.5 cm above the carina. Nasogastric tube tip and side port are in the stomach. No pneumothorax. There is no edema or consolidation. Heart is upper normal in size with pulmonary vascularity within normal limits. No adenopathy. No bone lesions. IMPRESSION: Tube  positions as described without pneumothorax. No edema or consolidation. Electronically Signed   By: Lowella Grip III M.D.   On: 11/18/2014 15:23    Labs:  CBC:  Recent Labs  11/13/14 1006 11/18/14 1416 11/19/14 0328 11/21/14 0540  WBC 5.2 4.2 5.0 4.2  HGB 12.6 10.7* 9.8* 10.4*  HCT 36.7 31.6* 29.2* 31.1*  PLT 124* 103* 104* 109*    COAGS:  Recent Labs  05/12/14 1450 08/07/14 1048  08/12/14 1615 08/12/14 2050 11/13/14 1006 11/18/14 1823  INR 1.02 1.01 1.21  --  1.09  --   APTT  --  29 >200* 34 26 86*    BMP:  Recent Labs  11/18/14 1416 11/19/14 0328 11/20/14 0504 11/21/14 0540  NA 139 135 142 138  K 3.7 3.7 3.4* 3.7  CL 104 104 108 104  CO2 25 26 26 25   GLUCOSE 117* 123* 82 101*  BUN 11 11 13 14   CALCIUM 8.3* 7.6* 8.1* 7.9*  CREATININE 0.63 0.74 0.70 0.70  GFRNONAA >60 >60 >60 >60  GFRAA >60 >60 >60 >60    LIVER FUNCTION TESTS:  Recent Labs  05/03/14 1910 08/07/14 1048 11/13/14 1006 11/21/14 0540  BILITOT 1.1 0.6 0.7  --   AST 29 20 19   --   ALT 20 17 11*  --   ALKPHOS 42 40 41  --   PROT 6.9 6.6 6.6  --   ALBUMIN 3.6 3.5 3.4* 2.8*    Assessment and Plan: Intracranial aneurysm  S/P 2 pipeline flow diverter pipeline stents placed over L ICA/ACA jxn Via direct L common carotid artery access by Dr. [name] on 11/18/2014  Still intubated  Plan is to extubate later today and maybe start PT/OT.  Signed: Murrell Redden PA-C 11/21/2014, 10:52 AM   I spent a total of 15 Minutes at the the patient's bedside AND on the patient's hospital floor or unit, greater than 50% of which was counseling/coordinating care for follow up after treatment of intracranial aneurysm.

## 2014-11-22 ENCOUNTER — Inpatient Hospital Stay (HOSPITAL_COMMUNITY): Payer: Medicare Other

## 2014-11-22 LAB — CBC
HEMATOCRIT: 30.8 % — AB (ref 36.0–46.0)
HEMOGLOBIN: 10.2 g/dL — AB (ref 12.0–15.0)
MCH: 28.1 pg (ref 26.0–34.0)
MCHC: 33.1 g/dL (ref 30.0–36.0)
MCV: 84.8 fL (ref 78.0–100.0)
Platelets: 105 10*3/uL — ABNORMAL LOW (ref 150–400)
RBC: 3.63 MIL/uL — AB (ref 3.87–5.11)
RDW: 14.1 % (ref 11.5–15.5)
WBC: 4.4 10*3/uL (ref 4.0–10.5)

## 2014-11-22 LAB — BASIC METABOLIC PANEL
ANION GAP: 9 (ref 5–15)
BUN: 19 mg/dL (ref 6–20)
CHLORIDE: 104 mmol/L (ref 101–111)
CO2: 25 mmol/L (ref 22–32)
Calcium: 7.9 mg/dL — ABNORMAL LOW (ref 8.9–10.3)
Creatinine, Ser: 0.64 mg/dL (ref 0.44–1.00)
GFR calc Af Amer: >60 mL/min (ref >60)
GFR calc non Af Amer: >60 mL/min (ref >60)
GLUCOSE: 87 mg/dL (ref 65–99)
POTASSIUM: 3.2 mmol/L — AB (ref 3.5–5.1)
Sodium: 138 mmol/L (ref 135–145)

## 2014-11-22 MED ORDER — PANTOPRAZOLE SODIUM 40 MG PO TBEC
40.0000 mg | DELAYED_RELEASE_TABLET | Freq: Every day | ORAL | Status: DC
  Administered 2014-11-22 – 2014-11-24 (×3): 40 mg via ORAL
  Filled 2014-11-22 (×3): qty 1

## 2014-11-22 MED ORDER — POTASSIUM CHLORIDE CRYS ER 20 MEQ PO TBCR
30.0000 meq | EXTENDED_RELEASE_TABLET | ORAL | Status: AC
  Administered 2014-11-22 (×2): 30 meq via ORAL
  Filled 2014-11-22 (×4): qty 1

## 2014-11-22 NOTE — Progress Notes (Signed)
Referring Physician(s): Dr. Oneida Alar  Chief Complaint:  Intracranial Aneurysms   Subjective:  Jeanne Lawson is extubated now and sitting up in a chair eating breakfast She c/o her throat being sore from being intubated. She also c/o some soreness at the left neck incision. She is requesting social work consult for short term rehabilitation.  Allergies: Dilaudid; Codeine; Hydromorphone hcl; Latex; and Sulfa drugs cross reactors  Medications: Prior to Admission medications   Medication Sig Start Date End Date Taking? Authorizing Provider  albuterol (PROVENTIL HFA;VENTOLIN HFA) 108 (90 BASE) MCG/ACT inhaler Inhale 1-2 puffs into the lungs every 6 (six) hours as needed for wheezing or shortness of breath.   Yes Historical Provider, MD  aspirin 81 MG tablet Take 81 mg by mouth daily.   Yes Historical Provider, MD  atorvastatin (LIPITOR) 40 MG tablet Take 1 tablet (40 mg total) by mouth daily at 6 PM. 05/27/14  Yes Darlin Coco, MD  clopidogrel (PLAVIX) 75 MG tablet TAKE 1 TABLET BY MOUTH DAILY. 05/27/14  Yes Darlin Coco, MD  lisinopril-hydrochlorothiazide (PRINZIDE,ZESTORETIC) 20-12.5 MG tablet TAKE 1 TABLET BY MOUTH DAILY. 10/23/14  Yes Darlin Coco, MD  TOPROL XL 50 MG 24 hr tablet TAKE 1/2 TABLET BY MOUTH TWICE DAILY. 07/01/14  Yes Darlin Coco, MD     Vital Signs: BP 142/73 mmHg  Pulse 60  Temp(Src) 98.4 F (36.9 C) (Oral)  Resp 28  Ht 5\' 1"  (1.549 m)  Wt 158 lb 1.1 oz (71.7 kg)  BMI 29.88 kg/m2  SpO2 95%  Physical Exam  Awake and Alert Sitting up in chair. Left neck incision is stable, edema and ecchymosis unchanged. Cranial nerves intact   Imaging: US Soft Tissue Head/neck  11/19/2014  CLINICAL DATA:  Recent direct carotid access for intracranial intervention EXAM: ULTRASOUND OF HEAD/NECK SOFT TISSUES TECHNIQUE: Ultrasound examination of the head and neck soft tissues was performed in the area of clinical concern. COMPARISON:  Arteriogram from the  previous day FINDINGS: Limited views of the left carotid system are unremarkable. No evidence of hematoma or pseudoaneurysm. This was not a dedicated vascular study. IMPRESSION: Negative for left neck hematoma Electronically Signed   By: Lucrezia Europe M.D.   On: 11/19/2014 10:22   Dg Chest Port 1 View  11/22/2014  CLINICAL DATA:  Respiratory failure EXAM: PORTABLE CHEST 1 VIEW COMPARISON:  11/19/14 FINDINGS: Mild cardiac enlargement. There is prominent bilateral pulmonary arteries suggesting PA hypertension. Persistent opacity in the left base may represent atelectasis or infiltrate. IMPRESSION: Persistent left base opacity. Electronically Signed   By: Kerby Moors M.D.   On: 11/22/2014 08:01   Dg Chest Port 1 View  11/19/2014  CLINICAL DATA:  Respiratory failure EXAM: PORTABLE CHEST 1 VIEW COMPARISON:  Yesterday FINDINGS: Endotracheal and NG tubes are stable. Cardiomegaly. Left basilar patchy density is increased. No pneumothorax. IMPRESSION: Increased atelectasis versus airspace disease at the left base. Electronically Signed   By: Marybelle Killings M.D.   On: 11/19/2014 07:49   Dg Chest Portable 1 View  11/18/2014  CLINICAL DATA:  Hypoxia EXAM: PORTABLE CHEST 1 VIEW COMPARISON:  September 01, 2014 FINDINGS: Endotracheal tube tip is 2.5 cm above the carina. Nasogastric tube tip and side port are in the stomach. No pneumothorax. There is no edema or consolidation. Heart is upper normal in size with pulmonary vascularity within normal limits. No adenopathy. No bone lesions. IMPRESSION: Tube positions as described without pneumothorax. No edema or consolidation. Electronically Signed   By: Lowella Grip III M.D.  On: 11/18/2014 15:23    Labs:  CBC:  Recent Labs  11/18/14 1416 11/19/14 0328 11/21/14 0540 11/22/14 0310  WBC 4.2 5.0 4.2 4.4  HGB 10.7* 9.8* 10.4* 10.2*  HCT 31.6* 29.2* 31.1* 30.8*  PLT 103* 104* 109* 105*    COAGS:  Recent Labs  05/12/14 1450 08/07/14 1048 08/12/14 1615  08/12/14 2050 11/13/14 1006 11/18/14 1823  INR 1.02 1.01 1.21  --  1.09  --   APTT  --  29 >200* 34 26 86*    BMP:  Recent Labs  11/19/14 0328 11/20/14 0504 11/21/14 0540 11/22/14 0310  NA 135 142 138 138  K 3.7 3.4* 3.7 3.2*  CL 104 108 104 104  CO2 26 26 25 25   GLUCOSE 123* 82 101* 87  BUN 11 13 14 19   CALCIUM 7.6* 8.1* 7.9* 7.9*  CREATININE 0.74 0.70 0.70 0.64  GFRNONAA >60 >60 >60 >60  GFRAA >60 >60 >60 >60    LIVER FUNCTION TESTS:  Recent Labs  05/03/14 1910 08/07/14 1048 11/13/14 1006 11/21/14 0540  BILITOT 1.1 0.6 0.7  --   AST 29 20 19   --   ALT 20 17 11*  --   ALKPHOS 42 40 41  --   PROT 6.9 6.6 6.6  --   ALBUMIN 3.6 3.5 3.4* 2.8*    Assessment and Plan:  Intracranial aneurysm  S/P 2 pipeline flow diverter pipeline stents placed over L ICA/ACA jxn Via direct L common carotid artery access by Dr. [name] on 11/18/2014  Will place social work request for short term rehab.  D/C when this is arranged.   Signed: Murrell Redden PA-C 11/22/2014, 9:05 AM   I spent a total of 15 Minutes at the the patient's bedside AND on the patient's hospital floor or unit, greater than 50% of which was counseling/coordinating care for f/u after treatment of intracranial aneurysms

## 2014-11-22 NOTE — Evaluation (Signed)
Physical Therapy Evaluation Patient Details Name: Jeanne Lawson MRN: [id number] DOB: [birth year] Today's Date: 11/22/2014   History of Present Illness  Patient is a 69 y.o. year old female who presents for evaluation of her carotid arteries. She is referred by Dr. [name] for consideration of open exposure of her common carotid artery in order to treat intracranial aneurysms with coiling/stenting.in the near future  Clinical Impression  Pt presenting with L UE weakness, impaired balance and increased falls risk. Pt was indep and using a cane PTA, living alone. Pt currently unsafe to return home alone at this time, pt agrees. Recommend ST-SNF to achieve safe mod I level of function for safe transition home.    Follow Up Recommendations SNF;Supervision/Assistance - 24 hour    Equipment Recommendations  Rolling walker with 5" wheels    Recommendations for Other Services       Precautions / Restrictions Precautions Precautions: Fall      Mobility  Bed Mobility Overal bed mobility: Needs Assistance Bed Mobility: Sit to Supine       Sit to supine: Min guard   General bed mobility comments: pt able to bring legs back up into bed  Transfers Overall transfer level: Needs assistance Equipment used: None Transfers: Sit to/from Stand Sit to Stand: Min guard         General transfer comment: minA from low height, v/c's to pull up on bar in bathroom and push up from chair arm rests, assist for initial power up  Ambulation/Gait Ambulation/Gait assistance: Min guard;Min assist Ambulation Distance (Feet): 120 Feet Assistive device: Rolling walker (2 wheeled);Straight cane Gait Pattern/deviations: Step-through pattern;Decreased stride length Gait velocity: decr Gait velocity interpretation: Below normal speed for age/gender General Gait Details: pt started ambulating with SPC but wanted to hold onto PT with other hand requiring minA for safe amb. Pt transitioned to RW pt  improved to min guard with v/c's for safe walker mangement  Stairs            Wheelchair Mobility    Modified Rankin (Stroke Patients Only) Modified Rankin (Stroke Patients Only) Pre-Morbid Rankin Score: Slight disability Modified Rankin: Moderate disability     Balance Overall balance assessment: Needs assistance         Standing balance support: No upper extremity supported;During functional activity Standing balance-Leahy Scale: Fair Standing balance comment: pt able to stand at sink and wash hands without LOB balance                             Pertinent Vitals/Pain Pain Assessment: No/denies pain    Home Living Family/patient expects to be discharged to:: Skilled nursing facility Living Arrangements: Alone               Additional Comments: pt lives alone but has already arranged for herself to go to [facility] for rehab (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 and has gone to [facility] for rehab after all of them"    Prior Function Level of Independence: Independent with assistive device(s)         Comments: uses single point cane and furniture surfs     Hand Dominance   Dominant Hand: Right    Extremity/Trunk Assessment   Upper Extremity Assessment: LUE deficits/detail       LUE Deficits / Details: grossly 4/5   Lower Extremity Assessment: LLE deficits/detail   LLE Deficits / Details: grossly 4/5  Cervical / Trunk Assessment: Kyphotic  Communication   Communication:  No difficulties  Cognition Arousal/Alertness: Awake/alert Behavior During Therapy: WFL for tasks assessed/performed Overall Cognitive Status: Within Functional Limits for tasks assessed                      General Comments      Exercises        Assessment/Plan    PT Assessment Patient needs continued PT services  PT Diagnosis Difficulty walking;Generalized weakness   PT Problem List Decreased strength;Decreased activity tolerance;Decreased balance;Decreased  mobility;Decreased knowledge of use of DME  PT Treatment Interventions DME instruction;Gait training;Functional mobility training;Therapeutic activities;Therapeutic exercise;Balance training;Neuromuscular re-education   PT Goals (Current goals can be found in the Care Plan section) Acute Rehab PT Goals Patient Stated Goal: [facility] for rehab PT Goal Formulation: With patient Time For Goal Achievement: 11/29/14 Potential to Achieve Goals: Good    Frequency Min 4X/week   Barriers to discharge Decreased caregiver support alone    Co-evaluation               End of Session Equipment Utilized During Treatment: Gait belt Activity Tolerance: Patient tolerated treatment well Patient left: in bed;with call bell/phone within reach Nurse Communication: Mobility status         Time: 1030-1057 PT Time Calculation (min) (ACUTE ONLY): 27 min   Charges:   PT Evaluation $Initial PT Evaluation Tier I: 1 Procedure PT Treatments $Gait Training: 8-22 mins   PT G CodesKingsley Callander 11/22/2014, 11:31 AM   [name], PT, DPT Pager #: [phone] Office #: [phone]

## 2014-11-22 NOTE — Progress Notes (Signed)
[facility] ADULT ICU REPLACEMENT PROTOCOL FOR AM LAB REPLACEMENT ONLY  The patient does apply for the [facility] Adult ICU Electrolyte Replacment Protocol based on the criteria listed below:   1. Is GFR >/= 40 ml/min? Yes.    Patient's GFR today is >60 2. Is urine output >/= 0.5 ml/kg/hr for the last 6 hours? Yes.   Patient's UOP is 0.6 ml/kg/hr 3. Is BUN < 60 mg/dL? Yes.    Patient's BUN today is 19 4. Abnormal electrolyte(s):K 3.25. Ordered repletion with: per protocol 6. If a panic level lab has been reported, has the CCM MD in charge been notified? No..   Physician:    Ronda Fairly A 11/22/2014 5:09 AM

## 2014-11-22 NOTE — Progress Notes (Signed)
PULMONARY / CRITICAL CARE MEDICINE   Name: Jeanne Lawson MRN: [id number] DOB: 13-Jul-1945    ADMISSION DATE:  11/18/2014 CONSULTATION DATE:  10/26  REFERRING MD :  Oneida Alar   CHIEF COMPLAINT:  Vent management   INITIAL PRESENTATION: 69 yo female smoker with hx HTN, COPD, L sided intracranial aneurysm, recent endovascular treatment of R internal carotid artery intracranial aneurysm admitted 10/26 for surgical repair including open access to left CCA then endovascular treatment.   Pt left sedated/intubated post op and PCCM consulted for vent management.   STUDIES:  Soft Tissue U/S 10/27:  Negative for left neck hematoma  SIGNIFICANT EVENTS: 10/26 - OR/IR for repair L ICA/L ACA   SUBJECTIVE: NAD, will extubate.  VITAL SIGNS: Temp:  [97.7 F (36.5 C)-99 F (37.2 C)] 98.4 F (36.9 C) (10/30 0739) Pulse Rate:  [56-86] 86 (10/30 0900) Resp:  [18-29] 21 (10/30 0900) BP: (118-172)/(49-95) 152/86 mmHg (10/30 0900) SpO2:  [92 %-100 %] 96 % (10/30 0900) Arterial Line BP: (131-157)/(75-94) 153/75 mmHg (10/29 1300) Weight:  [158 lb 1.1 oz (71.7 kg)] 158 lb 1.1 oz (71.7 kg) (10/30 0500) HEMODYNAMICS:   VENTILATOR SETTINGS:   INTAKE / OUTPUT:  Intake/Output Summary (Last 24 hours) at 11/22/14 0942 Last data filed at 11/22/14 0800  Gross per 24 hour  Intake 1213.05 ml  Output    680 ml  Net 533.05 ml    PHYSICAL EXAMINATION: General:  Awake. No distress off of sedation. Patient sitting up in bed. Integument:  Warm & dry. No rash on exposed skin. Surgical incision over left neck noted is clean and dry. HEENT:  No scleral injection. Endotracheal tube in place. PERRL. Cardiovascular:  Regular rate.  Bilateral lower extremity edema persists and is unchanged. No appreciable JVD.  Pulmonary:  Coarse breath sounds bilaterally. Symmetric chest rise on ventilator. Abdomen: Soft. Normal bowel sounds. Nondistended.  Neurological: Strength symmetric. Patient following commands bilaterally  appropriately. Writing notes.  LABS:  CBC  Recent Labs Lab 11/19/14 0328 11/21/14 0540 11/22/14 0310  WBC 5.0 4.2 4.4  HGB 9.8* 10.4* 10.2*  HCT 29.2* 31.1* 30.8*  PLT 104* 109* 105*   Coag's  Recent Labs Lab 11/18/14 1823  APTT 86*   BMET  Recent Labs Lab 11/20/14 0504 11/21/14 0540 11/22/14 0310  NA 142 138 138  K 3.4* 3.7 3.2*  CL 108 104 104  CO2 26 25 25   BUN 13 14 19   CREATININE 0.70 0.70 0.64  GLUCOSE 82 101* 87   Electrolytes  Recent Labs Lab 11/20/14 0504 11/21/14 0540 11/22/14 0310  CALCIUM 8.1* 7.9* 7.9*  MG  --  2.1  --   PHOS  --  4.3  --    Sepsis Markers  Recent Labs Lab 11/18/14 1416  LATICACIDVEN 1.7  PROCALCITON <0.10   ABG  Recent Labs Lab 11/18/14 1550  PHART 7.430  PCO2ART 37.6  PO2ART 373*   Liver Enzymes  Recent Labs Lab 11/21/14 0540  ALBUMIN 2.8*   Cardiac Enzymes No results for input(s): TROPONINI, PROBNP in the last 168 hours. Glucose No results for input(s): GLUCAP in the last 168 hours.  Imaging Dg Chest Port 1 View  11/22/2014  CLINICAL DATA:  Respiratory failure EXAM: PORTABLE CHEST 1 VIEW COMPARISON:  11/19/14 FINDINGS: Mild cardiac enlargement. There is prominent bilateral pulmonary arteries suggesting PA hypertension. Persistent opacity in the left base may represent atelectasis or infiltrate. IMPRESSION: Persistent left base opacity. Electronically Signed   By: Kerby Moors M.D.   On: 11/22/2014  08:01     ASSESSMENT / PLAN:  NEUROLOGIC A: Left-sided intracranial, supraclinoid intracranial aneurysm - s/p direct access and IR endovascular repair 10/26  P:   ASA 325mg  daily Plavix 75mg  daily RASS goal: 0 Propofol gtt off Fentanyl IV prn BP management per Vascular Surgery & Interventional Neurology  PULMONARY OETT 10/26>>>  Ventilator support - post op intracranial aneurysm repair  No cuff leak P:   pulm hygiene   CARDIOVASCULAR A: H/O HTN  H/O CAS  P:  Hold home  metoprolol & lisinopril for now ASA 325mg  daily Plavix 75mg  daily BP management per Interventional Radiology & Vascular Surgery   RENAL A: Hypokalemia -   P:   Monitoring UOP  Monitoring Renal Function w/ BUN/Creatinine Monitor electrolytes daily  GASTROINTESTINAL A: No active issue   P:   Protonix   HEMATOLOGIC A: Thrombocytopenia - Mild  Anemia - Mild. No active bleeding.  P:  Trending Hgb & Platelets daily w/ CBC SCDs  INFECTIOUS A: No active issue   P:   Monitor for fever or leukocytosis.  ENDOCRINE A: No active issue    P:   Monitor glucose on chemistry panel.  FAMILY  - Updates:  No family available updated 10/29  TODAY'S SUMMARY: 69 year old Caucasian female status post aneurysmal repair. Patient has no signs of postoperative hematoma. Extubated, sitting in chair. Nad. PCCM will sign off. Going to SNF 10/31   [facility] Minor ACNP Maryanna Shape PCCM Pager [phone] till 3 pm If no answer page [phone] 11/22/2014, 9:42 AM  Attending Note:  I have examined patient, reviewed labs, studies and notes. I have discussed the case with S Minor, and I agree with the data and plans as amended above. Looks like she can transfer out of ICU. Please call if we can assist further.  Baltazar Apo, MD, PhD 11/22/2014, 12:28 PM [facility] [phone] or if no answer [phone]

## 2014-11-23 LAB — PLATELET INHIBITION P2Y12: Platelet Function  P2Y12: 85 [PRU] — ABNORMAL LOW (ref 194–418)

## 2014-11-23 LAB — BASIC METABOLIC PANEL
Anion gap: 6 (ref 5–15)
BUN: 12 mg/dL (ref 6–20)
CALCIUM: 7.9 mg/dL — AB (ref 8.9–10.3)
CO2: 25 mmol/L (ref 22–32)
CREATININE: 0.61 mg/dL (ref 0.44–1.00)
Chloride: 108 mmol/L (ref 101–111)
GFR calc non Af Amer: >60 mL/min (ref >60)
Glucose, Bld: 95 mg/dL (ref 65–99)
Potassium: 3.7 mmol/L (ref 3.5–5.1)
Sodium: 139 mmol/L (ref 135–145)

## 2014-11-23 MED ORDER — LISINOPRIL 20 MG PO TABS
20.0000 mg | ORAL_TABLET | Freq: Every day | ORAL | Status: DC
  Administered 2014-11-24: 20 mg via ORAL
  Filled 2014-11-23 (×2): qty 1

## 2014-11-23 MED ORDER — METOPROLOL SUCCINATE ER 25 MG PO TB24
50.0000 mg | ORAL_TABLET | Freq: Every day | ORAL | Status: DC
  Administered 2014-11-23: 50 mg via ORAL
  Filled 2014-11-23: qty 2

## 2014-11-23 MED ORDER — HYDROCHLOROTHIAZIDE 12.5 MG PO CAPS
12.5000 mg | ORAL_CAPSULE | Freq: Every day | ORAL | Status: DC
  Administered 2014-11-23 – 2014-11-24 (×2): 12.5 mg via ORAL
  Filled 2014-11-23 (×2): qty 1

## 2014-11-23 NOTE — Clinical Social Work Placement (Signed)
   CLINICAL SOCIAL WORK PLACEMENT  NOTE  Date:  11/23/2014  Patient Details  Name: Jeanne Lawson MRN: [id number] Date of Birth: 02/12/1945  Clinical Social Work is seeking post-discharge placement for this patient at the [place] level of care (*CSW will initial, date and re-position this form in  chart as items are completed):  Yes   Patient/family provided with [place] Work Department's list of facilities offering this level of care within the geographic area requested by the patient (or if unable, by the patient's family).  Yes   Patient/family informed of their freedom to choose among providers that offer the needed level of care, that participate in Medicare, Medicaid or managed care program needed by the patient, have an available bed and are willing to accept the patient.  Yes   Patient/family informed of [facility]'s ownership interest in [facility] and [facility], as well as of the fact that they are under no obligation to receive care at these facilities.  PASRR submitted to EDS on 11/23/14     PASRR number received on 11/23/14     Existing PASRR number confirmed on       FL2 transmitted to all facilities in geographic area requested by pt/family on 11/23/14     FL2 transmitted to all facilities within larger geographic area on       Patient informed that his/her managed care company has contracts with or will negotiate with certain facilities, including the following:        Yes   Patient/family informed of bed offers received.  Patient chooses bed at [place], [place]     Physician recommends and patient chooses bed at      Patient to be transferred to [place] on 11/24/14.  Patient to be transferred to facility by [facility]     Patient family notified on 11/23/14 of transfer.  Name of family member notified:  Gevena Mart (Patient daughter)     PHYSICIAN Please sign FL2     Additional  Comment:    Barbette Or, [place]

## 2014-11-23 NOTE — Progress Notes (Signed)
Bp elevated. PA notified.

## 2014-11-23 NOTE — NC FL2 (Signed)
[place] LEVEL OF CARE SCREENING TOOL     IDENTIFICATION  Patient Name: Jeanne Lawson Birthdate: 10-31-1945 Sex: female Admission Date (Current Location): 11/18/2014  [facility] and [place] Number: Herbalist and Address:  The [place], [place] [address], [place], [place] [address]      Provider Number: [id number]  Attending Physician Name and Address:  [name], MD  Relative Name and Phone Number:       Current Level of Care: Hospital Recommended Level of Care: [place] Prior Approval Number:    Date Approved/Denied:   PASRR Number:    Discharge Plan: SNF    Current Diagnoses: Patient Active Problem List   Diagnosis Date Noted  . Hypokalemia   . Endotracheally intubated   . Essential hypertension   . Intracranial aneurysm 11/18/2014  . Respiratory failure ([place])   . Cerebral aneurysm   . Brain aneurysm   . Aphasia   . Palmar erythema   . CVA (cerebral infarction) 05/02/2014  . Aneurysm ([place]) 05/02/2014  . Expressive aphasia 05/02/2014  . 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 Tobacco abuse 10/24/2010  . TIA (transient ischemic attack)   . Hypertension   . Ventricular hypertrophy   . CVA 02/23/2010  . STRESS FRACTURE, FOOT 02/22/2010    Orientation ACTIVITIES/SOCIAL BLADDER RESPIRATION    Self, Time, Situation, Place  Passive Continent Normal  BEHAVIORAL SYMPTOMS/MOOD NEUROLOGICAL BOWEL NUTRITION STATUS      Continent Diet  PHYSICIAN VISITS COMMUNICATION OF NEEDS Height & Weight Skin    Verbally 5\' 1"  (154.9 cm) 152 lbs. Surgical wounds (Left Neck)          AMBULATORY STATUS RESPIRATION    Supervision limited Normal      Personal Care Assistance Level of Assistance  Bathing, Feeding, Dressing Bathing Assistance: Limited assistance Feeding assistance: Limited assistance Dressing Assistance: Limited assistance      Functional  Limitations [place]  PT (By licensed PT), OT (By licensed OT)     PT Frequency: 3 OT Frequency: 3           Additional Factors Info  Code Status, Allergies Code Status Info: Full Allergies Info: Dilaudid, Codeine, Hydromorphone Hcl, Latex, Sulfa Drugs Cross Reactors           Current Medications (11/23/2014): Current Facility-Administered Medications  Medication Dose Route Frequency Provider Last Rate Last Dose  . 0.9 %  sodium chloride infusion  250 mL Intravenous PRN Marijean Heath, NP      . acetaminophen (TYLENOL) tablet 1,000 mg  1,000 mg Oral Q6H PRN [name], MD       Or  . acetaminophen (TYLENOL) suppository 650 mg  650 mg Rectal Q6H PRN [name], MD      . aspirin tablet 325 mg  325 mg Oral Q breakfast [name], MD   325 mg at 11/23/14 0805  . clopidogrel (PLAVIX) tablet 75 mg  75 mg Oral Q breakfast [name], MD   75 mg at 11/23/14 0805  . diphenhydrAMINE (BENADRYL) 12.5 MG/5ML elixir 25 mg  25 mg Per Tube Q6H PRN Javier Glazier, MD      . fentaNYL (SUBLIMAZE) injection 50 mcg  50 mcg Intravenous Q15 min PRN Marijean Heath, NP   50 mcg at 11/19/14 0101  . fentaNYL (SUBLIMAZE) injection  50 mcg  50 mcg Intravenous Q2H PRN Marijean Heath, NP   50 mcg at 11/23/14 7482  . hydrochlorothiazide (MICROZIDE) capsule 12.5 mg  12.5 mg Oral Daily Monia Sabal, PA-C   12.5 mg at 11/23/14 1007  . Influenza vac split quadrivalent PF (FLUARIX) injection 0.5 mL  0.5 mL Intramuscular Tomorrow-1000 [name], MD   0.5 mL at 11/19/14 1212  . lisinopril (PRINIVIL,ZESTRIL) tablet 20 mg  20 mg Oral Daily Monia Sabal, PA-C   Stopped at 11/23/14 1007  . menthol-cetylpyridinium (CEPACOL) lozenge 3 mg  1 lozenge Oral PRN Grace Bushy Minor, NP   3 mg at 11/21/14 1859  . metoprolol succinate (TOPROL-XL) 24 hr tablet 50 mg  50 mg Oral Daily Monia Sabal, PA-C   50 mg at 11/23/14 1006  .  nicardipine (CARDENE) 20mg  in 0.86% saline 295ml IV infusion (0.1 mg/ml)  5-15 mg/hr Intravenous Continuous [name], MD      . ondansetron (ZOFRAN) injection 4 mg  4 mg Intravenous Q6H PRN [name], MD      . pantoprazole (PROTONIX) EC tablet 40 mg  40 mg Oral Q1200 William S Minor, NP   40 mg at 11/22/14 1754   Do not use this list as official medication orders. Please verify with discharge summary.  Discharge Medications:   Medication List    ASK your doctor about these medications        albuterol 108 (90 BASE) MCG/ACT inhaler  Commonly known as:  PROVENTIL HFA;VENTOLIN HFA  Inhale 1-2 puffs into the lungs every 6 (six) hours as needed for wheezing or shortness of breath.     aspirin 81 MG tablet  Take 81 mg by mouth daily.     atorvastatin 40 MG tablet  Commonly known as:  LIPITOR  Take 1 tablet (40 mg total) by mouth daily at 6 PM.     clopidogrel 75 MG tablet  Commonly known as:  PLAVIX  TAKE 1 TABLET BY MOUTH DAILY.     lisinopril-hydrochlorothiazide 20-12.5 MG tablet  Commonly known as:  PRINZIDE,ZESTORETIC  TAKE 1 TABLET BY MOUTH DAILY.     TOPROL XL 50 MG 24 hr tablet  Generic drug:  metoprolol succinate  TAKE 1/2 TABLET BY MOUTH TWICE DAILY.        Relevant Imaging Results:  Relevant Lab Results:  Recent Labs    Additional Information    Alanys Godino, Francetta Found, LCSW

## 2014-11-23 NOTE — Care Management Important Message (Signed)
Important Message  Patient Details  Name: Jeanne Lawson MRN: [id number] Date of Birth: [birth year]   Medicare Important Message Given:  Yes-second notification given    Nathen May 11/23/2014, 5:30 PM

## 2014-11-23 NOTE — Evaluation (Signed)
Occupational Therapy Evaluation Patient Details Name: Jeanne Lawson MRN: [id number] DOB: 20-Nov-1945 Today's Date: 11/23/2014    History of Present Illness Patient is a 69 y.o. year old female who presents for evaluation of her carotid arteries. She is referred by Dr. [name] for consideration of open exposure of her common carotid artery in order to treat intracranial aneurysms with coiling/stenting.in the near future   Clinical Impression   Pt was functioning at a modified independent level in her home prior to admission.  She presents with generalized weakness, impaired balance and some inattention and impaired safety. Pt lives alone and will required ST rehab in SNF prior to return home.  Will follow acutely.    Follow Up Recommendations  SNF;Supervision/Assistance - 24 hour    Equipment Recommendations       Recommendations for Other Services       Precautions / Restrictions Precautions Precautions: Fall Restrictions Weight Bearing Restrictions: No      Mobility Bed Mobility      General bed mobility comments: pt received while ambulating with PT in hall  Transfers Overall transfer level: Needs assistance Equipment used: Rolling walker (2 wheeled) Transfers: Sit to/from Stand Sit to Stand: Min guard         General transfer comment: cues and assist to align RW with sitting surface    Balance Overall balance assessment: Needs assistance   Sitting balance-Leahy Scale: Good     Standing balance support: No upper extremity supported;During functional activity Standing balance-Leahy Scale: Fair                              ADL Overall ADL's : Needs assistance/impaired Eating/Feeding: Independent;Sitting   Grooming: Wash/dry hands;Supervision/safety;Standing Grooming Details (indicate cue type and reason): cues to use soap Upper Body Bathing: Set up;Sitting   Lower Body Bathing: Sit to/from stand;Minimal assistance   Upper Body  Dressing : Minimal assistance;Standing Upper Body Dressing Details (indicate cue type and reason): doff bathrobe Lower Body Dressing: Sit to/from stand;Minimal assistance   Toilet Transfer: Ambulation;RW;Minimal assistance Toilet Transfer Details (indicate cue type and reason): assist to manage RW in bathroom Toileting- Clothing Manipulation and Hygiene: Minimal assistance;Sit to/from stand         General ADL Comments: pt with tendency to release walker as she approaches bathroom and sink     Vision     Perception     Praxis      Pertinent Vitals/Pain Pain Assessment: No/denies pain     Hand Dominance Right   Extremity/Trunk Assessment Upper Extremity Assessment Upper Extremity Assessment: Overall WFL for tasks assessed (grossly 4/5)   Lower Extremity Assessment Lower Extremity Assessment: Defer to PT evaluation       Communication Communication Communication: No difficulties   Cognition Arousal/Alertness: Awake/alert Behavior During Therapy: WFL for tasks assessed/performed Overall Cognitive Status: No family/caregiver present to determine baseline cognitive functioning (pt with some confusion, impaired attention)                     General Comments       Exercises       Shoulder Instructions      Home Living Family/patient expects to be discharged to:: Skilled nursing facility Living Arrangements: Alone                               Additional Comments: Pt has arranged to  go to rehab at [facility].      Prior Functioning/Environment Level of Independence: Independent with assistive device(s)        Comments: uses single point cane and furniture for ambulation    OT Diagnosis: Generalized weakness;Cognitive deficits   OT Problem List: Decreased strength;Decreased activity tolerance;Impaired balance (sitting and/or standing);Decreased cognition;Decreased safety awareness;Decreased knowledge of use of DME or AE   OT  Treatment/Interventions: Self-care/ADL training;DME and/or AE instruction;Therapeutic activities;Cognitive remediation/compensation;Patient/family education;Balance training    OT Goals(Current goals can be found in the care plan section) Acute Rehab OT Goals Patient Stated Goal: [facility] for rehab OT Goal Formulation: With patient Time For Goal Achievement: 11/30/14 Potential to Achieve Goals: Good ADL Goals Pt Will Perform Grooming: with supervision;standing Pt Will Perform Lower Body Bathing: with supervision;sit to/from stand Pt Will Perform Upper Body Dressing: with supervision;sitting Pt Will Perform Lower Body Dressing: with supervision;sit to/from stand Pt Will Transfer to Toilet: with supervision;ambulating;bedside commode (over toilet) Pt Will Perform Toileting - Clothing Manipulation and hygiene: with supervision;sit to/from stand Additional ADL Goal #1: Pt will gather ADL from around her room with RW and supervision.  OT Frequency: Min 2X/week   Barriers to D/C:            Co-evaluation              End of Session Equipment Utilized During Treatment: Rolling walker  Activity Tolerance: Patient tolerated treatment well Patient left: in chair;with call bell/phone within reach;with chair alarm set   Time: 1119-1140 OT Time Calculation (min): 21 min Charges:  OT General Charges $OT Visit: 1 Procedure OT Evaluation $Initial OT Evaluation Tier I: 1 Procedure G-Codes:    Malka So 11/23/2014, 12:54 PM  [phone]

## 2014-11-23 NOTE — Progress Notes (Signed)
Nutrition Follow-up   INTERVENTION:   Recommend consider swallow evaluation.   Magic cup TID with meals, each supplement provides 290 kcal and 9 grams of protein  NUTRITION DIAGNOSIS:   Inadequate oral intake related to  (throat pain) as evidenced by meal completion < 50%. Ongoing.   GOAL:   Patient will meet greater than or equal to 90% of their needs Not yet met.   MONITOR:   PO intake, Labs, Weight trends, I & O's  ASSESSMENT:   69 yo female smoker with hx HTN, COPD, L sided intracranial aneurysm, recent endovascular treatment of R internal carotid artery intracranial aneurysm admitted 10/26 for surgical repair including open access to left CCA then endovascular treatment.  Pt has now had 4 surgeries on her neck. She reports that PTA MD was encouraging her to not eat meat, chips. She was eating soft foods. Since her recent surgery she has been ordering soft foods. She reports coughing while eating and drinking. Discussed with RN, paged PA but no call back.  Pt reports after her first surgery she lost 5 lbs which she regained. She purposely gained 5 lb prior to this surgery in anticipation of possible weight loss.  Pt plans to go to SNF once bed available.    Diet Order:  Diet regular Room service appropriate?: Yes; Fluid consistency:: Thin  Skin:  Reviewed, no issues  Last BM:  10/30  Height:   Ht Readings from Last 1 Encounters:  11/18/14 '5\' 1"'  (1.549 m)   Weight:   Wt Readings from Last 1 Encounters:  11/23/14 152 lb 12.5 oz (69.3 kg)   Ideal Body Weight:  47.7 kg  BMI:  Body mass index is 28.88 kg/(m^2).  Estimated Nutritional Needs:   Kcal:  1600-1800  Protein:  75-85 grams  Fluid:  > 1.6 L/day  EDUCATION NEEDS:   No education needs identified at this time  [place], [place], [place] Pager [phone] After Hours Pager

## 2014-11-23 NOTE — Clinical Social Work Note (Signed)
Clinical Social Work Assessment  Patient Details  Name: Jeanne Lawson MRN: [id number] Date of Birth: Oct 23, 1945  Date of referral:  11/23/14               Reason for consult:  Facility Placement                Permission sought to share information with:  Family Supports Permission granted to share information::  Yes, Verbal Permission Granted  Name::     Gevena Mart  Relationship::  Daughter  Contact Information:  [phone]  Housing/Transportation Living arrangements for the past 2 months:  [place] of Information:  Patient, Adult Children Patient Interpreter Needed:  None Criminal Activity/Legal Involvement Pertinent to Current Situation/Hospitalization:  No - Comment as needed Significant Relationships:  Adult Children Lives with:  Self Do you feel safe going back to the place where you live?  Yes Need for family participation in patient care:  Yes (Comment)  Care giving concerns:  Patient daughter states that patient has been to [facility] in the past and would like to return.  Patient daughter requesting private home at the facility.   Social Worker assessment / plan:  Holiday representative met with patient and patient daughter at bedside to offer support and discuss patient needs at discharge.  Patient states that she has been to [facility] in the past and would like to return.  Patient currently living alone and needs additional supervision support.  CSW initiated referral to [facility] and facility agreeable with patient discharge tomorrow (11/01).  CSW provided information to patient and patient daughter.  Patient daughter plans to provide transportation tomorrow afternoon.  CSW remains available for support and to facilitate patient discharge needs once medically stable.  Employment status:  Retired Nurse, adult PT Recommendations:  Industry / Referral to community resources:  [place]  Patient/Family's Response to care:  Patient and patient daughter agreeable with discharge to [facility] and appreciative of CSW support and involvement.  Patient plans to discharge in the afternoon to accommodate her ride.  Patient/Family's Understanding of and Emotional Response to Diagnosis, Current Treatment, and Prognosis:  Patient and patient family well aware of patient needs and continued medical concerns.  Patient and family realistic of patient goals and ability to return home following SNF stay.  Emotional Assessment Appearance:  Appears stated age Attitude/Demeanor/Rapport:   (Appropriate and Cooperative) Affect (typically observed):  Accepting, Appropriate, Calm, Happy Orientation:  Oriented to Self, Oriented to Place, Oriented to  Time, Oriented to Situation Alcohol / Substance use:  Not Applicable Psych involvement (Current and /or in the community):  No (Comment)  Discharge Needs  Concerns to be addressed:  Care Coordination Readmission within the last 30 days:  Yes Current discharge risk:  Lack of support system Barriers to Discharge:  Continued Medical Work up  [employer], [place]

## 2014-11-23 NOTE — Progress Notes (Signed)
Physical Therapy Treatment Patient Details Name: CLORISSA GRUENBERG MRN: [id number] DOB: Nov 12, 1945 Today's Date: 11/23/2014    History of Present Illness Patient is a 69 y.o. year old female who presents for evaluation of her carotid arteries. She is referred by Dr. [name] for consideration of open exposure of her common carotid artery in order to treat intracranial aneurysms with coiling/stenting.in the near future    PT Comments    Pt continues to be mildly unsteady with mobility and requires RW for safety.  Pt with a little trouble staying on task and recalling information given to her at beginning of session.  Feel pt would benefit from SNF at D/C.    Follow Up Recommendations  SNF     Equipment Recommendations  Rolling walker with 5" wheels    Recommendations for Other Services       Precautions / Restrictions Precautions Precautions: Fall Restrictions Weight Bearing Restrictions: No    Mobility  Bed Mobility Overal bed mobility: Needs Assistance Bed Mobility: Supine to Sit     Supine to sit: Min guard     General bed mobility comments: pt impulsive and trying to come to sitting EOB with bed rails up and lines around bed rails.    Transfers Overall transfer level: Needs assistance Equipment used: Rolling walker (2 wheeled) Transfers: Sit to/from Stand Sit to Stand: Min guard         General transfer comment: cues for UE use and to attend to task as pt tends to start coming to stand without having RW or lines.    Ambulation/Gait Ambulation/Gait assistance: Min guard Ambulation Distance (Feet): 150 Feet Assistive device: Rolling walker (2 wheeled) Gait Pattern/deviations: Step-through pattern;Decreased stride length     General Gait Details: pt unsteady and does benefit from having Bil UE support on RW.  pt gets distracted easily and needs cues to attend to task.     Stairs            Wheelchair Mobility    Modified Rankin (Stroke Patients  Only) Modified Rankin (Stroke Patients Only) Pre-Morbid Rankin Score: Slight disability Modified Rankin: Moderate disability     Balance Overall balance assessment: Needs assistance         Standing balance support: No upper extremity supported;During functional activity Standing balance-Leahy Scale: Fair                      Cognition Arousal/Alertness: Awake/alert Behavior During Therapy: WFL for tasks assessed/performed Overall Cognitive Status: No family/caregiver present to determine baseline cognitive functioning (Some memory trouble and attending to task.)                      Exercises      General Comments        Pertinent Vitals/Pain Pain Assessment: No/denies pain    Home Living                      Prior Function            PT Goals (current goals can now be found in the care plan section) Acute Rehab PT Goals Patient Stated Goal: [facility] for rehab PT Goal Formulation: With patient Time For Goal Achievement: 11/29/14 Potential to Achieve Goals: Good Progress towards PT goals: Progressing toward goals    Frequency  Min 3X/week    PT Plan Frequency needs to be updated    Co-evaluation  End of Session Equipment Utilized During Treatment: Gait belt Activity Tolerance: Patient tolerated treatment well Patient left:  (up with OT)     Time: 6734-1937 PT Time Calculation (min) (ACUTE ONLY): 25 min  Charges:  $Gait Training: 23-37 mins                    G CodesCatarina Hartshorn, [place] 11/23/2014, 12:32 PM

## 2014-11-23 NOTE — Progress Notes (Signed)
Referring Physician(s):  Dr Oneida Alar  Chief Complaint:  L supraclinoid aneurysm pipeline stent x2 placed 11/18/14 Direct access to L common carotid  Subjective:  Doing well Extubated 10/29 Up in bed Minimal pain still at L neck site Speech and vision clear Pt has been evaluated with PT Rec: continued treatment Plan to dc to SNF - [facility]- when bed available  Urinating well; has had BMs Noted some bleeding on tissue this am after bathroom use Denies urinary sxs; denies pain with defecation    Allergies: Dilaudid; Codeine; Hydromorphone hcl; Latex; and Sulfa drugs cross reactors  Medications: Prior to Admission medications   Medication Sig Start Date End Date Taking? Authorizing Provider  albuterol (PROVENTIL HFA;VENTOLIN HFA) 108 (90 BASE) MCG/ACT inhaler Inhale 1-2 puffs into the lungs every 6 (six) hours as needed for wheezing or shortness of breath.   Yes Historical Provider, MD  aspirin 81 MG tablet Take 81 mg by mouth daily.   Yes Historical Provider, MD  atorvastatin (LIPITOR) 40 MG tablet Take 1 tablet (40 mg total) by mouth daily at 6 PM. 05/27/14  Yes Darlin Coco, MD  clopidogrel (PLAVIX) 75 MG tablet TAKE 1 TABLET BY MOUTH DAILY. 05/27/14  Yes Darlin Coco, MD  lisinopril-hydrochlorothiazide (PRINZIDE,ZESTORETIC) 20-12.5 MG tablet TAKE 1 TABLET BY MOUTH DAILY. 10/23/14  Yes Darlin Coco, MD  TOPROL XL 50 MG 24 hr tablet TAKE 1/2 TABLET BY MOUTH TWICE DAILY. 07/01/14  Yes Darlin Coco, MD     Vital Signs: BP 183/106 mmHg  Pulse 69  Temp(Src) 98.3 F (36.8 C) (Oral)  Resp 24  Ht 5\' 1"  (1.549 m)  Wt 152 lb 12.5 oz (69.3 kg)  BMI 28.88 kg/m2  SpO2 98%  Physical Exam  Constitutional: She is oriented to person, place, and time.  Neck: Normal range of motion.  L neck site is still ecchymotic No hematoma Minimally sore to touch No sign of infection  Musculoskeletal: Normal range of motion.  Neurological: She is alert and oriented to person,  place, and time.  Skin: Skin is warm and dry.  Psychiatric: She has a normal mood and affect. Her behavior is normal. Judgment and thought content normal.  Nursing note and vitals reviewed.   Imaging: US Soft Tissue Head/neck  11/19/2014  CLINICAL DATA:  Recent direct carotid access for intracranial intervention EXAM: ULTRASOUND OF HEAD/NECK SOFT TISSUES TECHNIQUE: Ultrasound examination of the head and neck soft tissues was performed in the area of clinical concern. COMPARISON:  Arteriogram from the previous day FINDINGS: Limited views of the left carotid system are unremarkable. No evidence of hematoma or pseudoaneurysm. This was not a dedicated vascular study. IMPRESSION: Negative for left neck hematoma Electronically Signed   By: Lucrezia Europe M.D.   On: 11/19/2014 10:22   Dg Chest Port 1 View  11/22/2014  CLINICAL DATA:  Respiratory failure EXAM: PORTABLE CHEST 1 VIEW COMPARISON:  11/19/14 FINDINGS: Mild cardiac enlargement. There is prominent bilateral pulmonary arteries suggesting PA hypertension. Persistent opacity in the left base may represent atelectasis or infiltrate. IMPRESSION: Persistent left base opacity. Electronically Signed   By: Kerby Moors M.D.   On: 11/22/2014 08:01    Labs:  CBC:  Recent Labs  11/18/14 1416 11/19/14 0328 11/21/14 0540 11/22/14 0310  WBC 4.2 5.0 4.2 4.4  HGB 10.7* 9.8* 10.4* 10.2*  HCT 31.6* 29.2* 31.1* 30.8*  PLT 103* 104* 109* 105*    COAGS:  Recent Labs  05/12/14 1450 08/07/14 1048 08/12/14 1615 08/12/14 2050 11/13/14 1006  11/18/14 1823  INR 1.02 1.01 1.21  --  1.09  --   APTT  --  29 >200* 34 26 86*    BMP:  Recent Labs  11/20/14 0504 11/21/14 0540 11/22/14 0310 11/23/14 0255  NA 142 138 138 139  K 3.4* 3.7 3.2* 3.7  CL 108 104 104 108  CO2 26 25 25 25   GLUCOSE 82 101* 87 95  BUN 13 14 19 12   CALCIUM 8.1* 7.9* 7.9* 7.9*  CREATININE 0.70 0.70 0.64 0.61  GFRNONAA >60 >60 >60 >60  GFRAA >60 >60 >60 >60    LIVER  FUNCTION TESTS:  Recent Labs  05/03/14 1910 08/07/14 1048 11/13/14 1006 11/21/14 0540  BILITOT 1.1 0.6 0.7  --   AST 29 20 19   --   ALT 20 17 11*  --   ALKPHOS 42 40 41  --   PROT 6.9 6.6 6.6  --   ALBUMIN 3.6 3.5 3.4* 2.8*    Assessment and Plan:  L supraclinoid aneurysm pipeline stent x 2 embolization Direct access from L common carotid artery 11/18/14 Doing well Plan for dc to SNF when bed available  Check P2y12 Resume home BP meds Check ua  Signed: Indica Marcott A 11/23/2014, 8:23 AM   I spent a total of 15 min at the the patient's bedside AND on the patient's hospital floor or unit, greater than 50% of which was counseling/coordinating care for L ICA/ACA aneurysm embolization

## 2014-11-24 ENCOUNTER — Other Ambulatory Visit: Payer: Self-pay | Admitting: Radiology

## 2014-11-24 DIAGNOSIS — I729 Aneurysm of unspecified site: Secondary | ICD-10-CM

## 2014-11-24 MED ORDER — METOPROLOL SUCCINATE ER 25 MG PO TB24
25.0000 mg | ORAL_TABLET | Freq: Two times a day (BID) | ORAL | Status: DC
  Administered 2014-11-24: 25 mg via ORAL
  Filled 2014-11-24: qty 1

## 2014-11-24 MED ORDER — ASPIRIN 325 MG PO TABS: 325.0000 mg | ORAL_TABLET | Freq: Every day | ORAL | Status: DC

## 2014-11-24 NOTE — Progress Notes (Signed)
Discharge and medication instructions given to patient.  Report given to [facility].  No questions or concerns voiced by patient or receiving facility.  Patient discharged with daughter.

## 2014-11-24 NOTE — Clinical Social Work Note (Signed)
Clinical Social Worker facilitated patient discharge including contacting patient family and facility to confirm patient discharge plans.  Clinical information faxed to facility and family agreeable with plan.  CSW arranged transport with patient daughter to [facility].  RN to call report prior to discharge.  Clinical Social Worker will sign off for now as social work intervention is no longer needed. Please consult us again if new need arises.  Jesse Sundy Houchins, LCSW [phone] 

## 2014-11-24 NOTE — Care Management Note (Signed)
Case Management Note  Patient Details  Name: JERLISA DILIBERTO MRN: [id number] Date of Birth: 02-26-1945  Subjective/Objective:  Pt admitted on 11/18/14 s/p pipeline stent placement x 2 for treatment of intracranial aneurysm.  PTA, pt independent of ADLS.  PT/OT recommending SNF at dc for rehab.                   Action/Plan: Pt for dc to [facility] today, per CSW arrangements.    Expected Discharge Date:  11/23/14               Expected Discharge Plan:  Skilled Nursing Facility  In-House Referral:  Clinical Social Work  Discharge planning Services  CM Consult  Post Acute Care Choice:    Choice offered to:     DME Arranged:    DME Agency:     HH Arranged:    [place] Agency:     Status of Service:  Completed, signed off  Medicare Important Message Given:  Yes-second notification given Date Medicare IM Given:    Medicare IM give by:    Date Additional Medicare IM Given:    Additional Medicare Important Message give by:     If discussed at [place] of Stay Meetings, dates discussed:    Additional Comments:  Reinaldo Raddle, RN, BSN  Trauma/Neuro ICU Case Manager [phone]

## 2014-11-24 NOTE — Discharge Summary (Signed)
Patient ID: [name] MRN: [id number] DOB/AGE: 09-07-1945 69 y.o.  Admit date: 11/18/2014 Discharge date: 11/24/2014  Admission Diagnoses: Intracranial aneurysm  Discharge Diagnoses:  Active Problems:   Brain aneurysm   Intracranial aneurysm   Endotracheally intubated   Essential hypertension   Hypokalemia S/p 2 pipeline flow diverter pipeline stents placed over L ICA/ACA jxn Via direct L common carotid artery access by Dr. [name] on 11/18/2014  Discharged Condition: Good, stable.  Hospital Course: Jeanne Lawson is an 69 y.o. female with known left-sided intracranial, supraclinoid intracranial aneurysm. 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 She was seen by Dr. [name] and was scheduled for treatment of the left. This was coordinated with Dr. Oneida Alar to provide open/direct access to left CCA, followed by endovascular treatment of the aneurysm. The patient presented on 11/18/2014 and underwent successful Lt common carotid arteriogram VIA direct lt common carotid access by Vascular and placement of x 2 pipeline flow diverters across large irregular LT ICA /LT ACA A1 junction with general anesthesia.   She was admitted to neuro ICU and remained intubated until 11/21/14, she was successfully extubated and remained neurologically stable. She did develop a small hematoma in left carotid access site that has remained stable without increase growth or signs of continued bleeding. She was evaluated by PT/OT and has ambulated well. She denies any chest pain or worsening shortness of breath from her baseline. She denies any fever or chills. She has urinated on her own successfully and tolerated a regular diet without nausea or vomiting. She has been referred to [facility] facility and has been seen by myself and Dr. [name] today and deemed  stable for discharge today. P2Y12 has been performed and patient is to remain on her ASA 81mg  and Plavix 75 mg daily. She denies any signs of active bleeding. She was given the below instructions and verbalizes understanding. Her labs and vitals are stable and she denies any extremity weakness, new vision, speech or hearing changes. She does have a frontal headache that is relieved with pain medication and has been stable.   Consults: Anesthesia, Vascular, Pulmonary/Critical Care.   Treatments: S/p 2 pipeline flow diverter pipeline stents placed over L ICA/ACA jxn Via direct L common carotid artery access by Dr. [name] on 11/18/2014  Discharge Exam: Blood pressure 126/88, pulse 60, temperature 97.5 F (36.4 C), temperature source Oral, resp. rate 23, height 5\' 1"  (1.549 m), weight 152 lb 12.5 oz (69.3 kg), SpO2 98 %.  Physical Exam:  General: A&Ox3, NAD Neck: Left carotid incision intact, small hematoma stable without signs of continued bleeding Heart: RRR Lungs: CTA b/l Abd: Soft, NT, ND, (+) BS Ext: warm bilaterally, DP intact b/l Neuro: Grossly intact, EOMI, smile symmetrical, tongue midline, speech clear, no ataxia, fine motor intact, equal strength upper and lower extremities.   Disposition: 03-Skilled Nursing Facility [facility].   Discharge Instructions    Call MD for:  difficulty breathing, headache or visual disturbances    Complete by:  As directed      Call MD for:  extreme fatigue    Complete by:  As directed      Call MD for:  hives    Complete by:  As directed      Call MD for:  persistant dizziness or light-headedness    Complete by:  As directed  Call MD for:  persistant nausea and vomiting    Complete by:  As directed      Call MD for:  redness, tenderness, or signs of infection (pain, swelling, redness, odor or green/yellow discharge around incision site)    Complete by:  As directed      Call MD for:  severe uncontrolled pain     Complete by:  As directed      Call MD for:  temperature >100.4    Complete by:  As directed      Diet - low sodium heart healthy    Complete by:  As directed      Driving Restrictions    Complete by:  As directed   No driving x 2 weeks     Increase activity slowly    Complete by:  As directed      Lifting restrictions    Complete by:  As directed   No lifting over 20 lbs, no bending or stooping for extended periods            Medication List    TAKE these medications        albuterol 108 (90 BASE) MCG/ACT inhaler  Commonly known as:  PROVENTIL HFA;VENTOLIN HFA  Inhale 1-2 puffs into the lungs every 6 (six) hours as needed for wheezing or shortness of breath.     aspirin 81 MG tablet  Take 81 mg by mouth daily.     atorvastatin 40 MG tablet  Commonly known as:  LIPITOR  Take 1 tablet (40 mg total) by mouth daily at 6 PM.     clopidogrel 75 MG tablet  Commonly known as:  PLAVIX  TAKE 1 TABLET BY MOUTH DAILY.     lisinopril-hydrochlorothiazide 20-12.5 MG tablet  Commonly known as:  PRINZIDE,ZESTORETIC  TAKE 1 TABLET BY MOUTH DAILY.     TOPROL XL 50 MG 24 hr tablet  Generic drug:  metoprolol succinate  TAKE 1/2 TABLET BY MOUTH TWICE DAILY.           Follow-up Information    Follow up with Ruta Hinds, MD In 2 weeks.   Specialties:  Vascular Surgery, Cardiology   Why:  Office will call you to arrange your appt (sent)   Contact information:   [place] [address] [phone]       Follow up with [facility] .   Specialty:  Skilled Nursing Facility   Contact information:   [place] [phone]      Follow up with DEVESHWAR, Fritz Pickerel, MD In 2 weeks.   Specialty:  Interventional Radiology   Why:  Our office will call with appointment [phone]   Contact information:   [address] [place] [address] [phone]        Signed: Hedy Jacob 11/24/2014, 12:38 PM   I have spent Greater Than 30 Minutes discharging Jeanne Lawson.

## 2014-12-02 ENCOUNTER — Ambulatory Visit (HOSPITAL_COMMUNITY)
Admission: RE | Admit: 2014-12-02 | Discharge: 2014-12-02 | Disposition: A | Discharge status: Home / Self Care | Payer: Medicare Other | Source: Ambulatory Visit | Attending: Radiology | Admitting: Radiology

## 2014-12-02 DIAGNOSIS — Z48812 Encounter for surgical aftercare following surgery on the circulatory system: Secondary | ICD-10-CM | POA: Diagnosis present

## 2014-12-02 DIAGNOSIS — I729 Aneurysm of unspecified site: Secondary | ICD-10-CM

## 2014-12-02 DIAGNOSIS — Z7902 Long term (current) use of antithrombotics/antiplatelets: Secondary | ICD-10-CM | POA: Insufficient documentation

## 2014-12-02 DIAGNOSIS — Z7982 Long term (current) use of aspirin: Secondary | ICD-10-CM | POA: Diagnosis not present

## 2014-12-02 LAB — PLATELET INHIBITION P2Y12: PLATELET FUNCTION P2Y12: 54 [PRU] — AB (ref 194–418)

## 2014-12-04 ENCOUNTER — Telehealth (HOSPITAL_COMMUNITY): Payer: Self-pay | Admitting: Interventional Radiology

## 2014-12-04 NOTE — Telephone Encounter (Signed)
Called pt, left VM to see how she was doing following her recent procedure. Told her to call if she had any problems or concerns. JM

## 2014-12-08 ENCOUNTER — Encounter: Payer: Self-pay | Admitting: Vascular Surgery

## 2014-12-09 ENCOUNTER — Telehealth (HOSPITAL_COMMUNITY): Payer: Self-pay | Admitting: *Deleted

## 2014-12-09 NOTE — Telephone Encounter (Signed)
Called patient and informed her Dr. [name] approved of the BP medication change as per previous note

## 2014-12-09 NOTE — Telephone Encounter (Signed)
Called patient with lab results and directions per Dr. [name].  Patient to continue current dose of Plavix 75mg  daily and ASA 81mg  daily.  She voiced understanding.  She also asked about her BP medication, stated she talked with Anderson Malta yesterday.  I will follow up with Dr. [name] and return call to her later to regarding this.

## 2014-12-09 NOTE — Telephone Encounter (Signed)
I spoke with Dr. [name] about BP medication being changed as pt has cough on the Lisinopril HCTZ, he said it was okay to take the new medication   (Losartan)  ordered by  MD Losartan

## 2014-12-10 ENCOUNTER — Ambulatory Visit (INDEPENDENT_AMBULATORY_CARE_PROVIDER_SITE_OTHER): Payer: Medicare Other | Admitting: Vascular Surgery

## 2014-12-10 ENCOUNTER — Encounter: Payer: Self-pay | Admitting: Vascular Surgery

## 2014-12-10 VITALS — BP 158/79 | HR 48 | Ht 61.0 in | Wt 155.9 lb

## 2014-12-10 DIAGNOSIS — I671 Cerebral aneurysm, nonruptured: Secondary | ICD-10-CM

## 2014-12-10 NOTE — Progress Notes (Signed)
  POST OPERATIVE OFFICE NOTE    CC:  F/u for surgery  HPI:  This is a 69 y.o. female who is s/p exposure and repair of left common carotid artery.  She states that she has done much better with this procedure than the last one.  She states that she does have a sore throat and some hoarseness since surgery.  She states that occasionally, she does have trouble finding a word, but this is unchanged from pre op.    Allergies  Allergen Reactions  . Dilaudid [Hydromorphone] Swelling  . Codeine Nausea And Vomiting  . Hydromorphone Hcl Swelling  . Latex Swelling    gloves used at dental office caused lips to swell. Used different ones no problem. Never had any problem at hospital or anywhere else.  . Sulfa Drugs Cross Reactors Other (See Comments)    Unknown childhood reaction    Current Outpatient Prescriptions  Medication Sig Dispense Refill  . albuterol (PROVENTIL HFA;VENTOLIN HFA) 108 (90 BASE) MCG/ACT inhaler Inhale 1-2 puffs into the lungs every 6 (six) hours as needed for wheezing or shortness of breath.    Marland Kitchen aspirin 81 MG tablet Take 81 mg by mouth daily.    Marland Kitchen atorvastatin (LIPITOR) 40 MG tablet Take 1 tablet (40 mg total) by mouth daily at 6 PM. 30 tablet 3  . clopidogrel (PLAVIX) 75 MG tablet TAKE 1 TABLET BY MOUTH DAILY. 30 tablet 5  . lisinopril-hydrochlorothiazide (PRINZIDE,ZESTORETIC) 20-12.5 MG tablet TAKE 1 TABLET BY MOUTH DAILY. 30 tablet 0  . TOPROL XL 50 MG 24 hr tablet TAKE 1/2 TABLET BY MOUTH TWICE DAILY. 30 tablet 3   No current facility-administered medications for this visit.     ROS:  See HPI  Physical Exam:  Filed Vitals:   12/10/14 1352  BP: 158/79  Pulse:     Incision:  Healing nicely with a spitting stitch distally Extremities:  5/5 BUE  Neuro: in tact Cardiac:  No carotid bruits are heard  Assessment/Plan:  This is a 69 y.o. female who is s/p: Exposure and repair of left common carotid artery   -pt is doing well since surgery -she does continue  to have some hoarseness, which could be due to irritation of the vagus nerve.   This could take up to a year to improve.   -she does have f/u appt with IR in 3 months for MRI -she will f/u with Dr. Oneida Alar as needed.   Leontine Locket, PA-C [facility] [phone]  Clinic MD:  Pt seen and examined with Dr. Oneida Alar  History and exam details as above. Patient does have some mild hoarseness. She continues to improve with time. She will follow-up with me in an as-needed basis.  Ruta Hinds, MD [facility] [place] Office: [phone] Pager: [phone]

## 2014-12-11 ENCOUNTER — Telehealth (HOSPITAL_COMMUNITY): Payer: Self-pay | Admitting: Interventional Radiology

## 2014-12-11 NOTE — Telephone Encounter (Signed)
Called pt to make sure that she had been informed about Dr. Arlean Hopping recommendation. She stated that Jeanne Lawson had called her already with the info. JM

## 2014-12-25 ENCOUNTER — Other Ambulatory Visit: Payer: Self-pay | Admitting: Cardiology

## 2015-01-20 ENCOUNTER — Other Ambulatory Visit: Payer: Self-pay | Admitting: Cardiology

## 2015-01-21 ENCOUNTER — Encounter: Payer: Self-pay | Admitting: Cardiology

## 2015-01-21 ENCOUNTER — Ambulatory Visit (INDEPENDENT_AMBULATORY_CARE_PROVIDER_SITE_OTHER): Payer: Medicare Other | Admitting: Cardiology

## 2015-01-21 VITALS — BP 120/78 | HR 66 | Ht 61.0 in | Wt 159.0 lb

## 2015-01-21 DIAGNOSIS — I259 Chronic ischemic heart disease, unspecified: Secondary | ICD-10-CM

## 2015-01-21 NOTE — Progress Notes (Signed)
01/21/2015 Jeanne Lawson   1945/02/06  [id number]  Primary Physician [name], MD Primary Cardiologist: Dr. Mare Ferrari   Reason for Visit/CC: Routine 6 month F/u for CAD  HPI:  69 year old female, followed by Dr. Mare Ferrari, who presents for routine 6 month f/u.  She has a past history of multiple strokes. She's had problems with high blood pressure in the past. She has a history of prior coronary artery disease and 16 years ago Dr. Ilda Foil did cardiac catheterization and angioplasty but no stent. We don't have those records. The patient has had several subsequent Cardiolite stress tests which have been normal but have been poorly tolerated by the patient and she does not want any further nuclear stress tests. She does have a chronically abnormal EKG with anterolateral T wave inversions. She denies any symptoms of angina or dyspnea since her last OV. Unfortunately, she has had recurrent strokes. Her last CVA was in April 2016. 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 This is followed by Dr. [name] and Dr. Oneida Alar.   She mainly presents to clinic for medication refills. She denies any chest pain, dyspnea, exercise intolerance, dizziness, fatigue, syncope/ near syncope, palpitations or edema. She reports full mediation compliance. BP is well controlled on current regimen at 120/78. HR is stable at 66 bpm.    Current Outpatient Prescriptions  Medication Sig Dispense Refill  . albuterol (PROVENTIL HFA;VENTOLIN HFA) 108 (90 BASE) MCG/ACT inhaler Inhale 1-2 puffs into the lungs every 6 (six) hours as needed for wheezing or shortness of breath.    Marland Kitchen aspirin 81 MG tablet Take 81 mg by mouth daily.    Marland Kitchen atorvastatin (LIPITOR) 40 MG tablet Take 1 tablet (40 mg total) by mouth daily at 6 PM. 30 tablet 3  . clopidogrel (PLAVIX) 75 MG tablet TAKE 1 TABLET BY MOUTH DAILY. 30 tablet 5  . lisinopril-hydrochlorothiazide (PRINZIDE,ZESTORETIC) 20-12.5 MG tablet  Take 1 tablet by mouth daily. 30 tablet 0  . TOPROL XL 50 MG 24 hr tablet TAKE 1/2 TABLET BY MOUTH TWICE DAILY 30 tablet 0   No current facility-administered medications for this visit.    Allergies  Allergen Reactions  . Dilaudid [Hydromorphone] Swelling  . Codeine Nausea And Vomiting  . Hydromorphone Hcl Swelling  . Latex Swelling    gloves used at dental office caused lips to swell. Used different ones no problem. Never had any problem at hospital or anywhere else.  [facility] Drugs Cross Reactors Other (See Comments)    Unknown childhood reaction    Social History   Social History  . Marital Status: Widowed    Spouse Name: N/A  . Number of Children: N/A  . Years of Education: N/A   Occupational History  . Not on file.   Social History Main Topics  . Smoking status: Former Smoker -- 1.00 packs/day for 30 years    Types: Cigarettes    Quit date: 04/26/2014  . Smokeless tobacco: Never Used  . Alcohol Use: No  . Drug Use: No  . Sexual Activity: Not on file   Other Topics Concern  . Not on file   Social History Narrative     Review of Systems: General: negative for chills, fever, night sweats or weight changes.  Cardiovascular: negative for chest pain, dyspnea on exertion, edema, orthopnea, palpitations, paroxysmal nocturnal dyspnea or shortness of breath Dermatological: negative for rash Respiratory: negative for cough or wheezing Urologic: negative for hematuria Abdominal: negative for nausea, vomiting, diarrhea,  bright red blood per rectum, melena, or hematemesis Neurologic: negative for visual changes, syncope, or dizziness All other systems reviewed and are otherwise negative except as noted above.    Blood pressure 120/78, pulse 66, height 5\' 1"  (1.549 m), weight 159 lb (72.122 kg).  General appearance: alert, cooperative and no distress Neck: no carotid bruit and no JVD Lungs: clear to auscultation bilaterally Heart: regular rate and rhythm, S1, S2  normal, no murmur, click, rub or gallop Extremities: no LEE Pulses: 2+ and symmetric Skin: warm and dry Neurologic: Grossly normal  EKG not performed  ASSESSMENT AND PLAN:    1. CAD: h/o balloon angioplasty by Dr. Ilda Foil 16 years ago. She denies any anginal symptoms.   2. HTN: well controlled on current regimen. Recent BMP showed normal renal function and K. Continue lisinopril-HCTZ.   3. H/o Multiple CVAs: Known aneurysms of the cerebral circulation and carotid artery disease. Followed by Dr. Oneida Alar and Dr. [name].   PLAN  No medical changes made. F/u in 6 months with MD.   Lyda Jester PA-C 01/21/2015 9:15 AM

## 2015-01-21 NOTE — Patient Instructions (Signed)
Medication Instructions:  Your physician recommends that you continue on your current medications as directed. Please refer to the Current Medication list given to you today.   Labwork: -None  Testing/Procedures: -None  Follow-Up: Your physician wants you to follow-up in: 6 months with Dr. Nelson. You will receive a reminder letter in the mail two months in advance. If you don't receive a letter, please call our office to schedule the follow-up appointment.   Any Other Special Instructions Will Be Listed Below (If Applicable).     If you need a refill on your cardiac medications before your next appointment, please call your pharmacy.   

## 2015-02-02 ENCOUNTER — Other Ambulatory Visit (HOSPITAL_COMMUNITY): Payer: Self-pay | Admitting: Interventional Radiology

## 2015-02-02 DIAGNOSIS — I639 Cerebral infarction, unspecified: Secondary | ICD-10-CM

## 2015-02-02 DIAGNOSIS — I729 Aneurysm of unspecified site: Secondary | ICD-10-CM

## 2015-02-17 ENCOUNTER — Other Ambulatory Visit: Payer: Self-pay | Admitting: Cardiology

## 2015-02-19 ENCOUNTER — Telehealth (HOSPITAL_COMMUNITY): Payer: Self-pay | Admitting: Interventional Radiology

## 2015-02-19 NOTE — Telephone Encounter (Signed)
Returned pt's call. She is concerned about taking Plavix. States that she is having an extreme amount of bruising. She chose not to take her Plavix on 02/18/15. I advised pt that she needs to come to the hospital to have a P2Y12 drawn. She states that she will just wait to have the P2Y12 drawn the day of her consult on 03/01/15. I advised her that she should continue on her medications as prescribed. JM

## 2015-03-01 ENCOUNTER — Other Ambulatory Visit: Payer: Self-pay | Admitting: General Surgery

## 2015-03-01 ENCOUNTER — Ambulatory Visit (HOSPITAL_COMMUNITY): Payer: Medicare Other

## 2015-03-01 ENCOUNTER — Other Ambulatory Visit (HOSPITAL_COMMUNITY): Payer: Self-pay | Admitting: Interventional Radiology

## 2015-03-01 ENCOUNTER — Ambulatory Visit (HOSPITAL_COMMUNITY)
Admission: RE | Admit: 2015-03-01 | Discharge: 2015-03-01 | Disposition: A | Discharge status: Home / Self Care | Payer: Medicare Other | Source: Ambulatory Visit | Attending: Interventional Radiology | Admitting: Interventional Radiology

## 2015-03-01 DIAGNOSIS — I671 Cerebral aneurysm, nonruptured: Secondary | ICD-10-CM | POA: Insufficient documentation

## 2015-03-01 DIAGNOSIS — I639 Cerebral infarction, unspecified: Secondary | ICD-10-CM

## 2015-03-01 DIAGNOSIS — I729 Aneurysm of unspecified site: Secondary | ICD-10-CM

## 2015-03-01 DIAGNOSIS — Z8673 Personal history of transient ischemic attack (TIA), and cerebral infarction without residual deficits: Secondary | ICD-10-CM | POA: Insufficient documentation

## 2015-03-01 DIAGNOSIS — Z95828 Presence of other vascular implants and grafts: Secondary | ICD-10-CM | POA: Diagnosis not present

## 2015-03-01 LAB — PLATELET INHIBITION P2Y12: PLATELET FUNCTION P2Y12: 86 [PRU] — AB (ref 194–418)

## 2015-03-01 MED ORDER — IOHEXOL 350 MG/ML SOLN
50.0000 mL | Freq: Once | INTRAVENOUS | Status: AC | PRN
  Administered 2015-03-01: 50 mL via INTRAVENOUS

## 2015-03-01 NOTE — Progress Notes (Signed)
    Referring Physician(s): [id number]  Chief Complaint:  CTA Head/neck 03/01/15  Subjective:  30 cc contrast/saline extravasation into superior to Rt antecubital space    Allergies: Dilaudid; Codeine; Hydromorphone hcl; Latex; and Sulfa drugs cross reactors  Medications: Prior to Admission medications   Medication Sig Start Date End Date Taking? Authorizing Provider  albuterol (PROVENTIL HFA;VENTOLIN HFA) 108 (90 BASE) MCG/ACT inhaler Inhale 1-2 puffs into the lungs every 6 (six) hours as needed for wheezing or shortness of breath.    Historical Provider, MD  aspirin 81 MG tablet Take 81 mg by mouth daily.    Historical Provider, MD  atorvastatin (LIPITOR) 40 MG tablet Take 1 tablet (40 mg total) by mouth daily at 6 PM. 05/27/14   Darlin Coco, MD  clopidogrel (PLAVIX) 75 MG tablet TAKE 1 TABLET BY MOUTH DAILY. 05/27/14   Darlin Coco, MD  lisinopril-hydrochlorothiazide (PRINZIDE,ZESTORETIC) 20-12.5 MG tablet TAKE 1 TABLET BY MOUTH DAILY. 02/17/15   Darlin Coco, MD  TOPROL XL 50 MG 24 hr tablet TAKE 1/2 TABLET BY MOUTH TWICE DAILY 02/17/15   Darlin Coco, MD     Vital Signs: There were no vitals taken for this visit.  Physical Exam  Constitutional: She is oriented to person, place, and time.  Pulmonary/Chest: Effort normal.  Musculoskeletal: Normal range of motion.  Rt arm 2+ pulses FROM Good grip B Good sensation  Neurological: She is alert and oriented to person, place, and time.  Skin: Skin is warm and dry. No erythema.  Skin intact No redness NT to touch; soft Noted swelling at extravasation area compared to left    Nursing note and vitals reviewed.   Imaging: No results found.  Labs:  CBC:  Recent Labs  11/18/14 1416 11/19/14 0328 11/21/14 0540 11/22/14 0310  WBC 4.2 5.0 4.2 4.4  HGB 10.7* 9.8* 10.4* 10.2*  HCT 31.6* 29.2* 31.1* 30.8*  PLT 103* 104* 109* 105*    COAGS:  Recent Labs  05/12/14 1450 08/07/14 1048  08/12/14 1615 08/12/14 2050 11/13/14 1006 11/18/14 1823  INR 1.02 1.01 1.21  --  1.09  --   APTT  --  29 >200* 34 26 86*    BMP:  Recent Labs  11/20/14 0504 11/21/14 0540 11/22/14 0310 11/23/14 0255  NA 142 138 138 139  K 3.4* 3.7 3.2* 3.7  CL 108 104 104 108  CO2 26 25 25 25   GLUCOSE 82 101* 87 95  BUN 13 14 19 12   CALCIUM 8.1* 7.9* 7.9* 7.9*  CREATININE 0.70 0.70 0.64 0.61  GFRNONAA >60 >60 >60 >60  GFRAA >60 >60 >60 >60    LIVER FUNCTION TESTS:  Recent Labs  05/03/14 1910 08/07/14 1048 11/13/14 1006 11/21/14 0540  BILITOT 1.1 0.6 0.7  --   AST 29 20 19   --   ALT 20 17 11*  --   ALKPHOS 42 40 41  --   PROT 6.9 6.6 6.6  --   ALBUMIN 3.6 3.5 3.4* 2.8*    Assessment and Plan:  Rt arm 30 cc contrast extravasation into antecubital space Rec: ice;elevation We will call pt 2/7 to check status   Electronically Signed: Braidon Chermak A 03/01/2015, 2:28 PM   I spent a total of 15 Minutes at the the patient's bedside AND on the patient's hospital floor or unit, greater than 50% of which was counseling/coordinating care for Rt arm CT contrast extravasation

## 2015-03-01 NOTE — Progress Notes (Signed)
Jeanne Lawson    [id number]   [birth year]  Type of procedure: CTA Head,neck  03/01/2015      During your examination at [facility] some of the x-ray dye leaked into the tissues around the vein that the x-ray dye was given through.  What should you do?  1. Apply ice 20 minutes four times a day for three days.  2. Keep the affected extremity elevated above the rest of your body for 48 hours.  3. If you develop any of the following signs or symptoms, please contact Radiology at [phone]  Increased pain  Increasing swelling   Change in sensation (ex. Numbness, tingling)  Development of redness or increase in warmth around the affected area  Increasing hardness  Blistering   I have read and understand these instructions.  Any questions I raised have been discussed to my satisfaction.  I understand that failure to follow these instructions may result in additional complications.

## 2015-03-02 ENCOUNTER — Telehealth: Payer: Self-pay | Admitting: Radiology

## 2015-03-02 ENCOUNTER — Telehealth (HOSPITAL_COMMUNITY): Payer: Self-pay | Admitting: *Deleted

## 2015-03-02 ENCOUNTER — Telehealth (HOSPITAL_COMMUNITY): Payer: Self-pay

## 2015-03-02 LAB — POCT I-STAT CREATININE: CREATININE: 0.8 mg/dL (ref 0.44–1.00)

## 2015-03-02 NOTE — Telephone Encounter (Signed)
Attempted to call cell phone number no answer, will call again later

## 2015-03-02 NOTE — Telephone Encounter (Signed)
No answer will call again, no option for VM

## 2015-03-02 NOTE — Telephone Encounter (Signed)
Informed pt that Dr. [name] would like for her to do a f/u MRI/MRA of the brain in 6 months. Pt agreed with this plan. AW

## 2015-03-02 NOTE — Progress Notes (Signed)
  Called pt today after 30 cc saline/Cx extravasation to Rt antecubital area 2/6  Doing well Swelling has resolved Pt states she sees no redness Has no pain Moving arm well   Reassurance

## 2015-03-05 ENCOUNTER — Telehealth (HOSPITAL_COMMUNITY): Payer: Self-pay | Admitting: *Deleted

## 2015-03-05 NOTE — Telephone Encounter (Signed)
Called patient per Dr. [name] instructions patient to now take 1/2 tab of Plavix (37.5mg ) daily and continue the same dose of ASA.  Pt repeated instructions to me.

## 2015-03-08 ENCOUNTER — Ambulatory Visit: Payer: Medicare Other | Attending: Internal Medicine | Admitting: Physical Therapy

## 2015-03-08 DIAGNOSIS — R29898 Other symptoms and signs involving the musculoskeletal system: Secondary | ICD-10-CM | POA: Insufficient documentation

## 2015-03-08 DIAGNOSIS — R531 Weakness: Secondary | ICD-10-CM

## 2015-03-08 DIAGNOSIS — R2681 Unsteadiness on feet: Secondary | ICD-10-CM | POA: Diagnosis present

## 2015-03-08 DIAGNOSIS — R6889 Other general symptoms and signs: Secondary | ICD-10-CM | POA: Insufficient documentation

## 2015-03-08 DIAGNOSIS — R269 Unspecified abnormalities of gait and mobility: Secondary | ICD-10-CM | POA: Insufficient documentation

## 2015-03-08 DIAGNOSIS — Z7409 Other reduced mobility: Secondary | ICD-10-CM | POA: Insufficient documentation

## 2015-03-08 DIAGNOSIS — M21372 Foot drop, left foot: Secondary | ICD-10-CM

## 2015-03-08 NOTE — Therapy (Signed)
[facility] [address] [place], [place], [address] Phone: [phone]   Fax:  [phone]  Physical Therapy Evaluation  Patient Details  Name: Jeanne Lawson MRN: [id number] Date of Birth: 22-Sep-1945 Referring Provider: Dr. [name]  Encounter Date: 03/08/2015      PT End of Session - 03/08/15 1024    Visit Number 1   Number of Visits 17   Date for PT Re-Evaluation 05/08/15   Authorization Type UHC Medicare   Authorization Time Period 03-08-15 - 05-08-15   PT Start Time 0847   PT Stop Time 0936   PT Time Calculation (min) 49 min      Past Medical History  Diagnosis Date  . Ventricular hypertrophy 04/2009    LVH with diastolic dysfunction by echo. Has normal EF.  Marland Kitchen Pancreatitis     x2  . PAC (premature atrial contraction)   . TIA (transient ischemic attack)     She was hospitalized 04-23-09 through 04-27-09 for involving right side of the body  . Hypertension   . Tobacco abuse     Ongoing   . Edema of foot     She has a history of chronic edema of the left dated back to age 43 when she suufered severe frostbite playing  on the snow as a child  . Coronary artery disease 1996    Known with prior mild lesion of LAD demonstrated by Cardiac Catheterization in 1996  . COPD (chronic obstructive pulmonary disease) ([place])   . Shortness of breath dyspnea     since stroke 2 months ago -  . 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  Marland Kitchen PONV (postoperative nausea and vomiting)     problems waking up last time 4/16 only time  . 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 Neck surgery  50 yrs ago    Left side tumor  . US  echocardiography  04-26-2009    EF 65-70%  . Cardiovascular stress test  12-02-2001    EF 70%  . Cardiac catheterization  1996    Mild CAD with vasospasm  . Abdominal hysterectomy    . Laparoscopy    . Radiology with anesthesia N/A 05/25/2014    Procedure: RADIOLOGY WITH ANESTHESIA;  Surgeon: [name], MD;  Location: [facility];  Service: Radiology;  Laterality: N/A;  . Appendectomy    . Radiology with anesthesia N/A 08/12/2014    Procedure: RADIOLOGY WITH ANESTHESIA;  Surgeon: [name], MD;  Location: [facility];  Service: Radiology;  Laterality: N/A;  . Endarterectomy Right 08/12/2014    Procedure: RIGHT  COMMON CAROTID ARTERY EXPOSURE FOR INTERVENTIONAL RADIOLOGY PROCEDURE BY DR.DEVASHWAR,Insertion 6 FR sheath;  Surgeon: Elam Dutch, MD;  Location: [facility] OR;  Service: Vascular;  Laterality: Right;  . Endarterectomy Right 08/12/2014    Procedure:  CAROTID  EXPOSURE CLOSURE RIGHT NECK, REPAIR RIGHT COMMON CAROTID ARTERY;  Surgeon: Elam Dutch, MD;  Location: [place] [facility];  Service: Vascular;  Laterality: Right;  . Endarterectomy Left 11/18/2014    Procedure: CAROTID EXPOSURE;  Surgeon: Elam Dutch, MD;  Location: [place];  Service: Vascular;  Laterality: Left;  . Endarterectomy Left 11/18/2014    Procedure: CLOSURE CAROTID;  Surgeon: Elam Dutch, MD;  Location: [facility];  Service: Vascular;  Laterality: Left;    There were no vitals filed for this visit.  Visit Diagnosis:  Left leg weakness - Plan: PT plan of care cert/re-cert  Abnormality of gait - Plan: PT plan of care cert/re-cert  Decreased functional activity tolerance - Plan: PT plan of care cert/re-cert  Unsteadiness on feet - Plan: PT plan of care cert/re-cert  Decreased strength, endurance, and mobility - Plan: PT plan of care cert/re-cert  Left foot drop - Plan: PT plan of care cert/re-cert      Subjective Assessment - 03/08/15 1008    Subjective Pt reports she sustained  a new CVA on 05-01-14 with main symptom  apashia and horseness; was admitted to [facility] and MRA's revealed multiple aneurysms in brain; pt now reports increased weakness and heaviness in LLE  with L foot dragging more than it previously did   Pertinent History h/o old R CVA in 2010; TIA 04-23-09 - 04-27-09:  CAD:  COPD:  chronic edema in L foot dated back to age 32: saccular aneurysm, paroxysmal atrial fibrillation, known cerebral aneurysms by MRA in 2011; ventricular hypertrophy:  HTN   Diagnostic tests MRI - revealed small L subcortical ischemic CVA;  MRA revealed multiple brain aneurysms - enlarging (risk for rupture)   Patient Stated Goals to learn to walk again; increase L leg strength   Currently in Pain? No/denies            [facility] PT Assessment - 03/08/15 0911    Assessment   Medical Diagnosis CVA; s/p aneurysms with surgery   Referring Provider Dr. [name]   Onset Date/Surgical Date 05/01/14   Prior Therapy --  [facility] and [facility]   Precautions   Precautions Fall   Precaution Comments L foot drags   Restrictions   Weight Bearing Restrictions No   Balance Screen   Has the patient fallen in the past 6 months No   Has the patient had a decrease in activity level because of a fear of falling?  No   Is the patient reluctant to leave their home because of a fear of falling?  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 [place]   Left Hip Flexion 4-/5   Left Knee Flexion 4-/5   Left Knee Extension 4/5   Left Ankle Dorsiflexion 3-/5   Ambulation/Gait   Ambulation/Gait Yes   Ambulation/Gait Assistance 6: Modified independent (Device/Increase time)   Ambulation  Distance (Feet) 120 Feet   Assistive device Straight cane   Gait Pattern Decreased dorsiflexion - left;Poor foot clearance - left;Decreased step length - left;Decreased hip/knee flexion - left;Left foot flat   Ambulation Surface Level;Indoor   Gait velocity 2.19   with cane   Stairs Yes   Stair Management Technique One rail Left;Alternating pattern  step by step with descension   Number of Stairs 4   Height of Stairs 6   Timed Up and Go Test   Normal  TUG (seconds) 10.19  with cane                             PT Short Term Goals - 03/08/15 1043    PT SHORT TERM GOAL #1   Title Increase gait velocity to >/= 2.50 ft/sec with cane for incr. gait efficiency.  (04-05-15)   Baseline 2.19 ft/sec with cane   Time 4   Period Weeks   Status New   PT SHORT TERM GOAL #2   Title Increase Berg balance test score by at least 4 points for reduced fall risk.  (04-05-15)   Baseline TBA   Time 4   Period Weeks   Status New   PT SHORT TERM GOAL #3   Title Increase distance in 3" walk test by at least 50' to demonstrate increased endurance.  (04-05-15)   Baseline TBA   Time 4   Period Weeks   Status New   PT SHORT TERM GOAL #4   Title Trial AFO for LLE for incr. safety with ambulation.  (04-05-15)   Time 4   Period Weeks   Status New   PT SHORT TERM GOAL #5   Title Independent in HEP for  LLE strengthening exercises.  (04-05-15)   Time 4   Period Weeks   Status New           PT Long Term Goals - 03/08/15 1049    PT LONG TERM GOAL #1   Title Independent in updated HEP for LLE strengthening and stretching (05-08-15)   Time 8   Period Weeks   Status New   PT LONG TERM GOAL #2   Title Report at least 50% improvement in LLE flexibility and ease of movement (05-08-15)   Time 8   Period Weeks   Status New   PT LONG TERM GOAL #4   Title Incr. gait velocity to >/= 2.9 ft/sec with cane for incr. gait efficiency.  (05-08-15)   Baseline 2.19 ft/sec with cane   Time 8    Period Weeks   Status New   PT LONG TERM GOAL #5   Title Amb. 6" nonstop with cane to demo incr. endurance/activity tolerance.  (05-08-15)   Time 8   Period Weeks   Status New   Additional Long Term Goals   Additional Long Term Goals Yes   PT LONG TERM GOAL #6   Title Increase Berg balance score by at least 6 points to demo improved balance and reduced fall risk.  (05-08-15)   Time 8   Period Weeks   Status New               Plan - 03/08/15 1027    Clinical Impression Statement Pt is a 70 year old lady s/p increased L sided weakness, gait and balance deficits, decreased muscle endurance and voice volume changes/mild aphasia due to L subcortical ischemic CVA sustained on 05-01-14.  Pt was admitted to [facility]  on 05-01-14 and discharged to [facility] on 05-04-14.  MRA revealed multiple brain aneurysms.  Pt underwent most recent surgery for repair of L common carotid artery on 11-18-14 and report she was discharged again to [facility]  with follow up [facility] PT.  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  Comorbidities impacting POC include TIA 04-23-09 - 04-27-09, HTN, chronic edema L foot due to injury at age  10, CAD, COPD, multiple aneuryms, paroxysmal atrial fibrillaiton, and known cerebral aneurysmsby MRA in 2011.  Clinical presentation is evolving. Moderate complexity required in clincal decision making.                                                                                                                                                                                                                       Pt will benefit from skilled therapeutic intervention in order to improve on the following deficits Abnormal gait;Cardiopulmonary status limiting activity;Decreased activity tolerance;Decreased balance;Decreased mobility;Decreased strength;Decreased knowledge of use of DME;Decreased endurance;Decreased coordination;Decreased range  of motion;Increased edema;Impaired flexibility   Rehab Potential Good   PT Frequency 2x / week   PT Duration 8 weeks   PT Treatment/Interventions ADLs/Self Care Home Management;Therapeutic exercise;Therapeutic activities;Functional mobility training;Stair training;Gait training;DME Instruction;Balance training;Neuromuscular re-education;Patient/family education;Orthotic Fit/Training;Passive range of motion   PT Next Visit Plan Do Berg balance test; 3" walk test; give pictures for stretches for LLE; trial AFO?   PT Home Exercise Plan Stretches and strengthening for LLE   Recommended Other Services AFO recommended for safety with prolonged ambulation distance - pt declines at this time   Consulted and Agree with Plan of Care Patient          G-Codes - 2015-03-09 1056    Functional Assessment Tool Used TUG score 10.9 secs with cane;  Gait velocity 2.19 ft/sec with cane; gait deviations include decr. L foot clearance and decr. L hip and knee flexion   Functional Limitation Mobility: Walking and moving around   Mobility: Walking and Moving Around Current Status [phone]) At least 40 percent but less than 60 percent impaired, limited or restricted   Mobility: Walking and Moving Around Goal Status [phone]) At least 20 percent but less than 40 percent impaired, limited or restricted       Problem List Patient Active Problem List   Diagnosis Date Noted  . Hypokalemia   . Endotracheally intubated   . Essential hypertension   . Intracranial aneurysm 11/18/2014  . Respiratory failure ([place])   . Cerebral aneurysm   . Brain aneurysm   . Aphasia   . Palmar erythema   . CVA (cerebral infarction) 05/02/2014  . Aneurysm ([place]) 05/02/2014  . Expressive aphasia 05/02/2014  . 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 Tobacco abuse 10/24/2010  . TIA (transient ischemic attack)   . Hypertension   . Ventricular hypertrophy   . CVA 02/23/2010  . STRESS FRACTURE,  FOOT  02/22/2010    Alda Lea, PT 03/08/2015, 11:00 AM  [facility] [address] [place], [place], [address] Phone: [phone]   Fax:  [phone]  Name: Jeanne Lawson MRN: [id number] Date of Birth: 15-Jul-1945

## 2015-03-09 ENCOUNTER — Ambulatory Visit: Payer: Medicare Other | Admitting: Physical Therapy

## 2015-03-09 DIAGNOSIS — Z7409 Other reduced mobility: Secondary | ICD-10-CM

## 2015-03-09 DIAGNOSIS — R29898 Other symptoms and signs involving the musculoskeletal system: Secondary | ICD-10-CM

## 2015-03-09 DIAGNOSIS — R531 Weakness: Secondary | ICD-10-CM

## 2015-03-09 DIAGNOSIS — R269 Unspecified abnormalities of gait and mobility: Secondary | ICD-10-CM

## 2015-03-09 DIAGNOSIS — R6889 Other general symptoms and signs: Secondary | ICD-10-CM

## 2015-03-09 NOTE — Patient Instructions (Signed)
Strengthening: Hip Flexion - Resisted    With tubing around left ankle, anchor behind, bring leg forward, keeping knee straight. Repeat  10  times per set. Do _1___ sets per session. Do __1-2Strengthening: Hip Abduction - Resisted    With tubing around right leg, other side toward anchor, extend leg out from side. Repeat __10__ times per set. Do __1__ sets per session. Do _1-2Knee Extension: Resisted (Sitting)    With band looped around right ankle and under other foot, straighten leg with ankle loop. Keep other leg bent to increase resistance. Repeat ___10_ times per set. Do _1___ sets per session. Do _1-2___ sessions per day.  http://orth.exer.us/691   Copyright  VHI. All rights reserved.    http://orth.exer.us/635   Copyright  VHI. All rights reserved  http://orth.exer.us/639

## 2015-03-10 ENCOUNTER — Encounter: Payer: Self-pay | Admitting: Physical Therapy

## 2015-03-10 NOTE — Therapy (Signed)
[facility] [address] [place], [place], [address] Phone: [phone]   Fax:  [phone]  Physical Therapy Treatment  Patient Details  Name: Jeanne Lawson MRN: [id number] Date of Birth: [birth year] Referring Provider: Dr. [name]  Encounter Date: 03/09/2015      PT End of Session - 03/10/15 1524    Visit Number 2   Number of Visits 17   Date for PT Re-Evaluation 05/08/15   Authorization Type UHC Medicare   Authorization Time Period 03-08-15 - 05-08-15   PT Start Time 0800   PT Stop Time 0845   PT Time Calculation (min) 45 min      Past Medical History  Diagnosis Date  . Ventricular hypertrophy 04/2009    LVH with diastolic dysfunction by echo. Has normal EF.  Marland Kitchen Pancreatitis     x2  . PAC (premature atrial contraction)   . TIA (transient ischemic attack)     She was hospitalized 04-23-09 through 04-27-09 for involving right side of the body  . Hypertension   . Tobacco abuse     Ongoing   . Edema of foot     She has a history of chronic edema of the left dated back to age 5 when she suufered severe frostbite playing  on the snow as a child  . Coronary artery disease 1996    Known with prior mild lesion of LAD demonstrated by Cardiac Catheterization in 1996  . COPD (chronic obstructive pulmonary disease) ([place])   . Shortness of breath dyspnea     since stroke 2 months ago -  . 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  Marland Kitchen PONV (postoperative nausea and vomiting)     problems waking up last time 4/16 only time  . 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 Neck surgery  50 yrs ago    Left side tumor  . US  echocardiography  04-26-2009    EF 65-70%  . Cardiovascular stress test  12-02-2001    EF 70%  . Cardiac catheterization  1996    Mild CAD with vasospasm  . Abdominal hysterectomy    . Laparoscopy    . Radiology with anesthesia N/A 05/25/2014    Procedure: RADIOLOGY WITH ANESTHESIA;  Surgeon: [name], MD;  Location: [facility];  Service: Radiology;  Laterality: N/A;  . Appendectomy    . Radiology with anesthesia N/A 08/12/2014    Procedure: RADIOLOGY WITH ANESTHESIA;  Surgeon: [name], MD;  Location: [facility];  Service: Radiology;  Laterality: N/A;  . Endarterectomy Right 08/12/2014    Procedure: RIGHT  COMMON CAROTID ARTERY EXPOSURE FOR INTERVENTIONAL RADIOLOGY PROCEDURE BY DR.DEVASHWAR,Insertion 6 FR sheath;  Surgeon: Elam Dutch, MD;  Location: [facility];  Service: Vascular;  Laterality: Right;  . Endarterectomy Right 08/12/2014    Procedure:  CAROTID  EXPOSURE CLOSURE RIGHT NECK, REPAIR RIGHT COMMON CAROTID ARTERY;  Surgeon: Elam Dutch, MD;  Location: [facility];  Service: Vascular;  Laterality: Right;  . Endarterectomy Left 11/18/2014    Procedure: CAROTID EXPOSURE;  Surgeon: Elam Dutch, MD;  Location: [facility];  Service: Vascular;  Laterality: Left;  . Endarterectomy Left 11/18/2014    Procedure: CLOSURE CAROTID;  Surgeon: Elam Dutch, MD;  Location: [facility];  Service: Vascular;  Laterality: Left;    There were no vitals filed for this visit.  Visit Diagnosis:  Left leg weakness  Abnormality of gait  Decreased strength, endurance, and mobility      Subjective Assessment - 03/10/15 1517    Subjective Pt reports no changes since eval on Monday of this week   Pertinent History h/o old R CVA in 2010; TIA 04-23-09 - 04-27-09:  CAD:  COPD:  chronic edema in L foot dated back to age 70: saccular aneurysm, paroxysmal atrial fibrillation, known cerebral aneurysms by MRA in 2011; ventricular hypertrophy:  HTN   Diagnostic tests MRI - revealed small L subcortical ischemic CVA;   MRA revealed multiple brain aneurysms - enlarging (risk for rupture)   Patient Stated Goals to learn to walk again; increase L leg strength   Currently in Pain? No/denies                         [facility] Adult PT Treatment/Exercise - 03/10/15 0001    Transfers   Transfers Sit to Stand   Sit to Stand 5: Supervision   Number of Reps 10 reps  no UE support used   Ambulation/Gait   Ambulation/Gait Yes   Ambulation/Gait Assistance 5: Supervision   Ambulation Distance (Feet) 370 Feet  in 3" walk test   Assistive device Straight cane   Gait Pattern Decreased dorsiflexion - left;Poor foot clearance - left;Decreased step length - left;Decreased hip/knee flexion - left;Left foot flat   Stairs Yes   Stair Management Technique One rail Left;Alternating pattern  step by step with descension   Number of Stairs 4   Height of Stairs 6   Exercises   Exercises Knee/Hip;Lumbar;Ankle   Lumbar Exercises: Stretches   Active Hamstring Stretch 1 rep;30 seconds   Lumbar Exercises: Standing   Heel Raises 10 reps  LLE only 10 reps   Lumbar Exercises: Supine   Bent Knee Raise 10 reps  LLE - with green theraband   Straight Leg Raise 10 reps  LLE   Knee/Hip Exercises: Standing   Forward Step Up Left;10 reps;Step Height: 6"     Pt performed R and L hip abduction in hooklying position - 10 reps each leg with green theraband - 3 sec hold  LAQ's and knee flexion - LLE - seated position - with green theraband x 10 reps each            PT Education - 03/10/15 1523    Education provided Yes   Education Details green theraband given - for L LAQ, knee flexion, hip abduction and DF, PF   Person(s) Educated Patient   Methods Explanation;Demonstration;Handout   Comprehension Verbalized understanding;Returned demonstration          PT Short Term Goals - 03/08/15 1043    PT SHORT TERM GOAL #1   Title Increase gait velocity to >/= 2.50 ft/sec with cane for incr. gait efficiency.   (04-05-15)   Baseline 2.19 ft/sec with cane   Time 4   Period Weeks   Status New   PT SHORT TERM GOAL #2   Title Increase Berg balance test score by at least 4 points for reduced fall risk.  (04-05-15)   Baseline TBA   Time 4   Period Weeks   Status New   PT SHORT TERM  GOAL #3   Title Increase distance in 3" walk test by at least 50' to demonstrate increased endurance.  (04-05-15)   Baseline TBA   Time 4   Period Weeks   Status New   PT SHORT TERM GOAL #4   Title Trial AFO for LLE for incr. safety with ambulation.  (04-05-15)   Time 4   Period Weeks   Status New   PT SHORT TERM GOAL #5   Title Independent in HEP for  LLE strengthening exercises.  (04-05-15)   Time 4   Period Weeks   Status New           PT Long Term Goals - 03/08/15 1049    PT LONG TERM GOAL #1   Title Independent in updated HEP for LLE strengthening and stretching (05-08-15)   Time 8   Period Weeks   Status New   PT LONG TERM GOAL #2   Title Report at least 50% improvement in LLE flexibility and ease of movement (05-08-15)   Time 8   Period Weeks   Status New   PT LONG TERM GOAL #4   Title Incr. gait velocity to >/= 2.9 ft/sec with cane for incr. gait efficiency.  (05-08-15)   Baseline 2.19 ft/sec with cane   Time 8   Period Weeks   Status New   PT LONG TERM GOAL #5   Title Amb. 6" nonstop with cane to demo incr. endurance/activity tolerance.  (05-08-15)   Time 8   Period Weeks   Status New   Additional Long Term Goals   Additional Long Term Goals Yes   PT LONG TERM GOAL #6   Title Increase Berg balance score by at least 6 points to demo improved balance and reduced fall risk.  (05-08-15)   Time 8   Period Weeks   Status New               Plan - 03/10/15 1524    Clinical Impression Statement Pt has decreased L dorsiflexion resulting in incomplete L foot clearance in swing phase of gait; pt declines orthosis at this time   Pt will benefit from skilled therapeutic intervention in order  to improve on the following deficits Abnormal gait;Cardiopulmonary status limiting activity;Decreased activity tolerance;Decreased balance;Decreased mobility;Decreased strength;Decreased knowledge of use of DME;Decreased endurance;Decreased coordination;Decreased range of motion;Increased edema;Impaired flexibility   Rehab Potential Good   PT Frequency 2x / week   PT Duration 8 weeks   PT Treatment/Interventions ADLs/Self Care Home Management;Therapeutic exercise;Therapeutic activities;Functional mobility training;Stair training;Gait training;DME Instruction;Balance training;Neuromuscular re-education;Patient/family education;Orthotic Fit/Training;Passive range of motion   PT Next Visit Plan give pictures for L ankle strengthening, hip flexion and hip abduction in hooklying with band   PT Home Exercise Plan Stretches and strengthening for LLE   Recommended Other Services pt declines use of AFO at this time   Consulted and Agree with Plan of Care Patient        Problem List Patient Active Problem List   Diagnosis Date Noted  . Hypokalemia   . Endotracheally intubated   . Essential hypertension   . Intracranial aneurysm 11/18/2014  . Respiratory failure ([place])   . Cerebral aneurysm   . Brain aneurysm   . Aphasia   . Palmar erythema   . CVA (cerebral infarction) 05/02/2014  . Aneurysm ([place]) 05/02/2014  . Expressive aphasia 05/02/2014  . 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 Tobacco abuse 10/24/2010  . TIA (transient ischemic  attack)   . Hypertension   . Ventricular hypertrophy   . CVA 02/23/2010  . STRESS FRACTURE, FOOT 02/22/2010    Alda Lea, PT 03/10/2015, 3:29 PM  [facility] [address] [place], [place], [address] Phone: [phone]   Fax:  [phone]  Name: DERRICK EPPERS MRN: [id number] Date of Birth: January 23, 1946

## 2015-03-12 ENCOUNTER — Observation Stay (HOSPITAL_COMMUNITY): Payer: Medicare Other

## 2015-03-12 ENCOUNTER — Emergency Department (HOSPITAL_COMMUNITY): Payer: Medicare Other

## 2015-03-12 ENCOUNTER — Inpatient Hospital Stay (HOSPITAL_COMMUNITY)
Admission: EM | Admit: 2015-03-12 | Discharge: 2015-03-17 | DRG: 065 | Disposition: A | Discharge status: Home / Self Care | Payer: Medicare Other | Attending: Internal Medicine | Admitting: Internal Medicine

## 2015-03-12 DIAGNOSIS — R4702 Dysphasia: Secondary | ICD-10-CM

## 2015-03-12 DIAGNOSIS — Z87891 Personal history of nicotine dependence: Secondary | ICD-10-CM

## 2015-03-12 DIAGNOSIS — Z7982 Long term (current) use of aspirin: Secondary | ICD-10-CM

## 2015-03-12 DIAGNOSIS — I2510 Atherosclerotic heart disease of native coronary artery without angina pectoris: Secondary | ICD-10-CM | POA: Diagnosis present

## 2015-03-12 DIAGNOSIS — G459 Transient cerebral ischemic attack, unspecified: Secondary | ICD-10-CM | POA: Diagnosis present

## 2015-03-12 DIAGNOSIS — R404 Transient alteration of awareness: Secondary | ICD-10-CM | POA: Diagnosis not present

## 2015-03-12 DIAGNOSIS — I639 Cerebral infarction, unspecified: Secondary | ICD-10-CM | POA: Diagnosis not present

## 2015-03-12 DIAGNOSIS — Z8673 Personal history of transient ischemic attack (TIA), and cerebral infarction without residual deficits: Secondary | ICD-10-CM

## 2015-03-12 DIAGNOSIS — E876 Hypokalemia: Secondary | ICD-10-CM | POA: Diagnosis not present

## 2015-03-12 DIAGNOSIS — R600 Localized edema: Secondary | ICD-10-CM | POA: Diagnosis present

## 2015-03-12 DIAGNOSIS — I63532 Cerebral infarction due to unspecified occlusion or stenosis of left posterior cerebral artery: Principal | ICD-10-CM | POA: Diagnosis present

## 2015-03-12 DIAGNOSIS — R4182 Altered mental status, unspecified: Secondary | ICD-10-CM | POA: Insufficient documentation

## 2015-03-12 DIAGNOSIS — J449 Chronic obstructive pulmonary disease, unspecified: Secondary | ICD-10-CM | POA: Diagnosis present

## 2015-03-12 DIAGNOSIS — E785 Hyperlipidemia, unspecified: Secondary | ICD-10-CM | POA: Diagnosis present

## 2015-03-12 DIAGNOSIS — Z7902 Long term (current) use of antithrombotics/antiplatelets: Secondary | ICD-10-CM

## 2015-03-12 DIAGNOSIS — D696 Thrombocytopenia, unspecified: Secondary | ICD-10-CM

## 2015-03-12 DIAGNOSIS — I10 Essential (primary) hypertension: Secondary | ICD-10-CM | POA: Diagnosis present

## 2015-03-12 DIAGNOSIS — Z79899 Other long term (current) drug therapy: Secondary | ICD-10-CM

## 2015-03-12 DIAGNOSIS — M79606 Pain in leg, unspecified: Secondary | ICD-10-CM

## 2015-03-12 DIAGNOSIS — I119 Hypertensive heart disease without heart failure: Secondary | ICD-10-CM | POA: Diagnosis present

## 2015-03-12 DIAGNOSIS — I69354 Hemiplegia and hemiparesis following cerebral infarction affecting left non-dominant side: Secondary | ICD-10-CM

## 2015-03-12 DIAGNOSIS — G458 Other transient cerebral ischemic attacks and related syndromes: Secondary | ICD-10-CM

## 2015-03-12 DIAGNOSIS — Z6830 Body mass index (BMI) 30.0-30.9, adult: Secondary | ICD-10-CM

## 2015-03-12 DIAGNOSIS — E669 Obesity, unspecified: Secondary | ICD-10-CM | POA: Diagnosis present

## 2015-03-12 LAB — COMPREHENSIVE METABOLIC PANEL
ALT: 13 U/L — ABNORMAL LOW (ref 14–54)
AST: 20 U/L (ref 15–41)
Albumin: 3.5 g/dL (ref 3.5–5.0)
Alkaline Phosphatase: 42 U/L (ref 38–126)
Anion gap: 10 (ref 5–15)
BILIRUBIN TOTAL: 0.9 mg/dL (ref 0.3–1.2)
BUN: 8 mg/dL (ref 6–20)
CO2: 25 mmol/L (ref 22–32)
CREATININE: 0.85 mg/dL (ref 0.44–1.00)
Calcium: 9 mg/dL (ref 8.9–10.3)
Chloride: 103 mmol/L (ref 101–111)
Glucose, Bld: 102 mg/dL — ABNORMAL HIGH (ref 65–99)
POTASSIUM: 3.8 mmol/L (ref 3.5–5.1)
Sodium: 138 mmol/L (ref 135–145)
TOTAL PROTEIN: 6.9 g/dL (ref 6.5–8.1)

## 2015-03-12 LAB — CBC
HEMATOCRIT: 35.7 % — AB (ref 36.0–46.0)
HEMOGLOBIN: 12.1 g/dL (ref 12.0–15.0)
MCH: 29.1 pg (ref 26.0–34.0)
MCHC: 33.9 g/dL (ref 30.0–36.0)
MCV: 85.8 fL (ref 78.0–100.0)
Platelets: 125 10*3/uL — ABNORMAL LOW (ref 150–400)
RBC: 4.16 MIL/uL (ref 3.87–5.11)
RDW: 14.2 % (ref 11.5–15.5)
WBC: 4 10*3/uL (ref 4.0–10.5)

## 2015-03-12 LAB — URINALYSIS, ROUTINE W REFLEX MICROSCOPIC
Bilirubin Urine: NEGATIVE
Glucose, UA: NEGATIVE mg/dL
Hgb urine dipstick: NEGATIVE
Ketones, ur: NEGATIVE mg/dL
LEUKOCYTES UA: NEGATIVE
Nitrite: NEGATIVE
PROTEIN: NEGATIVE mg/dL
Specific Gravity, Urine: 1.006 (ref 1.005–1.030)
pH: 6 (ref 5.0–8.0)

## 2015-03-12 LAB — PROTIME-INR
INR: 1.01 (ref 0.00–1.49)
Prothrombin Time: 13.5 seconds (ref 11.6–15.2)

## 2015-03-12 LAB — DIFFERENTIAL
BASOS ABS: 0 10*3/uL (ref 0.0–0.1)
Basophils Relative: 1 %
EOS ABS: 0.1 10*3/uL (ref 0.0–0.7)
Eosinophils Relative: 2 %
LYMPHS ABS: 1 10*3/uL (ref 0.7–4.0)
Lymphocytes Relative: 25 %
MONOS PCT: 8 %
Monocytes Absolute: 0.3 10*3/uL (ref 0.1–1.0)
NEUTROS ABS: 2.6 10*3/uL (ref 1.7–7.7)
Neutrophils Relative %: 64 %

## 2015-03-12 LAB — I-STAT TROPONIN, ED: TROPONIN I, POC: 0 ng/mL (ref 0.00–0.08)

## 2015-03-12 LAB — I-STAT CHEM 8, ED
BUN: 9 mg/dL (ref 6–20)
CHLORIDE: 99 mmol/L — AB (ref 101–111)
CREATININE: 0.8 mg/dL (ref 0.44–1.00)
Calcium, Ion: 1.09 mmol/L — ABNORMAL LOW (ref 1.13–1.30)
GLUCOSE: 98 mg/dL (ref 65–99)
HCT: 36 % (ref 36.0–46.0)
Hemoglobin: 12.2 g/dL (ref 12.0–15.0)
POTASSIUM: 3.8 mmol/L (ref 3.5–5.1)
SODIUM: 141 mmol/L (ref 135–145)
TCO2: 27 mmol/L (ref 0–100)

## 2015-03-12 LAB — RAPID URINE DRUG SCREEN, HOSP PERFORMED
Amphetamines: NOT DETECTED
BARBITURATES: NOT DETECTED
Benzodiazepines: NOT DETECTED
COCAINE: NOT DETECTED
Opiates: NOT DETECTED
Tetrahydrocannabinol: NOT DETECTED

## 2015-03-12 LAB — APTT: APTT: 27 s (ref 24–37)

## 2015-03-12 LAB — ETHANOL: Alcohol, Ethyl (B): <5 mg/dL (ref <5)

## 2015-03-12 MED ORDER — LORAZEPAM 2 MG/ML IJ SOLN
1.0000 mg | Freq: Once | INTRAMUSCULAR | Status: AC
  Administered 2015-03-12: 1 mg via INTRAVENOUS
  Filled 2015-03-12: qty 1

## 2015-03-12 MED ORDER — ENOXAPARIN SODIUM 40 MG/0.4ML ~~LOC~~ SOLN
40.0000 mg | SUBCUTANEOUS | Status: DC
  Administered 2015-03-12 – 2015-03-16 (×5): 40 mg via SUBCUTANEOUS
  Filled 2015-03-12 (×5): qty 0.4

## 2015-03-12 MED ORDER — STROKE: EARLY STAGES OF RECOVERY BOOK
Freq: Once | Status: AC
  Administered 2015-03-12: 23:00:00
  Filled 2015-03-12: qty 1

## 2015-03-12 MED ORDER — METOPROLOL SUCCINATE ER 25 MG PO TB24
25.0000 mg | ORAL_TABLET | Freq: Two times a day (BID) | ORAL | Status: DC
  Administered 2015-03-13 – 2015-03-17 (×9): 25 mg via ORAL
  Filled 2015-03-12 (×10): qty 1

## 2015-03-12 MED ORDER — CLOPIDOGREL BISULFATE 75 MG PO TABS
75.0000 mg | ORAL_TABLET | Freq: Every day | ORAL | Status: DC
  Filled 2015-03-12: qty 1

## 2015-03-12 MED ORDER — SENNOSIDES-DOCUSATE SODIUM 8.6-50 MG PO TABS: 1.0000 | ORAL_TABLET | Freq: Every evening | ORAL | Status: DC | PRN

## 2015-03-12 MED ORDER — ASPIRIN 81 MG PO CHEW
81.0000 mg | CHEWABLE_TABLET | Freq: Every day | ORAL | Status: DC
  Administered 2015-03-13 – 2015-03-17 (×5): 81 mg via ORAL
  Filled 2015-03-12 (×5): qty 1

## 2015-03-12 MED ORDER — ALBUTEROL SULFATE (2.5 MG/3ML) 0.083% IN NEBU: 3.0000 mL | INHALATION_SOLUTION | Freq: Four times a day (QID) | RESPIRATORY_TRACT | Status: DC | PRN

## 2015-03-12 MED ORDER — ATORVASTATIN CALCIUM 40 MG PO TABS
40.0000 mg | ORAL_TABLET | Freq: Every day | ORAL | Status: DC
  Administered 2015-03-12 – 2015-03-16 (×5): 40 mg via ORAL
  Filled 2015-03-12 (×5): qty 1

## 2015-03-12 NOTE — ED Notes (Signed)
Unable to obtain IV access.

## 2015-03-12 NOTE — ED Notes (Signed)
Patient transported to CT 

## 2015-03-12 NOTE — ED Notes (Signed)
Attempted report 

## 2015-03-12 NOTE — Progress Notes (Signed)
Patient arrived to floor via stretcher with ED tech. Patient alert and oriented X 4. Patient given room information/call bell in reach.  Patient connected to tele. Will continue to monitor.

## 2015-03-12 NOTE — ED Notes (Signed)
Patient transported to MRI 

## 2015-03-12 NOTE — ED Notes (Signed)
Pt was given Ativan for MRI but once over at MRI refused to have test done without being "put to sleep." Pt advised by MRI staff that they do not perform MRI with anesthesia on weekends.

## 2015-03-12 NOTE — ED Notes (Signed)
Pt in from home via [facility], per report pt had new onset "foggy headedness" today @ 11:00, pt hx of stroke, pt reported to take Plavix with dose being cut in half last week, pt has no facial droop, slurred speech, or weakness noted, pt ambulatory to stretcher with EMS, A&O x4, follows commands, speaks in complete sentences

## 2015-03-12 NOTE — ED Provider Notes (Signed)
CSN: [id number]     Arrival date & time 03/12/15  1303 History   First MD Initiated Contact with Patient 03/12/15 1305     Chief Complaint  Patient presents with  . Dizziness     (Consider location/radiation/quality/duration/timing/severity/associated sxs/prior Treatment) Patient is a 70 y.o. female presenting with neurologic complaint. The history is provided by the patient.  Neurologic Problem This is a new problem. The current episode started 3 to 5 hours ago. The problem occurs rarely. The problem has been resolved. Associated symptoms comments: Felt uncoordinated, speech symptoms, dizziness. Nothing aggravates the symptoms. Nothing relieves the symptoms. She has tried nothing for the symptoms.    Past Medical History  Diagnosis Date  . Ventricular hypertrophy 04/2009    LVH with diastolic dysfunction by echo. Has normal EF.  Marland Kitchen Pancreatitis     x2  . PAC (premature atrial contraction)   . TIA (transient ischemic attack)     She was hospitalized 04-23-09 through 04-27-09 for involving right side of the body  . Hypertension   . Tobacco abuse     Ongoing   . Edema of foot     She has a history of chronic edema of the left dated back to age 47 when she suufered severe frostbite playing  on the snow as a child  . Coronary artery disease 1996    Known with prior mild lesion of LAD demonstrated by Cardiac Catheterization in 1996  . COPD (chronic obstructive pulmonary disease) ([place])   . Shortness of breath dyspnea     since stroke 2 months ago -  . 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  Marland Kitchen PONV (postoperative nausea and vomiting)     problems waking up last time 4/16 only time  . 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 Vesicovaginal fistula closure  w/ tah  25 yrs ago  . Neck surgery  50 yrs ago    Left side tumor  . US echocardiography  04-26-2009    EF 65-70%  . Cardiovascular stress test  12-02-2001    EF 70%  . Cardiac catheterization  1996    Mild CAD with vasospasm  . Abdominal hysterectomy    . Laparoscopy    . Radiology with anesthesia N/A 05/25/2014    Procedure: RADIOLOGY WITH ANESTHESIA;  Surgeon: [name], MD;  Location: [place];  Service: Radiology;  Laterality: N/A;  . Appendectomy    . Radiology with anesthesia N/A 08/12/2014    Procedure: RADIOLOGY WITH ANESTHESIA;  Surgeon: [name], MD;  Location: [place];  Service: Radiology;  Laterality: N/A;  . Endarterectomy Right 08/12/2014    Procedure: RIGHT  COMMON CAROTID ARTERY EXPOSURE FOR INTERVENTIONAL RADIOLOGY PROCEDURE BY DR.DEVASHWAR,Insertion 6 FR sheath;  Surgeon: Elam Dutch, MD;  Location: [facility] OR;  Service: Vascular;  Laterality: Right;  . Endarterectomy Right 08/12/2014    Procedure:  CAROTID  EXPOSURE CLOSURE RIGHT NECK, REPAIR RIGHT COMMON CAROTID ARTERY;  Surgeon: Elam Dutch, MD;  Location: [place];  Service: Vascular;  Laterality: Right;  . Endarterectomy Left 11/18/2014    Procedure: CAROTID EXPOSURE;  Surgeon: Elam Dutch, MD;  Location: [place];  Service: Vascular;  Laterality: Left;  .  Endarterectomy Left 11/18/2014    Procedure: CLOSURE CAROTID;  Surgeon: Elam Dutch, MD;  Location: [facility] OR;  Service: Vascular;  Laterality: Left;   Family History  Problem Relation Age of Onset  . Emphysema Father   . Aneurysm Sister   . Stroke Mother   . Hypertension Mother   . Hypertension Daughter   . Thyroid disease Daughter    Social History  Substance Use Topics  . Smoking status: Former Smoker -- 1.00 packs/day for 30 years    Types: Cigarettes    Quit date: 04/26/2014  . Smokeless tobacco: Never Used  . Alcohol Use: No   OB History    No data available     Review of Systems  All other systems reviewed and are  negative.     Allergies  Dilaudid; Codeine; Hydromorphone hcl; Latex; and Sulfa drugs cross reactors  Home Medications   Prior to Admission medications   Medication Sig Start Date End Date Taking? Authorizing Provider  albuterol (PROVENTIL HFA;VENTOLIN HFA) 108 (90 BASE) MCG/ACT inhaler Inhale 1-2 puffs into the lungs every 6 (six) hours as needed for wheezing or shortness of breath.   Yes Historical Provider, MD  aspirin 81 MG tablet Take 81 mg by mouth daily.   Yes Historical Provider, MD  atorvastatin (LIPITOR) 40 MG tablet Take 1 tablet (40 mg total) by mouth daily at 6 PM. 05/27/14  Yes Darlin Coco, MD  clopidogrel (PLAVIX) 75 MG tablet Take 37.5 mg by mouth daily. For 7 days   Yes Historical Provider, MD  lisinopril-hydrochlorothiazide (PRINZIDE,ZESTORETIC) 20-12.5 MG tablet TAKE 1 TABLET BY MOUTH DAILY. 02/17/15  Yes Darlin Coco, MD  TOPROL XL 50 MG 24 hr tablet TAKE 1/2 TABLET BY MOUTH TWICE DAILY 02/17/15  Yes Darlin Coco, MD  clopidogrel (PLAVIX) 75 MG tablet TAKE 1 TABLET BY MOUTH DAILY. 05/27/14   Darlin Coco, MD   BP 140/75 mmHg  Pulse 60  Temp(Src) 97.7 F (36.5 C) (Oral)  Resp 20  SpO2 99% Physical Exam  Constitutional: She is oriented to person, place, and time. She appears well-developed and well-nourished. No distress.  HENT:  Head: Normocephalic.  Eyes: Conjunctivae are normal.  Neck: Neck supple. No tracheal deviation present.  Cardiovascular: Normal rate and regular rhythm.   Pulmonary/Chest: Effort normal. No respiratory distress.  Abdominal: Soft. She exhibits no distension.  Neurological: She is alert and oriented to person, place, and time. No cranial nerve deficit. Coordination and gait normal.  Normal finger to nose, slowed on right with 4/5 UE weakness that pt states is from previous stroke and not new  Skin: Skin is warm and dry.  Psychiatric: She has a normal mood and affect.    ED Course  Procedures (including critical care  time) Labs Review Labs Reviewed  CBC - Abnormal; Notable for the following:    HCT 35.7 (*)    Platelets 125 (*)    All other components within normal limits  COMPREHENSIVE METABOLIC PANEL - Abnormal; Notable for the following:    Glucose, Bld 102 (*)    ALT 13 (*)    All other components within normal limits  I-STAT CHEM 8, ED - Abnormal; Notable for the following:    Chloride 99 (*)    Calcium, Ion 1.09 (*)    All other components within normal limits  ETHANOL  PROTIME-INR  APTT  DIFFERENTIAL  URINE RAPID DRUG SCREEN, HOSP PERFORMED  URINALYSIS, ROUTINE W REFLEX MICROSCOPIC (NOT AT [facility])  Randolm Idol, ED  Imaging Review Dg Chest 2 View  03/12/2015  CLINICAL DATA:  TIA/possible stroke. Three brain aneurysms 3 prior surgeries. EXAM: CHEST  2 VIEW COMPARISON:  11/19/2014 and 11/22/2014 FINDINGS: Lungs are adequately inflated without focal consolidation or effusion. There is mild stable cardiomegaly. There is minimal calcified plaque over the thoracic aorta. There are mild degenerative changes of the spine. IMPRESSION: No active cardiopulmonary disease. Mild stable cardiomegaly. Electronically Signed   By: Marin Olp M.D.   On: 03/12/2015 21:25   Ct Head Wo Contrast  03/12/2015  CLINICAL DATA:  Change in coordination.  History of stroke EXAM: CT HEAD WITHOUT CONTRAST TECHNIQUE: Contiguous axial images were obtained from the base of the skull through the vertex without intravenous contrast. COMPARISON:  03/01/2015 FINDINGS: Skull and Sinuses:Negative for fracture or destructive process. The visualized mastoids, middle ears, and imaged paranasal sinuses are clear. Visualized orbits: Negative. Brain: No evidence of acute infarction, hemorrhage, hydrocephalus, or mass lesion/mass effect. Left A1 and right supraclinoid ICA aneurysms which have encountered with stents. There has been recent CTA evaluation which shows continued opacification of these aneurysm sacs. There is no evidence  of subarachnoid hemorrhage or brain edema. Advanced chronic small vessel disease with confluent ischemic gliosis throughout the deep cerebral white matter and lacunar infarcts in the bilateral corona radiata and right centrum semiovale. Generalized atrophy. IMPRESSION: 1. No acute finding or change from prior. 2. Advanced chronic small vessel disease. 3. Treated intracranial aneurysms, reference CTA 03/01/2015. Electronically Signed   By: Monte Fantasia M.D.   On: 03/12/2015 15:46   I have personally reviewed and evaluated these images and lab results as part of my medical decision-making.   EKG Interpretation   Date/Time:  Friday March 12 2015 13:11:27 EST Ventricular Rate:  55 PR Interval:  147 QRS Duration: 113 QT Interval:  443 QTC Calculation: 424 R Axis:   27 Text Interpretation:  Sinus rhythm Atrial premature complexes Nonspecific  T abnormalities, lateral leads No significant change since last tracing  Confirmed by Amariyon Maynes MD, Quillian Quince [id number]) on 03/12/2015 1:24:34 PM      MDM   Final diagnoses:  Transient cerebral ischemia, unspecified transient cerebral ischemia type    70 y.o. female presents with feeling dizzy acutely and feeling like she was uncoordinated 3 hours ago. Resolved spontaneously. Code stroke not activated as Pt not having new deficits and no active symptoms. D/w neurology who is recommending TIA workup and medical admission for monitoring. Hospitalist was consulted for admission and will see the patient in the emergency department.     Leo Grosser, MD 03/13/15 Carollee Massed

## 2015-03-12 NOTE — Consult Note (Signed)
NEURO HOSPITALIST CONSULT NOTE   Requestig physician: Dr. Laneta Simmers   Reason for Consult: transient fuzzy headed feeling  HPI:                                                                                                                                          Jeanne Lawson is an 70 y.o. female with multiple stroke risk factors who was at home today when at 46 "she felt fuzzy headed and not right. Daughter noted she was having a hard time getting her thoughts out". This lasted until 12. She was brought to the hospital for fear of her having a stroke. Currently symptoms have fully cleared.   Past Medical History  Diagnosis Date  . Ventricular hypertrophy 04/2009    LVH with diastolic dysfunction by echo. Has normal EF.  Marland Kitchen Pancreatitis     x2  . PAC (premature atrial contraction)   . TIA (transient ischemic attack)     She was hospitalized 04-23-09 through 04-27-09 for involving right side of the body  . Hypertension   . Tobacco abuse     Ongoing   . Edema of foot     She has a history of chronic edema of the left dated back to age 42 when she suufered severe frostbite playing  on the snow as a child  . Coronary artery disease 1996    Known with prior mild lesion of LAD demonstrated by Cardiac Catheterization in 1996  . COPD (chronic obstructive pulmonary disease) ([place])   . Shortness of breath dyspnea     since stroke 2 months ago -  . 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  Marland Kitchen PONV (postoperative nausea and vomiting)     problems waking up last time 4/16 only time  . 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 Neck surgery  50 yrs ago    Left side tumor  . US echocardiography   04-26-2009    EF 65-70%  . Cardiovascular stress test  12-02-2001    EF 70%  . Cardiac catheterization  1996    Mild CAD with vasospasm  . Abdominal hysterectomy    . Laparoscopy    . Radiology with anesthesia N/A 05/25/2014    Procedure: RADIOLOGY WITH ANESTHESIA;  Surgeon: [name], MD;  Location: [place];  Service: Radiology;  Laterality: N/A;  . Appendectomy    . Radiology with anesthesia N/A  08/12/2014    Procedure: RADIOLOGY WITH ANESTHESIA;  Surgeon: [name], MD;  Location: [place];  Service: Radiology;  Laterality: N/A;  . Endarterectomy Right 08/12/2014    Procedure: RIGHT  COMMON CAROTID ARTERY EXPOSURE FOR INTERVENTIONAL RADIOLOGY PROCEDURE BY DR.DEVASHWAR,Insertion 6 FR sheath;  Surgeon: Elam Dutch, MD;  Location: [facility] OR;  Service: Vascular;  Laterality: Right;  . Endarterectomy Right 08/12/2014    Procedure:  CAROTID  EXPOSURE CLOSURE RIGHT NECK, REPAIR RIGHT COMMON CAROTID ARTERY;  Surgeon: Elam Dutch, MD;  Location: [place];  Service: Vascular;  Laterality: Right;  . Endarterectomy Left 11/18/2014    Procedure: CAROTID EXPOSURE;  Surgeon: Elam Dutch, MD;  Location: [place];  Service: Vascular;  Laterality: Left;  . Endarterectomy Left 11/18/2014    Procedure: CLOSURE CAROTID;  Surgeon: Elam Dutch, MD;  Location: [facility] OR;  Service: Vascular;  Laterality: Left;    Family History  Problem Relation Age of Onset  . Emphysema Father   . Aneurysm Sister   . Stroke Mother   . Hypertension Mother   . Hypertension Daughter   . Thyroid disease Daughter       Social History:  reports that she quit smoking about 10 months ago. Her smoking use included Cigarettes. She has a 30 pack-year smoking history. She has never used smokeless tobacco. She reports that she does not drink alcohol or use illicit drugs.  Allergies  Allergen Reactions  . Dilaudid [Hydromorphone] Swelling  . Codeine Nausea And Vomiting  . Hydromorphone Hcl Swelling  . Latex Swelling     gloves used at dental office caused lips to swell. Used different ones no problem. Never had any problem at hospital or anywhere else.  . Sulfa Drugs Cross Reactors Other (See Comments)    Unknown childhood reaction    MEDICATIONS:                                                                                                                     No current facility-administered medications for this encounter.   Current Outpatient Prescriptions  Medication Sig Dispense Refill  . albuterol (PROVENTIL HFA;VENTOLIN HFA) 108 (90 BASE) MCG/ACT inhaler Inhale 1-2 puffs into the lungs every 6 (six) hours as needed for wheezing or shortness of breath.    Marland Kitchen aspirin 81 MG tablet Take 81 mg by mouth daily.    Marland Kitchen atorvastatin (LIPITOR) 40 MG tablet Take 1 tablet (40 mg total) by mouth daily at 6 PM. 30 tablet 3  . clopidogrel (PLAVIX) 75 MG tablet Take 37.5 mg by mouth daily. For 7 days    . lisinopril-hydrochlorothiazide (PRINZIDE,ZESTORETIC) 20-12.5 MG tablet TAKE 1 TABLET BY MOUTH DAILY. 30 tablet 5  . TOPROL XL 50 MG 24 hr tablet TAKE 1/2 TABLET BY MOUTH TWICE DAILY 30 tablet 5  . clopidogrel (PLAVIX) 75 MG tablet TAKE 1 TABLET BY MOUTH DAILY. 30 tablet 5      ROS:  History obtained from the patient  General ROS: negative for - chills, fatigue, fever, night sweats, weight gain or weight loss Psychological ROS: negative for - behavioral disorder, hallucinations, memory difficulties, mood swings or suicidal ideation Ophthalmic ROS: negative for - blurry vision, double vision, eye pain or loss of vision ENT ROS: negative for - epistaxis, nasal discharge, oral lesions, sore throat, tinnitus or vertigo Allergy and Immunology ROS: negative for - hives or itchy/watery eyes Hematological and Lymphatic ROS: negative for - bleeding problems, bruising or swollen lymph  nodes Endocrine ROS: negative for - galactorrhea, hair pattern changes, polydipsia/polyuria or temperature intolerance Respiratory ROS: negative for - cough, hemoptysis, shortness of breath or wheezing Cardiovascular ROS: negative for - chest pain, dyspnea on exertion, edema or irregular heartbeat Gastrointestinal ROS: negative for - abdominal pain, diarrhea, hematemesis, nausea/vomiting or stool incontinence Genito-Urinary ROS: negative for - dysuria, hematuria, incontinence or urinary frequency/urgency Musculoskeletal ROS: negative for - joint swelling or muscular weakness Neurological ROS: as noted in HPI Dermatological ROS: negative for rash and skin lesion changes   Blood pressure 140/75, pulse 60, temperature 97.7 F (36.5 C), temperature source Oral, resp. rate 20, SpO2 99 %.   Neurologic Examination:                                                                                                      HEENT-  Normocephalic, no lesions, without obvious abnormality.  Normal external eye and conjunctiva.  Normal TM's bilaterally.  Normal auditory canals and external ears. Normal external nose, mucus membranes and septum.  Normal pharynx. Cardiovascular- S1, S2 normal, pulses palpable throughout   Lungs- chest clear, no wheezing, rales, normal symmetric air entry Abdomen- normal findings: bowel sounds normal Extremities- no edema Lymph-no adenopathy palpable Musculoskeletal-no joint tenderness, deformity or swelling Skin-warm and dry, no hyperpigmentation, vitiligo, or suspicious lesions  Neurological Examination Mental Status: Alert, oriented, thought content appropriate.  Speech fluent without evidence of aphasia.  Able to follow 3 step commands without difficulty. Cranial Nerves: II: Visual fields grossly normal, pupils equal, round, reactive to light and accommodation III,IV, VI: ptosis not present, extra-ocular motions intact bilaterally V,VII: smile symmetric, facial light  touch sensation normal bilaterally VIII: hearing normal bilaterally IX,X: uvula rises symmetrically XI: bilateral shoulder shrug XII: midline tongue extension Motor: Right : Upper extremity   5/5    Left:     Upper extremity   5-/5  Lower extremity   5/5     Lower extremity   5-/5 Tone and bulk:normal tone throughout; no atrophy noted Sensory: Pinprick and light touch intact throughout, bilaterally Deep Tendon Reflexes: 1+ and symmetric throughout Plantars: Right: downgoing   Left: downgoing Cerebellar: normal finger-to-nose,  and normal heel-to-shin test Gait: not tested      Lab Results: Basic Metabolic Panel:  Recent Labs Lab 03/12/15 1337 03/12/15 1349  NA 138 141  K 3.8 3.8  CL 103 99*  CO2 25  --   GLUCOSE 102* 98  BUN 8 9  CREATININE 0.85 0.80  CALCIUM 9.0  --     Liver Function Tests:  Recent Labs Lab 03/12/15 1337  AST 20  ALT 13*  ALKPHOS 42  BILITOT 0.9  PROT 6.9  ALBUMIN 3.5   No results for input(s): LIPASE, AMYLASE in the last 168 hours. No results for input(s): AMMONIA in the last 168 hours.  CBC:  Recent Labs Lab 03/12/15 1337 03/12/15 1349  WBC 4.0  --   NEUTROABS 2.6  --   HGB 12.1 12.2  HCT 35.7* 36.0  MCV 85.8  --   PLT 125*  --     Cardiac Enzymes: No results for input(s): CKTOTAL, CKMB, CKMBINDEX, TROPONINI in the last 168 hours.  Lipid Panel: No results for input(s): CHOL, TRIG, HDL, CHOLHDL, VLDL, LDLCALC in the last 168 hours.  CBG: No results for input(s): GLUCAP in the last 168 hours.  Microbiology: Results for orders placed or performed during the hospital encounter of 11/13/14  Surgical pcr screen     Status: None   Collection Time: 11/13/14 10:30 AM  Result Value Ref Range Status   MRSA, PCR NEGATIVE NEGATIVE Final   Staphylococcus aureus NEGATIVE NEGATIVE Final    Comment:        The Xpert SA Assay (FDA approved for NASAL specimens in patients over 74 years of age), is one component of a  comprehensive surveillance program.  Test performance has been validated by [facility] for patients greater than or equal to 17 year old. It is not intended to diagnose infection nor to guide or monitor treatment.     Coagulation Studies:  Recent Labs  03/12/15 1337  LABPROT 13.5  INR 1.01    Imaging: No results found.     Assessment and plan per attending neurologist  Etta Quill PA-C [facility] [phone]  03/12/2015, 2:31 PM   Assessment/Plan: 70 YO female with multiple stroke risk factors who had transient "fuzzy feeling and decreased though processing". Given her multiple risk factors she would benefit from TIA/CVA work up and EEG.   Recommend: 1. HgbA1c, fasting lipid panel 2. MRI, MRA  of the brain without contrast 3. PT consult, OT consult, Speech consult 4. Echocardiogram 5. Carotid dopplers 6. Prophylactic therapy-current antiplatelet regime 7. Risk factor modification 8. Telemetry monitoring 9. Frequent neuro checks 10 NPO until passes stroke swallow screen 11 please page stroke NP  Or  PA  Or MD  M-F from 8am -4 pm starting now by the stroke team at this point.   You can look them up on [email or web address]  Password [email or web address]  12--eeg

## 2015-03-12 NOTE — ED Notes (Signed)
Neurologist NP reqeusting to evaluate patient prior to going to CT.

## 2015-03-12 NOTE — H&P (Signed)
History and Physical  Jeanne Lawson [id number] DOB: 1946/01/10 DOA: 03/12/2015   PCP: [name], MD  Referring Physician: ED/ Dr. 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 At that time, the patient also felt that she had some left arm weakness which she states she has residual weakness from previous stroke. She denied visual changes, syncope, or other focal deficits. She stated that the episode lasted several minutes, but has resolved since arrival to the emergency department. The patient called her daughter at that time and her daughter felt that she was somewhat confused. EMS was activated and the patient was brought to the hospital. The patient has been compliant with all her medications without any new medications introduced. Patient denies fevers, chills, headache, chest pain, dyspnea, nausea, vomiting, diarrhea, abdominal pain, dysuria, hematuria  In the emergency department, BMP and CBC were essentially unremarkable. The patient has chronic thrombocytopenia which was stable. EKG showed sinus rhythm with nonspecific T wave change. Urine drug screen and urinalysis were unremarkable. CT of the brain was negative. Neurology was consulted to evaluate the patient and an MRI and MRA of the brain have been ordered. Assessment/Plan: Dysphasia -appreciate neurology evaluation; case was discussed with neurology  -Await MRI and MRA of the brain  -PT/OT/ST  -Hemoglobin A1c  -Lipid panel  -Echocardiogram  Hypertension  -Continue metoprolol succinate  -Hold lisinopril/HCTZ for now and resume in the morning  Hyperlipidemia  -Continue Lipitor  History of carotid/intracranial aneurysm status post coil embolization    -Patient has been instructed to continue aspirin and Plavix 37.5 mg daily  Coronary artery disease  -EKG without any concerning ischemic changes  -No angina  -Continue antiplatelet therapy, metoprolol succinate, and Lipitor  History of tobacco abuse/COPD -Patient quit approximately 1 year ago after 50 pack years  -Presently stable on room air  Thrombocytopenia -chronic -monitor for signs of bleeding     Past Medical History  Diagnosis Date  . Ventricular hypertrophy 04/2009    LVH with diastolic dysfunction by echo. Has normal EF.  Marland Kitchen Pancreatitis     x2  . PAC (premature atrial contraction)   . TIA (transient ischemic attack)     She was hospitalized 04-23-09 through 04-27-09 for involving right side of the body  . Hypertension   . Tobacco abuse     Ongoing   . Edema of foot     She has a history of chronic edema of the left dated back to age 43 when she suufered severe frostbite playing  on the snow as a child  . Coronary artery disease 1996    Known with prior mild lesion of LAD demonstrated by Cardiac Catheterization in 1996  . COPD (chronic obstructive pulmonary disease) ([place])   . Shortness of breath dyspnea     since stroke 2 months ago -  . 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  Marland Kitchen PONV (postoperative nausea and vomiting)     problems waking up last time 4/16 only time  . 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 Neck surgery  50 yrs ago    Left side tumor  . US echocardiography  04-26-2009    EF 65-70%  . Cardiovascular stress test  12-02-2001    EF 70%  . Cardiac catheterization  1996    Mild CAD with vasospasm  . Abdominal hysterectomy    . Laparoscopy    . Radiology with  anesthesia N/A 05/25/2014    Procedure: RADIOLOGY WITH ANESTHESIA;  Surgeon: [name], MD;  Location: [place];  Service: Radiology;  Laterality: N/A;  . Appendectomy    . Radiology with anesthesia N/A 08/12/2014    Procedure: RADIOLOGY WITH ANESTHESIA;  Surgeon: [name], MD;  Location: [place];  Service: Radiology;  Laterality: N/A;  . Endarterectomy Right 08/12/2014    Procedure: RIGHT  COMMON CAROTID ARTERY EXPOSURE FOR INTERVENTIONAL RADIOLOGY PROCEDURE BY DR.DEVASHWAR,Insertion 6 FR sheath;  Surgeon: Elam Dutch, MD;  Location: [facility] OR;  Service: Vascular;  Laterality: Right;  . Endarterectomy Right 08/12/2014    Procedure:  CAROTID  EXPOSURE CLOSURE RIGHT NECK, REPAIR RIGHT COMMON CAROTID ARTERY;  Surgeon: Elam Dutch, MD;  Location: [place];  Service: Vascular;  Laterality: Right;  . Endarterectomy Left 11/18/2014    Procedure: CAROTID EXPOSURE;  Surgeon: Elam Dutch, MD;  Location: [place];  Service: Vascular;  Laterality: Left;  . Endarterectomy Left 11/18/2014    Procedure: CLOSURE CAROTID;  Surgeon: Elam Dutch, MD;  Location: [place];  Service: Vascular;  Laterality: Left;   Social History:  reports that she quit smoking about 10 months ago. Her smoking use included Cigarettes. She has a 30 pack-year smoking history. She has never used smokeless tobacco. She reports that she does not drink alcohol or use illicit drugs.   Family History  Problem Relation Age of Onset  . Emphysema Father   . Aneurysm Sister   . Stroke Mother   . Hypertension Mother   . Hypertension Daughter   . Thyroid disease Daughter      Allergies  Allergen Reactions  . Dilaudid [Hydromorphone] Swelling  . Codeine Nausea And Vomiting  . Hydromorphone Hcl Swelling  . Latex Swelling    gloves used at dental office caused lips to swell. Used different ones no problem. Never had any problem at hospital or anywhere else.  . Sulfa Drugs Cross Reactors Other (See Comments)    Unknown  childhood reaction      Prior to Admission medications   Medication Sig Start Date End Date Taking? Authorizing Provider  albuterol (PROVENTIL HFA;VENTOLIN HFA) 108 (90 BASE) MCG/ACT inhaler Inhale 1-2 puffs into the lungs every 6 (six) hours as needed for wheezing or shortness of breath.   Yes Historical Provider, MD  aspirin 81 MG tablet Take 81 mg by mouth daily.   Yes Historical Provider, MD  atorvastatin (LIPITOR) 40 MG tablet Take 1 tablet (40 mg total) by mouth daily at 6 PM. 05/27/14  Yes Darlin Coco, MD  clopidogrel (PLAVIX) 75 MG tablet Take 37.5 mg by mouth daily. For 7 days   Yes Historical Provider, MD  lisinopril-hydrochlorothiazide (PRINZIDE,ZESTORETIC) 20-12.5 MG tablet TAKE 1 TABLET BY MOUTH DAILY. 02/17/15  Yes Darlin Coco, MD  TOPROL XL 50 MG 24 hr tablet TAKE 1/2 TABLET BY MOUTH TWICE DAILY 02/17/15  Yes Darlin Coco, MD  clopidogrel (PLAVIX) 75 MG tablet TAKE 1 TABLET BY MOUTH DAILY. 05/27/14   Marcello Moores  Brackbill, MD    Review of Systems:  Constitutional:  No weight loss, night sweats, Fevers, chills, fatigue.  Head&Eyes: No headache.  No vision loss.  No eye pain or scotoma ENT:  No Difficulty swallowing,Tooth/dental problems,Sore throat,  No ear ache, post nasal drip,  Cardio-vascular:  No chest pain, Orthopnea, PND, swelling in lower extremities,  dizziness, palpitations  GI:  No  abdominal pain, nausea, vomiting, diarrhea, loss of appetite, hematochezia, melena, heartburn, indigestion, Resp:  No shortness of breath with exertion or at rest. No cough. No coughing up of blood .No wheezing.No chest wall deformity  Skin:  no rash or lesions.  GU:  no dysuria, change in color of urine, no urgency or frequency. No flank pain.  Musculoskeletal:  No joint pain or swelling. No decreased range of motion. No back pain.  Psych:  No change in mood or affect. No depression or anxiety. Neurologic: No headache, no dysesthesia, no focal weakness, no vision loss.  No syncope  Physical Exam: Filed Vitals:   03/12/15 1302 03/12/15 1310 03/12/15 1312  BP: 134/73 140/75 140/75  Pulse: 61 53 60  Temp: 97.7 F (36.5 C)    TempSrc: Oral  Oral  Resp: 20 19 20   SpO2: 100% 99% 99%   General:  A&O x 3, NAD, nontoxic, pleasant/cooperative Head/Eye: No conjunctival hemorrhage, no icterus, [place]/AT, No nystagmus ENT:  No icterus,  No thrush, good dentition, no pharyngeal exudate Neck:  No masses, no lymphadenpathy, no bruits CV:  RRR, no rub, no gallop, no S3 Lung:  CTAB, good air movement, no wheeze, no rhonchi Abdomen: soft/NT, +BS, nondistended, no peritoneal signs Ext: No cyanosis, No rashes, No petechiae, No lymphangitis, No edema Neuro: CNII-XII intact, strength 4/5 in bilateral upper and lower extremities, no dysmetria  Labs on Admission:  Basic Metabolic Panel:  Recent Labs Lab 03/12/15 1337 03/12/15 1349  NA 138 141  K 3.8 3.8  CL 103 99*  CO2 25  --   GLUCOSE 102* 98  BUN 8 9  CREATININE 0.85 0.80  CALCIUM 9.0  --    Liver Function Tests:  Recent Labs Lab 03/12/15 1337  AST 20  ALT 13*  ALKPHOS 42  BILITOT 0.9  PROT 6.9  ALBUMIN 3.5   No results for input(s): LIPASE, AMYLASE in the last 168 hours. No results for input(s): AMMONIA in the last 168 hours. CBC:  Recent Labs Lab 03/12/15 1337 03/12/15 1349  WBC 4.0  --   NEUTROABS 2.6  --   HGB 12.1 12.2  HCT 35.7* 36.0  MCV 85.8  --   PLT 125*  --    Cardiac Enzymes: No results for input(s): CKTOTAL, CKMB, CKMBINDEX, TROPONINI in the last 168 hours. BNP: Invalid input(s): POCBNP CBG: No results for input(s): GLUCAP in the last 168 hours.  Radiological Exams on Admission: Ct Head Wo Contrast  03/12/2015  CLINICAL DATA:  Change in coordination.  History of stroke EXAM: CT HEAD WITHOUT CONTRAST TECHNIQUE: Contiguous axial images were obtained from the base of the skull through the vertex without intravenous contrast. COMPARISON:  03/01/2015 FINDINGS: Skull and  Sinuses:Negative for fracture or destructive process. The visualized mastoids, middle ears, and imaged paranasal sinuses are clear. Visualized orbits: Negative. Brain: No evidence of acute infarction, hemorrhage, hydrocephalus, or mass lesion/mass effect. Left A1 and right supraclinoid ICA aneurysms which have encountered with stents. There has been recent CTA evaluation which shows continued opacification of these aneurysm sacs. There is no evidence of subarachnoid hemorrhage or brain  edema. Advanced chronic small vessel disease with confluent ischemic gliosis throughout the deep cerebral white matter and lacunar infarcts in the bilateral corona radiata and right centrum semiovale. Generalized atrophy. IMPRESSION: 1. No acute finding or change from prior. 2. Advanced chronic small vessel disease. 3. Treated intracranial aneurysms, reference CTA 03/01/2015. Electronically Signed   By: Monte Fantasia M.D.   On: 03/12/2015 15:46    EKG: Independently reviewed. Sinus with nonspecific Twave changes    Time spent:60 minutes Code Status:   FULL Family Communication:   Daughter updated at bedside   Luree Palla, DO  [facility] Pager [phone]  If 7PM-7AM, please contact night-coverage [email or web address] Password [facility] 03/12/2015, 5:44 PM

## 2015-03-12 NOTE — ED Notes (Signed)
Attempted report X2 

## 2015-03-13 ENCOUNTER — Observation Stay (HOSPITAL_COMMUNITY): Payer: Medicare Other

## 2015-03-13 ENCOUNTER — Observation Stay (HOSPITAL_BASED_OUTPATIENT_CLINIC_OR_DEPARTMENT_OTHER): Payer: Medicare Other

## 2015-03-13 ENCOUNTER — Observation Stay (HOSPITAL_COMMUNITY)
Admit: 2015-03-13 | Discharge: 2015-03-13 | Disposition: A | Discharge status: Home / Self Care | Payer: Medicare Other | Attending: Emergency Medicine | Admitting: Emergency Medicine

## 2015-03-13 DIAGNOSIS — G459 Transient cerebral ischemic attack, unspecified: Secondary | ICD-10-CM | POA: Diagnosis not present

## 2015-03-13 DIAGNOSIS — R4182 Altered mental status, unspecified: Secondary | ICD-10-CM | POA: Diagnosis not present

## 2015-03-13 DIAGNOSIS — D696 Thrombocytopenia, unspecified: Secondary | ICD-10-CM | POA: Diagnosis not present

## 2015-03-13 DIAGNOSIS — I10 Essential (primary) hypertension: Secondary | ICD-10-CM | POA: Diagnosis not present

## 2015-03-13 LAB — LIPID PANEL
CHOL/HDL RATIO: 2.4 ratio
CHOLESTEROL: 158 mg/dL (ref 0–200)
HDL: 66 mg/dL (ref >40)
LDL CALC: 63 mg/dL (ref 0–99)
Triglycerides: 145 mg/dL (ref <150)
VLDL: 29 mg/dL (ref 0–40)

## 2015-03-13 MED ORDER — LORAZEPAM 2 MG/ML IJ SOLN
2.0000 mg | Freq: Once | INTRAMUSCULAR | Status: AC
  Administered 2015-03-13: 2 mg via INTRAVENOUS
  Filled 2015-03-13: qty 1

## 2015-03-13 MED ORDER — CLOPIDOGREL BISULFATE 75 MG PO TABS
37.5000 mg | ORAL_TABLET | Freq: Every day | ORAL | Status: DC
  Administered 2015-03-13 – 2015-03-17 (×5): 37.5 mg via ORAL
  Filled 2015-03-13 (×4): qty 1

## 2015-03-13 NOTE — Progress Notes (Signed)
EEG completed, results pending. 

## 2015-03-13 NOTE — Progress Notes (Signed)
Interval History:                                                                                                                      Jeanne Lawson is an 70 y.o. female patient who presented for evaluation of her possible altered mental status. MRI of studies could not be done as patient was severely claustrophobic. She is in fact sedated now due to the Ativan given to her for the MRI study, but she apparently was very claustrophobic during the study and hence it could not be started.    Past Medical History: Past Medical History  Diagnosis Date  . Ventricular hypertrophy 04/2009    LVH with diastolic dysfunction by echo. Has normal EF.  Marland Kitchen Pancreatitis     x2  . PAC (premature atrial contraction)   . TIA (transient ischemic attack)     She was hospitalized 04-23-09 through 04-27-09 for involving right side of the body  . Hypertension   . Tobacco abuse     Ongoing   . Edema of foot     She has a history of chronic edema of the left dated back to age 70 when she suufered severe frostbite playing  on the snow as a child  . Coronary artery disease 1996    Known with prior mild lesion of LAD demonstrated by Cardiac Catheterization in 1996  . COPD (chronic obstructive pulmonary disease) ([place])   . Shortness of breath dyspnea     since stroke 2 months ago -  . 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  Marland Kitchen PONV (postoperative nausea and vomiting)     problems waking up last time 4/16 only time  . 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 Neck surgery  50 yrs ago    Left side tumor  . US echocardiography  04-26-2009    EF 65-70%  . Cardiovascular stress test  12-02-2001    EF 70%  . Cardiac catheterization   1996    Mild CAD with vasospasm  . Abdominal hysterectomy    . Laparoscopy    . Radiology with anesthesia N/A 05/25/2014    Procedure: RADIOLOGY WITH ANESTHESIA;  Surgeon: [name], MD;  Location: [place];  Service: Radiology;  Laterality: N/A;  . Appendectomy    . Radiology with anesthesia N/A 08/12/2014    Procedure: RADIOLOGY WITH ANESTHESIA;  Surgeon: [name], MD;  Location: [place];  Service: Radiology;  Laterality: N/A;  . Endarterectomy Right 08/12/2014    Procedure: RIGHT  COMMON CAROTID ARTERY EXPOSURE FOR INTERVENTIONAL RADIOLOGY PROCEDURE BY DR.DEVASHWAR,Insertion 6 FR sheath;  Surgeon: Elam Dutch, MD;  Location: [facility] OR;  Service: Vascular;  Laterality: Right;  . Endarterectomy Right 08/12/2014    Procedure:  CAROTID  EXPOSURE CLOSURE RIGHT NECK, REPAIR RIGHT COMMON CAROTID ARTERY;  Surgeon: Elam Dutch, MD;  Location: [place];  Service: Vascular;  Laterality: Right;  . Endarterectomy Left 11/18/2014    Procedure: CAROTID EXPOSURE;  Surgeon: Elam Dutch, MD;  Location: [place];  Service: Vascular;  Laterality: Left;  . Endarterectomy Left 11/18/2014    Procedure: CLOSURE CAROTID;  Surgeon: Elam Dutch, MD;  Location: [place];  Service: Vascular;  Laterality: Left;    Family History: Family History  Problem Relation Age of Onset  . Emphysema Father   . Aneurysm Sister   . Stroke Mother   . Hypertension Mother   . Hypertension Daughter   . Thyroid disease Daughter     Social History:   reports that she quit smoking about 10 months ago. Her smoking use included Cigarettes. She has a 30 pack-year smoking history. She has never used smokeless tobacco. She reports that she does not drink alcohol or use illicit drugs.  Allergies:  Allergies  Allergen Reactions  . Dilaudid [Hydromorphone] Swelling  . Codeine Nausea And Vomiting  . Hydromorphone Hcl Swelling  . Latex Swelling    gloves used at dental office caused lips to swell. Used different ones no  problem. Never had any problem at hospital or anywhere else.  . Sulfa Drugs Cross Reactors Other (See Comments)    Unknown childhood reaction     Medications:                                                                                                                         Current facility-administered medications:  .  albuterol (PROVENTIL) (2.5 MG/3ML) 0.083% nebulizer solution 3 mL, 3 mL, Inhalation, Q6H PRN, Orson Eva, MD .  aspirin chewable tablet 81 mg, 81 mg, Oral, Daily, Orson Eva, MD, 81 mg at 03/13/15 0908 .  atorvastatin (LIPITOR) tablet 40 mg, 40 mg, Oral, q1800, Orson Eva, MD, 40 mg at 03/13/15 1717 .  clopidogrel (PLAVIX) tablet 37.5 mg, 37.5 mg, Oral, Daily, Orson Eva, MD, 37.5 mg at 03/13/15 1110 .  enoxaparin (LOVENOX) injection 40 mg, 40 mg, Subcutaneous, Q24H, Orson Eva, MD, 40 mg at 03/12/15 2311 .  metoprolol succinate (TOPROL-XL) 24 hr tablet 25 mg, 25 mg, Oral, BID, Orson Eva, MD, 25 mg at 03/13/15 0908 .  senna-docusate (Senokot-S) tablet 1 tablet, 1 tablet, Oral, QHS PRN, Orson Eva, MD   Neurologic Examination:  Blood pressure 108/58, pulse 62, temperature 98.5 F (36.9 C), temperature source Axillary, resp. rate 20, height 5\' 1"  (1.549 m), weight 72.167 kg (159 lb 1.6 oz), SpO2 94 %.  Patient sedated with ativan given to her for attempted MRI study.    Lab Results: Basic Metabolic Panel:  Recent Labs Lab 03/12/15 1337 03/12/15 1349  NA 138 141  K 3.8 3.8  CL 103 99*  CO2 25  --   GLUCOSE 102* 98  BUN 8 9  CREATININE 0.85 0.80  CALCIUM 9.0  --     Liver Function Tests:  Recent Labs Lab 03/12/15 1337  AST 20  ALT 13*  ALKPHOS 42  BILITOT 0.9  PROT 6.9  ALBUMIN 3.5   No results for input(s): LIPASE, AMYLASE in the last 168 hours. No results for input(s): AMMONIA in the last 168 hours.  CBC:  Recent Labs Lab 03/12/15 1337  03/12/15 1349  WBC 4.0  --   NEUTROABS 2.6  --   HGB 12.1 12.2  HCT 35.7* 36.0  MCV 85.8  --   PLT 125*  --     Cardiac Enzymes: No results for input(s): CKTOTAL, CKMB, CKMBINDEX, TROPONINI in the last 168 hours.  Lipid Panel:  Recent Labs Lab 03/13/15 0530  CHOL 158  TRIG 145  HDL 66  CHOLHDL 2.4  VLDL 29  LDLCALC 63    CBG: No results for input(s): GLUCAP in the last 168 hours.  Microbiology: Results for orders placed or performed during the hospital encounter of 11/13/14  Surgical pcr screen     Status: None   Collection Time: 11/13/14 10:30 AM  Result Value Ref Range Status   MRSA, PCR NEGATIVE NEGATIVE Final   Staphylococcus aureus NEGATIVE NEGATIVE Final    Comment:        The Xpert SA Assay (FDA approved for NASAL specimens in patients over 70 years of age), is one component of a comprehensive surveillance program.  Test performance has been validated by [facility] for patients greater than or equal to 79 year old. It is not intended to diagnose infection nor to guide or monitor treatment.     Imaging: Dg Chest 2 View  03/12/2015  CLINICAL DATA:  TIA/possible stroke. Three brain aneurysms 3 prior surgeries. EXAM: CHEST  2 VIEW COMPARISON:  11/19/2014 and 11/22/2014 FINDINGS: Lungs are adequately inflated without focal consolidation or effusion. There is mild stable cardiomegaly. There is minimal calcified plaque over the thoracic aorta. There are mild degenerative changes of the spine. IMPRESSION: No active cardiopulmonary disease. Mild stable cardiomegaly. Electronically Signed   By: Marin Olp M.D.   On: 03/12/2015 21:25   Ct Head Wo Contrast  03/12/2015  CLINICAL DATA:  Change in coordination.  History of stroke EXAM: CT HEAD WITHOUT CONTRAST TECHNIQUE: Contiguous axial images were obtained from the base of the skull through the vertex without intravenous contrast. COMPARISON:  03/01/2015 FINDINGS: Skull and Sinuses:Negative for fracture or  destructive process. The visualized mastoids, middle ears, and imaged paranasal sinuses are clear. Visualized orbits: Negative. Brain: No evidence of acute infarction, hemorrhage, hydrocephalus, or mass lesion/mass effect. Left A1 and right supraclinoid ICA aneurysms which have encountered with stents. There has been recent CTA evaluation which shows continued opacification of these aneurysm sacs. There is no evidence of subarachnoid hemorrhage or brain edema. Advanced chronic small vessel disease with confluent ischemic gliosis throughout the deep cerebral white matter and lacunar infarcts in the bilateral corona radiata and right centrum semiovale. Generalized  atrophy. IMPRESSION: 1. No acute finding or change from prior. 2. Advanced chronic small vessel disease. 3. Treated intracranial aneurysms, reference CTA 03/01/2015. Electronically Signed   By: Monte Fantasia M.D.   On: 03/12/2015 15:46    Assessment and plan:   Jeanne Lawson is an 70 y.o. female patient who presented for further evaluation of episode of altered mental status as described in consultation note. MRI brain and MRA of the head is still pending. EEG did not show any evidence of seizures, but a few intermittent abnormal discharges in the left temporal region, could suggest an underlying area of brain irritability in the left temporal area.  Recommend starting seizure medication to prevent seizures, given the intracranial pathology with known aneurysms. Patient is sedated from ativan now. No family at bedside. Abnormal EEG findings need to be discussed with patient tomorrow morning, and will discuss further about sz medications. No e/o clinical seizures during the hospitalization.  Will follow up.

## 2015-03-13 NOTE — Progress Notes (Addendum)
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 At that time, the patient also felt that she had some left arm weakness which she states she has residual weakness from previous stroke. She denied visual changes, syncope, or other focal deficits. She stated that the episode lasted over an hour but has resolved upon arrival to the emergency department. CT of brain was negative. Neurology was consulted.  Assessment/Plan: Dysphasia -appreciate neurology evaluation; case was discussed with neurology 03/12/15 -Await MRI and MRA of the brain--could not tolerate MRI last night--pt needed more Ativan-->repeat with Ativan 2mg  -PT/OT/ST  -Hemoglobin A1c--pending  -LDL 72 -Echocardiogram --EF 60-65%, grade 1 DD, trivial TR, normal RV Hypertension  -Continue metoprolol succinate 25 mg bid -Hold lisinopril/HCTZ for now--blood pressure controlled Hyperlipidemia  -Continue Lipitor  History of carotid/intracranial aneurysm status post coil embolization  -Patient has been instructed to continue aspirin and Plavix 37.5 mg daily  Coronary artery disease  -EKG without any concerning ischemic changes  -No angina  -Continue antiplatelet therapy, metoprolol succinate, and Lipitor  History of tobacco abuse/COPD -Patient quit approximately 1 year ago after 50 pack years  -Presently stable on room air  Thrombocytopenia -chronic -monitor for signs of bleeding Leg pain and edema -duplex r/o DVT -urine protein creatinine ratio   Family Communication: Daughter updated at beside 2/18 Disposition Plan: Home when cleared by  neurology   Procedures/Studies: Ct Angio Head W/cm &/or Wo Cm  03/01/2015  CLINICAL DATA:  Three-month follow-up of pipeline stent for treatment of aneurysm. EXAM: CT ANGIOGRAPHY HEAD AND NECK TECHNIQUE: Multidetector CT imaging of the head and neck was performed using the standard protocol during bolus administration of intravenous contrast. Multiplanar CT image reconstructions and MIPs were obtained to evaluate the vascular anatomy. Carotid stenosis measurements (when applicable) are obtained utilizing NASCET criteria, using the distal internal carotid diameter as the denominator. CONTRAST:  65mL OMNIPAQUE IOHEXOL 350 MG/ML SOLN COMPARISON:  Angiography 11/18/2014 FINDINGS: CT HEAD The brainstem and cerebellum are normal. Pipeline stent devices are present in the anterior circulation at the skullbase bilaterally. Extensive chronic small vessel ischemic changes are again seen throughout the cerebral hemispheric white matter. No large vessel territory no hydrocephalus or extra-axial fluid. The calvarium is unremarkable. Infarction. CTA NECK Aortic arch: Atherosclerosis of the aorta but no aneurysm or dissection. Branching pattern of the brachiocephalic vessels from the arch is normal with a common origin of the innominate artery and left common carotid artery. Right carotid system: Common carotid artery widely patent to the bifurcation region. Mild atherosclerosis of the carotid bifurcation but no stenosis of the ICA lumen beyond that of the more distal normal cervical ICA and therefore no stenosis. Fusiform aneurysmal dilatation of the ICA proximal to the skullbase with maximal diameter of 8.5 mm. Similar the previous angiogram images. Left carotid system: Common carotid artery widely patent to the bifurcation region. Mild atherosclerotic disease at the carotid bifurcation and proximal ICA bulb but no stenosis. More distal cervical ICA is within normal limits. Vertebral arteries:Both vertebral artery origins  are widely patent. Both vertebral arteries widely patent through the cervical region without evidence of focal stenosis or dissection. Skeleton: Mild spondylosis Other neck: No soft tissue neck lesion.  Upper lungs  are clear. CTA HEAD Anterior circulation: Right pipeline stent beginning at the apex of the siphon and extending into the right M1 segment. Aneurysm of the supra clinoid ICA projecting posteriorly shows flow, with maximal diameter of 8.6 mm. Anterior and middle cerebral arteries in the branch vessels appear otherwise normal. Pipeline stent on the left begins at the junction of the siphon in the supra clinoid ICA an extends into the M1 segment. Aneurysm projecting posteriorly from the site of origin of the anterior cerebral artery shows flow, measuring maximally 6.9 mm in diameter. More distal branch vessels are patent. Posterior circulation: Both vertebral arteries are patent to the basilar with the left being dominant. No basilar stenosis. Posterior circulation branch vessels are normal. Venous sinuses: Patent and normal Anatomic variants: None significant Delayed phase: No abnormal enhancement IMPRESSION: No atherosclerotic disease at the carotid bifurcations of significance. 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 Bilateral pipeline stent placement. Persistent contrast opacification of the right supra clinoid ICA aneurysm with maximal diameter measuring 8.6 mm. Persisting contrast opacification of the left A1 aneurysm with maximal transverse diameter of 6.9 mm. Electronically Signed   By: Nelson Chimes M.D.   On: 03/01/2015 15:04   Dg Chest 2 View  03/12/2015  CLINICAL DATA:  TIA/possible stroke. Three brain aneurysms 3 prior surgeries. EXAM: CHEST  2 VIEW COMPARISON:  11/19/2014 and 11/22/2014 FINDINGS: Lungs are adequately inflated without focal consolidation or effusion. There is mild stable cardiomegaly. There is minimal  calcified plaque over the thoracic aorta. There are mild degenerative changes of the spine. IMPRESSION: No active cardiopulmonary disease. Mild stable cardiomegaly. Electronically Signed   By: Marin Olp M.D.   On: 03/12/2015 21:25   Ct Head Wo Contrast  03/12/2015  CLINICAL DATA:  Change in coordination.  History of stroke EXAM: CT HEAD WITHOUT CONTRAST TECHNIQUE: Contiguous axial images were obtained from the base of the skull through the vertex without intravenous contrast. COMPARISON:  03/01/2015 FINDINGS: Skull and Sinuses:Negative for fracture or destructive process. The visualized mastoids, middle ears, and imaged paranasal sinuses are clear. Visualized orbits: Negative. Brain: No evidence of acute infarction, hemorrhage, hydrocephalus, or mass lesion/mass effect. Left A1 and right supraclinoid ICA aneurysms which have encountered with stents. There has been recent CTA evaluation which shows continued opacification of these aneurysm sacs. There is no evidence of subarachnoid hemorrhage or brain edema. Advanced chronic small vessel disease with confluent ischemic gliosis throughout the deep cerebral white matter and lacunar infarcts in the bilateral corona radiata and right centrum semiovale. Generalized atrophy. IMPRESSION: 1. No acute finding or change from prior. 2. Advanced chronic small vessel disease. 3. Treated intracranial aneurysms, reference CTA 03/01/2015. Electronically Signed   By: Monte Fantasia M.D.   On: 03/12/2015 15:46   Ct Angio Neck W/cm &/or Wo/cm  03/01/2015  CLINICAL DATA:  Three-month follow-up of pipeline stent for treatment of aneurysm. EXAM: CT ANGIOGRAPHY HEAD AND NECK TECHNIQUE: Multidetector CT imaging of the head and neck was performed using the standard protocol during bolus administration of intravenous contrast. Multiplanar CT image reconstructions and MIPs were obtained to evaluate the vascular anatomy. Carotid stenosis measurements (when applicable) are obtained  utilizing NASCET criteria, using the distal internal carotid diameter as the denominator. CONTRAST:  41mL OMNIPAQUE IOHEXOL 350 MG/ML SOLN COMPARISON:  Angiography 11/18/2014 FINDINGS: CT HEAD The brainstem and cerebellum are normal. Pipeline stent devices are present in the anterior circulation at the skullbase bilaterally. Extensive chronic  small vessel ischemic changes are again seen throughout the cerebral hemispheric white matter. No large vessel territory no hydrocephalus or extra-axial fluid. The calvarium is unremarkable. Infarction. CTA NECK Aortic arch: Atherosclerosis of the aorta but no aneurysm or dissection. Branching pattern of the brachiocephalic vessels from the arch is normal with a common origin of the innominate artery and left common carotid artery. Right carotid system: Common carotid artery widely patent to the bifurcation region. Mild atherosclerosis of the carotid bifurcation but no stenosis of the ICA lumen beyond that of the more distal normal cervical ICA and therefore no stenosis. Fusiform aneurysmal dilatation of the ICA proximal to the skullbase with maximal diameter of 8.5 mm. Similar the previous angiogram images. Left carotid system: Common carotid artery widely patent to the bifurcation region. Mild atherosclerotic disease at the carotid bifurcation and proximal ICA bulb but no stenosis. More distal cervical ICA is within normal limits. Vertebral arteries:Both vertebral artery origins are widely patent. Both vertebral arteries widely patent through the cervical region without evidence of focal stenosis or dissection. Skeleton: Mild spondylosis Other neck: No soft tissue neck lesion.  Upper lungs are clear. CTA HEAD Anterior circulation: Right pipeline stent beginning at the apex of the siphon and extending into the right M1 segment. Aneurysm of the supra clinoid ICA projecting posteriorly shows flow, with maximal diameter of 8.6 mm. Anterior and middle cerebral arteries in the  branch vessels appear otherwise normal. Pipeline stent on the left begins at the junction of the siphon in the supra clinoid ICA an extends into the M1 segment. Aneurysm projecting posteriorly from the site of origin of the anterior cerebral artery shows flow, measuring maximally 6.9 mm in diameter. More distal branch vessels are patent. Posterior circulation: Both vertebral arteries are patent to the basilar with the left being dominant. No basilar stenosis. Posterior circulation branch vessels are normal. Venous sinuses: Patent and normal Anatomic variants: None significant Delayed phase: No abnormal enhancement IMPRESSION: No atherosclerotic disease at the carotid bifurcations of significance. 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 Bilateral pipeline stent placement. Persistent contrast opacification of the right supra clinoid ICA aneurysm with maximal diameter measuring 8.6 mm. Persisting contrast opacification of the left A1 aneurysm with maximal transverse diameter of 6.9 mm. Electronically Signed   By: Nelson Chimes M.D.   On: 03/01/2015 15:04   Ir Radiologist Eval & Mgmt  03/10/2015  EXAM: ESTABLISHED PATIENT OFFICE VISIT CHIEF COMPLAINT: Six-month follow-up following treatment of left internal carotid artery terminal aneurysm. 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 The patient is accompanied by her daughter and granddaughter. Clinically the patient reports no significant headaches, nausea, vomiting, visual symptoms, loss of consciousness, seizures or incoordination. 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 Otherwise the patient is at home and  able to cope independently. She does complain of increased bruisability especially over her torso. The patient however does report being depressed but without being weepy. This is corroborated by her daughter and granddaughter. Her appetite is normal. She has gained about 10 pounds since the last procedure. Otherwise she reports no recent chills, fever or rigors. On close questioning, the patient does report spells of bleeding from her nostrils  and also her mouth at least once. She denies any hematuria or black tarry stools. Her review of systems is essentially negative for pathologic symptomatology. Her present medications include albuterol inhaler, aspirin 81 mg, atorvastatin, Plavix, lisinopril hydrochlorothiazide combination, Toprol. Her allergies include Dilaudid, codeine, hydromorphone, latex, sulfa drugs. PHYSICAL EXAMINATION: Affect appropriate. In no acute distress. However does drag her left foot on walking with a cane. This apparently has remained stable since her most recent stroke. ASSESSMENT AND PLAN: The patient is awaiting the results of her CTA scan of her head and neck which she had just a few minutes ago. The results are not available at this time. She did develop extravasation at the site of the needle stick. This has been evaluated and treated with ice pack. Patient will undergo a P2Y12 study today. A follow-up MRI/MRA of the brain with sedation will be scheduled for 6 months time. The results of the CT angiogram done today will hopefully be available for review and reported to the patient. She has been asked to continue with active physical therapy as tolerated. She has scheduled an appointment with outpatient physical therapy. Questions were answered to her satisfaction. They were asked to call should they have any concerns or questions. Electronically Signed   By: [name] M.D.   On: 03/01/2015 15:01         Subjective: Patient denies fevers, chills, headache, chest pain,  dyspnea, nausea, vomiting, diarrhea, abdominal pain, dysuria, hematuria   Objective: Filed Vitals:   03/13/15 0240 03/13/15 0420 03/13/15 0648 03/13/15 1336  BP: 118/83 111/51 123/55 114/75  Pulse: 72 59 68 64  Temp: 98.4 F (36.9 C) 98.1 F (36.7 C) 98.5 F (36.9 C) 97 F (36.1 C)  TempSrc: Oral Oral Oral Oral  Resp: 19 16 18 18   Height:   5\' 1"  (1.549 m)   Weight:   72.167 kg (159 lb 1.6 oz)   SpO2: 96% 95% 94% 98%   No intake or output data in the 24 hours ending 03/13/15 1720 Weight change:  Exam:   General:  Pt is alert, follows commands appropriately, not in acute distress  HEENT: No icterus, No thrush, No neck mass, [place]/AT  Cardiovascular: RRR, S1/S2, no rubs, no gallops  Respiratory: CTA bilaterally, no wheezing, no crackles, no rhonchi  Abdomen: Soft/+BS, non tender, non distended, no guarding  Extremities: 1+LE edema, No lymphangitis, No petechiae, No rashes, no synovitis  Data Reviewed: Basic Metabolic Panel:  Recent Labs Lab 03/12/15 1337 03/12/15 1349  NA 138 141  K 3.8 3.8  CL 103 99*  CO2 25  --   GLUCOSE 102* 98  BUN 8 9  CREATININE 0.85 0.80  CALCIUM 9.0  --    Liver Function Tests:  Recent Labs Lab 03/12/15 1337  AST 20  ALT 13*  ALKPHOS 42  BILITOT 0.9  PROT 6.9  ALBUMIN 3.5   No results for input(s): LIPASE, AMYLASE in the last 168 hours. No results for input(s): AMMONIA in the last 168 hours. CBC:  Recent Labs Lab 03/12/15 1337 03/12/15 1349  WBC 4.0  --   NEUTROABS 2.6  --   HGB 12.1 12.2  HCT 35.7* 36.0  MCV 85.8  --   PLT 125*  --    Cardiac Enzymes: No results for input(s): CKTOTAL, CKMB, CKMBINDEX, TROPONINI in the last 168 hours. BNP: Invalid input(s): POCBNP CBG: No results for input(s): GLUCAP in the last 168 hours.  No results found for this or any previous visit (  from the past 240 hour(s)).   Scheduled Meds: . aspirin  81 mg Oral Daily  . atorvastatin  40 mg Oral q1800  . clopidogrel  37.5 mg Oral  Daily  . enoxaparin (LOVENOX) injection  40 mg Subcutaneous Q24H  . metoprolol succinate  25 mg Oral BID   Continuous Infusions:    Aidden Markovic, DO  [facility] Pager [phone]  If 7PM-7AM, please contact night-coverage [email or web address] Password [email or web address] 03/13/2015, 5:20 PM

## 2015-03-13 NOTE — Progress Notes (Signed)
Echocardiogram 2D Echocardiogram has been performed.  Jeanne Lawson 03/13/2015, 4:01 PM

## 2015-03-13 NOTE — Progress Notes (Signed)
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 At that time, the patient also felt that she had some left arm weakness which she states she has residual weakness from previous stroke. She denied visual changes, syncope, or other focal deficits. She stated that the episode lasted over an hour but has resolved upon arrival to the emergency department. CT of brain was negative. Neurology was consulted.  Assessment/Plan: Dysphasia -appreciate neurology evaluation; case was discussed with neurology 03/12/15 -Await MRI and MRA of the brain--could not tolerate MRI last night--pt needed more Ativan-->repeat with Ativan 2mg  -PT/OT/ST  -Hemoglobin A1c--pending  -LDL 72 -Echocardiogram --pending Hypertension  -Continue metoprolol succinate 25 mg bid -Hold lisinopril/HCTZ for now--blood pressure Hyperlipidemia  -Continue Lipitor  History of carotid/intracranial aneurysm status post coil embolization  -Patient has been instructed to continue aspirin and Plavix 37.5 mg daily  Coronary artery disease  -EKG without any concerning ischemic changes  -No angina  -Continue antiplatelet therapy, metoprolol succinate, and Lipitor  History of tobacco abuse/COPD -Patient quit approximately 1 year ago after 50 pack years  -Presently stable on room air  Thrombocytopenia -chronic -monitor for signs of bleeding   Family Communication:   Daughter updated at beside Disposition Plan:   Home when cleared by neurology       Procedures/Studies: Ct Angio Head W/cm &/or Wo Cm  03/01/2015  CLINICAL DATA:  Three-month follow-up of pipeline stent for  treatment of aneurysm. EXAM: CT ANGIOGRAPHY HEAD AND NECK TECHNIQUE: Multidetector CT imaging of the head and neck was performed using the standard protocol during bolus administration of intravenous contrast. Multiplanar CT image reconstructions and MIPs were obtained to evaluate the vascular anatomy. Carotid stenosis measurements (when applicable) are obtained utilizing NASCET criteria, using the distal internal carotid diameter as the denominator. CONTRAST:  71mL OMNIPAQUE IOHEXOL 350 MG/ML SOLN COMPARISON:  Angiography 11/18/2014 FINDINGS: CT HEAD The brainstem and cerebellum are normal. Pipeline stent devices are present in the anterior circulation at the skullbase bilaterally. Extensive chronic small vessel ischemic changes are again seen throughout the cerebral hemispheric white matter. No large vessel territory no hydrocephalus or extra-axial fluid. The calvarium is unremarkable. Infarction. CTA NECK Aortic arch: Atherosclerosis of the aorta but no aneurysm or dissection. Branching pattern of the brachiocephalic vessels from the arch is normal with a common origin of the innominate artery and left common carotid artery. Right carotid system: Common carotid artery widely patent to the bifurcation region. Mild atherosclerosis of the carotid bifurcation but no stenosis of the ICA lumen beyond that of the more distal normal cervical ICA and therefore no stenosis. Fusiform aneurysmal dilatation of the ICA proximal to the skullbase with maximal diameter of 8.5 mm. Similar the previous angiogram images. Left carotid system: Common carotid artery widely patent to the bifurcation region. Mild atherosclerotic disease at the carotid bifurcation and proximal ICA bulb but no stenosis. More distal cervical ICA is within normal limits. Vertebral arteries:Both vertebral artery origins are widely patent. Both vertebral arteries widely patent through the cervical region without evidence of focal stenosis or dissection.  Skeleton: Mild spondylosis Other neck: No soft tissue neck lesion.  Upper lungs are clear. CTA HEAD Anterior circulation: Right pipeline stent beginning at the apex  of the siphon and extending into the right M1 segment. Aneurysm of the supra clinoid ICA projecting posteriorly shows flow, with maximal diameter of 8.6 mm. Anterior and middle cerebral arteries in the branch vessels appear otherwise normal. Pipeline stent on the left begins at the junction of the siphon in the supra clinoid ICA an extends into the M1 segment. Aneurysm projecting posteriorly from the site of origin of the anterior cerebral artery shows flow, measuring maximally 6.9 mm in diameter. More distal branch vessels are patent. Posterior circulation: Both vertebral arteries are patent to the basilar with the left being dominant. No basilar stenosis. Posterior circulation branch vessels are normal. Venous sinuses: Patent and normal Anatomic variants: None significant Delayed phase: No abnormal enhancement IMPRESSION: No atherosclerotic disease at the carotid bifurcations of significance. 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 Bilateral pipeline stent placement. Persistent contrast opacification of the right supra clinoid ICA aneurysm with maximal diameter measuring 8.6 mm. Persisting contrast opacification of the left A1 aneurysm with maximal transverse diameter of 6.9 mm. Electronically Signed   By: Nelson Chimes M.D.   On: 03/01/2015 15:04   Dg Chest 2 View  03/12/2015  CLINICAL DATA:  TIA/possible stroke. Three brain aneurysms 3 prior surgeries. EXAM: CHEST  2 VIEW COMPARISON:  11/19/2014 and 11/22/2014 FINDINGS: Lungs are adequately inflated without focal consolidation or effusion. There is mild stable cardiomegaly. There is minimal calcified plaque over the thoracic aorta. There are mild degenerative changes of the spine. IMPRESSION: No active cardiopulmonary  disease. Mild stable cardiomegaly. Electronically Signed   By: Marin Olp M.D.   On: 03/12/2015 21:25   Ct Head Wo Contrast  03/12/2015  CLINICAL DATA:  Change in coordination.  History of stroke EXAM: CT HEAD WITHOUT CONTRAST TECHNIQUE: Contiguous axial images were obtained from the base of the skull through the vertex without intravenous contrast. COMPARISON:  03/01/2015 FINDINGS: Skull and Sinuses:Negative for fracture or destructive process. The visualized mastoids, middle ears, and imaged paranasal sinuses are clear. Visualized orbits: Negative. Brain: No evidence of acute infarction, hemorrhage, hydrocephalus, or mass lesion/mass effect. Left A1 and right supraclinoid ICA aneurysms which have encountered with stents. There has been recent CTA evaluation which shows continued opacification of these aneurysm sacs. There is no evidence of subarachnoid hemorrhage or brain edema. Advanced chronic small vessel disease with confluent ischemic gliosis throughout the deep cerebral white matter and lacunar infarcts in the bilateral corona radiata and right centrum semiovale. Generalized atrophy. IMPRESSION: 1. No acute finding or change from prior. 2. Advanced chronic small vessel disease. 3. Treated intracranial aneurysms, reference CTA 03/01/2015. Electronically Signed   By: Monte Fantasia M.D.   On: 03/12/2015 15:46   Ct Angio Neck W/cm &/or Wo/cm  03/01/2015  CLINICAL DATA:  Three-month follow-up of pipeline stent for treatment of aneurysm. EXAM: CT ANGIOGRAPHY HEAD AND NECK TECHNIQUE: Multidetector CT imaging of the head and neck was performed using the standard protocol during bolus administration of intravenous contrast. Multiplanar CT image reconstructions and MIPs were obtained to evaluate the vascular anatomy. Carotid stenosis measurements (when applicable) are obtained utilizing NASCET criteria, using the distal internal carotid diameter as the denominator. CONTRAST:  50mL OMNIPAQUE IOHEXOL 350 MG/ML  SOLN COMPARISON:  Angiography 11/18/2014 FINDINGS: CT HEAD The brainstem and cerebellum are normal. Pipeline stent devices are present in the anterior circulation at the skullbase bilaterally. Extensive chronic small vessel ischemic changes are again seen throughout the cerebral hemispheric white matter.  No large vessel territory no hydrocephalus or extra-axial fluid. The calvarium is unremarkable. Infarction. CTA NECK Aortic arch: Atherosclerosis of the aorta but no aneurysm or dissection. Branching pattern of the brachiocephalic vessels from the arch is normal with a common origin of the innominate artery and left common carotid artery. Right carotid system: Common carotid artery widely patent to the bifurcation region. Mild atherosclerosis of the carotid bifurcation but no stenosis of the ICA lumen beyond that of the more distal normal cervical ICA and therefore no stenosis. Fusiform aneurysmal dilatation of the ICA proximal to the skullbase with maximal diameter of 8.5 mm. Similar the previous angiogram images. Left carotid system: Common carotid artery widely patent to the bifurcation region. Mild atherosclerotic disease at the carotid bifurcation and proximal ICA bulb but no stenosis. More distal cervical ICA is within normal limits. Vertebral arteries:Both vertebral artery origins are widely patent. Both vertebral arteries widely patent through the cervical region without evidence of focal stenosis or dissection. Skeleton: Mild spondylosis Other neck: No soft tissue neck lesion.  Upper lungs are clear. CTA HEAD Anterior circulation: Right pipeline stent beginning at the apex of the siphon and extending into the right M1 segment. Aneurysm of the supra clinoid ICA projecting posteriorly shows flow, with maximal diameter of 8.6 mm. Anterior and middle cerebral arteries in the branch vessels appear otherwise normal. Pipeline stent on the left begins at the junction of the siphon in the supra clinoid ICA an  extends into the M1 segment. Aneurysm projecting posteriorly from the site of origin of the anterior cerebral artery shows flow, measuring maximally 6.9 mm in diameter. More distal branch vessels are patent. Posterior circulation: Both vertebral arteries are patent to the basilar with the left being dominant. No basilar stenosis. Posterior circulation branch vessels are normal. Venous sinuses: Patent and normal Anatomic variants: None significant Delayed phase: No abnormal enhancement IMPRESSION: No atherosclerotic disease at the carotid bifurcations of significance. 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 Bilateral pipeline stent placement. Persistent contrast opacification of the right supra clinoid ICA aneurysm with maximal diameter measuring 8.6 mm. Persisting contrast opacification of the left A1 aneurysm with maximal transverse diameter of 6.9 mm. Electronically Signed   By: Nelson Chimes M.D.   On: 03/01/2015 15:04   Ir Radiologist Eval & Mgmt  03/10/2015  EXAM: ESTABLISHED PATIENT OFFICE VISIT CHIEF COMPLAINT: Six-month follow-up following treatment of left internal carotid artery terminal aneurysm. 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 The patient is accompanied by her daughter and granddaughter. Clinically the patient reports no significant headaches, nausea, vomiting, visual symptoms, loss of consciousness, seizures or incoordination. 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 Otherwise the patient is at home and able to cope independently. She does complain of increased bruisability especially over her torso. The patient however does report  being depressed but without being weepy. This is corroborated by her daughter and granddaughter. Her appetite is normal. She has gained about 10 pounds since the last procedure. Otherwise she reports no recent chills, fever or rigors. On close questioning, the patient does report spells of bleeding from her nostrils and also her mouth at least once. She denies any hematuria or black  tarry stools. Her review of systems is essentially negative for pathologic symptomatology. Her present medications include albuterol inhaler, aspirin 81 mg, atorvastatin, Plavix, lisinopril hydrochlorothiazide combination, Toprol. Her allergies include Dilaudid, codeine, hydromorphone, latex, sulfa drugs. PHYSICAL EXAMINATION: Affect appropriate. In no acute distress. However does drag her left foot on walking with a cane. This apparently has remained stable since her most recent stroke. ASSESSMENT AND PLAN: The patient is awaiting the results of her CTA scan of her head and neck which she had just a few minutes ago. The results are not available at this time. She did develop extravasation at the site of the needle stick. This has been evaluated and treated with ice pack. Patient will undergo a P2Y12 study today. A follow-up MRI/MRA of the brain with sedation will be scheduled for 6 months time. The results of the CT angiogram done today will hopefully be available for review and reported to the patient. She has been asked to continue with active physical therapy as tolerated. She has scheduled an appointment with outpatient physical therapy. Questions were answered to her satisfaction. They were asked to call should they have any concerns or questions. Electronically Signed   By: [name] M.D.   On: 03/01/2015 15:01        Subjective: Patient denies fevers, chills, headache, chest pain, dyspnea, nausea, vomiting, diarrhea, abdominal pain, dysuria, hematuria   Objective: Filed Vitals:   03/13/15 0240 03/13/15 0420  03/13/15 0648 03/13/15 1336  BP: 118/83 111/51 123/55 114/75  Pulse: 72 59 68 64  Temp: 98.4 F (36.9 C) 98.1 F (36.7 C) 98.5 F (36.9 C) 97 F (36.1 C)  TempSrc: Oral Oral Oral Oral  Resp: 19 16 18 18   Height:   5\' 1"  (1.549 m)   Weight:   72.167 kg (159 lb 1.6 oz)   SpO2: 96% 95% 94% 98%   No intake or output data in the 24 hours ending 03/13/15 1527 Weight change:  Exam:   General:  Pt is alert, follows commands appropriately, not in acute distress  HEENT: No icterus, No thrush, No neck mass, [place]/AT  Cardiovascular: RRR, S1/S2, no rubs, no gallops  Respiratory: CTA bilaterally, no wheezing, no crackles, no rhonchi  Abdomen: Soft/+BS, non tender, non distended, no guarding  Extremities: 1+LE edema, No lymphangitis, No petechiae, No rashes, no synovitis  Data Reviewed: Basic Metabolic Panel:  Recent Labs Lab 03/12/15 1337 03/12/15 1349  NA 138 141  K 3.8 3.8  CL 103 99*  CO2 25  --   GLUCOSE 102* 98  BUN 8 9  CREATININE 0.85 0.80  CALCIUM 9.0  --    Liver Function Tests:  Recent Labs Lab 03/12/15 1337  AST 20  ALT 13*  ALKPHOS 42  BILITOT 0.9  PROT 6.9  ALBUMIN 3.5   No results for input(s): LIPASE, AMYLASE in the last 168 hours. No results for input(s): AMMONIA in the last 168 hours. CBC:  Recent Labs Lab 03/12/15 1337 03/12/15 1349  WBC 4.0  --   NEUTROABS 2.6  --   HGB 12.1 12.2  HCT 35.7* 36.0  MCV 85.8  --   PLT 125*  --    Cardiac Enzymes: No results for input(s): CKTOTAL, CKMB, CKMBINDEX, TROPONINI in the last 168 hours. BNP: Invalid input(s): POCBNP CBG: No results for input(s): GLUCAP in the last 168 hours.  No results found for this or any previous visit (from the past 240 hour(s)).   Scheduled Meds: . aspirin  81 mg  Oral Daily  . atorvastatin  40 mg Oral q1800  . clopidogrel  37.5 mg Oral Daily  . enoxaparin (LOVENOX) injection  40 mg Subcutaneous Q24H  . LORazepam  2 mg Intravenous Once  . metoprolol succinate  25  mg Oral BID   Continuous Infusions:    Osias Resnick, DO  [facility] Pager [phone]  If 7PM-7AM, please contact night-coverage [email or web address] Password [facility] 03/13/2015, 3:27 PM

## 2015-03-13 NOTE — Progress Notes (Signed)
PT Cancellation and Discharge Note  Patient Details Name: Jeanne Lawson MRN: [id number] DOB: 12/08/1945   Cancelled Treatment:    Reason Eval/Treat Not Completed: Other (comment) Physical Therapy Note  Spoke with occupational therapy after initial evaluation. OT and RN report patient is functioning at a high level of independence and no physical therapy is indicated at this time. Stopped by patients room as she was walking back to bed from bathroom. States she feels completely back to baseline with no balance deficits noted. PT is signing-off. Please re-order if there is any significant change in status. Thank you for this referral.  Elayne Snare, [place]     Ellouise Newer 03/13/2015, 3:53 PM

## 2015-03-13 NOTE — Progress Notes (Signed)
Hourly rounding performed. Call light within reach. Pt in no acute distress. Denies needs. Bed alarm on. rn assisted pt with cleaning up bathing supplies. Pt ambulates with cane with rn stand by assist. Pt now sitting on side of bed eating breakfast. Will refill water jug.

## 2015-03-13 NOTE — Progress Notes (Signed)
PT Cancellation Note  Patient Details Name: Jeanne Lawson MRN: [id number] DOB: [birth year]   Cancelled Treatment:    Reason Eval/Treat Not Completed: Patient at procedure or test/unavailable Having EEG performed when PT stopped by. Will check back again soon.  Ellouise Newer 03/13/2015, 12:34 PM Camille Bal Manito, [place]

## 2015-03-13 NOTE — Evaluation (Signed)
Occupational Therapy Evaluation Patient Details Name: Jeanne Lawson MRN: [id number] DOB: 05-25-1945 Today's Date: 03/13/2015    History of Present Illness  This 70 y.o. Female admitted after feeling "fuzzy headed and not right".  CT of head was negative for infarct.  MRI pending.   PMH includes:  Ventricular hypertrophy, pancreatitis x 2, TIA, CAD, COPD, saccular aneurysm, h/o CVA with residual mild Lt sided weakness.    Clinical Impression   Patient evaluated by Occupational Therapy with no further acute OT needs identified. All education has been completed and the patient has no further questions. Pt appears to be at, or close to her baseline level of functioning.  She is able to perform ADLs with set up to mod I level.  She denies falls, but is receiving OPPT currently.  No visual, or cognitive deficits detected.  See below for any follow-up Occupational Therapy or equipment needs. OT is signing off. Thank you for this referral.      Follow Up Recommendations  No OT follow up;Supervision - Intermittent    Equipment Recommendations  None recommended by OT    Recommendations for Other Services       Precautions / Restrictions Precautions Precautions: Fall Restrictions Weight Bearing Restrictions: No      Mobility Bed Mobility Overal bed mobility: Modified Independent                Transfers Overall transfer level: Modified independent Equipment used: Straight cane                  Balance Overall balance assessment: Needs assistance Sitting-balance support: Feet supported Sitting balance-Leahy Scale: Normal     Standing balance support: During functional activity Standing balance-Leahy Scale: Good Standing balance comment: Pt is attending OPPT                             ADL                                         General ADL Comments: Pt is able to perform ADLs with set up assistance.  She reports she bathed self  mod I (after set up)     Vision Vision Assessment?: Yes Eye Alignment: Within Functional Limits Ocular Range of Motion: Within Functional Limits Alignment/Gaze Preference: Within Defined Limits Tracking/Visual Pursuits: Able to track stimulus in all quads without difficulty Saccades: Within functional limits Convergence: Within functional limits Visual Fields: No apparent deficits   Agricultural engineer Tested?: Yes   Praxis Praxis Praxis tested?: Within functional limits    Pertinent Vitals/Pain Pain Assessment: No/denies pain     Hand Dominance Right   Extremity/Trunk Assessment Upper Extremity Assessment Upper Extremity Assessment: LUE deficits/detail LUE Deficits / Details: residual Lt UE weakness from previous CVA, pt reports is at baseline    Lower Extremity Assessment Lower Extremity Assessment: Defer to PT evaluation   Cervical / Trunk Assessment Cervical / Trunk Assessment: Normal   Communication Communication Communication: No difficulties   Cognition Arousal/Alertness: Awake/alert Behavior During Therapy: WFL for tasks assessed/performed Overall Cognitive Status: Within Functional Limits for tasks assessed                     General Comments       Exercises       Shoulder Instructions  Home Living Family/patient expects to be discharged to:: Private residence Living Arrangements: Alone Available Help at Discharge: Family;Friend(s);Available PRN/intermittently Type of Home: House Home Access: Stairs to enter [employer] of Steps: 2 Entrance Stairs-Rails: Right;Left;Can reach both Home Layout: One level     Bathroom Shower/Tub: Tub/shower unit Shower/tub characteristics: Curtain Biochemist, clinical: Standard     Home Equipment: Cane - single point;Tub bench;Walker - 2 wheels   Additional Comments: Pt is goint [facility] OPP[facility]       Prior Functioning/Environment Level of Independence:  Independent with assistive device(s)        Comments: uses single point cane and furniture for ambulation    OT Diagnosis:     OT Problem List:     OT Treatment/Interventions:      OT Goals(Current goals can be found in the care plan section) Acute Rehab OT Goals OT Goal Formulation: All assessment and education complete, DC therapy  OT Frequency:     Barriers to D/C:            Co-evaluation              End of Session Equipment Utilized During Treatment: Other (comment) [facility]) Nurse Communication: Mobility status  Activity Tolerance: Patient tolerated treatment well Patient left: in bed;with call bell/phone within reach;with bed alarm set;with family/visitor present   Time: [id number] OT Time Calculation (min): 42 min Charges:  OT General Charges $OT Visit: 1 Procedure OT Evaluation $OT Eval Moderate Complexity: 1 Procedure OT Treatments $Self Care/Home Management : 8-22 mins $Therapeutic Activity: 8-22 mins G-Codes: OT G-codes **NOT FOR INPATIENT CLASS** Functional Limitation: Self care Self Care Current Status [id number]): At least 1 percent but less than 20 percent impaired, limited or restricted Self Care Goal Status [id number]): At least 1 percent but less than 20 percent impaired, limited or restricted Self Care Discharge Status [phone]): At least 1 percent but less than 20 percent impaired, limited or restricted  Brittan Butterbaugh M 03/13/2015, 10:28 AM

## 2015-03-14 ENCOUNTER — Observation Stay (HOSPITAL_BASED_OUTPATIENT_CLINIC_OR_DEPARTMENT_OTHER): Payer: Medicare Other

## 2015-03-14 DIAGNOSIS — R4182 Altered mental status, unspecified: Secondary | ICD-10-CM | POA: Diagnosis not present

## 2015-03-14 DIAGNOSIS — M79606 Pain in leg, unspecified: Secondary | ICD-10-CM

## 2015-03-14 DIAGNOSIS — D696 Thrombocytopenia, unspecified: Secondary | ICD-10-CM | POA: Diagnosis not present

## 2015-03-14 DIAGNOSIS — I10 Essential (primary) hypertension: Secondary | ICD-10-CM | POA: Diagnosis not present

## 2015-03-14 DIAGNOSIS — E876 Hypokalemia: Secondary | ICD-10-CM

## 2015-03-14 LAB — COMPREHENSIVE METABOLIC PANEL
ALT: 12 U/L — ABNORMAL LOW (ref 14–54)
ANION GAP: 8 (ref 5–15)
AST: 16 U/L (ref 15–41)
Albumin: 3.1 g/dL — ABNORMAL LOW (ref 3.5–5.0)
Alkaline Phosphatase: 35 U/L — ABNORMAL LOW (ref 38–126)
BUN: 12 mg/dL (ref 6–20)
CHLORIDE: 105 mmol/L (ref 101–111)
CO2: 29 mmol/L (ref 22–32)
Calcium: 8.5 mg/dL — ABNORMAL LOW (ref 8.9–10.3)
Creatinine, Ser: 0.79 mg/dL (ref 0.44–1.00)
Glucose, Bld: 94 mg/dL (ref 65–99)
POTASSIUM: 3.3 mmol/L — AB (ref 3.5–5.1)
SODIUM: 142 mmol/L (ref 135–145)
Total Bilirubin: 0.8 mg/dL (ref 0.3–1.2)
Total Protein: 5.9 g/dL — ABNORMAL LOW (ref 6.5–8.1)

## 2015-03-14 LAB — PROTEIN / CREATININE RATIO, URINE
CREATININE, URINE: 227.17 mg/dL
PROTEIN CREATININE RATIO: 0.03 mg/mg{creat} (ref 0.00–0.15)
TOTAL PROTEIN, URINE: 7 mg/dL

## 2015-03-14 MED ORDER — LEVETIRACETAM 250 MG PO TABS
250.0000 mg | ORAL_TABLET | Freq: Two times a day (BID) | ORAL | Status: DC
  Filled 2015-03-14: qty 1

## 2015-03-14 MED ORDER — POTASSIUM CHLORIDE CRYS ER 20 MEQ PO TBCR
20.0000 meq | EXTENDED_RELEASE_TABLET | Freq: Once | ORAL | Status: AC
  Administered 2015-03-14: 20 meq via ORAL
  Filled 2015-03-14: qty 1

## 2015-03-14 NOTE — Progress Notes (Signed)
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 At that time, the patient also felt that she had some left arm weakness which she states she has residual weakness from previous stroke. She denied visual changes, syncope, or other focal deficits. She stated that the episode lasted over an hour but has resolved upon arrival to the emergency department. CT of brain was negative. Neurology was consulted.  Assessment/Plan: Dysphasia -appreciate neurology evaluation; case was discussed with neurology 03/12/15 -Await MRI and MRA of the brain--could not tolerate MRI last night--pt needed more Ativan-->repeat with Ativan 2mg -->still could not tolerate MRI -will try to contact anesthesia 03/15/15 for sedation for MR -??low BP at home with metoprolol and lisinopril/HCTZ-->SBP in hospital 100-110 with only metoprolol -PT/OT/ST  -Hemoglobin A1c--pending  -LDL 72 -Echocardiogram --EF 60-65%, grade 1 DD, trivial TR, normal RV Hypertension  -Continue metoprolol succinate 25 mg bid -Hold lisinopril/HCTZ for now--blood pressure controlled Hyperlipidemia  -Continue Lipitor  History of carotid/intracranial aneurysm status post coil embolization  -Patient has been instructed to continue aspirin and Plavix 37.5 mg daily  Coronary artery disease  -EKG without any concerning ischemic changes  -No angina  -Continue antiplatelet therapy, metoprolol succinate, and Lipitor  History of tobacco abuse/COPD -Patient quit approximately 1 year ago after 50 pack years  -Presently stable on room air  Thrombocytopenia -chronic -monitor for signs of bleeding Leg pain  and edema -duplex r/o DVT--neg -urine protein creatinine ratio Hypokalemia -Replete -Check magnesium   Family Communication: Daughter updated at beside 2/19 Disposition Plan: Home when cleared by neurology      Procedures/Studies: Ct Angio Head W/cm &/or Wo Cm  03/01/2015  CLINICAL DATA:  Three-month follow-up of pipeline stent for treatment of aneurysm. EXAM: CT ANGIOGRAPHY HEAD AND NECK TECHNIQUE: Multidetector CT imaging of the head and neck was performed using the standard protocol during bolus administration of intravenous contrast. Multiplanar CT image reconstructions and MIPs were obtained to evaluate the vascular anatomy. Carotid stenosis measurements (when applicable) are obtained utilizing NASCET criteria, using the distal internal carotid diameter as the denominator. CONTRAST:  28mL OMNIPAQUE IOHEXOL 350 MG/ML SOLN COMPARISON:  Angiography 11/18/2014 FINDINGS: CT HEAD The brainstem and cerebellum are normal. Pipeline stent devices are present in the anterior circulation at the skullbase bilaterally. Extensive chronic small vessel ischemic changes are again seen throughout the cerebral hemispheric white matter. No large vessel territory no hydrocephalus or extra-axial fluid. The calvarium is unremarkable. Infarction. CTA NECK Aortic arch: Atherosclerosis of the aorta but no aneurysm or dissection. Branching pattern of the brachiocephalic vessels from the arch is normal with a common origin of the innominate artery and left common carotid artery. Right carotid system: Common carotid artery widely patent to the bifurcation region. Mild atherosclerosis of the carotid bifurcation but no stenosis of the ICA lumen beyond that of the more distal normal cervical ICA and therefore no stenosis. Fusiform aneurysmal dilatation of the ICA proximal to the skullbase with maximal diameter of 8.5 mm. Similar the previous angiogram images. Left carotid system: Common carotid artery widely patent to the  bifurcation region. Mild atherosclerotic disease at the carotid bifurcation and proximal ICA bulb but no stenosis. More distal cervical ICA is within normal limits. Vertebral arteries:Both  vertebral artery origins are widely patent. Both vertebral arteries widely patent through the cervical region without evidence of focal stenosis or dissection. Skeleton: Mild spondylosis Other neck: No soft tissue neck lesion.  Upper lungs are clear. CTA HEAD Anterior circulation: Right pipeline stent beginning at the apex of the siphon and extending into the right M1 segment. Aneurysm of the supra clinoid ICA projecting posteriorly shows flow, with maximal diameter of 8.6 mm. Anterior and middle cerebral arteries in the branch vessels appear otherwise normal. Pipeline stent on the left begins at the junction of the siphon in the supra clinoid ICA an extends into the M1 segment. Aneurysm projecting posteriorly from the site of origin of the anterior cerebral artery shows flow, measuring maximally 6.9 mm in diameter. More distal branch vessels are patent. Posterior circulation: Both vertebral arteries are patent to the basilar with the left being dominant. No basilar stenosis. Posterior circulation branch vessels are normal. Venous sinuses: Patent and normal Anatomic variants: None significant Delayed phase: No abnormal enhancement IMPRESSION: No atherosclerotic disease at the carotid bifurcations of significance. 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 Bilateral pipeline stent placement. Persistent contrast opacification of the right supra clinoid ICA aneurysm with maximal diameter measuring 8.6 mm. Persisting contrast opacification of the left A1 aneurysm with maximal transverse diameter of 6.9 mm. Electronically Signed   By: Nelson Chimes M.D.   On: 03/01/2015 15:04   Dg Chest 2 View  03/12/2015  CLINICAL DATA:  TIA/possible stroke. Three brain aneurysms  3 prior surgeries. EXAM: CHEST  2 VIEW COMPARISON:  11/19/2014 and 11/22/2014 FINDINGS: Lungs are adequately inflated without focal consolidation or effusion. There is mild stable cardiomegaly. There is minimal calcified plaque over the thoracic aorta. There are mild degenerative changes of the spine. IMPRESSION: No active cardiopulmonary disease. Mild stable cardiomegaly. Electronically Signed   By: Marin Olp M.D.   On: 03/12/2015 21:25   Ct Head Wo Contrast  03/12/2015  CLINICAL DATA:  Change in coordination.  History of stroke EXAM: CT HEAD WITHOUT CONTRAST TECHNIQUE: Contiguous axial images were obtained from the base of the skull through the vertex without intravenous contrast. COMPARISON:  03/01/2015 FINDINGS: Skull and Sinuses:Negative for fracture or destructive process. The visualized mastoids, middle ears, and imaged paranasal sinuses are clear. Visualized orbits: Negative. Brain: No evidence of acute infarction, hemorrhage, hydrocephalus, or mass lesion/mass effect. Left A1 and right supraclinoid ICA aneurysms which have encountered with stents. There has been recent CTA evaluation which shows continued opacification of these aneurysm sacs. There is no evidence of subarachnoid hemorrhage or brain edema. Advanced chronic small vessel disease with confluent ischemic gliosis throughout the deep cerebral white matter and lacunar infarcts in the bilateral corona radiata and right centrum semiovale. Generalized atrophy. IMPRESSION: 1. No acute finding or change from prior. 2. Advanced chronic small vessel disease. 3. Treated intracranial aneurysms, reference CTA 03/01/2015. Electronically Signed   By: Monte Fantasia M.D.   On: 03/12/2015 15:46   Ct Angio Neck W/cm &/or Wo/cm  03/01/2015  CLINICAL DATA:  Three-month follow-up of pipeline stent for treatment of aneurysm. EXAM: CT ANGIOGRAPHY HEAD AND NECK TECHNIQUE: Multidetector CT imaging of the head and neck was performed using the standard protocol  during bolus administration of intravenous contrast. Multiplanar CT image reconstructions and MIPs were obtained to evaluate the vascular anatomy. Carotid stenosis measurements (when applicable) are obtained utilizing NASCET criteria, using the distal internal carotid diameter as the denominator. CONTRAST:  53mL OMNIPAQUE IOHEXOL 350 MG/ML SOLN COMPARISON:  Angiography 11/18/2014 FINDINGS: CT HEAD The brainstem and cerebellum are normal. Pipeline stent devices are present in the anterior circulation at the skullbase bilaterally. Extensive chronic small vessel ischemic changes are again seen throughout the cerebral hemispheric white matter. No large vessel territory no hydrocephalus or extra-axial fluid. The calvarium is unremarkable. Infarction. CTA NECK Aortic arch: Atherosclerosis of the aorta but no aneurysm or dissection. Branching pattern of the brachiocephalic vessels from the arch is normal with a common origin of the innominate artery and left common carotid artery. Right carotid system: Common carotid artery widely patent to the bifurcation region. Mild atherosclerosis of the carotid bifurcation but no stenosis of the ICA lumen beyond that of the more distal normal cervical ICA and therefore no stenosis. Fusiform aneurysmal dilatation of the ICA proximal to the skullbase with maximal diameter of 8.5 mm. Similar the previous angiogram images. Left carotid system: Common carotid artery widely patent to the bifurcation region. Mild atherosclerotic disease at the carotid bifurcation and proximal ICA bulb but no stenosis. More distal cervical ICA is within normal limits. Vertebral arteries:Both vertebral artery origins are widely patent. Both vertebral arteries widely patent through the cervical region without evidence of focal stenosis or dissection. Skeleton: Mild spondylosis Other neck: No soft tissue neck lesion.  Upper lungs are clear. CTA HEAD Anterior circulation: Right pipeline stent beginning at the  apex of the siphon and extending into the right M1 segment. Aneurysm of the supra clinoid ICA projecting posteriorly shows flow, with maximal diameter of 8.6 mm. Anterior and middle cerebral arteries in the branch vessels appear otherwise normal. Pipeline stent on the left begins at the junction of the siphon in the supra clinoid ICA an extends into the M1 segment. Aneurysm projecting posteriorly from the site of origin of the anterior cerebral artery shows flow, measuring maximally 6.9 mm in diameter. More distal branch vessels are patent. Posterior circulation: Both vertebral arteries are patent to the basilar with the left being dominant. No basilar stenosis. Posterior circulation branch vessels are normal. Venous sinuses: Patent and normal Anatomic variants: None significant Delayed phase: No abnormal enhancement IMPRESSION: No atherosclerotic disease at the carotid bifurcations of significance. 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 Bilateral pipeline stent placement. Persistent contrast opacification of the right supra clinoid ICA aneurysm with maximal diameter measuring 8.6 mm. Persisting contrast opacification of the left A1 aneurysm with maximal transverse diameter of 6.9 mm. Electronically Signed   By: Nelson Chimes M.D.   On: 03/01/2015 15:04   Ir Radiologist Eval & Mgmt  03/10/2015  EXAM: ESTABLISHED PATIENT OFFICE VISIT CHIEF COMPLAINT: Six-month follow-up following treatment of left internal carotid artery terminal aneurysm. 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 The patient is accompanied by her daughter and granddaughter. Clinically the patient reports no significant headaches, nausea, vomiting,  visual symptoms, loss of consciousness, seizures or incoordination. 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 Otherwise the patient is at home and able to cope independently. She does complain of increased bruisability especially over her torso. The patient however does report being depressed but without being weepy. This is corroborated by her daughter and granddaughter. Her appetite  is normal. She has gained about 10 pounds since the last procedure. Otherwise she reports no recent chills, fever or rigors. On close questioning, the patient does report spells of bleeding from her nostrils and also her mouth at least once. She denies any hematuria or black tarry stools. Her review of systems is essentially negative for pathologic symptomatology. Her present medications include albuterol inhaler, aspirin 81 mg, atorvastatin, Plavix, lisinopril hydrochlorothiazide combination, Toprol. Her allergies include Dilaudid, codeine, hydromorphone, latex, sulfa drugs. PHYSICAL EXAMINATION: Affect appropriate. In no acute distress. However does drag her left foot on walking with a cane. This apparently has remained stable since her most recent stroke. ASSESSMENT AND PLAN: The patient is awaiting the results of her CTA scan of her head and neck which she had just a few minutes ago. The results are not available at this time. She did develop extravasation at the site of the needle stick. This has been evaluated and treated with ice pack. Patient will undergo a P2Y12 study today. A follow-up MRI/MRA of the brain with sedation will be scheduled for 6 months time. The results of the CT angiogram done today will hopefully be available for review and reported to the patient. She has been asked to continue with active physical therapy as tolerated. She has scheduled an appointment with outpatient physical therapy. Questions were answered to her satisfaction. They were asked to  call should they have any concerns or questions. Electronically Signed   By: [name] M.D.   On: 03/01/2015 15:01         Subjective: Patient denies fevers, chills, headache, chest pain, dyspnea, nausea, vomiting, diarrhea, abdominal pain, dysuria, hematuria   Objective: Filed Vitals:   03/13/15 2203 03/14/15 0206 03/14/15 0651 03/14/15 1042  BP: 112/51 105/48 124/78 119/69  Pulse: 61 62 68 65  Temp: 97.8 F (36.6 C) 98.3 F (36.8 C) 97.8 F (36.6 C) 98.2 F (36.8 C)  TempSrc: Axillary Oral Oral Oral  Resp: 16 16 16 16   Height:      Weight:      SpO2: 92% 94% 94% 95%    Intake/Output Summary (Last 24 hours) at 03/14/15 1824 Last data filed at 03/14/15 1058  Gross per 24 hour  Intake    240 ml  Output      0 ml  Net    240 ml   Weight change:  Exam:   General:  Pt is alert, follows commands appropriately, not in acute distress  HEENT: No icterus, No thrush, No neck mass, [place]/AT  Cardiovascular: RRR, S1/S2, no rubs, no gallops  Respiratory: CTA bilaterally, no wheezing, no crackles, no rhonchi  Abdomen: Soft/+BS, non tender, non distended, no guarding  Extremities: No edema, No lymphangitis, No petechiae, No rashes, no synovitis  Data Reviewed: Basic Metabolic Panel:  Recent Labs Lab 03/12/15 1337 03/12/15 1349 03/14/15 0413  NA 138 141 142  K 3.8 3.8 3.3*  CL 103 99* 105  CO2 25  --  29  GLUCOSE 102* 98 94  BUN 8 9 12   CREATININE 0.85 0.80 0.79  CALCIUM 9.0  --  8.5*   Liver Function Tests:  Recent Labs Lab 03/12/15 1337 03/14/15 0413  AST 20 16  ALT 13* 12*  ALKPHOS 42 35*  BILITOT 0.9 0.8  PROT 6.9 5.9*  ALBUMIN 3.5 3.1*   No results for input(s): LIPASE, AMYLASE in the last 168 hours. No results for input(s): AMMONIA in the last 168 hours. CBC:  Recent Labs Lab 03/12/15  1337 03/12/15 1349  WBC 4.0  --   NEUTROABS 2.6  --   HGB 12.1 12.2  HCT 35.7* 36.0  MCV 85.8  --   PLT 125*  --    Cardiac Enzymes: No  results for input(s): CKTOTAL, CKMB, CKMBINDEX, TROPONINI in the last 168 hours. BNP: Invalid input(s): POCBNP CBG: No results for input(s): GLUCAP in the last 168 hours.  No results found for this or any previous visit (from the past 240 hour(s)).   Scheduled Meds: . aspirin  81 mg Oral Daily  . atorvastatin  40 mg Oral q1800  . clopidogrel  37.5 mg Oral Daily  . enoxaparin (LOVENOX) injection  40 mg Subcutaneous Q24H  . levETIRAcetam  250 mg Oral BID  . metoprolol succinate  25 mg Oral BID   Continuous Infusions:    Brinna Divelbiss, DO  [facility] Pager [phone]  If 7PM-7AM, please contact night-coverage [email or web address] Password [facility] 03/14/2015, 6:24 PM

## 2015-03-14 NOTE — Progress Notes (Signed)
Interval History:                                                                                                                      Jeanne Lawson is an 70 y.o. female patient who presented with h/o transient episode of AMS. No further such episodes since admission. MRI brain, MRA head still pending, were attempted but couldn't be done as pt is severely claustrophobic.  EEG was abnormal, with few intermittent abnormal discharges in the left temporal region, could suggest an underlying area of brain irritability in the left temporal area.   Past Medical History: Past Medical History  Diagnosis Date  . Ventricular hypertrophy 04/2009    LVH with diastolic dysfunction by echo. Has normal EF.  Marland Kitchen Pancreatitis     x2  . PAC (premature atrial contraction)   . TIA (transient ischemic attack)     She was hospitalized 04-23-09 through 04-27-09 for involving right side of the body  . Hypertension   . Tobacco abuse     Ongoing   . Edema of foot     She has a history of chronic edema of the left dated back to age 30 when she suufered severe frostbite playing  on the snow as a child  . Coronary artery disease 1996    Known with prior mild lesion of LAD demonstrated by Cardiac Catheterization in 1996  . COPD (chronic obstructive pulmonary disease) ([place])   . Shortness of breath dyspnea     since stroke 2 months ago -  . 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  Marland Kitchen PONV (postoperative nausea and vomiting)     problems waking up last time 4/16 only time  . 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 Neck surgery  50 yrs ago    Left side tumor  . US echocardiography  04-26-2009    EF 65-70%  . Cardiovascular stress test   12-02-2001    EF 70%  . Cardiac catheterization  1996    Mild CAD with vasospasm  . Abdominal hysterectomy    . Laparoscopy    . Radiology with anesthesia N/A 05/25/2014    Procedure: RADIOLOGY WITH ANESTHESIA;  Surgeon: [name], MD;  Location: [place];  Service: Radiology;  Laterality: N/A;  . Appendectomy    . Radiology with anesthesia N/A 08/12/2014    Procedure: RADIOLOGY WITH ANESTHESIA;  Surgeon: [name], MD;  Location: [place];  Service: Radiology;  Laterality: N/A;  . Endarterectomy Right 08/12/2014    Procedure: RIGHT  COMMON CAROTID ARTERY EXPOSURE FOR INTERVENTIONAL RADIOLOGY PROCEDURE BY DR.DEVASHWAR,Insertion 6 FR sheath;  Surgeon: Jessy Oto  Fields, MD;  Location: [place];  Service: Vascular;  Laterality: Right;  . Endarterectomy Right 08/12/2014    Procedure:  CAROTID  EXPOSURE CLOSURE RIGHT NECK, REPAIR RIGHT COMMON CAROTID ARTERY;  Surgeon: Elam Dutch, MD;  Location: [place];  Service: Vascular;  Laterality: Right;  . Endarterectomy Left 11/18/2014    Procedure: CAROTID EXPOSURE;  Surgeon: Elam Dutch, MD;  Location: [place];  Service: Vascular;  Laterality: Left;  . Endarterectomy Left 11/18/2014    Procedure: CLOSURE CAROTID;  Surgeon: Elam Dutch, MD;  Location: [facility];  Service: Vascular;  Laterality: Left;    Family History: Family History  Problem Relation Age of Onset  . Emphysema Father   . Aneurysm Sister   . Stroke Mother   . Hypertension Mother   . Hypertension Daughter   . Thyroid disease Daughter     Social History:   reports that she quit smoking about 10 months ago. Her smoking use included Cigarettes. She has a 30 pack-year smoking history. She has never used smokeless tobacco. She reports that she does not drink alcohol or use illicit drugs.  Allergies:  Allergies  Allergen Reactions  . Dilaudid [Hydromorphone] Swelling  . Codeine Nausea And Vomiting  . Hydromorphone Hcl Swelling  . Latex Swelling    gloves used at dental  office caused lips to swell. Used different ones no problem. Never had any problem at hospital or anywhere else.  . Sulfa Drugs Cross Reactors Other (See Comments)    Unknown childhood reaction     Medications:                                                                                                                         Current facility-administered medications:  .  albuterol (PROVENTIL) (2.5 MG/3ML) 0.083% nebulizer solution 3 mL, 3 mL, Inhalation, Q6H PRN, Orson Eva, MD .  aspirin chewable tablet 81 mg, 81 mg, Oral, Daily, Orson Eva, MD, 81 mg at 03/14/15 0924 .  atorvastatin (LIPITOR) tablet 40 mg, 40 mg, Oral, q1800, Orson Eva, MD, 40 mg at 03/14/15 1719 .  clopidogrel (PLAVIX) tablet 37.5 mg, 37.5 mg, Oral, Daily, Orson Eva, MD, 37.5 mg at 03/14/15 0925 .  enoxaparin (LOVENOX) injection 40 mg, 40 mg, Subcutaneous, Q24H, Orson Eva, MD, 40 mg at 03/13/15 2202 .  levETIRAcetam (KEPPRA) tablet 250 mg, 250 mg, Oral, BID, Ixchel Duck Fuller Mandril, MD .  metoprolol succinate (TOPROL-XL) 24 hr tablet 25 mg, 25 mg, Oral, BID, Orson Eva, MD, 25 mg at 03/14/15 0925 .  senna-docusate (Senokot-S) tablet 1 tablet, 1 tablet, Oral, QHS PRN, Orson Eva, MD   Neurologic Examination:  Blood pressure 127/54, pulse 69, temperature 97.8 F (36.6 C), temperature source Oral, resp. rate 16, height 5\' 1"  (1.549 m), weight 72.167 kg (159 lb 1.6 oz), SpO2 98 %.  Evaluation of higher integrative functions including: Level of alertness: Alert,  Oriented to time, place and person Attention span and concentration  - intact   Speech: fluent, no evidence of dysarthria or aphasia noted.  Test the following cranial nerves: 2-12 grossly intact Motor examination: Normal tone, bulk, full 5/5 motor strength in all 4 extremities Examination of sensation : Normal and symmetric sensation to pinprick in all 4  extremities and on face Examination of deep tendon reflexes: 2+, normal and symmetric in all extremities, normal plantars bilaterally Test coordination: Normal finger nose testing, with no evidence of limb appendicular ataxia or abnormal involuntary movements or tremors noted.  Gait: Deferred   Lab Results: Basic Metabolic Panel:  Recent Labs Lab 03/12/15 1337 03/12/15 1349 03/14/15 0413  NA 138 141 142  K 3.8 3.8 3.3*  CL 103 99* 105  CO2 25  --  29  GLUCOSE 102* 98 94  BUN 8 9 12   CREATININE 0.85 0.80 0.79  CALCIUM 9.0  --  8.5*    Liver Function Tests:  Recent Labs Lab 03/12/15 1337 03/14/15 0413  AST 20 16  ALT 13* 12*  ALKPHOS 42 35*  BILITOT 0.9 0.8  PROT 6.9 5.9*  ALBUMIN 3.5 3.1*   No results for input(s): LIPASE, AMYLASE in the last 168 hours. No results for input(s): AMMONIA in the last 168 hours.  CBC:  Recent Labs Lab 03/12/15 1337 03/12/15 1349  WBC 4.0  --   NEUTROABS 2.6  --   HGB 12.1 12.2  HCT 35.7* 36.0  MCV 85.8  --   PLT 125*  --     Cardiac Enzymes: No results for input(s): CKTOTAL, CKMB, CKMBINDEX, TROPONINI in the last 168 hours.  Lipid Panel:  Recent Labs Lab 03/13/15 0530  CHOL 158  TRIG 145  HDL 66  CHOLHDL 2.4  VLDL 29  LDLCALC 63    CBG: No results for input(s): GLUCAP in the last 168 hours.  Microbiology: Results for orders placed or performed during the hospital encounter of 11/13/14  Surgical pcr screen     Status: None   Collection Time: 11/13/14 10:30 AM  Result Value Ref Range Status   MRSA, PCR NEGATIVE NEGATIVE Final   Staphylococcus aureus NEGATIVE NEGATIVE Final    Comment:        The Xpert SA Assay (FDA approved for NASAL specimens in patients over 39 years of age), is one component of a comprehensive surveillance program.  Test performance has been validated by [facility] for patients greater than or equal to 3 year old. It is not intended to diagnose infection nor to guide or monitor  treatment.     Imaging: Dg Chest 2 View  03/12/2015  CLINICAL DATA:  TIA/possible stroke. Three brain aneurysms 3 prior surgeries. EXAM: CHEST  2 VIEW COMPARISON:  11/19/2014 and 11/22/2014 FINDINGS: Lungs are adequately inflated without focal consolidation or effusion. There is mild stable cardiomegaly. There is minimal calcified plaque over the thoracic aorta. There are mild degenerative changes of the spine. IMPRESSION: No active cardiopulmonary disease. Mild stable cardiomegaly. Electronically Signed   By: Marin Olp M.D.   On: 03/12/2015 21:25   Ct Head Wo Contrast  03/12/2015  CLINICAL DATA:  Change in coordination.  History of stroke EXAM: CT HEAD WITHOUT CONTRAST TECHNIQUE: Contiguous  axial images were obtained from the base of the skull through the vertex without intravenous contrast. COMPARISON:  03/01/2015 FINDINGS: Skull and Sinuses:Negative for fracture or destructive process. The visualized mastoids, middle ears, and imaged paranasal sinuses are clear. Visualized orbits: Negative. Brain: No evidence of acute infarction, hemorrhage, hydrocephalus, or mass lesion/mass effect. Left A1 and right supraclinoid ICA aneurysms which have encountered with stents. There has been recent CTA evaluation which shows continued opacification of these aneurysm sacs. There is no evidence of subarachnoid hemorrhage or brain edema. Advanced chronic small vessel disease with confluent ischemic gliosis throughout the deep cerebral white matter and lacunar infarcts in the bilateral corona radiata and right centrum semiovale. Generalized atrophy. IMPRESSION: 1. No acute finding or change from prior. 2. Advanced chronic small vessel disease. 3. Treated intracranial aneurysms, reference CTA 03/01/2015. Electronically Signed   By: Monte Fantasia M.D.   On: 03/12/2015 15:46    Assessment and plan:   Jeanne Lawson is an 70 y.o. female patient who presented  with h/o transient episode of AMS. No further such  episodes since admission. MRI brain, MRA head still pending, were attempted but couldn't be done as pt is severely claustrophobic. EEG was abnormal, with few intermittent abnormal discharges in the left temporal region, could suggest an underlying area of brain irritability in the left temporal area. Discussed the EEG findings with patient, Recommend starting Keppra for seizure prophylaxis. She is worried about potential side effects. Hence, will start a small dose of 250 mg BID now. She is advised to f/u with out pt neurology for further dose titrations.  Planned MRI's studies under sedation tomorrow.  Will f/u.

## 2015-03-14 NOTE — Progress Notes (Signed)
*  Preliminary Results* Bilateral lower extremity venous duplex completed. Bilateral lower extremities are negative for deep vein thrombosis. There is no evidence of Baker's cyst bilaterally.  03/14/2015  Maudry Mayhew, RVT, RDCS, RDMS

## 2015-03-14 NOTE — Procedures (Signed)
   Current facility-administered medications:  .  albuterol (PROVENTIL) (2.5 MG/3ML) 0.083% nebulizer solution 3 mL, 3 mL, Inhalation, Q6H PRN, Orson Eva, MD .  aspirin chewable tablet 81 mg, 81 mg, Oral, Daily, Orson Eva, MD, 81 mg at 03/14/15 0924 .  atorvastatin (LIPITOR) tablet 40 mg, 40 mg, Oral, q1800, Orson Eva, MD, 40 mg at 03/14/15 1719 .  clopidogrel (PLAVIX) tablet 37.5 mg, 37.5 mg, Oral, Daily, Orson Eva, MD, 37.5 mg at 03/14/15 0925 .  enoxaparin (LOVENOX) injection 40 mg, 40 mg, Subcutaneous, Q24H, Orson Eva, MD, 40 mg at 03/13/15 2202 .  levETIRAcetam (KEPPRA) tablet 250 mg, 250 mg, Oral, BID, Brian Zeitlin Fuller Mandril, MD .  metoprolol succinate (TOPROL-XL) 24 hr tablet 25 mg, 25 mg, Oral, BID, Orson Eva, MD, 25 mg at 03/14/15 0925 .  senna-docusate (Senokot-S) tablet 1 tablet, 1 tablet, Oral, QHS PRN, Orson Eva, MD  Introduction:  This is a 19 channel routine scalp EEG performed at the bedside with bipolar and monopolar montages arranged in accordance to the international 10/20 system of electrode placement. One channel was dedicated to EKG recording.    Findings:  The background rhythm was normal 9-10 Hz alpha . A few intermittent left temporal sharps were seen with phase reversals. No definite evidence of electrographic seizures were noted during this recording.   Impression:  This is an abnormal routine inpatient EEG, with a few intermittent abnormal discharges in the left temporal region, could suggest an underlying area of brain irritability in the left temporal area. Clinical correlation is recommended .

## 2015-03-15 ENCOUNTER — Ambulatory Visit: Payer: Medicare Other | Admitting: Physical Therapy

## 2015-03-15 ENCOUNTER — Encounter (HOSPITAL_COMMUNITY): Payer: Self-pay | Admitting: Certified Registered Nurse Anesthetist

## 2015-03-15 ENCOUNTER — Encounter (HOSPITAL_COMMUNITY)
Admission: EM | Disposition: A | Discharge status: Home / Self Care | Payer: Self-pay | Source: Home / Self Care | Attending: Internal Medicine

## 2015-03-15 ENCOUNTER — Observation Stay (HOSPITAL_COMMUNITY): Payer: Medicare Other | Admitting: Certified Registered Nurse Anesthetist

## 2015-03-15 ENCOUNTER — Observation Stay (HOSPITAL_COMMUNITY): Payer: Medicare Other

## 2015-03-15 DIAGNOSIS — I2510 Atherosclerotic heart disease of native coronary artery without angina pectoris: Secondary | ICD-10-CM | POA: Diagnosis present

## 2015-03-15 DIAGNOSIS — E669 Obesity, unspecified: Secondary | ICD-10-CM | POA: Diagnosis present

## 2015-03-15 DIAGNOSIS — I639 Cerebral infarction, unspecified: Secondary | ICD-10-CM | POA: Diagnosis present

## 2015-03-15 DIAGNOSIS — I10 Essential (primary) hypertension: Secondary | ICD-10-CM | POA: Diagnosis not present

## 2015-03-15 DIAGNOSIS — Z7982 Long term (current) use of aspirin: Secondary | ICD-10-CM | POA: Diagnosis not present

## 2015-03-15 DIAGNOSIS — Z87891 Personal history of nicotine dependence: Secondary | ICD-10-CM | POA: Diagnosis not present

## 2015-03-15 DIAGNOSIS — J449 Chronic obstructive pulmonary disease, unspecified: Secondary | ICD-10-CM | POA: Diagnosis present

## 2015-03-15 DIAGNOSIS — E785 Hyperlipidemia, unspecified: Secondary | ICD-10-CM | POA: Diagnosis present

## 2015-03-15 DIAGNOSIS — E876 Hypokalemia: Secondary | ICD-10-CM | POA: Diagnosis not present

## 2015-03-15 DIAGNOSIS — Z7902 Long term (current) use of antithrombotics/antiplatelets: Secondary | ICD-10-CM | POA: Diagnosis not present

## 2015-03-15 DIAGNOSIS — Z6830 Body mass index (BMI) 30.0-30.9, adult: Secondary | ICD-10-CM | POA: Diagnosis not present

## 2015-03-15 DIAGNOSIS — I119 Hypertensive heart disease without heart failure: Secondary | ICD-10-CM | POA: Diagnosis present

## 2015-03-15 DIAGNOSIS — Z8673 Personal history of transient ischemic attack (TIA), and cerebral infarction without residual deficits: Secondary | ICD-10-CM | POA: Diagnosis not present

## 2015-03-15 DIAGNOSIS — R4702 Dysphasia: Secondary | ICD-10-CM | POA: Diagnosis present

## 2015-03-15 DIAGNOSIS — Z79899 Other long term (current) drug therapy: Secondary | ICD-10-CM | POA: Diagnosis not present

## 2015-03-15 DIAGNOSIS — I63532 Cerebral infarction due to unspecified occlusion or stenosis of left posterior cerebral artery: Secondary | ICD-10-CM | POA: Diagnosis present

## 2015-03-15 DIAGNOSIS — R600 Localized edema: Secondary | ICD-10-CM | POA: Diagnosis present

## 2015-03-15 DIAGNOSIS — D696 Thrombocytopenia, unspecified: Secondary | ICD-10-CM | POA: Diagnosis present

## 2015-03-15 DIAGNOSIS — I69354 Hemiplegia and hemiparesis following cerebral infarction affecting left non-dominant side: Secondary | ICD-10-CM | POA: Diagnosis not present

## 2015-03-15 HISTORY — PX: RADIOLOGY WITH ANESTHESIA: SHX6223

## 2015-03-15 LAB — CBC
HEMATOCRIT: 32.3 % — AB (ref 36.0–46.0)
Hemoglobin: 11.1 g/dL — ABNORMAL LOW (ref 12.0–15.0)
MCH: 29.2 pg (ref 26.0–34.0)
MCHC: 34.4 g/dL (ref 30.0–36.0)
MCV: 85 fL (ref 78.0–100.0)
PLATELETS: 107 10*3/uL — AB (ref 150–400)
RBC: 3.8 MIL/uL — ABNORMAL LOW (ref 3.87–5.11)
RDW: 14.2 % (ref 11.5–15.5)
WBC: 3.3 10*3/uL — AB (ref 4.0–10.5)

## 2015-03-15 LAB — BASIC METABOLIC PANEL
Anion gap: 8 (ref 5–15)
BUN: 8 mg/dL (ref 6–20)
CO2: 26 mmol/L (ref 22–32)
CREATININE: 0.77 mg/dL (ref 0.44–1.00)
Calcium: 8.4 mg/dL — ABNORMAL LOW (ref 8.9–10.3)
Chloride: 107 mmol/L (ref 101–111)
GFR calc Af Amer: >60 mL/min (ref >60)
GLUCOSE: 99 mg/dL (ref 65–99)
Potassium: 3.7 mmol/L (ref 3.5–5.1)
SODIUM: 141 mmol/L (ref 135–145)

## 2015-03-15 LAB — CREATININE, SERUM
Creatinine, Ser: 0.82 mg/dL (ref 0.44–1.00)
GFR calc Af Amer: >60 mL/min (ref >60)
GFR calc non Af Amer: >60 mL/min (ref >60)

## 2015-03-15 LAB — MAGNESIUM: Magnesium: 2 mg/dL (ref 1.7–2.4)

## 2015-03-15 LAB — HEMOGLOBIN A1C
HEMOGLOBIN A1C: 5.5 % (ref 4.8–5.6)
MEAN PLASMA GLUCOSE: 111 mg/dL

## 2015-03-15 SURGERY — RADIOLOGY WITH ANESTHESIA
Anesthesia: General

## 2015-03-15 MED ORDER — ONDANSETRON HCL 4 MG/2ML IJ SOLN: 4.0000 mg | Freq: Once | INTRAMUSCULAR | Status: DC | PRN

## 2015-03-15 MED ORDER — FENTANYL CITRATE (PF) 100 MCG/2ML IJ SOLN: 25.0000 ug | INTRAMUSCULAR | Status: DC | PRN

## 2015-03-15 MED ORDER — ACETAMINOPHEN 325 MG PO TABS
650.0000 mg | ORAL_TABLET | Freq: Four times a day (QID) | ORAL | Status: DC | PRN
  Administered 2015-03-15: 650 mg via ORAL
  Filled 2015-03-15: qty 2

## 2015-03-15 MED ORDER — TRAMADOL HCL 50 MG PO TABS: 50.0000 mg | ORAL_TABLET | Freq: Four times a day (QID) | ORAL | Status: DC | PRN

## 2015-03-15 MED ORDER — LACTATED RINGERS IV SOLN
Freq: Once | INTRAVENOUS | Status: AC
  Administered 2015-03-15: 13:00:00 via INTRAVENOUS

## 2015-03-15 NOTE — Progress Notes (Signed)
SLP Cancellation Note  Patient Details Name: Jeanne Lawson MRN: [id number] DOB: 01/23/1946   Cancelled treatment: Pt in MRI.  F/u next date                                                                                                       Assunta Curtis 03/15/2015, 2:12 PM

## 2015-03-15 NOTE — Transfer of Care (Signed)
Immediate Anesthesia Transfer of Care Note  Patient: Jeanne Lawson  Procedure(s) Performed: Procedure(s) with comments: MRI OF BRAIN WITHOUT CONTRAST (N/A) - DR. TAT/MRI  Patient Location: PACU  Anesthesia Type:General  Level of Consciousness: awake, alert , oriented and patient cooperative  Airway & Oxygen Therapy: Patient Spontanous Breathing and Patient connected to face mask oxygen  Post-op Assessment: Report given to RN and Post -op Vital signs reviewed and stable  Post vital signs: Reviewed and stable  Last Vitals:  Filed Vitals:   03/15/15 0540 03/15/15 1058  BP: 127/60 150/80  Pulse: 60 58  Temp: 36.8 C 36.9 C  Resp: 16 16    Complications: No apparent anesthesia complications

## 2015-03-15 NOTE — Anesthesia Preprocedure Evaluation (Addendum)
Anesthesia Evaluation  Patient identified by MRN, date of birth, ID band Patient awake    Reviewed: Allergy & Precautions, NPO status , Patient's Chart, lab work & pertinent test results  History of Anesthesia Complications (+) PONV and history of anesthetic complications ("prolblems wakin gup last time 4/16 - only time")  Airway Mallampati: II  TM Distance: >3 FB     Dental  (+) Partial Lower, Partial Upper   Pulmonary shortness of breath, COPD, former smoker,    breath sounds clear to auscultation       Cardiovascular hypertension, + CAD and + Peripheral Vascular Disease   Rhythm:Regular Rate:Normal     Neuro/Psych TIACVA (L arm and L leg weakness), Residual Symptoms    GI/Hepatic   Endo/Other    Renal/GU      Musculoskeletal   Abdominal   Peds  Hematology   Anesthesia Other Findings   Reproductive/Obstetrics                           Anesthesia Physical Anesthesia Plan  ASA: III  Anesthesia Plan: General   Post-op Pain Management:    Induction: Intravenous  Airway Management Planned: Oral ETT  Additional Equipment:   Intra-op Plan:   Post-operative Plan: Extubation in OR  Informed Consent: I have reviewed the patients History and Physical, chart, labs and discussed the procedure including the risks, benefits and alternatives for the proposed anesthesia with the patient or authorized representative who has indicated his/her understanding and acceptance.   Dental advisory given  Plan Discussed with: CRNA and Anesthesiologist  Anesthesia Plan Comments: (TIA H/O previous CVA Intracerebral aneurysms Htn Smoker LVH on ECHO  Plan GA with oral ETT  Roberts Gaudy)       Anesthesia Quick Evaluation

## 2015-03-15 NOTE — Anesthesia Postprocedure Evaluation (Signed)
Anesthesia Post Note  Patient: Jeanne Lawson  Procedure(s) Performed: Procedure(s) (LRB): MRI OF BRAIN WITHOUT CONTRAST (N/A)  Patient location during evaluation: PACU Anesthesia Type: General Level of consciousness: awake and alert Pain management: pain level controlled Vital Signs Assessment: post-procedure vital signs reviewed and stable Respiratory status: spontaneous breathing, nonlabored ventilation, respiratory function stable and patient connected to nasal cannula oxygen Cardiovascular status: blood pressure returned to baseline and stable Postop Assessment: no signs of nausea or vomiting Anesthetic complications: no Comments: Extubated immediately on arrival to pacu, doing great thereafter    Last Vitals:  Filed Vitals:   03/15/15 1530 03/15/15 1543  BP: 155/85 143/64  Pulse: 72 71  Temp:    Resp: 18 19    Last Pain:  Filed Vitals:   03/15/15 1559  PainSc: 0-No pain                 Zenaida Deed

## 2015-03-15 NOTE — Progress Notes (Signed)
Pt in short stay for MRI with anesthesia.  Pt had hot tea and a "small pancake" at 0700 this AM.  Dr. Linna Caprice made aware, okay to proceed with MRI after 1 pm.

## 2015-03-15 NOTE — Progress Notes (Signed)
Patient arrived back from MRI. Patient denied any pain with little soreness from throat. Report received from PACU RN at this time. MD aware of condition after intubation per PACU RN. TELE repplied and confirmed. Will continue to monitor.  Ave Filter, RN

## 2015-03-15 NOTE — Progress Notes (Signed)
Notify patient of MRI schedule at 1300 today. Patient also requested RN to call Dr. [name] and notify MD of patient's admission at the hospital. Unable to locate dr. Katherina Right for pt but pt already called MD.   Ave Filter, RN

## 2015-03-15 NOTE — Care Management Note (Signed)
Case Management Note  Patient Details  Name: Jeanne Lawson MRN: [id number] Date of Birth: Jun 29, 1945  Subjective/Objective:                    Action/Plan: Patient presented with dysphasia.  Will follow for discharge needs pending PT/OT/SLP evals and physician orders.  Expected Discharge Date:                  Expected Discharge Plan:     In-House Referral:     Discharge planning Services     Post Acute Care Choice:    Choice offered to:     DME Arranged:    DME Agency:     HH Arranged:    HH Agency:     Status of Service:  In process, will continue to follow  Medicare Important Message Given:    Date Medicare IM Given:    Medicare IM give by:    Date Additional Medicare IM Given:    Additional Medicare Important Message give by:     If discussed at [place] of Stay Meetings, dates discussed:    Additional CommentsRolm Baptise, RN 03/15/2015, 11:42 AM [phone]

## 2015-03-15 NOTE — Progress Notes (Signed)
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 At that time, the patient also felt that she had some left arm weakness which she states she has residual weakness from previous stroke. She denied visual changes, syncope, or other focal deficits. She stated that the episode lasted over an hour but has resolved upon arrival to the emergency department. CT of brain was negative. Neurology was consulted.  Assessment/Plan: Acute nonhemorrhagic stroke -appreciate neurology evaluation; case was discussed with neurology 03/12/15 -required general anesthesia for MRI brain -03/15/15 MR brain-- scattered left PCA infarcts; stable bilateral ICA teminus aneursm -??low BP at home with metoprolol and lisinopril/HCTZ-->SBP in hospital 100-110 with only metoprolol -PT/OT/ST  -Hemoglobin A1c--5.5 -LDL 72 -Echocardiogram --EF 60-65%, grade 1 DD, trivial TR, normal RV Hypertension  -Continue metoprolol succinate 25 mg bid -Hold lisinopril/HCTZ for now--blood pressure controlled Hyperlipidemia  -Continue Lipitor  History of carotid/intracranial aneurysm status post coil embolization  -Patient has been instructed to continue aspirin and Plavix 37.5 mg daily  Coronary artery disease  -EKG without any concerning ischemic changes  -No angina  -Continue antiplatelet therapy, metoprolol succinate, and Lipitor  History of tobacco abuse/COPD -Patient quit approximately 1 year ago after 50 pack years  -Presently stable on room air  Thrombocytopenia -chronic -monitor for signs of bleeding Leg pain and edema -duplex r/o DVT--neg -urine protein creatinine  ratio--0.03 Hypokalemia -Replete -Check magnesium--2.0   Family Communication: Daughter updated at beside 2/20 Disposition Plan: Home when cleared by neurology    Procedures/Studies: Ct Angio Head W/cm &/or Wo Cm  03/01/2015  CLINICAL DATA:  Three-month follow-up of pipeline stent for treatment of aneurysm. EXAM: CT ANGIOGRAPHY HEAD AND NECK TECHNIQUE: Multidetector CT imaging of the head and neck was performed using the standard protocol during bolus administration of intravenous contrast. Multiplanar CT image reconstructions and MIPs were obtained to evaluate the vascular anatomy. Carotid stenosis measurements (when applicable) are obtained utilizing NASCET criteria, using the distal internal carotid diameter as the denominator. CONTRAST:  60mL OMNIPAQUE IOHEXOL 350 MG/ML SOLN COMPARISON:  Angiography 11/18/2014 FINDINGS: CT HEAD The brainstem and cerebellum are normal. Pipeline stent devices are present in the anterior circulation at the skullbase bilaterally. Extensive chronic small vessel ischemic changes are again seen throughout the cerebral hemispheric white matter. No large vessel territory no hydrocephalus or extra-axial fluid. The calvarium is unremarkable. Infarction. CTA NECK Aortic arch: Atherosclerosis of the aorta but no aneurysm or dissection. Branching pattern of the brachiocephalic vessels from the arch is normal with a common origin of the innominate artery and left common carotid artery. Right carotid system: Common carotid artery widely patent to the bifurcation region. Mild atherosclerosis of the carotid bifurcation but no stenosis of the ICA lumen beyond that of the more distal normal cervical ICA and therefore no stenosis. Fusiform aneurysmal dilatation of the ICA proximal to the skullbase with maximal diameter of 8.5 mm. Similar the previous angiogram images. Left carotid system: Common carotid artery widely patent to the bifurcation region. Mild atherosclerotic disease at  the carotid bifurcation and proximal ICA bulb but no stenosis. More distal cervical ICA is within normal limits. Vertebral arteries:Both vertebral artery origins are widely patent. Both vertebral arteries widely patent through the cervical region without  evidence of focal stenosis or dissection. Skeleton: Mild spondylosis Other neck: No soft tissue neck lesion.  Upper lungs are clear. CTA HEAD Anterior circulation: Right pipeline stent beginning at the apex of the siphon and extending into the right M1 segment. Aneurysm of the supra clinoid ICA projecting posteriorly shows flow, with maximal diameter of 8.6 mm. Anterior and middle cerebral arteries in the branch vessels appear otherwise normal. Pipeline stent on the left begins at the junction of the siphon in the supra clinoid ICA an extends into the M1 segment. Aneurysm projecting posteriorly from the site of origin of the anterior cerebral artery shows flow, measuring maximally 6.9 mm in diameter. More distal branch vessels are patent. Posterior circulation: Both vertebral arteries are patent to the basilar with the left being dominant. No basilar stenosis. Posterior circulation branch vessels are normal. Venous sinuses: Patent and normal Anatomic variants: None significant Delayed phase: No abnormal enhancement IMPRESSION: No atherosclerotic disease at the carotid bifurcations of significance. 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 Bilateral pipeline stent placement. Persistent contrast opacification of the right supra clinoid ICA aneurysm with maximal diameter measuring 8.6 mm. Persisting contrast opacification of the left A1 aneurysm with maximal transverse diameter of 6.9 mm. Electronically Signed   By: Nelson Chimes M.D.   On: 03/01/2015 15:04   Dg Chest 2 View  03/12/2015  CLINICAL DATA:  TIA/possible stroke. Three brain aneurysms 3 prior surgeries. EXAM: CHEST  2 VIEW COMPARISON:   11/19/2014 and 11/22/2014 FINDINGS: Lungs are adequately inflated without focal consolidation or effusion. There is mild stable cardiomegaly. There is minimal calcified plaque over the thoracic aorta. There are mild degenerative changes of the spine. IMPRESSION: No active cardiopulmonary disease. Mild stable cardiomegaly. Electronically Signed   By: Marin Olp M.D.   On: 03/12/2015 21:25   Ct Head Wo Contrast  03/12/2015  CLINICAL DATA:  Change in coordination.  History of stroke EXAM: CT HEAD WITHOUT CONTRAST TECHNIQUE: Contiguous axial images were obtained from the base of the skull through the vertex without intravenous contrast. COMPARISON:  03/01/2015 FINDINGS: Skull and Sinuses:Negative for fracture or destructive process. The visualized mastoids, middle ears, and imaged paranasal sinuses are clear. Visualized orbits: Negative. Brain: No evidence of acute infarction, hemorrhage, hydrocephalus, or mass lesion/mass effect. Left A1 and right supraclinoid ICA aneurysms which have encountered with stents. There has been recent CTA evaluation which shows continued opacification of these aneurysm sacs. There is no evidence of subarachnoid hemorrhage or brain edema. Advanced chronic small vessel disease with confluent ischemic gliosis throughout the deep cerebral white matter and lacunar infarcts in the bilateral corona radiata and right centrum semiovale. Generalized atrophy. IMPRESSION: 1. No acute finding or change from prior. 2. Advanced chronic small vessel disease. 3. Treated intracranial aneurysms, reference CTA 03/01/2015. Electronically Signed   By: Monte Fantasia M.D.   On: 03/12/2015 15:46   Ct Angio Neck W/cm &/or Wo/cm  03/01/2015  CLINICAL DATA:  Three-month follow-up of pipeline stent for treatment of aneurysm. EXAM: CT ANGIOGRAPHY HEAD AND NECK TECHNIQUE: Multidetector CT imaging of the head and neck was performed using the standard protocol during bolus administration of intravenous contrast.  Multiplanar CT image reconstructions and MIPs were obtained to evaluate the vascular anatomy. Carotid stenosis measurements (when applicable) are obtained utilizing NASCET criteria, using the distal internal carotid diameter as the denominator. CONTRAST:  86mL OMNIPAQUE IOHEXOL 350 MG/ML SOLN COMPARISON:  Angiography 11/18/2014 FINDINGS: CT HEAD The brainstem  and cerebellum are normal. Pipeline stent devices are present in the anterior circulation at the skullbase bilaterally. Extensive chronic small vessel ischemic changes are again seen throughout the cerebral hemispheric white matter. No large vessel territory no hydrocephalus or extra-axial fluid. The calvarium is unremarkable. Infarction. CTA NECK Aortic arch: Atherosclerosis of the aorta but no aneurysm or dissection. Branching pattern of the brachiocephalic vessels from the arch is normal with a common origin of the innominate artery and left common carotid artery. Right carotid system: Common carotid artery widely patent to the bifurcation region. Mild atherosclerosis of the carotid bifurcation but no stenosis of the ICA lumen beyond that of the more distal normal cervical ICA and therefore no stenosis. Fusiform aneurysmal dilatation of the ICA proximal to the skullbase with maximal diameter of 8.5 mm. Similar the previous angiogram images. Left carotid system: Common carotid artery widely patent to the bifurcation region. Mild atherosclerotic disease at the carotid bifurcation and proximal ICA bulb but no stenosis. More distal cervical ICA is within normal limits. Vertebral arteries:Both vertebral artery origins are widely patent. Both vertebral arteries widely patent through the cervical region without evidence of focal stenosis or dissection. Skeleton: Mild spondylosis Other neck: No soft tissue neck lesion.  Upper lungs are clear. CTA HEAD Anterior circulation: Right pipeline stent beginning at the apex of the siphon and extending into the right M1  segment. Aneurysm of the supra clinoid ICA projecting posteriorly shows flow, with maximal diameter of 8.6 mm. Anterior and middle cerebral arteries in the branch vessels appear otherwise normal. Pipeline stent on the left begins at the junction of the siphon in the supra clinoid ICA an extends into the M1 segment. Aneurysm projecting posteriorly from the site of origin of the anterior cerebral artery shows flow, measuring maximally 6.9 mm in diameter. More distal branch vessels are patent. Posterior circulation: Both vertebral arteries are patent to the basilar with the left being dominant. No basilar stenosis. Posterior circulation branch vessels are normal. Venous sinuses: Patent and normal Anatomic variants: None significant Delayed phase: No abnormal enhancement IMPRESSION: No atherosclerotic disease at the carotid bifurcations of significance. 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 Bilateral pipeline stent placement. Persistent contrast opacification of the right supra clinoid ICA aneurysm with maximal diameter measuring 8.6 mm. Persisting contrast opacification of the left A1 aneurysm with maximal transverse diameter of 6.9 mm. Electronically Signed   By: Nelson Chimes M.D.   On: 03/01/2015 15:04   Mr Virgel Paling Wo Contrast  03/15/2015  CLINICAL DATA:  70 year old female with acute onset altered mental status and speech changes suspicious for acute stroke. Initial encounter. 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 Study performed under anesthesia. EXAM: MRI HEAD WITHOUT CONTRAST MRA HEAD WITHOUT CONTRAST TECHNIQUE: Multiplanar, multiecho pulse sequences of the brain and surrounding structures were obtained without intravenous contrast. Angiographic images of the head were obtained using MRA technique without contrast. COMPARISON:   Head CT without contrast 03/12/2015. CTA head and neck 03/01/2015. Brain MRI and intracranial MRA 05/02/2014 FINDINGS: MRI HEAD FINDINGS Major intracranial vascular flow voids are stable since 2016. Stable cerebral volume. Multiple scattered small round foci of restricted diffusion in the left periatrial white matter and along the lateral aspect of the left occipital horn in the left occipital lobe. See series 4. Chronic underlying T2 and FLAIR hyperintensity in this region. No associated mass effect or  acute hemorrhage. No other restricted diffusion identified. Multiple chronic but new micro hemorrhages in the brain on series 9, including in the left caudothalamic notch, right hippocampal formation, both occipital lobes (not associated with the acute diffusion changes), posterior left temporal lobe and posterior left temporal lobe. Expected evolution of the left corona radiata lacunar infarct seen in 2016 with advanced bilateral chronic white matter small vessel disease. Multiple chronic bilateral deep gray matter lacunar infarcts. Patchy T2 hyperintensity in the pons. The cerebellum is relatively spared. No midline shift, mass effect, evidence of mass lesion, ventriculomegaly, extra-axial collection or acute intracranial hemorrhage. Cervicomedullary junction and pituitary are within normal limits. Negative visualized cervical spine. Normal bone marrow signal. Visible internal auditory structures appear normal. Mastoids are clear. Fluid level in the nasopharynx in the setting of general anesthesia. Regressed paranasal sinus mucosal thickening and opacification. Negative orbit and scalp soft tissues. MRA HEAD FINDINGS Stable antegrade flow in the posterior circulation with dominant distal left vertebral artery, an the right functionally terminates in PICA. Stable basilar artery flow signal without stenosis. Stable SCA and PCA origins, with shared and mildly dolichoectasia attic left PCA/ SCA origin re- demonstrated.  Both posterior communicating arteries are visible today and remain patent. Bilateral PCA branches are within normal limits. Stable antegrade flow in both ICA siphons. 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 Mild susceptibility artifact related to bilateral distal ICA pipeline stents re- demonstrated. That on the left extends from distal to the left ophthalmic origin to the left proximal M1 terminating near the left anterior temporal artery origin. On the right the stent extends from the anterior genu to the proximal right M1. Superiorly directed left ICA terminus and posteriorly and laterally directed right ICA terminus saccular aneurysms re- demonstrated and stable from the CTA earlier this month measuring 7-9 mm each. MCA and ACA origins remain patent. Anterior communicating artery is within normal limits. Mildly to moderately attenuated left ACA distal A2 segment appears stable. Both MCA bifurcations remain patent. Visualized bilateral MCA branches are stable. IMPRESSION: 1. 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 No associated hemorrhage or mass effect. 2. Advanced underlying chronic small vessel disease, with progression since 2016 in the form of scattered chronic micro hemorrhages. 3. No evidence of emergent large vessel occlusion. Bilateral ICA terminus aneurysms and other vascular findings appears stable since the recent 03/01/2015 CTA head and neck (please see that report). Electronically Signed   By: Genevie Ann M.D.   On: 03/15/2015 15:09   Mr Brain Wo Contrast  03/15/2015  CLINICAL DATA:  70 year old female with acute onset altered mental status and speech changes suspicious for acute stroke. Initial encounter. 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 Study performed under anesthesia. EXAM: MRI HEAD WITHOUT CONTRAST MRA HEAD  WITHOUT CONTRAST TECHNIQUE: Multiplanar, multiecho pulse sequences of the brain and surrounding structures were obtained without intravenous contrast. Angiographic images of the head were obtained using MRA technique without contrast. COMPARISON:  Head CT without contrast 03/12/2015. CTA head and neck 03/01/2015. Brain MRI and intracranial MRA 05/02/2014 FINDINGS: MRI HEAD FINDINGS Major intracranial vascular flow voids are stable since 2016. Stable cerebral volume. Multiple scattered small round foci of restricted diffusion in the left periatrial white matter and along the lateral aspect of the left occipital horn in the left occipital lobe. See series 4. Chronic underlying T2 and FLAIR hyperintensity in this  region. No associated mass effect or acute hemorrhage. No other restricted diffusion identified. Multiple chronic but new micro hemorrhages in the brain on series 9, including in the left caudothalamic notch, right hippocampal formation, both occipital lobes (not associated with the acute diffusion changes), posterior left temporal lobe and posterior left temporal lobe. Expected evolution of the left corona radiata lacunar infarct seen in 2016 with advanced bilateral chronic white matter small vessel disease. Multiple chronic bilateral deep gray matter lacunar infarcts. Patchy T2 hyperintensity in the pons. The cerebellum is relatively spared. No midline shift, mass effect, evidence of mass lesion, ventriculomegaly, extra-axial collection or acute intracranial hemorrhage. Cervicomedullary junction and pituitary are within normal limits. Negative visualized cervical spine. Normal bone marrow signal. Visible internal auditory structures appear normal. Mastoids are clear. Fluid level in the nasopharynx in the setting of general anesthesia. Regressed paranasal sinus mucosal thickening and opacification. Negative orbit and scalp soft tissues. MRA HEAD FINDINGS Stable antegrade flow in the posterior circulation with  dominant distal left vertebral artery, an the right functionally terminates in PICA. Stable basilar artery flow signal without stenosis. Stable SCA and PCA origins, with shared and mildly dolichoectasia attic left PCA/ SCA origin re- demonstrated. Both posterior communicating arteries are visible today and remain patent. Bilateral PCA branches are within normal limits. Stable antegrade flow in both ICA siphons. 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 Mild susceptibility artifact related to bilateral distal ICA pipeline stents re- demonstrated. That on the left extends from distal to the left ophthalmic origin to the left proximal M1 terminating near the left anterior temporal artery origin. On the right the stent extends from the anterior genu to the proximal right M1. Superiorly directed left ICA terminus and posteriorly and laterally directed right ICA terminus saccular aneurysms re- demonstrated and stable from the CTA earlier this month measuring 7-9 mm each. MCA and ACA origins remain patent. Anterior communicating artery is within normal limits. Mildly to moderately attenuated left ACA distal A2 segment appears stable. Both MCA bifurcations remain patent. Visualized bilateral MCA branches are stable. IMPRESSION: 1. 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 No associated hemorrhage or mass effect. 2. Advanced underlying chronic small vessel disease, with progression since 2016 in the form of scattered chronic micro hemorrhages. 3. No evidence of emergent large vessel occlusion. Bilateral ICA terminus aneurysms and other vascular findings appears stable since the recent 03/01/2015 CTA head and neck (please see that report). Electronically Signed   By: Genevie Ann M.D.   On: 03/15/2015 15:09   Ir Radiologist Eval & Mgmt  03/10/2015  EXAM: ESTABLISHED PATIENT OFFICE VISIT CHIEF COMPLAINT: Six-month follow-up following treatment of left  internal carotid artery terminal aneurysm. 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 The patient is accompanied by her daughter and granddaughter. Clinically the patient reports no significant headaches, nausea, vomiting, visual symptoms, loss of consciousness, seizures or incoordination. 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 Otherwise the patient is at home and able to cope independently. She does complain of increased bruisability especially over her torso. The patient however does report being depressed but without being weepy. This is corroborated by her daughter and granddaughter. Her appetite is normal.  She has gained about 10 pounds since the last procedure. Otherwise she reports no recent chills, fever or rigors. On close questioning, the patient does report spells of bleeding from her nostrils and also her mouth at least once. She denies any hematuria or black tarry stools. Her review of systems is essentially negative for pathologic symptomatology. Her present medications include albuterol inhaler, aspirin 81 mg, atorvastatin, Plavix, lisinopril hydrochlorothiazide combination, Toprol. Her allergies include Dilaudid, codeine, hydromorphone, latex, sulfa drugs. PHYSICAL EXAMINATION: Affect appropriate. In no acute distress. However does drag her left foot on walking with a cane. This apparently has remained stable since her most recent stroke. ASSESSMENT AND PLAN: The patient is awaiting the results of her CTA scan of her head and neck which she had just a few minutes ago. The results are not available at this time. She did develop extravasation at the site of the needle stick. This has been evaluated and treated with  ice pack. Patient will undergo a P2Y12 study today. A follow-up MRI/MRA of the brain with sedation will be scheduled for 6 months time. The results of the CT angiogram done today will hopefully be available for review and reported to the patient. She has been asked to continue with active physical therapy as tolerated. She has scheduled an appointment with outpatient physical therapy. Questions were answered to her satisfaction. They were asked to call should they have any concerns or questions. Electronically Signed   By: [name] M.D.   On: 03/01/2015 15:01         Subjective: Patient denies fevers, chills, headache, chest pain, dyspnea, nausea, vomiting, diarrhea, abdominal pain, dysuria, hematuria   Objective: Filed Vitals:   03/15/15 1617 03/15/15 1621 03/15/15 1704 03/15/15 1819  BP: 149/84  143/77 134/94  Pulse: 66 67 90 64  Temp:   97.8 F (36.6 C) 98 F (36.7 C)  TempSrc:   Oral Oral  Resp: 15 20 20 20   Height:      Weight:      SpO2: 99% 99% 98% 99%    Intake/Output Summary (Last 24 hours) at 03/15/15 1856 Last data filed at 03/15/15 1230  Gross per 24 hour  Intake    600 ml  Output      0 ml  Net    600 ml   Weight change:  Exam:   General:  Pt is alert, follows commands appropriately, not in acute distress  HEENT: No icterus, No thrush, No neck mass, [place]/AT  Cardiovascular: RRR, S1/S2, no rubs, no gallops  Respiratory: CTA bilaterally, no wheezing, no crackles, no rhonchi  Abdomen: Soft/+BS, non tender, non distended, no guarding  Extremities: 1+LE edema, No lymphangitis, No petechiae, No rashes, no synovitis  Data Reviewed: Basic Metabolic Panel:  Recent Labs Lab 03/12/15 1337 03/12/15 1349 03/14/15 0413 03/15/15 0537 03/15/15 1603  NA 138 141 142 141  --   K 3.8 3.8 3.3* 3.7  --   CL 103 99* 105 107  --   CO2 25  --  29 26  --   GLUCOSE 102* 98 94 99  --   BUN 8 9 12 8   --   CREATININE 0.85 0.80 0.79 0.77 0.82  CALCIUM 9.0  --   8.5* 8.4*  --   MG  --   --   --  2.0  --    Liver Function Tests:  Recent Labs Lab 03/12/15 1337 03/14/15 0413  AST 20 16  ALT 13* 12*  ALKPHOS 42  35*  BILITOT 0.9 0.8  PROT 6.9 5.9*  ALBUMIN 3.5 3.1*   No results for input(s): LIPASE, AMYLASE in the last 168 hours. No results for input(s): AMMONIA in the last 168 hours. CBC:  Recent Labs Lab 03/12/15 1337 03/12/15 1349 03/15/15 0537  WBC 4.0  --  3.3*  NEUTROABS 2.6  --   --   HGB 12.1 12.2 11.1*  HCT 35.7* 36.0 32.3*  MCV 85.8  --  85.0  PLT 125*  --  107*   Cardiac Enzymes: No results for input(s): CKTOTAL, CKMB, CKMBINDEX, TROPONINI in the last 168 hours. BNP: Invalid input(s): POCBNP CBG: No results for input(s): GLUCAP in the last 168 hours.  No results found for this or any previous visit (from the past 240 hour(s)).   Scheduled Meds: . aspirin  81 mg Oral Daily  . atorvastatin  40 mg Oral q1800  . clopidogrel  37.5 mg Oral Daily  . enoxaparin (LOVENOX) injection  40 mg Subcutaneous Q24H  . levETIRAcetam  250 mg Oral BID  . metoprolol succinate  25 mg Oral BID   Continuous Infusions:    Lehi Phifer, DO  [facility] Pager [phone]  If 7PM-7AM, please contact night-coverage [email or web address] Password [facility] 03/15/2015, 6:56 PM   LOS: 0 days

## 2015-03-15 NOTE — Progress Notes (Signed)
Patient left unit for MRI at this time.   Ave Filter, RN

## 2015-03-16 ENCOUNTER — Ambulatory Visit: Payer: Medicare Other | Admitting: Physical Therapy

## 2015-03-16 ENCOUNTER — Encounter (HOSPITAL_COMMUNITY): Payer: Self-pay | Admitting: Radiology

## 2015-03-16 DIAGNOSIS — R4702 Dysphasia: Secondary | ICD-10-CM

## 2015-03-16 NOTE — Progress Notes (Signed)
STROKE TEAM PROGRESS NOTE   HISTORY OF PRESENT ILLNESS Jeanne Lawson is an 70 y.o. female with multiple stroke risk factors who was at home 03/12/2015 when at 1 "she felt fuzzy headed and not right. Daughter noted she was having a hard time getting her thoughts out". This lasted until 12. She was brought to the hospital for fear of her having a stroke. Currently symptoms have fully cleared. Patient was not administered TPA . She was admitted for further evaluation and treatment.   SUBJECTIVE (INTERVAL HISTORY) Her  daughter is at the bedside.  Overall she feels her condition is gradually improving.  No definite seizure activity   OBJECTIVE Temp:  [97.7 F (36.5 C)-98.8 F (37.1 C)] 98.4 F (36.9 C) (02/21 1039) Pulse Rate:  [50-90] 50 (02/21 1039) Cardiac Rhythm:  [-] Normal sinus rhythm;Bundle branch block (02/20 1900) Resp:  [15-31] 18 (02/21 1039) BP: (122-155)/(60-94) 134/68 mmHg (02/21 1039) SpO2:  [95 %-100 %] 98 % (02/21 1039)  CBC:  Recent Labs Lab 03/12/15 1337 03/12/15 1349 03/15/15 0537  WBC 4.0  --  3.3*  NEUTROABS 2.6  --   --   HGB 12.1 12.2 11.1*  HCT 35.7* 36.0 32.3*  MCV 85.8  --  85.0  PLT 125*  --  107*    Basic Metabolic Panel:  Recent Labs Lab 03/14/15 0413 03/15/15 0537 03/15/15 1603  NA 142 141  --   K 3.3* 3.7  --   CL 105 107  --   CO2 29 26  --   GLUCOSE 94 99  --   BUN 12 8  --   CREATININE 0.79 0.77 0.82  CALCIUM 8.5* 8.4*  --   MG  --  2.0  --     Lipid Panel:    Component Value Date/Time   CHOL 158 03/13/2015 0530   TRIG 145 03/13/2015 0530   HDL 66 03/13/2015 0530   CHOLHDL 2.4 03/13/2015 0530   VLDL 29 03/13/2015 0530   LDLCALC 63 03/13/2015 0530   HgbA1c:  Lab Results  Component Value Date   HGBA1C 5.5 03/13/2015   Urine Drug Screen:    Component Value Date/Time   LABOPIA NONE DETECTED 03/12/2015 1415   COCAINSCRNUR NONE DETECTED 03/12/2015 1415   LABBENZ NONE DETECTED 03/12/2015 1415   AMPHETMU NONE DETECTED  03/12/2015 1415   THCU NONE DETECTED 03/12/2015 1415   LABBARB NONE DETECTED 03/12/2015 1415      IMAGING  Mr Jodene Nam Head Wo Contrast  03/15/2015  CLINICAL DATA:  70 year old female with acute onset altered mental status and speech changes suspicious for acute stroke. Initial encounter. 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 Study performed under anesthesia. EXAM: MRI HEAD WITHOUT CONTRAST MRA HEAD WITHOUT CONTRAST TECHNIQUE: Multiplanar, multiecho pulse sequences of the brain and surrounding structures were obtained without intravenous contrast. Angiographic images of the head were obtained using MRA technique without contrast. COMPARISON:  Head CT without contrast 03/12/2015. CTA head and neck 03/01/2015. Brain MRI and intracranial MRA 05/02/2014 FINDINGS: MRI HEAD FINDINGS Major intracranial vascular flow voids are stable since 2016. Stable cerebral volume. Multiple scattered small round foci of restricted diffusion in the left periatrial white matter and along the lateral aspect of the left occipital horn in the left occipital lobe. See series 4. Chronic underlying T2 and FLAIR hyperintensity in this region. No associated mass effect or acute hemorrhage. No other restricted diffusion identified. Multiple chronic  but new micro hemorrhages in the brain on series 9, including in the left caudothalamic notch, right hippocampal formation, both occipital lobes (not associated with the acute diffusion changes), posterior left temporal lobe and posterior left temporal lobe. Expected evolution of the left corona radiata lacunar infarct seen in 2016 with advanced bilateral chronic white matter small vessel disease. Multiple chronic bilateral deep gray matter lacunar infarcts. Patchy T2 hyperintensity in the pons. The cerebellum is relatively spared. No midline shift, mass effect, evidence of mass lesion,  ventriculomegaly, extra-axial collection or acute intracranial hemorrhage. Cervicomedullary junction and pituitary are within normal limits. Negative visualized cervical spine. Normal bone marrow signal. Visible internal auditory structures appear normal. Mastoids are clear. Fluid level in the nasopharynx in the setting of general anesthesia. Regressed paranasal sinus mucosal thickening and opacification. Negative orbit and scalp soft tissues. MRA HEAD FINDINGS Stable antegrade flow in the posterior circulation with dominant distal left vertebral artery, an the right functionally terminates in PICA. Stable basilar artery flow signal without stenosis. Stable SCA and PCA origins, with shared and mildly dolichoectasia attic left PCA/ SCA origin re- demonstrated. Both posterior communicating arteries are visible today and remain patent. Bilateral PCA branches are within normal limits. Stable antegrade flow in both ICA siphons. 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 Mild susceptibility artifact related to bilateral distal ICA pipeline stents re- demonstrated. That on the left extends from distal to the left ophthalmic origin to the left proximal M1 terminating near the left anterior temporal artery origin. On the right the stent extends from the anterior genu to the proximal right M1. Superiorly directed left ICA terminus and posteriorly and laterally directed right ICA terminus saccular aneurysms re- demonstrated and stable from the CTA earlier this month measuring 7-9 mm each. MCA and ACA origins remain patent. Anterior communicating artery is within normal limits. Mildly to moderately attenuated left ACA distal A2 segment appears stable. Both MCA bifurcations remain patent. Visualized bilateral MCA branches are stable. IMPRESSION: 1. 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 No associated hemorrhage or mass effect. 2. Advanced underlying  chronic small vessel disease, with progression since 2016 in the form of scattered chronic micro hemorrhages. 3. No evidence of emergent large vessel occlusion. Bilateral ICA terminus aneurysms and other vascular findings appears stable since the recent 03/01/2015 CTA head and neck (please see that report). Electronically Signed   By: Genevie Ann M.D.   On: 03/15/2015 15:09   Mr Brain Wo Contrast  03/15/2015  CLINICAL DATA:  70 year old female with acute onset altered mental status and speech changes suspicious for acute stroke. Initial encounter. 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 Study performed under anesthesia. EXAM: MRI HEAD WITHOUT CONTRAST MRA HEAD WITHOUT CONTRAST TECHNIQUE: Multiplanar, multiecho pulse sequences of the brain and surrounding structures were obtained without intravenous contrast. Angiographic images of the head were obtained using MRA technique without contrast. COMPARISON:  Head CT without contrast 03/12/2015. CTA head and neck 03/01/2015. Brain MRI and intracranial MRA 05/02/2014 FINDINGS: MRI HEAD FINDINGS Major intracranial vascular flow voids are stable since 2016. Stable cerebral volume. Multiple scattered small round foci of restricted diffusion in the left periatrial white matter and along the lateral aspect of the left occipital horn in the left occipital lobe. See series 4. Chronic underlying T2 and FLAIR hyperintensity in this region. No associated mass effect or acute hemorrhage. No  other restricted diffusion identified. Multiple chronic but new micro hemorrhages in the brain on series 9, including in the left caudothalamic notch, right hippocampal formation, both occipital lobes (not associated with the acute diffusion changes), posterior left temporal lobe and posterior left temporal lobe. Expected evolution of the left corona radiata lacunar infarct seen in 2016 with  advanced bilateral chronic white matter small vessel disease. Multiple chronic bilateral deep gray matter lacunar infarcts. Patchy T2 hyperintensity in the pons. The cerebellum is relatively spared. No midline shift, mass effect, evidence of mass lesion, ventriculomegaly, extra-axial collection or acute intracranial hemorrhage. Cervicomedullary junction and pituitary are within normal limits. Negative visualized cervical spine. Normal bone marrow signal. Visible internal auditory structures appear normal. Mastoids are clear. Fluid level in the nasopharynx in the setting of general anesthesia. Regressed paranasal sinus mucosal thickening and opacification. Negative orbit and scalp soft tissues. MRA HEAD FINDINGS Stable antegrade flow in the posterior circulation with dominant distal left vertebral artery, an the right functionally terminates in PICA. Stable basilar artery flow signal without stenosis. Stable SCA and PCA origins, with shared and mildly dolichoectasia attic left PCA/ SCA origin re- demonstrated. Both posterior communicating arteries are visible today and remain patent. Bilateral PCA branches are within normal limits. Stable antegrade flow in both ICA siphons. 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 Mild susceptibility artifact related to bilateral distal ICA pipeline stents re- demonstrated. That on the left extends from distal to the left ophthalmic origin to the left proximal M1 terminating near the left anterior temporal artery origin. On the right the stent extends from the anterior genu to the proximal right M1. Superiorly directed left ICA terminus and posteriorly and laterally directed right ICA terminus saccular aneurysms re- demonstrated and stable from the CTA earlier this month measuring 7-9 mm each. MCA and ACA origins remain patent. Anterior communicating artery is within normal limits. Mildly to moderately attenuated left ACA distal  A2 segment appears stable. Both MCA bifurcations remain patent. Visualized bilateral MCA branches are stable. IMPRESSION: 1. 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 No associated hemorrhage or mass effect. 2. Advanced underlying chronic small vessel disease, with progression since 2016 in the form of scattered chronic micro hemorrhages. 3. No evidence of emergent large vessel occlusion. Bilateral ICA terminus aneurysms and other vascular findings appears stable since the recent 03/01/2015 CTA head and neck (please see that report). Electronically Signed   By: Genevie Ann M.D.   On: 03/15/2015 15:09   Bilateral lower extremity Dopplers negative for DVT  2D Echocardiogram  - Left ventricle: The cavity size was normal. Wall thickness wasincreased in a pattern of moderate LVH. Systolic function wasnormal. The estimated ejection fraction was in the range of 60%to 65%. Wall motion was normal; there were no region l wallmotion abnormalities. Doppler parameters are consistent withabnormal left ventricular relaxation (grade 1 diastolic Dysfunction).  EEG The background rhythm was normal 9-10 Hz alpha . A few intermittent left temporal sharps were seen with phase reversals. No definite evidence of electrographic seizures were noted during this recording. abnormal  EEG, with a few intermittent abnormal discharges in the left temporal region, could suggest an underlying area of brain irritability in the left temporal area. Clinical correlation is recommended .   PHYSICAL EXAM Pleasant middle-aged Caucasian lady not in distress. She has a mild rash beneath her eyes with ecchymosis. . Afebrile. Head is nontraumatic. Neck is supple without bruit.  Cardiac exam no murmur or gallop. Lungs are clear to auscultation. Distal pulses are well felt. Neurological exam ;  Awake alert oriented 3 normal speech and language function. Intact attention registration and recall. Extraocular movements are  pharyngeal or nystagmus. Fundi were not visualized. Vision acuity seems adequate. Mild left lower facial asymmetry when she smiles. Tongue is midline. Motor system exam no upper or lower extremity drift but mild weakness of left grip and intrinsic hand muscles. Mild weakness of left hip flexors and ankle dorsiflexors. Diminished fine finger movements on the left. Orbits right over left upper extremity. Deep tendon reflexes are 1+ symmetric. Plantars are downgoing. Gait was not tested. ASSESSMENT/PLAN Ms. Jeanne Lawson is a 70 y.o. 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 She did not receive IV t-PA.   Stroke:  Scattered left PCA infarcts small and subcortical likely secondary to small vessel disease. Likely clinically silent and asymptomatic. 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 Progressed small vessel disease  MRA  No large vessel occlusion  Carotid Doppler  Ordered  2D Echo  No source of embolus  Lower extremity Dopplers negative  LDL 63  HgbA1c 5.5  Lovenox 40 mg sq daily for VTE prophylaxis  Diet Heart Room service appropriate?: Yes; Fluid consistency:: Thin  aspirin 81 mg daily and clopidogrel 75 mg daily prior to admission, now on aspirin 81 mg daily and clopidogrel 75 mg daily  Patient counseled to be compliant with her antithrombotic medications  Ongoing aggressive stroke risk factor management  Therapy recommendations:  No therapy needs Disposition:  Likely home Seizures  EEG  few intermittent left temporal sharps but no seizure activity  Started on KEPPRA but no clear symptomatology for seizures hence we'll discontinue  Hypertension   Stable Permissive  hypertension (OK if < 220/120) but gradually normalize in 5-7 days  Hyperlipidemia  Home meds:  Lipitor 40, resumed in hospital  LDL 63, goal < 70  Continue statin at discharge  Other Stroke Risk Factors  Advanced age  Cigarette smoker, quit smoking 10 months ago  Obesity, Body mass index is 30.08 kg/(m^2).   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 MRI scan does show tiny punctate white matter infarcts in the left PCA territory but this likely clinically silent. She remains at risk for recurrent strokes, TIAs, neurological worsening .He will undergo EEG shows some left temporal sharp waves clinical history is not suggestive of seizures hence we will discontinue Keppra. Continue aspirin and Plavix because of her intracranial biplane stent treatment of aneurysms. Check carotid ultrasound as it has been greater than 6 months since last study. Discharge home after that. Follow-up as an outpatient with Dr. [name] and in the stroke clinic in 2 months. I had a long discussion at the bedside with the patient and her daughter and answered questions  Antony Contras, MD To contact Stroke Continuity  provider, please refer to [email or web address] After hours, contact General Neurology

## 2015-03-16 NOTE — Progress Notes (Signed)
SLP Cancellation Note  Patient Details Name: Jeanne Lawson MRN: [id number] DOB: October 17, 1945   Cancelled treatment:       Reason Eval/Treat Not Completed: SLP screened, no needs identified, will sign off   [name] 03/16/2015, 2:26 PM

## 2015-03-16 NOTE — Progress Notes (Signed)
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 At that time, the patient also felt that she had some left arm weakness which she states she has residual weakness from previous stroke. She denied visual changes, syncope, or other focal deficits. She stated that the episode lasted over an hour but has resolved upon arrival to the emergency department. CT of brain was negative. Neurology was consulted.  Assessment/Plan: Acute nonhemorrhagic stroke -appreciate neurology evaluation; case was discussed with neurology 03/12/15 -required general anesthesia for MRI brain -03/15/15 MR brain-- scattered left PCA infarcts; stable bilateral ICA teminus aneursm -PT/OT/ST  -Hemoglobin A1c--5.5 -LDL 72 -Echocardiogram --EF 60-65%, grade 1 DD, trivial TR, normal RV -carotid duplex--pending Hypertension  -Continue metoprolol succinate 25 mg bid -Hold lisinopril/HCTZ for now--blood pressure controlled Hyperlipidemia  -Continue Lipitor  History of carotid/intracranial aneurysm status post coil embolization  -Patient has been instructed to continue aspirin and Plavix 37.5 mg daily  Coronary artery disease  -EKG without any concerning ischemic changes  -No angina  -Continue antiplatelet therapy, metoprolol succinate, and Lipitor  History of tobacco abuse/COPD -Patient quit approximately 1 year ago after 50 pack years  -Presently stable on room air  Thrombocytopenia -chronic -monitor for signs of bleeding Leg pain and edema -duplex r/o DVT--neg -urine protein creatinine ratio--0.03 Hypokalemia -Replete -Check magnesium--2.0   Family  Communication: Daughter updated at beside 2/21 Disposition Plan: Home 2/22 if carotid US unremarkable    Procedures/Studies: Ct Angio Head W/cm &/or Wo Cm  03/01/2015  CLINICAL DATA:  Three-month follow-up of pipeline stent for treatment of aneurysm. EXAM: CT ANGIOGRAPHY HEAD AND NECK TECHNIQUE: Multidetector CT imaging of the head and neck was performed using the standard protocol during bolus administration of intravenous contrast. Multiplanar CT image reconstructions and MIPs were obtained to evaluate the vascular anatomy. Carotid stenosis measurements (when applicable) are obtained utilizing NASCET criteria, using the distal internal carotid diameter as the denominator. CONTRAST:  74mL OMNIPAQUE IOHEXOL 350 MG/ML SOLN COMPARISON:  Angiography 11/18/2014 FINDINGS: CT HEAD The brainstem and cerebellum are normal. Pipeline stent devices are present in the anterior circulation at the skullbase bilaterally. Extensive chronic small vessel ischemic changes are again seen throughout the cerebral hemispheric white matter. No large vessel territory no hydrocephalus or extra-axial fluid. The calvarium is unremarkable. Infarction. CTA NECK Aortic arch: Atherosclerosis of the aorta but no aneurysm or dissection. Branching pattern of the brachiocephalic vessels from the arch is normal with a common origin of the innominate artery and left common carotid artery. Right carotid system: Common carotid artery widely patent to the bifurcation region. Mild atherosclerosis of the carotid bifurcation but no stenosis of the ICA lumen beyond that of the more distal normal cervical ICA and therefore no stenosis. Fusiform aneurysmal dilatation of the ICA proximal to the skullbase with maximal diameter of 8.5 mm. Similar the previous angiogram images. Left carotid system: Common carotid artery widely patent to the bifurcation region. Mild atherosclerotic disease at the carotid bifurcation and proximal ICA bulb but no stenosis.  More distal cervical ICA is within normal limits. Vertebral arteries:Both vertebral artery origins are widely patent. Both vertebral arteries widely patent through the cervical region without evidence of focal stenosis or dissection. Skeleton: Mild spondylosis Other  neck: No soft tissue neck lesion.  Upper lungs are clear. CTA HEAD Anterior circulation: Right pipeline stent beginning at the apex of the siphon and extending into the right M1 segment. Aneurysm of the supra clinoid ICA projecting posteriorly shows flow, with maximal diameter of 8.6 mm. Anterior and middle cerebral arteries in the branch vessels appear otherwise normal. Pipeline stent on the left begins at the junction of the siphon in the supra clinoid ICA an extends into the M1 segment. Aneurysm projecting posteriorly from the site of origin of the anterior cerebral artery shows flow, measuring maximally 6.9 mm in diameter. More distal branch vessels are patent. Posterior circulation: Both vertebral arteries are patent to the basilar with the left being dominant. No basilar stenosis. Posterior circulation branch vessels are normal. Venous sinuses: Patent and normal Anatomic variants: None significant Delayed phase: No abnormal enhancement IMPRESSION: No atherosclerotic disease at the carotid bifurcations of significance. 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 Bilateral pipeline stent placement. Persistent contrast opacification of the right supra clinoid ICA aneurysm with maximal diameter measuring 8.6 mm. Persisting contrast opacification of the left A1 aneurysm with maximal transverse diameter of 6.9 mm. Electronically Signed   By: Nelson Chimes M.D.   On: 03/01/2015 15:04   Dg Chest 2 View  03/12/2015  CLINICAL DATA:  TIA/possible stroke. Three brain aneurysms 3 prior surgeries. EXAM: CHEST  2 VIEW COMPARISON:  11/19/2014 and 11/22/2014 FINDINGS: Lungs are adequately  inflated without focal consolidation or effusion. There is mild stable cardiomegaly. There is minimal calcified plaque over the thoracic aorta. There are mild degenerative changes of the spine. IMPRESSION: No active cardiopulmonary disease. Mild stable cardiomegaly. Electronically Signed   By: Marin Olp M.D.   On: 03/12/2015 21:25   Ct Head Wo Contrast  03/12/2015  CLINICAL DATA:  Change in coordination.  History of stroke EXAM: CT HEAD WITHOUT CONTRAST TECHNIQUE: Contiguous axial images were obtained from the base of the skull through the vertex without intravenous contrast. COMPARISON:  03/01/2015 FINDINGS: Skull and Sinuses:Negative for fracture or destructive process. The visualized mastoids, middle ears, and imaged paranasal sinuses are clear. Visualized orbits: Negative. Brain: No evidence of acute infarction, hemorrhage, hydrocephalus, or mass lesion/mass effect. Left A1 and right supraclinoid ICA aneurysms which have encountered with stents. There has been recent CTA evaluation which shows continued opacification of these aneurysm sacs. There is no evidence of subarachnoid hemorrhage or brain edema. Advanced chronic small vessel disease with confluent ischemic gliosis throughout the deep cerebral white matter and lacunar infarcts in the bilateral corona radiata and right centrum semiovale. Generalized atrophy. IMPRESSION: 1. No acute finding or change from prior. 2. Advanced chronic small vessel disease. 3. Treated intracranial aneurysms, reference CTA 03/01/2015. Electronically Signed   By: Monte Fantasia M.D.   On: 03/12/2015 15:46   Ct Angio Neck W/cm &/or Wo/cm  03/01/2015  CLINICAL DATA:  Three-month follow-up of pipeline stent for treatment of aneurysm. EXAM: CT ANGIOGRAPHY HEAD AND NECK TECHNIQUE: Multidetector CT imaging of the head and neck was performed using the standard protocol during bolus administration of intravenous contrast. Multiplanar CT image reconstructions and MIPs were  obtained to evaluate the vascular anatomy. Carotid stenosis measurements (when applicable) are obtained utilizing NASCET criteria, using the distal internal carotid diameter as the denominator. CONTRAST:  61mL OMNIPAQUE IOHEXOL 350 MG/ML SOLN COMPARISON:  Angiography 11/18/2014 FINDINGS: CT HEAD The brainstem and cerebellum are normal. Pipeline stent devices are present in  the anterior circulation at the skullbase bilaterally. Extensive chronic small vessel ischemic changes are again seen throughout the cerebral hemispheric white matter. No large vessel territory no hydrocephalus or extra-axial fluid. The calvarium is unremarkable. Infarction. CTA NECK Aortic arch: Atherosclerosis of the aorta but no aneurysm or dissection. Branching pattern of the brachiocephalic vessels from the arch is normal with a common origin of the innominate artery and left common carotid artery. Right carotid system: Common carotid artery widely patent to the bifurcation region. Mild atherosclerosis of the carotid bifurcation but no stenosis of the ICA lumen beyond that of the more distal normal cervical ICA and therefore no stenosis. Fusiform aneurysmal dilatation of the ICA proximal to the skullbase with maximal diameter of 8.5 mm. Similar the previous angiogram images. Left carotid system: Common carotid artery widely patent to the bifurcation region. Mild atherosclerotic disease at the carotid bifurcation and proximal ICA bulb but no stenosis. More distal cervical ICA is within normal limits. Vertebral arteries:Both vertebral artery origins are widely patent. Both vertebral arteries widely patent through the cervical region without evidence of focal stenosis or dissection. Skeleton: Mild spondylosis Other neck: No soft tissue neck lesion.  Upper lungs are clear. CTA HEAD Anterior circulation: Right pipeline stent beginning at the apex of the siphon and extending into the right M1 segment. Aneurysm of the supra clinoid ICA projecting  posteriorly shows flow, with maximal diameter of 8.6 mm. Anterior and middle cerebral arteries in the branch vessels appear otherwise normal. Pipeline stent on the left begins at the junction of the siphon in the supra clinoid ICA an extends into the M1 segment. Aneurysm projecting posteriorly from the site of origin of the anterior cerebral artery shows flow, measuring maximally 6.9 mm in diameter. More distal branch vessels are patent. Posterior circulation: Both vertebral arteries are patent to the basilar with the left being dominant. No basilar stenosis. Posterior circulation branch vessels are normal. Venous sinuses: Patent and normal Anatomic variants: None significant Delayed phase: No abnormal enhancement IMPRESSION: No atherosclerotic disease at the carotid bifurcations of significance. 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 Bilateral pipeline stent placement. Persistent contrast opacification of the right supra clinoid ICA aneurysm with maximal diameter measuring 8.6 mm. Persisting contrast opacification of the left A1 aneurysm with maximal transverse diameter of 6.9 mm. Electronically Signed   By: Nelson Chimes M.D.   On: 03/01/2015 15:04   Mr Virgel Paling Wo Contrast  03/15/2015  CLINICAL DATA:  70 year old female with acute onset altered mental status and speech changes suspicious for acute stroke. Initial encounter. 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 Study performed under anesthesia. EXAM: MRI HEAD WITHOUT CONTRAST MRA HEAD WITHOUT CONTRAST TECHNIQUE: Multiplanar, multiecho pulse sequences of the brain and surrounding structures were obtained without intravenous contrast. Angiographic images of the head were obtained using MRA technique without contrast. COMPARISON:  Head CT without contrast 03/12/2015. CTA head and neck  03/01/2015. Brain MRI and intracranial MRA 05/02/2014 FINDINGS: MRI HEAD FINDINGS Major intracranial vascular flow voids are stable since 2016. Stable cerebral volume. Multiple scattered small round foci of restricted diffusion in the left periatrial white matter and along the lateral aspect of the left occipital horn in the left occipital lobe. See series 4. Chronic underlying T2 and FLAIR hyperintensity in this region. No associated mass effect or acute hemorrhage. No other restricted diffusion identified. Multiple chronic but  new micro hemorrhages in the brain on series 9, including in the left caudothalamic notch, right hippocampal formation, both occipital lobes (not associated with the acute diffusion changes), posterior left temporal lobe and posterior left temporal lobe. Expected evolution of the left corona radiata lacunar infarct seen in 2016 with advanced bilateral chronic white matter small vessel disease. Multiple chronic bilateral deep gray matter lacunar infarcts. Patchy T2 hyperintensity in the pons. The cerebellum is relatively spared. No midline shift, mass effect, evidence of mass lesion, ventriculomegaly, extra-axial collection or acute intracranial hemorrhage. Cervicomedullary junction and pituitary are within normal limits. Negative visualized cervical spine. Normal bone marrow signal. Visible internal auditory structures appear normal. Mastoids are clear. Fluid level in the nasopharynx in the setting of general anesthesia. Regressed paranasal sinus mucosal thickening and opacification. Negative orbit and scalp soft tissues. MRA HEAD FINDINGS Stable antegrade flow in the posterior circulation with dominant distal left vertebral artery, an the right functionally terminates in PICA. Stable basilar artery flow signal without stenosis. Stable SCA and PCA origins, with shared and mildly dolichoectasia attic left PCA/ SCA origin re- demonstrated. Both posterior communicating arteries are visible  today and remain patent. Bilateral PCA branches are within normal limits. Stable antegrade flow in both ICA siphons. 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 Mild susceptibility artifact related to bilateral distal ICA pipeline stents re- demonstrated. That on the left extends from distal to the left ophthalmic origin to the left proximal M1 terminating near the left anterior temporal artery origin. On the right the stent extends from the anterior genu to the proximal right M1. Superiorly directed left ICA terminus and posteriorly and laterally directed right ICA terminus saccular aneurysms re- demonstrated and stable from the CTA earlier this month measuring 7-9 mm each. MCA and ACA origins remain patent. Anterior communicating artery is within normal limits. Mildly to moderately attenuated left ACA distal A2 segment appears stable. Both MCA bifurcations remain patent. Visualized bilateral MCA branches are stable. IMPRESSION: 1. 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 No associated hemorrhage or mass effect. 2. Advanced underlying chronic small vessel disease, with progression since 2016 in the form of scattered chronic micro hemorrhages. 3. No evidence of emergent large vessel occlusion. Bilateral ICA terminus aneurysms and other vascular findings appears stable since the recent 03/01/2015 CTA head and neck (please see that report). Electronically Signed   By: Genevie Ann M.D.   On: 03/15/2015 15:09   Mr Brain Wo Contrast  03/15/2015  CLINICAL DATA:  70 year old female with acute onset altered mental status and speech changes suspicious for acute stroke. Initial encounter. 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 Study performed under anesthesia. EXAM: MRI HEAD WITHOUT CONTRAST MRA HEAD WITHOUT CONTRAST TECHNIQUE: Multiplanar, multiecho  pulse sequences of the brain and surrounding structures were obtained without intravenous contrast. Angiographic images of the head were obtained using MRA technique without contrast. COMPARISON:  Head CT without contrast 03/12/2015. CTA head and neck 03/01/2015. Brain MRI and intracranial MRA 05/02/2014 FINDINGS: MRI HEAD FINDINGS Major intracranial vascular flow voids are stable since 2016. Stable cerebral volume. Multiple scattered small round foci of restricted diffusion in the left periatrial white matter and along the lateral aspect of the left occipital horn in the left occipital lobe. See series 4. Chronic underlying T2 and FLAIR hyperintensity in this region. No associated mass effect or acute hemorrhage. No other  restricted diffusion identified. Multiple chronic but new micro hemorrhages in the brain on series 9, including in the left caudothalamic notch, right hippocampal formation, both occipital lobes (not associated with the acute diffusion changes), posterior left temporal lobe and posterior left temporal lobe. Expected evolution of the left corona radiata lacunar infarct seen in 2016 with advanced bilateral chronic white matter small vessel disease. Multiple chronic bilateral deep gray matter lacunar infarcts. Patchy T2 hyperintensity in the pons. The cerebellum is relatively spared. No midline shift, mass effect, evidence of mass lesion, ventriculomegaly, extra-axial collection or acute intracranial hemorrhage. Cervicomedullary junction and pituitary are within normal limits. Negative visualized cervical spine. Normal bone marrow signal. Visible internal auditory structures appear normal. Mastoids are clear. Fluid level in the nasopharynx in the setting of general anesthesia. Regressed paranasal sinus mucosal thickening and opacification. Negative orbit and scalp soft tissues. MRA HEAD FINDINGS Stable antegrade flow in the posterior circulation with dominant distal left vertebral artery, an the  right functionally terminates in PICA. Stable basilar artery flow signal without stenosis. Stable SCA and PCA origins, with shared and mildly dolichoectasia attic left PCA/ SCA origin re- demonstrated. Both posterior communicating arteries are visible today and remain patent. Bilateral PCA branches are within normal limits. Stable antegrade flow in both ICA siphons. 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 Mild susceptibility artifact related to bilateral distal ICA pipeline stents re- demonstrated. That on the left extends from distal to the left ophthalmic origin to the left proximal M1 terminating near the left anterior temporal artery origin. On the right the stent extends from the anterior genu to the proximal right M1. Superiorly directed left ICA terminus and posteriorly and laterally directed right ICA terminus saccular aneurysms re- demonstrated and stable from the CTA earlier this month measuring 7-9 mm each. MCA and ACA origins remain patent. Anterior communicating artery is within normal limits. Mildly to moderately attenuated left ACA distal A2 segment appears stable. Both MCA bifurcations remain patent. Visualized bilateral MCA branches are stable. IMPRESSION: 1. 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 No associated hemorrhage or mass effect. 2. Advanced underlying chronic small vessel disease, with progression since 2016 in the form of scattered chronic micro hemorrhages. 3. No evidence of emergent large vessel occlusion. Bilateral ICA terminus aneurysms and other vascular findings appears stable since the recent 03/01/2015 CTA head and neck (please see that report). Electronically Signed   By: Genevie Ann M.D.   On: 03/15/2015 15:09   Ir Radiologist Eval & Mgmt  03/10/2015  EXAM: ESTABLISHED PATIENT OFFICE VISIT CHIEF COMPLAINT: Six-month follow-up following treatment of left internal carotid artery terminal aneurysm.  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 The patient is accompanied by her daughter and granddaughter. Clinically the patient reports no significant headaches, nausea, vomiting, visual symptoms, loss of consciousness, seizures or incoordination. 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 Otherwise the patient is at home and able to cope independently. She does complain of increased bruisability especially over her torso. The patient however does report being depressed but without being weepy. This is corroborated by her daughter and granddaughter. Her appetite is normal. She has gained about 10 pounds since the last procedure.  Otherwise she reports no recent chills, fever or rigors. On close questioning, the patient does report spells of bleeding from her nostrils and also her mouth at least once. She denies any hematuria or black tarry stools. Her review of systems is essentially negative for pathologic symptomatology. Her present medications include albuterol inhaler, aspirin 81 mg, atorvastatin, Plavix, lisinopril hydrochlorothiazide combination, Toprol. Her allergies include Dilaudid, codeine, hydromorphone, latex, sulfa drugs. PHYSICAL EXAMINATION: Affect appropriate. In no acute distress. However does drag her left foot on walking with a cane. This apparently has remained stable since her most recent stroke. ASSESSMENT AND PLAN: The patient is awaiting the results of her CTA scan of her head and neck which she had just a few minutes ago. The results are not available at this time. She did develop extravasation at the site of the needle stick. This has been evaluated and treated with ice pack. Patient will undergo a P2Y12 study  today. A follow-up MRI/MRA of the brain with sedation will be scheduled for 6 months time. The results of the CT angiogram done today will hopefully be available for review and reported to the patient. She has been asked to continue with active physical therapy as tolerated. She has scheduled an appointment with outpatient physical therapy. Questions were answered to her satisfaction. They were asked to call should they have any concerns or questions. Electronically Signed   By: [name] M.D.   On: 03/01/2015 15:01         Subjective: Patient denies fevers, chills, headache, chest pain, dyspnea, nausea, vomiting, diarrhea, abdominal pain, dysuria, hematuria   Objective: Filed Vitals:   03/16/15 0505 03/16/15 1039 03/16/15 1420 03/16/15 1830  BP: 122/66 134/68 128/70 138/72  Pulse: 67 50 68 67  Temp: 98.4 F (36.9 C) 98.4 F (36.9 C) 98 F (36.7 C) 98.7 F (37.1 C)  TempSrc: Oral Oral Oral Oral  Resp: 18 18 18 18   Height:      Weight:      SpO2: 100% 98% 98% 98%   No intake or output data in the 24 hours ending 03/16/15 2204 Weight change:  Exam:   General:  Pt is alert, follows commands appropriately, not in acute distress  HEENT: No icterus, No thrush, No neck mass, /AT  Cardiovascular: RRR, S1/S2, no rubs, no gallops  Respiratory: CTA bilaterally, no wheezing, no crackles, no rhonchi  Abdomen: Soft/+BS, non tender, non distended, no guarding  Extremities: 1 +LE edema, No lymphangitis, No petechiae, No rashes, no synovitis  Data Reviewed: Basic Metabolic Panel:  Recent Labs Lab 03/12/15 1337 03/12/15 1349 03/14/15 0413 03/15/15 0537 03/15/15 1603  NA 138 141 142 141  --   K 3.8 3.8 3.3* 3.7  --   CL 103 99* 105 107  --   CO2 25  --  29 26  --   GLUCOSE 102* 98 94 99  --   BUN 8 9 12 8   --   CREATININE 0.85 0.80 0.79 0.77 0.82  CALCIUM 9.0  --  8.5* 8.4*  --   MG  --   --   --  2.0  --    Liver Function Tests:  Recent Labs Lab  03/12/15 1337 03/14/15 0413  AST 20 16  ALT 13* 12*  ALKPHOS 42 35*  BILITOT 0.9 0.8  PROT 6.9 5.9*  ALBUMIN 3.5 3.1*   No results for input(s): LIPASE, AMYLASE in the last 168 hours. No results for input(s): AMMONIA in the last 168 hours.  CBC:  Recent Labs Lab 03/12/15 1337 03/12/15 1349 03/15/15 0537  WBC 4.0  --  3.3*  NEUTROABS 2.6  --   --   HGB 12.1 12.2 11.1*  HCT 35.7* 36.0 32.3*  MCV 85.8  --  85.0  PLT 125*  --  107*   Cardiac Enzymes: No results for input(s): CKTOTAL, CKMB, CKMBINDEX, TROPONINI in the last 168 hours. BNP: Invalid input(s): POCBNP CBG: No results for input(s): GLUCAP in the last 168 hours.  No results found for this or any previous visit (from the past 240 hour(s)).   Scheduled Meds: . aspirin  81 mg Oral Daily  . atorvastatin  40 mg Oral q1800  . clopidogrel  37.5 mg Oral Daily  . enoxaparin (LOVENOX) injection  40 mg Subcutaneous Q24H  . metoprolol succinate  25 mg Oral BID   Continuous Infusions:    Amalio Loe, DO  [facility] Pager [phone]  If 7PM-7AM, please contact night-coverage [email or web address] Password [email or web address] 03/16/2015, 10:04 PM   LOS: 1 day

## 2015-03-17 ENCOUNTER — Inpatient Hospital Stay (HOSPITAL_COMMUNITY): Payer: Medicare Other

## 2015-03-17 DIAGNOSIS — I639 Cerebral infarction, unspecified: Secondary | ICD-10-CM

## 2015-03-17 DIAGNOSIS — R4182 Altered mental status, unspecified: Secondary | ICD-10-CM | POA: Insufficient documentation

## 2015-03-17 LAB — CBC
HCT: 32.3 % — ABNORMAL LOW (ref 36.0–46.0)
Hemoglobin: 10.8 g/dL — ABNORMAL LOW (ref 12.0–15.0)
MCH: 29 pg (ref 26.0–34.0)
MCHC: 33.4 g/dL (ref 30.0–36.0)
MCV: 86.8 fL (ref 78.0–100.0)
PLATELETS: 102 10*3/uL — AB (ref 150–400)
RBC: 3.72 MIL/uL — AB (ref 3.87–5.11)
RDW: 14.6 % (ref 11.5–15.5)
WBC: 4.2 10*3/uL (ref 4.0–10.5)

## 2015-03-17 MED ORDER — CLOPIDOGREL BISULFATE 75 MG PO TABS
37.5000 mg | ORAL_TABLET | Freq: Every day | ORAL | Status: DC
Start: 2015-03-17 — End: 2015-10-29

## 2015-03-17 NOTE — Discharge Summary (Signed)
Physician Discharge Summary  Jeanne Lawson  [id number]  DOB: [birth year]  DOA: 03/12/2015  PCP: [name], MD  Admit date: 03/12/2015 Discharge date: 03/17/2015  Time spent: Greater than 30 minutes  Recommendations for Outpatient Follow-up:  1. Dr. [name], PCP in one week with repeat labs (CBC). 2. Dr. [name], IR: Plavix dose per Dr. [name]. 3. Dr. Antony Contras, Neurology in 2 months: Ambulatory neurology referral has been sent.  Discharge Diagnoses:  Active Problems:   TIA (transient ischemic attack)   Hypertension   Hypokalemia   Dysphasia   Thrombocytopenia (HCC)   Acute CVA (cerebrovascular accident) ([place])   Altered mental status   Discharge Condition: Improved & Stable  Diet recommendation: Heart healthy diet.  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 At that time, the patient also felt that she had some left arm weakness which she states she has residual weakness from previous stroke. She denied visual changes, syncope, or other focal deficits. She stated that the episode lasted over an hour but has resolved upon arrival to the emergency department. CT of brain was negative. Neurology was consulted.  Hospital Course:   Acute nonhemorrhagic stroke - Neurology was consulted and patient underwent complete stroke evaluation. - Scattered left PCA infarct small and subcortical likely secondary to small vessel disease. Likely clinically silent and asymptomatic. Dizziness likely multifactorial and unrelated. 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  Progressed small vessel disease. - MRA brain: No large vessel occlusion. - Carotid Dopplers: Bilateral 1-39 percent ICA stenosis. Vertebral artery flow is antegrade. - 2-D echo: LVEF 60-65% and grade 1 diastolic dysfunction. - Lower extremity venous Dopplers: Negative for DVT. - LDL: 63 - Hemoglobin A1c: 5.5  - Patient was on aspirin 81 MG daily and clopidogrel 75 MG daily prior to admission, continue same for secondary stroke prophylaxis.  - PT and OT do not have home therapy recommendations - EEG: Few intermittent left temporal sharp but no seizure activity. Patient had been empirically started on Keppra but no clear symptomatology for seizures hence neurology discontinued Keppra.   Hypertension  - Continue metoprolol succinate 25 mg bid - Resume lisinopril/HCTZ at discharge  Hyperlipidemia  - LDL 63, goal <70. - Continue statin at discharge.   History of carotid/intracranial aneurysm status post coil embolization  -Patient has been instructed to continue aspirin and Plavix 37.5 mg daily . Discussed with Dr. [name]: Patient on reduced dose of Plavix by Dr. [name] secondary to easy bruising. As per Dr. [name], continue current aspirin 81 MG daily and Plavix 37.5 MG daily and defer dose adjustment per Dr. [name].  Coronary artery disease  -EKG without any concerning ischemic changes  -No angina  -Continue antiplatelet therapy, metoprolol succinate, and Lipitor   History of tobacco abuse/COPD -Patient quit approximately 1 year ago after 50 pack years  -Presently stable on room air   Thrombocytopenia -chronic. Stable. -monitor for signs of bleeding  Leg pain and edema -duplex r/o DVT--neg  Hypokalemia -Repleted - magnesium--2.0  Anemia - Stable. Outpatient follow-up.   Consultations:  Neurology  Procedures:  2-D echo 03/13/15: Study Conclusions  - Left ventricle: The cavity size was normal. Wall  thickness was increased in a pattern of moderate LVH.  Systolic function was normal. The estimated ejection fraction was in the range of 60% to 65%. Wall motion was normal; there were no regional wall motion abnormalities. Doppler parameters are consistent with abnormal left ventricular relaxation (grade 1 diastolic dysfunction).  Carotid Dopplers 03/17/15: Summary: Bilateral - 1% to 39% ICA stenosis. Vetebral artery flow is Antegrade.  Bilateral lower extremity venous Dopplers 03/14/15: Summary:  - No evidence of deep vein thrombosis involving the right lower  extremity, left lower extremity, and left common femoral vein. - No evidence of Baker&'s cyst on the right or left.   Discharge Exam:  Complaints:  Denies new complaints. Chronic left-sided weakness-at baseline.  Filed Vitals:   03/16/15 2203 03/17/15 0117 03/17/15 0507 03/17/15 1017  BP: 153/70 131/66 134/60 160/86  Pulse: 61 61 62 62  Temp: 98.1 F (36.7 C) 98.1 F (36.7 C) 98.4 F (36.9 C) 97.5 F (36.4 C)  TempSrc: Oral Oral Oral Oral  Resp: 18 18 16 16   Height:      Weight:      SpO2: 97% 96% 96% 96%    General exam: Pleasant elderly female sitting comfortably at edge of bed. Respiratory system: Clear. No increased work of breathing. Cardiovascular system: S1 & S2 heard, RRR. No JVD, murmurs, gallops, clicks or pedal edema.  Gastrointestinal system: Abdomen is nondistended, soft and nontender. Normal bowel sounds heard. Central nervous system: Alert and oriented. No focal neurological deficits. Extremities: Symmetric 5 x 5 power except left upper extremity where grade 4+ x 5 power..  Discharge Instructions      Discharge Instructions    Ambulatory referral to Neurology    Complete by:  As directed   An appointment is requested in approximately: 8 weeks     Call MD for:  extreme fatigue    Complete by:  As directed      Call MD for:  persistant dizziness or light-headedness    Complete by:  As directed      Call MD for:    Complete by:  As  directed   Strokelike symptoms.     Diet - low sodium heart healthy    Complete by:  As directed      Increase activity slowly    Complete by:  As directed             Medication List    TAKE these medications        albuterol 108 (90 Base) MCG/ACT inhaler  Commonly known as:  PROVENTIL HFA;VENTOLIN HFA  Inhale 1-2 puffs into the lungs every 6 (six) hours as needed for wheezing or shortness of breath.     aspirin 81 MG tablet  Take 81 mg by mouth daily.     atorvastatin 40 MG tablet  Commonly known as:  LIPITOR  Take 1 tablet (40 mg total) by mouth daily at 6 PM.     clopidogrel 75 MG tablet  Commonly known as:  PLAVIX  Take 0.5 tablets (37.5 mg total) by mouth daily.     lisinopril-hydrochlorothiazide 20-12.5 MG tablet  Commonly known as:  PRINZIDE,ZESTORETIC  TAKE 1 TABLET BY MOUTH DAILY.     TOPROL XL 50 MG 24 hr tablet  Generic drug:  metoprolol succinate  TAKE 1/2 TABLET BY MOUTH TWICE DAILY       Follow-up Information    Follow up with [name], MD. Schedule an appointment as soon as possible for a visit in  1 week.   Specialty:  Internal Medicine   Why:  To be seen with repeat labs (CBC).   Contact information:   [address] [place] [address] [phone]       Schedule an appointment as soon as possible for a visit with DEVESHWAR, Fritz Pickerel, MD.   Specialty:  Interventional Radiology   Contact information:   [address] [place] [address] [phone]       Follow up with SETHI,PRAMOD, MD. Schedule an appointment as soon as possible for a visit in 2 months.   Specialties:  Neurology, Radiology   Contact information:   [address] [place] [address] [phone]       Get [place] reviewed and adjusted: Please take all your medications with you for your next visit with your Primary MD  Please request your Primary MD to go over all hospital tests and procedure/radiological results at the  follow up. Please ask your Primary MD to get all Hospital records sent to his/her office.  If you experience worsening of your admission symptoms, develop shortness of breath, life threatening emergency, suicidal or homicidal thoughts you must seek medical attention immediately by calling 911 or calling your MD immediately if symptoms less severe.  You must read complete instructions/literature along with all the possible adverse reactions/side effects for all the Medicines you take and that have been prescribed to you. Take any new Medicines after you have completely understood and accept all the possible adverse reactions/side effects.   Do not drive when taking pain medications.   Do not take more than prescribed Pain, Sleep and Anxiety Medications  Special Instructions: If you have smoked or chewed Tobacco in the last 2 yrs please stop smoking, stop any regular Alcohol and or any Recreational drug use.  Wear Seat belts while driving.  Please note  You were cared for by a hospitalist during your hospital stay. Once you are discharged, your primary care physician will handle any further medical issues. Please note that NO REFILLS for any discharge medications will be authorized once you are discharged, as it is imperative that you return to your primary care physician (or establish a relationship with a primary care physician if you do not have one) for your aftercare needs so that they can reassess your need for medications and monitor your lab values.    The results of significant diagnostics from this hospitalization (including imaging, microbiology, ancillary and laboratory) are listed below for reference.    Significant Diagnostic Studies: Ct Angio Head W/cm &/or Wo Cm  03/01/2015  CLINICAL DATA:  Three-month follow-up of pipeline stent for treatment of aneurysm. EXAM: CT ANGIOGRAPHY HEAD AND NECK TECHNIQUE: Multidetector CT imaging of the head and neck was performed using the  standard protocol during bolus administration of intravenous contrast. Multiplanar CT image reconstructions and MIPs were obtained to evaluate the vascular anatomy. Carotid stenosis measurements (when applicable) are obtained utilizing NASCET criteria, using the distal internal carotid diameter as the denominator. CONTRAST:  38mL OMNIPAQUE IOHEXOL 350 MG/ML SOLN COMPARISON:  Angiography 11/18/2014 FINDINGS: CT HEAD The brainstem and cerebellum are normal. Pipeline stent devices are present in the anterior circulation at the skullbase bilaterally. Extensive chronic small vessel ischemic changes are again seen throughout the cerebral hemispheric white matter. No large vessel territory no hydrocephalus or extra-axial fluid. The calvarium is unremarkable. Infarction. CTA NECK Aortic arch: Atherosclerosis of the aorta but no aneurysm or dissection. Branching pattern of the brachiocephalic vessels  from the arch is normal with a common origin of the innominate artery and left common carotid artery. Right carotid system: Common carotid artery widely patent to the bifurcation region. Mild atherosclerosis of the carotid bifurcation but no stenosis of the ICA lumen beyond that of the more distal normal cervical ICA and therefore no stenosis. Fusiform aneurysmal dilatation of the ICA proximal to the skullbase with maximal diameter of 8.5 mm. Similar the previous angiogram images. Left carotid system: Common carotid artery widely patent to the bifurcation region. Mild atherosclerotic disease at the carotid bifurcation and proximal ICA bulb but no stenosis. More distal cervical ICA is within normal limits. Vertebral arteries:Both vertebral artery origins are widely patent. Both vertebral arteries widely patent through the cervical region without evidence of focal stenosis or dissection. Skeleton: Mild spondylosis Other neck: No soft tissue neck lesion.  Upper lungs are clear. CTA HEAD Anterior circulation: Right pipeline stent  beginning at the apex of the siphon and extending into the right M1 segment. Aneurysm of the supra clinoid ICA projecting posteriorly shows flow, with maximal diameter of 8.6 mm. Anterior and middle cerebral arteries in the branch vessels appear otherwise normal. Pipeline stent on the left begins at the junction of the siphon in the supra clinoid ICA an extends into the M1 segment. Aneurysm projecting posteriorly from the site of origin of the anterior cerebral artery shows flow, measuring maximally 6.9 mm in diameter. More distal branch vessels are patent. Posterior circulation: Both vertebral arteries are patent to the basilar with the left being dominant. No basilar stenosis. Posterior circulation branch vessels are normal. Venous sinuses: Patent and normal Anatomic variants: None significant Delayed phase: No abnormal enhancement IMPRESSION: No atherosclerotic disease at the carotid bifurcations of significance. 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 Bilateral pipeline stent placement. Persistent contrast opacification of the right supra clinoid ICA aneurysm with maximal diameter measuring 8.6 mm. Persisting contrast opacification of the left A1 aneurysm with maximal transverse diameter of 6.9 mm. Electronically Signed   By: Nelson Chimes M.D.   On: 03/01/2015 15:04   Dg Chest 2 View  03/12/2015  CLINICAL DATA:  TIA/possible stroke. Three brain aneurysms 3 prior surgeries. EXAM: CHEST  2 VIEW COMPARISON:  11/19/2014 and 11/22/2014 FINDINGS: Lungs are adequately inflated without focal consolidation or effusion. There is mild stable cardiomegaly. There is minimal calcified plaque over the thoracic aorta. There are mild degenerative changes of the spine. IMPRESSION: No active cardiopulmonary disease. Mild stable cardiomegaly. Electronically Signed   By: Marin Olp M.D.   On: 03/12/2015 21:25   Ct Head Wo Contrast  03/12/2015   CLINICAL DATA:  Change in coordination.  History of stroke EXAM: CT HEAD WITHOUT CONTRAST TECHNIQUE: Contiguous axial images were obtained from the base of the skull through the vertex without intravenous contrast. COMPARISON:  03/01/2015 FINDINGS: Skull and Sinuses:Negative for fracture or destructive process. The visualized mastoids, middle ears, and imaged paranasal sinuses are clear. Visualized orbits: Negative. Brain: No evidence of acute infarction, hemorrhage, hydrocephalus, or mass lesion/mass effect. Left A1 and right supraclinoid ICA aneurysms which have encountered with stents. There has been recent CTA evaluation which shows continued opacification of these aneurysm sacs. There is no evidence of subarachnoid hemorrhage or brain edema. Advanced chronic small vessel disease with confluent ischemic gliosis throughout the deep cerebral white matter and lacunar infarcts in the bilateral corona radiata and right centrum semiovale. Generalized atrophy. IMPRESSION: 1. No acute finding or  change from prior. 2. Advanced chronic small vessel disease. 3. Treated intracranial aneurysms, reference CTA 03/01/2015. Electronically Signed   By: Monte Fantasia M.D.   On: 03/12/2015 15:46   Ct Angio Neck W/cm &/or Wo/cm  03/01/2015  CLINICAL DATA:  Three-month follow-up of pipeline stent for treatment of aneurysm. EXAM: CT ANGIOGRAPHY HEAD AND NECK TECHNIQUE: Multidetector CT imaging of the head and neck was performed using the standard protocol during bolus administration of intravenous contrast. Multiplanar CT image reconstructions and MIPs were obtained to evaluate the vascular anatomy. Carotid stenosis measurements (when applicable) are obtained utilizing NASCET criteria, using the distal internal carotid diameter as the denominator. CONTRAST:  63mL OMNIPAQUE IOHEXOL 350 MG/ML SOLN COMPARISON:  Angiography 11/18/2014 FINDINGS: CT HEAD The brainstem and cerebellum are normal. Pipeline stent devices are present in the  anterior circulation at the skullbase bilaterally. Extensive chronic small vessel ischemic changes are again seen throughout the cerebral hemispheric white matter. No large vessel territory no hydrocephalus or extra-axial fluid. The calvarium is unremarkable. Infarction. CTA NECK Aortic arch: Atherosclerosis of the aorta but no aneurysm or dissection. Branching pattern of the brachiocephalic vessels from the arch is normal with a common origin of the innominate artery and left common carotid artery. Right carotid system: Common carotid artery widely patent to the bifurcation region. Mild atherosclerosis of the carotid bifurcation but no stenosis of the ICA lumen beyond that of the more distal normal cervical ICA and therefore no stenosis. Fusiform aneurysmal dilatation of the ICA proximal to the skullbase with maximal diameter of 8.5 mm. Similar the previous angiogram images. Left carotid system: Common carotid artery widely patent to the bifurcation region. Mild atherosclerotic disease at the carotid bifurcation and proximal ICA bulb but no stenosis. More distal cervical ICA is within normal limits. Vertebral arteries:Both vertebral artery origins are widely patent. Both vertebral arteries widely patent through the cervical region without evidence of focal stenosis or dissection. Skeleton: Mild spondylosis Other neck: No soft tissue neck lesion.  Upper lungs are clear. CTA HEAD Anterior circulation: Right pipeline stent beginning at the apex of the siphon and extending into the right M1 segment. Aneurysm of the supra clinoid ICA projecting posteriorly shows flow, with maximal diameter of 8.6 mm. Anterior and middle cerebral arteries in the branch vessels appear otherwise normal. Pipeline stent on the left begins at the junction of the siphon in the supra clinoid ICA an extends into the M1 segment. Aneurysm projecting posteriorly from the site of origin of the anterior cerebral artery shows flow, measuring maximally  6.9 mm in diameter. More distal branch vessels are patent. Posterior circulation: Both vertebral arteries are patent to the basilar with the left being dominant. No basilar stenosis. Posterior circulation branch vessels are normal. Venous sinuses: Patent and normal Anatomic variants: None significant Delayed phase: No abnormal enhancement IMPRESSION: No atherosclerotic disease at the carotid bifurcations of significance. 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 Bilateral pipeline stent placement. Persistent contrast opacification of the right supra clinoid ICA aneurysm with maximal diameter measuring 8.6 mm. Persisting contrast opacification of the left A1 aneurysm with maximal transverse diameter of 6.9 mm. Electronically Signed   By: Nelson Chimes M.D.   On: 03/01/2015 15:04   Mr Virgel Paling Wo Contrast  03/15/2015  CLINICAL DATA:  70 year old female with acute onset altered mental status and speech changes suspicious for acute stroke. Initial encounter. Personal history of endovascular treated right supraclinoid ICA and left ACA  origin aneurysms, using bilateral carotid termini pipeline stents. Study performed under anesthesia. EXAM: MRI HEAD WITHOUT CONTRAST MRA HEAD WITHOUT CONTRAST TECHNIQUE: Multiplanar, multiecho pulse sequences of the brain and surrounding structures were obtained without intravenous contrast. Angiographic images of the head were obtained using MRA technique without contrast. COMPARISON:  Head CT without contrast 03/12/2015. CTA head and neck 03/01/2015. Brain MRI and intracranial MRA 05/02/2014 FINDINGS: MRI HEAD FINDINGS Major intracranial vascular flow voids are stable since 2016. Stable cerebral volume. Multiple scattered small round foci of restricted diffusion in the left periatrial white matter and along the lateral aspect of the left occipital horn in the left occipital lobe. See series 4. Chronic underlying T2  and FLAIR hyperintensity in this region. No associated mass effect or acute hemorrhage. No other restricted diffusion identified. Multiple chronic but new micro hemorrhages in the brain on series 9, including in the left caudothalamic notch, right hippocampal formation, both occipital lobes (not associated with the acute diffusion changes), posterior left temporal lobe and posterior left temporal lobe. Expected evolution of the left corona radiata lacunar infarct seen in 2016 with advanced bilateral chronic white matter small vessel disease. Multiple chronic bilateral deep gray matter lacunar infarcts. Patchy T2 hyperintensity in the pons. The cerebellum is relatively spared. No midline shift, mass effect, evidence of mass lesion, ventriculomegaly, extra-axial collection or acute intracranial hemorrhage. Cervicomedullary junction and pituitary are within normal limits. Negative visualized cervical spine. Normal bone marrow signal. Visible internal auditory structures appear normal. Mastoids are clear. Fluid level in the nasopharynx in the setting of general anesthesia. Regressed paranasal sinus mucosal thickening and opacification. Negative orbit and scalp soft tissues. MRA HEAD FINDINGS Stable antegrade flow in the posterior circulation with dominant distal left vertebral artery, an the right functionally terminates in PICA. Stable basilar artery flow signal without stenosis. Stable SCA and PCA origins, with shared and mildly dolichoectasia attic left PCA/ SCA origin re- demonstrated. Both posterior communicating arteries are visible today and remain patent. Bilateral PCA branches are within normal limits. Stable antegrade flow in both ICA siphons. 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 Mild susceptibility artifact related to bilateral distal ICA pipeline stents re- demonstrated. That on the left extends from distal to the left ophthalmic origin to the left  proximal M1 terminating near the left anterior temporal artery origin. On the right the stent extends from the anterior genu to the proximal right M1. Superiorly directed left ICA terminus and posteriorly and laterally directed right ICA terminus saccular aneurysms re- demonstrated and stable from the CTA earlier this month measuring 7-9 mm each. MCA and ACA origins remain patent. Anterior communicating artery is within normal limits. Mildly to moderately attenuated left ACA distal A2 segment appears stable. Both MCA bifurcations remain patent. Visualized bilateral MCA branches are stable. IMPRESSION: 1. 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 No associated hemorrhage or mass effect. 2. Advanced underlying chronic small vessel disease, with progression since 2016 in the form of scattered chronic micro hemorrhages. 3. No evidence of emergent large vessel occlusion. Bilateral ICA terminus aneurysms and other vascular findings appears stable since the recent 03/01/2015 CTA head and neck (please see that report). Electronically Signed   By: Genevie Ann M.D.   On: 03/15/2015 15:09   Mr Brain Wo Contrast  03/15/2015  CLINICAL DATA:  70 year old female with acute onset altered mental status and speech changes suspicious for acute stroke. Initial encounter. Personal history of endovascular treated  right supraclinoid ICA and left ACA origin aneurysms, using bilateral carotid termini pipeline stents. Study performed under anesthesia. EXAM: MRI HEAD WITHOUT CONTRAST MRA HEAD WITHOUT CONTRAST TECHNIQUE: Multiplanar, multiecho pulse sequences of the brain and surrounding structures were obtained without intravenous contrast. Angiographic images of the head were obtained using MRA technique without contrast. COMPARISON:  Head CT without contrast 03/12/2015. CTA head and neck 03/01/2015. Brain MRI and intracranial MRA 05/02/2014 FINDINGS: MRI HEAD FINDINGS Major intracranial vascular flow voids are stable  since 2016. Stable cerebral volume. Multiple scattered small round foci of restricted diffusion in the left periatrial white matter and along the lateral aspect of the left occipital horn in the left occipital lobe. See series 4. Chronic underlying T2 and FLAIR hyperintensity in this region. No associated mass effect or acute hemorrhage. No other restricted diffusion identified. Multiple chronic but new micro hemorrhages in the brain on series 9, including in the left caudothalamic notch, right hippocampal formation, both occipital lobes (not associated with the acute diffusion changes), posterior left temporal lobe and posterior left temporal lobe. Expected evolution of the left corona radiata lacunar infarct seen in 2016 with advanced bilateral chronic white matter small vessel disease. Multiple chronic bilateral deep gray matter lacunar infarcts. Patchy T2 hyperintensity in the pons. The cerebellum is relatively spared. No midline shift, mass effect, evidence of mass lesion, ventriculomegaly, extra-axial collection or acute intracranial hemorrhage. Cervicomedullary junction and pituitary are within normal limits. Negative visualized cervical spine. Normal bone marrow signal. Visible internal auditory structures appear normal. Mastoids are clear. Fluid level in the nasopharynx in the setting of general anesthesia. Regressed paranasal sinus mucosal thickening and opacification. Negative orbit and scalp soft tissues. MRA HEAD FINDINGS Stable antegrade flow in the posterior circulation with dominant distal left vertebral artery, an the right functionally terminates in PICA. Stable basilar artery flow signal without stenosis. Stable SCA and PCA origins, with shared and mildly dolichoectasia attic left PCA/ SCA origin re- demonstrated. Both posterior communicating arteries are visible today and remain patent. Bilateral PCA branches are within normal limits. Stable antegrade flow in both ICA siphons. 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 Mild susceptibility artifact related to bilateral distal ICA pipeline stents re- demonstrated. That on the left extends from distal to the left ophthalmic origin to the left proximal M1 terminating near the left anterior temporal artery origin. On the right the stent extends from the anterior genu to the proximal right M1. Superiorly directed left ICA terminus and posteriorly and laterally directed right ICA terminus saccular aneurysms re- demonstrated and stable from the CTA earlier this month measuring 7-9 mm each. MCA and ACA origins remain patent. Anterior communicating artery is within normal limits. Mildly to moderately attenuated left ACA distal A2 segment appears stable. Both MCA bifurcations remain patent. Visualized bilateral MCA branches are stable. IMPRESSION: 1. 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 No associated hemorrhage or mass effect. 2. Advanced underlying chronic small vessel disease, with progression since 2016 in the form of scattered chronic micro hemorrhages. 3. No evidence of emergent large vessel occlusion. Bilateral ICA terminus aneurysms and other vascular findings appears stable since the recent 03/01/2015 CTA head and neck (please see that report). Electronically Signed   By: Genevie Ann M.D.   On: 03/15/2015 15:09   [facility] Radiologist Eval & Mgmt  03/10/2015  EXAM: ESTABLISHED PATIENT OFFICE VISIT CHIEF COMPLAINT: Six-month follow-up following treatment of left internal carotid artery terminal aneurysm.  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 The patient is accompanied by her daughter and granddaughter. Clinically the patient reports no significant headaches,  nausea, vomiting, visual symptoms, loss of consciousness, seizures or incoordination. 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 Otherwise the patient is at home and able to cope independently. She does complain of increased bruisability especially over her torso. The patient however does report being depressed but without being weepy. This is corroborated by her daughter and granddaughter. Her appetite is normal. She has gained about 10 pounds since the last procedure. Otherwise she reports no recent chills, fever or rigors. On close questioning, the patient does report spells of bleeding from her nostrils and also her mouth at least once. She denies any hematuria or black tarry stools. Her review of systems is essentially negative for pathologic symptomatology. Her present medications include albuterol inhaler, aspirin 81 mg, atorvastatin, Plavix, lisinopril hydrochlorothiazide combination, Toprol. Her allergies include Dilaudid, codeine, hydromorphone, latex, sulfa drugs. PHYSICAL EXAMINATION: Affect appropriate. In no acute distress. However does drag her left foot on walking with a cane. This apparently has remained stable since her most recent stroke. ASSESSMENT AND PLAN: The patient is awaiting the results of her CTA scan of her head and neck which she had just a few minutes ago. The results are not available at this time. She did develop extravasation at the site of the needle stick. This has been evaluated and treated with ice pack. Patient will undergo a P2Y12 study today. A follow-up MRI/MRA of the brain with sedation will be scheduled for 6 months time. The results of the CT angiogram done today will hopefully be available for review and reported to the patient. She has been asked to continue with active physical therapy as tolerated. She has scheduled an appointment with outpatient physical therapy. Questions were answered to her satisfaction.  They were asked to call should they have any concerns or questions. Electronically Signed   By: [name] M.D.   On: 03/01/2015 15:01    Microbiology: No results found for this or any previous visit (from the past 240 hour(s)).   Labs: Basic Metabolic Panel:  Recent Labs Lab 03/12/15 1337 03/12/15 1349 03/14/15 0413 03/15/15 0537 03/15/15 1603  NA 138 141 142 141  --   K 3.8 3.8 3.3* 3.7  --   CL 103 99* 105 107  --   CO2 25  --  29 26  --   GLUCOSE 102* 98 94 99  --   BUN 8 9 12 8   --   CREATININE 0.85 0.80 0.79 0.77 0.82  CALCIUM 9.0  --  8.5* 8.4*  --   MG  --   --   --  2.0  --    Liver Function Tests:  Recent Labs Lab 03/12/15 1337 03/14/15 0413  AST 20 16  ALT 13* 12*  ALKPHOS 42 35*  BILITOT 0.9 0.8  PROT 6.9 5.9*  ALBUMIN 3.5 3.1*   No results for input(s): LIPASE, AMYLASE in the last 168 hours. No results for input(s): AMMONIA in the last 168 hours. CBC:  Recent Labs Lab 03/12/15 1337 03/12/15 1349 03/15/15 0537 03/17/15 0553  WBC 4.0  --  3.3* 4.2  NEUTROABS 2.6  --   --   --  HGB 12.1 12.2 11.1* 10.8*  HCT 35.7* 36.0 32.3* 32.3*  MCV 85.8  --  85.0 86.8  PLT 125*  --  107* 102*   Cardiac Enzymes: No results for input(s): CKTOTAL, CKMB, CKMBINDEX, TROPONINI in the last 168 hours. BNP: BNP (last 3 results) No results for input(s): BNP in the last 8760 hours.  ProBNP (last 3 results) No results for input(s): PROBNP in the last 8760 hours.  CBG: No results for input(s): GLUCAP in the last 168 hours.     Signed:  Vernell Leep, MD, FACP, FHM. [facility] Pager [phone]  If 7PM-7AM, please contact night-coverage [email or web address] Password [facility] 03/17/2015, 1:09 PM

## 2015-03-17 NOTE — Evaluation (Signed)
Physical Therapy Evaluation & Discharge Patient Details Name: Jeanne Lawson MRN: [id number] DOB: Jun 28, 1945 Today's Date: 03/17/2015   History of Present Illness  Jeanne Lawson is an 70 y.o. female with multiple stroke risk factors including saccular aneurysm and previous stroke who was at home 03/12/2015 when at 46 "she felt fuzzy headed and not right. Daughter noted she was having a hard time getting her thoughts out". This lasted until 12. She was brought to the hospital for fear of her having a stroke. Currently symptoms have fully cleared. Patient was not administered TPA .  Scattered left PCA infarcts small and subcortical likely secondary to small vessel disease.  Clinical Impression  Patient presents close to her functional baseline.  She seems capable to returning home alone, but will have temporary assist from [place].  Feel she compensates adequately for deficits from previous stroke, but will benefit from return to outpatient PT when she feels able to get back out.  States she has talked with therapist there and feel she can self refer when ready.  Encouraged continuing current HEP for LE strength and balance and pt able to return demonstrate some of them.  Also cautioned on fall prevention measures she is able to verbalize safely.  No further acute skilled PT needs.    Follow Up Recommendations No PT follow up    Equipment Recommendations  None recommended by PT    Recommendations for Other Services       Precautions / Restrictions Precautions Precautions: Fall Restrictions Weight Bearing Restrictions: No      Mobility  Bed Mobility Overal bed mobility: Modified Independent                Transfers Overall transfer level: Modified independent Equipment used: Straight cane             General transfer comment: rises easily from chair and bed no  armrests  Ambulation/Gait Ambulation/Gait assistance: Modified independent (Device/Increase time) Ambulation Distance (Feet): 40 Feet Assistive device: Straight cane Gait Pattern/deviations: Step-through pattern;Decreased dorsiflexion - left;Decreased stride length;Shuffle     General Gait Details: drags L toes on the floor, but ambulates with cane independently  Stairs            Wheelchair Mobility    Modified Rankin (Stroke Patients Only) Modified Rankin (Stroke Patients Only) Pre-Morbid Rankin Score: No significant disability Modified Rankin: Slight disability     Balance           Standing balance support: During functional activity Standing balance-Leahy Scale: Good Standing balance comment: was attending outpatient PT prior to this episode.  planning to continue HEP and return at later time to outpatient PT                             Pertinent Vitals/Pain Pain Assessment: No/denies pain    Home Living Family/patient expects to be discharged to:: Private residence Living Arrangements: Alone Available Help at Discharge: Family;Friend(s);Available 24 hours/day (daughter from the beach coming to stay a few days) Type of Home: House Home Access: Stairs to enter Entrance Stairs-Rails: Right;Left Entrance Stairs-Number of Steps: 2 Home Layout: One level Home Equipment: Cane - single point;Tub bench;Walker - 2 wheels Additional Comments: Pt is goint to OPPT at [facility]     Prior Function Level of Independence: Independent with assistive device(s)  Comments: uses single point cane and furniture for ambulation, but not always in the house, does her own grocery shopping, still drives, and cleans      Hand Dominance   Dominant Hand: Right    Extremity/Trunk Assessment   Upper Extremity Assessment: Defer to OT evaluation           Lower Extremity Assessment: LLE deficits/detail   LLE Deficits / Details: note left foot  drop and drags with ambualtion, states has been working on it in outpatient rehab, doesn't wear a brace, note swelling bilateral LE's     Communication   Communication: No difficulties  Cognition Arousal/Alertness: Awake/alert Behavior During Therapy: WFL for tasks assessed/performed Overall Cognitive Status: Within Functional Limits for tasks assessed                      General Comments General comments (skin integrity, edema, etc.): Patient with redness in eyes and cheeks she reports is due to severe coughing after sedated MRI and broke blood vessels in her face and eyes    Exercises        Assessment/Plan    PT Assessment Patent does not need any further PT services  PT Diagnosis Abnormality of gait   PT Problem List    PT Treatment Interventions     PT Goals (Current goals can be found in the Care Plan section) Acute Rehab PT Goals PT Goal Formulation: All assessment and education complete, DC therapy    Frequency     Barriers to discharge        Co-evaluation               End of Session   Activity Tolerance: Patient tolerated treatment well Patient left: in chair           Time: 1020-1047 PT Time Calculation (min) (ACUTE ONLY): 27 min   Charges:   PT Evaluation $PT Eval Low Complexity: 1 Procedure PT Treatments $Self Care/Home Management: 8-22   PT G Codes:        Reginia Naas 2015-04-02, 11:37 AM  Magda Kiel, [place] 02-Apr-2015

## 2015-03-17 NOTE — Progress Notes (Signed)
VASCULAR LAB PRELIMINARY  PRELIMINARY  PRELIMINARY  PRELIMINARY  Carotid duplex completed.    Preliminary report:  Bilateral:  1-39% ICA stenosis.  Vertebral artery flow is antegrade.     Maylea Soria, RVS 03/17/2015, 9:41 AM

## 2015-03-17 NOTE — Progress Notes (Signed)
Pt discharging home at this time with her daughter taking all personal belongings. IV discontinued, dry dressing applied. Discharge instructions provided with verbal understanding. Denies pain or discomfort. Pt aware of follow up appts.

## 2015-03-17 NOTE — Care Management Note (Signed)
Case Management Note  Patient Details  Name: Jeanne Lawson MRN: [id number] Date of Birth: 1945-11-06  Subjective/Objective:                    Action/Plan: Patient discharging home with self care. No recs per PT/OT. No further needs per CM.   Expected Discharge Date:                  Expected Discharge Plan:  Home/Self Care  In-House Referral:     Discharge planning Services     Post Acute Care Choice:    Choice offered to:     DME Arranged:    DME Agency:     HH Arranged:    [place] Agency:     Status of Service:  Completed, signed off  Medicare Important Message Given:    Date Medicare IM Given:    Medicare IM give by:    Date Additional Medicare IM Given:    Additional Medicare Important Message give by:     If discussed at [place] of Stay Meetings, dates discussed:    Additional Comments:  Pollie Friar, RN 03/17/2015, 1:09 PM

## 2015-03-17 NOTE — Discharge Instructions (Signed)
Stroke Prevention Some medical conditions and behaviors are associated with an increased chance of having a stroke. You may prevent a stroke by making healthy choices and managing medical conditions. HOW CAN I REDUCE MY RISK OF HAVING A STROKE?   Stay physically active. Get at least 30 minutes of activity on most or all days.  Do not smoke. It may also be helpful to avoid exposure to secondhand smoke.  Limit alcohol use. Moderate alcohol use is considered to be:  No more than 2 drinks per day for men.  No more than 1 drink per day for nonpregnant women.  Eat healthy foods. This involves:  Eating 5 or more servings of fruits and vegetables a day.  Making dietary changes that address high blood pressure (hypertension), high cholesterol, diabetes, or obesity.  Manage your cholesterol levels.  Making food choices that are high in fiber and low in saturated fat, trans fat, and cholesterol may control cholesterol levels.  Take any prescribed medicines to control cholesterol as directed by your health care provider.  Manage your diabetes.  Controlling your carbohydrate and sugar intake is recommended to manage diabetes.  Take any prescribed medicines to control diabetes as directed by your health care provider.  Control your hypertension.  Making food choices that are low in salt (sodium), saturated fat, trans fat, and cholesterol is recommended to manage hypertension.  Ask your health care provider if you need treatment to lower your blood pressure. Take any prescribed medicines to control hypertension as directed by your health care provider.  If you are 18-39 years of age, have your blood pressure checked every 3-5 years. If you are 40 years of age or older, have your blood pressure checked every year.  Maintain a healthy weight.  Reducing calorie intake and making food choices that are low in sodium, saturated fat, trans fat, and cholesterol are recommended to manage  weight.  Stop drug abuse.  Avoid taking birth control pills.  Talk to your health care provider about the risks of taking birth control pills if you are over 35 years old, smoke, get migraines, or have ever had a blood clot.  Get evaluated for sleep disorders (sleep apnea).  Talk to your health care provider about getting a sleep evaluation if you snore a lot or have excessive sleepiness.  Take medicines only as directed by your health care provider.  For some people, aspirin or blood thinners (anticoagulants) are helpful in reducing the risk of forming abnormal blood clots that can lead to stroke. If you have the irregular heart rhythm of atrial fibrillation, you should be on a blood thinner unless there is a good reason you cannot take them.  Understand all your medicine instructions.  Make sure that other conditions (such as anemia or atherosclerosis) are addressed. SEEK IMMEDIATE MEDICAL CARE IF:   You have sudden weakness or numbness of the face, arm, or leg, especially on one side of the body.  Your face or eyelid droops to one side.  You have sudden confusion.  You have trouble speaking (aphasia) or understanding.  You have sudden trouble seeing in one or both eyes.  You have sudden trouble walking.  You have dizziness.  You have a loss of balance or coordination.  You have a sudden, severe headache with no known cause.  You have new chest pain or an irregular heartbeat. Any of these symptoms may represent a serious problem that is an emergency. Do not wait to see if the symptoms will   go away. Get medical help at once. Call your local emergency services (911 in [place] Do not drive yourself to the hospital.   This information is not intended to replace advice given to you by your health care provider. Make sure you discuss any questions you have with your health care provider.   Document Released: 02/17/2004 Document Revised: 01/30/2014 Document Reviewed:  07/12/2012 Elsevier Interactive Patient Education 2016 [employer]  

## 2015-03-17 NOTE — Anesthesia Postprocedure Evaluation (Signed)
Anesthesia Post Note  Patient: Jeanne Lawson  Procedure(s) Performed: Procedure(s) (LRB): MRI OF BRAIN WITHOUT CONTRAST (N/A)  Patient location during evaluation: PACU Anesthesia Type: General Level of consciousness: awake, awake and alert and oriented Pain management: pain level controlled Respiratory status: spontaneous breathing, nonlabored ventilation and respiratory function stable Anesthetic complications: no    Last Vitals:  Filed Vitals:   03/17/15 0507 03/17/15 1017  BP: 134/60 160/86  Pulse: 62 62  Temp: 36.9 C 36.4 C  Resp: 16 16    Last Pain:  Filed Vitals:   03/17/15 1215  PainSc: 0-No pain                 [place] [employer] COKER

## 2015-03-18 MED FILL — Ondansetron HCl Inj 4 MG/2ML (2 MG/ML): INTRAMUSCULAR | Qty: 2 | Status: AC

## 2015-03-18 MED FILL — Phenylephrine-NaCl Pref Syr 0.4 MG/10ML-0.9% (40 MCG/ML): INTRAVENOUS | Qty: 10 | Status: AC

## 2015-03-18 MED FILL — Succinylcholine Chloride Inj 20 MG/ML: INTRAMUSCULAR | Qty: 12 | Status: AC

## 2015-03-18 MED FILL — Fentanyl Citrate Preservative Free (PF) Inj 100 MCG/2ML: INTRAMUSCULAR | Qty: 2 | Status: AC

## 2015-03-18 MED FILL — Propofol IV Emul 200 MG/20ML (10 MG/ML): INTRAVENOUS | Qty: 20 | Status: AC

## 2015-03-18 MED FILL — Dexamethasone Sodium Phosphate Inj 4 MG/ML: INTRAMUSCULAR | Qty: 2 | Status: AC

## 2015-03-18 MED FILL — Lidocaine HCl IV Inj 20 MG/ML: INTRAVENOUS | Qty: 5 | Status: AC

## 2015-03-18 MED FILL — Glycopyrrolate Inj 0.2 MG/ML: INTRAMUSCULAR | Qty: 1 | Status: AC

## 2015-03-19 ENCOUNTER — Other Ambulatory Visit (HOSPITAL_COMMUNITY): Payer: Self-pay | Admitting: Interventional Radiology

## 2015-03-19 DIAGNOSIS — I6350 Cerebral infarction due to unspecified occlusion or stenosis of unspecified cerebral artery: Secondary | ICD-10-CM

## 2015-03-22 ENCOUNTER — Ambulatory Visit: Payer: Medicare Other | Admitting: Physical Therapy

## 2015-03-25 ENCOUNTER — Ambulatory Visit: Payer: Medicare Other | Admitting: Physical Therapy

## 2015-03-29 ENCOUNTER — Ambulatory Visit: Payer: Medicare Other | Admitting: Physical Therapy

## 2015-03-30 ENCOUNTER — Other Ambulatory Visit: Payer: Self-pay | Admitting: Cardiology

## 2015-03-30 ENCOUNTER — Ambulatory Visit: Payer: Medicare Other | Admitting: Physical Therapy

## 2015-04-01 ENCOUNTER — Ambulatory Visit (HOSPITAL_COMMUNITY): Payer: Medicare Other

## 2015-04-05 ENCOUNTER — Ambulatory Visit: Payer: Medicare Other | Admitting: Physical Therapy

## 2015-04-08 ENCOUNTER — Ambulatory Visit: Payer: Medicare Other | Admitting: Physical Therapy

## 2015-04-14 ENCOUNTER — Ambulatory Visit (HOSPITAL_COMMUNITY)
Admission: RE | Admit: 2015-04-14 | Discharge: 2015-04-14 | Disposition: A | Discharge status: Home / Self Care | Payer: Medicare Other | Source: Ambulatory Visit | Attending: Interventional Radiology | Admitting: Interventional Radiology

## 2015-04-14 DIAGNOSIS — I6350 Cerebral infarction due to unspecified occlusion or stenosis of unspecified cerebral artery: Secondary | ICD-10-CM

## 2015-05-10 ENCOUNTER — Ambulatory Visit: Payer: Medicare Other | Attending: Internal Medicine | Admitting: Physical Therapy

## 2015-05-10 ENCOUNTER — Encounter: Payer: Self-pay | Admitting: Physical Therapy

## 2015-05-10 DIAGNOSIS — M6281 Muscle weakness (generalized): Secondary | ICD-10-CM | POA: Insufficient documentation

## 2015-05-10 DIAGNOSIS — R2689 Other abnormalities of gait and mobility: Secondary | ICD-10-CM | POA: Diagnosis present

## 2015-05-10 NOTE — Therapy (Signed)
[facility] [address] [place], [place], [address] Phone: [phone]   Fax:  [phone]  Physical Therapy Evaluation  Patient Details  Name: Jeanne [place] MRN: [id number] Date of Birth: Jun 26, 1945 Referring Provider: Dr. [name]  Encounter Date: 05/10/2015      PT End of Session - 05/10/15 2143    Visit Number 1   Number of Visits 9   Date for PT Re-Evaluation 07/09/15   Authorization Type UHC Medicare   Authorization Time Period 05-10-15 - 07-09-15   PT Start Time 1115   PT Stop Time 1201   PT Time Calculation (min) 46 min      Past Medical History  Diagnosis Date  . Ventricular hypertrophy 04/2009    LVH with diastolic dysfunction by echo. Has normal EF.  Marland Kitchen Pancreatitis     x2  . PAC (premature atrial contraction)   . TIA (transient ischemic attack)     She was hospitalized 04-23-09 through 04-27-09 for involving right side of the body  . Hypertension   . Tobacco abuse     Ongoing   . Edema of foot     She has a history of chronic edema of the left dated back to age 54 when she suufered severe frostbite playing  on the snow as a child  . Coronary artery disease 1996    Known with prior mild lesion of LAD demonstrated by Cardiac Catheterization in 1996  . COPD (chronic obstructive pulmonary disease) ([place])   . Shortness of breath dyspnea     since stroke 2 months ago -  . 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  Marland Kitchen PONV (postoperative nausea and vomiting)     problems waking up last time 4/16 only time  . 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 Neck surgery  50 yrs ago    Left side tumor  . US  echocardiography  04-26-2009    EF 65-70%  . Cardiovascular stress test  12-02-2001    EF 70%  . Cardiac catheterization  1996    Mild CAD with vasospasm  . Abdominal hysterectomy    . Laparoscopy    . Radiology with anesthesia N/A 05/25/2014    Procedure: RADIOLOGY WITH ANESTHESIA;  Surgeon: [name], MD;  Location: [place];  Service: Radiology;  Laterality: N/A;  . Appendectomy    . Radiology with anesthesia N/A 08/12/2014    Procedure: RADIOLOGY WITH ANESTHESIA;  Surgeon: [name], MD;  Location: [place];  Service: Radiology;  Laterality: N/A;  . Endarterectomy Right 08/12/2014    Procedure: RIGHT  COMMON CAROTID ARTERY EXPOSURE FOR INTERVENTIONAL RADIOLOGY PROCEDURE BY DR.DEVASHWAR,Insertion 6 FR sheath;  Surgeon: Elam Dutch, MD;  Location: [facility] OR;  Service: Vascular;  Laterality: Right;  . Endarterectomy Right 08/12/2014    Procedure:  CAROTID  EXPOSURE CLOSURE RIGHT NECK, REPAIR RIGHT COMMON CAROTID ARTERY;  Surgeon: Elam Dutch, MD;  Location: [place];  Service: Vascular;  Laterality: Right;  . Endarterectomy Left 11/18/2014    Procedure: CAROTID EXPOSURE;  Surgeon: Elam Dutch, MD;  Location: [place];  Service: Vascular;  Laterality: Left;  . Endarterectomy Left 11/18/2014    Procedure: CLOSURE CAROTID;  Surgeon: Elam Dutch, MD;  Location: [place];  Service: Vascular;  Laterality: Left;  . Radiology with anesthesia N/A 03/15/2015    Procedure: MRI OF BRAIN WITHOUT CONTRAST;  Surgeon: Medication Radiologist, MD;  Location: [place];  Service: Radiology;  Laterality: N/A;  DR. TAT/MRI    There were no vitals filed for this visit.       Subjective Assessment - 05/10/15 2124    Subjective Pt states she was admitted to [facility] on 03-12-15, just after starting OP PT on 03-09-15; admitted with transient cerebral ischemia - no residual results of this episode at current time   Pertinent History h/o old R CVA in 2010; TIA 04-23-09 - 04-27-09:  CAD:  COPD:  chronic edema  in L foot dated back to age 69: saccular aneurysm, paroxysmal atrial fibrillation, known cerebral aneurysms by MRA in 2011; ventricular hypertrophy:  HTN   Diagnostic tests MRI on 03-17-15; previous MRI's due to previous CVA   Patient Stated Goals increase LLE strength and walk better   Currently in Pain? No/denies            [facility] PT Assessment - 05/10/15 1132    Assessment   Medical Diagnosis Cerebral aneurysms; h/o CVA; transient cerebral ischemia on 03-12-15   Referring Provider Dr. [name]   Onset Date/Surgical Date 03/12/15   Precautions   Precautions Fall   Precaution Comments L foot drags   Restrictions   Weight Bearing Restrictions No   Balance Screen   Has the patient fallen in the past 6 months No   Has the patient had a decrease in activity level because of a fear of falling?  No   Is the patient reluctant to leave their home because of a fear of falling?  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 LE's   Strength   Left Hip Flexion 4-/5   Left Knee Flexion 4/5   Left Knee Extension 4+/5   Left Ankle Dorsiflexion 3-/5   Transfers   Transfers Sit to Stand   Sit to Stand 6: Modified independent (Device/Increase time)   Ambulation/Gait   Ambulation/Gait Yes   Ambulation/Gait Assistance 5: Supervision   Assistive device Straight cane   Gait Pattern Decreased dorsiflexion - left;Poor foot clearance - left;Decreased step length - left;Decreased hip/knee flexion - left;Left foot flat   Ambulation Surface Level;Indoor   Gait velocity 2.43  13.5 secs  with cane   Stairs Yes   Stair Management Technique One rail Left;Alternating pattern  step by step with descension   Number of Stairs 4   Height of Stairs 6   Gait Comments pt amb. 1" 41 secs with 240'   Timed Up and Go Test   Normal TUG (seconds) 12.28                           PT Education - 05/10/15 2140    Education  provided Yes   Education Details verbally instructed and demonstrated standing heel cord stretch for LLE   Person(s) Educated Patient   Methods Explanation;Demonstration   Comprehension Verbalized understanding;Returned demonstration          PT Short Term Goals - 05/10/15 2206    PT SHORT TERM GOAL #1   Title Increase gait velocity to >/= 2.60 ft/sec with cane for incr. gait efficiency.  (06-09-15)   Baseline 2.43 === 13.5 secs   Time 4   Period Weeks   Status New   PT SHORT TERM GOAL #2   Title Pt will report at least 25% improvement in ambulation with incr. ease with L foot clearance.  (06-09-15)   Time 4   Period Weeks   Status New   PT SHORT TERM GOAL #3   Title Incr. amb. distance to at least 300' in 2" 10 secs  (06-07-15)   Baseline 1" 41 secs = 240' with cane   Time 4   Period Weeks   Status New   PT SHORT TERM GOAL #5   Title Independent in HEP for  LLE strengthening exercises.  (06-09-15)   Time 4   Period Weeks   Status New           PT Long Term Goals - 05/10/15 2212    PT LONG TERM GOAL #1   Title Independent in updated HEP for LLE strengthening and stretching (07-09-15)   Time 8   Period Weeks   Status New   PT LONG TERM GOAL #2   Title Report at least 50% improvement in LLE flexibility and ease of movement (6-16--17)   Time 8   Period Weeks   Status New   PT LONG TERM GOAL #4   Title Incr. gait velocity to >/= 2.9 ft/sec with cane for incr. gait efficiency.  (07-09-15)   Baseline 2.43 ft/sec with cane   Time 8   Period Weeks   Status New   PT LONG TERM GOAL #5   Title Amb. 6" nonstop with cane to demo  incr. endurance/activity tolerance.  (07-09-15)   Time 8   Period Weeks   Status New               Plan - 05/10/15 2145    Clinical Impression Statement Pt is a 70 year old lady with h/o CVA several years ago with most recent cerebral aneurysms s/p surgery in April 2016; pt had started OP PT on 03-09-15 and then experienced transient cerebral ischemia on 03-12-15 and was admitted to [facility] ; deficits resolved and pt was dischraged home on 03-18-15; pt  presents with gait and balance deficits and weakness in LLE with edema which limtis AROM and contributes to gait deviations   Rehab Potential Good   PT Frequency 1x / week   PT Duration 8 weeks   PT Treatment/Interventions ADLs/Self Care Home Management;Therapeutic exercise;Therapeutic activities;Functional mobility training;Stair training;Gait training;DME Instruction;Balance training;Neuromuscular re-education;Patient/family education;Orthotic Fit/Training;Passive range of motion   PT Next Visit Plan give pictures for L ankle strengthening, hip flexion and hip abduction in hooklying with band   PT Home Exercise Plan Stretches and strengthening for LLE   Consulted and Agree with Plan of Care Patient      Patient will benefit from skilled therapeutic intervention in order to improve the following deficits and impairments:  Abnormal gait, Cardiopulmonary status limiting activity, Decreased activity tolerance, Decreased balance, Decreased mobility, Decreased strength, Decreased knowledge of use of DME, Decreased  endurance, Decreased coordination, Decreased range of motion, Increased edema, Impaired flexibility  Visit Diagnosis: Other abnormalities of gait and mobility - Plan: PT plan of care cert/re-cert  Muscle weakness (generalized) - Plan: PT plan of care cert/re-cert      G-Codes - [id number] 2214-05-17    Functional Assessment Tool Used TUG score 12.28; gait velocity 2.43 ft/sec   Functional Limitation Mobility: Walking and  moving around   Mobility: Walking and Moving Around Current Status [phone]) At least 40 percent but less than 60 percent impaired, limited or restricted   Mobility: Walking and Moving Around Goal Status [phone]) At least 20 percent but less than 40 percent impaired, limited or restricted       Problem List Patient Active Problem List   Diagnosis Date Noted  . Altered mental status   . Acute CVA (cerebrovascular accident) ([place]) 03/15/2015  . Dysphasia 03/12/2015  . Thrombocytopenia ([place]) 03/12/2015  . Hypokalemia   . Endotracheally intubated   . Essential hypertension   . Intracranial aneurysm 11/18/2014  . Respiratory failure ([place])   . Cerebral aneurysm   . Brain aneurysm   . Aphasia   . Palmar erythema   . CVA (cerebral infarction) 05/02/2014  . Aneurysm ([place]) 05/02/2014  . Expressive aphasia 05/02/2014  . 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 Tobacco abuse 10/24/2010  . TIA (transient ischemic attack)   . Hypertension   . Ventricular hypertrophy   . CVA 02/23/2010  . STRESS FRACTURE, FOOT 02/22/2010    Alda Lea, PT 05/10/2015, 10:20 PM  [facility] [address] [place], [place], [address] Phone: [phone]   Fax:  [phone]  Name: Jeanne [place] MRN: [id number] Date of Birth: August 25, 1945

## 2015-05-25 ENCOUNTER — Ambulatory Visit: Payer: Medicare Other | Attending: Internal Medicine | Admitting: Physical Therapy

## 2015-05-25 DIAGNOSIS — M6281 Muscle weakness (generalized): Secondary | ICD-10-CM

## 2015-05-25 DIAGNOSIS — R2689 Other abnormalities of gait and mobility: Secondary | ICD-10-CM | POA: Insufficient documentation

## 2015-05-28 NOTE — Therapy (Signed)
[facility] [address] [place], [place], [address] Phone: [phone]   Fax:  [phone]  Physical Therapy Treatment  Patient Details  Name: Jeanne [place] MRN: [id number] Date of Birth: [birth year] Referring Provider: Dr. [name]  Encounter Date: 05/25/2015      PT End of Session - 05/28/15 0947    Visit Number 2   Number of Visits 9   Date for PT Re-Evaluation 07/09/15   Authorization Type UHC Medicare   Authorization Time Period 05-10-15 - 07-09-15   PT Start Time 1315   PT Stop Time 1401   PT Time Calculation (min) 46 min      Past Medical History  Diagnosis Date  . Ventricular hypertrophy 04/2009    LVH with diastolic dysfunction by echo. Has normal EF.  Marland Kitchen Pancreatitis     x2  . PAC (premature atrial contraction)   . TIA (transient ischemic attack)     She was hospitalized 04-23-09 through 04-27-09 for involving right side of the body  . Hypertension   . Tobacco abuse     Ongoing   . Edema of foot     She has a history of chronic edema of the left dated back to age 70 when she suufered severe frostbite playing  on the snow as a child  . Coronary artery disease 1996    Known with prior mild lesion of LAD demonstrated by Cardiac Catheterization in 1996  . COPD (chronic obstructive pulmonary disease) ([place])   . Shortness of breath dyspnea     since stroke 2 months ago -  . 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  Marland Kitchen PONV (postoperative nausea and vomiting)     problems waking up last time 4/16 only time  . 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 Neck surgery  50 yrs ago    Left side tumor  . US echocardiography   04-26-2009    EF 65-70%  . Cardiovascular stress test  12-02-2001    EF 70%  . Cardiac catheterization  1996    Mild CAD with vasospasm  . Abdominal hysterectomy    . Laparoscopy    . Radiology with anesthesia N/A 05/25/2014    Procedure: RADIOLOGY WITH ANESTHESIA;  Surgeon: [name], MD;  Location: [place];  Service: Radiology;  Laterality: N/A;  . Appendectomy    . Radiology with anesthesia N/A 08/12/2014    Procedure: RADIOLOGY WITH ANESTHESIA;  Surgeon: [name], MD;  Location: [place];  Service: Radiology;  Laterality: N/A;  . Endarterectomy Right 08/12/2014    Procedure: RIGHT  COMMON CAROTID ARTERY EXPOSURE FOR INTERVENTIONAL RADIOLOGY PROCEDURE BY DR.DEVASHWAR,Insertion 6 FR sheath;  Surgeon: Elam Dutch, MD;  Location: [facility] OR;  Service: Vascular;  Laterality: Right;  . Endarterectomy Right 08/12/2014    Procedure:  CAROTID  EXPOSURE CLOSURE RIGHT NECK, REPAIR RIGHT COMMON CAROTID ARTERY;  Surgeon: Elam Dutch, MD;  Location: [place];  Service: Vascular;  Laterality: Right;  . Endarterectomy Left 11/18/2014    Procedure: CAROTID EXPOSURE;  Surgeon: Elam Dutch, MD;  Location: [place];  Service: Vascular;  Laterality: Left;  . Endarterectomy Left 11/18/2014    Procedure: CLOSURE CAROTID;  Surgeon: Elam Dutch, MD;  Location: [place];  Service: Vascular;  Laterality: Left;  . Radiology with anesthesia N/A 03/15/2015    Procedure: MRI OF BRAIN WITHOUT CONTRAST;  Surgeon: Medication Radiologist, MD;  Location: [place];  Service: Radiology;  Laterality: N/A;  DR. TAT/MRI    There were no vitals filed for this visit.      Subjective Assessment - 05/28/15 0940    Subjective Pt reports no problems or changes since eval 2 weeks ago "I wasn't able to get in last week"   Pertinent History h/o old R CVA in 2010; TIA 04-23-09 - 04-27-09:  CAD:  COPD:  chronic edema in L foot dated back to age 70: saccular aneurysm, paroxysmal atrial fibrillation, known cerebral aneurysms by MRA  in 2011; ventricular hypertrophy:  HTN   Diagnostic tests MRI on 03-17-15; previous MRI's due to previous CVA   Patient Stated Goals increase LLE strength and walk better   Currently in Pain? No/denies                         [facility] Adult PT Treatment/Exercise - 05/28/15 0001    Ambulation/Gait   Ambulation/Gait Yes   Ambulation/Gait Assistance 5: Supervision   Ambulation Distance (Feet) 150 Feet   Assistive device Straight cane   Gait Pattern Decreased dorsiflexion - left;Poor foot clearance - left;Decreased step length - left;Decreased hip/knee flexion - left;Left foot flat   Ambulation Surface Level;Indoor   Lumbar Exercises: Stretches   Active Hamstring Stretch 1 rep;30 seconds   Lumbar Exercises: Standing   Heel Raises 10 reps  LLE only 10 reps   Knee/Hip Exercises: Machines for Strengthening   Cybex Leg Press 50# bil. LE's 15 reps:  LLE only 30# 10 reps   Knee/Hip Exercises: Standing   Forward Step Up Left;1 set;10 reps;Step Height: 6"   Step Down Left;1 set;Step Height: 6"   Other Standing Knee Exercises sidestepping with squats inside bars 10' 2 reps      Single limb plantarflexion LLE x 10 reps with UE support  Standing L hip abduction and extension with green theraband x 10 reps each          PT Short Term Goals - 05/10/15 2206    PT SHORT TERM GOAL #1   Title Increase gait velocity to >/= 2.60 ft/sec with cane for incr. gait efficiency.  (06-09-15)   Baseline 2.43 === 13.5 secs   Time 4   Period Weeks   Status New   PT SHORT TERM GOAL #2   Title Pt will report at least 25% improvement in ambulation with incr. ease with L foot clearance.  (06-09-15)   Time 4   Period Weeks   Status New   PT SHORT TERM GOAL #3   Title Incr. amb. distance to at least 300' in 2" 10 secs  (06-07-15)   Baseline 1" 41 secs = 240' with cane   Time 4   Period Weeks   Status New   PT SHORT TERM GOAL #5   Title Independent in HEP for  LLE strengthening exercises.   (06-09-15)   Time 4   Period Weeks   Status New           PT Long Term Goals - 05/10/15 2212    PT LONG TERM GOAL #1   Title Independent in updated HEP for LLE strengthening  and stretching (07-09-15)   Time 8   Period Weeks   Status New   PT LONG TERM GOAL #2   Title Report at least 50% improvement in LLE flexibility and ease of movement (6-16--17)   Time 8   Period Weeks   Status New   PT LONG TERM GOAL #4   Title Incr. gait velocity to >/= 2.9 ft/sec with cane for incr. gait efficiency.  (07-09-15)   Baseline 2.43 ft/sec with cane   Time 8   Period Weeks   Status New   PT LONG TERM GOAL #5   Title Amb. 6" nonstop with cane to demo incr. endurance/activity tolerance.  (07-09-15)   Time 8   Period Weeks   Status New               Plan - 05/28/15 [id number]    Clinical Impression Statement Pt has decreased L dorsiflexion strength and significant edema in LLE which results in decr. L foot clearance in swing phase of gait   Rehab Potential Good   PT Frequency 1x / week   PT Duration 8 weeks   PT Treatment/Interventions ADLs/Self Care Home Management;Therapeutic exercise;Therapeutic activities;Functional mobility training;Stair training;Gait training;DME Instruction;Balance training;Neuromuscular re-education;Patient/family education;Orthotic Fit/Training;Passive range of motion   PT Next Visit Plan give pictures for L ankle strengthening, hip flexion and hip abduction in hooklying with band   PT Home Exercise Plan Stretches and strengthening for LLE   Consulted and Agree with Plan of Care Patient      Patient will benefit from skilled therapeutic intervention in order to improve the following deficits and impairments:  Abnormal gait, Cardiopulmonary status limiting activity, Decreased activity tolerance, Decreased balance, Decreased mobility, Decreased strength, Decreased knowledge of use of DME, Decreased endurance, Decreased coordination, Decreased range of motion,  Increased edema, Impaired flexibility  Visit Diagnosis: Other abnormalities of gait and mobility  Muscle weakness (generalized)     Problem List Patient Active Problem List   Diagnosis Date Noted  . Altered mental status   . Acute CVA (cerebrovascular accident) ([place]) 03/15/2015  . Dysphasia 03/12/2015  . Thrombocytopenia ([place]) 03/12/2015  . Hypokalemia   . Endotracheally intubated   . Essential hypertension   . Intracranial aneurysm 11/18/2014  . Respiratory failure ([place])   . Cerebral aneurysm   . Brain aneurysm   . Aphasia   . Palmar erythema   . CVA (cerebral infarction) 05/02/2014  . Aneurysm ([place]) 05/02/2014  . Expressive aphasia 05/02/2014  . 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 Tobacco abuse 10/24/2010  . TIA (transient ischemic attack)   . Hypertension   . Ventricular hypertrophy   . CVA 02/23/2010  . STRESS FRACTURE, FOOT 02/22/2010    Alda Lea, PT 05/28/2015, 9:53 AM  [facility] [address] [place], [place], [address] Phone: [phone]   Fax:  [phone]  Name: JUSTYNE BARTLETTE MRN: [id number] Date of Birth: 21-Nov-1945

## 2015-05-31 ENCOUNTER — Ambulatory Visit (INDEPENDENT_AMBULATORY_CARE_PROVIDER_SITE_OTHER): Payer: Medicare Other | Admitting: Neurology

## 2015-05-31 ENCOUNTER — Encounter: Payer: Self-pay | Admitting: Neurology

## 2015-05-31 VITALS — BP 121/69 | HR 49 | Ht 61.0 in | Wt 161.0 lb

## 2015-05-31 DIAGNOSIS — I671 Cerebral aneurysm, nonruptured: Secondary | ICD-10-CM

## 2015-05-31 DIAGNOSIS — I679 Cerebrovascular disease, unspecified: Secondary | ICD-10-CM | POA: Insufficient documentation

## 2015-05-31 DIAGNOSIS — R42 Dizziness and giddiness: Secondary | ICD-10-CM

## 2015-05-31 NOTE — Patient Instructions (Signed)
I had a long d/w patient and her daugfhter about her recent stroke, cerebral aneurysms,risk for recurrent stroke/TIAs, aneurysm rupture, personally independently reviewed imaging studies and stroke evaluation results and answered questions.Continue aspirin 81 mg daily  for secondary stroke prevention and d/w Dr [name] about stopping Plavix due to increased cerebral microhemorrhages and skin bruising and maintain strict control of hypertension with blood pressure goal below 130/90, diabetes with hemoglobin A1c goal below 6.5% and lipids with LDL cholesterol goal below 70 mg/dL. I also advised the patient to eat a healthy diet with plenty of whole grains, cereals, fruits and vegetables, exercise regularly and maintain ideal body weight Followup in the future with me in 6 months or call earlier if necessary Stroke Prevention Some medical conditions and behaviors are associated with an increased chance of having a stroke. You may prevent a stroke by making healthy choices and managing medical conditions. HOW CAN I REDUCE MY RISK OF HAVING A STROKE?   Stay physically active. Get at least 30 minutes of activity on most or all days.  Do not smoke. It may also be helpful to avoid exposure to secondhand smoke.  Limit alcohol use. Moderate alcohol use is considered to be:  No more than 2 drinks per day for men.  No more than 1 drink per day for nonpregnant women.  Eat healthy foods. This involves:  Eating 5 or more servings of fruits and vegetables a day.  Making dietary changes that address high blood pressure (hypertension), high cholesterol, diabetes, or obesity.  Manage your cholesterol levels.  Making food choices that are high in fiber and low in saturated fat, trans fat, and cholesterol may control cholesterol levels.  Take any prescribed medicines to control cholesterol as directed by your health care provider.  Manage your diabetes.  Controlling your carbohydrate and sugar intake is  recommended to manage diabetes.  Take any prescribed medicines to control diabetes as directed by your health care provider.  Control your hypertension.  Making food choices that are low in salt (sodium), saturated fat, trans fat, and cholesterol is recommended to manage hypertension.  Ask your health care provider if you need treatment to lower your blood pressure. Take any prescribed medicines to control hypertension as directed by your health care provider.  If you are 14-75 years of age, have your blood pressure checked every 3-5 years. If you are 32 years of age or older, have your blood pressure checked every year.  Maintain a healthy weight.  Reducing calorie intake and making food choices that are low in sodium, saturated fat, trans fat, and cholesterol are recommended to manage weight.  Stop drug abuse.  Avoid taking birth control pills.  Talk to your health care provider about the risks of taking birth control pills if you are over 90 years old, smoke, get migraines, or have ever had a blood clot.  Get evaluated for sleep disorders (sleep apnea).  Talk to your health care provider about getting a sleep evaluation if you snore a lot or have excessive sleepiness.  Take medicines only as directed by your health care provider.  For some people, aspirin or blood thinners (anticoagulants) are helpful in reducing the risk of forming abnormal blood clots that can lead to stroke. If you have the irregular heart rhythm of atrial fibrillation, you should be on a blood thinner unless there is a good reason you cannot take them.  Understand all your medicine instructions.  Make sure that other conditions (such as anemia or  atherosclerosis) are addressed. SEEK IMMEDIATE MEDICAL CARE IF:   You have sudden weakness or numbness of the face, arm, or leg, especially on one side of the body.  Your face or eyelid droops to one side.  You have sudden confusion.  You have trouble speaking  (aphasia) or understanding.  You have sudden trouble seeing in one or both eyes.  You have sudden trouble walking.  You have dizziness.  You have a loss of balance or coordination.  You have a sudden, severe headache with no known cause.  You have new chest pain or an irregular heartbeat. Any of these symptoms may represent a serious problem that is an emergency. Do not wait to see if the symptoms will go away. Get medical help at once. Call your local emergency services (911 in [place] Do not drive yourself to the hospital.   This information is not intended to replace advice given to you by your health care provider. Make sure you discuss any questions you have with your health care provider.   Document Released: 02/17/2004 Document Revised: 01/30/2014 Document Reviewed: 07/12/2012 Elsevier Interactive Patient Education [employer].

## 2015-05-31 NOTE — Progress Notes (Signed)
[facility] [address] [place]. [place] [address] [phone]       OFFICE FOLLOW-UP NOTE  Jeanne. Jeanne Lawson Date of Birth:  October 03, 1945 Medical Record Number:  [id number]   HPI: Jeanne Lawson  is a 70 year old lady seen today for first office follow-up visit following hospital admission for TIA in February 2017. She is a complaint by daughter today.She was at home 03/12/2015 when at 1030 "she felt fuzzy headed and not right. Daughter noted she was having a hard time getting her thoughts out". This lasted until 12. She was brought to the hospital for fear of her having a stroke. Where her  fully cleared. Patient was not administered TPA . She was admitted for further evaluation and treatment. MRI scan of the brain showed Multiple scattered small round foci of restricted diffusion in the left periatrial white matter and along the lateral aspect of the left occipital horn in the left occipital lobe. There was a chronic age left basal ganglial lacunar infarct as well as multiple chronic but new microhemorrhages compared with previous MRI dated 2016. MRA showed no large vessel stenosis but showed successfully coiled bilateral ICA terminus aneurysms. Transthoracic echo showed normal ejection fraction. EEG showed intermittent left temporal sharp discharges but no definite epileptiform activity. LDL cholesterol was optimal at 63 and hemoglobin A1c was 5.5. Carotid ultrasound showed no significant extracranial stenosis. Patient infarcts on MRI were felt to be clinically silent and her dizziness was felt to be multifactorial and it cleared quickly. She was advised to continue aspirin and Plavix because of intracranial biplane stent aneurysms treatment. She has subsequently reduced and the Plavix to every other day because of significant bruising. She is has had no recurrent stroke or TIA symptoms. She is currently participating in  outpatient physical and occupational therapy. She is concerned  about swelling in her feet. She has no new stroke or TIA symptoms.  ROS:   14 system review of systems is positive for  no complaints except dizziness all other systems negative  PMH:  Past Medical History  Diagnosis Date  . Ventricular hypertrophy 04/2009    LVH with diastolic dysfunction by echo. Has normal EF.  Marland Kitchen Pancreatitis     x2  . PAC (premature atrial contraction)   . TIA (transient ischemic attack)     She was hospitalized 04-23-09 through 04-27-09 for involving right side of the body  . Hypertension   . Tobacco abuse     Ongoing   . Edema of foot     She has a history of chronic edema of the left dated back to age 1 when she suufered severe frostbite playing  on the snow as a child  . Coronary artery disease 1996    Known with prior mild lesion of LAD demonstrated by Cardiac Catheterization in 1996  . COPD (chronic obstructive pulmonary disease) ([place])   . Shortness of breath dyspnea     since stroke 2 months ago -  . 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  Marland Kitchen PONV (postoperative nausea and vomiting)     problems waking up last time 4/16 only time  . 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  Marital Status: Widowed    Spouse Name: N/A  . Number of Children: N/A  . Years of Education: N/A   Occupational History  . Not on file.   Social History Main Topics  . Smoking status: Former Smoker -- 1.00 packs/day for 30 years    Types: Cigarettes    Quit date: 04/26/2014  . Smokeless tobacco: Never Used  . Alcohol Use: No  . Drug Use: No  . Sexual Activity: Not on file   Other Topics Concern  . Not on file   Social History Narrative    Medications:   Current Outpatient Prescriptions on File Prior to Visit  Medication Sig  Dispense Refill  . albuterol (PROVENTIL HFA;VENTOLIN HFA) 108 (90 BASE) MCG/ACT inhaler Inhale 1-2 puffs into the lungs every 6 (six) hours as needed for wheezing or shortness of breath.    Marland Kitchen aspirin 81 MG tablet Take 81 mg by mouth daily.    Marland Kitchen atorvastatin (LIPITOR) 40 MG tablet Take 1 tablet (40 mg total) by mouth daily at 6 PM. 30 tablet 3  . clopidogrel (PLAVIX) 75 MG tablet Take 0.5 tablets (37.5 mg total) by mouth daily.    Marland Kitchen lisinopril-hydrochlorothiazide (PRINZIDE,ZESTORETIC) 20-12.5 MG tablet TAKE 1 TABLET BY MOUTH DAILY. 30 tablet 5  . TOPROL XL 50 MG 24 hr tablet TAKE 1/2 TABLET BY MOUTH TWICE DAILY 30 tablet 5   No current facility-administered medications on file prior to visit.    Allergies:   Allergies  Allergen Reactions  . Dilaudid [Hydromorphone] Swelling  . Codeine Nausea And Vomiting  . Hydromorphone Hcl Swelling  . Latex Swelling    gloves used at dental office caused lips to swell. Used different ones no problem. Never had any problem at hospital or anywhere else.  . Sulfa Drugs Cross Reactors Other (See Comments)    Unknown childhood reaction    Physical Exam General: well developed, well nourished, seated, in no evident distress Head: head normocephalic and atraumatic.  Neck: supple with no carotid or supraclavicular bruits Cardiovascular: regular rate and rhythm, no murmurs Musculoskeletal: no deformity Skin:  no rash/petichiae.Old surgical scar right supraclavicular neck.  1+ pedal edema bilaterally Vascular:  Normal pulses all extremities Filed Vitals:   05/31/15 0908  BP: 121/69  Pulse: 49   Neurologic Exam Mental Status: Awake and fully alert. Oriented to place and time. Recent and remote memory intact. Attention span, concentration and fund of knowledge appropriate. Mood and affect appropriate.  Cranial Nerves: Fundoscopic exam reveals sharp disc margins. Pupils equal, briskly reactive to light. Extraocular movements full without nystagmus. Visual  fields full to confrontation. Hearing intact. Facial sensation intact. Face, tongue, palate moves normally and symmetrically.  Motor: Normal bulk and tone. Normal strength in all tested extremity muscles. Sensory.: intact to touch ,pinprick .position and vibratory sensation.  Coordination: Rapid alternating movements normal in all extremities. Finger-to-nose and heel-to-shin performed accurately bilaterally. Gait and Station: Arises from chair without difficulty. Stance is normal. Gait demonstrates normal stride length and balance . Able to heel, toe and tandem walk without difficulty.  Reflexes: 1+ and symmetric. Toes downgoing.   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 Vascular risk factors of hypertension, CAD,Hyperlipidimia and cerebrovascular disease    PLAN: I had a long d/w patient and her daugfhter about her recent  stroke, cerebral aneurysms,risk for recurrent stroke/TIAs, aneurysm rupture, personally independently reviewed imaging studies and stroke evaluation results and answered questions.Continue aspirin 81 mg daily  for secondary stroke prevention and d/w Dr [name] about stopping Plavix due to increased cerebral microhemorrhages and skin bruising and maintain strict control of hypertension with blood pressure goal below 130/90, diabetes with hemoglobin A1c goal below 6.5% and lipids with LDL cholesterol goal below 70 mg/dL. I also advised the patient to eat a healthy diet with plenty of whole grains, cereals, fruits and vegetables, exercise regularly and maintain ideal body weight Greater than 50% of time during this 25 minute visit was spent on counseling,explanation of diagnosis, planning of further management, discussion with patient and family and coordination of  care .Followup in the future with me in 6 months or call earlier if necessary Antony Contras, MD  [facility] [address] [place], [place] [address]  Phone [phone] Fax [phone]  Note: This document was prepared with digital dictation and possible smart phrase technology. Any transcriptional errors that result from this process are unintentional

## 2015-06-03 ENCOUNTER — Ambulatory Visit: Payer: Medicare Other | Admitting: Physical Therapy

## 2015-06-15 ENCOUNTER — Ambulatory Visit: Payer: Medicare Other | Admitting: Physical Therapy

## 2015-06-15 DIAGNOSIS — M6281 Muscle weakness (generalized): Secondary | ICD-10-CM

## 2015-06-15 DIAGNOSIS — R2689 Other abnormalities of gait and mobility: Secondary | ICD-10-CM | POA: Diagnosis not present

## 2015-06-20 NOTE — Therapy (Signed)
[facility] [address] [place], [place], [address] Phone: [phone]   Fax:  [phone]  Physical Therapy Treatment  Patient Details  Name: Jeanne Lawson MRN: [id number] Date of Birth: 06-23-1945 Referring Provider: Dr. [name]  Encounter Date: 06/15/2015      Jeanne Lawson End of Session - 06/20/15 2033    Visit Number 3   Number of Visits 9   Date for Jeanne Lawson Re-Evaluation 07/09/15   Authorization Type [employer] Medicare   Authorization Time Period 05-10-15 - 07-09-15   Jeanne Lawson Start Time 0847   Jeanne Lawson Stop Time 0931   Jeanne Lawson Time Calculation (min) 44 min      Past Medical History  Diagnosis Date  . Ventricular hypertrophy 04/2009    LVH with diastolic dysfunction by echo. Has normal EF.  Marland Kitchen Pancreatitis     x2  . PAC (premature atrial contraction)   . TIA (transient ischemic attack)     She was hospitalized 04-23-09 through 04-27-09 for involving right side of the body  . Hypertension   . Tobacco abuse     Ongoing   . Edema of foot     She has a history of chronic edema of the left dated back to age 57 when she suufered severe frostbite playing  on the snow as a child  . Coronary artery disease 1996    Known with prior mild lesion of LAD demonstrated by Cardiac Catheterization in 1996  . COPD (chronic obstructive pulmonary disease) ([place])   . Shortness of breath dyspnea     since stroke 2 months ago -  . 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  Marland Kitchen PONV (postoperative nausea and vomiting)     problems waking up last time 4/16 only time  . 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 Neck surgery  50 yrs ago    Left side tumor  . US echocardiography   04-26-2009    EF 65-70%  . Cardiovascular stress test  12-02-2001    EF 70%  . Cardiac catheterization  1996    Mild CAD with vasospasm  . Abdominal hysterectomy    . Laparoscopy    . Radiology with anesthesia N/A 05/25/2014    Procedure: RADIOLOGY WITH ANESTHESIA;  Surgeon: [name], MD;  Location: [place];  Service: Radiology;  Laterality: N/A;  . Appendectomy    . Radiology with anesthesia N/A 08/12/2014    Procedure: RADIOLOGY WITH ANESTHESIA;  Surgeon: [name], MD;  Location: [place];  Service: Radiology;  Laterality: N/A;  . Endarterectomy Right 08/12/2014    Procedure: RIGHT  COMMON CAROTID ARTERY EXPOSURE FOR INTERVENTIONAL RADIOLOGY PROCEDURE BY DR.DEVASHWAR,Insertion 6 FR sheath;  Surgeon: Elam Dutch, MD;  Location: [facility];  Service: Vascular;  Laterality: Right;  . Endarterectomy Right 08/12/2014    Procedure:  CAROTID  EXPOSURE CLOSURE RIGHT NECK, REPAIR RIGHT COMMON CAROTID ARTERY;  Surgeon: Elam Dutch, MD;  Location: [place];  Service: Vascular;  Laterality: Right;  . Endarterectomy Left 11/18/2014    Procedure: CAROTID EXPOSURE;  Surgeon: Elam Dutch, MD;  Location: [place];  Service: Vascular;  Laterality: Left;  . Endarterectomy Left 11/18/2014    Procedure: CLOSURE CAROTID;  Surgeon: Elam Dutch, MD;  Location: [place];  Service: Vascular;  Laterality: Left;  . Radiology with anesthesia N/A 03/15/2015    Procedure: MRI OF BRAIN WITHOUT CONTRAST;  Surgeon: Medication Radiologist, MD;  Location: [place];  Service: Radiology;  Laterality: N/A;  DR. TAT/MRI    There were no vitals filed for this visit.      Subjective Assessment - 06/20/15 2024    Subjective Jeanne Lawson reports she has been doing exercises at home   Pertinent History h/o old R CVA in 2010; TIA 04-23-09 - 04-27-09:  CAD:  COPD:  chronic edema in L foot dated back to age 33: saccular aneurysm, paroxysmal atrial fibrillation, known cerebral aneurysms by MRA in 2011; ventricular hypertrophy:  HTN    Diagnostic tests MRI on 03-17-15; previous MRI's due to previous CVA   Patient Stated Goals increase LLE strength and walk better   Currently in Pain? No/denies                         [facility] Adult Jeanne Lawson Treatment/Exercise - 06/20/15 0001    Transfers   Transfers Sit to Stand   Sit to Stand 5: Supervision   Number of Reps Other reps (comment)  5 reps   Lumbar Exercises: Stretches   Active Hamstring Stretch 1 rep;30 seconds   Lumbar Exercises: Standing   Heel Raises 10 reps  LLE only 10 reps   Knee/Hip Exercises: Stretches   Gastroc Stretch Left;1 rep;30 seconds   Other Knee/Hip Stretches Runner's stretch - LLE - 30 sec hold in standing   Knee/Hip Exercises: Machines for Strengthening   Cybex Leg Press 50# bil. LE's 15 reps:  LLE only 30# 10 reps   Knee/Hip Exercises: Standing   Forward Step Up Left;1 set;10 reps;Step Height: 6"   Step Down Left;1 set;Step Height: 6"     TherEx:  Standing hip flexion, abduction and extension with green theraband 10 reps each leg              Jeanne Lawson Short Term Goals - 05/10/15 2206    Jeanne Lawson SHORT TERM GOAL #1   Title Increase gait velocity to >/= 2.60 ft/sec with cane for incr. gait efficiency.  (06-09-15)   Baseline 2.43 === 13.5 secs   Time 4   Period Weeks   Status New   Jeanne Lawson SHORT TERM GOAL #2   Title Jeanne Lawson will report at least 25% improvement in ambulation with incr. ease with L foot clearance.  (06-09-15)   Time 4   Period Weeks   Status New   Jeanne Lawson SHORT TERM GOAL #3   Title Incr. amb. distance to at least 300' in 2" 10 secs  (06-07-15)   Baseline 1" 41 secs = 240' with cane   Time 4   Period Weeks   Status New   Jeanne Lawson SHORT TERM GOAL #5   Title Independent in HEP for  LLE strengthening exercises.  (06-09-15)   Time 4   Period Weeks   Status New           Jeanne Lawson Long Term Goals - 05/10/15 2212    Jeanne Lawson LONG TERM GOAL #1   Title Independent in updated HEP for LLE strengthening and stretching (07-09-15)   Time 8   Period Weeks    Status New   Jeanne Lawson LONG TERM GOAL #2   Title Report at least 50%  improvement in LLE flexibility and ease of movement (6-16--17)   Time 8   Period Weeks   Status New   Jeanne Lawson LONG TERM GOAL #4   Title Incr. gait velocity to >/= 2.9 ft/sec with cane for incr. gait efficiency.  (07-09-15)   Baseline 2.43 ft/sec with cane   Time 8   Period Weeks   Status New   Jeanne Lawson LONG TERM GOAL #5   Title Amb. 6" nonstop with cane to demo incr. endurance/activity tolerance.  (07-09-15)   Time 8   Period Weeks   Status New               Plan - 06/20/15 2033    Clinical Impression Statement Jeanne Lawson demonstrated improved L foot clearance after performing stretches for LLE - Jeanne Lawson progressing towards goals   Rehab Potential Good   Jeanne Lawson Frequency 1x / week   Jeanne Lawson Duration 8 weeks   Jeanne Lawson Treatment/Interventions ADLs/Self Care Home Management;Therapeutic exercise;Therapeutic activities;Functional mobility training;Stair training;Gait training;DME Instruction;Balance training;Neuromuscular re-education;Patient/family education;Orthotic Fit/Training;Passive range of motion   Jeanne Lawson Next Visit Plan give pictures for L ankle strengthening, hip flexion and hip abduction in hooklying with band   Jeanne Lawson Home Exercise Plan Stretches and strengthening for LLE   Consulted and Agree with Plan of Care Patient      Patient will benefit from skilled therapeutic intervention in order to improve the following deficits and impairments:  Abnormal gait, Cardiopulmonary status limiting activity, Decreased activity tolerance, Decreased balance, Decreased mobility, Decreased strength, Decreased knowledge of use of DME, Decreased endurance, Decreased coordination, Decreased range of motion, Increased edema, Impaired flexibility  Visit Diagnosis: Other abnormalities of gait and mobility  Muscle weakness (generalized)     Problem List Patient Active Problem List   Diagnosis Date Noted  . 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  Aneurysm, cerebral, nonruptured 05/31/2015  . Dizziness and giddiness 05/31/2015  . Altered mental status   . Acute CVA (cerebrovascular accident) ([place]) 03/15/2015  . Dysphasia 03/12/2015  . Thrombocytopenia ([place]) 03/12/2015  . Hypokalemia   . Endotracheally intubated   . Essential hypertension   . Intracranial aneurysm 11/18/2014  . Respiratory failure ([place])   . Cerebral aneurysm   . Brain aneurysm   . Aphasia   . Palmar erythema   . CVA (cerebral infarction) 05/02/2014  . Aneurysm ([place]) 05/02/2014  . Expressive aphasia 05/02/2014  . 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 Tobacco abuse 10/24/2010  . TIA (transient ischemic attack)   . Hypertension   . Ventricular hypertrophy   . CVA 02/23/2010  . STRESS FRACTURE, FOOT 02/22/2010    Jeanne Lawson, Jeanne Lawson 06/20/2015, 8:36 PM  [facility] [address] [place], [place], [address] Phone: [phone]   Fax:  [phone]  Name: Jeanne Lawson MRN: [id number] Date of Birth: [birth year]

## 2015-06-22 ENCOUNTER — Ambulatory Visit: Payer: Medicare Other | Admitting: Physical Therapy

## 2015-06-22 DIAGNOSIS — R2689 Other abnormalities of gait and mobility: Secondary | ICD-10-CM | POA: Diagnosis not present

## 2015-06-22 DIAGNOSIS — M6281 Muscle weakness (generalized): Secondary | ICD-10-CM

## 2015-06-22 NOTE — Therapy (Signed)
[facility] [address] [place], [place], [address] Phone: [phone]   Fax:  [phone]  Physical Therapy Treatment  Patient Details  Name: DEVIAN CRIGLER MRN: [id number] Date of Birth: [birth year] Referring Provider: Dr. [name]  Encounter Date: 06/22/2015      PT End of Session - 06/22/15 1716    Visit Number 4   Number of Visits 9   Date for PT Re-Evaluation 07/09/15   Authorization Type UHC Medicare   Authorization Time Period 05-10-15 - 07-09-15   PT Start Time 0847   PT Stop Time 0933   PT Time Calculation (min) 46 min      Past Medical History  Diagnosis Date  . Ventricular hypertrophy 04/2009    LVH with diastolic dysfunction by echo. Has normal EF.  Marland Kitchen Pancreatitis     x2  . PAC (premature atrial contraction)   . TIA (transient ischemic attack)     She was hospitalized 04-23-09 through 04-27-09 for involving right side of the body  . Hypertension   . Tobacco abuse     Ongoing   . Edema of foot     She has a history of chronic edema of the left dated back to age 3 when she suufered severe frostbite playing  on the snow as a child  . Coronary artery disease 1996    Known with prior mild lesion of LAD demonstrated by Cardiac Catheterization in 1996  . COPD (chronic obstructive pulmonary disease) ([place])   . Shortness of breath dyspnea     since stroke 2 months ago -  . 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  Marland Kitchen PONV (postoperative nausea and vomiting)     problems waking up last time 4/16 only time  . 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 Neck surgery  50 yrs ago    Left side tumor  . US echocardiography   04-26-2009    EF 65-70%  . Cardiovascular stress test  12-02-2001    EF 70%  . Cardiac catheterization  1996    Mild CAD with vasospasm  . Abdominal hysterectomy    . Laparoscopy    . Radiology with anesthesia N/A 05/25/2014    Procedure: RADIOLOGY WITH ANESTHESIA;  Surgeon: [name], MD;  Location: [facility];  Service: Radiology;  Laterality: N/A;  . Appendectomy    . Radiology with anesthesia N/A 08/12/2014    Procedure: RADIOLOGY WITH ANESTHESIA;  Surgeon: [name], MD;  Location: [facility];  Service: Radiology;  Laterality: N/A;  . Endarterectomy Right 08/12/2014    Procedure: RIGHT  COMMON CAROTID ARTERY EXPOSURE FOR INTERVENTIONAL RADIOLOGY PROCEDURE BY DR.DEVASHWAR,Insertion 6 FR sheath;  Surgeon: Elam Dutch, MD;  Location[facility];  Service: Vascular;  Laterality: Right;  . Endarterectomy Right 08/12/2014    Procedure:  CAROTID  EXPOSURE CLOSURE RIGHT NECK, REPAIR RIGHT COMMON CAROTID ARTERY;  Surgeon: Elam Dutch, MD;  Location: [facility];  Service: Vascular;  Laterality: Right;  . Endarterectomy Left 11/18/2014    Procedure: CAROTID EXPOSURE;  Surgeon: Elam Dutch, MD;  Location: [facility];  Service: Vascular;  Laterality: Left;  . Endarterectomy Left 11/18/2014    Procedure: CLOSURE CAROTID;  Surgeon: Elam Dutch, MD;  Location: [facility];  Service: Vascular;  Laterality: Left;  . Radiology with anesthesia N/A 03/15/2015    Procedure: MRI OF BRAIN WITHOUT CONTRAST;  Surgeon: Medication Radiologist, MD;  Location: [facility];  Service: Radiology;  Laterality: N/A;  DR. TAT/MRI    There were no vitals filed for this visit.      Subjective Assessment - 06/22/15 1657    Subjective Pt reports she walked alot and got alot of activity over the weekend as she was caring for her daughter's poodle; states that daughter commented that she was walking better and more fluidly    Pertinent History h/o old R CVA in 2010; TIA 04-23-09 - 04-27-09:  CAD:  COPD:  chronic edema in L foot dated  back to age 83: saccular aneurysm, paroxysmal atrial fibrillation, known cerebral aneurysms by MRA in 2011; ventricular hypertrophy:  HTN   Diagnostic tests MRI on 03-17-15; previous MRI's due to previous CVA   Patient Stated Goals increase LLE strength and walk better   Currently in Pain? No/denies                         [facility] Adult PT Treatment/Exercise - 06/22/15 0905    Ambulation/Gait   Ambulation/Gait Yes   Ambulation/Gait Assistance 5: Supervision   Ambulation Distance (Feet) 240 Feet   Assistive device Straight cane   Gait Pattern Decreased dorsiflexion - left;Poor foot clearance - left;Decreased step length - left;Decreased hip/knee flexion - left;Left foot flat   Ambulation Surface Level;Indoor   Number of Stairs 4   Height of Stairs 6   Lumbar Exercises: Standing   Heel Raises 10 reps  LLE only 10 reps   Knee/Hip Exercises: Stretches   Gastroc Stretch Left;1 rep;30 seconds   Knee/Hip Exercises: Aerobic   Stationary Bike Scifit level 1.5 x 5" with UE's and LE's   Knee/Hip Exercises: Machines for Strengthening   Cybex Leg Press 50# bil. LE's 10 reps:  LLE only 30# 20 reps   Knee/Hip Exercises: Standing   Forward Step Up Left;1 set   Step Down Left;1 set;Step Height: 6"   Other Standing Knee Exercises sidestepping with squats inside bars 10' 2 reps             Balance Exercises - 06/22/15 1703    Balance Exercises: Standing   Sidestepping 2 reps  with UE support prn   Other Standing Exercises crossovers and stepping behind 2 reps down and back inside bars with UE support prn              PT Short Term Goals - 05/10/15 2206    PT SHORT TERM GOAL #1   Title Increase gait velocity to >/= 2.60 ft/sec with cane for incr. gait efficiency.  (06-09-15)   Baseline 2.43 === 13.5 secs   Time 4   Period Weeks   Status New   PT SHORT TERM GOAL #2   Title Pt will report at least 25% improvement in ambulation with incr. ease with L foot clearance.   (06-09-15)   Time 4   Period Weeks   Status New   PT SHORT TERM GOAL #3   Title Incr. amb. distance to at least 300' in 2" 10 secs  (06-07-15)   Baseline 1" 41 secs = 240' with cane   Time 4   Period Weeks  Status New   PT SHORT TERM GOAL #5   Title Independent in HEP for  LLE strengthening exercises.  (06-09-15)   Time 4   Period Weeks   Status New           PT Long Term Goals - 05/10/15 2212    PT LONG TERM GOAL #1   Title Independent in updated HEP for LLE strengthening and stretching (07-09-15)   Time 8   Period Weeks   Status New   PT LONG TERM GOAL #2   Title Report at least 50% improvement in LLE flexibility and ease of movement (6-16--17)   Time 8   Period Weeks   Status New   PT LONG TERM GOAL #4   Title Incr. gait velocity to >/= 2.9 ft/sec with cane for incr. gait efficiency.  (07-09-15)   Baseline 2.43 ft/sec with cane   Time 8   Period Weeks   Status New   PT LONG TERM GOAL #5   Title Amb. 6" nonstop with cane to demo incr. endurance/activity tolerance.  (07-09-15)   Time 8   Period Weeks   Status New               Plan - 06/22/15 1719    Clinical Impression Statement Pt demonstrating imcreased LLE strength and more ease with gait pattern with incr. L foot clearance noted after stretches   Rehab Potential Good   PT Frequency 1x / week   PT Duration 8 weeks   PT Treatment/Interventions ADLs/Self Care Home Management;Therapeutic exercise;Therapeutic activities;Functional mobility training;Stair training;Gait training;DME Instruction;Balance training;Neuromuscular re-education;Patient/family education;Orthotic Fit/Training;Passive range of motion   PT Next Visit Plan give pictures for L ankle strengthening, hip flexion and hip abduction in hooklying with band:  check STG's   PT Home Exercise Plan Stretches and strengthening for LLE   Consulted and Agree with Plan of Care Patient      Patient will benefit from skilled therapeutic intervention in  order to improve the following deficits and impairments:  Abnormal gait, Cardiopulmonary status limiting activity, Decreased activity tolerance, Decreased balance, Decreased mobility, Decreased strength, Decreased knowledge of use of DME, Decreased endurance, Decreased coordination, Decreased range of motion, Increased edema, Impaired flexibility  Visit Diagnosis: Other abnormalities of gait and mobility  Muscle weakness (generalized)     Problem List Patient Active Problem List   Diagnosis Date Noted  . 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 Aneurysm, cerebral, nonruptured 05/31/2015  . Dizziness and giddiness 05/31/2015  . Altered mental status   . Acute CVA (cerebrovascular accident) ([place]) 03/15/2015  . Dysphasia 03/12/2015  . Thrombocytopenia ([place]) 03/12/2015  . Hypokalemia   . Endotracheally intubated   . Essential hypertension   . Intracranial aneurysm 11/18/2014  . Respiratory failure ([place])   . Cerebral aneurysm   . Brain aneurysm   . Aphasia   . Palmar erythema   . CVA (cerebral infarction) 05/02/2014  . Aneurysm ([place]) 05/02/2014  . Expressive aphasia 05/02/2014  . 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 Tobacco abuse 10/24/2010  . TIA (transient ischemic attack)   . Hypertension   . Ventricular hypertrophy   . CVA 02/23/2010  . STRESS FRACTURE, FOOT 02/22/2010    Daisy Lites, [place], PT 06/22/2015, 5:23 PM  [facility] [address] [place], [place], [address] Phone: [phone]   Fax:  [phone]  Name: MASIELA KNAUFF MRN: [id number] Date of Birth: 1945/08/31

## 2015-06-24 ENCOUNTER — Other Ambulatory Visit: Payer: Self-pay | Admitting: Radiology

## 2015-06-24 LAB — PLATELET INHIBITION P2Y12: Platelet Function  P2Y12: 36 [PRU] — ABNORMAL LOW (ref 194–418)

## 2015-06-29 ENCOUNTER — Ambulatory Visit: Payer: Medicare Other | Attending: Internal Medicine | Admitting: Physical Therapy

## 2015-06-29 DIAGNOSIS — M6281 Muscle weakness (generalized): Secondary | ICD-10-CM | POA: Insufficient documentation

## 2015-06-29 DIAGNOSIS — R2689 Other abnormalities of gait and mobility: Secondary | ICD-10-CM | POA: Diagnosis not present

## 2015-06-30 NOTE — Therapy (Signed)
[facility] [address] [place], [place], [address] Phone: [phone]   Fax:  [phone]  Physical Therapy Treatment  Patient Details  Name: Jeanne Lawson MRN: [id number] Date of Birth: 22-Jun-1945 Referring Provider: Dr. [name]  Encounter Date: 06/29/2015      PT End of Session - 06/30/15 1202    Visit Number 5   Number of Visits 9   Date for PT Re-Evaluation 07/09/15   Authorization Type UHC Medicare   Authorization Time Period 05-10-15 - 07-09-15   PT Start Time 0846   PT Stop Time 0931   PT Time Calculation (min) 45 min      Past Medical History  Diagnosis Date  . Ventricular hypertrophy 04/2009    LVH with diastolic dysfunction by echo. Has normal EF.  Marland Kitchen Pancreatitis     x2  . PAC (premature atrial contraction)   . TIA (transient ischemic attack)     She was hospitalized 04-23-09 through 04-27-09 for involving right side of the body  . Hypertension   . Tobacco abuse     Ongoing   . Edema of foot     She has a history of chronic edema of the left dated back to age 88 when she suufered severe frostbite playing  on the snow as a child  . Coronary artery disease 1996    Known with prior mild lesion of LAD demonstrated by Cardiac Catheterization in 1996  . COPD (chronic obstructive pulmonary disease) ([place])   . Shortness of breath dyspnea     since stroke 2 months ago -  . 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  Marland Kitchen PONV (postoperative nausea and vomiting)     problems waking up last time 4/16 only time  . 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 Neck surgery  50 yrs ago    Left side tumor  . US echocardiography   04-26-2009    EF 65-70%  . Cardiovascular stress test  12-02-2001    EF 70%  . Cardiac catheterization  1996    Mild CAD with vasospasm  . Abdominal hysterectomy    . Laparoscopy    . Radiology with anesthesia N/A 05/25/2014    Procedure: RADIOLOGY WITH ANESTHESIA;  Surgeon: [name], MD;  Location: [facility];  Service: Radiology;  Laterality: N/A;  . Appendectomy    . Radiology with anesthesia N/A 08/12/2014    Procedure: RADIOLOGY WITH ANESTHESIA;  Surgeon: [name], MD;  Location: [facility];  Service: Radiology;  Laterality: N/A;  . Endarterectomy Right 08/12/2014    Procedure: RIGHT  COMMON CAROTID ARTERY EXPOSURE FOR INTERVENTIONAL RADIOLOGY PROCEDURE BY DR.DEVASHWAR,Insertion 6 FR sheath;  Surgeon: Elam Dutch, MD;  Location: [facility] OR;  Service: Vascular;  Laterality: Right;  . Endarterectomy Right 08/12/2014    Procedure:  CAROTID  EXPOSURE CLOSURE RIGHT NECK, REPAIR RIGHT COMMON CAROTID ARTERY;  Surgeon: Elam Dutch, MD;  Location: [facility];  Service: Vascular;  Laterality: Right;  . Endarterectomy Left 11/18/2014    Procedure: CAROTID EXPOSURE;  Surgeon: Elam Dutch, MD;  Location: [facility];  Service: Vascular;  Laterality: Left;  . Endarterectomy Left 11/18/2014    Procedure: CLOSURE CAROTID;  Surgeon: Elam Dutch, MD;  Location: [facility];  Service: Vascular;  Laterality: Left;  . Radiology with anesthesia N/A 03/15/2015    Procedure: MRI OF BRAIN WITHOUT CONTRAST;  Surgeon: Medication Radiologist, MD;  Location: [facility];  Service: Radiology;  Laterality: N/A;  DR. TAT/MRI    There were no vitals filed for this visit.      Subjective Assessment - 06/30/15 1151    Subjective Pt states that she is able to flex and lift L foot up very well "and high" first thing in the morning when the swelling is not so bad; edema increases during the day and this limits flexibility of L ankle   Pertinent History h/o old R CVA in 2010; TIA 04-23-09 - 04-27-09:  CAD:  COPD:  chronic edema in  L foot dated back to age 10: saccular aneurysm, paroxysmal atrial fibrillation, known cerebral aneurysms by MRA in 2011; ventricular hypertrophy:  HTN   Diagnostic tests MRI on 03-17-15; previous MRI's due to previous CVA   Patient Stated Goals increase LLE strength and walk better   Currently in Pain? No/denies                         [facility] Adult PT Treatment/Exercise - 06/30/15 0001    Ambulation/Gait   Ambulation/Gait Yes   Ambulation/Gait Assistance 5: Supervision   Ambulation Distance (Feet) 360 Feet  in 3" walk test with cane   Assistive device Straight cane   Gait Pattern Decreased dorsiflexion - left;Poor foot clearance - left;Decreased step length - left;Decreased hip/knee flexion - left;Left foot flat   Ambulation Surface Level;Indoor   Gait velocity 2.43  13.5 secs   Lumbar Exercises: Standing   Heel Raises 10 reps  LLE only 10 reps   Knee/Hip Exercises: Stretches   Gastroc Stretch Left;1 rep;30 seconds   Knee/Hip Exercises: Aerobic   Stationary Bike Nustep level 3 x 5" with UE's and LE's   Knee/Hip Exercises: Machines for Strengthening   Cybex Leg Press 50# bil. LE's 10 reps:  LLE only 30# 20 reps   Knee/Hip Exercises: Standing   Forward Step Up Left;1 set   Other Standing Knee Exercises sidestepping with squats inside bars 10' 2 reps                  PT Short Term Goals - 06/29/15 0855    PT SHORT TERM GOAL #1   Title Increase gait velocity to >/= 2.60 ft/sec with cane for incr. gait efficiency.  (06-09-15)   Baseline 2.43 === 13.5 secs   Status Not Met   PT SHORT TERM GOAL #2   Title Pt will report at least 25% improvement in ambulation with incr. ease with L foot clearance.  (06-09-15)   Baseline fluctuates based on edema- 06-29-15   Status Partially Met   PT SHORT TERM GOAL #3   Title Incr. amb. distance to at least 300' in 2" 10 secs  (06-07-15)   Baseline 360' with cane  - 06-29-15   Status Achieved   PT SHORT TERM GOAL #5   Title  Independent in HEP for  LLE strengthening exercises.  (06-09-15)   Status Achieved           PT Long Term Goals - 05/10/15 2212    PT LONG TERM GOAL #1   Title Independent in updated HEP for LLE strengthening and  stretching (07-09-15)   Time 8   Period Weeks   Status New   PT LONG TERM GOAL #2   Title Report at least 50% improvement in LLE flexibility and ease of movement (6-16--17)   Time 8   Period Weeks   Status New   PT LONG TERM GOAL #4   Title Incr. gait velocity to >/= 2.9 ft/sec with cane for incr. gait efficiency.  (07-09-15)   Baseline 2.43 ft/sec with cane   Time 8   Period Weeks   Status New   PT LONG TERM GOAL #5   Title Amb. 6" nonstop with cane to demo incr. endurance/activity tolerance.  (07-09-15)   Time 8   Period Weeks   Status New               Plan - 06/30/15 1202    Clinical Impression Statement Pt has met STG's #3 and 4:  #1 - gait velocity increased since intial eval but not fully met goal of 2.6 ft/sec with current velocity 2.43 ft/sec:  STG #2 partially met as pt reports some improvement in l foot clearance - this varies depending on edema which increases as day progresses   Rehab Potential Good   PT Frequency 1x / week   PT Duration 8 weeks   PT Treatment/Interventions ADLs/Self Care Home Management;Therapeutic exercise;Therapeutic activities;Functional mobility training;Stair training;Gait training;DME Instruction;Balance training;Neuromuscular re-education;Patient/family education;Orthotic Fit/Training;Passive range of motion   PT Next Visit Plan cont ther ex; pt wishes to HOLD PT for few week due ot vacations and resume in approx. 3 weeks   PT Home Exercise Plan Stretches and strengthening for LLE   Consulted and Agree with Plan of Care Patient      Patient will benefit from skilled therapeutic intervention in order to improve the following deficits and impairments:  Abnormal gait, Cardiopulmonary status limiting activity, Decreased  activity tolerance, Decreased balance, Decreased mobility, Decreased strength, Decreased knowledge of use of DME, Decreased endurance, Decreased coordination, Decreased range of motion, Increased edema, Impaired flexibility  Visit Diagnosis: Other abnormalities of gait and mobility  Muscle weakness (generalized)     Problem List Patient Active Problem List   Diagnosis Date Noted  . 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 Aneurysm, cerebral, nonruptured 05/31/2015  . Dizziness and giddiness 05/31/2015  . Altered mental status   . Acute CVA (cerebrovascular accident) ([place]) 03/15/2015  . Dysphasia 03/12/2015  . Thrombocytopenia ([place]) 03/12/2015  . Hypokalemia   . Endotracheally intubated   . Essential hypertension   . Intracranial aneurysm 11/18/2014  . Respiratory failure ([place])   . Cerebral aneurysm   . Brain aneurysm   . Aphasia   . Palmar erythema   . CVA (cerebral infarction) 05/02/2014  . Aneurysm ([place]) 05/02/2014  . Expressive aphasia 05/02/2014  . 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 Tobacco abuse 10/24/2010  . TIA (transient ischemic attack)   . Hypertension   . Ventricular hypertrophy   . CVA 02/23/2010  . STRESS FRACTURE, FOOT 02/22/2010    Alda Lea, PT 06/30/2015, 12:07 PM  [facility] [address] [place], [place], [address] Phone: [phone]   Fax:  [phone]  Name: TAMERRA MERKLEY MRN: [id number] Date of Birth: 1945/03/09

## 2015-07-09 ENCOUNTER — Telehealth (HOSPITAL_COMMUNITY): Payer: Self-pay | Admitting: *Deleted

## 2015-07-09 NOTE — Telephone Encounter (Signed)
Called and spoke with patient, per Dr. [name] she is to take ASA 81mg  daily and Plivix 37.5mg  every other day.  Needs to come back for repeat P2Y12 in 5-7 days.  Pt repeated instructions back to me and had no further questions

## 2015-07-09 NOTE — Telephone Encounter (Signed)
Called and left message for patient to call me back regarding medication change. Per Dr. [name]

## 2015-07-15 ENCOUNTER — Other Ambulatory Visit: Payer: Self-pay | Admitting: Radiology

## 2015-07-15 LAB — PLATELET INHIBITION P2Y12: Platelet Function  P2Y12: 97 [PRU] — ABNORMAL LOW (ref 194–418)

## 2015-07-22 ENCOUNTER — Telehealth (HOSPITAL_COMMUNITY): Payer: Self-pay | Admitting: *Deleted

## 2015-07-22 NOTE — Telephone Encounter (Signed)
Called and left message with patient to continue current dose of medication.  To have a copy of current med list when she travels.   Left call back number if she has any questions or concerns

## 2015-07-22 NOTE — Telephone Encounter (Signed)
Returned call.  Confirmed patient is to take Plavix  1/2 day every other day and continue ASA 81mg 

## 2015-08-23 ENCOUNTER — Other Ambulatory Visit: Payer: Self-pay | Admitting: *Deleted

## 2015-08-23 MED ORDER — LISINOPRIL-HYDROCHLOROTHIAZIDE 20-12.5 MG PO TABS: 1.0000 | ORAL_TABLET | Freq: Every day | ORAL | 0 refills | Status: DC

## 2015-08-23 MED ORDER — TOPROL XL 50 MG PO TB24: ORAL_TABLET | ORAL | 1 refills | Status: DC

## 2015-09-07 ENCOUNTER — Ambulatory Visit: Payer: Medicare Other | Attending: Internal Medicine | Admitting: Physical Therapy

## 2015-09-07 DIAGNOSIS — M6281 Muscle weakness (generalized): Secondary | ICD-10-CM | POA: Diagnosis present

## 2015-09-07 DIAGNOSIS — R2689 Other abnormalities of gait and mobility: Secondary | ICD-10-CM | POA: Diagnosis present

## 2015-09-11 NOTE — Therapy (Signed)
[facility] [address] [place], [place], [address] Phone: [phone]   Fax:  [phone]  Physical Therapy Treatment  Patient Details  Name: Jeanne Lawson MRN: [id number] Date of Birth: 1945-08-21 Referring Provider: Dr. [name]  Encounter Date: 09/07/2015      PT End of Session - 09/11/15 2148    Visit Number 6  G1   Number of Visits 10   Date for PT Re-Evaluation 10/08/15   Authorization Type [employer] Medicare   Authorization Time Period 09-07-15 - 11-06-15   PT Start Time 1147   PT Stop Time 1234   PT Time Calculation (min) 47 min      Past Medical History:  Diagnosis Date  . COPD (chronic obstructive pulmonary disease) ([place])   . Coronary artery disease 1996   Known with prior mild lesion of LAD demonstrated by Cardiac Catheterization in 1996  . Edema of foot    She has a history of chronic edema of the left dated back to age 70 when she suufered severe frostbite playing  on the snow as a child  . Hypertension   . PAC (premature atrial contraction)   . Pancreatitis    x2  . PONV (postoperative nausea and vomiting)    problems waking up last time 4/16 only time  . 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  Marland Kitchen Shortness of breath dyspnea    since stroke 2 months ago -  . 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 TIA (transient ischemic attack)    She was hospitalized 04-23-09 through 04-27-09 for involving right side of the body  . Tobacco abuse    Ongoing   . Ventricular hypertrophy 04/2009   LVH with diastolic dysfunction by echo. Has normal EF.    Past Surgical History:  Procedure Laterality Date  . ABDOMINAL HYSTERECTOMY    . APPENDECTOMY    . CARDIAC CATHETERIZATION  1996   Mild CAD with vasospasm  .  CARDIOVASCULAR STRESS TEST  12-02-2001   EF 70%  . ENDARTERECTOMY Right 08/12/2014   Procedure: RIGHT  COMMON CAROTID ARTERY EXPOSURE FOR INTERVENTIONAL RADIOLOGY PROCEDURE BY DR.DEVASHWAR,Insertion 6 FR sheath;  Surgeon: Elam Dutch, MD;  Location: [facility];  Service: Vascular;  Laterality: Right;  . ENDARTERECTOMY Right 08/12/2014   Procedure:  CAROTID  EXPOSURE CLOSURE RIGHT NECK, REPAIR RIGHT COMMON CAROTID ARTERY;  Surgeon: Elam Dutch, MD;  Location: [place] [facility];  Service: Vascular;  Laterality: Right;  . ENDARTERECTOMY Left 11/18/2014   Procedure: CAROTID EXPOSURE;  Surgeon: Elam Dutch, MD;  Location: [facility];  Service: Vascular;  Laterality: Left;  . ENDARTERECTOMY Left 11/18/2014   Procedure: CLOSURE CAROTID;  Surgeon: Elam Dutch, MD;  Location: [facility];  Service: Vascular;  Laterality: Left;  . LAPAROSCOPY    . NECK SURGERY  50 yrs ago   Left side tumor  . RADIOLOGY WITH ANESTHESIA N/A 05/25/2014   Procedure: RADIOLOGY WITH ANESTHESIA;  Surgeon: [name], MD;  Location: [place];  Service: Radiology;  Laterality: N/A;  . RADIOLOGY WITH ANESTHESIA N/A 08/12/2014   Procedure: RADIOLOGY WITH ANESTHESIA;  Surgeon: [name], MD;  Location: [facility];  Service: Radiology;  Laterality: N/A;  . RADIOLOGY WITH ANESTHESIA N/A 03/15/2015  Procedure: MRI OF BRAIN WITHOUT CONTRAST;  Surgeon: Medication Radiologist, MD;  Location: [facility];  Service: Radiology;  Laterality: N/A;  DR. TAT/MRI  . US ECHOCARDIOGRAPHY  04-26-2009   EF 65-70%  . VESICOVAGINAL FISTULA CLOSURE W/ TAH  25 yrs ago    There were no vitals filed for this visit.      Subjective Assessment - 09/11/15 2143    Subjective Pt states she has been at beach this summer - has done exercises some; reports having swelling in bil. LE's which impacts mobility and flexibility   Pertinent History h/o old R CVA in 2010; TIA 04-23-09 - 04-27-09:  CAD:  COPD:  chronic edema in L foot dated back to age 70: saccular aneurysm,  paroxysmal atrial fibrillation, known cerebral aneurysms by MRA in 2011; ventricular hypertrophy:  HTN   Diagnostic tests MRI on 03-17-15; previous MRI's due to previous CVA   Patient Stated Goals increase LLE Lawson and walk better   Currently in Pain? No/denies                         [facility] Adult PT Treatment/Exercise - 09/11/15 0001      Transfers   Transfers Sit to Stand   Sit to Stand 5: Supervision   Number of Reps Other reps (comment)  5 reps     Ambulation/Gait   Ambulation/Gait Yes   Ambulation/Gait Assistance 5: Supervision   Ambulation Distance (Feet) 120 Feet   Assistive device Straight cane   Gait Pattern Decreased dorsiflexion - left;Poor foot clearance - left;Decreased step length - left;Decreased hip/knee flexion - left;Left foot flat   Ambulation Surface Level;Indoor     Lumbar Exercises: Stretches   Active Hamstring Stretch 1 rep;30 seconds     Lumbar Exercises: Standing   Heel Raises 10 reps  LLE only 10 reps     Knee/Hip Exercises: Stretches   Press photographer Left;1 rep;30 seconds   Other Knee/Hip Stretches Runner's stretch - LLE - 30 sec hold in standing     Knee/Hip Exercises: Aerobic   Recumbent Bike Scifit level 1.5 x 5" with UE's and LE's     Knee/Hip Exercises: Standing   Forward Step Up Left;1 set   Step Down Left;1 set;Step Height: 6"   Other Standing Knee Exercises bil. hip flexion, extension and abduction with red theraband x 10 reps each in standing     Knee/Hip Exercises: Seated   Hamstring Curl Strengthening;Left;1 set;10 reps  with red theraband                  PT Short Term Goals - 06/29/15 0855      PT SHORT TERM GOAL #1   Title Increase gait velocity to >/= 2.60 ft/sec with cane for incr. gait efficiency.  (06-09-15)   Baseline 2.43 === 13.5 secs   Status Not Met     PT SHORT TERM GOAL #2   Title Pt will report at least 25% improvement in ambulation with incr. ease with L foot clearance.  (06-09-15)    Baseline fluctuates based on edema- 06-29-15   Status Partially Met     PT SHORT TERM GOAL #3   Title Incr. amb. distance to at least 300' in 2" 10 secs  (06-07-15)   Baseline 360' with cane  - 06-29-15   Status Achieved     PT SHORT TERM GOAL #5   Title Independent in HEP for  LLE strengthening exercises.  (06-09-15)  Status Achieved           PT Long Term Goals - 05/10/15 2212      PT LONG TERM GOAL #1   Title Independent in updated HEP for LLE strengthening and stretching (07-09-15)   Time 8   Period Weeks   Status New     PT LONG TERM GOAL #2   Title Report at least 50% improvement in LLE flexibility and ease of movement (6-16--17)   Time 8   Period Weeks   Status New     PT LONG TERM GOAL #4   Title Incr. gait velocity to >/= 2.9 ft/sec with cane for incr. gait efficiency.  (07-09-15)   Baseline 2.43 ft/sec with cane   Time 8   Period Weeks   Status New     PT LONG TERM GOAL #5   Title Amb. 6" nonstop with cane to demo incr. endurance/activity tolerance.  (07-09-15)   Time 8   Period Weeks   Status New               Plan - 09/11/15 2150    Clinical Impression Statement Pt continues to have decr. L knee flexion and decr. L hip flexion in swing phase of gait; edema in L foot and ankle limits dorsiflexion and results in toes scufifng floor at times: renewal to be completed with plan to cont for 4 more session   Rehab Potential Good   PT Frequency 1x / week   PT Duration 4 weeks   PT Treatment/Interventions ADLs/Self Care Home Management;Therapeutic exercise;Therapeutic activities;Functional mobility training;Stair training;Gait training;DME Instruction;Balance training;Neuromuscular re-education;Patient/family education;Orthotic Fit/Training;Passive range of motion   PT Next Visit Plan cont ther ex   PT Home Exercise Plan Stretches and strengthening for LLE   Consulted and Agree with Plan of Care Patient      Patient will benefit from skilled therapeutic  intervention in order to improve the following deficits and impairments:  Abnormal gait, Cardiopulmonary status limiting activity, Decreased activity tolerance, Decreased balance, Decreased mobility, Decreased Lawson, Decreased knowledge of use of DME, Decreased endurance, Decreased coordination, Decreased range of motion, Increased edema, Impaired flexibility  Visit Diagnosis: Other abnormalities of gait and mobility - Plan: PT plan of care cert/re-cert  Muscle weakness (generalized) - Plan: PT plan of care cert/re-cert     Problem List Patient Active Problem List   Diagnosis Date Noted  . 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 Aneurysm, cerebral, nonruptured 05/31/2015  . Dizziness and giddiness 05/31/2015  . Altered mental status   . Acute CVA (cerebrovascular accident) ([place]) 03/15/2015  . Dysphasia 03/12/2015  . Thrombocytopenia ([place]) 03/12/2015  . Hypokalemia   . Endotracheally intubated   . Essential hypertension   . Intracranial aneurysm 11/18/2014  . Respiratory failure ([place])   . Cerebral aneurysm   . Brain aneurysm   . Aphasia   . Palmar erythema   . CVA (cerebral infarction) 05/02/2014  . Aneurysm ([place]) 05/02/2014  . Expressive aphasia 05/02/2014  . 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 Tobacco abuse 10/24/2010  . TIA (transient ischemic attack)   . Hypertension   . Ventricular hypertrophy   . CVA 02/23/2010  . STRESS FRACTURE, FOOT 02/22/2010    Jeanne Lawson, PT 09/11/2015, 10:02 PM  [facility] [address] [place], [place], [address] Phone: [phone]   Fax:  [phone]  Name: Jeanne Lawson MRN: [id number] Date of Birth: 1945-05-31

## 2015-09-14 ENCOUNTER — Ambulatory Visit: Payer: Medicare Other | Admitting: Physical Therapy

## 2015-09-14 DIAGNOSIS — R2689 Other abnormalities of gait and mobility: Secondary | ICD-10-CM | POA: Diagnosis not present

## 2015-09-14 DIAGNOSIS — M6281 Muscle weakness (generalized): Secondary | ICD-10-CM

## 2015-09-15 NOTE — Therapy (Signed)
[facility] [address] [place], [place], [address] Phone: [phone]   Fax:  [phone]  Physical Therapy Treatment  Patient Details  Name: Jeanne Lawson MRN: [id number] Date of Birth: 08-07-1945 Referring Provider: Dr. [name]  Encounter Date: 09/14/2015      PT End of Session - 09/15/15 1646    Visit Number 7  G2   Number of Visits 10   Date for PT Re-Evaluation 10/08/15   Authorization Type UHC Medicare   Authorization Time Period 09-07-15 - 11-06-15   PT Start Time 1017   PT Stop Time 1101   PT Time Calculation (min) 44 min      Past Medical History:  Diagnosis Date  . COPD (chronic obstructive pulmonary disease) ([place])   . Coronary artery disease 1996   Known with prior mild lesion of LAD demonstrated by Cardiac Catheterization in 1996  . Edema of foot    She has a history of chronic edema of the left dated back to age 13 when she suufered severe frostbite playing  on the snow as a child  . Hypertension   . PAC (premature atrial contraction)   . Pancreatitis    x2  . PONV (postoperative nausea and vomiting)    problems waking up last time 4/16 only time  . 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  Marland Kitchen Shortness of breath dyspnea    since stroke 2 months ago -  . 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 TIA (transient ischemic attack)    She was hospitalized 04-23-09 through 04-27-09 for involving right side of the body  . Tobacco abuse    Ongoing   . Ventricular hypertrophy 04/2009   LVH with diastolic dysfunction by echo. Has normal EF.    Past Surgical History:  Procedure Laterality Date  . ABDOMINAL HYSTERECTOMY    . APPENDECTOMY    . CARDIAC CATHETERIZATION  1996   Mild CAD with vasospasm  .  CARDIOVASCULAR STRESS TEST  12-02-2001   EF 70%  . ENDARTERECTOMY Right 08/12/2014   Procedure: RIGHT  COMMON CAROTID ARTERY EXPOSURE FOR INTERVENTIONAL RADIOLOGY PROCEDURE BY DR.DEVASHWAR,Insertion 6 FR sheath;  Surgeon: Elam Dutch, MD;  Location: [facility];  Service: Vascular;  Laterality: Right;  . ENDARTERECTOMY Right 08/12/2014   Procedure:  CAROTID  EXPOSURE CLOSURE RIGHT NECK, REPAIR RIGHT COMMON CAROTID ARTERY;  Surgeon: Elam Dutch, MD;  Location: [facility];  Service: Vascular;  Laterality: Right;  . ENDARTERECTOMY Left 11/18/2014   Procedure: CAROTID EXPOSURE;  Surgeon: Elam Dutch, MD;  Location: [place];  Service: Vascular;  Laterality: Left;  . ENDARTERECTOMY Left 11/18/2014   Procedure: CLOSURE CAROTID;  Surgeon: Elam Dutch, MD;  Location: [facility];  Service: Vascular;  Laterality: Left;  . LAPAROSCOPY    . NECK SURGERY  50 yrs ago   Left side tumor  . RADIOLOGY WITH ANESTHESIA N/A 05/25/2014   Procedure: RADIOLOGY WITH ANESTHESIA;  Surgeon: [name], MD;  Location: [facility];  Service: Radiology;  Laterality: N/A;  . RADIOLOGY WITH ANESTHESIA N/A 08/12/2014   Procedure: RADIOLOGY WITH ANESTHESIA;  Surgeon: [name], MD;  Location: [facility];  Service: Radiology;  Laterality: N/A;  . RADIOLOGY WITH ANESTHESIA N/A 03/15/2015  Procedure: MRI OF BRAIN WITHOUT CONTRAST;  Surgeon: Medication Radiologist, MD;  Location: [facility];  Service: Radiology;  Laterality: N/A;  DR. TAT/MRI  . US ECHOCARDIOGRAPHY  04-26-2009   EF 65-70%  . VESICOVAGINAL FISTULA CLOSURE W/ TAH  25 yrs ago    There were no vitals filed for this visit.      Subjective Assessment - 09/15/15 1643    Subjective Pt reports no problems or changes since last visit   Pertinent History h/o old R CVA in 2010; TIA 04-23-09 - 04-27-09:  CAD:  COPD:  chronic edema in L foot dated back to age 27: saccular aneurysm, paroxysmal atrial fibrillation, known cerebral aneurysms by MRA in 2011; ventricular hypertrophy:  HTN    Diagnostic tests MRI on 03-17-15; previous MRI's due to previous CVA   Patient Stated Goals increase LLE strength and walk better   Currently in Pain? No/denies                         [facility] Adult PT Treatment/Exercise - 09/15/15 0001      Transfers   Transfers Sit to Stand   Sit to Stand 5: Supervision   Number of Reps Other reps (comment)  5 reps   Comments R foot o balance bubble for incr. LLE weight bearing     Ambulation/Gait   Stairs Yes   Stairs Assistance 5: Supervision   Stair Management Technique One rail Left;Alternating pattern  step by step with descension   Number of Stairs 4   Height of Stairs 6     Lumbar Exercises: Stretches   Active Hamstring Stretch 1 rep;30 seconds     Lumbar Exercises: Standing   Heel Raises 10 reps  LLE only 10 reps     Knee/Hip Exercises: Stretches   Gastroc Stretch Left;1 rep;30 seconds     Knee/Hip Exercises: Aerobic   Recumbent Bike Scifit level 1.5 x 5" with UE's and LE's     Knee/Hip Exercises: Machines for Strengthening   Cybex Leg Press 50# bil. LE's 10 reps:  LLE only 30# 20 reps     Knee/Hip Exercises: Standing   Forward Step Up Left;1 set;Step Height: 6";10 reps   Step Down Left;1 set;Step Height: 6"     standing L hip flexion with knee flexed - extend L knee, then slowly lower-- 10 reps with minimal UE support  Stepping over and back of beam with 2# weight on LLE Lifting L leg up onto 6" step with 2# weight on  - 10 reps with UE support prn             PT Short Term Goals - 06/29/15 0855      PT SHORT TERM GOAL #1   Title Increase gait velocity to >/= 2.60 ft/sec with cane for incr. gait efficiency.  (06-09-15)   Baseline 2.43 === 13.5 secs   Status Not Met     PT SHORT TERM GOAL #2   Title Pt will report at least 25% improvement in ambulation with incr. ease with L foot clearance.  (06-09-15)   Baseline fluctuates based on edema- 06-29-15   Status Partially Met     PT SHORT TERM  GOAL #3   Title Incr. amb. distance to at least 300' in 2" 10 secs  (06-07-15)   Baseline 360' with cane  - 06-29-15   Status Achieved     PT SHORT TERM GOAL #5   Title Independent in HEP for  LLE strengthening exercises.  (06-09-15)   Status Achieved           PT Long Term Goals - 09/15/15 1652      PT LONG TERM GOAL #1   Title Independent in updated HEP for LLE strengthening and stretching (07-09-15)   Baseline Extend LTG's til 10-12-15     PT LONG TERM GOAL #2   Baseline target date 10-12-15     PT LONG TERM GOAL #4   Title Incr. gait velocity to >/= 2.9 ft/sec with cane for incr. gait efficiency.  (07-09-15)   Baseline 2.43 ft/sec with cane     PT LONG TERM GOAL #5   Title Amb. 6" nonstop with cane to demo incr. endurance/activity tolerance.  (07-09-15)/ 10-12-15               Plan - 09/15/15 1648    Clinical Impression Statement Pt has decr. strength in L hip and knee flexors with decr. dorsiflexion resulting in decr. L foot clearance in swing phase of gait; edema in L foot and ankle limits flexibility    Rehab Potential Good   PT Frequency 1x / week   PT Duration 4 weeks   PT Treatment/Interventions ADLs/Self Care Home Management;Therapeutic exercise;Therapeutic activities;Functional mobility training;Stair training;Gait training;DME Instruction;Balance training;Neuromuscular re-education;Patient/family education;Orthotic Fit/Training;Passive range of motion   PT Next Visit Plan cont ther ex   PT Home Exercise Plan Stretches and strengthening for LLE   Consulted and Agree with Plan of Care Patient      Patient will benefit from skilled therapeutic intervention in order to improve the following deficits and impairments:  Abnormal gait, Cardiopulmonary status limiting activity, Decreased activity tolerance, Decreased balance, Decreased mobility, Decreased strength, Decreased knowledge of use of DME, Decreased endurance, Decreased coordination, Decreased range of motion,  Increased edema, Impaired flexibility  Visit Diagnosis: Other abnormalities of gait and mobility  Muscle weakness (generalized)     Problem List Patient Active Problem List   Diagnosis Date Noted  . 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 Aneurysm, cerebral, nonruptured 05/31/2015  . Dizziness and giddiness 05/31/2015  . Altered mental status   . Acute CVA (cerebrovascular accident) ([place]) 03/15/2015  . Dysphasia 03/12/2015  . Thrombocytopenia ([place]) 03/12/2015  . Hypokalemia   . Endotracheally intubated   . Essential hypertension   . Intracranial aneurysm 11/18/2014  . Respiratory failure ([place])   . Cerebral aneurysm   . Brain aneurysm   . Aphasia   . Palmar erythema   . CVA (cerebral infarction) 05/02/2014  . Aneurysm ([place]) 05/02/2014  . Expressive aphasia 05/02/2014  . 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 Tobacco abuse 10/24/2010  . TIA (transient ischemic attack)   . Hypertension   . Ventricular hypertrophy   . CVA 02/23/2010  . STRESS FRACTURE, FOOT 02/22/2010    Jeanne Lawson, PT 09/15/2015, 4:56 PM  [facility] [address] [place], [place], [address] Phone: [phone]   Fax:  [phone]  Name: Jeanne Lawson MRN: [id number] Date of Birth: 03-Sep-1945

## 2015-09-21 ENCOUNTER — Ambulatory Visit: Payer: Medicare Other | Admitting: Physical Therapy

## 2015-09-21 DIAGNOSIS — R2689 Other abnormalities of gait and mobility: Secondary | ICD-10-CM

## 2015-09-21 DIAGNOSIS — M6281 Muscle weakness (generalized): Secondary | ICD-10-CM

## 2015-09-22 NOTE — Therapy (Signed)
[facility] [address] [place], [place], [address] Phone: [phone]   Fax:  [phone]  Physical Therapy Treatment  Patient Details  Name: Jeanne Lawson MRN: [id number] Date of Birth: [birth year] Referring Provider: Dr. [name]  Encounter Date: 09/21/2015      PT End of Session - 09/22/15 2039    Visit Number 8   Number of Visits 10   Date for PT Re-Evaluation 10/08/15   Authorization Type UHC Medicare   Authorization Time Period 09-07-15 - 11-06-15   PT Start Time 0846   PT Stop Time 0931   PT Time Calculation (min) 45 min      Past Medical History:  Diagnosis Date  . COPD (chronic obstructive pulmonary disease) ([place])   . Coronary artery disease 1996   Known with prior mild lesion of LAD demonstrated by Cardiac Catheterization in 1996  . Edema of foot    She has a history of chronic edema of the left dated back to age 70 when she suufered severe frostbite playing  on the snow as a child  . Hypertension   . PAC (premature atrial contraction)   . Pancreatitis    x2  . PONV (postoperative nausea and vomiting)    problems waking up last time 4/16 only time  . 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  Marland Kitchen Shortness of breath dyspnea    since stroke 2 months ago -  . 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 TIA (transient ischemic attack)    She was hospitalized 04-23-09 through 04-27-09 for involving right side of the body  . Tobacco abuse    Ongoing   . Ventricular hypertrophy 04/2009   LVH with diastolic dysfunction by echo. Has normal EF.    Past Surgical History:  Procedure Laterality Date  . ABDOMINAL HYSTERECTOMY    . APPENDECTOMY    . CARDIAC CATHETERIZATION  1996   Mild CAD with vasospasm  . CARDIOVASCULAR  STRESS TEST  12-02-2001   EF 70%  . ENDARTERECTOMY Right 08/12/2014   Procedure: RIGHT  COMMON CAROTID ARTERY EXPOSURE FOR INTERVENTIONAL RADIOLOGY PROCEDURE BY DR.DEVASHWAR,Insertion 6 FR sheath;  Surgeon: Elam Dutch, MD;  Location: [facility];  Service: Vascular;  Laterality: Right;  . ENDARTERECTOMY Right 08/12/2014   Procedure:  CAROTID  EXPOSURE CLOSURE RIGHT NECK, REPAIR RIGHT COMMON CAROTID ARTERY;  Surgeon: Elam Dutch, MD;  Location: [facility];  Service: Vascular;  Laterality: Right;  . ENDARTERECTOMY Left 11/18/2014   Procedure: CAROTID EXPOSURE;  Surgeon: Elam Dutch, MD;  Location: [facility];  Service: Vascular;  Laterality: Left;  . ENDARTERECTOMY Left 11/18/2014   Procedure: CLOSURE CAROTID;  Surgeon: Elam Dutch, MD;  Location: [facility];  Service: Vascular;  Laterality: Left;  . LAPAROSCOPY    . NECK SURGERY  50 yrs ago   Left side tumor  . RADIOLOGY WITH ANESTHESIA N/A 05/25/2014   Procedure: RADIOLOGY WITH ANESTHESIA;  Surgeon: [name], MD;  Location: [facility];  Service: Radiology;  Laterality: N/A;  . RADIOLOGY WITH ANESTHESIA N/A 08/12/2014   Procedure: RADIOLOGY WITH ANESTHESIA;  Surgeon: [name], MD;  Location: [facility];  Service: Radiology;  Laterality: N/A;  . RADIOLOGY WITH ANESTHESIA N/A 03/15/2015   Procedure: MRI  OF BRAIN WITHOUT CONTRAST;  Surgeon: Medication Radiologist, MD;  Location: [facility];  Service: Radiology;  Laterality: N/A;  DR. TAT/MRI  . US ECHOCARDIOGRAPHY  04-26-2009   EF 65-70%  . VESICOVAGINAL FISTULA CLOSURE W/ TAH  25 yrs ago    There were no vitals filed for this visit.      Subjective Assessment - 09/22/15 2035    Subjective Pt reports she is dragging L foot more today due to increased swelling in it this am -  "I've been on it for a while"   Pertinent History h/o old R CVA in 2010; TIA 04-23-09 - 04-27-09:  CAD:  COPD:  chronic edema in L foot dated back to age 70: saccular aneurysm, paroxysmal atrial fibrillation, known cerebral  aneurysms by MRA in 2011; ventricular hypertrophy:  HTN   Diagnostic tests MRI on 03-17-15; previous MRI's due to previous CVA   Patient Stated Goals increase LLE strength and walk better   Currently in Pain? No/denies                         [facility] Adult PT Treatment/Exercise - 09/22/15 0001      Transfers   Transfers Sit to Stand   Sit to Stand 5: Supervision   Number of Reps Other reps (comment)  5     Lumbar Exercises: Stretches   Active Hamstring Stretch 1 rep;30 seconds     Lumbar Exercises: Standing   Heel Raises 10 reps  LLE only 10 reps     Knee/Hip Exercises: Stretches   Gastroc Stretch Left;1 rep;30 seconds   Other Knee/Hip Stretches Runner's stretch - LLE - 30 sec hold in standing     Knee/Hip Exercises: Machines for Strengthening   Cybex Leg Press 40# bil. LE's 10 reps:  LLE only 25# 20 reps     Knee/Hip Exercises: Standing   Forward Step Up Left;1 set;Step Height: 6";10 reps   Step Down Left;1 set;Step Height: 6"      NeuroRe-ed; L single limb stance activities inside // bars with UE support prn with CGA:  Standing - L hip and knee flexion, Knee extension and then placing foot back on floor - 5 reps  Marching in place 10 reps each leg  Stepping over and back of balance beam with each leg - 5 reps each with UE support prn             PT Short Term Goals - 06/29/15 0855      PT SHORT TERM GOAL #1   Title Increase gait velocity to >/= 2.60 ft/sec with cane for incr. gait efficiency.  (06-09-15)   Baseline 2.43 === 13.5 secs   Status Not Met     PT SHORT TERM GOAL #2   Title Pt will report at least 25% improvement in ambulation with incr. ease with L foot clearance.  (06-09-15)   Baseline fluctuates based on edema- 06-29-15   Status Partially Met     PT SHORT TERM GOAL #3   Title Incr. amb. distance to at least 300' in 2" 10 secs  (06-07-15)   Baseline 360' with cane  - 06-29-15   Status Achieved     PT SHORT TERM GOAL #5   Title  Independent in HEP for  LLE strengthening exercises.  (06-09-15)   Status Achieved           PT Long Term Goals - 09/15/15 1652      PT LONG  TERM GOAL #1   Title Independent in updated HEP for LLE strengthening and stretching (07-09-15)   Baseline Extend LTG's til 10-12-15     PT LONG TERM GOAL #2   Baseline target date 10-12-15     PT LONG TERM GOAL #4   Title Incr. gait velocity to >/= 2.9 ft/sec with cane for incr. gait efficiency.  (07-09-15)   Baseline 2.43 ft/sec with cane     PT LONG TERM GOAL #5   Title Amb. 6" nonstop with cane to demo incr. endurance/activity tolerance.  (07-09-15)/ 10-12-15               Plan - 09/22/15 2039    Clinical Impression Statement Pt had incr. edema in L foot and ankle today, resulting in decr. flexibility with less dorsiflexion resulting in decr. foot clearance in swing phase of gait   Rehab Potential Good   PT Frequency 1x / week   PT Duration 4 weeks   PT Treatment/Interventions ADLs/Self Care Home Management;Therapeutic exercise;Therapeutic activities;Functional mobility training;Stair training;Gait training;DME Instruction;Balance training;Neuromuscular re-education;Patient/family education;Orthotic Fit/Training;Passive range of motion   PT Next Visit Plan cont ther ex   PT Home Exercise Plan Stretches and strengthening for LLE   Consulted and Agree with Plan of Care Patient      Patient will benefit from skilled therapeutic intervention in order to improve the following deficits and impairments:  Abnormal gait, Cardiopulmonary status limiting activity, Decreased activity tolerance, Decreased balance, Decreased mobility, Decreased strength, Decreased knowledge of use of DME, Decreased endurance, Decreased coordination, Decreased range of motion, Increased edema, Impaired flexibility  Visit Diagnosis: Other abnormalities of gait and mobility  Muscle weakness (generalized)     Problem List Patient Active Problem List    Diagnosis Date Noted  . 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 Aneurysm, cerebral, nonruptured 05/31/2015  . Dizziness and giddiness 05/31/2015  . Altered mental status   . Acute CVA (cerebrovascular accident) ([place]) 03/15/2015  . Dysphasia 03/12/2015  . Thrombocytopenia ([place]) 03/12/2015  . Hypokalemia   . Endotracheally intubated   . Essential hypertension   . Intracranial aneurysm 11/18/2014  . Respiratory failure ([place])   . Cerebral aneurysm   . Brain aneurysm   . Aphasia   . Palmar erythema   . CVA (cerebral infarction) 05/02/2014  . Aneurysm ([place]) 05/02/2014  . Expressive aphasia 05/02/2014  . 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 Tobacco abuse 10/24/2010  . TIA (transient ischemic attack)   . Hypertension   . Ventricular hypertrophy   . CVA 02/23/2010  . STRESS FRACTURE, FOOT 02/22/2010    Kendricks Reap, [place], PT 09/22/2015, 8:45 PM  [facility] [address] [place], [place], [address] Phone: [phone]   Fax:  [phone]  Name: ADAIRA CENTOLA MRN: [id number] Date of Birth: 12-Nov-1945

## 2015-09-28 ENCOUNTER — Other Ambulatory Visit (HOSPITAL_COMMUNITY): Payer: Self-pay | Admitting: Interventional Radiology

## 2015-09-28 DIAGNOSIS — I729 Aneurysm of unspecified site: Secondary | ICD-10-CM

## 2015-10-05 ENCOUNTER — Ambulatory Visit: Payer: Medicare Other | Admitting: Physical Therapy

## 2015-10-11 ENCOUNTER — Ambulatory Visit (HOSPITAL_COMMUNITY): Payer: Medicare Other

## 2015-10-11 ENCOUNTER — Ambulatory Visit (HOSPITAL_COMMUNITY)
Admission: RE | Admit: 2015-10-11 | Discharge: 2015-10-11 | Disposition: A | Discharge status: Home / Self Care | Payer: Medicare Other | Source: Ambulatory Visit | Attending: Interventional Radiology | Admitting: Interventional Radiology

## 2015-10-11 ENCOUNTER — Encounter (HOSPITAL_COMMUNITY): Payer: Self-pay

## 2015-10-11 DIAGNOSIS — I729 Aneurysm of unspecified site: Secondary | ICD-10-CM | POA: Insufficient documentation

## 2015-10-11 DIAGNOSIS — I671 Cerebral aneurysm, nonruptured: Secondary | ICD-10-CM | POA: Insufficient documentation

## 2015-10-11 LAB — POCT I-STAT CREATININE: CREATININE: 0.8 mg/dL (ref 0.44–1.00)

## 2015-10-11 MED ORDER — IOPAMIDOL (ISOVUE-370) INJECTION 76%
INTRAVENOUS | Status: AC
  Administered 2015-10-11: 50 mL
  Filled 2015-10-11: qty 50

## 2015-10-12 ENCOUNTER — Ambulatory Visit: Payer: Medicare Other | Attending: Internal Medicine | Admitting: Physical Therapy

## 2015-10-12 DIAGNOSIS — R2689 Other abnormalities of gait and mobility: Secondary | ICD-10-CM

## 2015-10-12 DIAGNOSIS — M6281 Muscle weakness (generalized): Secondary | ICD-10-CM | POA: Diagnosis present

## 2015-10-13 NOTE — Therapy (Signed)
[facility] [address] [place], [place], [address] Phone: [phone]   Fax:  [phone]  Physical Therapy Treatment  Patient Details  Name: Jeanne Lawson MRN: [id number] Date of Birth: 1945-03-16 Referring Provider: Dr. [name]  Encounter Date: 10/12/2015      PT End of Session - 10/13/15 2008    Visit Number 9   Number of Visits 10   Date for PT Re-Evaluation 11/06/15   Authorization Type UHC Medicare   Authorization Time Period 09-07-15 - 11-06-15   PT Start Time 1450   PT Stop Time 1533   PT Time Calculation (min) 43 min      Past Medical History:  Diagnosis Date  . COPD (chronic obstructive pulmonary disease) ([place])   . Coronary artery disease 1996   Known with prior mild lesion of LAD demonstrated by Cardiac Catheterization in 1996  . Edema of foot    She has a history of chronic edema of the left dated back to age 1 when she suufered severe frostbite playing  on the snow as a child  . Hypertension   . PAC (premature atrial contraction)   . Pancreatitis    x2  . PONV (postoperative nausea and vomiting)    problems waking up last time 4/16 only time  . 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  Marland Kitchen Shortness of breath dyspnea    since stroke 2 months ago -  . 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 TIA (transient ischemic attack)    She was hospitalized 04-23-09 through 04-27-09 for involving right side of the body  . Tobacco abuse    Ongoing   . Ventricular hypertrophy 04/2009   LVH with diastolic dysfunction by echo. Has normal EF.    Past Surgical History:  Procedure Laterality Date  . ABDOMINAL HYSTERECTOMY    . APPENDECTOMY    . CARDIAC CATHETERIZATION  1996   Mild CAD with vasospasm  . CARDIOVASCULAR  STRESS TEST  12-02-2001   EF 70%  . ENDARTERECTOMY Right 08/12/2014   Procedure: RIGHT  COMMON CAROTID ARTERY EXPOSURE FOR INTERVENTIONAL RADIOLOGY PROCEDURE BY DR.DEVASHWAR,Insertion 6 FR sheath;  Surgeon: Elam Dutch, MD;  Location: [facility];  Service: Vascular;  Laterality: Right;  . ENDARTERECTOMY Right 08/12/2014   Procedure:  CAROTID  EXPOSURE CLOSURE RIGHT NECK, REPAIR RIGHT COMMON CAROTID ARTERY;  Surgeon: Elam Dutch, MD;  Location: [facility];  Service: Vascular;  Laterality: Right;  . ENDARTERECTOMY Left 11/18/2014   Procedure: CAROTID EXPOSURE;  Surgeon: Elam Dutch, MD;  Location: [facility];  Service: Vascular;  Laterality: Left;  . ENDARTERECTOMY Left 11/18/2014   Procedure: CLOSURE CAROTID;  Surgeon: Elam Dutch, MD;  Location: [facility];  Service: Vascular;  Laterality: Left;  . LAPAROSCOPY    . NECK SURGERY  50 yrs ago   Left side tumor  . RADIOLOGY WITH ANESTHESIA N/A 05/25/2014   Procedure: RADIOLOGY WITH ANESTHESIA;  Surgeon: [name], MD;  Location: [facility];  Service: Radiology;  Laterality: N/A;  . RADIOLOGY WITH ANESTHESIA N/A 08/12/2014   Procedure: RADIOLOGY WITH ANESTHESIA;  Surgeon: [name], MD;  Location: [facility];  Service: Radiology;  Laterality: N/A;  . RADIOLOGY WITH ANESTHESIA N/A 03/15/2015   Procedure: MRI  OF BRAIN WITHOUT CONTRAST;  Surgeon: Medication Radiologist, MD;  Location: [facility];  Service: Radiology;  Laterality: N/A;  DR. TAT/MRI  . US ECHOCARDIOGRAPHY  04-26-2009   EF 65-70%  . VESICOVAGINAL FISTULA CLOSURE W/ TAH  25 yrs ago    There were no vitals filed for this visit.      Subjective Assessment - 10/13/15 2004    Subjective "It's afternoon, I'm tired - I'm used to coming in the morning"; pt reports she has been busy - hasn't had alot of time to do HEP   Pertinent History h/o old R CVA in 2010; TIA 04-23-09 - 04-27-09:  CAD:  COPD:  chronic edema in L foot dated back to age 67: saccular aneurysm, paroxysmal atrial fibrillation, known  cerebral aneurysms by MRA in 2011; ventricular hypertrophy:  HTN   Patient Stated Goals increase LLE strength and walk better   Currently in Pain? No/denies                         [facility] Adult PT Treatment/Exercise - 10/13/15 0001      Lumbar Exercises: Stretches   Active Hamstring Stretch 1 rep;30 seconds     Lumbar Exercises: Standing   Heel Raises 10 reps  LLE only 10 reps     Knee/Hip Exercises: Stretches   Gastroc Stretch Left;1 rep;30 seconds   Other Knee/Hip Stretches Runner's stretch - LLE - 30 sec hold in standing     Knee/Hip Exercises: Aerobic   Recumbent Bike Scifit level 1.5 x 5" with UE's and LE's     Knee/Hip Exercises: Machines for Strengthening   Cybex Leg Press 30# bil. LE's 15 reps:  LLE only 25# 2 sets 10 reps     Knee/Hip Exercises: Standing   Heel Raises Both;10 reps   Forward Step Up Left;1 set;Step Height: 6";10 reps   Step Down Left;1 set;Step Height: 6"   Functional Squat 1 set;10 reps  on BOSU   Other Standing Knee Exercises bil. hip flexion, extension and abduction with red theraband x 10 reps each in standing       Neuro Re-ed:  L single limb stance activities - stepping over and back of black balance beam with RLE for improved L SLS Standing on BOSU with L foot in middle - moving RLE up/back and laterally - 10 reps each direction            PT Short Term Goals - 06/29/15 0855      PT SHORT TERM GOAL #1   Title Increase gait velocity to >/= 2.60 ft/sec with cane for incr. gait efficiency.  (06-09-15)   Baseline 2.43 === 13.5 secs   Status Not Met     PT SHORT TERM GOAL #2   Title Pt will report at least 25% improvement in ambulation with incr. ease with L foot clearance.  (06-09-15)   Baseline fluctuates based on edema- 06-29-15   Status Partially Met     PT SHORT TERM GOAL #3   Title Incr. amb. distance to at least 300' in 2" 10 secs  (06-07-15)   Baseline 360' with cane  - 06-29-15   Status Achieved     PT SHORT  TERM GOAL #5   Title Independent in HEP for  LLE strengthening exercises.  (06-09-15)   Status Achieved           PT Long Term Goals - 09/15/15 1652      PT LONG TERM  GOAL #1   Title Independent in updated HEP for LLE strengthening and stretching (07-09-15)   Baseline Extend LTG's til 10-12-15     PT LONG TERM GOAL #2   Baseline target date 10-12-15     PT LONG TERM GOAL #4   Title Incr. gait velocity to >/= 2.9 ft/sec with cane for incr. gait efficiency.  (07-09-15)   Baseline 2.43 ft/sec with cane     PT LONG TERM GOAL #5   Title Amb. 6" nonstop with cane to demo incr. endurance/activity tolerance.  (07-09-15)/ 10-12-15               Plan - 10/13/15 2009    Clinical Impression Statement Pt had significant edema in LLE - ankle and foot resulting in decr. flexibility with decr. foot clearance in swing phase    Rehab Potential Good   PT Frequency 1x / week   PT Duration 4 weeks   PT Treatment/Interventions ADLs/Self Care Home Management;Therapeutic exercise;Therapeutic activities;Functional mobility training;Stair training;Gait training;DME Instruction;Balance training;Neuromuscular re-education;Patient/family education;Orthotic Fit/Training;Passive range of motion   PT Next Visit Plan cont ther ex   PT Home Exercise Plan Stretches and strengthening for LLE   Consulted and Agree with Plan of Care Patient      Patient will benefit from skilled therapeutic intervention in order to improve the following deficits and impairments:  Abnormal gait, Cardiopulmonary status limiting activity, Decreased activity tolerance, Decreased balance, Decreased mobility, Decreased strength, Decreased knowledge of use of DME, Decreased endurance, Decreased coordination, Decreased range of motion, Increased edema, Impaired flexibility  Visit Diagnosis: Other abnormalities of gait and mobility  Muscle weakness (generalized)     Problem List Patient Active Problem List   Diagnosis Date Noted   . 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 Aneurysm, cerebral, nonruptured 05/31/2015  . Dizziness and giddiness 05/31/2015  . Altered mental status   . Acute CVA (cerebrovascular accident) ([place]) 03/15/2015  . Dysphasia 03/12/2015  . Thrombocytopenia ([place]) 03/12/2015  . Hypokalemia   . Endotracheally intubated   . Essential hypertension   . Intracranial aneurysm 11/18/2014  . Respiratory failure ([place])   . Cerebral aneurysm   . Brain aneurysm   . Aphasia   . Palmar erythema   . CVA (cerebral infarction) 05/02/2014  . Aneurysm ([place]) 05/02/2014  . Expressive aphasia 05/02/2014  . 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 Tobacco abuse 10/24/2010  . TIA (transient ischemic attack)   . Hypertension   . Ventricular hypertrophy   . CVA 02/23/2010  . STRESS FRACTURE, FOOT 02/22/2010    Alda Lea, PT 10/13/2015, 8:17 PM  [facility] [address] [place], [place], [address] Phone: [phone]   Fax:  [phone]  Name: HASINA KREAGER MRN: [id number] Date of Birth: 1945/06/26

## 2015-10-14 ENCOUNTER — Encounter: Payer: Self-pay | Admitting: Cardiology

## 2015-10-14 ENCOUNTER — Telehealth (HOSPITAL_COMMUNITY): Payer: Self-pay

## 2015-10-14 NOTE — Telephone Encounter (Signed)
Pt agreed to f/u in 6 months with MRI. Stated that she will need sedation for MRI. AW

## 2015-10-19 ENCOUNTER — Ambulatory Visit: Payer: Medicare Other | Admitting: Physical Therapy

## 2015-10-19 DIAGNOSIS — R2689 Other abnormalities of gait and mobility: Secondary | ICD-10-CM | POA: Diagnosis not present

## 2015-10-19 DIAGNOSIS — M6281 Muscle weakness (generalized): Secondary | ICD-10-CM

## 2015-10-20 NOTE — Therapy (Signed)
[facility] [address] [place], [place], [address] Phone: [phone]   Fax:  [phone]  Physical Therapy Treatment  Patient Details  Name: Jeanne Lawson MRN: [id number] Date of Birth: 19-Apr-1945 Referring Provider: Dr. [name]  Encounter Date: 10/19/2015      PT End of Session - 10/20/15 2031    Visit Number 10   Number of Visits 14   Date for PT Re-Evaluation 11/06/15   Authorization Type [employer] Medicare   Authorization Time Period 09-07-15 - 11-06-15   PT Start Time 0848   PT Stop Time 0932   PT Time Calculation (min) 44 min      Past Medical History:  Diagnosis Date  . COPD (chronic obstructive pulmonary disease) ([place])   . Coronary artery disease 1996   Known with prior mild lesion of LAD demonstrated by Cardiac Catheterization in 1996  . Edema of foot    She has a history of chronic edema of the left dated back to age 70 when she suufered severe frostbite playing  on the snow as a child  . Hypertension   . PAC (premature atrial contraction)   . Pancreatitis    x2  . PONV (postoperative nausea and vomiting)    problems waking up last time 4/16 only time  . 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  Marland Kitchen Shortness of breath dyspnea    since stroke 2 months ago -  . 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 TIA (transient ischemic attack)    She was hospitalized 04-23-09 through 04-27-09 for involving right side of the body  . Tobacco abuse    Ongoing   . Ventricular hypertrophy 04/2009   LVH with diastolic dysfunction by echo. Has normal EF.    Past Surgical History:  Procedure Laterality Date  . ABDOMINAL HYSTERECTOMY    . APPENDECTOMY    . CARDIAC CATHETERIZATION  1996   Mild CAD with vasospasm  . CARDIOVASCULAR  STRESS TEST  12-02-2001   EF 70%  . ENDARTERECTOMY Right 08/12/2014   Procedure: RIGHT  COMMON CAROTID ARTERY EXPOSURE FOR INTERVENTIONAL RADIOLOGY PROCEDURE BY DR.DEVASHWAR,Insertion 6 FR sheath;  Surgeon: Elam Dutch, MD;  Location: [facility];  Service: Vascular;  Laterality: Right;  . ENDARTERECTOMY Right 08/12/2014   Procedure:  CAROTID  EXPOSURE CLOSURE RIGHT NECK, REPAIR RIGHT COMMON CAROTID ARTERY;  Surgeon: Elam Dutch, MD;  Location: [facility];  Service: Vascular;  Laterality: Right;  . ENDARTERECTOMY Left 11/18/2014   Procedure: CAROTID EXPOSURE;  Surgeon: Elam Dutch, MD;  Location: [facility];  Service: Vascular;  Laterality: Left;  . ENDARTERECTOMY Left 11/18/2014   Procedure: CLOSURE CAROTID;  Surgeon: Elam Dutch, MD;  Location: [facility];  Service: Vascular;  Laterality: Left;  . LAPAROSCOPY    . NECK SURGERY  50 yrs ago   Left side tumor  . RADIOLOGY WITH ANESTHESIA N/A 05/25/2014   Procedure: RADIOLOGY WITH ANESTHESIA;  Surgeon: [name], MD;  Location: [place];  Service: Radiology;  Laterality: N/A;  . RADIOLOGY WITH ANESTHESIA N/A 08/12/2014   Procedure: RADIOLOGY WITH ANESTHESIA;  Surgeon: [name], MD;  Location: [facility];  Service: Radiology;  Laterality: N/A;  . RADIOLOGY WITH ANESTHESIA N/A 03/15/2015   Procedure: MRI  OF BRAIN WITHOUT CONTRAST;  Surgeon: Medication Radiologist, MD;  Location: [facility];  Service: Radiology;  Laterality: N/A;  DR. TAT/MRI  . US ECHOCARDIOGRAPHY  04-26-2009   EF 65-70%  . VESICOVAGINAL FISTULA CLOSURE W/ TAH  25 yrs ago    There were no vitals filed for this visit.      Subjective Assessment - 10/20/15 2028    Subjective Pt reports no new problems - states in the early am she can walk without dragging L leg/foot due to minimal edema   Pertinent History h/o old R CVA in 2010; TIA 04-23-09 - 04-27-09:  CAD:  COPD:  chronic edema in L foot dated back to age 70: saccular aneurysm, paroxysmal atrial fibrillation, known cerebral  aneurysms by MRA in 2011; ventricular hypertrophy:  HTN   Diagnostic tests MRI on 03-17-15; previous MRI's due to previous CVA   Patient Stated Goals increase LLE strength and walk better   Currently in Pain? No/denies                         [facility] Adult PT Treatment/Exercise - 10/20/15 0001      Lumbar Exercises: Standing   Heel Raises 10 reps  LLE only 10 reps     Knee/Hip Exercises: Stretches   Gastroc Stretch Left;1 rep;30 seconds   Other Knee/Hip Stretches Runner's stretch - LLE - 30 sec hold in standing     Knee/Hip Exercises: Machines for Strengthening   Cybex Leg Press 30# bil. LE's 10 reps:  LLE only 25# 2 sets 10 reps     Knee/Hip Exercises: Standing   Heel Raises Both;10 reps   Forward Step Up Left;1 set;Step Height: 6";10 reps   Step Down Left;1 set;Step Height: 6"   Functional Squat 1 set;10 reps  on BOSU   Other Standing Knee Exercises sidestepping with squats inside bars 10' 2 reps   Other Standing Knee Exercises bil. hip flexion, extension and abduction with red theraband x 10 reps each in standing                  PT Short Term Goals - 06/29/15 0855      PT SHORT TERM GOAL #1   Title Increase gait velocity to >/= 2.60 ft/sec with cane for incr. gait efficiency.  (06-09-15)   Baseline 2.43 === 13.5 secs   Status Not Met     PT SHORT TERM GOAL #2   Title Pt will report at least 25% improvement in ambulation with incr. ease with L foot clearance.  (06-09-15)   Baseline fluctuates based on edema- 06-29-15   Status Partially Met     PT SHORT TERM GOAL #3   Title Incr. amb. distance to at least 300' in 2" 10 secs  (06-07-15)   Baseline 360' with cane  - 06-29-15   Status Achieved     PT SHORT TERM GOAL #5   Title Independent in HEP for  LLE strengthening exercises.  (06-09-15)   Status Achieved           PT Long Term Goals - 10/20/15 2032      PT LONG TERM GOAL #1   Title Independent in updated HEP for LLE strengthening and  stretching (07-09-15)   Baseline Extend LTG's til 10-12-15   Status Achieved     PT LONG TERM GOAL #2   Title Report at least 50% improvement in LLE flexibility and ease of movement (6-16--17)/ 11-06-15   Baseline  inconsistently met   Time 4   Period Weeks   Status Partially Met     PT LONG TERM GOAL #4   Title Incr. gait velocity to >/= 2.9 ft/sec with cane for incr. gait efficiency.  (07-09-15)/ 11-06-15   Baseline 2.43 ft/sec with cane   Time 4   Period Weeks   Status On-going     PT LONG TERM GOAL #5   Title Amb. 6" nonstop with cane to demo incr. endurance/activity tolerance.  (07-09-15)/ 10-12-15/ 11-06-15   Time 4   Period Weeks   Status On-going               Plan - 10/20/15 03-19-32    Clinical Impression Statement Pt has met LTG #1 and partially met other goals - edema in LLE limits flexibility and fatigue impacts movement and ease of movement of LLE; continue LTG's til 11-06-15   Rehab Potential Good   PT Frequency 1x / week   PT Duration 4 weeks   PT Treatment/Interventions ADLs/Self Care Home Management;Therapeutic exercise;Therapeutic activities;Functional mobility training;Stair training;Gait training;DME Instruction;Balance training;Neuromuscular re-education;Patient/family education;Orthotic Fit/Training;Passive range of motion   PT Next Visit Plan cont ther ex   PT Home Exercise Plan Stretches and strengthening for LLE   Consulted and Agree with Plan of Care Patient      Patient will benefit from skilled therapeutic intervention in order to improve the following deficits and impairments:  Abnormal gait, Cardiopulmonary status limiting activity, Decreased activity tolerance, Decreased balance, Decreased mobility, Decreased strength, Decreased knowledge of use of DME, Decreased endurance, Decreased coordination, Decreased range of motion, Increased edema, Impaired flexibility  Visit Diagnosis: Other abnormalities of gait and mobility  Muscle weakness  (generalized)       G-Codes - 11-02-15 03/19/34    Functional Assessment Tool Used TUG score 12.28; gait velocity 2.43 ft/sec   Functional Limitation Mobility: Walking and moving around   Mobility: Walking and Moving Around Current Status [phone]) At least 40 percent but less than 60 percent impaired, limited or restricted   Mobility: Walking and Moving Around Goal Status [phone]) At least 20 percent but less than 40 percent impaired, limited or restricted      Problem List Patient Active Problem List   Diagnosis Date Noted  . 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 Aneurysm, cerebral, nonruptured 05/31/2015  . Dizziness and giddiness 05/31/2015  . Altered mental status   . Acute CVA (cerebrovascular accident) ([place]) 03/15/2015  . Dysphasia 03/12/2015  . Thrombocytopenia ([place]) 03/12/2015  . Hypokalemia   . Endotracheally intubated   . Essential hypertension   . Intracranial aneurysm 11/18/2014  . Respiratory failure ([place])   . Cerebral aneurysm   . Brain aneurysm   . Aphasia   . Palmar erythema   . CVA (cerebral infarction) 05/02/2014  . Aneurysm ([place]) 05/02/2014  . Expressive aphasia 05/02/2014  . 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 Tobacco abuse 10/24/2010  . TIA (transient ischemic attack)   . Hypertension   . Ventricular hypertrophy   . CVA 02/23/2010  . STRESS FRACTURE, FOOT 02/22/2010    Physical Therapy Progress Note  Dates of Reporting Period:  05-10-15 to 11/02/15  Objective Reports of Subjective Statement: gait velocity 2.43 ft/sec  Objective Measurements: see above - pt amb. With [facility] - continues to have LLE weakness which varies depending on fatigue and edema  Goal Update: see above for progress towards goals  Plan: see above for treatment plan  Reason  Skilled Services are Required: Decr. Gait and balance; LLE weakness  Rosette Bellavance, [place], PT 10/20/2015, 8:59 PM [facility] [address] [place], [place], [address] Phone: [phone]   Fax:  [phone]

## 2015-10-29 ENCOUNTER — Ambulatory Visit (INDEPENDENT_AMBULATORY_CARE_PROVIDER_SITE_OTHER): Payer: Medicare Other | Admitting: Cardiology

## 2015-10-29 ENCOUNTER — Encounter: Payer: Self-pay | Admitting: *Deleted

## 2015-10-29 VITALS — BP 124/64 | HR 57 | Ht 61.0 in | Wt 165.0 lb

## 2015-10-29 DIAGNOSIS — I119 Hypertensive heart disease without heart failure: Secondary | ICD-10-CM | POA: Diagnosis not present

## 2015-10-29 DIAGNOSIS — E782 Mixed hyperlipidemia: Secondary | ICD-10-CM | POA: Diagnosis not present

## 2015-10-29 DIAGNOSIS — I2510 Atherosclerotic heart disease of native coronary artery without angina pectoris: Secondary | ICD-10-CM | POA: Diagnosis not present

## 2015-10-29 DIAGNOSIS — Z8673 Personal history of transient ischemic attack (TIA), and cerebral infarction without residual deficits: Secondary | ICD-10-CM | POA: Diagnosis not present

## 2015-10-29 MED ORDER — LISINOPRIL-HYDROCHLOROTHIAZIDE 20-12.5 MG PO TABS: 1.0000 | ORAL_TABLET | Freq: Every day | ORAL | 11 refills | Status: DC

## 2015-10-29 MED ORDER — ATORVASTATIN CALCIUM 40 MG PO TABS: 40.0000 mg | ORAL_TABLET | Freq: Every day | ORAL | 11 refills | Status: DC

## 2015-10-29 MED ORDER — CLOPIDOGREL BISULFATE 75 MG PO TABS: 37.5000 mg | ORAL_TABLET | Freq: Every day | ORAL | Status: DC

## 2015-10-29 MED ORDER — TOPROL XL 50 MG PO TB24: ORAL_TABLET | ORAL | 11 refills | Status: DC

## 2015-10-29 NOTE — Patient Instructions (Signed)

## 2015-10-29 NOTE — Progress Notes (Signed)
Cardiology Office Note    Date:  10/29/2015   ID:  Jeanne Lawson, Jeanne Lawson [birth year], MRN [id number]  PCP:  [name], MD  Cardiologist:   Ena Dawley, MD   Chief complain: 6 months follow-up  History of Present Illness:  Jeanne Lawson is a 70 y.o. female previously followed by Dr. Mare Ferrari, who presents for routine 6 month f/u.  She has a past history of multiple strokes. She's had problems with high blood pressure in the past. She has a history of prior coronary artery disease and 16 years ago Dr. Ilda Foil did cardiac catheterization and angioplasty but no stent. We don't have those records. The patient has had several subsequent Cardiolite stress tests which have been normal but have been poorly tolerated by the patient and she does not want any further nuclear stress tests. She does have a chronically abnormal EKG with anterolateral T wave inversions. She denies any symptoms of angina or dyspnea since her last OV. Unfortunately, she has had recurrent strokes. Her last CVA was in April 2016. 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 This is followed by Dr. [name] and Dr. Oneida Alar.   10/29/2015  - this is her 6 months follow-up, she states that in the last 6 months she hasn't had any episodes of chest pain or shortness of breath, she also denies orthopnea paroxysmal nocturnal dyspnea or syncope. She has been compliant with her meds and follows with her primary care physician on a regular basis, her primary care physician also checks her labs. She denies any muscle pain with atorvastatin. She has had repeat brain CT CTA in September and it showed no changes from prior CT.  Past Medical History:  Diagnosis Date  . COPD (chronic obstructive pulmonary disease) ([place])   . Coronary artery disease 1996   Known with prior mild lesion of LAD demonstrated by Cardiac Catheterization in 1996  . Edema of foot    She has a history of chronic edema of the left  dated back to age 43 when she suufered severe frostbite playing  on the snow as a child  . Hypertension   . PAC (premature atrial contraction)   . Pancreatitis    x2  . PONV (postoperative nausea and vomiting)    problems waking up last time 4/16 only time  . 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  Marland Kitchen Shortness of breath dyspnea    since stroke 2 months ago -  . Stroke 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 TIA (transient ischemic attack)    She was hospitalized 04-23-09 through 04-27-09 for involving right side of the body  . Tobacco abuse    Ongoing   . Ventricular hypertrophy 04/2009   LVH with diastolic dysfunction by echo. Has normal EF.    Past Surgical History:  Procedure Laterality Date  . ABDOMINAL HYSTERECTOMY    . APPENDECTOMY    . CARDIAC CATHETERIZATION  1996   Mild CAD with vasospasm  . CARDIOVASCULAR STRESS TEST  12-02-2001   EF 70%  . ENDARTERECTOMY Right 08/12/2014   Procedure: RIGHT  COMMON CAROTID ARTERY EXPOSURE FOR INTERVENTIONAL RADIOLOGY PROCEDURE BY DR.DEVASHWAR,Insertion 6 FR sheath;  Surgeon: Elam Dutch, MD;  Location: [facility];  Service: Vascular;  Laterality: Right;  . ENDARTERECTOMY Right 08/12/2014   Procedure:  CAROTID  EXPOSURE CLOSURE RIGHT NECK, REPAIR RIGHT COMMON CAROTID ARTERY;  Surgeon: Elam Dutch, MD;  Location: [facility];  Service: Vascular;  Laterality: Right;  . ENDARTERECTOMY Left 11/18/2014   Procedure: CAROTID EXPOSURE;  Surgeon: Elam Dutch, MD;  Location: [facility];  Service: Vascular;  Laterality: Left;  . ENDARTERECTOMY Left 11/18/2014   Procedure: CLOSURE CAROTID;  Surgeon: Elam Dutch, MD;  Location: [facility];  Service: Vascular;  Laterality: Left;  . LAPAROSCOPY    . NECK SURGERY  50 yrs ago   Left side tumor  . RADIOLOGY WITH ANESTHESIA N/A 05/25/2014    Procedure: RADIOLOGY WITH ANESTHESIA;  Surgeon: [name], MD;  Location: [place];  Service: Radiology;  Laterality: N/A;  . RADIOLOGY WITH ANESTHESIA N/A 08/12/2014   Procedure: RADIOLOGY WITH ANESTHESIA;  Surgeon: [name], MD;  Location: [facility];  Service: Radiology;  Laterality: N/A;  . RADIOLOGY WITH ANESTHESIA N/A 03/15/2015   Procedure: MRI OF BRAIN WITHOUT CONTRAST;  Surgeon: Medication Radiologist, MD;  Location: [facility];  Service: Radiology;  Laterality: N/A;  DR. TAT/MRI  . US ECHOCARDIOGRAPHY  04-26-2009   EF 65-70%  . VESICOVAGINAL FISTULA CLOSURE W/ TAH  25 yrs ago    Current Medications: Outpatient Medications Prior to Visit  Medication Sig Dispense Refill  . albuterol (PROVENTIL HFA;VENTOLIN HFA) 108 (90 BASE) MCG/ACT inhaler Inhale 1-2 puffs into the lungs every 6 (six) hours as needed for wheezing or shortness of breath.    Marland Kitchen aspirin 81 MG tablet Take 81 mg by mouth daily.    Marland Kitchen atorvastatin (LIPITOR) 40 MG tablet Take 1 tablet (40 mg total) by mouth daily at 6 PM. 30 tablet 3  . clopidogrel (PLAVIX) 75 MG tablet Take 0.5 tablets (37.5 mg total) by mouth daily.    Marland Kitchen lisinopril-hydrochlorothiazide (PRINZIDE,ZESTORETIC) 20-12.5 MG tablet Take 1 tablet by mouth daily. 90 tablet 0  . TOPROL XL 50 MG 24 hr tablet Take 25 mg by mouth twice a day with or immediately following a meal. 90 tablet 1   No facility-administered medications prior to visit.      Allergies:   Dilaudid [hydromorphone]; Codeine; Hydromorphone hcl; Latex; and Sulfa drugs cross reactors   Social History   Social History  . Marital status: Widowed    Spouse name: N/A  . Number of children: N/A  . Years of education: N/A   Social History Main Topics  . Smoking status: Former Smoker    Packs/day: 1.00    Years: 30.00    Types: Cigarettes    Quit date: 04/26/2014  . Smokeless tobacco: Never Used  . Alcohol use No  . Drug use: No  . Sexual activity: Not Asked   Other Topics Concern  .  None   Social History Narrative  . None     Family History:  The patient's family history includes Aneurysm in her sister; Diabetes in her father; Emphysema in her father; Heart disease in her mother; Hypertension in her daughter and mother; Stroke in her mother; Thyroid disease in her daughter.   ROS:   Please see the history of present illness.    ROS All other systems reviewed and are negative.   PHYSICAL EXAM:   VS:  BP 124/64   Pulse (!) 57   Ht 5\' 1"  (1.549 m)   Wt 165 lb (74.8 kg)   BMI 31.18 kg/m    GEN: Well nourished, well developed,  in no acute distress  HEENT: normal  Neck: no JVD, carotid bruits, or masses Cardiac: RRR; no murmurs, rubs, or gallops,no edema  Respiratory:  clear to auscultation bilaterally, normal work of breathing GI: soft, nontender, nondistended, + BS MS: no deformity or atrophy  Skin: warm and dry, no rash Neuro:  Alert and Oriented x 3, Strength and sensation are intact Psych: euthymic mood, full affect  Wt Readings from Last 3 Encounters:  10/29/15 165 lb (74.8 kg)  05/31/15 161 lb (73 kg)  03/13/15 159 lb 1.6 oz (72.2 kg)    Studies/Labs Reviewed:   EKG:  EKG is ordered today.  The ekg ordered today demonstrates Sinus bradycardia, possible prior anterior infarct age undetermined, no change from prior.  Recent Labs: 03/14/2015: ALT 12 03/15/2015: BUN 8; Magnesium 2.0; Potassium 3.7; Sodium 141 03/17/2015: Hemoglobin 10.8; Platelets 102 10/11/2015: Creatinine, Ser 0.80   Lipid Panel    Component Value Date/Time   CHOL 158 03/13/2015 0530   TRIG 145 03/13/2015 0530   HDL 66 03/13/2015 0530   CHOLHDL 2.4 03/13/2015 0530   VLDL 29 03/13/2015 0530   LDLCALC 63 03/13/2015 0530   02/2015  - Left ventricle: The cavity size was normal. Wall thickness was   increased in a pattern of moderate LVH. Systolic function was   normal. The estimated ejection fraction was in the range of 60%   to 65%. Wall motion was normal; there were no  regional wall   motion abnormalities. Doppler parameters are consistent with   abnormal left ventricular relaxation (grade 1 diastolic   dysfunction).    ASSESSMENT:    1. Coronary artery disease involving native coronary artery of native heart without angina pectoris   2. Mixed hyperlipidemia   3. Hypertensive heart disease without CHF   4. History of cardioembolic cerebrovascular accident (CVA)      PLAN:  In order of problems listed above:  She currently doesn't need any ischemic workup as she is asymptomatic, her blood pressure is well controlled she's in high dose of Lipitor at, laps followed by her primary care physician. I would continue aspirin, Plavix, lisinopril, atorvastatin and metoprolol. She quit smoking earlier this year.   Medication Adjustments/Labs and Tests Ordered: Current medicines are reviewed at length with the patient today.  Concerns regarding medicines are outlined above.  Medication changes, Labs and Tests ordered today are listed in the Patient Instructions below. Patient Instructions  Medication Instructions:   Your physician recommends that you continue on your current medications as directed. Please refer to the Current Medication list given to you today.    Follow-Up:  Your physician wants you to follow-up in: [place] will receive a reminder letter in the mail two months in advance. If you don't receive a letter, please call our office to schedule the follow-up appointment.      If you need a refill on your cardiac medications before your next appointment, please call your pharmacy.      Signed, Ena Dawley, MD  10/29/2015 1:27 PM    [facility] [place], [place], [place]  [address] Phone: [phone]; Fax: [phone]

## 2015-11-02 ENCOUNTER — Ambulatory Visit: Payer: Medicare Other | Attending: Internal Medicine | Admitting: Physical Therapy

## 2015-11-02 DIAGNOSIS — R2689 Other abnormalities of gait and mobility: Secondary | ICD-10-CM | POA: Diagnosis not present

## 2015-11-02 DIAGNOSIS — M6281 Muscle weakness (generalized): Secondary | ICD-10-CM | POA: Insufficient documentation

## 2015-11-02 NOTE — Therapy (Signed)
[facility] [address] [place], [place], [address] Phone: [phone]   Fax:  [phone]  Physical Therapy Treatment  Patient Details  Name: Jeanne Lawson MRN: [id number] Date of Birth: 1945/12/06 Referring Provider: Dr. [name]  Encounter Date: 11/02/2015      PT End of Session - 11/02/15 1039    Visit Number 11  G2   Number of Visits 14   Date for PT Re-Evaluation 12/03/15   Authorization Type U[employer] Medicare   Authorization Time Period 11-02-15 - 01-01-16   PT Start Time 0802   PT Stop Time 0848   PT Time Calculation (min) 46 min      Past Medical History:  Diagnosis Date  . COPD (chronic obstructive pulmonary disease) ([place])   . Coronary artery disease 1996   Known with prior mild lesion of LAD demonstrated by Cardiac Catheterization in 1996  . Edema of foot    She has a history of chronic edema of the left dated back to age 17 when she suufered severe frostbite playing  on the snow as a child  . Hypertension   . PAC (premature atrial contraction)   . Pancreatitis    x2  . PONV (postoperative nausea and vomiting)    problems waking up last time 4/16 only time  . 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  Marland Kitchen Shortness of breath dyspnea    since stroke 2 months ago -  . 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 TIA (transient ischemic attack)    She was hospitalized 04-23-09 through 04-27-09 for involving right side of the body  . Tobacco abuse    Ongoing   . Ventricular hypertrophy 04/2009   LVH with diastolic dysfunction by echo. Has normal EF.    Past Surgical History:  Procedure Laterality Date  . ABDOMINAL HYSTERECTOMY    . APPENDECTOMY    . CARDIAC CATHETERIZATION  1996   Mild CAD with vasospasm  .  CARDIOVASCULAR STRESS TEST  12-02-2001   EF 70%  . ENDARTERECTOMY Right 08/12/2014   Procedure: RIGHT  COMMON CAROTID ARTERY EXPOSURE FOR INTERVENTIONAL RADIOLOGY PROCEDURE BY DR.DEVASHWAR,Insertion 6 FR sheath;  Surgeon: Elam Dutch, MD;  Location: [facility];  Service: Vascular;  Laterality: Right;  . ENDARTERECTOMY Right 08/12/2014   Procedure:  CAROTID  EXPOSURE CLOSURE RIGHT NECK, REPAIR RIGHT COMMON CAROTID ARTERY;  Surgeon: Elam Dutch, MD;  Location: [facility];  Service: Vascular;  Laterality: Right;  . ENDARTERECTOMY Left 11/18/2014   Procedure: CAROTID EXPOSURE;  Surgeon: Elam Dutch, MD;  Location: [facility];  Service: Vascular;  Laterality: Left;  . ENDARTERECTOMY Left 11/18/2014   Procedure: CLOSURE CAROTID;  Surgeon: Elam Dutch, MD;  Location: [facility];  Service: Vascular;  Laterality: Left;  . LAPAROSCOPY    . NECK SURGERY  50 yrs ago   Left side tumor  . RADIOLOGY WITH ANESTHESIA N/A 05/25/2014   Procedure: RADIOLOGY WITH ANESTHESIA;  Surgeon: [name], MD;  Location: [facility];  Service: Radiology;  Laterality: N/A;  . RADIOLOGY WITH ANESTHESIA N/A 08/12/2014   Procedure: RADIOLOGY WITH ANESTHESIA;  Surgeon: [name], MD;  Location: [facility];  Service: Radiology;  Laterality: N/A;  . RADIOLOGY WITH ANESTHESIA N/A 03/15/2015  Procedure: MRI OF BRAIN WITHOUT CONTRAST;  Surgeon: Medication Radiologist, MD;  Location: [facility];  Service: Radiology;  Laterality: N/A;  DR. TAT/MRI  . US ECHOCARDIOGRAPHY  04-26-2009   EF 65-70%  . VESICOVAGINAL FISTULA CLOSURE W/ TAH  25 yrs ago    There were no vitals filed for this visit.      Subjective Assessment - 11/02/15 1027    Subjective Pt reports she saw her cardiologist yesterday - got a good report - says BP was a little on low side but that was good; states her cardiologist told her to continue PT if she could   Pertinent History h/o old R CVA in 2010; TIA 04-23-09 - 04-27-09:  CAD:  COPD:  chronic edema in L foot dated back to  age 9: saccular aneurysm, paroxysmal atrial fibrillation, known cerebral aneurysms by MRA in 2011; ventricular hypertrophy:  HTN   Diagnostic tests MRI on 03-17-15; previous MRI's due to previous CVA   Patient Stated Goals increase LLE strength and walk better   Currently in Pain? No/denies                         [facility] Adult PT Treatment/Exercise - 11/02/15 1028      Ambulation/Gait   Ambulation/Gait Yes   Ambulation/Gait Assistance 5: Supervision   Ambulation/Gait Assistance Details 6" walk test   Ambulation Distance (Feet) 600 Feet   Assistive device Straight cane   Gait Pattern Decreased dorsiflexion - left;Poor foot clearance - left;Decreased step length - left;Decreased hip/knee flexion - left;Left foot flat   Ambulation Surface Level;Indoor     Lumbar Exercises: Stretches   Active Hamstring Stretch 1 rep;30 seconds  both legs     Lumbar Exercises: Aerobic   Stationary Bike SciFit level 1.5 x 5"     Lumbar Exercises: Standing   Heel Raises 10 reps  LLE only 10 reps     Knee/Hip Exercises: Stretches   Gastroc Stretch Left;1 rep;30 seconds   Other Knee/Hip Stretches Runner's stretch - LLE - 30 sec hold in standing      Pt performed amb. Forward/backward inside // bars 10' x 2 reps each - on tip toes with knees flexed for plantarflexor and quad strengthening Amb. Laterally 2 reps inside bars - with cues for big side step with knees flexed on tip toes  TherEx:  Standing bil. LE hip flexor, abduction and extension with red theraband 10 reps each             PT Short Term Goals - 06/29/15 0855      PT SHORT TERM GOAL #1   Title Increase gait velocity to >/= 2.60 ft/sec with cane for incr. gait efficiency.  (06-09-15)   Baseline 2.43 === 13.5 secs   Status Not Met     PT SHORT TERM GOAL #2   Title Pt will report at least 25% improvement in ambulation with incr. ease with L foot clearance.  (06-09-15)   Baseline fluctuates based on edema- 06-29-15    Status Partially Met     PT SHORT TERM GOAL #3   Title Incr. amb. distance to at least 300' in 2" 10 secs  (06-07-15)   Baseline 360' with cane  - 06-29-15   Status Achieved     PT SHORT TERM GOAL #5   Title Independent in HEP for  LLE strengthening exercises.  (06-09-15)   Status Achieved  PT Long Term Goals - 11/02/15 1049      PT LONG TERM GOAL #1   Title Independent in updated HEP for LLE strengthening and stretching (07-09-15)   Baseline Extend LTG's til 10-12-15 -- met 11-02-15   Status Achieved     PT LONG TERM GOAL #2   Title Report at least 50% improvement in LLE flexibility and ease of movement (6-16--17)/ 11-06-15   Baseline remains inconsistently met -  11-02-15   Time 4   Period Weeks   Status On-going     PT LONG TERM GOAL #4   Title Incr. gait velocity to >/= 2.9 ft/sec with cane for incr. gait efficiency.  (07-09-15)/ 11-06-15   Baseline 2.43 ft/sec with cane   Time 4   Period Weeks   Status On-going     PT LONG TERM GOAL #5   Title Amb. 6" nonstop with cane to demo incr. endurance/activity tolerance.  (07-09-15)/ 10-12-15/ 11-06-15   Baseline (200' x 3 = 600') nonstop with cane = met 11-02-15   Time 4   Period Weeks   Status Achieved     PT LONG TERM GOAL #7   Title Perform SLS on LLE >/= 5 secs to demo improved balance.  (12-03-15)   Baseline 3.47 secs   Time 4   Period Weeks   Status New     PT LONG TERM GOAL #8   Title Transfer floor to stand with UE support with SBA/verbal cues.  (12-03-15)   Time 4   Period Weeks   Status New               Plan - 11/02/15 1041    Clinical Impression Statement Pt demonstrating increased endurance with ability to amb. 6" nonstop today: pt continues to have edema in LLE which impacts flexibility and foot clearance in swing phase of gait; pt would benefit from additional 4 weeks of PT   Rehab Potential Good   PT Frequency 1x / week   PT Duration 4 weeks   PT Treatment/Interventions ADLs/Self  Care Home Management;Therapeutic exercise;Therapeutic activities;Functional mobility training;Stair training;Gait training;DME Instruction;Balance training;Neuromuscular re-education;Patient/family education;Orthotic Fit/Training;Passive range of motion   PT Next Visit Plan cont ther ex   PT Home Exercise Plan Stretches and strengthening for LLE   Consulted and Agree with Plan of Care Patient      Patient will benefit from skilled therapeutic intervention in order to improve the following deficits and impairments:  Abnormal gait, Cardiopulmonary status limiting activity, Decreased activity tolerance, Decreased balance, Decreased mobility, Decreased strength, Decreased knowledge of use of DME, Decreased endurance, Decreased coordination, Decreased range of motion, Increased edema, Impaired flexibility  Visit Diagnosis: Other abnormalities of gait and mobility - Plan: PT plan of care cert/re-cert  Muscle weakness (generalized) - Plan: PT plan of care cert/re-cert     Problem List Patient Active Problem List   Diagnosis Date Noted  . 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 Aneurysm, cerebral, nonruptured 05/31/2015  . Dizziness and giddiness 05/31/2015  . Altered mental status   . Acute CVA (cerebrovascular accident) ([place]) 03/15/2015  . Dysphasia 03/12/2015  . Thrombocytopenia ([place]) 03/12/2015  . Hypokalemia   . Endotracheally intubated   . Essential hypertension   . Intracranial aneurysm 11/18/2014  . Respiratory failure ([place])   . Cerebral aneurysm   . Brain aneurysm   . Aphasia   . Palmar erythema   . CVA (cerebral infarction) 05/02/2014  . Aneurysm ([place]) 05/02/2014  . Expressive aphasia 05/02/2014  .  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 Tobacco abuse 10/24/2010  . TIA (transient ischemic attack)   . Hypertension   . Ventricular hypertrophy   . CVA 02/23/2010  . STRESS FRACTURE, FOOT 02/22/2010    Rahmon Heigl, [place], PT 11/02/2015, 10:59 AM  [facility] [address] [place], [place], [address] Phone: [phone]   Fax:  [phone]  Name: Jeanne Lawson MRN: [id number] Date of Birth: 1945-05-08

## 2015-11-08 ENCOUNTER — Ambulatory Visit: Payer: Medicare Other | Admitting: Physical Therapy

## 2015-11-08 DIAGNOSIS — R2689 Other abnormalities of gait and mobility: Secondary | ICD-10-CM

## 2015-11-08 DIAGNOSIS — M6281 Muscle weakness (generalized): Secondary | ICD-10-CM

## 2015-11-08 NOTE — Therapy (Signed)
[facility] [address] [place], [place], [address] Phone: [phone]   Fax:  [phone]  Physical Therapy Treatment  Patient Details  Name: Jeanne Lawson MRN: [id number] Date of Birth: May 07, 1945 Referring Provider: Dr. [name]  Encounter Date: 11/08/2015      PT End of Session - 11/08/15 1957    Visit Number 12   Number of Visits 14   Date for PT Re-Evaluation 12/03/15   Authorization Type UHC Medicare   Authorization Time Period 11-02-15 - 01-01-16   PT Start Time 0846   PT Stop Time 0930   PT Time Calculation (min) 44 min      Past Medical History:  Diagnosis Date  . COPD (chronic obstructive pulmonary disease) ([place])   . Coronary artery disease 1996   Known with prior mild lesion of LAD demonstrated by Cardiac Catheterization in 1996  . Edema of foot    She has a history of chronic edema of the left dated back to age 70 when she suufered severe frostbite playing  on the snow as a child  . Hypertension   . PAC (premature atrial contraction)   . Pancreatitis    x2  . PONV (postoperative nausea and vomiting)    problems waking up last time 4/16 only time  . 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  Marland Kitchen Shortness of breath dyspnea    since stroke 2 months ago -  . 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 TIA (transient ischemic attack)    She was hospitalized 04-23-09 through 04-27-09 for involving right side of the body  . Tobacco abuse    Ongoing   . Ventricular hypertrophy 04/2009   LVH with diastolic dysfunction by echo. Has normal EF.    Past Surgical History:  Procedure Laterality Date  . ABDOMINAL HYSTERECTOMY    . APPENDECTOMY    . CARDIAC CATHETERIZATION  1996   Mild CAD with vasospasm  . CARDIOVASCULAR  STRESS TEST  12-02-2001   EF 70%  . ENDARTERECTOMY Right 08/12/2014   Procedure: RIGHT  COMMON CAROTID ARTERY EXPOSURE FOR INTERVENTIONAL RADIOLOGY PROCEDURE BY DR.DEVASHWAR,Insertion 6 FR sheath;  Surgeon: Elam Dutch, MD;  Location: [facility];  Service: Vascular;  Laterality: Right;  . ENDARTERECTOMY Right 08/12/2014   Procedure:  CAROTID  EXPOSURE CLOSURE RIGHT NECK, REPAIR RIGHT COMMON CAROTID ARTERY;  Surgeon: Elam Dutch, MD;  Location: [facility];  Service: Vascular;  Laterality: Right;  . ENDARTERECTOMY Left 11/18/2014   Procedure: CAROTID EXPOSURE;  Surgeon: Elam Dutch, MD;  Location: [facility];  Service: Vascular;  Laterality: Left;  . ENDARTERECTOMY Left 11/18/2014   Procedure: CLOSURE CAROTID;  Surgeon: Elam Dutch, MD;  Location: [facility];  Service: Vascular;  Laterality: Left;  . LAPAROSCOPY    . NECK SURGERY  50 yrs ago   Left side tumor  . RADIOLOGY WITH ANESTHESIA N/A 05/25/2014   Procedure: RADIOLOGY WITH ANESTHESIA;  Surgeon: [name], MD;  Location: [facility];  Service: Radiology;  Laterality: N/A;  . RADIOLOGY WITH ANESTHESIA N/A 08/12/2014   Procedure: RADIOLOGY WITH ANESTHESIA;  Surgeon: [name], MD;  Location: [facility] [place];  Service: Radiology;  Laterality: N/A;  . RADIOLOGY WITH ANESTHESIA N/A 03/15/2015   Procedure: MRI  OF BRAIN WITHOUT CONTRAST;  Surgeon: Medication Radiologist, MD;  Location: [facility];  Service: Radiology;  Laterality: N/A;  DR. TAT/MRI  . US ECHOCARDIOGRAPHY  04-26-2009   EF 65-70%  . VESICOVAGINAL FISTULA CLOSURE W/ TAH  25 yrs ago    There were no vitals filed for this visit.      Subjective Assessment - 11/08/15 1948    Subjective Pt reports she is doing well - had a near fall at restaurant last Friday but was able to catch herself to prevent fall   Pertinent History h/o old R CVA in 2010; TIA 04-23-09 - 04-27-09:  CAD:  COPD:  chronic edema in L foot dated back to age 70: saccular aneurysm, paroxysmal atrial fibrillation, known cerebral  aneurysms by MRA in 2011; ventricular hypertrophy:  HTN   How long can you stand comfortably? q   How long can you walk comfortably? Q11   Diagnostic tests MRI on 03-17-15; previous MRI's due to previous CVA   Patient Stated Goals increase LLE strength and walk better   Currently in Pain? No/denies                         [facility] Adult PT Treatment/Exercise - 11/08/15 0001      Lumbar Exercises: Standing   Heel Raises 10 reps  LLE only 10 reps   Other Standing Lumbar Exercises Pt performed standing hip extension, abduction and flexion with red theraband  x 10 reps each leg     Knee/Hip Exercises: Stretches   Other Knee/Hip Stretches Runner's stretch - LLE - 30 sec hold in standing     Knee/Hip Exercises: Aerobic   Recumbent Bike Scifit level 1.5 x 5" with UE's and LE's     Knee/Hip Exercises: Machines for Strengthening   Cybex Leg Press 40# bil. LE's 10 reps:  LLE only 25# 2 sets 10 reps     Knee/Hip Exercises: Standing   Heel Raises Both;10 reps   Forward Step Up Left;1 set;Step Height: 6";10 reps   Step Down Left;1 set;10 reps      Standing L knee flexion with 3# 2 sets 10 reps Sidestepping 10' along counter with red theraband  2 reps for hip strengthening   NeuroRe-ed:   Stepping over and back of balance beam - 10 reps each leg   Amb. On tiptoes with knees flexed  --  forward and backward along counter 2 reps each direction with SBA          PT Short Term Goals - 06/29/15 0855      PT SHORT TERM GOAL #1   Title Increase gait velocity to >/= 2.60 ft/sec with cane for incr. gait efficiency.  (06-09-15)   Baseline 2.43 === 13.5 secs   Status Not Met     PT SHORT TERM GOAL #2   Title Pt will report at least 25% improvement in ambulation with incr. ease with L foot clearance.  (06-09-15)   Baseline fluctuates based on edema- 06-29-15   Status Partially Met     PT SHORT TERM GOAL #3   Title Incr. amb. distance to at least 300' in 2" 10 secs  (06-07-15)    Baseline 360' with cane  - 06-29-15   Status Achieved     PT SHORT TERM GOAL #5   Title Independent in HEP for  LLE strengthening exercises.  (06-09-15)   Status Achieved           PT  Long Term Goals - 11/02/15 1049      PT LONG TERM GOAL #1   Title Independent in updated HEP for LLE strengthening and stretching (07-09-15)   Baseline Extend LTG's til 10-12-15 -- met 11-02-15   Status Achieved     PT LONG TERM GOAL #2   Title Report at least 50% improvement in LLE flexibility and ease of movement (6-16--17)/ 11-06-15   Baseline remains inconsistently met -  11-02-15   Time 4   Period Weeks   Status On-going     PT LONG TERM GOAL #4   Title Incr. gait velocity to >/= 2.9 ft/sec with cane for incr. gait efficiency.  (07-09-15)/ 11-06-15   Baseline 2.43 ft/sec with cane   Time 4   Period Weeks   Status On-going     PT LONG TERM GOAL #5   Title Amb. 6" nonstop with cane to demo incr. endurance/activity tolerance.  (07-09-15)/ 10-12-15/ 11-06-15   Baseline (200' x 3 = 600') nonstop with cane = met 11-02-15   Time 4   Period Weeks   Status Achieved     PT LONG TERM GOAL #7   Title Perform SLS on LLE >/= 5 secs to demo improved balance.  (12-03-15)   Baseline 3.47 secs   Time 4   Period Weeks   Status New     PT LONG TERM GOAL #8   Title Transfer floor to stand with UE support with SBA/verbal cues.  (12-03-15)   Time 4   Period Weeks   Status New               Plan - 11/08/15 1957    Clinical Impression Statement Pt progressing with increasing LLE strength - increased from 30# to 40# on leg press; continues to scuff L foot occasionally due to increased edema and dorsiflexor weakness   Rehab Potential Good   PT Frequency 1x / week   PT Duration 4 weeks   PT Treatment/Interventions ADLs/Self Care Home Management;Therapeutic exercise;Therapeutic activities;Functional mobility training;Stair training;Gait training;DME Instruction;Balance training;Neuromuscular  re-education;Patient/family education;Orthotic Fit/Training;Passive range of motion   PT Next Visit Plan cont ther ex   PT Home Exercise Plan Stretches and strengthening for LLE   Consulted and Agree with Plan of Care Patient      Patient will benefit from skilled therapeutic intervention in order to improve the following deficits and impairments:  Abnormal gait, Cardiopulmonary status limiting activity, Decreased activity tolerance, Decreased balance, Decreased mobility, Decreased strength, Decreased knowledge of use of DME, Decreased endurance, Decreased coordination, Decreased range of motion, Increased edema, Impaired flexibility  Visit Diagnosis: Other abnormalities of gait and mobility  Muscle weakness (generalized)     Problem List Patient Active Problem List   Diagnosis Date Noted  . 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 Aneurysm, cerebral, nonruptured 05/31/2015  . Dizziness and giddiness 05/31/2015  . Altered mental status   . Acute CVA (cerebrovascular accident) ([place]) 03/15/2015  . Dysphasia 03/12/2015  . Thrombocytopenia ([place]) 03/12/2015  . Hypokalemia   . Endotracheally intubated   . Essential hypertension   . Intracranial aneurysm 11/18/2014  . Respiratory failure ([place])   . Cerebral aneurysm   . Brain aneurysm   . Aphasia   . Palmar erythema   . CVA (cerebral infarction) 05/02/2014  . Aneurysm ([place]) 05/02/2014  . Expressive aphasia 05/02/2014  . 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 Tobacco abuse 10/24/2010  . TIA (transient ischemic attack)   .  Hypertension   . Ventricular hypertrophy   . CVA 02/23/2010  . STRESS FRACTURE, FOOT 02/22/2010    Alda Lea, PT 11/08/2015, 8:02 PM  [facility] [address] [place], [place], [address] Phone: [phone]   Fax:  [phone]  Name: CHRYSTIAN RESSLER MRN: [id number] Date of Birth:  Dec 28, 1945

## 2015-11-16 ENCOUNTER — Ambulatory Visit: Payer: Medicare Other | Admitting: Physical Therapy

## 2015-11-16 DIAGNOSIS — M6281 Muscle weakness (generalized): Secondary | ICD-10-CM

## 2015-11-16 DIAGNOSIS — R2689 Other abnormalities of gait and mobility: Secondary | ICD-10-CM | POA: Diagnosis not present

## 2015-11-17 NOTE — Therapy (Signed)
[facility] [address] [place], [place], [address] Phone: [phone]   Fax:  [phone]  Physical Therapy Treatment  Patient Details  Name: Jeanne Lawson MRN: [id number] Date of Birth: Mar 13, 1945 Referring Provider: Dr. [name]  Encounter Date: 11/16/2015      PT End of Session - 11/17/15 0937    Visit Number 13   Number of Visits 14   Date for PT Re-Evaluation 12/03/15   Authorization Type UHC Medicare   Authorization Time Period 11-02-15 - 01-01-16   PT Start Time 0801   PT Stop Time 0846   PT Time Calculation (min) 45 min      Past Medical History:  Diagnosis Date  . COPD (chronic obstructive pulmonary disease) ([place])   . Coronary artery disease 1996   Known with prior mild lesion of LAD demonstrated by Cardiac Catheterization in 1996  . Edema of foot    She has a history of chronic edema of the left dated back to age 70 when she suufered severe frostbite playing  on the snow as a child  . Hypertension   . PAC (premature atrial contraction)   . Pancreatitis    x2  . PONV (postoperative nausea and vomiting)    problems waking up last time 4/16 only time  . 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  Marland Kitchen Shortness of breath dyspnea    since stroke 2 months ago -  . 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 TIA (transient ischemic attack)    She was hospitalized 04-23-09 through 04-27-09 for involving right side of the body  . Tobacco abuse    Ongoing   . Ventricular hypertrophy 04/2009   LVH with diastolic dysfunction by echo. Has normal EF.    Past Surgical History:  Procedure Laterality Date  . ABDOMINAL HYSTERECTOMY    . APPENDECTOMY    . CARDIAC CATHETERIZATION  1996   Mild CAD with vasospasm  . CARDIOVASCULAR  STRESS TEST  12-02-2001   EF 70%  . ENDARTERECTOMY Right 08/12/2014   Procedure: RIGHT  COMMON CAROTID ARTERY EXPOSURE FOR INTERVENTIONAL RADIOLOGY PROCEDURE BY DR.DEVASHWAR,Insertion 6 FR sheath;  Surgeon: Elam Dutch, MD;  Location: [facility];  Service: Vascular;  Laterality: Right;  . ENDARTERECTOMY Right 08/12/2014   Procedure:  CAROTID  EXPOSURE CLOSURE RIGHT NECK, REPAIR RIGHT COMMON CAROTID ARTERY;  Surgeon: Elam Dutch, MD;  Location: [facility];  Service: Vascular;  Laterality: Right;  . ENDARTERECTOMY Left 11/18/2014   Procedure: CAROTID EXPOSURE;  Surgeon: Elam Dutch, MD;  Location: [facility];  Service: Vascular;  Laterality: Left;  . ENDARTERECTOMY Left 11/18/2014   Procedure: CLOSURE CAROTID;  Surgeon: Elam Dutch, MD;  Location: [facility];  Service: Vascular;  Laterality: Left;  . LAPAROSCOPY    . NECK SURGERY  50 yrs ago   Left side tumor  . RADIOLOGY WITH ANESTHESIA N/A 05/25/2014   Procedure: RADIOLOGY WITH ANESTHESIA;  Surgeon: [name], MD;  Location: [facility];  Service: Radiology;  Laterality: N/A;  . RADIOLOGY WITH ANESTHESIA N/A 08/12/2014   Procedure: RADIOLOGY WITH ANESTHESIA;  Surgeon: [name], MD;  Location: [facility];  Service: Radiology;  Laterality: N/A;  . RADIOLOGY WITH ANESTHESIA N/A 03/15/2015   Procedure: MRI  OF BRAIN WITHOUT CONTRAST;  Surgeon: Medication Radiologist, MD;  Location: [facility];  Service: Radiology;  Laterality: N/A;  DR. TAT/MRI  . US ECHOCARDIOGRAPHY  04-26-2009   EF 65-70%  . VESICOVAGINAL FISTULA CLOSURE W/ TAH  25 yrs ago    There were no vitals filed for this visit.      Subjective Assessment - 11/17/15 0934    Subjective Pt reports she has been busy doing things around her house - has appt with Dr. [name] next week   Pertinent History h/o old R CVA in 2010; TIA 04-23-09 - 04-27-09:  CAD:  COPD:  chronic edema in L foot dated back to age 70: saccular aneurysm, paroxysmal atrial fibrillation, known cerebral aneurysms by MRA in 2011;  ventricular hypertrophy:  HTN   Diagnostic tests MRI on 03-17-15; previous MRI's due to previous CVA   Patient Stated Goals increase LLE strength and walk better   Currently in Pain? No/denies                         [facility] Adult PT Treatment/Exercise - 11/17/15 0001      Lumbar Exercises: Stretches   Active Hamstring Stretch 1 rep;30 seconds  LLE     Lumbar Exercises: Aerobic   Stationary Bike SciFit level 1.5 x 5"     Lumbar Exercises: Standing   Heel Raises 10 reps  LLE only 10 reps   Functional Squats 10 reps  on BOSU     Knee/Hip Exercises: Stretches   Gastroc Stretch Left;1 rep;30 seconds     Knee/Hip Exercises: Machines for Strengthening   Cybex Leg Press 40# bil. LE's 10 reps:  LLE only 25# 2 sets 10 reps     Knee/Hip Exercises: Standing   Forward Step Up Left;1 set;10 reps;Step Height: 6"   Step Down Left;1 set;10 reps;Step Height: 6"      Neuro Re-ed:  Pt performed amb. Forward/backward inside // bars 10' x 2 reps each - on tip toes with knees flexed for plantarflexor and quad strengthening Amb. Laterally 2 reps inside bars - with cues for big side step with knees flexed on tip toes  stepping over and back of balance beam 10 reps each leg with UE support prn  Standing on Bosu - inside bars - moving RLE up/back and laterally for improved L SLS and for strengthening             PT Short Term Goals - 06/29/15 0855      PT SHORT TERM GOAL #1   Title Increase gait velocity to >/= 2.60 ft/sec with cane for incr. gait efficiency.  (06-09-15)   Baseline 2.43 === 13.5 secs   Status Not Met     PT SHORT TERM GOAL #2   Title Pt will report at least 25% improvement in ambulation with incr. ease with L foot clearance.  (06-09-15)   Baseline fluctuates based on edema- 06-29-15   Status Partially Met     PT SHORT TERM GOAL #3   Title Incr. amb. distance to at least 300' in 2" 10 secs  (06-07-15)   Baseline 360' with cane  - 06-29-15   Status Achieved      PT SHORT TERM GOAL #5   Title Independent in HEP for  LLE strengthening exercises.  (06-09-15)   Status Achieved           PT Long Term Goals - 11/02/15 1049      PT LONG  TERM GOAL #1   Title Independent in updated HEP for LLE strengthening and stretching (07-09-15)   Baseline Extend LTG's til 10-12-15 -- met 11-02-15   Status Achieved     PT LONG TERM GOAL #2   Title Report at least 50% improvement in LLE flexibility and ease of movement (6-16--17)/ 11-06-15   Baseline remains inconsistently met -  11-02-15   Time 4   Period Weeks   Status On-going     PT LONG TERM GOAL #4   Title Incr. gait velocity to >/= 2.9 ft/sec with cane for incr. gait efficiency.  (07-09-15)/ 11-06-15   Baseline 2.43 ft/sec with cane   Time 4   Period Weeks   Status On-going     PT LONG TERM GOAL #5   Title Amb. 6" nonstop with cane to demo incr. endurance/activity tolerance.  (07-09-15)/ 10-12-15/ 11-06-15   Baseline (200' x 3 = 600') nonstop with cane = met 11-02-15   Time 4   Period Weeks   Status Achieved     PT LONG TERM GOAL #7   Title Perform SLS on LLE >/= 5 secs to demo improved balance.  (12-03-15)   Baseline 3.47 secs   Time 4   Period Weeks   Status New     PT LONG TERM GOAL #8   Title Transfer floor to stand with UE support with SBA/verbal cues.  (12-03-15)   Time 4   Period Weeks   Status New               Plan - 11/17/15 3428    Clinical Impression Statement Pt has moderate edema in L lower leg, ankle and L foot which limits dorsiflexion which decreases L foot clearance in swing phase of gait - this results in L foot scuffing floor; pt able to increase clearnace with verbal cues to lift LLE higher in swing phase of gait   Rehab Potential Good   PT Frequency 1x / week   PT Duration 4 weeks   PT Treatment/Interventions ADLs/Self Care Home Management;Therapeutic exercise;Therapeutic activities;Functional mobility training;Stair training;Gait training;DME  Instruction;Balance training;Neuromuscular re-education;Patient/family education;Orthotic Fit/Training;Passive range of motion   PT Next Visit Plan cont ther ex   PT Home Exercise Plan Stretches and strengthening for LLE   Consulted and Agree with Plan of Care Patient      Patient will benefit from skilled therapeutic intervention in order to improve the following deficits and impairments:  Abnormal gait, Cardiopulmonary status limiting activity, Decreased activity tolerance, Decreased balance, Decreased mobility, Decreased strength, Decreased knowledge of use of DME, Decreased endurance, Decreased coordination, Decreased range of motion, Increased edema, Impaired flexibility  Visit Diagnosis: Other abnormalities of gait and mobility  Muscle weakness (generalized)     Problem List Patient Active Problem List   Diagnosis Date Noted  . 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 Aneurysm, cerebral, nonruptured 05/31/2015  . Dizziness and giddiness 05/31/2015  . Altered mental status   . Acute CVA (cerebrovascular accident) ([facility]) 03/15/2015  . Dysphasia 03/12/2015  . Thrombocytopenia ([place]) 03/12/2015  . Hypokalemia   . Endotracheally intubated   . Essential hypertension   . Intracranial aneurysm 11/18/2014  . Respiratory failure ([place])   . Cerebral aneurysm   . Brain aneurysm   . Aphasia   . Palmar erythema   . CVA (cerebral infarction) 05/02/2014  . Aneurysm ([facility]) 05/02/2014  . Expressive aphasia 05/02/2014  . 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 Tobacco abuse  10/24/2010  . TIA (transient ischemic attack)   . Hypertension   . Ventricular hypertrophy   . CVA 02/23/2010  . STRESS FRACTURE, FOOT 02/22/2010    Alda Lea, PT 11/17/2015, 9:42 AM  [facility] [address] [place], [place], [address] Phone: [phone]   Fax:  [phone]  Name:  LLOYD CULLINAN MRN: [id number] Date of Birth: [birth year]

## 2015-11-23 ENCOUNTER — Ambulatory Visit: Payer: Medicare Other | Admitting: Physical Therapy

## 2015-11-23 DIAGNOSIS — R2689 Other abnormalities of gait and mobility: Secondary | ICD-10-CM | POA: Diagnosis not present

## 2015-11-23 DIAGNOSIS — M6281 Muscle weakness (generalized): Secondary | ICD-10-CM

## 2015-11-23 NOTE — Therapy (Signed)
[facility] [address] [place], [place], [address] Phone: [phone]   Fax:  [phone]  Physical Therapy Treatment  Patient Details  Name: Jeanne Lawson MRN: [id number] Date of Birth: [birth year] Referring Provider: Dr. [name]  Encounter Date: 11/23/2015      PT End of Session - 11/23/15 1317    Visit Number 14   Number of Visits 14   Date for PT Re-Evaluation 12/03/15   Authorization Type UHC Medicare   Authorization Time Period 11-02-15 - 01-01-16   PT Start Time 0802   PT Stop Time 0848   PT Time Calculation (min) 46 min      Past Medical History:  Diagnosis Date  . COPD (chronic obstructive pulmonary disease) ([facility])   . Coronary artery disease 1996   Known with prior mild lesion of LAD demonstrated by Cardiac Catheterization in 1996  . Edema of foot    She has a history of chronic edema of the left dated back to age 16 when she suufered severe frostbite playing  on the snow as a child  . Hypertension   . PAC (premature atrial contraction)   . Pancreatitis    x2  . PONV (postoperative nausea and vomiting)    problems waking up last time 4/16 only time  . 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  Marland Kitchen Shortness of breath dyspnea    since stroke 2 months ago -  . 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 TIA (transient ischemic attack)    She was hospitalized 04-23-09 through 04-27-09 for involving right side of the body  . Tobacco abuse    Ongoing   . Ventricular hypertrophy 04/2009   LVH with diastolic dysfunction by echo. Has normal EF.    Past Surgical History:  Procedure Laterality Date  . ABDOMINAL HYSTERECTOMY    . APPENDECTOMY    . CARDIAC CATHETERIZATION  1996   Mild CAD with vasospasm  . CARDIOVASCULAR  STRESS TEST  12-02-2001   EF 70%  . ENDARTERECTOMY Right 08/12/2014   Procedure: RIGHT  COMMON CAROTID ARTERY EXPOSURE FOR INTERVENTIONAL RADIOLOGY PROCEDURE BY DR.DEVASHWAR,Insertion 6 FR sheath;  Surgeon: Elam Dutch, MD;  Location: [facility];  Service: Vascular;  Laterality: Right;  . ENDARTERECTOMY Right 08/12/2014   Procedure:  CAROTID  EXPOSURE CLOSURE RIGHT NECK, REPAIR RIGHT COMMON CAROTID ARTERY;  Surgeon: Elam Dutch, MD;  Location: [facility];  Service: Vascular;  Laterality: Right;  . ENDARTERECTOMY Left 11/18/2014   Procedure: CAROTID EXPOSURE;  Surgeon: Elam Dutch, MD;  Location: [facility];  Service: Vascular;  Laterality: Left;  . ENDARTERECTOMY Left 11/18/2014   Procedure: CLOSURE CAROTID;  Surgeon: Elam Dutch, MD;  Location: [facility];  Service: Vascular;  Laterality: Left;  . LAPAROSCOPY    . NECK SURGERY  50 yrs ago   Left side tumor  . RADIOLOGY WITH ANESTHESIA N/A 05/25/2014   Procedure: RADIOLOGY WITH ANESTHESIA;  Surgeon: [name], MD;  Location: [facility];  Service: Radiology;  Laterality: N/A;  . RADIOLOGY WITH ANESTHESIA N/A 08/12/2014   Procedure: RADIOLOGY WITH ANESTHESIA;  Surgeon: [name], MD;  Location: [facility];  Service: Radiology;  Laterality: N/A;  . RADIOLOGY WITH ANESTHESIA N/A 03/15/2015   Procedure: MRI  OF BRAIN WITHOUT CONTRAST;  Surgeon: Medication Radiologist, MD;  Location: [facility];  Service: Radiology;  Laterality: N/A;  DR. TAT/MRI  . US ECHOCARDIOGRAPHY  04-26-2009   EF 65-70%  . VESICOVAGINAL FISTULA CLOSURE W/ TAH  25 yrs ago    There were no vitals filed for this visit.      Subjective Assessment - 11/23/15 1009    Subjective Pt reports she was very active this past week - walked from her house to her neighbor's house without any problem   Pertinent History h/o old R CVA in 2010; TIA 04-23-09 - 04-27-09:  CAD:  COPD:  chronic edema in L foot dated back to age 42: saccular aneurysm, paroxysmal atrial fibrillation, known cerebral  aneurysms by MRA in 2011; ventricular hypertrophy:  HTN   Diagnostic tests MRI on 03-17-15; previous MRI's due to previous CVA   Patient Stated Goals increase LLE strength and walk better   Currently in Pain? No/denies                         [facility] Adult PT Treatment/Exercise - 11/23/15 0811      Lumbar Exercises: Stretches   Active Hamstring Stretch 1 rep;30 seconds     Lumbar Exercises: Aerobic   Stationary Bike SciFit level 1.5 x 5"     Lumbar Exercises: Standing   Heel Raises 10 reps  LLE only 10 reps   Functional Squats 10 reps  on Bosu   Other Standing Lumbar Exercises Pt performed standing hip extension, abduction and flexion with red theraband  x 10 reps each leg     Knee/Hip Exercises: Stretches   Gastroc Stretch 1 rep;30 seconds;Left   Other Knee/Hip Stretches Runner's stretch - LLE - 30 sec hold in standing     Knee/Hip Exercises: Machines for Strengthening   Cybex Leg Press 40# bil. LE's 10 reps:  LLE only 25# 2 sets 10 reps     Knee/Hip Exercises: Standing   Forward Step Up Left;1 set;10 reps;Step Height: 6"   Step Down Left;1 set;10 reps;Step Height: 6"   Functional Squat 1 set;10 reps  on BOSU     NeuroRe-ed:  Standing on BOSU - inside // bars - with UE support prn - moving RLE up/back and laterally x 10 reps each  For improved L SLS Stepping over and back of balance beam with 2# weight on LLE - 10 reps with UE support prn; stepping over and back  With RLE 10 reps for improved L SLS            PT Short Term Goals - 06/29/15 0855      PT SHORT TERM GOAL #1   Title Increase gait velocity to >/= 2.60 ft/sec with cane for incr. gait efficiency.  (06-09-15)   Baseline 2.43 === 13.5 secs   Status Not Met     PT SHORT TERM GOAL #2   Title Pt will report at least 25% improvement in ambulation with incr. ease with L foot clearance.  (06-09-15)   Baseline fluctuates based on edema- 06-29-15   Status Partially Met     PT SHORT TERM GOAL #3    Title Incr. amb. distance to at least 300' in 2" 10 secs  (06-07-15)   Baseline 360' with cane  - 06-29-15   Status Achieved     PT SHORT TERM GOAL #5   Title Independent in HEP for  LLE strengthening exercises.  (06-09-15)   Status  Achieved           PT Long Term Goals - 11/02/15 1049      PT LONG TERM GOAL #1   Title Independent in updated HEP for LLE strengthening and stretching (07-09-15)   Baseline Extend LTG's til 10-12-15 -- met 11-02-15   Status Achieved     PT LONG TERM GOAL #2   Title Report at least 50% improvement in LLE flexibility and ease of movement (6-16--17)/ 11-06-15   Baseline remains inconsistently met -  11-02-15   Time 4   Period Weeks   Status On-going     PT LONG TERM GOAL #4   Title Incr. gait velocity to >/= 2.9 ft/sec with cane for incr. gait efficiency.  (07-09-15)/ 11-06-15   Baseline 2.43 ft/sec with cane   Time 4   Period Weeks   Status On-going     PT LONG TERM GOAL #5   Title Amb. 6" nonstop with cane to demo incr. endurance/activity tolerance.  (07-09-15)/ 10-12-15/ 11-06-15   Baseline (200' x 3 = 600') nonstop with cane = met 11-02-15   Time 4   Period Weeks   Status Achieved     PT LONG TERM GOAL #7   Title Perform SLS on LLE >/= 5 secs to demo improved balance.  (12-03-15)   Baseline 3.47 secs   Time 4   Period Weeks   Status New     PT LONG TERM GOAL #8   Title Transfer floor to stand with UE support with SBA/verbal cues.  (12-03-15)   Time 4   Period Weeks   Status New               Plan - 11/23/15 1317    Clinical Impression Statement Pt continues to have decreased L foot clearance in swing phase of gait with decr. L hip and knee flexion - able to increase with cues to lift LLE: pt is progressing well towards LTG's - plan D/C next week   Rehab Potential Good   PT Frequency 1x / week   PT Duration 4 weeks   PT Treatment/Interventions ADLs/Self Care Home Management;Therapeutic exercise;Therapeutic  activities;Functional mobility training;Stair training;Gait training;DME Instruction;Balance training;Neuromuscular re-education;Patient/family education;Orthotic Fit/Training;Passive range of motion   PT Next Visit Plan cont ther ex   PT Home Exercise Plan Stretches and strengthening for LLE   Consulted and Agree with Plan of Care Patient      Patient will benefit from skilled therapeutic intervention in order to improve the following deficits and impairments:  Abnormal gait, Cardiopulmonary status limiting activity, Decreased activity tolerance, Decreased balance, Decreased mobility, Decreased strength, Decreased knowledge of use of DME, Decreased endurance, Decreased coordination, Decreased range of motion, Increased edema, Impaired flexibility  Visit Diagnosis: Other abnormalities of gait and mobility  Muscle weakness (generalized)     Problem List Patient Active Problem List   Diagnosis Date Noted  . 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 Aneurysm, cerebral, nonruptured 05/31/2015  . Dizziness and giddiness 05/31/2015  . Altered mental status   . Acute CVA (cerebrovascular accident) ([place]) 03/15/2015  . Dysphasia 03/12/2015  . Thrombocytopenia ([place]) 03/12/2015  . Hypokalemia   . Endotracheally intubated   . Essential hypertension   . Intracranial aneurysm 11/18/2014  . Respiratory failure ([place])   . Cerebral aneurysm   . Brain aneurysm   . Aphasia   . Palmar erythema   . CVA (cerebral infarction) 05/02/2014  . Aneurysm ([place]) 05/02/2014  . Expressive aphasia 05/02/2014  .  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 Tobacco abuse 10/24/2010  . TIA (transient ischemic attack)   . Hypertension   . Ventricular hypertrophy   . CVA 02/23/2010  . STRESS FRACTURE, FOOT 02/22/2010    Vanya Carberry, [place], PT 11/23/2015, 1:27 PM  [facility] [address] [place], [place], [address] Phone: [phone]   Fax:  [phone]  Name: Jeanne Lawson MRN: [id number] Date of Birth: 16-Sep-1945

## 2015-11-29 ENCOUNTER — Ambulatory Visit (INDEPENDENT_AMBULATORY_CARE_PROVIDER_SITE_OTHER): Payer: Medicare Other | Admitting: Neurology

## 2015-11-29 ENCOUNTER — Encounter: Payer: Self-pay | Admitting: Neurology

## 2015-11-29 ENCOUNTER — Ambulatory Visit: Payer: Medicare Other | Admitting: Physical Therapy

## 2015-11-29 VITALS — BP 139/76 | HR 58 | Resp 20 | Ht 61.0 in | Wt 166.0 lb

## 2015-11-29 DIAGNOSIS — I6990 Unspecified sequelae of unspecified cerebrovascular disease: Secondary | ICD-10-CM

## 2015-11-29 NOTE — Progress Notes (Signed)
[facility] [address] [place]. [place] [address] [phone]       OFFICE FOLLOW-UP NOTE  Jeanne. Jeanne Lawson Date of Birth:  December 26, 1945 Medical Record Number:  [id number]   HPI:  Office visit 05/31/2015 Jeanne Lawson  is a 70 year old lady seen today for first office follow-up visit following hospital admission for TIA in February 2017. She is a complaint by daughter today.She was at home 03/12/2015 when at 1030 "she felt fuzzy headed and not right. Daughter noted she was having a hard time getting her thoughts out". This lasted until 12. She was brought to the hospital for fear of her having a stroke. Where her  fully cleared. Patient was not administered TPA . She was admitted for further evaluation and treatment. MRI scan of the brain showed Multiple scattered small round foci of restricted diffusion in the left periatrial white matter and along the lateral aspect of the left occipital horn in the left occipital lobe. There was a chronic age left basal ganglial lacunar infarct as well as multiple chronic but new microhemorrhages compared with previous MRI dated 2016. MRA showed no large vessel stenosis but showed successfully coiled bilateral ICA terminus aneurysms. Transthoracic echo showed normal ejection fraction. EEG showed intermittent left temporal sharp discharges but no definite epileptiform activity. LDL cholesterol was optimal at 63 and hemoglobin A1c was 5.5. Carotid ultrasound showed no significant extracranial stenosis. Patient infarcts on MRI were felt to be clinically silent and her dizziness was felt to be multifactorial and it cleared quickly. She was advised to continue aspirin and Plavix because of intracranial biplane stent aneurysms treatment. She has subsequently reduced and the Plavix to every other day because of significant bruising. She is has had no recurrent stroke or TIA symptoms. She is currently participating in  outpatient physical and occupational  therapy. She is concerned about swelling in her feet. She has no new stroke or TIA symptoms. Update 11/29/2015 : She returns for follow-up after last visit 6 months ago. He recommended by husband. Patient continues to have gait and balance difficulties but has been using a cane. She is currently getting outpatient physical therapy which seems to be helping a lot. She's had no recent falls or injuries. She has recently seen by Dr. Meda Coffee a cardiologist and everything checked out fine. She remains on aspirin and Plavix which is tolerating well with minor bruising but no bleeding episodes. Her blood pressure is well controlled and usually stays in the 123456 to 0000000 systolic range. The patient discontinued Lipitor about a month ago as she ran out of it but after counseled her she is willing to go back on it. She is gained 20 pounds weight despite stating that she eats healthy and is quite active. She has not discussed this yet with her primary physician. ROS:   14 system review of systems is positive for  no complaints except , weight gain,balance and gait difficulties only all other systems negative  PMH:  Past Medical History:  Diagnosis Date  . COPD (chronic obstructive pulmonary disease) ([place])   . Coronary artery disease 1996   Known with prior mild lesion of LAD demonstrated by Cardiac Catheterization in 1996  . Edema of foot    She has a history of chronic edema of the left dated back to age 13 when she suufered severe frostbite playing  on the snow as a child  . Hypertension   . PAC (premature atrial contraction)   . Pancreatitis  x2  . PONV (postoperative nausea and vomiting)    problems waking up last time 4/16 only time  . 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  Marland Kitchen Shortness of breath dyspnea    since stroke 2 months ago -  . 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 TIA (transient ischemic attack)    She was hospitalized 04-23-09 through 04-27-09 for involving right side of the body  . Tobacco abuse    Ongoing   . Ventricular hypertrophy 04/2009   LVH with diastolic dysfunction by echo. Has normal EF.    Social History:  Social History   Social History  . Marital status: Widowed    Spouse name: N/A  . Number of children: N/A  . Years of education: N/A   Occupational History  . Not on file.   Social History Main Topics  . Smoking status: Former Smoker    Packs/day: 1.00    Years: 30.00    Types: Cigarettes    Quit date: 04/26/2014  . Smokeless tobacco: Never Used  . Alcohol use No  . Drug use: No  . Sexual activity: Not on file   Other Topics Concern  . Not on file   Social History Narrative  . No narrative on file    Medications:   Current Outpatient Prescriptions on File Prior to Visit  Medication Sig Dispense Refill  . albuterol (PROVENTIL HFA;VENTOLIN HFA) 108 (90 BASE) MCG/ACT inhaler Inhale 1-2 puffs into the lungs every 6 (six) hours as needed for wheezing or shortness of breath.    Marland Kitchen aspirin 81 MG tablet Take 81 mg by mouth daily.    . clopidogrel (PLAVIX) 75 MG tablet Take 0.5 tablets (37.5 mg total) by mouth daily.    Marland Kitchen lisinopril-hydrochlorothiazide (PRINZIDE,ZESTORETIC) 20-12.5 MG tablet Take 1 tablet by mouth daily. 30 tablet 11  . TOPROL XL 50 MG 24 hr tablet Take 25 mg by mouth twice a day with or immediately following a meal. 60 tablet 11   No current facility-administered medications on file prior to visit.     Allergies:   Allergies  Allergen Reactions  . Dilaudid [Hydromorphone] Swelling  . Codeine Nausea And Vomiting  . Hydromorphone Hcl Swelling  . Latex Swelling    gloves used at dental office caused lips to swell. Used different ones no problem. Never had any problem at hospital or anywhere else.  . Sulfa Drugs Cross Reactors Other (See  Comments)    Unknown childhood reaction    Physical Exam General: well developed, well nourished,elderly lady seated, in no evident distress Head: head normocephalic and atraumatic.  Neck: supple with no carotid or supraclavicular bruits Cardiovascular: regular rate and rhythm, no murmurs Musculoskeletal: no deformity Skin:  no rash/petichiae.Old surgical scar right supraclavicular neck.  1+ pedal edema bilaterally Vascular:  Normal pulses all extremities Vitals:   11/29/15 1054  BP: 139/76  Pulse: (!) 58  Resp: 20   Neurologic Exam Mental Status: Awake and fully alert. Oriented to place and time. Recent and remote memory intact. Attention span, concentration and fund of knowledge appropriate. Mood and affect appropriate.  Cranial Nerves: Fundoscopic exam Not done today Pupils equal, briskly reactive to light. Extraocular movements full without nystagmus. Visual fields full to confrontation. Hearing intact. Facial  sensation intact. Face, tongue, palate moves normally and symmetrically.  Motor: Mild spastic left hemiparesis with 4/5 strength. Weakness of left grip and intrinsic hand muscles. Orbits right over left upper extremity. Increased tone in the left leg. Sensory.: intact to touch ,pinprick .position and vibratory sensation.  Coordination: Rapid alternating movements normal in all extremities. Finger-to-nose and heel-to-shin performed accurately bilaterally. Gait and Station: Arises from chair without difficulty. Stance is normal. Gait demonstrates mild dragging of the left leg. And uses a cane  Reflexes: 1+ and symmetric. Toes downgoing.      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 Vascular risk factors of hypertension, CAD,Hyperlipidimia and cerebrovascular disease. Patient is stable from neurovascular  standpoint    PLAN: I had a long discussion the patient with regards to her remote stroke and post stroke spasticity and gait difficulties. I recommend she continue aspirin and Plavix for stroke prevention given her intracranial aneurysms and stent treatment. Maintain strict control of hypertension with blood pressure goal below 130/90 and lipids with LDL cholesterol goal below 70 mg percent. I counseled her to resume taking Lipitor every day and she is agreeable. She was advised to see her primary care physician to discuss her weight gain issues. She was encouraged to use her cane at all times and continue ongoing outpatient physical therapy for gait and balance training She will return for follow-up in a year or call earlier if necessary. Antony Contras, MD  [facility] [address] [place], [place] [address]  Phone [phone] Fax [phone]  Note: This document was prepared with digital dictation and possible smart phrase technology. Any transcriptional errors that result from this process are unintentional

## 2015-11-29 NOTE — Patient Instructions (Addendum)
I had a long discussion the patient with regards to her remote stroke and post stroke spasticity and gait difficulties. I recommend she continue aspirin and Plavix for stroke prevention given her intracranial aneurysms and stent treatment. Maintain strict control of hypertension with blood pressure goal below 130/90 and lipids with LDL cholesterol goal below 70 mg percent. I counseled her to resume taking Lipitor every day and she is agreeable. She was advised to see her primary care physician to discuss her weight gain issues. She was encouraged to use her cane at all times and continue ongoing outpatient physical therapy for gait and balance training She will return for follow-up in a year or call earlier if necessary.   Fall Prevention in the Home  Falls can cause injuries. They can happen to people of all ages. There are many things you can do to make your home safe and to help prevent falls.  WHAT CAN I DO ON THE OUTSIDE OF MY HOME?  Regularly fix the edges of walkways and driveways and fix any cracks.  Remove anything that might make you trip as you walk through a door, such as a raised step or threshold.  Trim any bushes or trees on the path to your home.  Use bright outdoor lighting.  Clear any walking paths of anything that might make someone trip, such as rocks or tools.  Regularly check to see if handrails are loose or broken. Make sure that both sides of any steps have handrails.  Any raised decks and porches should have guardrails on the edges.  Have any leaves, snow, or ice cleared regularly.  Use sand or salt on walking paths during winter.  Clean up any spills in your garage right away. This includes oil or grease spills. WHAT CAN I DO IN THE BATHROOM?   Use night lights.  Install grab bars by the toilet and in the tub and shower. Do not use towel bars as grab bars.  Use non-skid mats or decals in the tub or shower.  If you need to sit down in the shower, use a  plastic, non-slip stool.  Keep the floor dry. Clean up any water that spills on the floor as soon as it happens.  Remove soap buildup in the tub or shower regularly.  Attach bath mats securely with double-sided non-slip rug tape.  Do not have throw rugs and other things on the floor that can make you trip. WHAT CAN I DO IN THE BEDROOM?  Use night lights.  Make sure that you have a light by your bed that is easy to reach.  Do not use any sheets or blankets that are too big for your bed. They should not hang down onto the floor.  Have a firm chair that has side arms. You can use this for support while you get dressed.  Do not have throw rugs and other things on the floor that can make you trip. WHAT CAN I DO IN THE KITCHEN?  Clean up any spills right away.  Avoid walking on wet floors.  Keep items that you use a lot in easy-to-reach places.  If you need to reach something above you, use a strong step stool that has a grab bar.  Keep electrical cords out of the way.  Do not use floor polish or wax that makes floors slippery. If you must use wax, use non-skid floor wax.  Do not have throw rugs and other things on the floor that can make  you trip. WHAT CAN I DO WITH MY STAIRS?  Do not leave any items on the stairs.  Make sure that there are handrails on both sides of the stairs and use them. Fix handrails that are broken or loose. Make sure that handrails are as long as the stairways.  Check any carpeting to make sure that it is firmly attached to the stairs. Fix any carpet that is loose or worn.  Avoid having throw rugs at the top or bottom of the stairs. If you do have throw rugs, attach them to the floor with carpet tape.  Make sure that you have a light switch at the top of the stairs and the bottom of the stairs. If you do not have them, ask someone to add them for you. WHAT ELSE CAN I DO TO HELP PREVENT FALLS?  Wear shoes that:  Do not have high heels.  Have  rubber bottoms.  Are comfortable and fit you well.  Are closed at the toe. Do not wear sandals.  If you use a stepladder:  Make sure that it is fully opened. Do not climb a closed stepladder.  Make sure that both sides of the stepladder are locked into place.  Ask someone to hold it for you, if possible.  Clearly mark and make sure that you can see:  Any grab bars or handrails.  First and last steps.  Where the edge of each step is.  Use tools that help you move around (mobility aids) if they are needed. These include:  Canes.  Walkers.  Scooters.  Crutches.  Turn on the lights when you go into a dark area. Replace any light bulbs as soon as they burn out.  Set up your furniture so you have a clear path. Avoid moving your furniture around.  If any of your floors are uneven, fix them.  If there are any pets around you, be aware of where they are.  Review your medicines with your doctor. Some medicines can make you feel dizzy. This can increase your chance of falling. Ask your doctor what other things that you can do to help prevent falls.   This information is not intended to replace advice given to you by your health care provider. Make sure you discuss any questions you have with your health care provider.   Document Released: 11/05/2008 Document Revised: 05/26/2014 Document Reviewed: 02/13/2014 Elsevier Interactive Patient Education [employer].

## 2015-11-30 ENCOUNTER — Ambulatory Visit: Payer: Medicare Other | Attending: Internal Medicine | Admitting: Physical Therapy

## 2015-11-30 DIAGNOSIS — R2689 Other abnormalities of gait and mobility: Secondary | ICD-10-CM | POA: Diagnosis not present

## 2015-11-30 DIAGNOSIS — M6281 Muscle weakness (generalized): Secondary | ICD-10-CM | POA: Insufficient documentation

## 2015-12-01 NOTE — Therapy (Signed)
[facility] [address] [place], [place], [address] Phone: [phone]   Fax:  [phone]  Physical Therapy Treatment  Patient Details  Name: Jeanne Lawson MRN: [id number] Date of Birth: 05/08/1945 Referring Provider: Dr. Scott Holwerda  Encounter Date: 11/30/2015      PT End of Session - 12/01/15 1151    Visit Number 15   Number of Visits 15   Date for PT Re-Evaluation 12/03/15   Authorization Type [employer] Medicare   Authorization Time Period 11-02-15 - 01-01-16   PT Start Time 0801   PT Stop Time 0846   PT Time Calculation (min) 45 min      Past Medical History:  Diagnosis Date  . COPD (chronic obstructive pulmonary disease) ([facility])   . Coronary artery disease 1996   Known with prior mild lesion of LAD demonstrated by Cardiac Catheterization in 1996  . Edema of foot    She has a history of chronic edema of the left dated back to age 10 when she suufered severe frostbite playing  on the snow as a child  . Hypertension   . PAC (premature atrial contraction)   . Pancreatitis    x2  . PONV (postoperative nausea and vomiting)    problems waking up last time 4/16 only time  . 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  . Shortness of breath dyspnea    since stroke 2 months ago -  . 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 TIA (transient ischemic attack)    She was hospitalized 04-23-09 through 04-27-09 for involving right side of the body  . Tobacco abuse    Ongoing   . Ventricular hypertrophy 04/2009   LVH with diastolic dysfunction by echo. Has normal EF.    Past Surgical History:  Procedure Laterality Date  . ABDOMINAL HYSTERECTOMY    . APPENDECTOMY    . CARDIAC CATHETERIZATION  1996   Mild CAD with vasospasm  . CARDIOVASCULAR  STRESS TEST  12-02-2001   EF 70%  . ENDARTERECTOMY Right 08/12/2014   Procedure: RIGHT  COMMON CAROTID ARTERY EXPOSURE FOR INTERVENTIONAL RADIOLOGY PROCEDURE BY DR.DEVASHWAR,Insertion 6 FR sheath;  Surgeon: Charles E Fields, MD;  Location: [facility] OR;  Service: Vascular;  Laterality: Right;  . ENDARTERECTOMY Right 08/12/2014   Procedure:  CAROTID  EXPOSURE CLOSURE RIGHT NECK, REPAIR RIGHT COMMON CAROTID ARTERY;  Surgeon: Charles E Fields, MD;  Location: [facility] OR;  Service: Vascular;  Laterality: Right;  . ENDARTERECTOMY Left 11/18/2014   Procedure: CAROTID EXPOSURE;  Surgeon: Charles E Fields, MD;  Location: [facility];  Service: Vascular;  Laterality: Left;  . ENDARTERECTOMY Left 11/18/2014   Procedure: CLOSURE CAROTID;  Surgeon: Charles E Fields, MD;  Location: [facility];  Service: Vascular;  Laterality: Left;  . LAPAROSCOPY    . NECK SURGERY  50 yrs ago   Left side tumor  . RADIOLOGY WITH ANESTHESIA N/A 05/25/2014   Procedure: RADIOLOGY WITH ANESTHESIA;  Surgeon: Sanjeev Deveshwar, MD;  Location: [facility];  Service: Radiology;  Laterality: N/A;  . RADIOLOGY WITH ANESTHESIA N/A 08/12/2014   Procedure: RADIOLOGY WITH ANESTHESIA;  Surgeon: Sanjeev Deveshwar, MD;  Location: [facility];  Service: Radiology;  Laterality: N/A;  . RADIOLOGY WITH ANESTHESIA N/A 03/15/2015   Procedure: MRI   OF BRAIN WITHOUT CONTRAST;  Surgeon: Medication Radiologist, MD;  [facility];  Service: Radiology;  Laterality: N/A;  DR. TAT/MRI  . US ECHOCARDIOGRAPHY  04-26-2009   EF 65-70%  . VESICOVAGINAL FISTULA CLOSURE W/ TAH  25 yrs ago    There were no vitals filed for this visit.      Subjective Assessment - 12/01/15 1147    Subjective Pt reports she saw Dr. [name] on Monday - got a good report - says he recommended her continuing with PT   Pertinent History h/o old R CVA in 2010; TIA 04-23-09 - 04-27-09:  CAD:  COPD:  chronic edema in L foot dated back to age 70: saccular aneurysm, paroxysmal atrial fibrillation, known cerebral aneurysms by MRA  in 2011; ventricular hypertrophy:  HTN   Diagnostic tests MRI on 03-17-15; previous MRI's due to previous CVA   Patient Stated Goals increase LLE strength and walk better   Currently in Pain? No/denies                         [facility] Adult PT Treatment/Exercise - 12/01/15 0001      Lumbar Exercises: Stretches   Active Hamstring Stretch 1 rep;30 seconds     Lumbar Exercises: Aerobic   Stationary Bike SciFit level 1.5 x 5"     Knee/Hip Exercises: Machines for Strengthening   Cybex Leg Press 40# bil. LE's 10 reps:  LLE only 30# 2 sets 10 reps     Knee/Hip Exercises: Standing   Heel Raises Both;10 reps   Forward Step Up Left;1 set;10 reps;Step Height: 6"   Step Down Left;1 set;10 reps;Step Height: 6"   Functional Squat 1 set;10 reps  on BOSU   Other Standing Knee Exercises bil. hip flexion, extension and abduction with red theraband x 10 reps each in standing     NeuroRe-ed: standing on BOSU - moving RLE up/back and laterally x 10 reps for improved L SLS Marching in place with lifting opposite UE to improve coordination  Standing - L hip flexion- L knee extension - then knee flexion and return to standing position for improved balance and LLE Control and coordination   Pt performed tap ups to 2nd step with 3# weight on LLE for incr. Hip flexor strength - 10 reps  Lateral step ups to 6" step for incr. L hip abductor strength          PT Short Term Goals - 06/29/15 0855      PT SHORT TERM GOAL #1   Title Increase gait velocity to >/= 2.60 ft/sec with cane for incr. gait efficiency.  (06-09-15)   Baseline 2.43 === 13.5 secs   Status Not Met     PT SHORT TERM GOAL #2   Title Pt will report at least 25% improvement in ambulation with incr. ease with L foot clearance.  (06-09-15)   Baseline fluctuates based on edema- 06-29-15   Status Partially Met     PT SHORT TERM GOAL #3   Title Incr. amb. distance to at least 300' in 2" 10 secs  (06-07-15)   Baseline 360'  with cane  - 06-29-15   Status Achieved     PT SHORT TERM GOAL #5   Title Independent in HEP for  LLE strengthening exercises.  (06-09-15)   Status Achieved           PT Long Term Goals - 11/02/15 1049      PT LONG TERM GOAL #  1   Title Independent in updated HEP for LLE strengthening and stretching (07-09-15)   Baseline Extend LTG's til 10-12-15 -- met 11-02-15   Status Achieved     PT LONG TERM GOAL #2   Title Report at least 50% improvement in LLE flexibility and ease of movement (6-16--17)/ 11-06-15   Baseline remains inconsistently met -  11-02-15   Time 4   Period Weeks   Status On-going     PT LONG TERM GOAL #4   Title Incr. gait velocity to >/= 2.9 ft/sec with cane for incr. gait efficiency.  (07-09-15)/ 11-06-15   Baseline 2.43 ft/sec with cane   Time 4   Period Weeks   Status On-going     PT LONG TERM GOAL #5   Title Amb. 6" nonstop with cane to demo incr. endurance/activity tolerance.  (07-09-15)/ 10-12-15/ 11-06-15   Baseline (200' x 3 = 600') nonstop with cane = met 11-02-15   Time 4   Period Weeks   Status Achieved     PT LONG TERM GOAL #7   Title Perform SLS on LLE >/= 5 secs to demo improved balance.  (12-03-15)   Baseline 3.47 secs   Time 4   Period Weeks   Status New     PT LONG TERM GOAL #8   Title Transfer floor to stand with UE support with SBA/verbal cues.  (12-03-15)   Time 4   Period Weeks   Status New               Plan - 12/01/15 1151    Clinical Impression Statement Pt continues to have decreased L foot clearance in swing due to dorsiflexor weakness and due to edema in LLE and foot/ankle which limits flexibility:  pt is progressing towards goals as balance and gait are improving   Rehab Potential Good   PT Frequency 1x / week   PT Duration 4 weeks   PT Treatment/Interventions ADLs/Self Care Home Management;Therapeutic exercise;Therapeutic activities;Functional mobility training;Stair training;Gait training;DME Instruction;Balance  training;Neuromuscular re-education;Patient/family education;Orthotic Fit/Training;Passive range of motion   PT Next Visit Plan check LTG's - complete recert at next visit   PT Home Exercise Plan Stretches and strengthening for LLE   Consulted and Agree with Plan of Care Patient      Patient will benefit from skilled therapeutic intervention in order to improve the following deficits and impairments:  Abnormal gait, Cardiopulmonary status limiting activity, Decreased activity tolerance, Decreased balance, Decreased mobility, Decreased strength, Decreased knowledge of use of DME, Decreased endurance, Decreased coordination, Decreased range of motion, Increased edema, Impaired flexibility  Visit Diagnosis: Other abnormalities of gait and mobility  Muscle weakness (generalized)     Problem List Patient Active Problem List   Diagnosis Date Noted  . 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 Aneurysm, cerebral, nonruptured 05/31/2015  . Dizziness and giddiness 05/31/2015  . Altered mental status   . Acute CVA (cerebrovascular accident) ([facility]) 03/15/2015  . Dysphasia 03/12/2015  . Thrombocytopenia ([facility]) 03/12/2015  . Hypokalemia   . Endotracheally intubated   . Essential hypertension   . Intracranial aneurysm 11/18/2014  . Respiratory failure ([facility])   . Cerebral aneurysm   . Brain aneurysm   . Aphasia   . Palmar erythema   . CVA (cerebral infarction) 05/02/2014  . Aneurysm ([facility]) 05/02/2014  . Expressive aphasia 05/02/2014  . 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 Tobacco abuse 10/24/2010  . TIA (transient ischemic attack)   .   Hypertension   . Ventricular hypertrophy   . CVA 02/23/2010  . STRESS FRACTURE, FOOT 02/22/2010    Dilday, Linda Suzanne 12/01/2015, 11:56 AM  [facility] [address] [place], [place], [address] Phone: [phone]   Fax:   [phone]  Name: Crosby D Raven MRN: [id number] Date of Birth: 05/11/1945    

## 2015-12-27 ENCOUNTER — Ambulatory Visit: Payer: Medicare Other | Attending: Internal Medicine | Admitting: Physical Therapy

## 2015-12-27 DIAGNOSIS — R2689 Other abnormalities of gait and mobility: Secondary | ICD-10-CM | POA: Diagnosis present

## 2015-12-27 DIAGNOSIS — M6281 Muscle weakness (generalized): Secondary | ICD-10-CM | POA: Diagnosis present

## 2015-12-27 NOTE — Therapy (Signed)
[facility] [address] [place], [place], [address] Phone: [phone]   Fax:  [phone]  Physical Therapy Treatment  Patient Details  Name: Jeanne Lawson MRN: [id number] Date of Birth: Jul 18, 1945 Referring Provider: Dr. [name]  Encounter Date: 12/27/2015      PT End of Session - 12/27/15 1624    Visit Number 16  G1   Number of Visits 19   Date for PT Re-Evaluation 02/23/16   Authorization Type UHC Medicare   Authorization Time Period 12-27-15 - 02-23-16   PT Start Time 0847   PT Stop Time 0933   PT Time Calculation (min) 46 min      Past Medical History:  Diagnosis Date  . COPD (chronic obstructive pulmonary disease) ([place])   . Coronary artery disease 1996   Known with prior mild lesion of LAD demonstrated by Cardiac Catheterization in 1996  . Edema of foot    She has a history of chronic edema of the left dated back to age 89 when she suufered severe frostbite playing  on the snow as a child  . Hypertension   . PAC (premature atrial contraction)   . Pancreatitis    x2  . PONV (postoperative nausea and vomiting)    problems waking up last time 4/16 only time  . 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  Marland Kitchen Shortness of breath dyspnea    since stroke 2 months ago -  . 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 TIA (transient ischemic attack)    She was hospitalized 04-23-09 through 04-27-09 for involving right side of the body  . Tobacco abuse    Ongoing   . Ventricular hypertrophy 04/2009   LVH with diastolic dysfunction by echo. Has normal EF.    Past Surgical History:  Procedure Laterality Date  . ABDOMINAL HYSTERECTOMY    . APPENDECTOMY    . CARDIAC CATHETERIZATION  1996   Mild CAD with vasospasm  .  CARDIOVASCULAR STRESS TEST  12-02-2001   EF 70%  . ENDARTERECTOMY Right 08/12/2014   Procedure: RIGHT  COMMON CAROTID ARTERY EXPOSURE FOR INTERVENTIONAL RADIOLOGY PROCEDURE BY DR.DEVASHWAR,Insertion 6 FR sheath;  Surgeon: Elam Dutch, MD;  Location: [place];  Service: Vascular;  Laterality: Right;  . ENDARTERECTOMY Right 08/12/2014   Procedure:  CAROTID  EXPOSURE CLOSURE RIGHT NECK, REPAIR RIGHT COMMON CAROTID ARTERY;  Surgeon: Elam Dutch, MD;  Location: [place];  Service: Vascular;  Laterality: Right;  . ENDARTERECTOMY Left 11/18/2014   Procedure: CAROTID EXPOSURE;  Surgeon: Elam Dutch, MD;  Location: [place];  Service: Vascular;  Laterality: Left;  . ENDARTERECTOMY Left 11/18/2014   Procedure: CLOSURE CAROTID;  Surgeon: Elam Dutch, MD;  Location: [place];  Service: Vascular;  Laterality: Left;  . LAPAROSCOPY    . NECK SURGERY  50 yrs ago   Left side tumor  . RADIOLOGY WITH ANESTHESIA N/A 05/25/2014   Procedure: RADIOLOGY WITH ANESTHESIA;  Surgeon: [name], MD;  Location: [place];  Service: Radiology;  Laterality: N/A;  . RADIOLOGY WITH ANESTHESIA N/A 08/12/2014   Procedure: RADIOLOGY WITH ANESTHESIA;  Surgeon: [name], MD;  Location: [facility];  Service: Radiology;  Laterality: N/A;  . RADIOLOGY WITH ANESTHESIA N/A 03/15/2015  Procedure: MRI OF BRAIN WITHOUT CONTRAST;  Surgeon: Medication Radiologist, MD;  Location: [place];  Service: Radiology;  Laterality: N/A;  DR. TAT/MRI  . US ECHOCARDIOGRAPHY  04-26-2009   EF 65-70%  . VESICOVAGINAL FISTULA CLOSURE W/ TAH  25 yrs ago    There were no vitals filed for this visit.      Subjective Assessment - 12/27/15 1615    Subjective Pt reports she has been doing exercises some in past month - has been busy- has done alot of walking   Pertinent History h/o old R CVA in 2010; TIA 04-23-09 - 04-27-09:  CAD:  COPD:  chronic edema in L foot dated back to age 76: saccular aneurysm, paroxysmal atrial fibrillation, known cerebral  aneurysms by MRA in 2011; ventricular hypertrophy:  HTN   Diagnostic tests MRI on 03-17-15; previous MRI's due to previous CVA   Patient Stated Goals increase LLE strength and walk better   Currently in Pain? No/denies                         [facility] Adult PT Treatment/Exercise - 12/27/15 0903      Ambulation/Gait   Ambulation/Gait Yes   Ambulation/Gait Assistance 5: Supervision   Ambulation/Gait Assistance Details 6" walk test  730' with cane   Ambulation Distance (Feet) 730 Feet   Assistive device Straight cane   Gait Pattern Decreased dorsiflexion - left;Poor foot clearance - left;Decreased step length - left;Decreased hip/knee flexion - left;Left foot flat   Ambulation Surface Level;Indoor     High Level Balance   High Level Balance Comments R SLS = 8.44 secs:  L SLS 3.66 secs     Lumbar Exercises: Stretches   Active Hamstring Stretch 1 rep;30 seconds     Lumbar Exercises: Standing   Heel Raises 10 reps   Functional Squats 10 reps  on blue Airex   Other Standing Lumbar Exercises Pt performed standing hip extension, abduction and flexion with 2# weight   x 10 reps each leg  bil. LE's - no weight on RLE     Knee/Hip Exercises: Stretches   Gastroc Stretch 1 rep;30 seconds;Left   Other Knee/Hip Stretches Runner's stretch - LLE - 30 sec hold in standing     Knee/Hip Exercises: Aerobic   Recumbent Bike Scifit level 1.5 x 5" with UE's and LE's     Knee/Hip Exercises: Machines for Strengthening   Cybex Leg Press 40# bil. LE's 3 sets 10 reps     Knee/Hip Exercises: Standing   Hip Flexion Stengthening;Left;1 set;10 reps  2# weight on LLE - to 1st, 2nd step 5 reps each   Forward Step Up Left;1 set;10 reps;Step Height: 6"   Step Down Left;1 set;10 reps;Step Height: 6"   Functional Squat 1 set;10 reps  on BOSU                  PT Short Term Goals - 06/29/15 0855      PT SHORT TERM GOAL #1   Title Increase gait velocity to >/= 2.60 ft/sec with cane  for incr. gait efficiency.  (06-09-15)   Baseline 2.43 === 13.5 secs   Status Not Met     PT SHORT TERM GOAL #2   Title Pt will report at least 25% improvement in ambulation with incr. ease with L foot clearance.  (06-09-15)   Baseline fluctuates based on edema- 06-29-15   Status Partially Met     PT SHORT TERM GOAL #  3   Title Incr. amb. distance to at least 300' in 2" 10 secs  (06-07-15)   Baseline 360' with cane  - 06-29-15   Status Achieved     PT SHORT TERM GOAL #5   Title Independent in HEP for  LLE strengthening exercises.  (06-09-15)   Status Achieved           PT Long Term Goals - 01-17-2016 1627      PT LONG TERM GOAL #1   Title Independent in updated HEP for LLE strengthening and stretching (07-09-15)   Baseline Extend LTG's til 10-12-15 -- met 11-02-15   Status Achieved     PT LONG TERM GOAL #2   Title Report at least 50% improvement in LLE flexibility and ease of movement (6-16--17)/ 11-06-15/ 01-27-16  EXTEND LTG til 01-27-16   Baseline remains inconsistently met -  11-02-15   Status On-going     PT LONG TERM GOAL #4   Title Incr. gait velocity to >/= 2.9 ft/sec with cane for incr. gait efficiency.  (07-09-15)/ 11-06-15/ 01-27-16   Baseline 2.49 ft/sec with cane   Status On-going     PT LONG TERM GOAL #5   Title Amb. 6" nonstop with cane to demo incr. endurance/activity tolerance.  (07-09-15)/ 10-12-15/ 11-06-15; increase distance in 6" walk test to >/= 775' with cane for increased gait efficiency (01-27-16)                                    Baseline (200' x 3 = 600') nonstop with cane = met 11-02-15; 730' with cane on 17-Jan-2016   Status Revised     PT LONG TERM GOAL #7   Title Perform SLS on LLE >/= 5 secs to demo improved balance.  (12-03-15)/ 01-27-16   Baseline 3.47 secs; 3.66 secs on 2016-01-17   Status On-going     PT LONG TERM GOAL #8   Title Transfer floor to stand with UE support with SBA/verbal cues.  (12-03-15)/ 01-27-16   Status On-going             Patient  will benefit from skilled therapeutic intervention in order to improve the following deficits and impairments:     Visit Diagnosis: Other abnormalities of gait and mobility - Plan: PT plan of care cert/re-cert  Muscle weakness (generalized) - Plan: PT plan of care cert/re-cert       G-Codes - 01-17-2016 1633    Functional Assessment Tool Used Gait velocity 2.49 ft/sec with cane; L SLS 3.66 secs:  6" walk test 730' with cane   Functional Limitation Mobility: Walking and moving around   Mobility: Walking and Moving Around Current Status [phone]) At least 20 percent but less than 40 percent impaired, limited or restricted   Mobility: Walking and Moving Around Goal Status [phone]) At least 1 percent but less than 20 percent impaired, limited or restricted      Problem List Patient Active Problem List   Diagnosis Date Noted  . 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 Aneurysm, cerebral, nonruptured 05/31/2015  . Dizziness and giddiness 05/31/2015  . Altered mental status   . Acute CVA (cerebrovascular accident) ([place]) 03/15/2015  . Dysphasia 03/12/2015  . Thrombocytopenia ([place]) 03/12/2015  . Hypokalemia   . Endotracheally intubated   . Essential hypertension   . Intracranial aneurysm 11/18/2014  . Respiratory failure ([place])   . Cerebral aneurysm   . Brain  aneurysm   . Aphasia   . Palmar erythema   . CVA (cerebral infarction) 05/02/2014  . Aneurysm ([place]) 05/02/2014  . Expressive aphasia 05/02/2014  . 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 Tobacco abuse 10/24/2010  . TIA (transient ischemic attack)   . Hypertension   . Ventricular hypertrophy   . CVA 02/23/2010  . STRESS FRACTURE, FOOT 02/22/2010    Alda Lea, PT 12/27/2015, 4:39 PM  [facility] [address] [place], [place], [address] Phone: [phone]   Fax:  [phone]  Name: Jeanne Lawson MRN:  [id number] Date of Birth: 1945-03-27

## 2016-01-03 ENCOUNTER — Ambulatory Visit: Payer: Medicare Other | Admitting: Physical Therapy

## 2016-01-06 ENCOUNTER — Ambulatory Visit: Payer: Medicare Other | Admitting: Physical Therapy

## 2016-01-06 DIAGNOSIS — R2689 Other abnormalities of gait and mobility: Secondary | ICD-10-CM

## 2016-01-06 DIAGNOSIS — M6281 Muscle weakness (generalized): Secondary | ICD-10-CM

## 2016-01-06 NOTE — Therapy (Signed)
[facility] [address] [place], [place], [address] Phone: [phone]   Fax:  [phone]  Physical Therapy Treatment  Patient Details  Name: Jeanne Lawson MRN: [id number] Date of Birth: 1945/03/10 Referring Provider: Dr. [name]  Encounter Date: 01/06/2016      PT End of Session - 01/06/16 2021    Visit Number 17   Number of Visits 19   Date for PT Re-Evaluation 02/23/16   Authorization Type UHC Medicare   Authorization Time Period 12-27-15 - 02-23-16   PT Start Time 1022   PT Stop Time 1103   PT Time Calculation (min) 41 min      Past Medical History:  Diagnosis Date  . COPD (chronic obstructive pulmonary disease) ([place])   . Coronary artery disease 1996   Known with prior mild lesion of LAD demonstrated by Cardiac Catheterization in 1996  . Edema of foot    She has a history of chronic edema of the left dated back to age 57 when she suufered severe frostbite playing  on the snow as a child  . Hypertension   . PAC (premature atrial contraction)   . Pancreatitis    x2  . PONV (postoperative nausea and vomiting)    problems waking up last time 4/16 only time  . 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  Marland Kitchen Shortness of breath dyspnea    since stroke 2 months ago -  . 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 TIA (transient ischemic attack)    She was hospitalized 04-23-09 through 04-27-09 for involving right side of the body  . Tobacco abuse    Ongoing   . Ventricular hypertrophy 04/2009   LVH with diastolic dysfunction by echo. Has normal EF.    Past Surgical History:  Procedure Laterality Date  . ABDOMINAL HYSTERECTOMY    . APPENDECTOMY    . CARDIAC CATHETERIZATION  1996   Mild CAD with vasospasm  . CARDIOVASCULAR  STRESS TEST  12-02-2001   EF 70%  . ENDARTERECTOMY Right 08/12/2014   Procedure: RIGHT  COMMON CAROTID ARTERY EXPOSURE FOR INTERVENTIONAL RADIOLOGY PROCEDURE BY DR.DEVASHWAR,Insertion 6 FR sheath;  Surgeon: Elam Dutch, MD;  Location: [place];  Service: Vascular;  Laterality: Right;  . ENDARTERECTOMY Right 08/12/2014   Procedure:  CAROTID  EXPOSURE CLOSURE RIGHT NECK, REPAIR RIGHT COMMON CAROTID ARTERY;  Surgeon: Elam Dutch, MD;  Location: [place];  Service: Vascular;  Laterality: Right;  . ENDARTERECTOMY Left 11/18/2014   Procedure: CAROTID EXPOSURE;  Surgeon: Elam Dutch, MD;  Location: [place];  Service: Vascular;  Laterality: Left;  . ENDARTERECTOMY Left 11/18/2014   Procedure: CLOSURE CAROTID;  Surgeon: Elam Dutch, MD;  Location: [place];  Service: Vascular;  Laterality: Left;  . LAPAROSCOPY    . NECK SURGERY  50 yrs ago   Left side tumor  . RADIOLOGY WITH ANESTHESIA N/A 05/25/2014   Procedure: RADIOLOGY WITH ANESTHESIA;  Surgeon: [name], MD;  Location: [place];  Service: Radiology;  Laterality: N/A;  . RADIOLOGY WITH ANESTHESIA N/A 08/12/2014   Procedure: RADIOLOGY WITH ANESTHESIA;  Surgeon: [name], MD;  Location: [facility];  Service: Radiology;  Laterality: N/A;  . RADIOLOGY WITH ANESTHESIA N/A 03/15/2015   Procedure: MRI  OF BRAIN WITHOUT CONTRAST;  Surgeon: Medication Radiologist, MD;  Location: [place];  Service: Radiology;  Laterality: N/A;  DR. TAT/MRI  . US ECHOCARDIOGRAPHY  04-26-2009   EF 65-70%  . VESICOVAGINAL FISTULA CLOSURE W/ TAH  25 yrs ago    There were no vitals filed for this visit.      Subjective Assessment - 01/06/16 2013    Subjective Pt reports she has had some difficulty with L foot clearing floor due to swelling - states she has to really think about lifting it up to get it to clear   Pertinent History h/o old R CVA in 2010; TIA 04-23-09 - 04-27-09:  CAD:  COPD:  chronic edema in L foot dated back to age 68: saccular aneurysm, paroxysmal  atrial fibrillation, known cerebral aneurysms by MRA in 2011; ventricular hypertrophy:  HTN   Diagnostic tests MRI on 03-17-15; previous MRI's due to previous CVA   Patient Stated Goals increase LLE strength and walk better   Currently in Pain? No/denies                         [facility] Adult PT Treatment/Exercise - 01/06/16 1032      Ambulation/Gait   Ambulation/Gait Yes   Ambulation/Gait Assistance 5: Supervision   Ambulation/Gait Assistance Details cues to place L heel down first in stance to assist with increasing L dorsiflexion and subsequent incr. foot clearance   Ambulation Distance (Feet) 125 Feet   Assistive device Straight cane   Gait Pattern Decreased dorsiflexion - left;Poor foot clearance - left;Decreased step length - left;Decreased hip/knee flexion - left;Left foot flat   Ambulation Surface Level;Indoor     Lumbar Exercises: Stretches   Active Hamstring Stretch 1 rep;30 seconds     Lumbar Exercises: Standing   Heel Raises 10 reps   Functional Squats 10 reps  on blue Airex     Knee/Hip Exercises: Stretches   Gastroc Stretch 1 rep;30 seconds;Left   Other Knee/Hip Stretches Runner's stretch - LLE - 30 sec hold in standing     Knee/Hip Exercises: Machines for Strengthening   Cybex Leg Press 30# bil. LE's 3 sets 10 reps     Knee/Hip Exercises: Standing   Forward Step Up Left;1 set;10 reps;Step Height: 6"   Step Down Left;1 set;10 reps;Step Height: 6"   Other Standing Knee Exercises sidestepping inside // bars 10' x 4 reps with knees flexed on tiptoes; 2 reps forward and then backward with knees flexed and on tip toes with UE support prn       NeuroRe-ed: standing on BOSU - moving RLE up/back and laterally x 10 reps for improved L SLS Marching in place with lifting opposite UE to improve coordination  Standing - L hip flexion- L knee extension - then knee flexion and return to standing position for improved balance and LLE Control and  coordination Stepping over and back of black balance beam with minimal UE support inside // bars - 10 reps on each leg  Tap ups to 2nd step with 2# weight on LLE - 10 reps - with UE support prn       PT Education - 01/06/16 2020    Education provided Yes   Education Details educated pt in increasing initial heel contact of LLE to assist with increasing L dorsiflexion/ foot clearance    Person(s) Educated Patient   Methods Explanation;Demonstration   Comprehension Verbalized understanding;Returned demonstration  PT Short Term Goals - 06/29/15 0855      PT SHORT TERM GOAL #1   Title Increase gait velocity to >/= 2.60 ft/sec with cane for incr. gait efficiency.  (06-09-15)   Baseline 2.43 === 13.5 secs   Status Not Met     PT SHORT TERM GOAL #2   Title Pt will report at least 25% improvement in ambulation with incr. ease with L foot clearance.  (06-09-15)   Baseline fluctuates based on edema- 06-29-15   Status Partially Met     PT SHORT TERM GOAL #3   Title Incr. amb. distance to at least 300' in 2" 10 secs  (06-07-15)   Baseline 360' with cane  - 06-29-15   Status Achieved     PT SHORT TERM GOAL #5   Title Independent in HEP for  LLE strengthening exercises.  (06-09-15)   Status Achieved           PT Long Term Goals - 12/27/15 1627      PT LONG TERM GOAL #1   Title Independent in updated HEP for LLE strengthening and stretching (07-09-15)   Baseline Extend LTG's til 10-12-15 -- met 11-02-15   Status Achieved     PT LONG TERM GOAL #2   Title Report at least 50% improvement in LLE flexibility and ease of movement (6-16--17)/ 11-06-15/ 01-27-16  EXTEND LTG til 01-27-16   Baseline remains inconsistently met -  11-02-15   Status On-going     PT LONG TERM GOAL #4   Title Incr. gait velocity to >/= 2.9 ft/sec with cane for incr. gait efficiency.  (07-09-15)/ 11-06-15/ 01-27-16   Baseline 2.49 ft/sec with cane   Status On-going     PT LONG TERM GOAL #5   Title Amb. 6"  nonstop with cane to demo incr. endurance/activity tolerance.  (07-09-15)/ 10-12-15/ 11-06-15; increase distance in 6" walk test to >/= 775' with cane for increased gait efficiency (01-27-16)                                    Baseline (200' x 3 = 600') nonstop with cane = met 11-02-15; 730' with cane on 12-27-15   Status Revised     PT LONG TERM GOAL #7   Title Perform SLS on LLE >/= 5 secs to demo improved balance.  (12-03-15)/ 01-27-16   Baseline 3.47 secs; 3.66 secs on 12-27-15   Status On-going     PT LONG TERM GOAL #8   Title Transfer floor to stand with UE support with SBA/verbal cues.  (12-03-15)/ 01-27-16   Status On-going               Plan - 01/06/16 2022    Clinical Impression Statement Pt had increased edema in L foot and ankle today which significantly impacted L foot clearance in swing phase of gait:  clearance improved with pt achieving increased initial heel contact in stance phase of gait   Rehab Potential Good   PT Frequency 1x / week   PT Duration 4 weeks   PT Treatment/Interventions ADLs/Self Care Home Management;Therapeutic exercise;Therapeutic activities;Functional mobility training;Stair training;Gait training;DME Instruction;Balance training;Neuromuscular re-education;Patient/family education;Orthotic Fit/Training;Passive range of motion   PT Next Visit Plan cont LLE strengthening   PT Home Exercise Plan Stretches and strengthening for LLE   Consulted and Agree with Plan of Care Patient      Patient will benefit from skilled  therapeutic intervention in order to improve the following deficits and impairments:  Abnormal gait, Cardiopulmonary status limiting activity, Decreased activity tolerance, Decreased balance, Decreased mobility, Decreased strength, Decreased knowledge of use of DME, Decreased endurance, Decreased coordination, Decreased range of motion, Increased edema, Impaired flexibility  Visit Diagnosis: Other abnormalities of gait and mobility  Muscle  weakness (generalized)     Problem List Patient Active Problem List   Diagnosis Date Noted  . 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 Aneurysm, cerebral, nonruptured 05/31/2015  . Dizziness and giddiness 05/31/2015  . Altered mental status   . Acute CVA (cerebrovascular accident) ([place]) 03/15/2015  . Dysphasia 03/12/2015  . Thrombocytopenia ([place]) 03/12/2015  . Hypokalemia   . Endotracheally intubated   . Essential hypertension   . Intracranial aneurysm 11/18/2014  . Respiratory failure ([place])   . Cerebral aneurysm   . Brain aneurysm   . Aphasia   . Palmar erythema   . CVA (cerebral infarction) 05/02/2014  . Aneurysm ([place]) 05/02/2014  . Expressive aphasia 05/02/2014  . 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 Tobacco abuse 10/24/2010  . TIA (transient ischemic attack)   . Hypertension   . Ventricular hypertrophy   . CVA 02/23/2010  . STRESS FRACTURE, FOOT 02/22/2010    Alda Lea, PT 01/06/2016, 8:28 PM  [facility] [address] [place], [place], [address] Phone: [phone]   Fax:  [phone]  Name: Jeanne Lawson MRN: [id number] Date of Birth: [birth year]

## 2016-01-10 ENCOUNTER — Ambulatory Visit: Payer: Medicare Other | Admitting: Physical Therapy

## 2016-01-10 DIAGNOSIS — R2689 Other abnormalities of gait and mobility: Secondary | ICD-10-CM

## 2016-01-10 DIAGNOSIS — M6281 Muscle weakness (generalized): Secondary | ICD-10-CM

## 2016-01-10 NOTE — Therapy (Signed)
[facility] [address] [place], [place], [address] Phone: [phone]   Fax:  [phone]  Physical Therapy Treatment  Patient Details  Name: Jeanne Lawson MRN: [id number] Date of Birth: [birth year] Referring Provider: Dr. [name]  Encounter Date: 01/10/2016      PT End of Session - 01/10/16 2121    Visit Number 18   Number of Visits 19   Date for PT Re-Evaluation 02/23/16   Authorization Type [employer]   Authorization Time Period 12-27-15 - 02-23-16   PT Start Time 0803   PT Stop Time 0846   PT Time Calculation (min) 43 min      Past Medical History:  Diagnosis Date  . COPD (chronic obstructive pulmonary disease) ([place])   . Coronary artery disease 1996   Known with prior mild lesion of LAD demonstrated by Cardiac Catheterization in 1996  . Edema of foot    She has a history of chronic edema of the left dated back to age 3 when she suufered severe frostbite playing  on the snow as a child  . Hypertension   . PAC (premature atrial contraction)   . Pancreatitis    x2  . PONV (postoperative nausea and vomiting)    problems waking up last time 4/16 only time  . 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  Marland Kitchen Shortness of breath dyspnea    since stroke 2 months ago -  . 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 TIA (transient ischemic attack)    She was hospitalized 04-23-09 through 04-27-09 for involving right side of the body  . Tobacco abuse    Ongoing   . Ventricular hypertrophy 04/2009   LVH with diastolic dysfunction by echo. Has normal EF.    Past Surgical History:  Procedure Laterality Date  . ABDOMINAL HYSTERECTOMY    . APPENDECTOMY    . CARDIAC CATHETERIZATION  1996   Mild CAD with vasospasm  . CARDIOVASCULAR  STRESS TEST  12-02-2001   EF 70%  . ENDARTERECTOMY Right 08/12/2014   Procedure: RIGHT  COMMON CAROTID ARTERY EXPOSURE FOR INTERVENTIONAL RADIOLOGY PROCEDURE BY DR.DEVASHWAR,Insertion 6 FR sheath;  Surgeon: Elam Dutch, MD;  Location: [place];  Service: Vascular;  Laterality: Right;  . ENDARTERECTOMY Right 08/12/2014   Procedure:  CAROTID  EXPOSURE CLOSURE RIGHT NECK, REPAIR RIGHT COMMON CAROTID ARTERY;  Surgeon: Elam Dutch, MD;  Location: [place];  Service: Vascular;  Laterality: Right;  . ENDARTERECTOMY Left 11/18/2014   Procedure: CAROTID EXPOSURE;  Surgeon: Elam Dutch, MD;  Location: [facility];  Service: Vascular;  Laterality: Left;  . ENDARTERECTOMY Left 11/18/2014   Procedure: CLOSURE CAROTID;  Surgeon: Elam Dutch, MD;  Location: [place];  Service: Vascular;  Laterality: Left;  . LAPAROSCOPY    . NECK SURGERY  50 yrs ago   Left side tumor  . RADIOLOGY WITH ANESTHESIA N/A 05/25/2014   Procedure: RADIOLOGY WITH ANESTHESIA;  Surgeon: [name], MD;  Location: [facility];  Service: Radiology;  Laterality: N/A;  . RADIOLOGY WITH ANESTHESIA N/A 08/12/2014   Procedure: RADIOLOGY WITH ANESTHESIA;  Surgeon: [name], MD;  Location: [facility];  Service: Radiology;  Laterality: N/A;  . RADIOLOGY WITH ANESTHESIA N/A 03/15/2015   Procedure: MRI  OF BRAIN WITHOUT CONTRAST;  Surgeon: Medication Radiologist, MD;  Location: [facility];  Service: Radiology;  Laterality: N/A;  DR. TAT/MRI  . US ECHOCARDIOGRAPHY  04-26-2009   EF 65-70%  . VESICOVAGINAL FISTULA CLOSURE W/ TAH  25 yrs ago    There were no vitals filed for this visit.      Subjective Assessment - 01/10/16 2116    Subjective Pt reports she was very sore after treatment session last Thursday but says "but that was good"   Pertinent History h/o old R CVA in 2010; TIA 04-23-09 - 04-27-09:  CAD:  COPD:  chronic edema in L foot dated back to age 49: saccular aneurysm, paroxysmal atrial fibrillation, known cerebral aneurysms by MRA in 2011;  ventricular hypertrophy:  HTN   Diagnostic tests MRI on 03-17-15; previous MRI's due to previous CVA   Patient Stated Goals increase LLE strength and walk better   Currently in Pain? No/denies                         [facility] Adult PT Treatment/Exercise - 01/10/16 2117      Ambulation/Gait   Stairs Yes   Stairs Assistance 5: Supervision   Stair Management Technique One rail Right;Alternating pattern;Forwards   Number of Stairs 4   Height of Stairs 6     Lumbar Exercises: Stretches   Active Hamstring Stretch 1 rep;30 seconds     Lumbar Exercises: Aerobic   Stationary Bike SciFit level 1.5 x 5"     Lumbar Exercises: Standing   Heel Raises 10 reps   Functional Squats 10 reps  on inverted Bosu inside // bars     Lumbar Exercises: Supine   Bent Knee Raise 10 reps  LLE - with green theraband     Knee/Hip Exercises: Stretches   Gastroc Stretch 1 rep;30 seconds;Left   Other Knee/Hip Stretches Runner's stretch - LLE - 30 sec hold in standing     Knee/Hip Exercises: Machines for Strengthening   Cybex Leg Press 30# bil. LE's 3 sets 10 reps     Knee/Hip Exercises: Standing   Hip Flexion Stengthening;Left;1 set;10 reps  2# weight used on LLE - lifting over and back of bolster    Lateral Step Up Left;1 set;10 reps;Step Height: 6"   Forward Step Up Left;1 set;10 reps;Step Height: 6"   Step Down Left;1 set;10 reps;Step Height: 6"     Neuro Re-ed; stepping over and back of balance beam without UE support - 10 reps each leg for improved SLS  Standing on bosu - with minimal UE support - moving each leg up/back and laterally x 10 reps with SBA for improved SLS             PT Short Term Goals - 06/29/15 0855      PT SHORT TERM GOAL #1   Title Increase gait velocity to >/= 2.60 ft/sec with cane for incr. gait efficiency.  (06-09-15)   Baseline 2.43 === 13.5 secs   Status Not Met     PT SHORT TERM GOAL #2   Title Pt will report at least 25% improvement in  ambulation with incr. ease with L foot clearance.  (06-09-15)   Baseline fluctuates based on edema- 06-29-15   Status Partially Met     PT SHORT TERM GOAL #3   Title Incr. amb. distance to at least 300' in 2" 10 secs  (06-07-15)   Baseline 360' with cane  - 06-29-15  Status Achieved     PT SHORT TERM GOAL #5   Title Independent in HEP for  LLE strengthening exercises.  (06-09-15)   Status Achieved           PT Long Term Goals - 12/27/15 1627      PT LONG TERM GOAL #1   Title Independent in updated HEP for LLE strengthening and stretching (07-09-15)   Baseline Extend LTG's til 10-12-15 -- met 11-02-15   Status Achieved     PT LONG TERM GOAL #2   Title Report at least 50% improvement in LLE flexibility and ease of movement (6-16--17)/ 11-06-15/ 01-27-16  EXTEND LTG til 01-27-16   Baseline remains inconsistently met -  11-02-15   Status On-going     PT LONG TERM GOAL #4   Title Incr. gait velocity to >/= 2.9 ft/sec with cane for incr. gait efficiency.  (07-09-15)/ 11-06-15/ 01-27-16   Baseline 2.49 ft/sec with cane   Status On-going     PT LONG TERM GOAL #5   Title Amb. 6" nonstop with cane to demo incr. endurance/activity tolerance.  (07-09-15)/ 10-12-15/ 11-06-15; increase distance in 6" walk test to >/= 775' with cane for increased gait efficiency (01-27-16)                                    Baseline (200' x 3 = 600') nonstop with cane = met 11-02-15; 730' with cane on 12-27-15   Status Revised     PT LONG TERM GOAL #7   Title Perform SLS on LLE >/= 5 secs to demo improved balance.  (12-03-15)/ 01-27-16   Baseline 3.47 secs; 3.66 secs on 12-27-15   Status On-going     PT LONG TERM GOAL #8   Title Transfer floor to stand with UE support with SBA/verbal cues.  (12-03-15)/ 01-27-16   Status On-going               Plan - 01/10/16 2122    Clinical Impression Statement Pt improving with gait and balance; continues to have significant edema in LLE which impacts L foot clearance in swing  phase, however, this is improved with pt increasing initial heel contact LLE in stance phase of gait    Rehab Potential Good   PT Frequency 1x / week   PT Duration 4 weeks   PT Treatment/Interventions ADLs/Self Care Home Management;Therapeutic exercise;Therapeutic activities;Functional mobility training;Stair training;Gait training;DME Instruction;Balance training;Neuromuscular re-education;Patient/family education;Orthotic Fit/Training;Passive range of motion   PT Next Visit Plan cont LLE strengthening   PT Home Exercise Plan Stretches and strengthening for LLE   Consulted and Agree with Plan of Care Patient      Patient will benefit from skilled therapeutic intervention in order to improve the following deficits and impairments:  Abnormal gait, Cardiopulmonary status limiting activity, Decreased activity tolerance, Decreased balance, Decreased mobility, Decreased strength, Decreased knowledge of use of DME, Decreased endurance, Decreased coordination, Decreased range of motion, Increased edema, Impaired flexibility  Visit Diagnosis: Muscle weakness (generalized)  Other abnormalities of gait and mobility     Problem List Patient Active Problem List   Diagnosis Date Noted  . 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 Aneurysm, cerebral, nonruptured 05/31/2015  . Dizziness and giddiness 05/31/2015  . Altered mental status   . Acute CVA (cerebrovascular accident) ([place]) 03/15/2015  . Dysphasia 03/12/2015  . Thrombocytopenia ([place]) 03/12/2015  . Hypokalemia   . Endotracheally intubated   .  Essential hypertension   . Intracranial aneurysm 11/18/2014  . Respiratory failure ([place])   . Cerebral aneurysm   . Brain aneurysm   . Aphasia   . Palmar erythema   . CVA (cerebral infarction) 05/02/2014  . Aneurysm ([place]) 05/02/2014  . Expressive aphasia 05/02/2014  . 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 Tobacco abuse 10/24/2010  . TIA  (transient ischemic attack)   . Hypertension   . Ventricular hypertrophy   . CVA 02/23/2010  . STRESS FRACTURE, FOOT 02/22/2010    Alda Lea, PT 01/10/2016, 9:26 PM  [facility] [address] [place], [place], [address] Phone: [phone]   Fax:  [phone]  Name: Jeanne Lawson MRN: [id number] Date of Birth: [birth year]

## 2016-01-19 ENCOUNTER — Ambulatory Visit: Payer: Medicare Other | Admitting: Physical Therapy

## 2016-01-19 DIAGNOSIS — R2689 Other abnormalities of gait and mobility: Secondary | ICD-10-CM

## 2016-01-19 NOTE — Therapy (Signed)
[facility] [address] [place], [place], [address] Phone: [phone]   Fax:  [phone]  Patient Details  Name: Jeanne Lawson MRN: [id number] Date of Birth: [birth year] Referring Provider:  [name], MD  Encounter Date: 01/19/2016  Pt arrived for PT - stated that she had "pulled something in her back" and was unable to fully participate.  Pt placed on hold for 1 month and then will resume per pt's request. Pt stated that her back was feeling much better than it was last week but still continues to have pain.  Alda Lea, PT 01/19/2016, 8:35 AM  [facility] [address] [place], [place], [address] Phone: [phone]   Fax:  [phone]

## 2016-03-06 ENCOUNTER — Emergency Department (HOSPITAL_COMMUNITY): Payer: Medicare Other

## 2016-03-06 ENCOUNTER — Inpatient Hospital Stay (HOSPITAL_COMMUNITY)
Admission: EM | Admit: 2016-03-06 | Discharge: 2016-03-11 | DRG: 418 | Disposition: A | Discharge status: Home / Self Care | Payer: Medicare Other | Attending: General Surgery | Admitting: General Surgery

## 2016-03-06 ENCOUNTER — Encounter (HOSPITAL_COMMUNITY): Payer: Self-pay | Admitting: Family Medicine

## 2016-03-06 DIAGNOSIS — K821 Hydrops of gallbladder: Secondary | ICD-10-CM | POA: Diagnosis present

## 2016-03-06 DIAGNOSIS — E876 Hypokalemia: Secondary | ICD-10-CM | POA: Diagnosis not present

## 2016-03-06 DIAGNOSIS — K810 Acute cholecystitis: Secondary | ICD-10-CM | POA: Diagnosis present

## 2016-03-06 DIAGNOSIS — K819 Cholecystitis, unspecified: Secondary | ICD-10-CM

## 2016-03-06 DIAGNOSIS — K660 Peritoneal adhesions (postprocedural) (postinfection): Secondary | ICD-10-CM | POA: Diagnosis present

## 2016-03-06 DIAGNOSIS — I2510 Atherosclerotic heart disease of native coronary artery without angina pectoris: Secondary | ICD-10-CM | POA: Diagnosis present

## 2016-03-06 DIAGNOSIS — Z7982 Long term (current) use of aspirin: Secondary | ICD-10-CM

## 2016-03-06 DIAGNOSIS — R600 Localized edema: Secondary | ICD-10-CM | POA: Diagnosis present

## 2016-03-06 DIAGNOSIS — K8012 Calculus of gallbladder with acute and chronic cholecystitis without obstruction: Secondary | ICD-10-CM | POA: Diagnosis not present

## 2016-03-06 DIAGNOSIS — Z79899 Other long term (current) drug therapy: Secondary | ICD-10-CM

## 2016-03-06 DIAGNOSIS — J449 Chronic obstructive pulmonary disease, unspecified: Secondary | ICD-10-CM | POA: Diagnosis present

## 2016-03-06 DIAGNOSIS — Z7902 Long term (current) use of antithrombotics/antiplatelets: Secondary | ICD-10-CM

## 2016-03-06 DIAGNOSIS — E785 Hyperlipidemia, unspecified: Secondary | ICD-10-CM | POA: Diagnosis present

## 2016-03-06 DIAGNOSIS — I69354 Hemiplegia and hemiparesis following cerebral infarction affecting left non-dominant side: Secondary | ICD-10-CM

## 2016-03-06 DIAGNOSIS — I10 Essential (primary) hypertension: Secondary | ICD-10-CM | POA: Diagnosis present

## 2016-03-06 DIAGNOSIS — Z87891 Personal history of nicotine dependence: Secondary | ICD-10-CM

## 2016-03-06 LAB — COMPREHENSIVE METABOLIC PANEL WITH GFR
ALT: 13 U/L — ABNORMAL LOW (ref 14–54)
AST: 28 U/L (ref 15–41)
Albumin: 3.5 g/dL (ref 3.5–5.0)
Alkaline Phosphatase: 38 U/L (ref 38–126)
Anion gap: 16 — ABNORMAL HIGH (ref 5–15)
BUN: 13 mg/dL (ref 6–20)
CO2: 22 mmol/L (ref 22–32)
Calcium: 9 mg/dL (ref 8.9–10.3)
Chloride: 101 mmol/L (ref 101–111)
Creatinine, Ser: 0.79 mg/dL (ref 0.44–1.00)
GFR calc Af Amer: >60 mL/min
GFR calc non Af Amer: >60 mL/min
Glucose, Bld: 107 mg/dL — ABNORMAL HIGH (ref 65–99)
Potassium: 3.8 mmol/L (ref 3.5–5.1)
Sodium: 139 mmol/L (ref 135–145)
Total Bilirubin: 1.5 mg/dL — ABNORMAL HIGH (ref 0.3–1.2)
Total Protein: 6.4 g/dL — ABNORMAL LOW (ref 6.5–8.1)

## 2016-03-06 LAB — URINALYSIS, ROUTINE W REFLEX MICROSCOPIC
Bacteria, UA: NONE SEEN
Bilirubin Urine: NEGATIVE
GLUCOSE, UA: NEGATIVE mg/dL
KETONES UR: NEGATIVE mg/dL
LEUKOCYTES UA: NEGATIVE
NITRITE: NEGATIVE
PH: 7 (ref 5.0–8.0)
Protein, ur: 30 mg/dL — AB
Specific Gravity, Urine: 1.023 (ref 1.005–1.030)

## 2016-03-06 LAB — CBC
HCT: 36.5 % (ref 36.0–46.0)
Hemoglobin: 12.2 g/dL (ref 12.0–15.0)
MCH: 28 pg (ref 26.0–34.0)
MCHC: 33.4 g/dL (ref 30.0–36.0)
MCV: 83.7 fL (ref 78.0–100.0)
Platelets: 120 10*3/uL — ABNORMAL LOW (ref 150–400)
RBC: 4.36 MIL/uL (ref 3.87–5.11)
RDW: 15.3 % (ref 11.5–15.5)
WBC: 10.3 10*3/uL (ref 4.0–10.5)

## 2016-03-06 LAB — LIPASE, BLOOD: Lipase: 23 U/L (ref 11–51)

## 2016-03-06 MED ORDER — CIPROFLOXACIN HCL 500 MG PO TABS: 500.0000 mg | ORAL_TABLET | Freq: Once | ORAL | Status: DC

## 2016-03-06 MED ORDER — MORPHINE SULFATE (PF) 4 MG/ML IV SOLN
4.0000 mg | Freq: Once | INTRAVENOUS | Status: DC
  Filled 2016-03-06: qty 1

## 2016-03-06 MED ORDER — IOPAMIDOL (ISOVUE-300) INJECTION 61%
INTRAVENOUS | Status: AC
  Administered 2016-03-06: 100 mL
  Filled 2016-03-06: qty 100

## 2016-03-06 MED ORDER — SODIUM CHLORIDE 0.9 % IV BOLUS (SEPSIS)
1000.0000 mL | Freq: Once | INTRAVENOUS | Status: AC
  Administered 2016-03-06: 1000 mL via INTRAVENOUS

## 2016-03-06 MED ORDER — KETOROLAC TROMETHAMINE 15 MG/ML IJ SOLN
15.0000 mg | Freq: Once | INTRAMUSCULAR | Status: AC
Start: 2016-03-06 — End: 2016-03-06
  Administered 2016-03-06: 15 mg via INTRAVENOUS
  Filled 2016-03-06: qty 1

## 2016-03-06 MED ORDER — ONDANSETRON HCL 4 MG/2ML IJ SOLN
4.0000 mg | Freq: Once | INTRAMUSCULAR | Status: AC
  Administered 2016-03-06: 4 mg via INTRAVENOUS
  Filled 2016-03-06: qty 2

## 2016-03-06 MED ORDER — METRONIDAZOLE 500 MG PO TABS: 500.0000 mg | ORAL_TABLET | Freq: Once | ORAL | Status: DC

## 2016-03-06 NOTE — ED Provider Notes (Signed)
[facility] DEPT Provider Note   CSN: [id number] Arrival date & time: 03/06/16  1558     History   Chief Complaint Chief Complaint  Patient presents with  . Abdominal Pain    HPI Jeanne Lawson is a 71 y.o. female.  HPI Pt presents from home via POV with c/o RUQ abdominal pain since Saturday with 6 episodes of vomiting in last 24 hours. Flu r/o by PCP PTA.   Patient states her symptoms began 2 days ago. She states she felt chills denies abdominal pain. She denies any diarrhea, dysuria. She has not had a bowel movement in 2 days. She notes any history of abdominal surgeries. No chest pain or shortness of breath. No cough, congestion. She states her pain is severe. She has not taken anything that has relieved her symptoms.  No blood in vomit.   Past Medical History:  Diagnosis Date  . COPD (chronic obstructive pulmonary disease) ([place])   . Coronary artery disease 1996   Known with prior mild lesion of LAD demonstrated by Cardiac Catheterization in 1996  . Edema of foot    She has a history of chronic edema of the left dated back to age 42 when she suufered severe frostbite playing  on the snow as a child  . Hypertension   . PAC (premature atrial contraction)   . Pancreatitis    x2  . PONV (postoperative nausea and vomiting)    problems waking up last time 4/16 only time  . 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  Marland Kitchen Shortness of breath dyspnea    since stroke 2 months ago -  . 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 TIA (transient ischemic attack)    She was hospitalized 04-23-09 through 04-27-09 for involving right side of the body  . Tobacco abuse    Ongoing   . Ventricular hypertrophy 04/2009   LVH with diastolic dysfunction by echo. Has normal EF.    Patient Active Problem List   Diagnosis Date Noted  . 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 Aneurysm, cerebral, nonruptured 05/31/2015  . Dizziness and giddiness 05/31/2015  . Altered mental status   . Acute CVA (cerebrovascular accident) ([place]) 03/15/2015  . Dysphasia 03/12/2015  . Thrombocytopenia ([place]) 03/12/2015  . Hypokalemia   . Endotracheally intubated   . Essential hypertension   . Intracranial aneurysm 11/18/2014  . Respiratory failure ([place])   . Cerebral aneurysm   . Brain aneurysm   . Aphasia   . Palmar erythema   . CVA (cerebral infarction) 05/02/2014  . Aneurysm ([place]) 05/02/2014  . Expressive aphasia 05/02/2014  . 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 Tobacco abuse 10/24/2010  . TIA (transient ischemic attack)   . Hypertension   . Ventricular hypertrophy   . CVA 02/23/2010  . STRESS FRACTURE, FOOT 02/22/2010    Past Surgical History:  Procedure Laterality Date  . ABDOMINAL HYSTERECTOMY    . APPENDECTOMY    . CARDIAC CATHETERIZATION  1996   Mild CAD with vasospasm  . CARDIOVASCULAR STRESS TEST  12-02-2001   EF 70%  . ENDARTERECTOMY Right 08/12/2014   Procedure: RIGHT  COMMON CAROTID ARTERY EXPOSURE FOR INTERVENTIONAL RADIOLOGY PROCEDURE BY DR.DEVASHWAR,Insertion 6 FR sheath;  Surgeon: Juanda Crumble  Antony Blackbird, MD;  Location: [facility];  Service: Vascular;  Laterality: Right;  . ENDARTERECTOMY Right 08/12/2014   Procedure:  CAROTID  EXPOSURE CLOSURE RIGHT NECK, REPAIR RIGHT COMMON CAROTID ARTERY;  Surgeon: Elam Dutch, MD;  Location: [facility];  Service: Vascular;  Laterality: Right;  . ENDARTERECTOMY Left 11/18/2014   Procedure: CAROTID EXPOSURE;  Surgeon: Elam Dutch, MD;  Location: [facility];  Service: Vascular;  Laterality: Left;  . ENDARTERECTOMY Left 11/18/2014   Procedure: CLOSURE CAROTID;  Surgeon: Elam Dutch, MD;  Location: [facility];  Service: Vascular;  Laterality: Left;  . LAPAROSCOPY    . NECK SURGERY  50 yrs ago   Left side tumor  .  RADIOLOGY WITH ANESTHESIA N/A 05/25/2014   Procedure: RADIOLOGY WITH ANESTHESIA;  Surgeon: [name], MD;  Location: [facility];  Service: Radiology;  Laterality: N/A;  . RADIOLOGY WITH ANESTHESIA N/A 08/12/2014   Procedure: RADIOLOGY WITH ANESTHESIA;  Surgeon: [name], MD;  Location: [facility];  Service: Radiology;  Laterality: N/A;  . RADIOLOGY WITH ANESTHESIA N/A 03/15/2015   Procedure: MRI OF BRAIN WITHOUT CONTRAST;  Surgeon: Medication Radiologist, MD;  Location: [place];  Service: Radiology;  Laterality: N/A;  DR. TAT/MRI  . US ECHOCARDIOGRAPHY  04-26-2009   EF 65-70%  . VESICOVAGINAL FISTULA CLOSURE W/ TAH  25 yrs ago    OB History    No data available       Home Medications    Prior to Admission medications   Medication Sig Start Date End Date Taking? Authorizing Provider  albuterol (PROVENTIL HFA;VENTOLIN HFA) 108 (90 BASE) MCG/ACT inhaler Inhale 1-2 puffs into the lungs every 6 (six) hours as needed for wheezing or shortness of breath.   Yes Historical Provider, MD  aspirin EC 81 MG tablet Take 81 mg by mouth daily at 12 noon.   Yes Historical Provider, MD  atorvastatin (LIPITOR) 40 MG tablet Take 40 mg by mouth daily after supper. 02/22/16  Yes Historical Provider, MD  clopidogrel (PLAVIX) 75 MG tablet Take 0.5 tablets (37.5 mg total) by mouth daily. Patient taking differently: Take 37.5 mg by mouth every other day.  10/29/15  Yes Dorothy Spark, MD  lisinopril-hydrochlorothiazide (PRINZIDE,ZESTORETIC) 20-12.5 MG tablet Take 1 tablet by mouth daily. Patient taking differently: Take 1 tablet by mouth daily after lunch.  10/29/15  Yes Dorothy Spark, MD  TOPROL XL 50 MG 24 hr tablet Take 25 mg by mouth twice a day with or immediately following a meal. Patient taking differently: Take 25 mg by mouth 2 (two) times daily with a meal.  10/29/15  Yes Dorothy Spark, MD    Family History Family History  Problem Relation Age of Onset  . Emphysema Father   . Diabetes Father    . Aneurysm Sister   . Stroke Mother   . Hypertension Mother   . Heart disease Mother   . Hypertension Daughter   . Thyroid disease Daughter     Social History Social History  Substance Use Topics  . Smoking status: Former Smoker    Packs/day: 1.00    Years: 30.00    Types: Cigarettes    Quit date: 04/26/2014  . Smokeless tobacco: Never Used  . Alcohol use No     Allergies   Dilaudid [hydromorphone]; Codeine; Hydromorphone hcl; Latex; and Sulfa drugs cross reactors   Review of Systems Review of Systems  Constitutional: Positive for chills.  Allergic/Immunologic: Positive for immunocompromised state.  All other systems reviewed and  are negative.    Physical Exam Updated Vital Signs BP 134/81   Pulse 82   Temp 100 F (37.8 C) (Oral)   Resp 26   Ht 5\' 1"  (1.549 m)   Wt 70.8 kg   SpO2 100%   BMI 29.48 kg/m   Physical Exam  Constitutional: She is oriented to person, place, and time. She appears well-developed and well-nourished. No distress.  HENT:  Head: Normocephalic and atraumatic.  Eyes: Conjunctivae are normal. Right eye exhibits no discharge. Left eye exhibits no discharge.  Neck: Normal range of motion. Neck supple.  Cardiovascular: Normal rate and regular rhythm.   Pulmonary/Chest: Effort normal and breath sounds normal. No respiratory distress.  Abdominal: Soft. Bowel sounds are normal. She exhibits no distension and no mass. There is tenderness (diffusely). There is no rebound and no guarding. No hernia.  Neg cvat Neg murphys sign  Musculoskeletal: She exhibits no edema.  Neurological: She is alert and oriented to person, place, and time.  Skin: Skin is warm. No rash noted.  Psychiatric: She has a normal mood and affect.  Nursing note and vitals reviewed.    ED Treatments / Results  Labs (all labs ordered are listed, but only abnormal results are displayed) Labs Reviewed  COMPREHENSIVE METABOLIC PANEL - Abnormal; Notable for the following:        Result Value   Glucose, Bld 107 (*)    Total Protein 6.4 (*)    ALT 13 (*)    Total Bilirubin 1.5 (*)    Anion gap 16 (*)    All other components within normal limits  CBC - Abnormal; Notable for the following:    Platelets 120 (*)    All other components within normal limits  URINALYSIS, ROUTINE W REFLEX MICROSCOPIC - Abnormal; Notable for the following:    APPearance HAZY (*)    Hgb urine dipstick SMALL (*)    Protein, ur 30 (*)    Squamous Epithelial / LPF 6-30 (*)    All other components within normal limits  LIPASE, BLOOD    EKG  EKG Interpretation None       Radiology US Abdomen Complete  Result Date: 03/06/2016 CLINICAL DATA:  Initial evaluation for elevated bilirubin with nausea, vomiting, and right upper quadrant pain. EXAM: ABDOMEN ULTRASOUND COMPLETE COMPARISON:  None. FINDINGS: Gallbladder: No gallstones or wall thickening visualized. No sonographic Murphy sign noted by sonographer. Common bile duct: Diameter: 5 mm Liver: No focal lesion identified. Increased echogenicity, suggestive of steatosis. IVC: No abnormality visualized. Pancreas: Visualized portion unremarkable. Spleen: Size and appearance within normal limits. Right Kidney: Length: 10.7 cm. Echogenicity within normal limits. No mass or hydronephrosis visualized. Left Kidney: Length: 12.0 cm. Echogenicity within normal limits. No mass or hydronephrosis visualized. Cortical thinning at the upper pole noted. Abdominal aorta: No aneurysm visualized. Other findings: None. IMPRESSION: 1. Normal sonographic appearance of the gallbladder. No evidence for cholelithiasis, acute cholecystitis, or biliary dilatation. 2. Hepatic steatosis. 3. No other acute abnormality within the abdomen. Electronically Signed   By: Jeannine Boga M.D.   On: 03/06/2016 21:41   Ct Abdomen Pelvis W Contrast  Result Date: 03/07/2016 CLINICAL DATA:  Acute onset of generalized abdominal pain, nausea and vomiting. Initial encounter. EXAM:  CT ABDOMEN AND PELVIS WITH CONTRAST TECHNIQUE: Multidetector CT imaging of the abdomen and pelvis was performed using the standard protocol following bolus administration of intravenous contrast. CONTRAST:  100 mL Prelim ISOVUE-300 IOPAMIDOL (ISOVUE-300) INJECTION 61% COMPARISON:  Abdominal ultrasound performed earlier today  at 9:19 p.m. FINDINGS: Lower chest: Mild calcification is noted at the mitral valve. The heart is mildly enlarged. The visualized lung bases are grossly clear. Hepatobiliary: Trace ascites is seen tracking about the liver. The liver is unremarkable in appearance. Note is made of diffuse soft tissue inflammation about the gallbladder, with associated wall thickening, concerning for acute cholecystitis. Mild soft tissue inflammation extends about the adjacent hepatic flexure of the colon. The common bile duct remains normal in caliber. No definite stones are characterized on CT. Pancreas: The pancreas is within normal limits. Spleen: The spleen is mildly enlarged, measuring 13.9 cm in length. Adrenals/Urinary Tract: The adrenal glands are unremarkable in appearance. The kidneys are within normal limits. There is no evidence of hydronephrosis. No renal or ureteral stones are identified. No perinephric stranding is seen. Stomach/Bowel: The stomach is unremarkable in appearance. The small bowel is within normal limits. The patient is status post appendectomy. Minimal diverticulosis is noted along the proximal sigmoid colon, without evidence of diverticulitis. Trace fluid within the pelvis may be physiologic in nature, or may reflect the acute cholecystitis. Vascular/Lymphatic: Scattered calcification is seen along the abdominal aorta and its branches. The abdominal aorta is otherwise grossly unremarkable. The inferior vena cava is grossly unremarkable. No retroperitoneal lymphadenopathy is seen. No pelvic sidewall lymphadenopathy is identified. Reproductive: The bladder is mildly distended and  grossly unremarkable. The patient is status post hysterectomy. No suspicious adnexal masses are seen. The left ovary is unremarkable in appearance. Other: No additional soft tissue abnormalities are seen. Musculoskeletal: No acute osseous abnormalities are identified. The visualized musculature is unremarkable in appearance. IMPRESSION: 1. Acute cholecystitis, with diffuse soft tissue inflammation about the gallbladder and associated wall thickening. Trace associated ascites seen tracking about the liver. Mild soft tissue inflammation extends about the adjacent hepatic flexure of the colon. No definite stones characterized on CT, though evaluation for stones is limited on CT. 2. Mild splenomegaly. 3. Minimal diverticulosis along the proximal sigmoid colon, without evidence of diverticulitis. 4. Mild cardiomegaly.  Mild calcification at the mitral valve. 5. Scattered aortic atherosclerosis. Electronically Signed   By: Garald Balding M.D.   On: 03/07/2016 00:29    Procedures Procedures (including critical care time)  Medications Ordered in ED Medications  ciprofloxacin (CIPRO) IVPB 400 mg (not administered)  metroNIDAZOLE (FLAGYL) IVPB 500 mg (not administered)  sodium chloride 0.9 % bolus 1,000 mL (0 mLs Intravenous Stopped 03/06/16 2357)  ondansetron (ZOFRAN) injection 4 mg (4 mg Intravenous Given 03/06/16 2245)  ketorolac (TORADOL) 15 MG/ML injection 15 mg (15 mg Intravenous Given 03/06/16 2259)  iopamidol (ISOVUE-300) 61 % injection (100 mLs  Contrast Given 03/06/16 2340)     Initial Impression / Assessment and Plan / ED Course  I have reviewed the triage vital signs and the nursing notes.  Pertinent labs & imaging results that were available during my care of the patient were reviewed by me and considered in my medical decision making (see chart for details).     Initial ultrasound negative for cholecystitis, cholelithiasis, choledocholithiasis. However, on reexamination, patient continues to  have significant tenderness and had fever. No leukocytosis, but white count is increasing from baseline. UA is negative. Lipase negative. CT abdomen obtained which shows cholecystitis. Spoke with EGS for admission. IV abx given, pt and family updated.   Final Clinical Impressions(s) / ED Diagnoses   Final diagnoses:  Cholecystitis    New Prescriptions New Prescriptions   No medications on file     Karma Greaser, MD  03/07/16 [place], MD 03/07/16 [phone]

## 2016-03-06 NOTE — ED Triage Notes (Signed)
Pt presents from home via POV with c/o RUQ abdominal pain since Saturday with 6 episodes of vomiting in last 24 hours. Flu r/o by PCP PTA.

## 2016-03-06 NOTE — ED Notes (Signed)
Staff has attempted blood work several times at triage with no success. Will call phleb.

## 2016-03-07 DIAGNOSIS — E785 Hyperlipidemia, unspecified: Secondary | ICD-10-CM | POA: Diagnosis present

## 2016-03-07 DIAGNOSIS — K8012 Calculus of gallbladder with acute and chronic cholecystitis without obstruction: Secondary | ICD-10-CM | POA: Diagnosis present

## 2016-03-07 DIAGNOSIS — I671 Cerebral aneurysm, nonruptured: Secondary | ICD-10-CM | POA: Diagnosis not present

## 2016-03-07 DIAGNOSIS — K660 Peritoneal adhesions (postprocedural) (postinfection): Secondary | ICD-10-CM | POA: Diagnosis present

## 2016-03-07 DIAGNOSIS — I69354 Hemiplegia and hemiparesis following cerebral infarction affecting left non-dominant side: Secondary | ICD-10-CM | POA: Diagnosis not present

## 2016-03-07 DIAGNOSIS — Z7902 Long term (current) use of antithrombotics/antiplatelets: Secondary | ICD-10-CM | POA: Diagnosis not present

## 2016-03-07 DIAGNOSIS — J449 Chronic obstructive pulmonary disease, unspecified: Secondary | ICD-10-CM | POA: Diagnosis present

## 2016-03-07 DIAGNOSIS — I2510 Atherosclerotic heart disease of native coronary artery without angina pectoris: Secondary | ICD-10-CM | POA: Diagnosis present

## 2016-03-07 DIAGNOSIS — K821 Hydrops of gallbladder: Secondary | ICD-10-CM | POA: Diagnosis present

## 2016-03-07 DIAGNOSIS — K819 Cholecystitis, unspecified: Secondary | ICD-10-CM | POA: Diagnosis present

## 2016-03-07 DIAGNOSIS — K810 Acute cholecystitis: Secondary | ICD-10-CM | POA: Diagnosis not present

## 2016-03-07 DIAGNOSIS — I10 Essential (primary) hypertension: Secondary | ICD-10-CM | POA: Diagnosis present

## 2016-03-07 DIAGNOSIS — Z79899 Other long term (current) drug therapy: Secondary | ICD-10-CM | POA: Diagnosis not present

## 2016-03-07 DIAGNOSIS — R600 Localized edema: Secondary | ICD-10-CM | POA: Diagnosis present

## 2016-03-07 DIAGNOSIS — Z8673 Personal history of transient ischemic attack (TIA), and cerebral infarction without residual deficits: Secondary | ICD-10-CM | POA: Diagnosis not present

## 2016-03-07 DIAGNOSIS — E876 Hypokalemia: Secondary | ICD-10-CM | POA: Diagnosis not present

## 2016-03-07 DIAGNOSIS — Z87891 Personal history of nicotine dependence: Secondary | ICD-10-CM | POA: Diagnosis not present

## 2016-03-07 DIAGNOSIS — Z7982 Long term (current) use of aspirin: Secondary | ICD-10-CM | POA: Diagnosis not present

## 2016-03-07 LAB — COMPREHENSIVE METABOLIC PANEL
ALK PHOS: 31 U/L — AB (ref 38–126)
ALT: 11 U/L — AB (ref 14–54)
AST: 17 U/L (ref 15–41)
Albumin: 2.9 g/dL — ABNORMAL LOW (ref 3.5–5.0)
Anion gap: 9 (ref 5–15)
BUN: 13 mg/dL (ref 6–20)
CALCIUM: 7.9 mg/dL — AB (ref 8.9–10.3)
CHLORIDE: 103 mmol/L (ref 101–111)
CO2: 25 mmol/L (ref 22–32)
CREATININE: 0.78 mg/dL (ref 0.44–1.00)
GFR calc Af Amer: >60 mL/min (ref >60)
Glucose, Bld: 119 mg/dL — ABNORMAL HIGH (ref 65–99)
Potassium: 3 mmol/L — ABNORMAL LOW (ref 3.5–5.1)
SODIUM: 137 mmol/L (ref 135–145)
Total Bilirubin: 1.5 mg/dL — ABNORMAL HIGH (ref 0.3–1.2)
Total Protein: 5.7 g/dL — ABNORMAL LOW (ref 6.5–8.1)

## 2016-03-07 LAB — CBC
HCT: 30.9 % — ABNORMAL LOW (ref 36.0–46.0)
Hemoglobin: 10.1 g/dL — ABNORMAL LOW (ref 12.0–15.0)
MCH: 28 pg (ref 26.0–34.0)
MCHC: 32.7 g/dL (ref 30.0–36.0)
MCV: 85.6 fL (ref 78.0–100.0)
PLATELETS: 103 10*3/uL — AB (ref 150–400)
RBC: 3.61 MIL/uL — ABNORMAL LOW (ref 3.87–5.11)
RDW: 15.6 % — AB (ref 11.5–15.5)
WBC: 6.5 10*3/uL (ref 4.0–10.5)

## 2016-03-07 MED ORDER — ASPIRIN EC 81 MG PO TBEC
81.0000 mg | DELAYED_RELEASE_TABLET | Freq: Every day | ORAL | Status: AC
  Administered 2016-03-07 – 2016-03-08 (×2): 81 mg via ORAL
  Filled 2016-03-07 (×2): qty 1

## 2016-03-07 MED ORDER — ACETAMINOPHEN 650 MG RE SUPP: 650.0000 mg | Freq: Four times a day (QID) | RECTAL | Status: DC | PRN

## 2016-03-07 MED ORDER — ATORVASTATIN CALCIUM 40 MG PO TABS
40.0000 mg | ORAL_TABLET | Freq: Every day | ORAL | Status: DC
  Administered 2016-03-07 – 2016-03-10 (×4): 40 mg via ORAL
  Filled 2016-03-07 (×4): qty 1

## 2016-03-07 MED ORDER — WHITE PETROLATUM GEL
Status: AC
  Administered 2016-03-07: 05:00:00
  Filled 2016-03-07: qty 1

## 2016-03-07 MED ORDER — SODIUM CHLORIDE 0.9 % IV SOLN
INTRAVENOUS | Status: DC
  Administered 2016-03-07 – 2016-03-09 (×3): via INTRAVENOUS

## 2016-03-07 MED ORDER — MORPHINE SULFATE (PF) 4 MG/ML IV SOLN
2.0000 mg | INTRAVENOUS | Status: DC | PRN
  Administered 2016-03-09 – 2016-03-10 (×4): 2 mg via INTRAVENOUS
  Filled 2016-03-07 (×4): qty 1

## 2016-03-07 MED ORDER — ACETAMINOPHEN 325 MG PO TABS: 650.0000 mg | ORAL_TABLET | Freq: Four times a day (QID) | ORAL | Status: DC | PRN

## 2016-03-07 MED ORDER — CIPROFLOXACIN IN D5W 400 MG/200ML IV SOLN
400.0000 mg | Freq: Once | INTRAVENOUS | Status: AC
  Administered 2016-03-07: 400 mg via INTRAVENOUS
  Filled 2016-03-07: qty 200

## 2016-03-07 MED ORDER — CIPROFLOXACIN IN D5W 400 MG/200ML IV SOLN
400.0000 mg | Freq: Two times a day (BID) | INTRAVENOUS | Status: AC
  Administered 2016-03-07 – 2016-03-11 (×8): 400 mg via INTRAVENOUS
  Filled 2016-03-07 (×9): qty 200

## 2016-03-07 MED ORDER — ONDANSETRON 4 MG PO TBDP
4.0000 mg | ORAL_TABLET | Freq: Four times a day (QID) | ORAL | Status: DC | PRN
  Administered 2016-03-11: 4 mg via ORAL
  Filled 2016-03-07: qty 1

## 2016-03-07 MED ORDER — ASPIRIN EC 81 MG PO TBEC: 81.0000 mg | DELAYED_RELEASE_TABLET | Freq: Every day | ORAL | Status: DC

## 2016-03-07 MED ORDER — PROMETHAZINE HCL 25 MG/ML IJ SOLN
6.2500 mg | INTRAMUSCULAR | Status: DC | PRN
  Administered 2016-03-07 – 2016-03-09 (×5): 6.25 mg via INTRAVENOUS
  Filled 2016-03-07 (×4): qty 1

## 2016-03-07 MED ORDER — HYDROCODONE-ACETAMINOPHEN 5-325 MG PO TABS
1.0000 | ORAL_TABLET | ORAL | Status: DC | PRN
  Administered 2016-03-07 (×2): 1 via ORAL
  Administered 2016-03-07: 2 via ORAL
  Administered 2016-03-08: 1 via ORAL
  Administered 2016-03-10 (×2): 2 via ORAL
  Filled 2016-03-07: qty 1
  Filled 2016-03-07: qty 2
  Filled 2016-03-07: qty 1
  Filled 2016-03-07 (×2): qty 2
  Filled 2016-03-07: qty 1

## 2016-03-07 MED ORDER — ONDANSETRON HCL 4 MG/2ML IJ SOLN
4.0000 mg | Freq: Four times a day (QID) | INTRAMUSCULAR | Status: DC | PRN
  Administered 2016-03-07 – 2016-03-10 (×6): 4 mg via INTRAVENOUS
  Filled 2016-03-07 (×6): qty 2

## 2016-03-07 MED ORDER — METRONIDAZOLE IN NACL 5-0.79 MG/ML-% IV SOLN
500.0000 mg | Freq: Once | INTRAVENOUS | Status: AC
Start: 2016-03-07 — End: 2016-03-07
  Administered 2016-03-07: 500 mg via INTRAVENOUS
  Filled 2016-03-07: qty 100

## 2016-03-07 MED ORDER — SIMETHICONE 80 MG PO CHEW
40.0000 mg | CHEWABLE_TABLET | Freq: Four times a day (QID) | ORAL | Status: DC | PRN
  Filled 2016-03-07: qty 1

## 2016-03-07 MED ORDER — ALBUTEROL SULFATE (2.5 MG/3ML) 0.083% IN NEBU: 3.0000 mL | INHALATION_SOLUTION | Freq: Four times a day (QID) | RESPIRATORY_TRACT | Status: DC | PRN

## 2016-03-07 NOTE — H&P (Signed)
Reason for Consult:abdominal pain Referring Physician: Zian Mohamed is an 71 y.o. female.  HPI: 71 yo female with 3 days of malaise and abdominal pain. She initially thought she had the flu. She had diffuse abdominal pain lasting over the last two days as well as nausea and vomiting. She has subjective fevers. She has had pains like this before but not this severe. She has diarrhea at baseline and continues to have diarrhea.  Past Medical History:  Diagnosis Date  . COPD (chronic obstructive pulmonary disease) ([place])   . Coronary artery disease 1996   Known with prior mild lesion of LAD demonstrated by Cardiac Catheterization in 1996  . Edema of foot    She has a history of chronic edema of the left dated back to age 89 when she suufered severe frostbite playing  on the snow as a child  . Hypertension   . PAC (premature atrial contraction)   . Pancreatitis    x2  . PONV (postoperative nausea and vomiting)    problems waking up last time 4/16 only time  . 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  Marland Kitchen Shortness of breath dyspnea    since stroke 2 months ago -  . 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 TIA (transient ischemic attack)    She was hospitalized 04-23-09 through 04-27-09 for involving right side of the body  . Tobacco abuse    Ongoing   . Ventricular hypertrophy 04/2009   LVH with diastolic dysfunction by echo. Has normal EF.    Past Surgical History:  Procedure Laterality Date  . ABDOMINAL HYSTERECTOMY    . APPENDECTOMY    . CARDIAC CATHETERIZATION  1996   Mild CAD with vasospasm  . CARDIOVASCULAR STRESS TEST  12-02-2001   EF 70%  . ENDARTERECTOMY Right 08/12/2014   Procedure: RIGHT  COMMON CAROTID ARTERY EXPOSURE FOR INTERVENTIONAL RADIOLOGY PROCEDURE BY  DR.DEVASHWAR,Insertion 6 FR sheath;  Surgeon: Elam Dutch, MD;  Location: [place];  Service: Vascular;  Laterality: Right;  . ENDARTERECTOMY Right 08/12/2014   Procedure:  CAROTID  EXPOSURE CLOSURE RIGHT NECK, REPAIR RIGHT COMMON CAROTID ARTERY;  Surgeon: Elam Dutch, MD;  Location: [place];  Service: Vascular;  Laterality: Right;  . ENDARTERECTOMY Left 11/18/2014   Procedure: CAROTID EXPOSURE;  Surgeon: Elam Dutch, MD;  Location: [place];  Service: Vascular;  Laterality: Left;  . ENDARTERECTOMY Left 11/18/2014   Procedure: CLOSURE CAROTID;  Surgeon: Elam Dutch, MD;  Location: [place];  Service: Vascular;  Laterality: Left;  . LAPAROSCOPY    . NECK SURGERY  50 yrs ago   Left side tumor  . RADIOLOGY WITH ANESTHESIA N/A 05/25/2014   Procedure: RADIOLOGY WITH ANESTHESIA;  Surgeon: [name], MD;  Location: [place];  Service: Radiology;  Laterality: N/A;  . RADIOLOGY WITH ANESTHESIA N/A 08/12/2014   Procedure: RADIOLOGY WITH ANESTHESIA;  Surgeon: [name], MD;  Location: [place];  Service: Radiology;  Laterality: N/A;  . RADIOLOGY WITH ANESTHESIA N/A 03/15/2015   Procedure: MRI OF BRAIN WITHOUT CONTRAST;  Surgeon: Medication Radiologist, MD;  Location: [place];  Service: Radiology;  Laterality: N/A;  DR. TAT/MRI  . US ECHOCARDIOGRAPHY  04-26-2009   EF 65-70%  . VESICOVAGINAL FISTULA CLOSURE  W/ TAH  25 yrs ago    Family History  Problem Relation Age of Onset  . Emphysema Father   . Diabetes Father   . Aneurysm Sister   . Stroke Mother   . Hypertension Mother   . Heart disease Mother   . Hypertension Daughter   . Thyroid disease Daughter     Social History:  reports that she quit smoking about 22 months ago. Her smoking use included Cigarettes. She has a 30.00 pack-year smoking history. She has never used smokeless tobacco. She reports that she does not drink alcohol or use drugs.  Allergies:  Allergies  Allergen Reactions  . Dilaudid [Hydromorphone] Swelling  .  Codeine Nausea And Vomiting  . Hydromorphone Hcl Swelling  . Latex Swelling    gloves used at dental office caused lips to swell. Used different ones no problem. Never had any problem at hospital or anywhere else.  . Sulfa Drugs Cross Reactors Other (See Comments)    Unknown childhood reaction    Medications: I have reviewed the patient's current medications.  Results for orders placed or performed during the hospital encounter of 03/06/16 (from the past 48 hour(s))  Lipase, blood     Status: None   Collection Time: 03/06/16  7:27 PM  Result Value Ref Range   Lipase 23 11 - 51 U/L  Comprehensive metabolic panel     Status: Abnormal   Collection Time: 03/06/16  7:27 PM  Result Value Ref Range   Sodium 139 135 - 145 mmol/L   Potassium 3.8 3.5 - 5.1 mmol/L    Comment: SLIGHT HEMOLYSIS   Chloride 101 101 - 111 mmol/L   CO2 22 22 - 32 mmol/L   Glucose, Bld 107 (H) 65 - 99 mg/dL   BUN 13 6 - 20 mg/dL   Creatinine, Ser 0.79 0.44 - 1.00 mg/dL   Calcium 9.0 8.9 - 10.3 mg/dL   Total Protein 6.4 (L) 6.5 - 8.1 g/dL   Albumin 3.5 3.5 - 5.0 g/dL   AST 28 15 - 41 U/L   ALT 13 (L) 14 - 54 U/L   Alkaline Phosphatase 38 38 - 126 U/L   Total Bilirubin 1.5 (H) 0.3 - 1.2 mg/dL   GFR calc non Af Amer >60 >60 mL/min   GFR calc Af Amer >60 >60 mL/min    Comment: (NOTE) The eGFR has been calculated using the CKD EPI equation. This calculation has not been validated in all clinical situations. eGFR's persistently <60 mL/min signify possible Chronic Kidney Disease.    Anion gap 16 (H) 5 - 15  CBC     Status: Abnormal   Collection Time: 03/06/16  7:27 PM  Result Value Ref Range   WBC 10.3 4.0 - 10.5 K/uL   RBC 4.36 3.87 - 5.11 MIL/uL   Hemoglobin 12.2 12.0 - 15.0 g/dL   HCT 36.5 36.0 - 46.0 %   MCV 83.7 78.0 - 100.0 fL   MCH 28.0 26.0 - 34.0 pg   MCHC 33.4 30.0 - 36.0 g/dL   RDW 15.3 11.5 - 15.5 %   Platelets 120 (L) 150 - 400 K/uL  Urinalysis, Routine w reflex microscopic     Status:  Abnormal   Collection Time: 03/06/16  7:27 PM  Result Value Ref Range   Color, Urine YELLOW YELLOW   APPearance HAZY (A) CLEAR   Specific Gravity, Urine 1.023 1.005 - 1.030   pH 7.0 5.0 - 8.0   Glucose, UA NEGATIVE NEGATIVE mg/dL  Hgb urine dipstick SMALL (A) NEGATIVE   Bilirubin Urine NEGATIVE NEGATIVE   Ketones, ur NEGATIVE NEGATIVE mg/dL   Protein, ur 30 (A) NEGATIVE mg/dL   Nitrite NEGATIVE NEGATIVE   Leukocytes, UA NEGATIVE NEGATIVE   RBC / HPF 6-30 0 - 5 RBC/hpf   WBC, UA 0-5 0 - 5 WBC/hpf   Bacteria, UA NONE SEEN NONE SEEN   Squamous Epithelial / LPF 6-30 (A) NONE SEEN   Mucous PRESENT     US Abdomen Complete  Result Date: 03/06/2016 CLINICAL DATA:  Initial evaluation for elevated bilirubin with nausea, vomiting, and right upper quadrant pain. EXAM: ABDOMEN ULTRASOUND COMPLETE COMPARISON:  None. FINDINGS: Gallbladder: No gallstones or wall thickening visualized. No sonographic Murphy sign noted by sonographer. Common bile duct: Diameter: 5 mm Liver: No focal lesion identified. Increased echogenicity, suggestive of steatosis. IVC: No abnormality visualized. Pancreas: Visualized portion unremarkable. Spleen: Size and appearance within normal limits. Right Kidney: Length: 10.7 cm. Echogenicity within normal limits. No mass or hydronephrosis visualized. Left Kidney: Length: 12.0 cm. Echogenicity within normal limits. No mass or hydronephrosis visualized. Cortical thinning at the upper pole noted. Abdominal aorta: No aneurysm visualized. Other findings: None. IMPRESSION: 1. Normal sonographic appearance of the gallbladder. No evidence for cholelithiasis, acute cholecystitis, or biliary dilatation. 2. Hepatic steatosis. 3. No other acute abnormality within the abdomen. Electronically Signed   By: Jeannine Boga M.D.   On: 03/06/2016 21:41   Ct Abdomen Pelvis W Contrast  Result Date: 03/07/2016 CLINICAL DATA:  Acute onset of generalized abdominal pain, nausea and vomiting. Initial  encounter. EXAM: CT ABDOMEN AND PELVIS WITH CONTRAST TECHNIQUE: Multidetector CT imaging of the abdomen and pelvis was performed using the standard protocol following bolus administration of intravenous contrast. CONTRAST:  100 mL Prelim ISOVUE-300 IOPAMIDOL (ISOVUE-300) INJECTION 61% COMPARISON:  Abdominal ultrasound performed earlier today at 9:19 p.m. FINDINGS: Lower chest: Mild calcification is noted at the mitral valve. The heart is mildly enlarged. The visualized lung bases are grossly clear. Hepatobiliary: Trace ascites is seen tracking about the liver. The liver is unremarkable in appearance. Note is made of diffuse soft tissue inflammation about the gallbladder, with associated wall thickening, concerning for acute cholecystitis. Mild soft tissue inflammation extends about the adjacent hepatic flexure of the colon. The common bile duct remains normal in caliber. No definite stones are characterized on CT. Pancreas: The pancreas is within normal limits. Spleen: The spleen is mildly enlarged, measuring 13.9 cm in length. Adrenals/Urinary Tract: The adrenal glands are unremarkable in appearance. The kidneys are within normal limits. There is no evidence of hydronephrosis. No renal or ureteral stones are identified. No perinephric stranding is seen. Stomach/Bowel: The stomach is unremarkable in appearance. The small bowel is within normal limits. The patient is status post appendectomy. Minimal diverticulosis is noted along the proximal sigmoid colon, without evidence of diverticulitis. Trace fluid within the pelvis may be physiologic in nature, or may reflect the acute cholecystitis. Vascular/Lymphatic: Scattered calcification is seen along the abdominal aorta and its branches. The abdominal aorta is otherwise grossly unremarkable. The inferior vena cava is grossly unremarkable. No retroperitoneal lymphadenopathy is seen. No pelvic sidewall lymphadenopathy is identified. Reproductive: The bladder is mildly  distended and grossly unremarkable. The patient is status post hysterectomy. No suspicious adnexal masses are seen. The left ovary is unremarkable in appearance. Other: No additional soft tissue abnormalities are seen. Musculoskeletal: No acute osseous abnormalities are identified. The visualized musculature is unremarkable in appearance. IMPRESSION: 1. Acute cholecystitis, with diffuse soft tissue inflammation  about the gallbladder and associated wall thickening. Trace associated ascites seen tracking about the liver. Mild soft tissue inflammation extends about the adjacent hepatic flexure of the colon. No definite stones characterized on CT, though evaluation for stones is limited on CT. 2. Mild splenomegaly. 3. Minimal diverticulosis along the proximal sigmoid colon, without evidence of diverticulitis. 4. Mild cardiomegaly.  Mild calcification at the mitral valve. 5. Scattered aortic atherosclerosis. Electronically Signed   By: Garald Balding M.D.   On: 03/07/2016 00:29    Review of Systems  Constitutional: Positive for fever and malaise/fatigue. Negative for chills.  HENT: Negative for hearing loss.   Eyes: Negative for blurred vision and double vision.  Respiratory: Negative for cough and hemoptysis.   Cardiovascular: Negative for chest pain and palpitations.  Gastrointestinal: Positive for abdominal pain, diarrhea, nausea and vomiting.  Genitourinary: Negative for dysuria and urgency.  Musculoskeletal: Negative for myalgias and neck pain.  Skin: Negative for itching and rash.  Neurological: Negative for dizziness, tingling and headaches.  Endo/Heme/Allergies: Does not bruise/bleed easily.  Psychiatric/Behavioral: Negative for depression and suicidal ideas.   Blood pressure 168/84, pulse 82, temperature 100.6 F (38.1 C), temperature source Rectal, resp. rate 22, height '5\' 1"'  (1.549 m), weight 70.8 kg (156 lb), SpO2 100 %. Physical Exam  Vitals reviewed. Constitutional: She is oriented to  person, place, and time. She appears well-developed and well-nourished.  HENT:  Head: Normocephalic and atraumatic.  Eyes: Conjunctivae and EOM are normal. Pupils are equal, round, and reactive to light.  Neck: Normal range of motion. Neck supple.  Cardiovascular: Normal rate and regular rhythm.   Respiratory: Effort normal and breath sounds normal.  GI: Soft. Bowel sounds are normal. She exhibits no distension. There is tenderness in the right lower quadrant.  Musculoskeletal: Normal range of motion.  Neurological: She is alert and oriented to person, place, and time.  Skin: Skin is warm and dry.  Psychiatric: She has a normal mood and affect. Her behavior is normal.      Assessment/Plan: 71 yo female with abdominal pain in her mid and lower right abdomen and CT scan concerning for cholecystitis. Total bili of 1.5 -repeat LFTs in am -admit to hospital -IV abx -likely will need cholecystectomy during this hospitalization  Arta Bruce Kinsinger 03/07/2016, 3:05 AM

## 2016-03-07 NOTE — Progress Notes (Signed)
Patient ID: Jeanne Lawson, female   DOB: May 20, 1945, 71 y.o.   MRN: [id number]  [facility] Surgery Progress Note     Subjective: Patient continues to have right sided abdominal pain. Denies any current n/v. She is asking for a diet.  Of note, Jeanne Lawson takes 37.5mg  Plavix every other day for h/o multiple strokes and TIA's. Last dose was on 03/05/16. She has had multiple stents placed, most recently carotid stent in 10/2014. She is followed by Dr. [name].  Objective: Vital signs in last 24 hours: Temp:  [100 F (37.8 C)-100.6 F (38.1 C)] 100.6 F (38.1 C) (02/12 2249) Pulse Rate:  [66-82] 76 (02/13 0304) Resp:  [18-26] 22 (02/12 2300) BP: (112-168)/(51-84) 118/64 (02/13 0304) SpO2:  [96 %-100 %] 96 % (02/13 0304) Weight:  [156 lb (70.8 kg)] 156 lb (70.8 kg) (02/12 1646) Last BM Date: 03/06/16  Intake/Output from previous day: 02/12 0701 - 02/13 0700 In: 1167.5 [I.V.:67.5; IV Piggyback:1100] Out: -  Intake/Output this shift: No intake/output data recorded.  PE: Gen:  Alert, NAD, pleasant Card:  RRR, no M/G/R heard Pulm:  CTAB, no W/R/R, effort normal Abd: well healed vertical incision distal to umbilicus, soft, ND, +BS, no HSM, TTP RUQ and RLQ, negative murphys Ext:  No erythema, edema, or tenderness   Lab Results:   Recent Labs  03/06/16 1927 03/07/16 0343  WBC 10.3 6.5  HGB 12.2 10.1*  HCT 36.5 30.9*  PLT 120* 103*   BMET  Recent Labs  03/06/16 1927 03/07/16 0343  NA 139 137  K 3.8 3.0*  CL 101 103  CO2 22 25  GLUCOSE 107* 119*  BUN 13 13  CREATININE 0.79 0.78  CALCIUM 9.0 7.9*   PT/INR No results for input(s): LABPROT, INR in the last 72 hours. CMP     Component Value Date/Time   NA 137 03/07/2016 0343   K 3.0 (L) 03/07/2016 0343   CL 103 03/07/2016 0343   CO2 25 03/07/2016 0343   GLUCOSE 119 (H) 03/07/2016 0343   BUN 13 03/07/2016 0343   CREATININE 0.78 03/07/2016 0343   CALCIUM 7.9 (L) 03/07/2016 0343   PROT 5.7 (L) 03/07/2016 0343    ALBUMIN 2.9 (L) 03/07/2016 0343   AST 17 03/07/2016 0343   ALT 11 (L) 03/07/2016 0343   ALKPHOS 31 (L) 03/07/2016 0343   BILITOT 1.5 (H) 03/07/2016 0343   GFRNONAA >60 03/07/2016 0343   GFRAA >60 03/07/2016 0343   Lipase     Component Value Date/Time   LIPASE 23 03/06/2016 1927       Studies/Results: US Abdomen Complete  Result Date: 03/06/2016 CLINICAL DATA:  Initial evaluation for elevated bilirubin with nausea, vomiting, and right upper quadrant pain. EXAM: ABDOMEN ULTRASOUND COMPLETE COMPARISON:  None. FINDINGS: Gallbladder: No gallstones or wall thickening visualized. No sonographic Murphy sign noted by sonographer. Common bile duct: Diameter: 5 mm Liver: No focal lesion identified. Increased echogenicity, suggestive of steatosis. IVC: No abnormality visualized. Pancreas: Visualized portion unremarkable. Spleen: Size and appearance within normal limits. Right Kidney: Length: 10.7 cm. Echogenicity within normal limits. No mass or hydronephrosis visualized. Left Kidney: Length: 12.0 cm. Echogenicity within normal limits. No mass or hydronephrosis visualized. Cortical thinning at the upper pole noted. Abdominal aorta: No aneurysm visualized. Other findings: None. IMPRESSION: 1. Normal sonographic appearance of the gallbladder. No evidence for cholelithiasis, acute cholecystitis, or biliary dilatation. 2. Hepatic steatosis. 3. No other acute abnormality within the abdomen. Electronically Signed   By: Pincus Badder.D.  On: 03/06/2016 21:41   Ct Abdomen Pelvis W Contrast  Result Date: 03/07/2016 CLINICAL DATA:  Acute onset of generalized abdominal pain, nausea and vomiting. Initial encounter. EXAM: CT ABDOMEN AND PELVIS WITH CONTRAST TECHNIQUE: Multidetector CT imaging of the abdomen and pelvis was performed using the standard protocol following bolus administration of intravenous contrast. CONTRAST:  100 mL Prelim ISOVUE-300 IOPAMIDOL (ISOVUE-300) INJECTION 61% COMPARISON:   Abdominal ultrasound performed earlier today at 9:19 p.m. FINDINGS: Lower chest: Mild calcification is noted at the mitral valve. The heart is mildly enlarged. The visualized lung bases are grossly clear. Hepatobiliary: Trace ascites is seen tracking about the liver. The liver is unremarkable in appearance. Note is made of diffuse soft tissue inflammation about the gallbladder, with associated wall thickening, concerning for acute cholecystitis. Mild soft tissue inflammation extends about the adjacent hepatic flexure of the colon. The common bile duct remains normal in caliber. No definite stones are characterized on CT. Pancreas: The pancreas is within normal limits. Spleen: The spleen is mildly enlarged, measuring 13.9 cm in length. Adrenals/Urinary Tract: The adrenal glands are unremarkable in appearance. The kidneys are within normal limits. There is no evidence of hydronephrosis. No renal or ureteral stones are identified. No perinephric stranding is seen. Stomach/Bowel: The stomach is unremarkable in appearance. The small bowel is within normal limits. The patient is status post appendectomy. Minimal diverticulosis is noted along the proximal sigmoid colon, without evidence of diverticulitis. Trace fluid within the pelvis may be physiologic in nature, or may reflect the acute cholecystitis. Vascular/Lymphatic: Scattered calcification is seen along the abdominal aorta and its branches. The abdominal aorta is otherwise grossly unremarkable. The inferior vena cava is grossly unremarkable. No retroperitoneal lymphadenopathy is seen. No pelvic sidewall lymphadenopathy is identified. Reproductive: The bladder is mildly distended and grossly unremarkable. The patient is status post hysterectomy. No suspicious adnexal masses are seen. The left ovary is unremarkable in appearance. Other: No additional soft tissue abnormalities are seen. Musculoskeletal: No acute osseous abnormalities are identified. The visualized  musculature is unremarkable in appearance. IMPRESSION: 1. Acute cholecystitis, with diffuse soft tissue inflammation about the gallbladder and associated wall thickening. Trace associated ascites seen tracking about the liver. Mild soft tissue inflammation extends about the adjacent hepatic flexure of the colon. No definite stones characterized on CT, though evaluation for stones is limited on CT. 2. Mild splenomegaly. 3. Minimal diverticulosis along the proximal sigmoid colon, without evidence of diverticulitis. 4. Mild cardiomegaly.  Mild calcification at the mitral valve. 5. Scattered aortic atherosclerosis. Electronically Signed   By: Garald Balding M.D.   On: 03/07/2016 00:29    Anti-infectives: Anti-infectives    Start     Dose/Rate Route Frequency Ordered Stop   03/07/16 0100  ciprofloxacin (CIPRO) IVPB 400 mg     400 mg 200 mL/hr over 60 Minutes Intravenous  Once 03/07/16 0047 03/07/16 0406   03/07/16 0100  metroNIDAZOLE (FLAGYL) IVPB 500 mg     500 mg 100 mL/hr over 60 Minutes Intravenous  Once 03/07/16 0047 03/07/16 0229   03/07/16 0000  metroNIDAZOLE (FLAGYL) tablet 500 mg  Status:  Discontinued     500 mg Oral  Once 03/06/16 2358 03/07/16 0046   03/07/16 0000  ciprofloxacin (CIPRO) tablet 500 mg  Status:  Discontinued     500 mg Oral  Once 03/06/16 2358 03/07/16 0046       Assessment/Plan Abdominal pain Possible cholecystitis - abdominal surgical history includes hysterectomy, appendectomy and right oophrectomy >> all this was done during the  same procedure - CT reports acute cholecystitis with diffuse soft tissue inflammation about the gallbladder and associated wall thickening, no definite stones - u/s shows no evidence for cholelithiasis, acute cholecystitis, or biliary dilatation - bilirubin same this morning 1.5 - WBC 6.5, afebrile   H/o multiple strokes and TIA's - on plavix, last dose 37.5mg  on 2/11; followed by Dr. [name] HTN  COPD - daily albuterol inhaler Previous  h/o pancreatitis  ID - cipro 2/13>>, flagyl x1 dose 2/13 FEN - IVF, clear liquids VTE - SCDs  Plan - Hold plavix and tentatively plan for OR on Thursday; will consult neurology for preoperative recommendations. Ok for clear liquid diet. Repeat CBC/CMP in AM.    LOS: 0 days    Jerrye Beavers , [facility] Surgery 03/07/2016, 7:46 AM Pager: [phone] Consults: [phone] Mon-Fri 7:00 am-4:30 pm Sat-Sun 7:00 am-11:30 am

## 2016-03-07 NOTE — Consult Note (Signed)
Stroke Neurology Consultation Note  Consult Requested by: Dr. Kieth Brightly  Reason for Consult: periop recommendation   Consult Date: 03/07/16  The history was obtained from the pt and chart.  During history and examination, all items was able to obtain unless otherwise noted.  History of Present Illness:  Jeanne Lawson is a 71 y.o. Caucasian female with PMH of stroke, cerebral aneurysms s/p pipeline stents, CAD, HTN, HLD, former smoker admitted for abdominal pain, worsening diarrhea, malaise and N/V. CT abdomen concerning for cholecystitis. Plan for cholecystectomy Thursday. Neurology was consulted preop recommendations. Her plavix on hold now.  Pt had right CR and PLIC infarct in [id number] and had residue left sided hemiparesis. Had again left BG/CR infarct in 04/2014. Stroke work up found to have left ICA and right PCOM large aneurysms. She was discharged with ASA, plavix and lipitor. Followed with Dr. [name] and received pipeline stent embolization to aneurysms in 07/2014 and 11/2014. She had extensive bruise after procedure and her P2Y12 after procedure was low and her plavix was decreased to 37.5mg  daily along with ASA 81mg . Repeat CTA head and neck showed continued presence of aneurysm even with treatment, but stable. She follows with Dr. [name] regularly for aneurysm monitoring. She had again scattered left PCA subcortical infarcts in 02/2015, stroke work up negative. Continued on ASA and plavix and lipitor. She follows with Dr. [name] at [facility] for stroke and no stroke since then.   She has hx of HTN, HLD, CAD and follows with Cardiology Dr. Meda Coffee and no change of medications.   She quit smoking about two years ago.   Past Medical History:  Diagnosis Date  . COPD (chronic obstructive pulmonary disease) ([place])   . Coronary artery disease 1996   Known with prior mild lesion of LAD demonstrated by Cardiac Catheterization in 1996  . Edema of foot    She has a history of chronic edema of the  left dated back to age 70 when she suufered severe frostbite playing  on the snow as a child  . Hypertension   . PAC (premature atrial contraction)   . Pancreatitis    x2  . PONV (postoperative nausea and vomiting)    problems waking up last time 4/16 only time  . 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  Marland Kitchen Shortness of breath dyspnea    since stroke 2 months ago -  . Stroke [facility]) 04/2014   She had had a previous thrombotic stroke  involving the right corona radiata in October 2010; left arm and leg weakness  . TIA (transient ischemic attack)    She was hospitalized 04-23-09 through 04-27-09 for involving right side of the body  . Tobacco abuse    Ongoing   . Ventricular hypertrophy 04/2009   LVH with diastolic dysfunction by echo. Has normal EF.    Past Surgical History:  Procedure Laterality Date  . ABDOMINAL HYSTERECTOMY    . APPENDECTOMY    . CARDIAC CATHETERIZATION  1996   Mild CAD with vasospasm  . CARDIOVASCULAR STRESS TEST  12-02-2001   EF 70%  . ENDARTERECTOMY Right 08/12/2014   Procedure: RIGHT  COMMON CAROTID ARTERY EXPOSURE FOR INTERVENTIONAL RADIOLOGY PROCEDURE BY DR.DEVASHWAR,Insertion 6 FR sheath;  Surgeon: Elam Dutch, MD;  Location: [place];  Service: Vascular;  Laterality: Right;  . ENDARTERECTOMY Right 08/12/2014   Procedure:  CAROTID  EXPOSURE CLOSURE RIGHT NECK,  REPAIR RIGHT COMMON CAROTID ARTERY;  Surgeon: Elam Dutch, MD;  Location: [place];  Service: Vascular;  Laterality: Right;  . ENDARTERECTOMY Left 11/18/2014   Procedure: CAROTID EXPOSURE;  Surgeon: Elam Dutch, MD;  Location: [place];  Service: Vascular;  Laterality: Left;  . ENDARTERECTOMY Left 11/18/2014   Procedure: CLOSURE CAROTID;  Surgeon: Elam Dutch, MD;  Location: [place];  Service: Vascular;  Laterality: Left;  . LAPAROSCOPY    . NECK SURGERY  50 yrs ago   Left side tumor  . RADIOLOGY WITH ANESTHESIA N/A  05/25/2014   Procedure: RADIOLOGY WITH ANESTHESIA;  Surgeon: [name], MD;  Location: [place];  Service: Radiology;  Laterality: N/A;  . RADIOLOGY WITH ANESTHESIA N/A 08/12/2014   Procedure: RADIOLOGY WITH ANESTHESIA;  Surgeon: [name], MD;  Location: [place];  Service: Radiology;  Laterality: N/A;  . RADIOLOGY WITH ANESTHESIA N/A 03/15/2015   Procedure: MRI OF BRAIN WITHOUT CONTRAST;  Surgeon: Medication Radiologist, MD;  Location: [place];  Service: Radiology;  Laterality: N/A;  DR. TAT/MRI  . US ECHOCARDIOGRAPHY  04-26-2009   EF 65-70%  . VESICOVAGINAL FISTULA CLOSURE W/ TAH  25 yrs ago    Family History  Problem Relation Age of Onset  . Emphysema Father   . Diabetes Father   . Aneurysm Sister   . Stroke Mother   . Hypertension Mother   . Heart disease Mother   . Hypertension Daughter   . Thyroid disease Daughter     Social History:  reports that she quit smoking about 22 months ago. Her smoking use included Cigarettes. She has a 30.00 pack-year smoking history. She has never used smokeless tobacco. She reports that she does not drink alcohol or use drugs.  Allergies:  Allergies  Allergen Reactions  . Dilaudid [Hydromorphone] Swelling  . Codeine Nausea And Vomiting  . Latex Swelling    gloves used at dental office caused lips to swell. Used different ones no problem. Never had any problem at hospital or anywhere else.  . Sulfa Drugs Cross Reactors Other (See Comments)    Unknown childhood reaction    No current facility-administered medications on file prior to encounter.    Current Outpatient Prescriptions on File Prior to Encounter  Medication Sig Dispense Refill  . albuterol (PROVENTIL HFA;VENTOLIN HFA) 108 (90 BASE) MCG/ACT inhaler Inhale 1-2 puffs into the lungs every 6 (six) hours as needed for wheezing or shortness of breath.    . clopidogrel (PLAVIX) 75 MG tablet Take 0.5 tablets (37.5 mg total) by mouth daily. (Patient taking differently: Take 37.5 mg by  mouth every other day. )    . lisinopril-hydrochlorothiazide (PRINZIDE,ZESTORETIC) 20-12.5 MG tablet Take 1 tablet by mouth daily. (Patient taking differently: Take 1 tablet by mouth daily after lunch. ) 30 tablet 11  . TOPROL XL 50 MG 24 hr tablet Take 25 mg by mouth twice a day with or immediately following a meal. (Patient taking differently: Take 25 mg by mouth 2 (two) times daily with a meal. ) 60 tablet 11    Review of Systems: A full ROS was attempted today and was able to be performed.  Systems assessed include - Constitutional, Eyes, HENT, Respiratory, Cardiovascular, Gastrointestinal, Genitourinary, Integument/breast, Hematologic/lymphatic, Musculoskeletal, Neurological, Behavioral/Psych, Endocrine, Allergic/Immunologic - with pertinent responses as per HPI.  Physical Examination: Temp:  [100 F (37.8 C)-100.6 F (38.1 C)] 100.6 F (38.1 C) (02/12 2249) Pulse Rate:  [66-82] 76 (02/13 0304) Resp:  [18-26] 22 (02/12 2300) BP: (112-168)/(51-84) 118/64 (  02/13 0304) SpO2:  [96 %-100 %] 96 % (02/13 0304) Weight:  [156 lb (70.8 kg)] 156 lb (70.8 kg) (02/12 1646)  General - The patient's general appearance was well nourished, well developed, in no apparent distress.    Ophthalmologic - Sharp disc margins OU.    Cardiovascular - Ausculation of the heart revealed regular rate and rhythm.  Mental Status -  Level of arousal and orientation to time, place, and person were intact. Language including expression, naming, repetition, comprehension, reading, and writing was assessed and found intact. Attention span and concentration were normal. Fund of Knowledge was assessed and was intact.  Cranial Nerves II - XII - II - Vision intact OU. III, IV, VI - Extraocular movements intact. V - Facial sensation intact bilaterally. VII - Facial movement intact bilaterally. VIII - Hearing & vestibular intact bilaterally. X - Palate elevates symmetrically. XI - Chin turning & shoulder shrug intact  bilaterally. XII - Tongue protrusion intact.  Motor Strength - The patient's strength was normal in all extremities except left UE and LE 5-/5 with pronator drift present.   Motor Tone & Bulk - Muscle tone was assessed at the neck and appendages and was normal.  Bulk was normal and fasciculations were absent.   Reflexes - The patient's reflexes were normal in all extremities and she had no pathological reflexes.  Sensory - Light touch, temperature/pinprick were assessed and were decreased on the left about 90% as right.    Coordination - The patient had normal movements in the hands with no ataxia or dysmetria.  Tremor was absent.  Gait and Station - deferred.  Data Reviewed: US Abdomen Complete 03/06/2016 IMPRESSION: 1. Normal sonographic appearance of the gallbladder. No evidence for cholelithiasis, acute cholecystitis, or biliary dilatation. 2. Hepatic steatosis. 3. No other acute abnormality within the abdomen.   Ct Abdomen Pelvis W Contrast 03/07/2016 IMPRESSION: 1. Acute cholecystitis, with diffuse soft tissue inflammation about the gallbladder and associated wall thickening. Trace associated ascites seen tracking about the liver. Mild soft tissue inflammation extends about the adjacent hepatic flexure of the colon. No definite stones characterized on CT, though evaluation for stones is limited on CT. 2. Mild splenomegaly. 3. Minimal diverticulosis along the proximal sigmoid colon, without evidence of diverticulitis. 4. Mild cardiomegaly.  Mild calcification at the mitral valve. 5. Scattered aortic atherosclerosis.    Assessment: 71 y.o. female with PMH of multiple strokes, cerebral aneurysms s/p pipeline stents with residual aneurysms, CAD, HTN, HLD and former smoker admitted for abdominal pain, worsening diarrhea, malaise and N/V. CT abdomen concerning for cholecystitis. Plan for cholecystectomy Thursday. Her plavix on hold now. Neurology was consulted preop recommendations. Her last  aneurysm treatment with pipeline stent was in 11/2014. Therefore, it should be fine to hold plavix for surgical intervention. Last plavix dose 03/05/16 and plan for Thursday 03/09/16 is reasonable. We would like to continue ASA 81mg  for stroke prevention if not contraindicated from surgery standpoint. No heparin needed at this time. Will continue lipitor also for stroke prevention. Will check carotid doppler before surgery. Her last CUS was one year ago which was normal, would like repeat to make sure there is no carotid stenosis to complicate the surgery and anesthesia.   Stroke Risk Factors - hyperlipidemia, hypertension  Plan: - OK to have surgery procedure from stroke standpoint - agree to hold plavix for surgery preparation as last stent for aneurysm treatment was in 11/2014 - recommend to continue ASA 81mg  for stroke prevention if not contraindicated for surgery -  recommend to continue lipitor for stroke prevention - will repeat carotid doppler  - will follow   Thank you for this consultation and allowing [place] to participate in the care of this patient.  Rosalin Hawking, MD PhD Stroke Neurology 03/07/2016 9:54 AM

## 2016-03-08 ENCOUNTER — Inpatient Hospital Stay (HOSPITAL_COMMUNITY): Payer: Medicare Other

## 2016-03-08 DIAGNOSIS — K810 Acute cholecystitis: Secondary | ICD-10-CM

## 2016-03-08 DIAGNOSIS — I671 Cerebral aneurysm, nonruptured: Secondary | ICD-10-CM

## 2016-03-08 DIAGNOSIS — Z8673 Personal history of transient ischemic attack (TIA), and cerebral infarction without residual deficits: Secondary | ICD-10-CM

## 2016-03-08 LAB — COMPREHENSIVE METABOLIC PANEL
ALBUMIN: 2.8 g/dL — AB (ref 3.5–5.0)
ALT: 10 U/L — AB (ref 14–54)
AST: 16 U/L (ref 15–41)
Alkaline Phosphatase: 33 U/L — ABNORMAL LOW (ref 38–126)
Anion gap: 8 (ref 5–15)
BUN: 11 mg/dL (ref 6–20)
CHLORIDE: 101 mmol/L (ref 101–111)
CO2: 27 mmol/L (ref 22–32)
Calcium: 7.7 mg/dL — ABNORMAL LOW (ref 8.9–10.3)
Creatinine, Ser: 0.8 mg/dL (ref 0.44–1.00)
GFR calc non Af Amer: >60 mL/min (ref >60)
GLUCOSE: 84 mg/dL (ref 65–99)
Potassium: 3.2 mmol/L — ABNORMAL LOW (ref 3.5–5.1)
SODIUM: 136 mmol/L (ref 135–145)
Total Bilirubin: 1.4 mg/dL — ABNORMAL HIGH (ref 0.3–1.2)
Total Protein: 5.6 g/dL — ABNORMAL LOW (ref 6.5–8.1)

## 2016-03-08 LAB — SURGICAL PCR SCREEN
MRSA, PCR: NEGATIVE
Staphylococcus aureus: NEGATIVE

## 2016-03-08 LAB — LIPID PANEL
CHOLESTEROL: 119 mg/dL (ref 0–200)
HDL: 58 mg/dL (ref >40)
LDL Cholesterol: 47 mg/dL (ref 0–99)
Total CHOL/HDL Ratio: 2.1 RATIO
Triglycerides: 68 mg/dL (ref <150)
VLDL: 14 mg/dL (ref 0–40)

## 2016-03-08 LAB — CBC
HCT: 30.5 % — ABNORMAL LOW (ref 36.0–46.0)
HEMOGLOBIN: 9.6 g/dL — AB (ref 12.0–15.0)
MCH: 27 pg (ref 26.0–34.0)
MCHC: 31.5 g/dL (ref 30.0–36.0)
MCV: 85.9 fL (ref 78.0–100.0)
PLATELETS: 101 10*3/uL — AB (ref 150–400)
RBC: 3.55 MIL/uL — ABNORMAL LOW (ref 3.87–5.11)
RDW: 15.8 % — ABNORMAL HIGH (ref 11.5–15.5)
WBC: 6 10*3/uL (ref 4.0–10.5)

## 2016-03-08 LAB — TSH: TSH: 2.332 u[IU]/mL (ref 0.350–4.500)

## 2016-03-08 LAB — VITAMIN B12: Vitamin B-12: 131 pg/mL — ABNORMAL LOW (ref 180–914)

## 2016-03-08 MED ORDER — CYANOCOBALAMIN 1000 MCG/ML IJ SOLN
1000.0000 ug | Freq: Every day | INTRAMUSCULAR | Status: DC
  Administered 2016-03-08 – 2016-03-10 (×3): 1000 ug via INTRAMUSCULAR
  Filled 2016-03-08 (×5): qty 1

## 2016-03-08 MED ORDER — POTASSIUM CHLORIDE CRYS ER 20 MEQ PO TBCR
20.0000 meq | EXTENDED_RELEASE_TABLET | Freq: Two times a day (BID) | ORAL | Status: DC
  Administered 2016-03-08 – 2016-03-10 (×5): 20 meq via ORAL
  Filled 2016-03-08 (×5): qty 1

## 2016-03-08 MED ORDER — KETOROLAC TROMETHAMINE 15 MG/ML IJ SOLN: 15.0000 mg | Freq: Three times a day (TID) | INTRAMUSCULAR | Status: DC | PRN

## 2016-03-08 MED ORDER — HYDRALAZINE HCL 20 MG/ML IJ SOLN: 10.0000 mg | INTRAMUSCULAR | Status: DC | PRN

## 2016-03-08 MED ORDER — KETOROLAC TROMETHAMINE 15 MG/ML IJ SOLN
15.0000 mg | Freq: Three times a day (TID) | INTRAMUSCULAR | Status: AC | PRN
  Administered 2016-03-08: 15 mg via INTRAVENOUS
  Filled 2016-03-08: qty 1

## 2016-03-08 NOTE — Progress Notes (Addendum)
STROKE TEAM PROGRESS NOTE   SUBJECTIVE (INTERVAL HISTORY) No family is at the bedside.  She still has nausea, in mild abdominal pain. Just received Phenergan for nausea. Neuro stable. Plan for surgery tomorrow. Found to have low B12 levels.    OBJECTIVE Temp:  [98.5 F (36.9 C)-99.5 F (37.5 C)] 98.7 F (37.1 C) (02/14 1405) Pulse Rate:  [60-80] 80 (02/14 1405) Resp:  [18-19] 18 (02/14 1405) BP: (104-132)/(60-72) 118/67 (02/14 1405) SpO2:  [96 %-97 %] 97 % (02/14 1405)  No results for input(s): GLUCAP in the last 168 hours.  Recent Labs Lab 03/06/16 1927 03/07/16 0343 03/08/16 0447  NA 139 137 136  K 3.8 3.0* 3.2*  CL 101 103 101  CO2 22 25 27   GLUCOSE 107* 119* 84  BUN 13 13 11   CREATININE 0.79 0.78 0.80  CALCIUM 9.0 7.9* 7.7*    Recent Labs Lab 03/06/16 1927 03/07/16 0343 03/08/16 0447  AST 28 17 16   ALT 13* 11* 10*  ALKPHOS 38 31* 33*  BILITOT 1.5* 1.5* 1.4*  PROT 6.4* 5.7* 5.6*  ALBUMIN 3.5 2.9* 2.8*    Recent Labs Lab 03/06/16 1927 03/07/16 0343 03/08/16 0447  WBC 10.3 6.5 6.0  HGB 12.2 10.1* 9.6*  HCT 36.5 30.9* 30.5*  MCV 83.7 85.6 85.9  PLT 120* 103* 101*   No results for input(s): CKTOTAL, CKMB, CKMBINDEX, TROPONINI in the last 168 hours. No results for input(s): LABPROT, INR in the last 72 hours.  Recent Labs  03/06/16 1927  COLORURINE YELLOW  LABSPEC 1.023  PHURINE 7.0  GLUCOSEU NEGATIVE  HGBUR SMALL*  BILIRUBINUR NEGATIVE  KETONESUR NEGATIVE  PROTEINUR 30*  NITRITE NEGATIVE  LEUKOCYTESUR NEGATIVE       Component Value Date/Time   CHOL 119 03/08/2016 0447   TRIG 68 03/08/2016 0447   HDL 58 03/08/2016 0447   CHOLHDL 2.1 03/08/2016 0447   VLDL 14 03/08/2016 0447   LDLCALC 47 03/08/2016 0447   Lab Results  Component Value Date   HGBA1C 5.5 03/13/2015      Component Value Date/Time   LABOPIA NONE DETECTED 03/12/2015 1415   COCAINSCRNUR NONE DETECTED 03/12/2015 1415   LABBENZ NONE DETECTED 03/12/2015 1415   AMPHETMU  NONE DETECTED 03/12/2015 1415   THCU NONE DETECTED 03/12/2015 1415   LABBARB NONE DETECTED 03/12/2015 1415    No results for input(s): ETH in the last 168 hours.  Physical Examination: Temp:  [100 F (37.8 C)-100.6 F (38.1 C)] 100.6 F (38.1 C) (02/12 2249) Pulse Rate:  [66-82] 76 (02/13 0304) Resp:  [18-26] 22 (02/12 2300) BP: (112-168)/(51-84) 118/64 (02/13 0304) SpO2:  [96 %-100 %] 96 % (02/13 0304) Weight:  [156 lb (70.8 kg)] 156 lb (70.8 kg) (02/12 1646)  General - The patient's general appearance was well nourished, well developed, in no apparent distress.    Ophthalmologic - Sharp disc margins OU.    Cardiovascular - Ausculation of the heart revealed regular rate and rhythm.  Mental Status -  Level of arousal and orientation to time, place, and person were intact. Language including expression, naming, repetition, comprehension, reading, and writing was assessed and found intact. Attention span and concentration were normal. Fund of Knowledge was assessed and was intact.  Cranial Nerves II - XII - II - Vision intact OU. III, IV, VI - Extraocular movements intact. V - Facial sensation intact bilaterally. VII - Facial movement intact bilaterally. VIII - Hearing & vestibular intact bilaterally. X - Palate elevates symmetrically. XI - Chin turning &  shoulder shrug intact bilaterally. XII - Tongue protrusion intact.  Motor Strength - The patient's strength was normal in all extremities except left UE and LE 5-/5 with pronator drift present.   Motor Tone & Bulk - Muscle tone was assessed at the neck and appendages and was normal.  Bulk was normal and fasciculations were absent.   Reflexes - The patient's reflexes were normal in all extremities and she had no pathological reflexes.  Sensory - Light touch, temperature/pinprick were assessed and were decreased on the left about 90% as right.    Coordination - The patient had normal movements in the hands with  no ataxia or dysmetria.  Tremor was absent.  Gait and Station - deferred.  Data Reviewed: US Abdomen Complete 03/06/2016 IMPRESSION: 1. Normal sonographic appearance of the gallbladder. No evidence for cholelithiasis, acute cholecystitis, or biliary dilatation. 2. Hepatic steatosis. 3. No other acute abnormality within the abdomen.   Ct Abdomen Pelvis W Contrast 03/07/2016 IMPRESSION: 1. Acute cholecystitis, with diffuse soft tissue inflammation about the gallbladder and associated wall thickening. Trace associated ascites seen tracking about the liver. Mild soft tissue inflammation extends about the adjacent hepatic flexure of the colon. No definite stones characterized on CT, though evaluation for stones is limited on CT. 2. Mild splenomegaly. 3. Minimal diverticulosis along the proximal sigmoid colon, without evidence of diverticulitis. 4. Mild cardiomegaly.  Mild calcification at the mitral valve. 5. Scattered aortic atherosclerosis.   CUS - Bilateral: 1-39% ICA stenosis. Vertebral artery flow is antegrade.  Component     Latest Ref Rng & Units 03/08/2016  Cholesterol     0 - 200 mg/dL 119  Triglycerides     <150 mg/dL 68  HDL Cholesterol     >40 mg/dL 58  Total CHOL/HDL Ratio     RATIO 2.1  VLDL     0 - 40 mg/dL 14  LDL (calc)     0 - 99 mg/dL 47  Vitamin B12     180 - 914 pg/mL 131 (L)  TSH     0.350 - 4.500 uIU/mL 2.332     Assessment: 71 y.o. female with PMH of multiple strokes, cerebral aneurysms s/p pipeline stents with residual aneurysms, CAD, HTN, HLD and former smoker admitted for abdominal pain, worsening diarrhea, malaise and N/V. CT abdomen concerning for cholecystitis. Plan for cholecystectomy Thursday. CUS normal. Okay to hold Plavix for now, continue ASA 81mg  and Lipitor for stroke prevention. Resume plavix once hemodynamically stable after surgery. Avoid perioperative hypotension. Will give IM supplement for low B12.  Plan: - OK for surgery procedure  tomorrow - agree to hold plavix for surgery, recommend to resume Plavix once hemodynamically stable after procedure. - recommend to continue ASA 81mg  and Lipitor for stroke prevention - Avoid perioperative hypotension - will give IM supplement of B12 1039mcg for 5-7 days. Then po B12 1019mcg dialy on discharge.   Neurology will sign off. Please call with questions. Pt will follow up with Dr. [name] and Dr. [name] as scheduled. Thanks for the consult.  Rosalin Hawking, MD PhD Stroke Neurology 03/08/2016 3:12 PM    To contact Stroke Continuity provider, please refer to [email or web address] After hours, contact General Neurology

## 2016-03-08 NOTE — Progress Notes (Signed)
[facility] Progress Note     Subjective: Abdominal pain moderately controlled. No nausea or vomiting. Prefers toradol over morphine. NO acute events overnight.   Objective: Vital signs in last 24 hours: Temp:  [98.5 F (36.9 C)-99.5 F (37.5 C)] 99.5 F (37.5 C) (02/14 0404) Pulse Rate:  [60-71] 60 (02/14 0404) Resp:  [18-19] 18 (02/14 0404) BP: (104-132)/(57-72) 132/72 (02/14 0404) SpO2:  [96 %] 96 % (02/14 0404) Last BM Date: 03/05/16  Intake/Output from previous day: 02/13 0701 - 02/14 0700 In: 2325 [P.O.:600; I.V.:1325; IV Piggyback:400] Out: -  Intake/Output this shift: No intake/output data recorded.  PE: Gen:  Alert, NAD, pleasant, cooperative, well appearing, lying in bed Card:  RRR, no M/G/R heard Pulm:  CTA, no W/R/R, effort normal Abd: Soft, nondistended, +BS, generalized TTP worse in the RUQ Skin: no rashes noted, not diaphoretic, warm and dry  Lab Results:   Recent Labs  03/07/16 0343 03/08/16 0447  WBC 6.5 6.0  HGB 10.1* 9.6*  HCT 30.9* 30.5*  PLT 103* 101*   BMET  Recent Labs  03/07/16 0343 03/08/16 0447  NA 137 136  K 3.0* 3.2*  CL 103 101  CO2 25 27  GLUCOSE 119* 84  BUN 13 11  CREATININE 0.78 0.80  CALCIUM 7.9* 7.7*   PT/INR No results for input(s): LABPROT, INR in the last 72 hours. CMP     Component Value Date/Time   NA 136 03/08/2016 0447   K 3.2 (L) 03/08/2016 0447   CL 101 03/08/2016 0447   CO2 27 03/08/2016 0447   GLUCOSE 84 03/08/2016 0447   BUN 11 03/08/2016 0447   CREATININE 0.80 03/08/2016 0447   CALCIUM 7.7 (L) 03/08/2016 0447   PROT 5.6 (L) 03/08/2016 0447   ALBUMIN 2.8 (L) 03/08/2016 0447   AST 16 03/08/2016 0447   ALT 10 (L) 03/08/2016 0447   ALKPHOS 33 (L) 03/08/2016 0447   BILITOT 1.4 (H) 03/08/2016 0447   GFRNONAA >60 03/08/2016 0447   GFRAA >60 03/08/2016 0447   Lipase     Component Value Date/Time   LIPASE 23 03/06/2016 1927       Studies/Results: US Abdomen Complete  Result  Date: 03/06/2016 CLINICAL DATA:  Initial evaluation for elevated bilirubin with nausea, vomiting, and right upper quadrant pain. EXAM: ABDOMEN ULTRASOUND COMPLETE COMPARISON:  None. FINDINGS: Gallbladder: No gallstones or wall thickening visualized. No sonographic Murphy sign noted by sonographer. Common bile duct: Diameter: 5 mm Liver: No focal lesion identified. Increased echogenicity, suggestive of steatosis. IVC: No abnormality visualized. Pancreas: Visualized portion unremarkable. Spleen: Size and appearance within normal limits. Right Kidney: Length: 10.7 cm. Echogenicity within normal limits. No mass or hydronephrosis visualized. Left Kidney: Length: 12.0 cm. Echogenicity within normal limits. No mass or hydronephrosis visualized. Cortical thinning at the upper pole noted. Abdominal aorta: No aneurysm visualized. Other findings: None. IMPRESSION: 1. Normal sonographic appearance of the gallbladder. No evidence for cholelithiasis, acute cholecystitis, or biliary dilatation. 2. Hepatic steatosis. 3. No other acute abnormality within the abdomen. Electronically Signed   By: Jeannine Boga M.D.   On: 03/06/2016 21:41   Ct Abdomen Pelvis W Contrast  Result Date: 03/07/2016 CLINICAL DATA:  Acute onset of generalized abdominal pain, nausea and vomiting. Initial encounter. EXAM: CT ABDOMEN AND PELVIS WITH CONTRAST TECHNIQUE: Multidetector CT imaging of the abdomen and pelvis was performed using the standard protocol following bolus administration of intravenous contrast. CONTRAST:  100 mL Prelim ISOVUE-300 IOPAMIDOL (ISOVUE-300) INJECTION 61% COMPARISON:  Abdominal ultrasound performed  earlier today at 9:19 p.m. FINDINGS: Lower chest: Mild calcification is noted at the mitral valve. The heart is mildly enlarged. The visualized lung bases are grossly clear. Hepatobiliary: Trace ascites is seen tracking about the liver. The liver is unremarkable in appearance. Note is made of diffuse soft tissue inflammation  about the gallbladder, with associated wall thickening, concerning for acute cholecystitis. Mild soft tissue inflammation extends about the adjacent hepatic flexure of the colon. The common bile duct remains normal in caliber. No definite stones are characterized on CT. Pancreas: The pancreas is within normal limits. Spleen: The spleen is mildly enlarged, measuring 13.9 cm in length. Adrenals/Urinary Tract: The adrenal glands are unremarkable in appearance. The kidneys are within normal limits. There is no evidence of hydronephrosis. No renal or ureteral stones are identified. No perinephric stranding is seen. Stomach/Bowel: The stomach is unremarkable in appearance. The small bowel is within normal limits. The patient is status post appendectomy. Minimal diverticulosis is noted along the proximal sigmoid colon, without evidence of diverticulitis. Trace fluid within the pelvis may be physiologic in nature, or may reflect the acute cholecystitis. Vascular/Lymphatic: Scattered calcification is seen along the abdominal aorta and its branches. The abdominal aorta is otherwise grossly unremarkable. The inferior vena cava is grossly unremarkable. No retroperitoneal lymphadenopathy is seen. No pelvic sidewall lymphadenopathy is identified. Reproductive: The bladder is mildly distended and grossly unremarkable. The patient is status post hysterectomy. No suspicious adnexal masses are seen. The left ovary is unremarkable in appearance. Other: No additional soft tissue abnormalities are seen. Musculoskeletal: No acute osseous abnormalities are identified. The visualized musculature is unremarkable in appearance. IMPRESSION: 1. Acute cholecystitis, with diffuse soft tissue inflammation about the gallbladder and associated wall thickening. Trace associated ascites seen tracking about the liver. Mild soft tissue inflammation extends about the adjacent hepatic flexure of the colon. No definite stones characterized on CT, though  evaluation for stones is limited on CT. 2. Mild splenomegaly. 3. Minimal diverticulosis along the proximal sigmoid colon, without evidence of diverticulitis. 4. Mild cardiomegaly.  Mild calcification at the mitral valve. 5. Scattered aortic atherosclerosis. Electronically Signed   By: Garald Balding M.D.   On: 03/07/2016 00:29    Anti-infectives: Anti-infectives    Start     Dose/Rate Route Frequency Ordered Stop   03/07/16 1800  ciprofloxacin (CIPRO) IVPB 400 mg     400 mg 200 mL/hr over 60 Minutes Intravenous Every 12 hours 03/07/16 0850     03/07/16 0100  ciprofloxacin (CIPRO) IVPB 400 mg     400 mg 200 mL/hr over 60 Minutes Intravenous  Once 03/07/16 0047 03/07/16 0406   03/07/16 0100  metroNIDAZOLE (FLAGYL) IVPB 500 mg     500 mg 100 mL/hr over 60 Minutes Intravenous  Once 03/07/16 0047 03/07/16 0229   03/07/16 0000  metroNIDAZOLE (FLAGYL) tablet 500 mg  Status:  Discontinued     500 mg Oral  Once 03/06/16 2358 03/07/16 0046   03/07/16 0000  ciprofloxacin (CIPRO) tablet 500 mg  Status:  Discontinued     500 mg Oral  Once 03/06/16 2358 03/07/16 0046       Assessment/Plan  Abdominal pain Possible cholecystitis - abdominal surg hx includes hysterectomy, appendectomy and right oophrectomy >> all this was done during the same procedure - CT reports acute cholecystitis with diffuse soft tissue inflammation about the gallbladder and associated wall thickening, no definite stones - u/s shows no evidence for cholelithiasis, acute cholecystitis, or biliary dilatation - bilirubin same this morning 1.5 -  no leukocytosis, afebrile   H/o multiple strokes and TIA's - on plavix, last dose 37.5mg  on 2/11; followed by Dr. [name] HTN  COPD - daily albuterol inhaler Previous h/o pancreatitis  Hypokalemia: PO replacement BID if pt can tolerate PO, if not will replenish IV, recheck in AM  ID - cipro 2/13>>, flagyl x1 dose 2/13 FEN - IVF, clear liquids, NPO at midnight VTE - SCDs  Plan -  Potassium replenishment. AM labs.  Hold plavix and plan for OR Thursday;  neurology states OK for surgery, will repeat carotid doppler, continue ASA 81mg , continue lipitor. Thank you for your recommendations.     LOS: 1 day    Kalman Drape , [facility] 03/08/2016, 7:33 AM Pager: [phone] Consults: [phone] Mon-Fri 7:00 am-4:30 pm Sat-Sun 7:00 am-11:30 am

## 2016-03-08 NOTE — Progress Notes (Signed)
**  Preliminary report by tech**  Carotid artery duplex complete. Findings are consistent with a 1-39 percent stenosis involving the right internal carotid artery and the left internal carotid artery. The vertebral arteries demonstrate antegrade flow.  03/08/16 2:58 PM Jeanne Lawson RVT

## 2016-03-09 ENCOUNTER — Inpatient Hospital Stay (HOSPITAL_COMMUNITY): Payer: Medicare Other | Admitting: Certified Registered"

## 2016-03-09 ENCOUNTER — Encounter (HOSPITAL_COMMUNITY): Payer: Self-pay | Admitting: Certified Registered"

## 2016-03-09 ENCOUNTER — Encounter (HOSPITAL_COMMUNITY): Admission: EM | Disposition: A | Discharge status: Home / Self Care | Payer: Self-pay | Source: Home / Self Care

## 2016-03-09 HISTORY — PX: CHOLECYSTECTOMY: SHX55

## 2016-03-09 LAB — CBC
HCT: 27.9 % — ABNORMAL LOW (ref 36.0–46.0)
Hemoglobin: 9.1 g/dL — ABNORMAL LOW (ref 12.0–15.0)
MCH: 27.8 pg (ref 26.0–34.0)
MCHC: 32.6 g/dL (ref 30.0–36.0)
MCV: 85.3 fL (ref 78.0–100.0)
PLATELETS: 107 10*3/uL — AB (ref 150–400)
RBC: 3.27 MIL/uL — ABNORMAL LOW (ref 3.87–5.11)
RDW: 15.4 % (ref 11.5–15.5)
WBC: 4 10*3/uL (ref 4.0–10.5)

## 2016-03-09 LAB — VAS US CAROTID
LCCADDIAS: 20 cm/s
LCCAPSYS: -85 cm/s
LEFT ECA DIAS: -12 cm/s
LEFT VERTEBRAL DIAS: 16 cm/s
LICADDIAS: -34 cm/s
LICADSYS: -95 cm/s
LICAPDIAS: 18 cm/s
LICAPSYS: 77 cm/s
Left CCA dist sys: 75 cm/s
Left CCA prox dias: -20 cm/s
RIGHT ECA DIAS: 11 cm/s
RIGHT VERTEBRAL DIAS: -6 cm/s
Right CCA prox dias: -13 cm/s
Right CCA prox sys: -65 cm/s
Right cca dist sys: -72 cm/s

## 2016-03-09 LAB — COMPREHENSIVE METABOLIC PANEL
ALBUMIN: 2.6 g/dL — AB (ref 3.5–5.0)
ALT: 10 U/L — ABNORMAL LOW (ref 14–54)
ANION GAP: 7 (ref 5–15)
AST: 14 U/L — ABNORMAL LOW (ref 15–41)
Alkaline Phosphatase: 32 U/L — ABNORMAL LOW (ref 38–126)
BUN: 9 mg/dL (ref 6–20)
CO2: 25 mmol/L (ref 22–32)
Calcium: 7.6 mg/dL — ABNORMAL LOW (ref 8.9–10.3)
Chloride: 105 mmol/L (ref 101–111)
Creatinine, Ser: 0.84 mg/dL (ref 0.44–1.00)
GFR calc Af Amer: >60 mL/min (ref >60)
GFR calc non Af Amer: >60 mL/min (ref >60)
GLUCOSE: 87 mg/dL (ref 65–99)
POTASSIUM: 3.5 mmol/L (ref 3.5–5.1)
SODIUM: 137 mmol/L (ref 135–145)
TOTAL PROTEIN: 5.3 g/dL — AB (ref 6.5–8.1)
Total Bilirubin: 1.3 mg/dL — ABNORMAL HIGH (ref 0.3–1.2)

## 2016-03-09 LAB — HEMOGLOBIN A1C
HEMOGLOBIN A1C: 5.1 % (ref 4.8–5.6)
MEAN PLASMA GLUCOSE: 100 mg/dL

## 2016-03-09 SURGERY — LAPAROSCOPIC CHOLECYSTECTOMY WITH INTRAOPERATIVE CHOLANGIOGRAM
Anesthesia: General | Site: Abdomen

## 2016-03-09 SURGERY — Surgical Case
Anesthesia: *Unknown

## 2016-03-09 MED ORDER — LIDOCAINE 2% (20 MG/ML) 5 ML SYRINGE
INTRAMUSCULAR | Status: AC
  Filled 2016-03-09: qty 5

## 2016-03-09 MED ORDER — FENTANYL CITRATE (PF) 100 MCG/2ML IJ SOLN
INTRAMUSCULAR | Status: AC
  Administered 2016-03-09: 25 ug via INTRAVENOUS
  Filled 2016-03-09: qty 2

## 2016-03-09 MED ORDER — LABETALOL HCL 5 MG/ML IV SOLN
INTRAVENOUS | Status: DC | PRN
  Administered 2016-03-09: 10 mg via INTRAVENOUS

## 2016-03-09 MED ORDER — ROCURONIUM BROMIDE 50 MG/5ML IV SOSY
PREFILLED_SYRINGE | INTRAVENOUS | Status: AC
  Filled 2016-03-09: qty 5

## 2016-03-09 MED ORDER — FENTANYL CITRATE (PF) 100 MCG/2ML IJ SOLN
INTRAMUSCULAR | Status: AC
  Filled 2016-03-09: qty 4

## 2016-03-09 MED ORDER — MIDAZOLAM HCL 5 MG/5ML IJ SOLN
INTRAMUSCULAR | Status: DC | PRN
  Administered 2016-03-09: 2 mg via INTRAVENOUS

## 2016-03-09 MED ORDER — ONDANSETRON HCL 4 MG/2ML IJ SOLN
INTRAMUSCULAR | Status: AC
  Filled 2016-03-09: qty 2

## 2016-03-09 MED ORDER — DEXAMETHASONE SODIUM PHOSPHATE 10 MG/ML IJ SOLN
INTRAMUSCULAR | Status: DC | PRN
  Administered 2016-03-09: 5 mg via INTRAVENOUS

## 2016-03-09 MED ORDER — HEMOSTATIC AGENTS (NO CHARGE) OPTIME
TOPICAL | Status: DC | PRN
  Administered 2016-03-09: 1 via TOPICAL

## 2016-03-09 MED ORDER — PROMETHAZINE HCL 25 MG/ML IJ SOLN
INTRAMUSCULAR | Status: AC
  Filled 2016-03-09: qty 1

## 2016-03-09 MED ORDER — LIDOCAINE 2% (20 MG/ML) 5 ML SYRINGE
INTRAMUSCULAR | Status: DC | PRN
  Administered 2016-03-09: 100 mg via INTRAVENOUS

## 2016-03-09 MED ORDER — FENTANYL CITRATE (PF) 100 MCG/2ML IJ SOLN
25.0000 ug | INTRAMUSCULAR | Status: DC | PRN
  Administered 2016-03-09: 50 ug via INTRAVENOUS
  Administered 2016-03-09 (×2): 25 ug via INTRAVENOUS

## 2016-03-09 MED ORDER — DIPHENHYDRAMINE HCL 50 MG/ML IJ SOLN
INTRAMUSCULAR | Status: DC | PRN
  Administered 2016-03-09: 10 mg via INTRAVENOUS

## 2016-03-09 MED ORDER — 0.9 % SODIUM CHLORIDE (POUR BTL) OPTIME
TOPICAL | Status: DC | PRN
Start: 2016-03-09 — End: 2016-03-09
  Administered 2016-03-09: 1000 mL

## 2016-03-09 MED ORDER — MIDAZOLAM HCL 2 MG/2ML IJ SOLN
INTRAMUSCULAR | Status: AC
  Filled 2016-03-09: qty 2

## 2016-03-09 MED ORDER — FENTANYL CITRATE (PF) 100 MCG/2ML IJ SOLN
INTRAMUSCULAR | Status: AC
  Filled 2016-03-09: qty 2

## 2016-03-09 MED ORDER — ONDANSETRON HCL 4 MG/2ML IJ SOLN
INTRAMUSCULAR | Status: DC | PRN
  Administered 2016-03-09: 4 mg via INTRAVENOUS

## 2016-03-09 MED ORDER — SUGAMMADEX SODIUM 200 MG/2ML IV SOLN
INTRAVENOUS | Status: DC | PRN
  Administered 2016-03-09: 100 mg via INTRAVENOUS

## 2016-03-09 MED ORDER — PROPOFOL 10 MG/ML IV BOLUS
INTRAVENOUS | Status: AC
  Filled 2016-03-09: qty 20

## 2016-03-09 MED ORDER — FENTANYL CITRATE (PF) 100 MCG/2ML IJ SOLN
INTRAMUSCULAR | Status: DC | PRN
  Administered 2016-03-09 (×2): 50 ug via INTRAVENOUS
  Administered 2016-03-09: 100 ug via INTRAVENOUS

## 2016-03-09 MED ORDER — ROCURONIUM BROMIDE 10 MG/ML (PF) SYRINGE
PREFILLED_SYRINGE | INTRAVENOUS | Status: DC | PRN
  Administered 2016-03-09: 40 mg via INTRAVENOUS

## 2016-03-09 MED ORDER — DIPHENHYDRAMINE HCL 50 MG/ML IJ SOLN
INTRAMUSCULAR | Status: AC
  Filled 2016-03-09: qty 1

## 2016-03-09 MED ORDER — PROPOFOL 10 MG/ML IV BOLUS
INTRAVENOUS | Status: DC | PRN
  Administered 2016-03-09: 20 mg via INTRAVENOUS
  Administered 2016-03-09: 150 mg via INTRAVENOUS

## 2016-03-09 MED ORDER — BUPIVACAINE HCL (PF) 0.25 % IJ SOLN
INTRAMUSCULAR | Status: DC | PRN
  Administered 2016-03-09: 19 mL

## 2016-03-09 MED ORDER — BUPIVACAINE HCL (PF) 0.25 % IJ SOLN
INTRAMUSCULAR | Status: AC
  Filled 2016-03-09: qty 30

## 2016-03-09 MED ORDER — LACTATED RINGERS IV SOLN
INTRAVENOUS | Status: DC
  Administered 2016-03-09 (×2): via INTRAVENOUS

## 2016-03-09 MED ORDER — IOPAMIDOL (ISOVUE-300) INJECTION 61%
INTRAVENOUS | Status: AC
  Filled 2016-03-09: qty 50

## 2016-03-09 MED ORDER — DEXAMETHASONE SODIUM PHOSPHATE 10 MG/ML IJ SOLN
INTRAMUSCULAR | Status: AC
  Filled 2016-03-09: qty 1

## 2016-03-09 MED ORDER — SODIUM CHLORIDE 0.9 % IR SOLN
Status: DC | PRN
  Administered 2016-03-09 (×2): 1000 mL

## 2016-03-09 MED ORDER — SUGAMMADEX SODIUM 200 MG/2ML IV SOLN
INTRAVENOUS | Status: AC
  Filled 2016-03-09: qty 2

## 2016-03-09 SURGICAL SUPPLY — 44 items
ADH SKN CLS APL DERMABOND .7 (GAUZE/BANDAGES/DRESSINGS) ×1
APPLIER CLIP 5 13 M/L LIGAMAX5 (MISCELLANEOUS) ×3
APR CLP MED LRG 5 ANG JAW (MISCELLANEOUS) ×1
BAG SPEC RTRVL 10 TROC 200 (ENDOMECHANICALS) ×1
CANISTER SUCTION 2500CC (MISCELLANEOUS) ×3 IMPLANT
CHLORAPREP W/TINT 26ML (MISCELLANEOUS) ×3 IMPLANT
CLIP APPLIE 5 13 M/L LIGAMAX5 (MISCELLANEOUS) ×1 IMPLANT
COVER SURGICAL LIGHT HANDLE (MISCELLANEOUS) ×3 IMPLANT
DECANTER SPIKE VIAL GLASS SM (MISCELLANEOUS) ×2 IMPLANT
DERMABOND ADVANCED (GAUZE/BANDAGES/DRESSINGS) ×2
DERMABOND ADVANCED .7 DNX12 (GAUZE/BANDAGES/DRESSINGS) ×1 IMPLANT
ELECT REM PT RETURN 9FT ADLT (ELECTROSURGICAL) ×3
ELECTRODE REM PT RTRN 9FT ADLT (ELECTROSURGICAL) ×1 IMPLANT
FILTER SMOKE EVAC LAPAROSHD (FILTER) ×2 IMPLANT
GLOVE BIO SURGEON STRL SZ8 (GLOVE) ×1 IMPLANT
GLOVE BIOGEL PI IND STRL 8 (GLOVE) ×1 IMPLANT
GLOVE BIOGEL PI INDICATOR 8 (GLOVE) ×2
GLOVE SURG SS PI 7.0 STRL IVOR (GLOVE) ×6 IMPLANT
GLOVE SURG SS PI 8.0 STRL IVOR (GLOVE) ×2 IMPLANT
GOWN STRL REUS W/ TWL LRG LVL3 (GOWN DISPOSABLE) ×2 IMPLANT
GOWN STRL REUS W/ TWL XL LVL3 (GOWN DISPOSABLE) ×1 IMPLANT
GOWN STRL REUS W/TWL LRG LVL3 (GOWN DISPOSABLE) ×6
GOWN STRL REUS W/TWL XL LVL3 (GOWN DISPOSABLE) ×3
KIT BASIN OR (CUSTOM PROCEDURE TRAY) ×3 IMPLANT
KIT ROOM TURNOVER OR (KITS) ×3 IMPLANT
L-HOOK LAP DISP 36CM (ELECTROSURGICAL) ×3
LHOOK LAP DISP 36CM (ELECTROSURGICAL) ×1 IMPLANT
NEEDLE 22X1 1/2 (OR ONLY) (NEEDLE) ×3 IMPLANT
NS IRRIG 1000ML POUR BTL (IV SOLUTION) ×3 IMPLANT
PAD ARMBOARD 7.5X6 YLW CONV (MISCELLANEOUS) ×3 IMPLANT
PENCIL BUTTON HOLSTER BLD 10FT (ELECTRODE) ×3 IMPLANT
POUCH RETRIEVAL ECOSAC 10 (ENDOMECHANICALS) ×1 IMPLANT
POUCH RETRIEVAL ECOSAC 10MM (ENDOMECHANICALS) ×2
SCISSORS LAP 5X35 DISP (ENDOMECHANICALS) ×3 IMPLANT
SET IRRIG TUBING LAPAROSCOPIC (IRRIGATION / IRRIGATOR) ×3 IMPLANT
SLEEVE ENDOPATH XCEL 5M (ENDOMECHANICALS) ×6 IMPLANT
SPECIMEN JAR SMALL (MISCELLANEOUS) ×3 IMPLANT
SUT VIC AB 4-0 PS2 27 (SUTURE) ×3 IMPLANT
TOWEL OR 17X24 6PK STRL BLUE (TOWEL DISPOSABLE) ×3 IMPLANT
TOWEL OR 17X26 10 PK STRL BLUE (TOWEL DISPOSABLE) ×3 IMPLANT
TRAY LAPAROSCOPIC MC (CUSTOM PROCEDURE TRAY) ×3 IMPLANT
TROCAR XCEL BLUNT TIP 100MML (ENDOMECHANICALS) ×3 IMPLANT
TROCAR XCEL NON-BLD 5MMX100MML (ENDOMECHANICALS) ×3 IMPLANT
TUBING INSUFFLATION (TUBING) ×3 IMPLANT

## 2016-03-09 NOTE — Transfer of Care (Signed)
Immediate Anesthesia Transfer of Care Note  Patient: Jeanne Lawson  Procedure(s) Performed: Procedure(s): LAPAROSCOPIC CHOLECYSTECTOMY WITH INTRAOPERATIVE CHOLANGIOGRAM (N/A)  Patient Location: PACU  Anesthesia Type:General  Level of Consciousness: sedated and patient cooperative  Airway & Oxygen Therapy: Patient Spontanous Breathing and Patient connected to nasal cannula oxygen  Post-op Assessment: Report given to RN, Post -op Vital signs reviewed and stable and Patient moving all extremities  Post vital signs: Reviewed and stable  Last Vitals:  Vitals:   03/09/16 0512 03/09/16 1125  BP: 128/78 (!) 154/69  Pulse: 84 78  Resp: 18 18  Temp: 37.2 C 36.3 C    Last Pain:  Vitals:   03/09/16 1125  TempSrc: Other (Comment)  PainSc:       Patients Stated Pain Goal: 1 ([id number])  Complications: No apparent anesthesia complications

## 2016-03-09 NOTE — Anesthesia Procedure Notes (Signed)
Procedure Name: Intubation Date/Time: 03/09/2016 1:29 PM Performed by: Melina Copa, Marcel Gary R Pre-anesthesia Checklist: Patient identified, Emergency Drugs available, Suction available and Patient being monitored Patient Re-evaluated:Patient Re-evaluated prior to inductionOxygen Delivery Method: Circle System Utilized Preoxygenation: Pre-oxygenation with 100% oxygen Intubation Type: IV induction Ventilation: Mask ventilation without difficulty Laryngoscope Size: Mac and 3 Grade View: Grade III Tube type: Oral Tube size: 7.5 mm Number of attempts: 1 Airway Equipment and Method: Stylet Placement Confirmation: ETT inserted through vocal cords under direct vision,  positive ETCO2 and breath sounds checked- equal and bilateral Secured at: 21 cm Tube secured with: Tape Dental Injury: Teeth and Oropharynx as per pre-operative assessment

## 2016-03-09 NOTE — Op Note (Signed)
03/06/2016 - 03/09/2016  3:02 PM  PATIENT:  Jeanne Lawson  71 y.o. female  PRE-OPERATIVE DIAGNOSIS:  Cholecystitis with cholelithiasis  POST-OPERATIVE DIAGNOSIS:  Cholecystitis with cholelithiasis, hydrops of the gallbladder  PROCEDURE:  Procedure(s): LAPAROSCOPIC CHOLECYSTECTOMY  SURGEON:  Surgeon(s): Georganna Skeans, MD  ASSISTANTS: none   ANESTHESIA:   local and general  EBL:  Total I/O In: 1000 [I.V.:1000] Out: 1025 [Urine:1025]  BLOOD ADMINISTERED:none  DRAINS: none   SPECIMEN:  Excision  DISPOSITION OF SPECIMEN:  PATHOLOGY  COUNTS:  YES  DICTATION: .Dragon Dictation Findings: Severe cholecystitis with some necrosis of the back wall of the gallbladder and hydrops  Procedure in detail: Mystic presents for cholecystectomy. She was identified in the preop holding area. Informed consent was obtained. She has been receiving IV antibiotics per protocol. She was brought to the operating room and general endotracheal anesthesia was administered by the anesthesia staff. Her abdomen was prepped and draped in a sterile fashion. Time out procedure was performed.The supraumbilical region was infiltrated with local. supraumbilical incision was made. Subcutaneous tissues were dissected down revealing the anterior fascia. This was divided sharply along the midline. Peritoneal cavity was entered under direct vision without complication. A 0 Vicryl pursestring was placed around the fascial opening. Hassan trocar was inserted into the abdomen. The abdomen was insufflated with carbon dioxide in standard fashion. Under direct vision a 5 mm epigastric and 2 5 mm right-sided port were placed. Local was used at each port site. Laparoscopic expiration revealed the gallbladder to be very inflamed. It was tensely distended. It was drained with the Nahzhat drainage system revealing purulent bile consistent with hydrops of the gallbladder. The dome the gallbladder was then retracted superior medially.  There were omental adhesions to the body of the gallbladder which were gently swept away. The infundibulum was retracted inferior laterally. Dissection began laterally and progressed medially identifying the cystic duct and the cystic artery. The cystic duct was quite short, only about a centimeter. Further dissection identified the cystic artery more clearly and it was clipped twice proximally and once distally and divided. Dissection of the cystic duct continued until we had a critical view between the cystic duct, the infundibulum, and the liver. Decision was made to not do a cholangiogram due to how short it was. 3 clips were placed proximally on the cystic duct. It was divided. The gallbladder was taken off the liver bed using Bovie cautery. The back wall was partially necrotic. We identified a small posterior branch of the cystic artery which was clipped on the proximal side. Gallbladder was taken off the liver bed the rest away with cautery and it was placed in a bag and removed from the abdomen. It was sent to pathology. The liver bed was cauterized to get excellent hemostasis. A piece of Surgicel snow was placed on the liver bed as well. The area was copiously irrigated and irrigation fluid returned clear. Liver bed was rechecked and appeared dry. Clips remained in good position. Ports were removed under direct vision. Pneumoperitoneum was released. Supraumbilical fascia was closed by tying the pursestring. All 4 wounds were irrigated and the skin of each was closed with running 4 Vicryl subcuticular followed by Dermabond. All counts were correct. She tolerated the procedure well without apparent complication and she was taken recovery in stable condition.  PATIENT DISPOSITION:  PACU - hemodynamically stable.   Delay start of Pharmacological VTE agent (>24hrs) due to surgical blood loss or risk of bleeding:  no  Georganna Skeans, MD,  MPH, FACS Pager: [phone]  2/15/20183:02  PM

## 2016-03-09 NOTE — Progress Notes (Signed)
  Subjective: mild pain RUQ  Objective: Vital signs in last 24 hours: Temp:  [98.4 F (36.9 C)-99 F (37.2 C)] 99 F (37.2 C) (02/15 0512) Pulse Rate:  [78-84] 84 (02/15 0512) Resp:  [18] 18 (02/15 0512) BP: (118-128)/(67-78) 128/78 (02/15 0512) SpO2:  [94 %-99 %] 94 % (02/15 0512) Last BM Date: 03/08/16  Intake/Output from previous day: 02/14 0701 - 02/15 0700 In: 1235 [I.V.:1235] Out: 450 [Urine:450] Intake/Output this shift: No intake/output data recorded.  General appearance: cooperative Resp: clear to auscultation bilaterally Cardio: regular rate and rhythm GI: soft, mild RUQ tenderness  Lab Results:   Recent Labs  03/08/16 0447 03/09/16 0352  WBC 6.0 4.0  HGB 9.6* 9.1*  HCT 30.5* 27.9*  PLT 101* 107*   BMET  Recent Labs  03/08/16 0447 03/09/16 0352  NA 136 137  K 3.2* 3.5  CL 101 105  CO2 27 25  GLUCOSE 84 87  BUN 11 9  CREATININE 0.80 0.84  CALCIUM 7.7* 7.6*   PT/INR No results for input(s): LABPROT, INR in the last 72 hours. ABG No results for input(s): PHART, HCO3 in the last 72 hours.  Invalid input(s): PCO2, PO2  Studies/Results: No results found.  Anti-infectives: Anti-infectives    Start     Dose/Rate Route Frequency Ordered Stop   03/07/16 1800  ciprofloxacin (CIPRO) IVPB 400 mg     400 mg 200 mL/hr over 60 Minutes Intravenous Every 12 hours 03/07/16 0850     03/07/16 0100  ciprofloxacin (CIPRO) IVPB 400 mg     400 mg 200 mL/hr over 60 Minutes Intravenous  Once 03/07/16 0047 03/07/16 0406   03/07/16 0100  metroNIDAZOLE (FLAGYL) IVPB 500 mg     500 mg 100 mL/hr over 60 Minutes Intravenous  Once 03/07/16 0047 03/07/16 0229   03/07/16 0000  metroNIDAZOLE (FLAGYL) tablet 500 mg  Status:  Discontinued     500 mg Oral  Once 03/06/16 2358 03/07/16 0046   03/07/16 0000  ciprofloxacin (CIPRO) tablet 500 mg  Status:  Discontinued     500 mg Oral  Once 03/06/16 2358 03/07/16 0046      Assessment/Plan: Abdominal  pain Cholecystitis - for laparoscopic cholecystectomy, possible cholangiogram today. Procedure, risks, and benefits discussed again. She agrees. H/o multiple strokes and TIA's - Plavix held, on ASA, appreciate Stroke Team consult Hypokalemia: PO replacement BID ID - cipro 2/13>>, flagyl x1 dose 2/13 FEN - NPO for OR VTE - SCDs   LOS: 2 days    Brynnlee Cumpian E 03/09/2016

## 2016-03-09 NOTE — Anesthesia Postprocedure Evaluation (Addendum)
Anesthesia Post Note  Patient: Jeanne Lawson  Procedure(s) Performed: Procedure(s) (LRB): LAPAROSCOPIC CHOLECYSTECTOMY WITH INTRAOPERATIVE CHOLANGIOGRAM (N/A)  Patient location during evaluation: PACU Anesthesia Type: General Level of consciousness: awake and alert, oriented and patient cooperative Pain management: pain level controlled Vital Signs Assessment: post-procedure vital signs reviewed and stable Respiratory status: spontaneous breathing, nonlabored ventilation, respiratory function stable and patient connected to nasal cannula oxygen Cardiovascular status: blood pressure returned to baseline and stable Postop Assessment: no signs of nausea or vomiting Anesthetic complications: no       Last Vitals:  Vitals:   03/09/16 1125 03/09/16 1515  BP: (!) 154/69 132/70  Pulse: 78 79  Resp: 18 19  Temp: 36.3 C 36.4 C    Last Pain:  Vitals:   03/09/16 1515  TempSrc:   PainSc: 0-No pain                 Cambria Osten,E. Jireh Elmore

## 2016-03-09 NOTE — Anesthesia Preprocedure Evaluation (Addendum)
Anesthesia Evaluation  Patient identified by MRN, date of birth, ID band Patient awake    Reviewed: Allergy & Precautions, NPO status , Patient's Chart, lab work & pertinent test results  History of Anesthesia Complications (+) PONV and history of anesthetic complications  Airway Mallampati: I  TM Distance: >3 FB Neck ROM: Full    Dental  (+) Partial Lower, Partial Upper, Dental Advisory Given   Pulmonary COPD, former smoker,    breath sounds clear to auscultation       Cardiovascular hypertension, Pt. on medications + CAD and + Peripheral Vascular Disease   Rhythm:Regular Rate:Normal     Neuro/Psych TIACVA negative psych ROS   GI/Hepatic negative GI ROS, Neg liver ROS,   Endo/Other  negative endocrine ROS  Renal/GU negative Renal ROS  negative genitourinary   Musculoskeletal negative musculoskeletal ROS (+)   Abdominal   Peds negative pediatric ROS (+)  Hematology negative hematology ROS (+)   Anesthesia Other Findings   Reproductive/Obstetrics negative OB ROS                            Lab Results  Component Value Date   WBC 4.0 03/09/2016   HGB 9.1 (L) 03/09/2016   HCT 27.9 (L) 03/09/2016   MCV 85.3 03/09/2016   PLT 107 (L) 03/09/2016   Lab Results  Component Value Date   CREATININE 0.84 03/09/2016   BUN 9 03/09/2016   NA 137 03/09/2016   K 3.5 03/09/2016   CL 105 03/09/2016   CO2 25 03/09/2016   Lab Results  Component Value Date   INR 1.01 03/12/2015   INR 1.09 11/13/2014   INR 1.21 08/12/2014   07/2015 Echo - Left ventricle: The cavity size was normal. Wall thickness was   increased in a pattern of moderate LVH. Systolic function was   normal. The estimated ejection fraction was in the range of 60%   to 65%. Wall motion was normal; there were no regional wall   motion abnormalities. Doppler parameters are consistent with   abnormal left ventricular relaxation  (grade 1 diastolic   dysfunction).  Anesthesia Physical Anesthesia Plan  ASA: II  Anesthesia Plan: General   Post-op Pain Management:    Induction: Intravenous  Airway Management Planned: Oral ETT  Additional Equipment:   Intra-op Plan:   Post-operative Plan: Extubation in OR  Informed Consent: I have reviewed the patients History and Physical, chart, labs and discussed the procedure including the risks, benefits and alternatives for the proposed anesthesia with the patient or authorized representative who has indicated his/her understanding and acceptance.   Dental advisory given  Plan Discussed with: CRNA  Anesthesia Plan Comments:         Anesthesia Quick Evaluation

## 2016-03-10 ENCOUNTER — Encounter (HOSPITAL_COMMUNITY): Payer: Self-pay | Admitting: General Surgery

## 2016-03-10 MED ORDER — CLOPIDOGREL BISULFATE 75 MG PO TABS
37.5000 mg | ORAL_TABLET | ORAL | Status: DC
  Administered 2016-03-10: 37.5 mg via ORAL
  Filled 2016-03-10: qty 1

## 2016-03-10 NOTE — Progress Notes (Addendum)
1 Day Post-Op  Subjective: Mild nausea but took some clears  Objective: Vital signs in last 24 hours: Temp:  [97.4 F (36.3 C)-98.5 F (36.9 C)] 98.3 F (36.8 C) (02/16 0536) Pulse Rate:  [68-84] 75 (02/16 0536) Resp:  [18-21] 18 (02/16 0536) BP: (113-155)/(45-79) 131/68 (02/16 0536) SpO2:  [92 %-100 %] 94 % (02/16 0536) Last BM Date: 03/08/16  Intake/Output from previous day: 02/15 0701 - 02/16 0700 In: 2337.5 [P.O.:60; I.V.:2277.5] Out: 1825 [Urine:1750; Blood:75] Intake/Output this shift: Total I/O In: 240 [P.O.:240] Out: -   General appearance: alert and cooperative Resp: clear to auscultation bilaterally GI: soft, incisions CDI  Lab Results:   Recent Labs  03/08/16 0447 03/09/16 0352  WBC 6.0 4.0  HGB 9.6* 9.1*  HCT 30.5* 27.9*  PLT 101* 107*   BMET  Recent Labs  03/08/16 0447 03/09/16 0352  NA 136 137  K 3.2* 3.5  CL 101 105  CO2 27 25  GLUCOSE 84 87  BUN 11 9  CREATININE 0.80 0.84  CALCIUM 7.7* 7.6*   PT/INR No results for input(s): LABPROT, INR in the last 72 hours. ABG No results for input(s): PHART, HCO3 in the last 72 hours.  Invalid input(s): PCO2, PO2  Studies/Results: No results found.  Anti-infectives: Anti-infectives    Start     Dose/Rate Route Frequency Ordered Stop   03/07/16 1800  ciprofloxacin (CIPRO) IVPB 400 mg     400 mg 200 mL/hr over 60 Minutes Intravenous Every 12 hours 03/07/16 0850     03/07/16 0100  ciprofloxacin (CIPRO) IVPB 400 mg     400 mg 200 mL/hr over 60 Minutes Intravenous  Once 03/07/16 0047 03/07/16 0406   03/07/16 0100  metroNIDAZOLE (FLAGYL) IVPB 500 mg     500 mg 100 mL/hr over 60 Minutes Intravenous  Once 03/07/16 0047 03/07/16 0229   03/07/16 0000  metroNIDAZOLE (FLAGYL) tablet 500 mg  Status:  Discontinued     500 mg Oral  Once 03/06/16 2358 03/07/16 0046   03/07/16 0000  ciprofloxacin (CIPRO) tablet 500 mg  Status:  Discontinued     500 mg Oral  Once 03/06/16 2358 03/07/16 0046       Assessment/Plan: s/p Procedure(s): LAPAROSCOPIC CHOLECYSTECTOMY WITH INTRAOPERATIVE CHOLANGIOGRAM (N/A) POD#1 Resume Plavix Advance diet Continue Cipro 2 more doses then stop Likely home tomorrow - daughter is coming from out of town to stay with her  LOS: 3 days    Catherin Doorn E 03/10/2016

## 2016-03-10 NOTE — Discharge Instructions (Signed)

## 2016-03-10 NOTE — Care Management Note (Signed)
Case Management Note  Patient Details  Name: Jeanne Lawson MRN: [id number] Date of Birth: 1945/04/19  Subjective/Objective:                    Action/Plan:   Expected Discharge Date:                  Expected Discharge Plan:  Home/Self Care  In-House Referral:     Discharge planning Services     Post Acute Care Choice:    Choice offered to:     DME Arranged:    DME Agency:     HH Arranged:    [place] Agency:     Status of Service:  Completed, signed off  If discussed at [employer] of Stay Meetings, dates discussed:    Additional Comments:  Marilu Favre, RN 03/10/2016, 10:23 AM

## 2016-03-11 MED ORDER — TRAMADOL HCL 50 MG PO TABS: 50.0000 mg | ORAL_TABLET | Freq: Two times a day (BID) | ORAL | Status: DC | PRN

## 2016-03-11 MED ORDER — TRAMADOL HCL 50 MG PO TABS: 50.0000 mg | ORAL_TABLET | Freq: Two times a day (BID) | ORAL | 0 refills | Status: DC | PRN

## 2016-03-11 NOTE — Discharge Summary (Signed)
Patient ID: Jeanne Lawson [id number] 71 y.o. 1945/08/13  Admit date: 03/06/2016  Discharge date and time: 03/12/2015  Admitting Physician: Jeanne Lawson  Discharge Physician: Jeanne Lawson  Admission Diagnoses: Cholecystitis [K81.9]  Discharge Diagnoses: Acute and chronic cholecystitis with cholelithiasis                                         Coronary artery disease                                          COPD                                          History of previous thrombotic stroke                                          History of TIA                                          Tobacco abuse                                          History carotid endarterectomy                                          History appendectomy                                          History abdominal hysterectomy                                            Operations: Procedure(s): LAPAROSCOPIC CHOLECYSTECTOMY WITH INTRAOPERATIVE CHOLANGIOGRAM  Admission Condition: fair  Discharged Condition: good  Indication for Admission: This is a 71 year old woman with the comorbidities listed above.  She presents with a 3 day history of malaise and abdominal pain.  She's also had nausea and vomiting.  Subjectively had fevers.  Has diarrhea chronically.  CT scan showed acute cholecystitis, mild splenomegaly, mild cardiomegaly.  Ultrasound was negative for stones. She was admitted for further evaluation and management of a presumed cholecystitis.  Lab work showed mild elevation of liver function tests.  Hospital Course: The patient was admitted, started on IV antibiotics, IV fluids, analgesics, and anti-emetics.  Plavix was held.  The patient was seen in consultation by neurology, who noted her past history of multiple strokes, cerebral aneurysm with stents, coronary artery disease, hypertension, and former smoker.  Jeanne Lawson with holding the Plavix.  Aspirin 81 mg was continued.  No heparin.  Continue  Lipitor.  Carotid Doppler  was performed which showed  86 or less percent stenosis involving right internal carotid artery and left internal carotid artery.  Vertebral arteries demonstrate antegrade flow.   .  They felt she was stable from a neurologic standpoint to have gallbladder surgery.       On 03/09/2016 she underwent laparoscopic cholecystectomy.  The gallbladder was described as being very inflamed and distended and had to be drained with a suction catheter.  The surgery was otherwise uneventful.      On postop day 1 she was having some nausea but doing okay.  Plavix was resumed.  She was given antibiotics for 24 hours.  She was ready to go home on postop day 2, February 16.  At that time she was and living independently, voiding uneventfully.  Had no shortness of breath or abdominal pain.  Was tolerating diet.  Abdomen was soft and wounds looked good. She stated she would like to avoid opioids and so we gave her a prescription for tramadol for pain and asked her to use that sparingly.  She will otherwise continue all of her usual medications.  She was given an appointment to follow-up with our office on October 27.  Diet and activities were discussed.       Consults: neurology  Significant Diagnostic Studies: Lab work.  X-rays.  Carotid Dopplers.  Surgical pathology.    Treatments: surgery: Laparoscopic cholecystectomy   Disposition: Home  Patient Instructions:  Allergies as of 03/11/2016      Reactions   Dilaudid [hydromorphone] Swelling   Codeine Nausea And Vomiting   Latex Swelling   gloves used at dental office caused lips to swell. Used different ones no problem. Never had any problem at hospital or anywhere else.   Lidocaine    "makes my heart race"   Sulfa Drugs Cross Reactors Other (See Comments)   Unknown childhood reaction      Medication List    TAKE these medications   albuterol 108 (90 Base) MCG/ACT inhaler Commonly known as:  PROVENTIL HFA;VENTOLIN HFA Inhale 1-2  puffs into the lungs every 6 (six) hours as needed for wheezing or shortness of breath.   aspirin EC 81 MG tablet Take 81 mg by mouth daily at 12 noon.   atorvastatin 40 MG tablet Commonly known as:  LIPITOR Take 40 mg by mouth daily after supper.   clopidogrel 75 MG tablet Commonly known as:  PLAVIX Take 0.5 tablets (37.5 mg total) by mouth daily. What changed:  when to take this   lisinopril-hydrochlorothiazide 20-12.5 MG tablet Commonly known as:  PRINZIDE,ZESTORETIC Take 1 tablet by mouth daily. What changed:  when to take this   TOPROL XL 50 MG 24 hr tablet Generic drug:  metoprolol succinate Take 25 mg by mouth twice a day with or immediately following a meal. What changed:  how much to take  how to take this  when to take this  additional instructions   traMADol 50 MG tablet Commonly known as:  ULTRAM Take 1-2 tablets (50-100 mg total) by mouth every 12 (twelve) hours as needed for moderate pain.       Activity: No sports or heavy lifting for one month Diet: low fat, low cholesterol diet Wound Care: none needed  Follow-up:  [facility]  in 10 days.  Signed: Edsel Lawson. Jeanne Lawson, M.D., FACS General and minimally invasive surgery Breast and Colorectal Surgery  03/11/2016, 8:54 AM

## 2016-03-11 NOTE — Progress Notes (Signed)
Verbally understood DC instructions, no questions asked. 

## 2016-04-11 ENCOUNTER — Telehealth (HOSPITAL_COMMUNITY): Payer: Self-pay

## 2016-04-11 NOTE — Telephone Encounter (Signed)
Called to schedule 6 month f/u mri, left message for pt to return call. AW 

## 2016-04-25 ENCOUNTER — Other Ambulatory Visit (HOSPITAL_COMMUNITY): Payer: Self-pay | Admitting: Interventional Radiology

## 2016-04-25 DIAGNOSIS — I729 Aneurysm of unspecified site: Secondary | ICD-10-CM

## 2016-05-15 ENCOUNTER — Ambulatory Visit (HOSPITAL_COMMUNITY): Payer: Medicare Other

## 2016-05-19 ENCOUNTER — Telehealth: Payer: Self-pay

## 2016-05-19 NOTE — Telephone Encounter (Signed)
Prior auth obtained for brand Toprol XL 50mg   Through [employer]. [place]- [id number]. Local pharmacy notified.

## 2016-06-01 ENCOUNTER — Encounter (HOSPITAL_COMMUNITY): Admission: RE | Payer: Self-pay | Source: Ambulatory Visit

## 2016-06-01 ENCOUNTER — Ambulatory Visit (HOSPITAL_COMMUNITY)
Admission: RE | Admit: 2016-06-01 | Payer: Medicare Other | Source: Ambulatory Visit | Admitting: Interventional Radiology

## 2016-06-01 ENCOUNTER — Other Ambulatory Visit (HOSPITAL_COMMUNITY): Payer: Medicare Other

## 2016-06-01 ENCOUNTER — Ambulatory Visit (HOSPITAL_COMMUNITY): Payer: Medicare Other

## 2016-06-01 SURGERY — RADIOLOGY WITH ANESTHESIA
Anesthesia: General

## 2016-06-05 ENCOUNTER — Ambulatory Visit (HOSPITAL_COMMUNITY)
Admission: RE | Admit: 2016-06-05 | Discharge: 2016-06-05 | Disposition: A | Discharge status: Home / Self Care | Payer: Medicare Other | Source: Ambulatory Visit | Attending: Interventional Radiology | Admitting: Interventional Radiology

## 2016-06-05 ENCOUNTER — Encounter (HOSPITAL_COMMUNITY): Payer: Self-pay

## 2016-06-05 ENCOUNTER — Ambulatory Visit (HOSPITAL_COMMUNITY): Payer: Medicare Other

## 2016-06-05 DIAGNOSIS — I6612 Occlusion and stenosis of left anterior cerebral artery: Secondary | ICD-10-CM | POA: Insufficient documentation

## 2016-06-05 DIAGNOSIS — I729 Aneurysm of unspecified site: Secondary | ICD-10-CM | POA: Diagnosis present

## 2016-06-05 DIAGNOSIS — I720 Aneurysm of carotid artery: Secondary | ICD-10-CM | POA: Diagnosis not present

## 2016-06-05 LAB — POCT I-STAT CREATININE: Creatinine, Ser: 0.8 mg/dL (ref 0.44–1.00)

## 2016-06-05 MED ORDER — IOPAMIDOL (ISOVUE-370) INJECTION 76%
INTRAVENOUS | Status: AC
Start: 2016-06-05 — End: 2016-06-05
  Administered 2016-06-05: 50 mL via INTRAVENOUS
  Filled 2016-06-05: qty 50

## 2016-06-15 ENCOUNTER — Other Ambulatory Visit (HOSPITAL_COMMUNITY): Payer: Self-pay | Admitting: Interventional Radiology

## 2016-06-15 DIAGNOSIS — I729 Aneurysm of unspecified site: Secondary | ICD-10-CM

## 2016-06-26 ENCOUNTER — Ambulatory Visit (HOSPITAL_COMMUNITY)
Admission: RE | Admit: 2016-06-26 | Discharge: 2016-06-26 | Disposition: A | Discharge status: Home / Self Care | Payer: Medicare Other | Source: Ambulatory Visit | Attending: Interventional Radiology | Admitting: Interventional Radiology

## 2016-06-26 DIAGNOSIS — I729 Aneurysm of unspecified site: Secondary | ICD-10-CM

## 2016-06-26 HISTORY — PX: IR RADIOLOGIST EVAL & MGMT: IMG5224

## 2016-06-27 ENCOUNTER — Encounter (HOSPITAL_COMMUNITY): Payer: Self-pay | Admitting: Interventional Radiology

## 2016-06-30 ENCOUNTER — Encounter: Payer: Self-pay | Admitting: Cardiology

## 2016-06-30 ENCOUNTER — Ambulatory Visit (INDEPENDENT_AMBULATORY_CARE_PROVIDER_SITE_OTHER): Payer: Medicare Other | Admitting: Cardiology

## 2016-06-30 VITALS — BP 120/60 | HR 58 | Ht 61.0 in | Wt 160.0 lb

## 2016-06-30 DIAGNOSIS — I10 Essential (primary) hypertension: Secondary | ICD-10-CM

## 2016-06-30 DIAGNOSIS — I639 Cerebral infarction, unspecified: Secondary | ICD-10-CM

## 2016-06-30 DIAGNOSIS — I119 Hypertensive heart disease without heart failure: Secondary | ICD-10-CM

## 2016-06-30 DIAGNOSIS — I2510 Atherosclerotic heart disease of native coronary artery without angina pectoris: Secondary | ICD-10-CM | POA: Diagnosis not present

## 2016-06-30 DIAGNOSIS — E782 Mixed hyperlipidemia: Secondary | ICD-10-CM | POA: Diagnosis not present

## 2016-06-30 NOTE — Patient Instructions (Signed)
Medication Instructions:   STOP TAKING PLAVIX NOW  CONTINUE TAKING YOUR ASPIRIN 81 MG ONCE DAILY    Follow-Up:  Your physician wants you to follow-up in: [place] will receive a reminder letter in the mail two months in advance. If you don't receive a letter, please call our office to schedule the follow-up appointment.        If you need a refill on your cardiac medications before your next appointment, please call your pharmacy.

## 2016-06-30 NOTE — Progress Notes (Signed)
Cardiology Office Note    Date:  06/30/2016   ID:  Jeanne Lawson, Jeanne Lawson [birth year], MRN [id number]  PCP:  [name], MD  Cardiologist:   Ena Dawley, MD   Chief complain: 6 months follow-up, new findings of intracranial bleeding  History of Present Illness:  Jeanne Lawson is a 71 y.o. female previously followed by Dr. Mare Ferrari.  She has a past history of multiple strokes. She's had problems with high blood pressure in the past. She has a history of prior coronary artery disease and 16 years ago Dr. Ilda Foil did cardiac catheterization and angioplasty but no stent. We don't have those records. The patient has had several subsequent Cardiolite stress tests which have been normal but have been poorly tolerated by the patient and she does not want any further nuclear stress tests. She does have a chronically abnormal EKG with anterolateral T wave inversions. She denies any symptoms of angina or dyspnea since her last OV. Unfortunately, she has had recurrent strokes. Her last CVA was in April 2016. She was also found to have enlarging aneurysms of the posterior circulation That was stented by Dr. [name]. This is followed by Dr. [name] and Dr. Oneida Alar.   06/30/2016 - this is her 6 months follow-up, The patient has been stable from cardiac standpoint and remains active with no chest pain or shortness of breath however she stressed out because on her most recent CT head that showed intracranial bleeding, and Dr. [name] wanted her to follow with [place] to see if it would be safe to discontinue Plavix. However I cannot find CT that describes the bleeding to most recent CT angiography of the neck and brain on 06/05/2016  showed:  No change in the appearance of the right supraclinoid ICA/ P com aneurysm with maximal diameter of 8 mm. Persistent flow through the pipeline stent. Interval thrombosis of the left A1 aneurysm and proximal A1 vessel. Left anterior cerebral artery receives it is flow  through a patent anterior communicating artery. Patent pipeline stent. 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  She denies any recent strokelike symptoms, no dizziness lightheadedness no new weakness no falls. No blurry vision. She states that Plavix irritates her stomach as well and she would like to stop taking it.  Past Medical History:  Diagnosis Date  . COPD (chronic obstructive pulmonary disease) ([place])   . Coronary artery disease 1996   Known with prior mild lesion of LAD demonstrated by Cardiac Catheterization in 1996  . Edema of foot    She has a history of chronic edema of the left dated back to age 1 when she suufered severe frostbite playing  on the snow as a child  . Hypertension   . PAC (premature atrial contraction)   . Pancreatitis    x2  . PONV (postoperative nausea and vomiting)    problems waking up last time 4/16 only time  . 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  Marland Kitchen Shortness of breath dyspnea    since stroke 2 months ago -  . Stroke [facility]) 04/2014   She had had a previous thrombotic stroke  involving the right corona radiata in October 2010; left arm and leg weakness  . TIA (transient ischemic attack)    She was hospitalized 04-23-09 through 04-27-09 for involving right  side of the body  . Tobacco abuse    Ongoing   . Ventricular hypertrophy 04/2009   LVH with diastolic dysfunction by echo. Has normal EF.    Past Surgical History:  Procedure Laterality Date  . ABDOMINAL HYSTERECTOMY    . APPENDECTOMY    . CARDIAC CATHETERIZATION  1996   Mild CAD with vasospasm  . CARDIOVASCULAR STRESS TEST  12-02-2001   EF 70%  . CHOLECYSTECTOMY N/A 03/09/2016   Procedure: LAPAROSCOPIC CHOLECYSTECTOMY WITH INTRAOPERATIVE CHOLANGIOGRAM;  Surgeon: Georganna Skeans, MD;  Location: [place];  Service: General;  Laterality:  N/A;  . ENDARTERECTOMY Right 08/12/2014   Procedure: RIGHT  COMMON CAROTID ARTERY EXPOSURE FOR INTERVENTIONAL RADIOLOGY PROCEDURE BY DR.DEVASHWAR,Insertion 6 FR sheath;  Surgeon: Elam Dutch, MD;  Location: [place];  Service: Vascular;  Laterality: Right;  . ENDARTERECTOMY Right 08/12/2014   Procedure:  CAROTID  EXPOSURE CLOSURE RIGHT NECK, REPAIR RIGHT COMMON CAROTID ARTERY;  Surgeon: Elam Dutch, MD;  Location: [place];  Service: Vascular;  Laterality: Right;  . ENDARTERECTOMY Left 11/18/2014   Procedure: CAROTID EXPOSURE;  Surgeon: Elam Dutch, MD;  Location: [place];  Service: Vascular;  Laterality: Left;  . ENDARTERECTOMY Left 11/18/2014   Procedure: CLOSURE CAROTID;  Surgeon: Elam Dutch, MD;  Location: [place];  Service: Vascular;  Laterality: Left;  . IR RADIOLOGIST EVAL & MGMT  06/26/2016  . LAPAROSCOPY    . NECK SURGERY  50 yrs ago   Left side tumor  . RADIOLOGY WITH ANESTHESIA N/A 05/25/2014   Procedure: RADIOLOGY WITH ANESTHESIA;  Surgeon: [name], MD;  Location: [place];  Service: Radiology;  Laterality: N/A;  . RADIOLOGY WITH ANESTHESIA N/A 08/12/2014   Procedure: RADIOLOGY WITH ANESTHESIA;  Surgeon: [name], MD;  Location: [place];  Service: Radiology;  Laterality: N/A;  . RADIOLOGY WITH ANESTHESIA N/A 03/15/2015   Procedure: MRI OF BRAIN WITHOUT CONTRAST;  Surgeon: Medication Radiologist, MD;  Location: [place];  Service: Radiology;  Laterality: N/A;  DR. TAT/MRI  . US ECHOCARDIOGRAPHY  04-26-2009   EF 65-70%  . VESICOVAGINAL FISTULA CLOSURE W/ TAH  25 yrs ago    Current Medications: Outpatient Medications Prior to Visit  Medication Sig Dispense Refill  . albuterol (PROVENTIL HFA;VENTOLIN HFA) 108 (90 BASE) MCG/ACT inhaler Inhale 1-2 puffs into the lungs every 6 (six) hours as needed for wheezing or shortness of breath.    Marland Kitchen aspirin EC 81 MG tablet Take 81 mg by mouth daily at 12 noon.    Marland Kitchen atorvastatin (LIPITOR) 40 MG tablet Take 40 mg by mouth daily  after supper.  11  . lisinopril-hydrochlorothiazide (PRINZIDE,ZESTORETIC) 20-12.5 MG tablet Take 1 tablet by mouth daily. (Patient taking differently: Take 1 tablet by mouth daily after lunch. ) 30 tablet 11  . TOPROL XL 50 MG 24 hr tablet Take 25 mg by mouth twice a day with or immediately following a meal. 60 tablet 11  . clopidogrel (PLAVIX) 75 MG tablet Take 0.5 tablets (37.5 mg total) by mouth daily. (Patient taking differently: Take 37.5 mg by mouth every other day. )    . traMADol (ULTRAM) 50 MG tablet Take 1-2 tablets (50-100 mg total) by mouth every 12 (twelve) hours as needed for moderate pain. (Patient not taking: Reported on 06/30/2016) 30 tablet 0   No facility-administered medications prior to visit.      Allergies:   Dilaudid [hydromorphone]; Codeine; Latex; Lidocaine; and Sulfa drugs cross reactors   Social History   Social History  .  Marital status: Widowed    Spouse name: N/A  . Number of children: N/A  . Years of education: N/A   Social History Main Topics  . Smoking status: Former Smoker    Packs/day: 1.00    Years: 30.00    Types: Cigarettes    Quit date: 04/26/2014  . Smokeless tobacco: Never Used  . Alcohol use No  . Drug use: No  . Sexual activity: Not Asked   Other Topics Concern  . None   Social History Narrative  . None     Family History:  The patient's family history includes Aneurysm in her sister; Diabetes in her father; Emphysema in her father; Heart disease in her mother; Hypertension in her daughter and mother; Stroke in her mother; Thyroid disease in her daughter.   ROS:   Please see the history of present illness.    ROS All other systems reviewed and are negative.   PHYSICAL EXAM:   VS:  BP 120/60   Pulse (!) 58   Ht 5\' 1"  (1.549 m)   Wt 160 lb (72.6 kg) Comment: 155lb at home  BMI 30.23 kg/m    GEN: Well nourished, well developed, in no acute distress  HEENT: normal  Neck: no JVD, carotid bruits, or masses Cardiac: RRR; no  murmurs, rubs, or gallops,no edema  Respiratory:  clear to auscultation bilaterally, normal work of breathing GI: soft, nontender, nondistended, + BS MS: no deformity or atrophy  Skin: warm and dry, no rash Neuro:  Alert and Oriented x 3, Strength and sensation are intact Psych: euthymic mood, full affect  Wt Readings from Last 3 Encounters:  06/30/16 160 lb (72.6 kg)  03/06/16 156 lb (70.8 kg)  11/29/15 166 lb (75.3 kg)    Studies/Labs Reviewed:   EKG:  EKG is ordered today.  The ekg ordered today demonstrates Sinus bradycardia, possible prior anterior infarct age undetermined, no change from prior.  Recent Labs: 03/08/2016: TSH 2.332 03/09/2016: ALT 10; BUN 9; Hemoglobin 9.1; Platelets 107; Potassium 3.5; Sodium 137 06/05/2016: Creatinine, Ser 0.80   Lipid Panel    Component Value Date/Time   CHOL 119 03/08/2016 0447   TRIG 68 03/08/2016 0447   HDL 58 03/08/2016 0447   CHOLHDL 2.1 03/08/2016 0447   VLDL 14 03/08/2016 0447   LDLCALC 47 03/08/2016 0447   02/2015  - Left ventricle: The cavity size was normal. Wall thickness was   increased in a pattern of moderate LVH. Systolic function was   normal. The estimated ejection fraction was in the range of 60%   to 65%. Wall motion was normal; there were no regional wall   motion abnormalities. Doppler parameters are consistent with   abnormal left ventricular relaxation (grade 1 diastolic   dysfunction).    ASSESSMENT:    1. Acute CVA (cerebrovascular accident) ([place])   2. Coronary artery disease involving native coronary artery of native heart without angina pectoris   3. Mixed hyperlipidemia   4. Hypertensive heart disease without CHF   5. Essential hypertension      PLAN:  In order of problems listed above:  The patient is stable from cardiac standpoint she has no symptoms suspicious for worsening coronary artery disease. Her blood pressure and heart rate are well controlled with metoprolol and lisinopril. She has  been on moderate dose of atorvastatin that she tolerates well. We will forward our note to Dr.Deveshwar who follows her for her brain aneurysms is I cannot find imaging that would support intracranial  bleed. However it's perfectly fine from cardiac standpoint to stop Plavix and continue aspirin only. She was started on Plavix by neurologist after she had her ischemic strokes. They should decide if they would prefer Plavix over aspirin. Another good reason to stop Plavix is that she is intolerant to it and has significant stomach pain. However from cardiac standpoint either aspirin 81 mg daily or Plavix 75 mg daily would be acceptable.   Medication Adjustments/Labs and Tests Ordered: Current medicines are reviewed at length with the patient today.  Concerns regarding medicines are outlined above.  Medication changes, Labs and Tests ordered today are listed in the Patient Instructions below. Patient Instructions  Medication Instructions:   STOP TAKING PLAVIX NOW  CONTINUE TAKING YOUR ASPIRIN 81 MG ONCE DAILY    Follow-Up:  Your physician wants you to follow-up in: [place] will receive a reminder letter in the mail two months in advance. If you don't receive a letter, please call our office to schedule the follow-up appointment.        If you need a refill on your cardiac medications before your next appointment, please call your pharmacy.      Signed, Ena Dawley, MD  06/30/2016 2:41 PM    [address], [place], [place]  [address] Phone: [phone]; Fax: [phone]

## 2016-07-10 ENCOUNTER — Telehealth: Payer: Self-pay | Admitting: Cardiology

## 2016-07-10 NOTE — Telephone Encounter (Signed)
I spoke with pt's daughter who reports pt got new bottle of Toprol 3 days ago.  For the past 3 days she has been having nausea and dizziness that lasts about 45 minutes.  They have checked with pharmacy and Toprol manufacturer has not changed.  I told daughter dizziness and nausea were probably not related to new bottle of Toprol as there had been no change in supplier.   Daughter reports this dizziness /nausea is not at all like previous stroke symptoms. Pt is not having these symptoms at current time. I asked daughter to follow up with primary care regarding nausea and dizziness. She is asking about Plavix that was recently stopped.  I told her Dr. Meda Coffee said it was OK from a cardiac standpoint to stop but I asked her to follow up with neurology to see if they felt she should be on Plavix. Pt has not taken ASA today and I told daughter pt should take this today.  Pt has not taken BP medicine yet today.  Daughter will check pt's BP and as long as it is OK will have pt take her medication.

## 2016-07-10 NOTE — Telephone Encounter (Signed)
Pt having dizziness and nausea for about 45 min to an hour , feels some better, hasn't taken BP med or ASA yest today-taking Toprol [phone]

## 2016-07-10 NOTE — Telephone Encounter (Signed)
She had recent CVA, I would verify with neurology before stopping Plavix, if her symptoms are similar to her stroke I would contact neurology or go to the ER if symptoms not improving or getting worse.

## 2016-07-10 NOTE — Telephone Encounter (Signed)
Left message to call office

## 2016-07-11 NOTE — Telephone Encounter (Signed)
Dr Meda Coffee you already stopped Plavix.  You stopped this at your last OV with the pt.  Please clarify?

## 2016-07-11 NOTE — Telephone Encounter (Signed)
Ok, now I remember, yes stop Plavix, continue aspirin

## 2016-07-11 NOTE — Telephone Encounter (Signed)
Notified the pt that per Dr Meda Coffee, she should continue with stopping plavix and continue ASA.  Pt verbalized understanding and agrees with this plan.  Pt gracious for all the assistance provided.

## 2016-07-20 ENCOUNTER — Telehealth (HOSPITAL_COMMUNITY): Payer: Self-pay | Admitting: *Deleted

## 2016-07-20 NOTE — Telephone Encounter (Signed)
Called patient she is to stop her Plavix.  She stated that he stopped it 6 weeks ago.

## 2016-08-07 NOTE — Anesthesia Postprocedure Evaluation (Deleted)
Anesthesia Post Note  Patient: Jeanne Lawson  Procedure(s) Performed: Procedure(s) (LRB): LAPAROSCOPIC CHOLECYSTECTOMY WITH INTRAOPERATIVE CHOLANGIOGRAM (N/A)     Anesthesia Post Evaluation  Last Vitals:  Vitals:   03/10/16 2102 03/11/16 0623  BP: (!) 152/70 (!) 150/79  Pulse: 81 79  Resp: 17 18  Temp: 37.1 C 36.9 C    Last Pain:  Vitals:   03/11/16 0623  TempSrc: Oral  PainSc:                  Romolo Sieling,E. Kobyn Kray

## 2016-08-07 NOTE — Addendum Note (Signed)
Addendum  created 08/07/16 1630 by Annye Asa, MD   Delete clinical note, Sign clinical note

## 2016-08-07 NOTE — Addendum Note (Signed)
Addendum  created 08/07/16 1531 by Annye Asa, MD   Sign clinical note

## 2016-08-16 ENCOUNTER — Other Ambulatory Visit: Payer: Self-pay | Admitting: Pharmacist

## 2016-08-16 DIAGNOSIS — E782 Mixed hyperlipidemia: Secondary | ICD-10-CM

## 2016-08-16 DIAGNOSIS — I119 Hypertensive heart disease without heart failure: Secondary | ICD-10-CM

## 2016-08-16 DIAGNOSIS — I2510 Atherosclerotic heart disease of native coronary artery without angina pectoris: Secondary | ICD-10-CM

## 2016-08-16 MED ORDER — METOPROLOL SUCCINATE ER 50 MG PO TB24: ORAL_TABLET | ORAL | 11 refills | Status: DC

## 2016-08-16 NOTE — Progress Notes (Signed)
Received notice from [facility] that changing Toprol to generic metoprolol succinate would save pt over $300 per year. Called pt to discuss switching to generic and she is fine with this. Rx sent in.

## 2016-09-15 IMAGING — MR MR HEAD W/O CM
12 of 17 series · 22 of 48 positions shown · IV contrast (Yes)
Comparison: 05/02/2014 head CT. 04/24/2009 brain MR and MR
angiogram.

CLINICAL DATA: 68-year-old hypertensive female with history of
atrial fibrillation and known cerebral aneurysms presenting with
abnormal speech and transient facial droop. Initial encounter.

EXAM:
MR HEAD WITHOUT CONTRAST
MR CIRCLE OF WILLIS WITHOUT CONTRAST
MRA OF THE NECK WITHOUT AND WITH CONTRAST
TECHNIQUE: Multiplanar, multiecho pulse sequences of the brain and surrounding
structures were obtained according to standard protocol without
intravenous contrast.; Angiographic images of the Circle of Willis
were obtained using MRA technique without intravenous contrast.;
Multiplanar and multiecho pulse sequences of the neck were obtained
without and with intravenous contrast. Angiographic images of the
neck were obtained using MRA technique without and with intravenous
contast.
CONTRAST:  13mL MULTIHANCE GADOBENATE DIMEGLUMINE 529 MG/ML IV SOLN

[Series 3: DWI · axial · 3.0mm · 0.94mm/px · z∈[-20,+118]mm · 3 of 94 slices shown (1 of 4)]
[im 1/94]
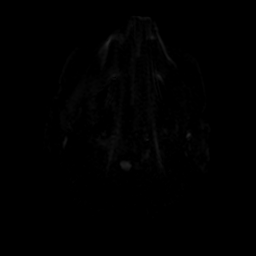
[im 47/94]
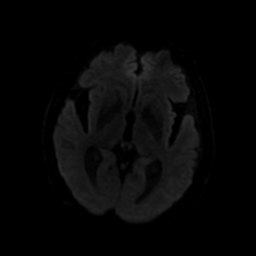
[im 94/94]
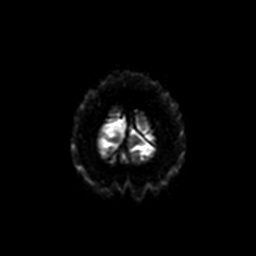

[Series 4: ax (id) 2 · axial · 1.4mm · 0.43mm/px · z∈[-31,+60]mm · 5 of 132 slices shown]
[im 1/132]
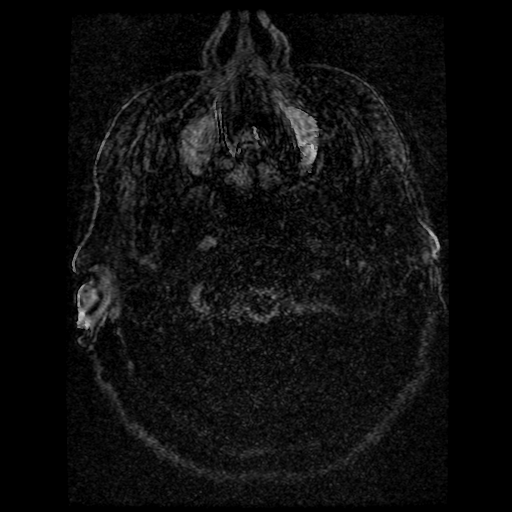
[im 33/132]
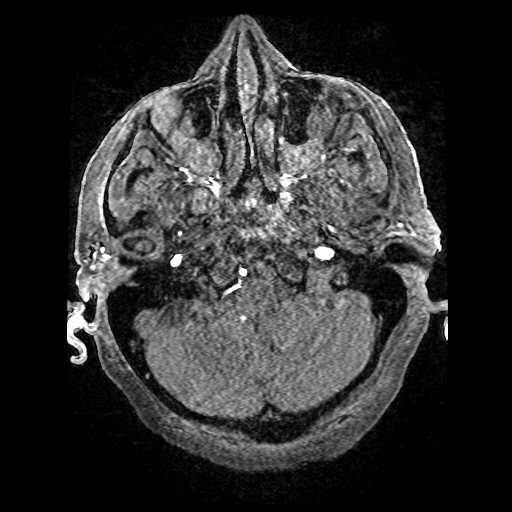
[im 66/132]
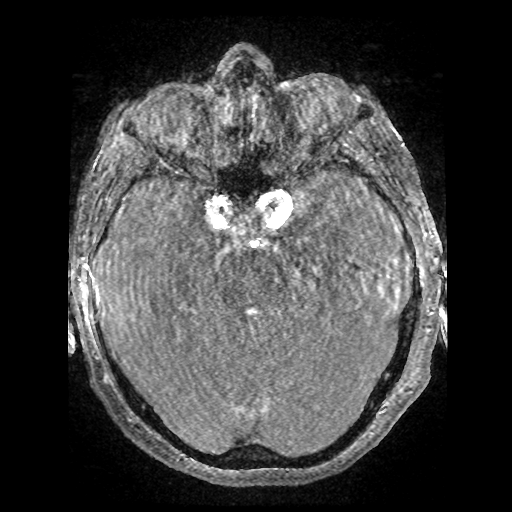
[im 99/132]
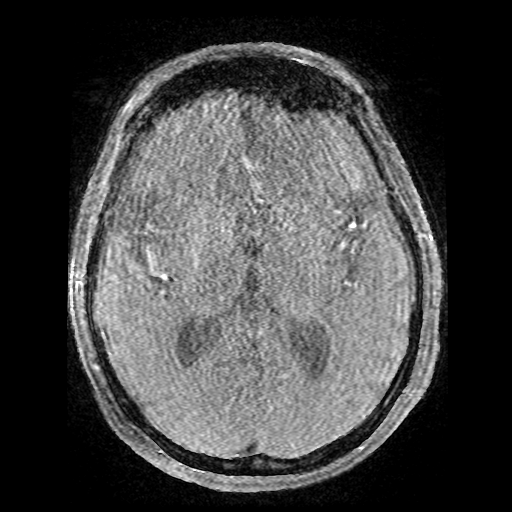
[im 132/132]
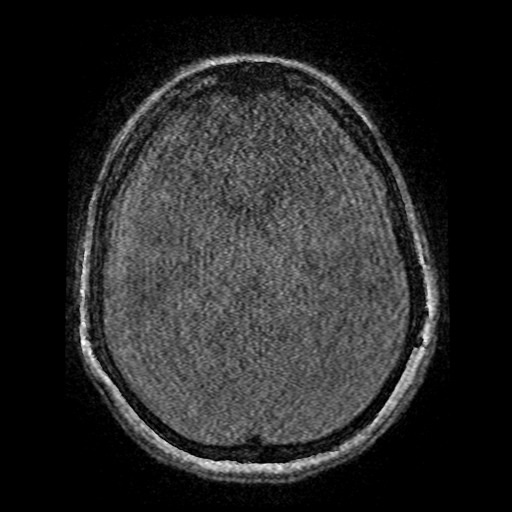

[Series 5: T2 · axial · 5.0mm · 0.47mm/px · 1 of 23 slices shown (1 of 3)]
[im 1/23]
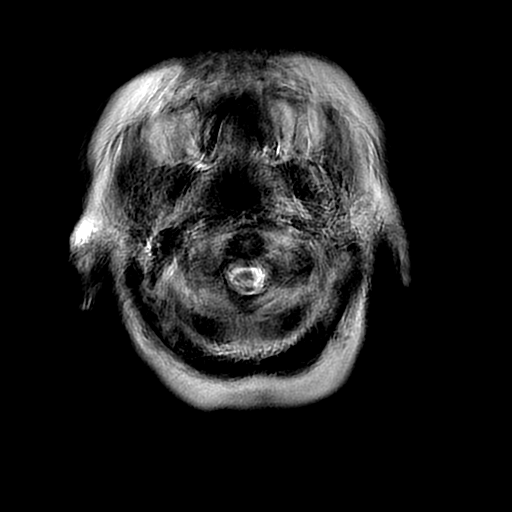

[Series 6: T2 · axial · 5.0mm · 0.47mm/px · 1 of 23 slices shown (2 of 3)]
[im 1/23]
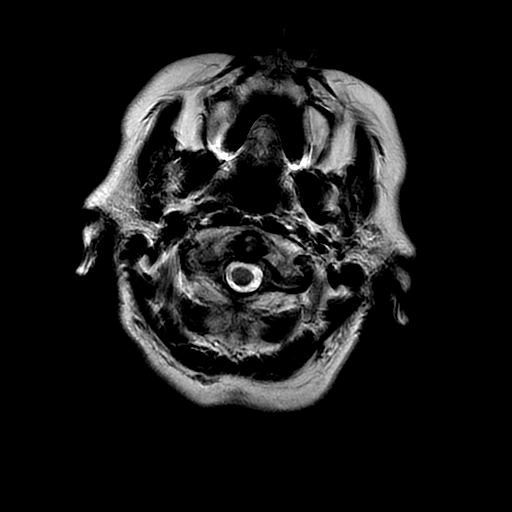

[Series 7: FLAIR · axial · 5.0mm · 0.47mm/px · 1 of 23 slices shown (1 of 2)]
[im 1/23]
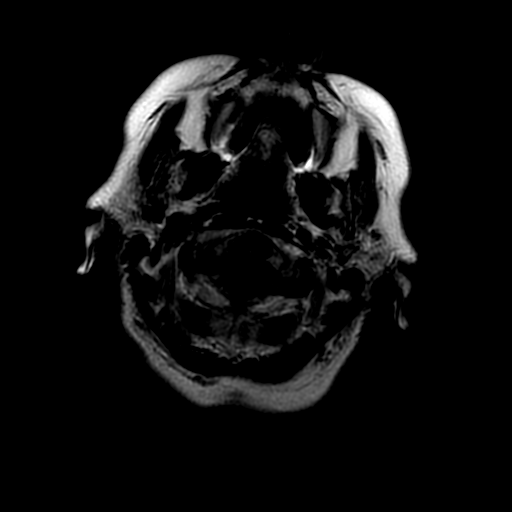

[Series 8: ax (id) fast · axial · 2.8mm · 0.43mm/px · z∈[-32,-11]mm · 2 of 152 slices shown]
[im 1/152]
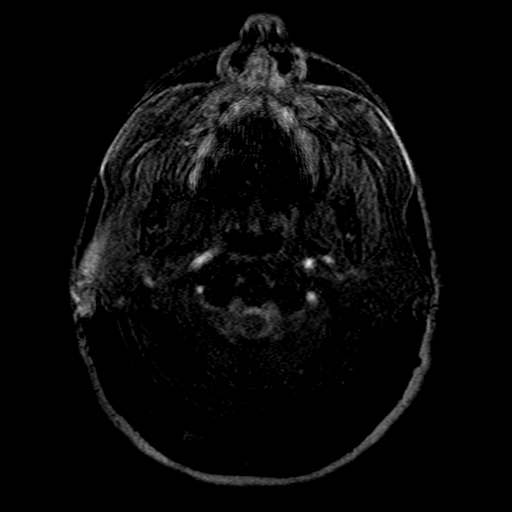
[im 31/152]
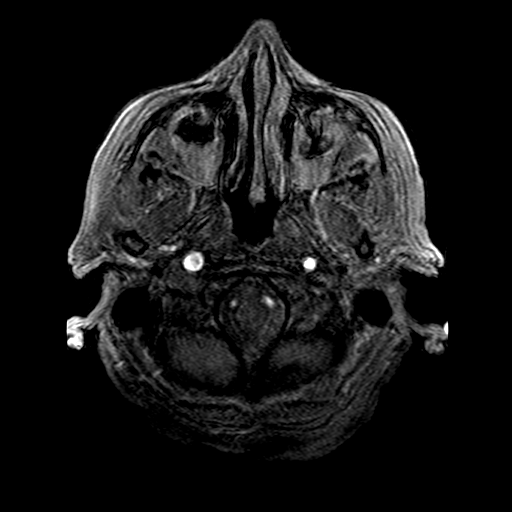

[Series 9: DWI · coronal · 5.0mm · 0.94mm/px · 3 of 68 slices shown (2 of 4)]
[im 1/68]
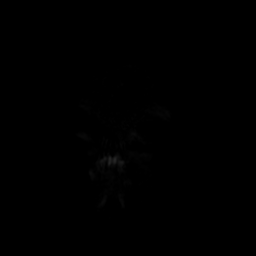
[im 34/68]
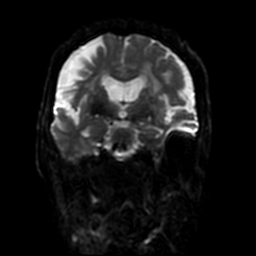
[im 68/68]
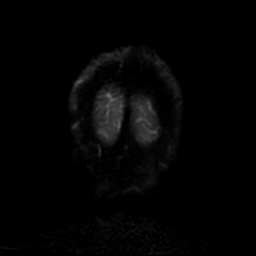

[Series 10: GRE · axial · 5.0mm · 0.47mm/px · 1 of 23 slices shown]
[im 1/23]
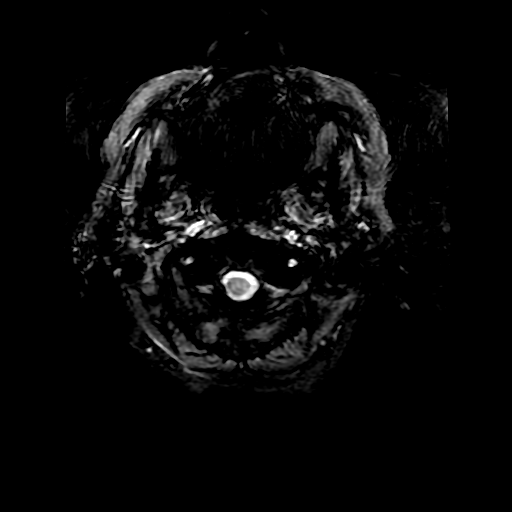

[Series 11: FLAIR · axial · 5.0mm · 0.47mm/px · 1 of 23 slices shown (2 of 2)]
[im 1/23]
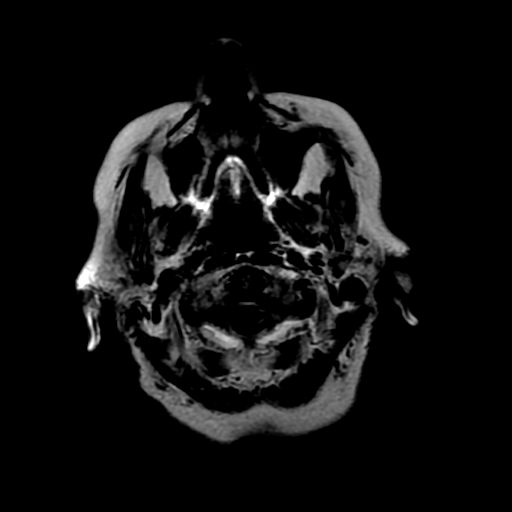

[Series 12: T2 · coronal · 5.0mm · 0.47mm/px · 1 of 28 slices shown (3 of 3)]
[im 1/28]
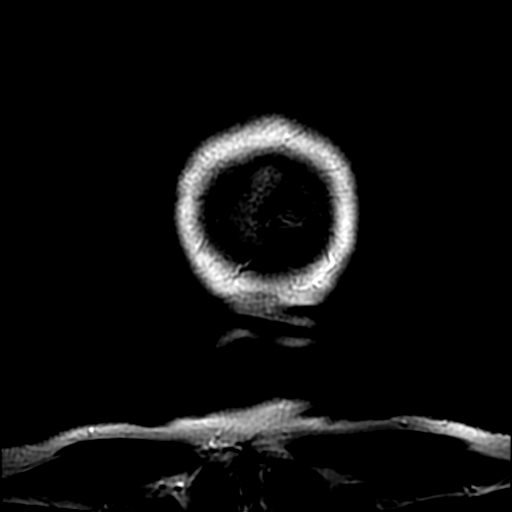

[Series 300: DWI · axial · 3.0mm · 0.94mm/px · z∈[-20,+118]mm · 2 of 47 slices shown (3 of 4)]
[im 1/47]
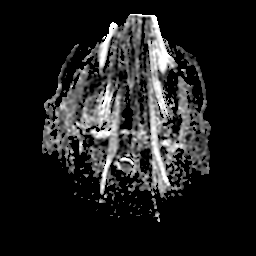
[im 47/47]
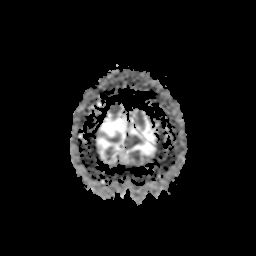

[Series 900: DWI · coronal · 5.0mm · 0.94mm/px · 1 of 34 slices shown (4 of 4)]
[im 1/34]
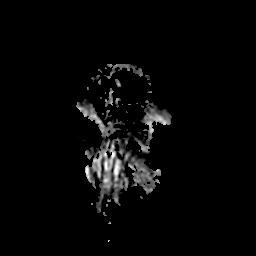

[22 of 48 positions shown; findings below may reference images not displayed]

FINDINGS: MR HEAD FINDINGS

Exam is motion degraded. Sagittal T1 weighted imaging not performed.

Acute nonhemorrhagic small infarct at the junction of the posterior
superior aspect of the left lenticular nucleus and the corona
radiata.

Remote bilateral centrum semiovale/ corona radiata infarcts. Remote
bilateral thalamic and basal ganglia small infarcts. Prominent small
vessel disease type changes.

No intracranial hemorrhage.

Global atrophy without hydrocephalus.

No intracranial mass lesion noted on this unenhanced exam.

Aneurysms as discussed below.

MR CIRCLE OF WILLIS FINDINGS

Exam is motion degraded.

Aneurysm left carotid terminus projecting superiorly and posteriorly
has increased in size now measuring 8 x 7 x 6 mm versus prior 6 x 5
x 5 mm.

Aneurysm arising from the right posterior communicating artery
region has increased in size now measuring 9 x 7 x 6 mm versus prior
6 x 5 x 6 mm

Fusiform ectasia of the right internal carotid artery distal
vertical segment similar to prior exam.

Moderate narrowing of portions of the A1 segment of the right
anterior cerebral artery.

Middle cerebral artery moderate branch vessel narrowing and
irregularity.

A2 segment anterior cerebral artery moderate narrowing and
irregularity.

Right vertebral artery ends in a posterior inferior cerebellar
artery distribution. Mild to moderate narrowing of portions of the
distal right vertebral artery.

Slight irregularity with mild narrowing distal left vertebral artery
and basilar artery.

Nonvisualized left posterior inferior cerebellar artery.

Nonvisualized right anterior inferior cerebellar artery.

Fetal type contribution to the right posterior cerebral artery.

Moderate narrowing P1 -2 junction left posterior cerebral artery.

MRA NECK FINDINGS

Evaluation of aortic arch and great vessels limited by motion. Two
vessel aortic arch.

Ectatic common carotid arteries.

No significant stenosis of either carotid bifurcation.

Ectatic vertical cervical segment of the internal carotid artery
bilaterally.

Limited evaluation of the origin of the vertebral arteries. Left
vertebral artery is dominant. Areas of narrowing, regularity ectasia
of vertebral arteries bilaterally.
IMPRESSION: MR HEAD

Exam is motion degraded. Sagittal T1 weighted imaging not performed.

Acute nonhemorrhagic small infarct at the junction of the posterior
superior aspect of the left lenticular nucleus and the corona
radiata.

Remote bilateral centrum semiovale/ corona radiata infarcts. Remote
bilateral thalamic and basal ganglia small infarcts. Prominent small
vessel disease type changes.

No intracranial hemorrhage.

MR CIRCLE OF WILLIS

Exam is motion degraded.

Aneurysm left carotid terminus projecting superiorly and posteriorly
has increased in size now measuring 8 x 7 x 6 mm versus prior 6 x 5
x 5 mm.

Aneurysm arising from the right posterior communicating artery
region has increased in size now measuring 9 x 7 x 6 mm versus prior
6 x 5 x 6 mm

Fusiform ectasia of the right internal carotid artery distal
vertical segment similar to prior exam.

Moderate narrowing of portions of the A1 segment of the right
anterior cerebral artery.

Middle cerebral artery moderate branch vessel narrowing and
irregularity.

A2 segment anterior cerebral artery moderate narrowing and
irregularity.

Right vertebral artery ends in a posterior inferior cerebellar
artery distribution. Mild to moderate narrowing of portions of the
distal right vertebral artery.

Slight irregularity with mild narrowing distal left vertebral artery
and basilar artery.

Nonvisualized left posterior inferior cerebellar artery.

Nonvisualized right anterior inferior cerebellar artery.

Fetal type contribution to the right posterior cerebral artery.

Moderate narrowing P1 -2 junction left posterior cerebral artery.

MRA NECK

Evaluation of aortic arch and great vessels limited by motion.

Ectatic common carotid arteries.

No significant stenosis of either carotid bifurcation.

Ectatic vertical cervical segment of the internal carotid artery
bilaterally.

Limited evaluation of the origin of the vertebral arteries. Left
vertebral artery is dominant. Areas of narrowing, regularity ectasia
of vertebral arteries bilaterally.

## 2016-09-15 IMAGING — CR DG CHEST 2V
2 series · 2 of 2 positions shown · non-contrast
Comparison: Two-view chest x-ray 04/22/2014.

CLINICAL DATA: Productive cough. Shortness of breath. History
smoking 2-3 cigarettes per day.

EXAM:
CHEST - 2 VIEW

[chest pa]
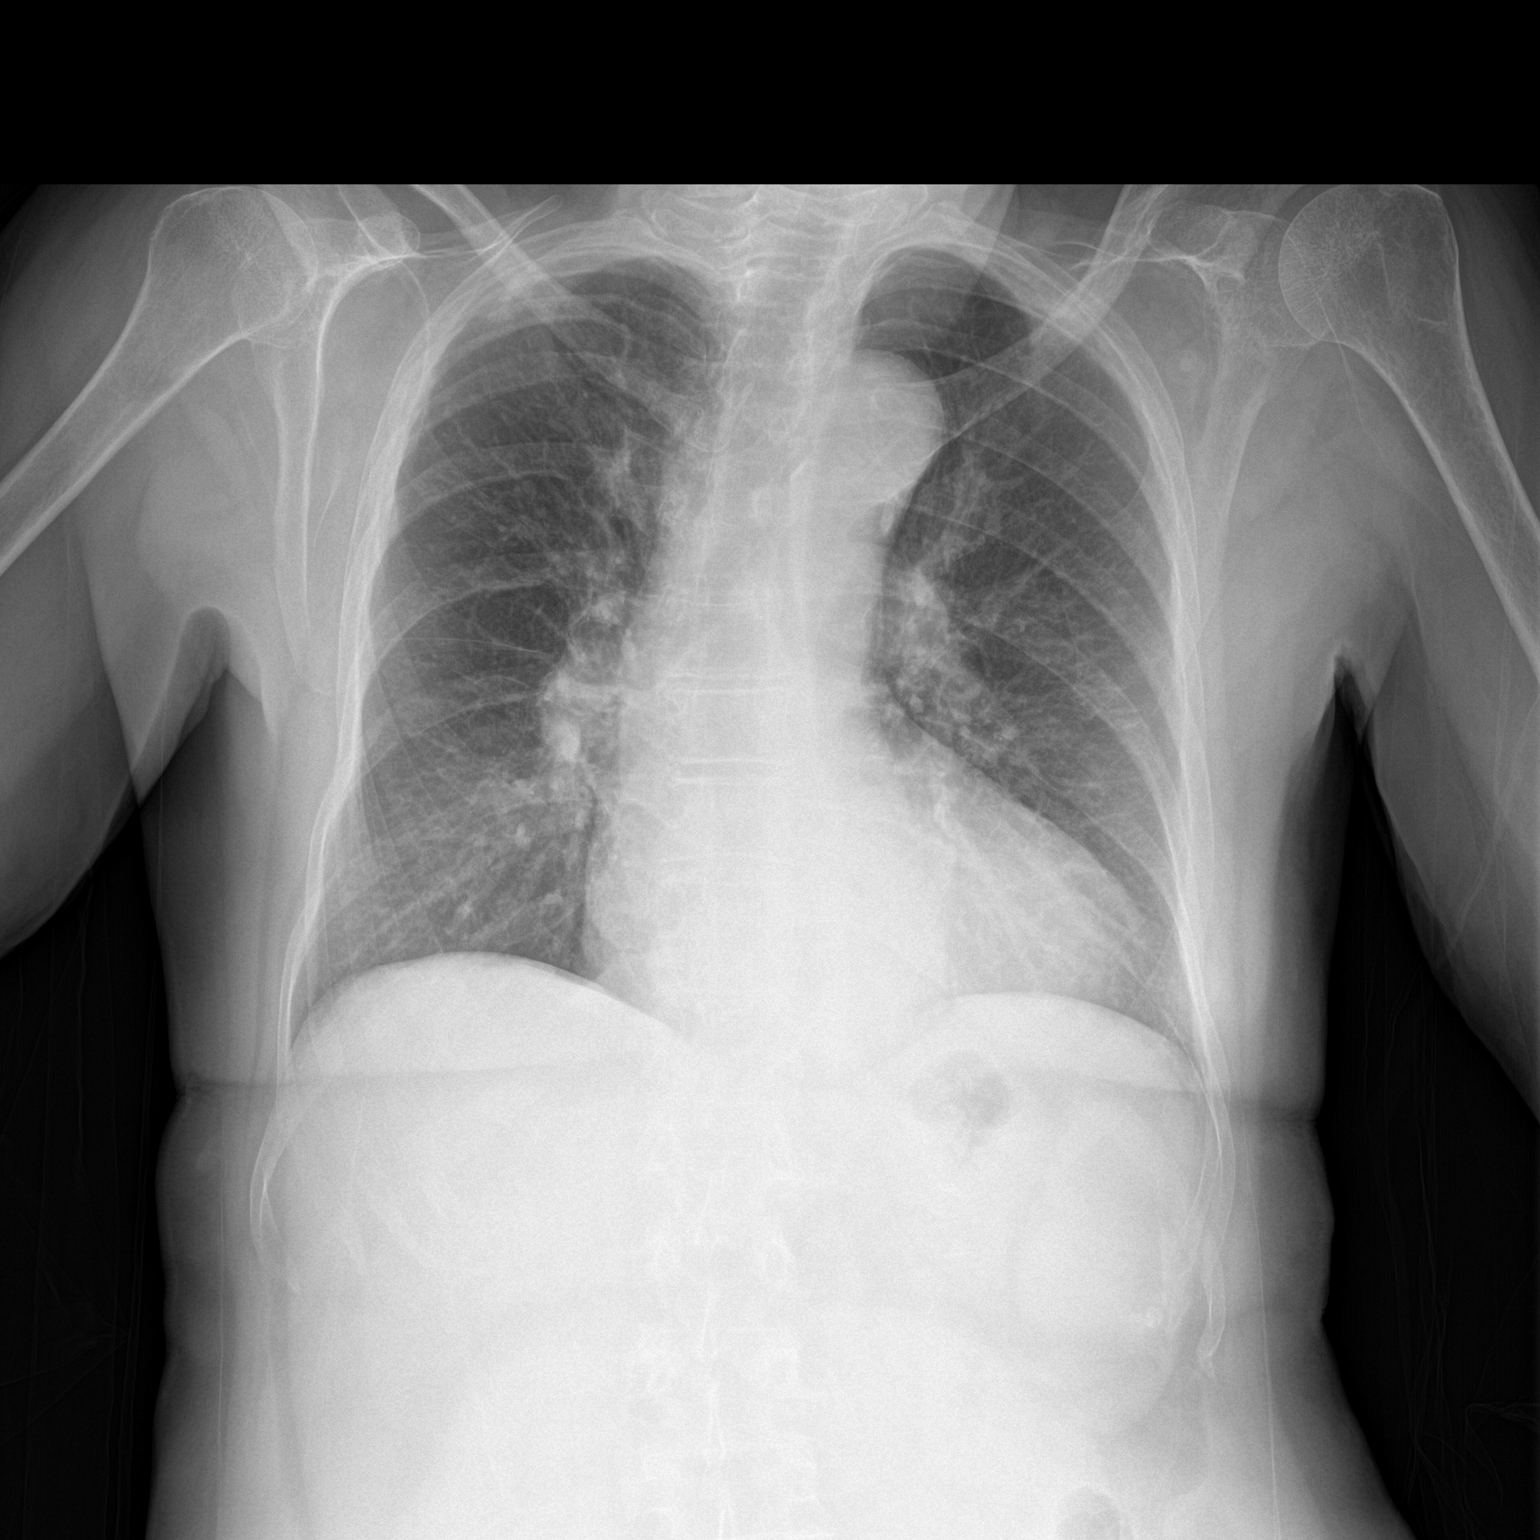

[chest lat]
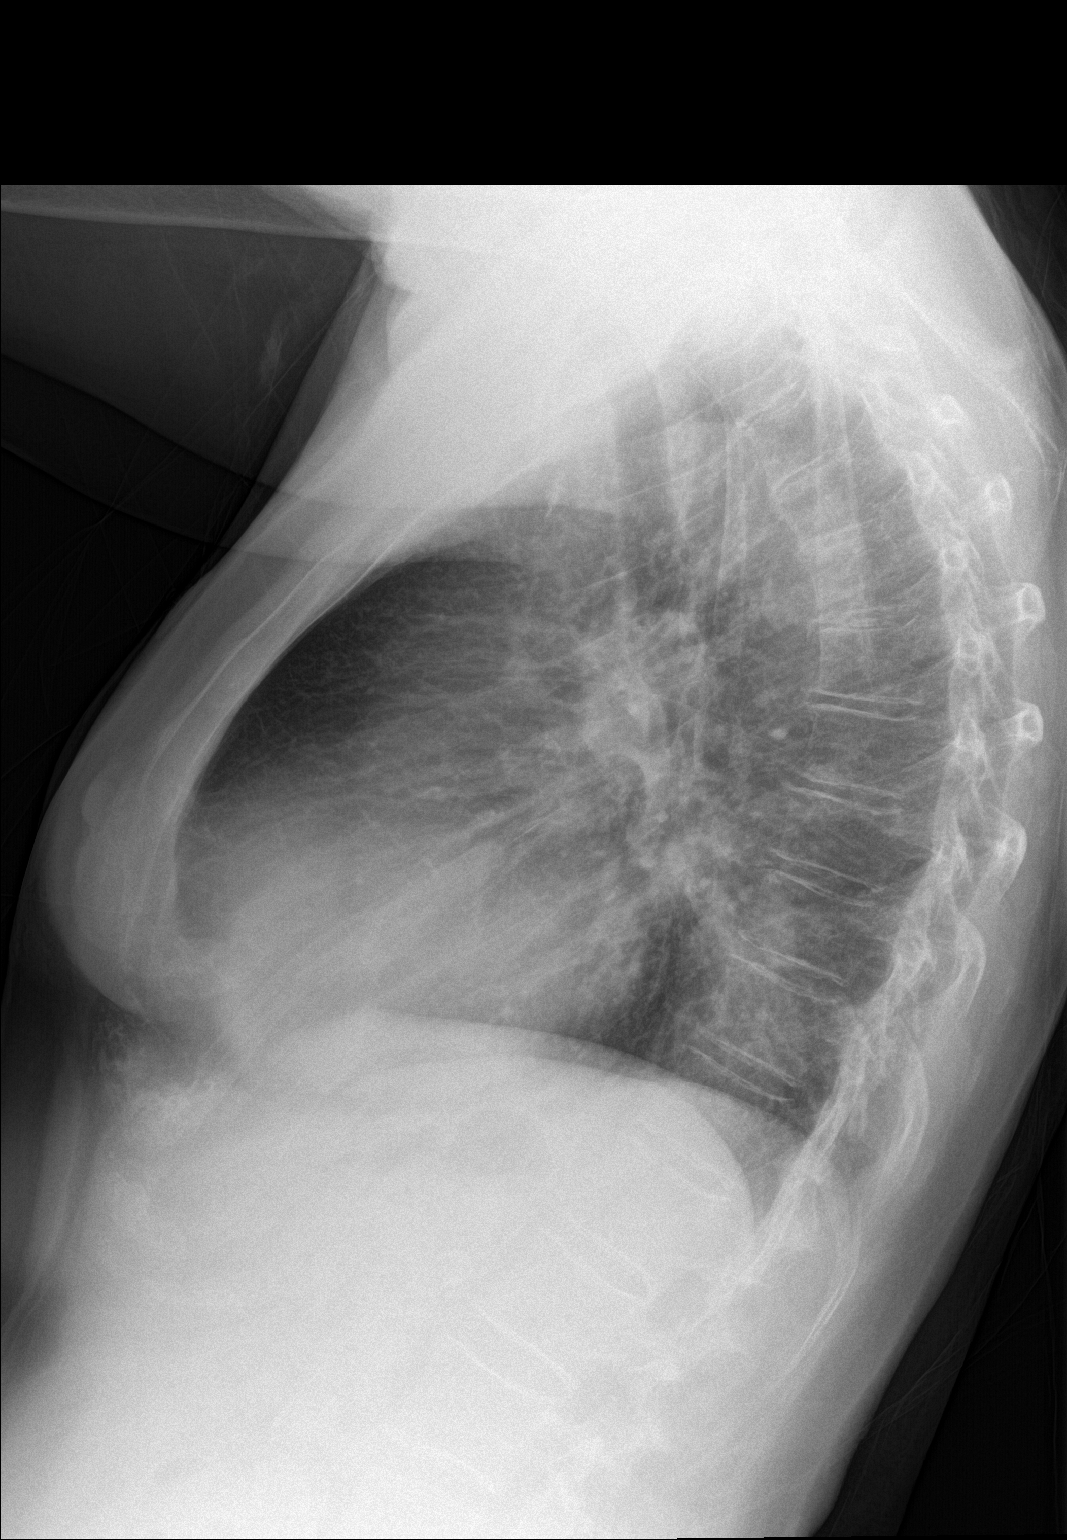

[2 of 2 positions shown; findings below may reference images not displayed]

FINDINGS: Emphysematous changes are again noted. The heart is enlarged. There
is no edema or effusion to suggest failure. No focal airspace
disease is evident. The visualized soft tissues and bony thorax are
unremarkable.
IMPRESSION: 1. Stable cardiomegaly without failure.
2. Emphysema.

## 2016-09-19 ENCOUNTER — Telehealth: Payer: Self-pay | Admitting: Cardiology

## 2016-09-19 MED ORDER — TOPROL XL 50 MG PO TB24: ORAL_TABLET | ORAL | 2 refills | Status: DC

## 2016-09-19 NOTE — Telephone Encounter (Signed)
New message      *STAT* If patient is at the pharmacy, call can be transferred to refill team.   1. Which medications need to be refilled? (please list name of each medication and dose if known)  metoprolol succinate (TOPROL-XL) 50 MG 24 hr tablet Take 1/2 tablet by mouth twice a day. Take with or immediately following a meal   NEEDS NAME BRAND NOT THE GENERIC  2. Which pharmacy/location (including street and city if local pharmacy) is medication to be sent to? [facility] [address]   3. Do they need a 30 day or 90 day supply?  51  Pt c/o medication issue:  1. Name of Medication:   metoprolol succinate (TOPROL-XL) 50 MG 24 hr tablet Take 1/2 tablet by mouth twice a day. Take with or immediately following a meal     2. How are you currently taking this medication (dosage and times per day)? 1/2 TABLET 2X A DAY  3. Are you having a reaction (difficulty breathing--STAT)? YES   4. What is your medication issue? THE GENERIC IS MAKING HER NAUSEA , she is feeling off, wants to go back to the name brand

## 2016-09-19 NOTE — Telephone Encounter (Signed)
Pt called and to conf. Med  TOPOROL was sent in to [facility]  [phone]  thanks

## 2016-09-19 NOTE — Telephone Encounter (Signed)
Advised patient of electronic refill confirmation for med, she voiced understanding and thanks. I did confirm correct medication, dispense, dosing, and receiving pharmacy.

## 2016-09-19 NOTE — Telephone Encounter (Signed)
Notified the pt that we switched her generic metoprolol succinate 50 mg tabs back to her original regimen of DAW Toprol XL 50 mg tabs, take 1/2 tablet po twice daily.  Pt states she tried the generic out for cost reasons, but this makes her feel nauseated.  Pt states she wants to go back to brand name Toprol XL.  Informed the pt that I sent this into her confirmed pharmacy of choice.  Pt verbalized understanding and gracious for all the assistance provided.

## 2016-11-21 ENCOUNTER — Other Ambulatory Visit: Payer: Self-pay | Admitting: Cardiology

## 2016-11-21 DIAGNOSIS — I2510 Atherosclerotic heart disease of native coronary artery without angina pectoris: Secondary | ICD-10-CM

## 2016-11-21 DIAGNOSIS — I119 Hypertensive heart disease without heart failure: Secondary | ICD-10-CM

## 2016-11-21 DIAGNOSIS — E782 Mixed hyperlipidemia: Secondary | ICD-10-CM

## 2016-11-27 ENCOUNTER — Ambulatory Visit (INDEPENDENT_AMBULATORY_CARE_PROVIDER_SITE_OTHER): Payer: Medicare Other | Admitting: Neurology

## 2016-11-27 ENCOUNTER — Encounter (INDEPENDENT_AMBULATORY_CARE_PROVIDER_SITE_OTHER): Payer: Self-pay

## 2016-11-27 ENCOUNTER — Encounter: Payer: Self-pay | Admitting: Neurology

## 2016-11-27 VITALS — BP 128/72 | HR 55 | Wt 169.0 lb

## 2016-11-27 DIAGNOSIS — I6990 Unspecified sequelae of unspecified cerebrovascular disease: Secondary | ICD-10-CM

## 2016-11-27 NOTE — Patient Instructions (Signed)
I had a long d/w patient about her remote stroke, risk for recurrent stroke/TIAs, personally independently reviewed imaging studies and stroke evaluation results and answered questions.Continue aspirin 81 mg daily  for secondary stroke prevention and maintain strict control of hypertension with blood pressure goal below 130/90, diabetes with hemoglobin A1c goal below 6.5% and lipids with LDL cholesterol goal below 70 mg/dL. I also advised the patient to eat a healthy diet with plenty of whole grains, cereals, fruits and vegetables, exercise regularly and maintain ideal body weight Followup in the future with me only as needed and no scheduled routine appointment is necessary. F/U with Dr [name] for aneurysms.

## 2016-11-27 NOTE — Progress Notes (Signed)
[facility] [address] [place]. [place] [address] [phone]       OFFICE FOLLOW-UP NOTE  Ms. LADONA ROSTEN Date of Birth:  March 05, 1945 Medical Record Number:  [id number]   HPI:  Office visit 05/31/2015 Ms Wangerin  is a 71 year old lady seen today for first office follow-up visit following hospital admission for TIA in February 2017. She is a complaint by daughter today.She was at home 03/12/2015 when at 1030 "she felt fuzzy headed and not right. Daughter noted she was having a hard time getting her thoughts out". This lasted until 12. She was brought to the hospital for fear of her having a stroke. Where her  fully cleared. Patient was not administered TPA . She was admitted for further evaluation and treatment. MRI scan of the brain showed Multiple scattered small round foci of restricted diffusion in the left periatrial white matter and along the lateral aspect of the left occipital horn in the left occipital lobe. There was a chronic age left basal ganglial lacunar infarct as well as multiple chronic but new microhemorrhages compared with previous MRI dated 2016. MRA showed no large vessel stenosis but showed successfully coiled bilateral ICA terminus aneurysms. Transthoracic echo showed normal ejection fraction. EEG showed intermittent left temporal sharp discharges but no definite epileptiform activity. LDL cholesterol was optimal at 63 and hemoglobin A1c was 5.5. Carotid ultrasound showed no significant extracranial stenosis. Patient infarcts on MRI were felt to be clinically silent and her dizziness was felt to be multifactorial and it cleared quickly. She was advised to continue aspirin and Plavix because of intracranial biplane stent aneurysms treatment. She has subsequently reduced and the Plavix to every other day because of significant bruising. She is has had no recurrent stroke or TIA symptoms. She is currently participating in  outpatient physical and occupational  therapy. She is concerned about swelling in her feet. She has no new stroke or TIA symptoms. Update 11/29/2015 : She returns for follow-up after last visit 6 months ago. He recommended by husband. Patient continues to have gait and balance difficulties but has been using a cane. She is currently getting outpatient physical therapy which seems to be helping a lot. She's had no recent falls or injuries. She has recently seen by Dr. Meda Coffee a cardiologist and everything checked out fine. She remains on aspirin and Plavix which is tolerating well with minor bruising but no bleeding episodes. Her blood pressure is well controlled and usually stays in the 109 to 3:23 systolic range. The patient discontinued Lipitor about a month ago as she ran out of it but after counseled her she is willing to go back on it. She is gained 20 pounds weight despite stating that she eats healthy and is quite active. She has not discussed this yet with her primary physician. Update 11/27/2016: She returns for follow-up after last visit a year ago.  She continues to do well without recurrent stroke or TIA symptoms.  She remains on aspirin which is tolerating well without bruising or bleeding.  She discontinued the Lipitor as she states family physicians said that she did not needed in May 2018.  She has not had any follow-up lipid profile checked.  She states her blood pressure is well controlled and today it is 1 2 8/72.  She has not had any recurrent TIA or stroke symptoms.  She did see Dr. [name] in May who did CT angiogram of the brain and neck and found stable appearance of the aneurysms  and has a follow-up appointment with him in a few more weeks.  She did have elective gallbladder surgery in February 2018 and was seen for neurological clearance by Dr. Erlinda Hong in the hospital.  She has no new complaints today. ROS:   14 system review of systems is positive for  no complaints except , walking difficulty and gait imbalance and leg swelling  only only all other systems negative  PMH:  Past Medical History:  Diagnosis Date  . COPD (chronic obstructive pulmonary disease) ([place])   . Coronary artery disease 1996   Known with prior mild lesion of LAD demonstrated by Cardiac Catheterization in 1996  . Edema of foot    She has a history of chronic edema of the left dated back to age 38 when she suufered severe frostbite playing  on the snow as a child  . Hypertension   . PAC (premature atrial contraction)   . Pancreatitis    x2  . PONV (postoperative nausea and vomiting)    problems waking up last time 4/16 only time  . 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  Marland Kitchen Shortness of breath dyspnea    since stroke 2 months ago -  . 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 TIA (transient ischemic attack)    She was hospitalized 04-23-09 through 04-27-09 for involving right side of the body  . Tobacco abuse    Ongoing   . Ventricular hypertrophy 04/2009   LVH with diastolic dysfunction by echo. Has normal EF.    Social History:  Social History   Socioeconomic History  . Marital status: Widowed    Spouse name: Not on file  . Number of children: Not on file  . Years of education: Not on file  . Highest education level: Not on file  Social Needs  . Financial resource strain: Not on file  . Food insecurity - worry: Not on file  . Food insecurity - inability: Not on file  . Transportation needs - medical: Not on file  . Transportation needs - non-medical: Not on file  Occupational History  . Not on file  Tobacco Use  . Smoking status: Former Smoker    Packs/day: 1.00    Years: 30.00    Pack years: 30.00    Types: Cigarettes    Last attempt to quit: 04/26/2014    Years since quitting: 2.5  . Smokeless tobacco: Never Used  Substance and Sexual  Activity  . Alcohol use: No  . Drug use: No  . Sexual activity: Not on file  Other Topics Concern  . Not on file  Social History Narrative  . Not on file    Medications:   Current Outpatient Medications on File Prior to Visit  Medication Sig Dispense Refill  . albuterol (PROVENTIL HFA;VENTOLIN HFA) 108 (90 BASE) MCG/ACT inhaler Inhale 1-2 puffs into the lungs every 6 (six) hours as needed for wheezing or shortness of breath.    Marland Kitchen aspirin EC 81 MG tablet Take 81 mg by mouth daily at 12 noon.    Marland Kitchen lisinopril-hydrochlorothiazide (PRINZIDE,ZESTORETIC) 20-12.5 MG tablet TAKE 1 TABLET BY MOUTH DAILY. 30 tablet 7  . TOPROL XL 50 MG 24 hr tablet Take 1/2 tablet by mouth twice daily.  Take with or  immediately following a meal. 90 tablet 2   No current facility-administered medications on file prior to visit.     Allergies:   Allergies  Allergen Reactions  . Dilaudid [Hydromorphone] Swelling  . Codeine Nausea And Vomiting  . Latex Swelling    gloves used at dental office caused lips to swell. Used different ones no problem. Never had any problem at hospital or anywhere else.  . Lidocaine     "makes my heart race"  . Sulfa Drugs Cross Reactors Other (See Comments)    Unknown childhood reaction    Physical Exam General: well developed, well nourished,elderly lady seated, in no evident distress Head: head normocephalic and atraumatic.  Neck: supple with no carotid or supraclavicular bruits Cardiovascular: regular rate and rhythm, no murmurs Musculoskeletal: no deformity Skin:  no rash/petichiae.Old surgical scar right supraclavicular neck.  1+ pedal edema bilaterally Vascular:  Normal pulses all extremities Vitals:   11/27/16 0942  BP: 128/72  Pulse: (!) 55   Neurologic Exam Mental Status: Awake and fully alert. Oriented to place and time. Recent and remote memory intact. Attention span, concentration and fund of knowledge appropriate. Mood and affect appropriate.  Cranial Nerves:  Fundoscopic exam Not done today Pupils equal, briskly reactive to light. Extraocular movements full without nystagmus. Visual fields full to confrontation. Hearing intact. Facial sensation intact. Face, tongue, palate moves normally and symmetrically.  Motor: Mild spastic left hemiparesis with 4/5 strength. Weakness of left grip and intrinsic hand muscles. Orbits right over left upper extremity. Increased tone in the left leg. Sensory.: intact to touch ,pinprick .position and vibratory sensation.  Coordination: Rapid alternating movements normal in all extremities. Finger-to-nose and heel-to-shin performed accurately bilaterally. Gait and Station: Arises from chair without difficulty. Stance is normal. Gait demonstrates mild dragging of the left leg. and uses a cane  Reflexes: 1+ and symmetric. Toes downgoing.      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 Vascular risk factors of hypertension, CAD,Hyperlipidimia and cerebrovascular disease. Patient is stable from neurovascular standpoint    PLAN: I had a long d/w patient about her remote stroke, risk for recurrent stroke/TIAs, personally independently reviewed imaging studies and stroke evaluation results and answered questions.Continue aspirin 81 mg daily  for secondary stroke prevention and maintain strict control of hypertension with blood pressure goal below 130/90, diabetes with hemoglobin A1c goal below 6.5% and lipids with LDL cholesterol goal below 70 mg/dL. I also advised the patient to eat a healthy diet with plenty of whole grains, cereals, fruits and vegetables, exercise regularly and maintain ideal body weight Followup in the future with me only as needed and no scheduled routine appointment is necessary. F/U with Dr [name] for aneurysms.The duration of this  appointment visit was 30 minutes of face-to-face time with the patient. Greater than 50% of this time was spent in counseling, explanation of diagnosis,of stroke, aneurysms, planning of further management, and coordination of care.      Antony Contras, MD  [facility] [address] [place], [place] [address]  Phone [phone] Fax [phone]  Note: This document was prepared with digital dictation and possible smart phrase technology. Any transcriptional errors that result from this process are unintentional

## 2016-11-28 ENCOUNTER — Telehealth: Payer: Self-pay

## 2016-11-28 LAB — LIPID PANEL
CHOL/HDL RATIO: 2.3 ratio (ref 0.0–4.4)
Cholesterol, Total: 147 mg/dL (ref 100–199)
HDL: 65 mg/dL (ref >39)
LDL CALC: 56 mg/dL (ref 0–99)
Triglycerides: 129 mg/dL (ref 0–149)
VLDL Cholesterol Cal: 26 mg/dL (ref 5–40)

## 2016-11-28 NOTE — Telephone Encounter (Signed)
Left vm for patient to call back about lab work results. ------ 

## 2016-11-28 NOTE — Telephone Encounter (Signed)
Notes recorded by Marval Regal, RN on 11/28/2016 at 11:41 AM EST Patient was given lipid panel profile is satisfactory. Pt verbalized understanding per Lattie Haw in phone room.Marland Kitchen

## 2016-11-28 NOTE — Telephone Encounter (Signed)
-----   Message from Garvin Fila, MD sent at 11/28/2016  8:28 AM EST ----- Kindly let patient know that her lipid profile is quite satisfactory. No changes at this time

## 2016-11-28 NOTE — Telephone Encounter (Signed)
Pt called back and message was relayed to her. No call back requested

## 2016-12-20 ENCOUNTER — Ambulatory Visit: Payer: Medicare Other | Attending: Internal Medicine | Admitting: Physical Therapy

## 2016-12-20 ENCOUNTER — Encounter: Payer: Self-pay | Admitting: Physical Therapy

## 2016-12-20 DIAGNOSIS — I69352 Hemiplegia and hemiparesis following cerebral infarction affecting left dominant side: Secondary | ICD-10-CM | POA: Diagnosis present

## 2016-12-20 DIAGNOSIS — M6281 Muscle weakness (generalized): Secondary | ICD-10-CM | POA: Diagnosis present

## 2016-12-20 DIAGNOSIS — R2689 Other abnormalities of gait and mobility: Secondary | ICD-10-CM | POA: Diagnosis not present

## 2016-12-21 NOTE — Therapy (Signed)
[facility] [address] [place], [place], [address] Phone: [phone]   Fax:  [phone]  Physical Therapy Evaluation  Patient Details  Name: Jeanne Lawson MRN: [id number] Date of Birth: 12-Jul-1945 Referring Provider: Dr. [name]   Encounter Date: 12/20/2016  PT End of Session - 12/21/16 1132    Visit Number  1 G1    Number of Visits  9    Date for PT Re-Evaluation  02/18/17    Authorization Type  UHC Medicare    Authorization Time Period  12-20-16 - 02-18-17    PT Start Time  0847    PT Stop Time  0933    PT Time Calculation (min)  46 min       Past Medical History:  Diagnosis Date  . COPD (chronic obstructive pulmonary disease) ([place])   . Coronary artery disease 1996   Known with prior mild lesion of LAD demonstrated by Cardiac Catheterization in 1996  . Edema of foot    She has a history of chronic edema of the left dated back to age 51 when she suufered severe frostbite playing  on the snow as a child  . Hypertension   . PAC (premature atrial contraction)   . Pancreatitis    x2  . PONV (postoperative nausea and vomiting)    problems waking up last time 4/16 only time  . 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  Marland Kitchen Shortness of breath dyspnea    since stroke 2 months ago -  . 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 TIA (transient ischemic attack)    She was hospitalized 04-23-09 through 04-27-09 for involving right side of the body  . Tobacco abuse    Ongoing   . Ventricular hypertrophy 04/2009   LVH with diastolic dysfunction by echo. Has normal EF.    Past Surgical History:  Procedure Laterality Date  . ABDOMINAL HYSTERECTOMY    . APPENDECTOMY    . CARDIAC CATHETERIZATION  1996   Mild CAD with vasospasm  .  CARDIOVASCULAR STRESS TEST  12-02-2001   EF 70%  . CHOLECYSTECTOMY N/A 03/09/2016   Procedure: LAPAROSCOPIC CHOLECYSTECTOMY WITH INTRAOPERATIVE CHOLANGIOGRAM;  Surgeon: Georganna Skeans, MD;  Location: [facility];  Service: General;  Laterality: N/A;  . ENDARTERECTOMY Right 08/12/2014   Procedure: RIGHT  COMMON CAROTID ARTERY EXPOSURE FOR INTERVENTIONAL RADIOLOGY PROCEDURE BY DR.DEVASHWAR,Insertion 6 FR sheath;  Surgeon: Elam Dutch, MD;  Location: [facility];  Service: Vascular;  Laterality: Right;  . ENDARTERECTOMY Right 08/12/2014   Procedure:  CAROTID  EXPOSURE CLOSURE RIGHT NECK, REPAIR RIGHT COMMON CAROTID ARTERY;  Surgeon: Elam Dutch, MD;  Location: [facility];  Service: Vascular;  Laterality: Right;  . ENDARTERECTOMY Left 11/18/2014   Procedure: CAROTID EXPOSURE;  Surgeon: Elam Dutch, MD;  Location: [facility];  Service: Vascular;  Laterality: Left;  . ENDARTERECTOMY Left 11/18/2014   Procedure: CLOSURE CAROTID;  Surgeon: Elam Dutch, MD;  Location: [facility];  Service: Vascular;  Laterality: Left;  . IR RADIOLOGIST EVAL & MGMT  06/26/2016  . LAPAROSCOPY    . NECK SURGERY  50 yrs ago   Left side tumor  . RADIOLOGY WITH ANESTHESIA N/A 05/25/2014   Procedure: RADIOLOGY WITH ANESTHESIA;  Surgeon: Willaim Rayas  [name], MD;  Location: [facility];  Service: Radiology;  Laterality: N/A;  . RADIOLOGY WITH ANESTHESIA N/A 08/12/2014   Procedure: RADIOLOGY WITH ANESTHESIA;  Surgeon: [name], MD;  Location: [facility];  Service: Radiology;  Laterality: N/A;  . RADIOLOGY WITH ANESTHESIA N/A 03/15/2015   Procedure: MRI OF BRAIN WITHOUT CONTRAST;  Surgeon: Medication Radiologist, MD;  Location: [facility];  Service: Radiology;  Laterality: N/A;  DR. TAT/MRI  . US ECHOCARDIOGRAPHY  04-26-2009   EF 65-70%  . VESICOVAGINAL FISTULA CLOSURE W/ TAH  25 yrs ago    There were no vitals filed for this visit.   Subjective Assessment - 12/21/16 1117    Subjective  Pt states she had emergency surgery for gall bladder surgery  on 03-06-16 - states her gall bladder was the reason for her c/o back pain when she was coming to PT in Feb. of this year; states she had to have surgery and this is reason she had to stop coming to PT    Pertinent History  h/o old R CVA in 2010; TIA 04-23-09 - 04-27-09:  CAD:  COPD:  chronic edema in L foot dated back to age 57: saccular aneurysm, paroxysmal atrial fibrillation, known cerebral aneurysms by MRA in 2011; ventricular hypertrophy:  HTN; gall bladder surgery Feb. 12, 2018    Diagnostic tests  MRI on 03-17-15; previous MRI's due to previous CVA    Patient Stated Goals  increase LLE strength and walk better, and increase arm strength    Currently in Pain?  No/denies         [facility] PT Assessment - 12/21/16 0001      Assessment   Medical Diagnosis  LLE weakness due to cerebral aneurysm    Referring Provider  Dr. [name]    Onset Date/Surgical Date  03/12/15 gall bladder surgery Feb. 2018      Precautions   Precautions  Fall    Precaution Comments  L foot drags      Restrictions   Weight Bearing Restrictions  No      Balance Screen   Has the patient fallen in the past 6 months  No    Has the patient had a decrease in activity level because of a fear of falling?   No    Is the patient reluctant to leave their home because of a fear of falling?   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 [place]      ROM / Strength   AROM / PROM / Strength  Strength      Strength   Right/Left Shoulder  Left    Left Shoulder Flexion  3+/5    Left Hip Flexion  4/5    Left Knee Flexion  4/5    Left Knee Extension  4+/5     Right/Left Ankle  Left    Left Ankle Dorsiflexion  3-/5    Left Ankle Plantar Flexion  3-/5  Transfers   Transfers  Sit to Stand    Number of Reps  Other reps (comment) 1    Comments  able to stand without UE support      Ambulation/Gait   Ambulation/Gait  Yes    Ambulation/Gait Assistance  5: Supervision    Ambulation/Gait Assistance Details  decreased Lt dorsiflexion due to moderate edema in LLE and ankle/foot    Ambulation Distance (Feet)  125 Feet    Assistive device  Straight cane    Gait Pattern  Decreased dorsiflexion - left;Poor foot clearance - left;Decreased step length - left;Decreased hip/knee flexion - left;Left foot flat    Ambulation Surface  Level;Indoor    Gait velocity  12.22 secs = 2.68 ft/sec    Gait Comments  pt amb. 447' in 3" walk test with SPC      Timed Up and Go Test   Normal TUG (seconds)  12.06 with cane             Objective measurements completed on examination: See above findings.                PT Short Term Goals - 12/21/16 1143      PT SHORT TERM GOAL #1   Title  Increase gait velocity to >/= 2.90 ft/sec with cane for incr. gait efficiency.      Baseline  12.22 secs = 2.68 ft/sec on 12-20-16    Time  4    Period  Weeks    Status  New    Target Date  01/19/17      PT SHORT TERM GOAL #2   Title  Pt will report at least 25% improvement in ambulation with incr. ease with L foot clearance.      Time  4    Period  Weeks    Status  New    Target Date  01/19/17      PT SHORT TERM GOAL #3   Title  Incr. amb. distance to at least 525' in 3" walk test.    Baseline  447' in 3" walk test with [facility] - 12-20-16    Time  4    Period  Weeks    Status  New    Target Date  01/19/17      PT SHORT TERM GOAL #5   Title  Independent in HEP for  LLE strengthening exercises.     Time  4    Period  Weeks    Status  New    Target Date  01/19/17        PT Long Term Goals - 12/21/16 1149      PT LONG TERM GOAL #1   Title   Independent in updated HEP for LLE strengthening and stretching     Time  8    Period  Weeks    Status  New    Target Date  02/18/17      PT LONG TERM GOAL #2   Title  Report at least 50% improvement in LLE flexibility and ease of movement     Time  8    Period  Weeks    Status  New    Target Date  02/18/17      PT LONG TERM GOAL #3   Title  Improve TUG score to </= 10 secs to demo improved mobility.     Time  8    Period  Weeks    Status  New  Target Date  02/18/17      PT LONG TERM GOAL #4   Title  Incr. gait velocity to >/= 3.3 ft/sec with cane for incr. gait efficiency.      Baseline  2.68 ft/sec = 12.22 secs with cane    Time  8    Period  Weeks    Status  New    Target Date  02/18/17      PT LONG TERM GOAL #5   Title  Amb. at least 550' in 3" walk test with use of SPC to demo incr. step length and improved endurance.    Time  8    Period  Weeks    Status  New    Target Date  02/18/17             Plan - 12/21/16 1133    Clinical Impression Statement  Pt is a 71 yr old lady s/p cerebral aneurysm in April 2011 with residual LLE weakness.  Pt was hospitalized from 03-06-16 until 03-11-16 for emergency surgery to remove gall bladder due to cholecystitis.  Pt presents with moderate to severe edema in LLE including foot and ankle which limits flexibility.  Pt requires SPC for assistance with ambulation.  Pt presents with hihg level balance skills and LLE weakness.    History and Personal Factors relevant to plan of care:  Pt has received OP PT at this facility in the past (Nov. 2015 - Jan. 2016 and in 2017 and few visits in early 2018 prior to gall bladder sx)    Clinical Presentation  Stable    Clinical Presentation due to:  s/p cerebral aneurysm/TIA in April 2011 with residual LLE and LUE weakness; CVA 2016    Clinical Decision Making  Low    Rehab Potential  Good    PT Frequency  1x / week    PT Duration  8 weeks    PT Treatment/Interventions  ADLs/Self Care Home  Management;Therapeutic exercise;Therapeutic activities;Functional mobility training;Stair training;Gait training;DME Instruction;Balance training;Neuromuscular re-education;Patient/family education;Orthotic Fit/Training;Passive range of motion    PT Next Visit Plan  review HEP as previously established - LLE strengthening    PT Home Exercise Plan  Stretches and strengthening for LLE    Consulted and Agree with Plan of Care  Patient       Patient will benefit from skilled therapeutic intervention in order to improve the following deficits and impairments:  Abnormal gait, Cardiopulmonary status limiting activity, Decreased activity tolerance, Decreased balance, Decreased mobility, Decreased strength, Decreased knowledge of use of DME, Decreased endurance, Decreased coordination, Decreased range of motion, Increased edema, Impaired flexibility  Visit Diagnosis: Other abnormalities of gait and mobility - Plan: PT plan of care cert/re-cert  Muscle weakness (generalized) - Plan: PT plan of care cert/re-cert  Hemiplegia and hemiparesis following cerebral infarction affecting left dominant side ([place]) - Plan: PT plan of care cert/re-cert  G-Codes - 65/78/46 1200    Functional Assessment Tool Used (Outpatient Only)  Gait velocity 2.68 ft/sec with cane; 3" walk test  72' with cane    Functional Limitation  Mobility: Walking and moving around    Mobility: Walking and Moving Around Current Status ([id number])  At least 40 percent but less than 60 percent impaired, limited or restricted    Mobility: Walking and Moving Around Goal Status [phone])  At least 20 percent but less than 40 percent impaired, limited or restricted        Problem List Patient Active Problem List   Diagnosis  Date Noted  . Acute cholecystitis 03/07/2016  . 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 Aneurysm, cerebral, nonruptured 05/31/2015  . Dizziness and giddiness 05/31/2015  . Altered mental status   . Acute CVA  (cerebrovascular accident) ([place]) 03/15/2015  . Dysphasia 03/12/2015  . Thrombocytopenia ([place]) 03/12/2015  . Hypokalemia   . Endotracheally intubated   . Essential hypertension   . Intracranial aneurysm 11/18/2014  . Respiratory failure ([place])   . Cerebral aneurysm   . Brain aneurysm   . Aphasia   . Palmar erythema   . CVA (cerebral infarction) 05/02/2014  . Aneurysm ([place]) 05/02/2014  . Expressive aphasia 05/02/2014  . 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 Tobacco abuse 10/24/2010  . TIA (transient ischemic attack)   . Hypertension   . Ventricular hypertrophy   . CVA 02/23/2010  . STRESS FRACTURE, FOOT 02/22/2010    Alda Lea, PT 12/21/2016, 12:03 PM  [facility] [address] [place], [place], [address] Phone: [phone]   Fax:  [phone]  Name: Jeanne Lawson MRN: [id number] Date of Birth: [birth year]

## 2016-12-25 ENCOUNTER — Ambulatory Visit: Payer: Medicare Other | Admitting: Physical Therapy

## 2017-01-02 ENCOUNTER — Telehealth (HOSPITAL_COMMUNITY): Payer: Self-pay

## 2017-01-02 NOTE — Telephone Encounter (Signed)
Called to schedule f/u cta, left message for pt to return call. AW 

## 2017-01-04 ENCOUNTER — Ambulatory Visit: Payer: Medicare Other | Admitting: Physical Therapy

## 2017-01-08 ENCOUNTER — Ambulatory Visit: Payer: Medicare Other | Attending: Internal Medicine | Admitting: Physical Therapy

## 2017-01-08 DIAGNOSIS — R2689 Other abnormalities of gait and mobility: Secondary | ICD-10-CM | POA: Insufficient documentation

## 2017-01-08 DIAGNOSIS — M6281 Muscle weakness (generalized): Secondary | ICD-10-CM | POA: Insufficient documentation

## 2017-01-09 ENCOUNTER — Encounter: Payer: Self-pay | Admitting: Physical Therapy

## 2017-01-09 NOTE — Therapy (Signed)
[facility] [address] [place], [place], [address] Phone: [phone]   Fax:  [phone]  Physical Therapy Treatment  Patient Details  Name: Jeanne Lawson MRN: [id number] Date of Birth: [birth year] Referring Provider: Dr. [name]   Encounter Date: 01/08/2017  PT End of Session - 01/09/17 [place]    Visit Number  2 G2    Number of Visits  9    Date for PT Re-Evaluation  02/18/17    Authorization Type  UHC Medicare    Authorization Time Period  12-20-16 - 02-18-17    PT Start Time  1018    PT Stop Time  1102    PT Time Calculation (min)  44 min       Past Medical History:  Diagnosis Date  . COPD (chronic obstructive pulmonary disease) ([place])   . Coronary artery disease 1996   Known with prior mild lesion of LAD demonstrated by Cardiac Catheterization in 1996  . Edema of foot    She has a history of chronic edema of the left dated back to age 2 when she suufered severe frostbite playing  on the snow as a child  . Hypertension   . PAC (premature atrial contraction)   . Pancreatitis    x2  . PONV (postoperative nausea and vomiting)    problems waking up last time 4/16 only time  . 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  Marland Kitchen Shortness of breath dyspnea    since stroke 2 months ago -  . 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 TIA (transient ischemic attack)    She was hospitalized 04-23-09 through 04-27-09 for involving right side of the body  . Tobacco abuse    Ongoing   . Ventricular hypertrophy 04/2009   LVH with diastolic dysfunction by echo. Has normal EF.    Past Surgical History:  Procedure Laterality Date  . ABDOMINAL HYSTERECTOMY    . APPENDECTOMY    . CARDIAC CATHETERIZATION  1996   Mild CAD with vasospasm  .  CARDIOVASCULAR STRESS TEST  12-02-2001   EF 70%  . CHOLECYSTECTOMY N/A 03/09/2016   Procedure: LAPAROSCOPIC CHOLECYSTECTOMY WITH INTRAOPERATIVE CHOLANGIOGRAM;  Surgeon: Georganna Skeans, MD;  Location: [facility];  Service: General;  Laterality: N/A;  . ENDARTERECTOMY Right 08/12/2014   Procedure: RIGHT  COMMON CAROTID ARTERY EXPOSURE FOR INTERVENTIONAL RADIOLOGY PROCEDURE BY DR.DEVASHWAR,Insertion 6 FR sheath;  Surgeon: Elam Dutch, MD;  Location: [facility];  Service: Vascular;  Laterality: Right;  . ENDARTERECTOMY Right 08/12/2014   Procedure:  CAROTID  EXPOSURE CLOSURE RIGHT NECK, REPAIR RIGHT COMMON CAROTID ARTERY;  Surgeon: Elam Dutch, MD;  Location: [facility];  Service: Vascular;  Laterality: Right;  . ENDARTERECTOMY Left 11/18/2014   Procedure: CAROTID EXPOSURE;  Surgeon: Elam Dutch, MD;  Location: [facility];  Service: Vascular;  Laterality: Left;  . ENDARTERECTOMY Left 11/18/2014   Procedure: CLOSURE CAROTID;  Surgeon: Elam Dutch, MD;  Location: [facility];  Service: Vascular;  Laterality: Left;  . IR RADIOLOGIST EVAL & MGMT  06/26/2016  . LAPAROSCOPY    . NECK SURGERY  50 yrs ago   Left side tumor  . RADIOLOGY WITH ANESTHESIA N/A 05/25/2014   Procedure: RADIOLOGY WITH ANESTHESIA;  Surgeon: Willaim Rayas  [name], MD;  Location: [facility];  Service: Radiology;  Laterality: N/A;  . RADIOLOGY WITH ANESTHESIA N/A 08/12/2014   Procedure: RADIOLOGY WITH ANESTHESIA;  Surgeon: [name], MD;  Location: [facility];  Service: Radiology;  Laterality: N/A;  . RADIOLOGY WITH ANESTHESIA N/A 03/15/2015   Procedure: MRI OF BRAIN WITHOUT CONTRAST;  Surgeon: Medication Radiologist, MD;  Location: [facility];  Service: Radiology;  Laterality: N/A;  DR. TAT/MRI  . US ECHOCARDIOGRAPHY  04-26-2009   EF 65-70%  . VESICOVAGINAL FISTULA CLOSURE W/ TAH  25 yrs ago    There were no vitals filed for this visit.  Subjective Assessment - 01/09/17 1820    Subjective  Pt reports no changes since previous PT visit 2 weeks ago;  states she cancelled last week due to the snow     Pertinent History  h/o old R CVA in 2010; TIA 04-23-09 - 04-27-09:  CAD:  COPD:  chronic edema in L foot dated back to age 18: saccular aneurysm, paroxysmal atrial fibrillation, known cerebral aneurysms by MRA in 2011; ventricular hypertrophy:  HTN; gall bladder surgery Feb. 12, 2018    Diagnostic tests  MRI on 03-17-15; previous MRI's due to previous CVA    Patient Stated Goals  increase LLE strength and walk better, and increase arm strength    Currently in Pain?  No/denies                      [facility] Adult PT Treatment/Exercise - 01/09/17 0001      Transfers   Transfers  Sit to Stand    Number of Reps  Other reps (comment) 5    Comments  RLE on balance bubble for incr. LLE weight bearing       Knee/Hip Exercises: Stretches   Press photographer  Left;1 rep;30 seconds    Other Knee/Hip Stretches  Left hamstring/gastroc stretch 30 sec hold x 1 rep      Knee/Hip Exercises: Aerobic   Recumbent Bike  SciFit level 1.5 x 5" with UE & LE's      Knee/Hip Exercises: Standing   Heel Raises  Both;1 set;10 reps    Hip Flexion  Stengthening;Left;1 set;10 reps 2# weight    Forward Step Up  Left;1 set;10 reps;Hand Hold: 2;Step Height: 6"    Other Standing Knee Exercises  Pt performed hip abduction and extension LLE with 2# x 10 reps      LLE unilateral heel raise x 10 reps with bil. UE support  Stepping over and back of balance beam with LLE - inside // bars with UE support prn         PT Short Term Goals - 12/21/16 1143      PT SHORT TERM GOAL #1   Title  Increase gait velocity to >/= 2.90 ft/sec with cane for incr. gait efficiency.      Baseline  12.22 secs = 2.68 ft/sec on 12-20-16    Time  4    Period  Weeks    Status  New    Target Date  01/19/17      PT SHORT TERM GOAL #2   Title  Pt will report at least 25% improvement in ambulation with incr. ease with L foot clearance.      Time  4    Period  Weeks    Status  New     Target Date  01/19/17      PT SHORT TERM GOAL #3   Title  Incr. amb. distance to at least 525' in 3" walk test.    Baseline  447' in 3" walk test with [facility] - 12-20-16    Time  4    Period  Weeks    Status  New    Target Date  01/19/17      PT SHORT TERM GOAL #5   Title  Independent in HEP for  LLE strengthening exercises.     Time  4    Period  Weeks    Status  New    Target Date  01/19/17        PT Long Term Goals - 12/21/16 1149      PT LONG TERM GOAL #1   Title  Independent in updated HEP for LLE strengthening and stretching     Time  8    Period  Weeks    Status  New    Target Date  02/18/17      PT LONG TERM GOAL #2   Title  Report at least 50% improvement in LLE flexibility and ease of movement     Time  8    Period  Weeks    Status  New    Target Date  02/18/17      PT LONG TERM GOAL #3   Title  Improve TUG score to </= 10 secs to demo improved mobility.     Time  8    Period  Weeks    Status  New    Target Date  02/18/17      PT LONG TERM GOAL #4   Title  Incr. gait velocity to >/= 3.3 ft/sec with cane for incr. gait efficiency.      Baseline  2.68 ft/sec = 12.22 secs with cane    Time  8    Period  Weeks    Status  New    Target Date  02/18/17      PT LONG TERM GOAL #5   Title  Amb. at least 550' in 3" walk test with use of SPC to demo incr. step length and improved endurance.    Time  8    Period  Weeks    Status  New    Target Date  02/18/17            Plan - 01/09/17 1830    Clinical Impression Statement  Pt has decreased Lt dorsiflexion  and decreased Lt hip/knee flexion in stance; significant edema in LLE decreases/impacts ROM and limits flexibility. Pt also has decreased SLS on LLE, requiring use of SPC for safety.    Rehab Potential  Good    PT Frequency  1x / week    PT Duration  8 weeks    PT Treatment/Interventions  ADLs/Self Care Home Management;Therapeutic exercise;Therapeutic activities;Functional mobility training;Stair  training;Gait training;DME Instruction;Balance training;Neuromuscular re-education;Patient/family education;Orthotic Fit/Training;Passive range of motion    PT Next Visit Plan  review HEP as previously established - LLE strengthening    PT Home Exercise Plan  Stretches and strengthening for LLE    Consulted and Agree with Plan of Care  Patient       Patient will benefit from skilled therapeutic intervention in order to improve the following deficits and impairments:  Abnormal gait, Cardiopulmonary status limiting activity, Decreased activity tolerance, Decreased balance, Decreased mobility, Decreased strength, Decreased knowledge of use of DME, Decreased endurance, Decreased coordination, Decreased range of motion, Increased edema, Impaired flexibility  Visit Diagnosis: Muscle weakness (generalized)  Other  abnormalities of gait and mobility     Problem List Patient Active Problem List   Diagnosis Date Noted  . Acute cholecystitis 03/07/2016  . 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 Aneurysm, cerebral, nonruptured 05/31/2015  . Dizziness and giddiness 05/31/2015  . Altered mental status   . Acute CVA (cerebrovascular accident) ([place]) 03/15/2015  . Dysphasia 03/12/2015  . Thrombocytopenia ([place]) 03/12/2015  . Hypokalemia   . Endotracheally intubated   . Essential hypertension   . Intracranial aneurysm 11/18/2014  . Respiratory failure ([place])   . Cerebral aneurysm   . Brain aneurysm   . Aphasia   . Palmar erythema   . CVA (cerebral infarction) 05/02/2014  . Aneurysm ([place]) 05/02/2014  . Expressive aphasia 05/02/2014  . 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 Tobacco abuse 10/24/2010  . TIA (transient ischemic attack)   . Hypertension   . Ventricular hypertrophy   . CVA 02/23/2010  . STRESS FRACTURE, FOOT 02/22/2010    Alda Lea, PT 01/09/2017, 6:36 PM  [facility] [address] [place], [place], [address] Phone: [phone]   Fax:  [phone]  Name: Jeanne Lawson MRN: [id number] Date of Birth: [birth year]

## 2017-01-25 ENCOUNTER — Ambulatory Visit: Payer: Medicare Other | Attending: Internal Medicine | Admitting: Physical Therapy

## 2017-01-25 ENCOUNTER — Encounter: Payer: Self-pay | Admitting: Physical Therapy

## 2017-01-25 DIAGNOSIS — R2689 Other abnormalities of gait and mobility: Secondary | ICD-10-CM | POA: Insufficient documentation

## 2017-01-25 DIAGNOSIS — R2681 Unsteadiness on feet: Secondary | ICD-10-CM | POA: Diagnosis present

## 2017-01-25 DIAGNOSIS — M6281 Muscle weakness (generalized): Secondary | ICD-10-CM | POA: Insufficient documentation

## 2017-01-25 NOTE — Therapy (Signed)
[facility] [address] [place], [place], [address] Phone: [phone]   Fax:  [phone]  Physical Therapy Treatment  Patient Details  Name: Jeanne Lawson MRN: [id number] Date of Birth: 1945-08-13 Referring Provider: Dr. [name]   Encounter Date: 01/25/2017  PT End of Session - 01/25/17 1258    Visit Number  3    Number of Visits  9    Date for PT Re-Evaluation  02/18/17    Authorization Type  [employer] Medicare    Authorization Time Period  12-20-16 - 02-18-17    PT Start Time  0926    PT Stop Time  1013    PT Time Calculation (min)  47 min       Past Medical History:  Diagnosis Date  . COPD (chronic obstructive pulmonary disease) ([place])   . Coronary artery disease 1996   Known with prior mild lesion of LAD demonstrated by Cardiac Catheterization in 1996  . Edema of foot    She has a history of chronic edema of the left dated back to age 22 when she suufered severe frostbite playing  on the snow as a child  . Hypertension   . PAC (premature atrial contraction)   . Pancreatitis    x2  . PONV (postoperative nausea and vomiting)    problems waking up last time 4/16 only time  . 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  Marland Kitchen Shortness of breath dyspnea    since stroke 2 months ago -  . 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 TIA (transient ischemic attack)    She was hospitalized 04-23-09 through 04-27-09 for involving right side of the body  . Tobacco abuse    Ongoing   . Ventricular hypertrophy 04/2009   LVH with diastolic dysfunction by echo. Has normal EF.    Past Surgical History:  Procedure Laterality Date  . ABDOMINAL HYSTERECTOMY    . APPENDECTOMY    . CARDIAC CATHETERIZATION  1996   Mild CAD with vasospasm  .  CARDIOVASCULAR STRESS TEST  12-02-2001   EF 70%  . CHOLECYSTECTOMY N/A 03/09/2016   Procedure: LAPAROSCOPIC CHOLECYSTECTOMY WITH INTRAOPERATIVE CHOLANGIOGRAM;  Surgeon: Georganna Skeans, MD;  Location: [place];  Service: General;  Laterality: N/A;  . ENDARTERECTOMY Right 08/12/2014   Procedure: RIGHT  COMMON CAROTID ARTERY EXPOSURE FOR INTERVENTIONAL RADIOLOGY PROCEDURE BY DR.DEVASHWAR,Insertion 6 FR sheath;  Surgeon: Elam Dutch, MD;  Location: [place];  Service: Vascular;  Laterality: Right;  . ENDARTERECTOMY Right 08/12/2014   Procedure:  CAROTID  EXPOSURE CLOSURE RIGHT NECK, REPAIR RIGHT COMMON CAROTID ARTERY;  Surgeon: Elam Dutch, MD;  Location: [place];  Service: Vascular;  Laterality: Right;  . ENDARTERECTOMY Left 11/18/2014   Procedure: CAROTID EXPOSURE;  Surgeon: Elam Dutch, MD;  Location: [place];  Service: Vascular;  Laterality: Left;  . ENDARTERECTOMY Left 11/18/2014   Procedure: CLOSURE CAROTID;  Surgeon: Elam Dutch, MD;  Location: [place];  Service: Vascular;  Laterality: Left;  . IR RADIOLOGIST EVAL & MGMT  06/26/2016  . LAPAROSCOPY    . NECK SURGERY  50 yrs ago   Left side tumor  . RADIOLOGY WITH ANESTHESIA N/A 05/25/2014   Procedure: RADIOLOGY WITH ANESTHESIA;  Surgeon: [name],  MD;  Location: [place];  Service: Radiology;  Laterality: N/A;  . RADIOLOGY WITH ANESTHESIA N/A 08/12/2014   Procedure: RADIOLOGY WITH ANESTHESIA;  Surgeon: [name], MD;  Location: [place];  Service: Radiology;  Laterality: N/A;  . RADIOLOGY WITH ANESTHESIA N/A 03/15/2015   Procedure: MRI OF BRAIN WITHOUT CONTRAST;  Surgeon: Medication Radiologist, MD;  Location: [place];  Service: Radiology;  Laterality: N/A;  DR. TAT/MRI  . US ECHOCARDIOGRAPHY  04-26-2009   EF 65-70%  . VESICOVAGINAL FISTULA CLOSURE W/ TAH  25 yrs ago    There were no vitals filed for this visit.  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 12, 2018    Diagnostic tests  MRI on 03-17-15; previous MRI's due to previous CVA    Patient Stated Goals  increase LLE strength and walk better, and increase arm strength    Currently in Pain?  No/denies                      [facility] Adult PT Treatment/Exercise - 01/25/17 0942      Transfers   Transfers  Sit to Stand    Number of Reps  10 reps 5 reps on floor:  5 reps with RLE on balance bubble      Knee/Hip Exercises: Machines for Strengthening   Cybex Leg Press  Leg press bil. LE's 40# 20 reps:  LLE only 25# 20 reps      Knee/Hip Exercises: Standing   Heel Raises  Both;1 set;10 reps LLE only 10 reps    Hip Flexion  Stengthening;Left;1 set;10 reps 2# on LLE    Forward Step Up  Left;1 set;10 reps;Step Height: 6"    Step Down  Left;1 set;10 reps;Step Height: 6"    Other Standing Knee Exercises  Pt performed hip abduction and extension LLE with 2# x 10 reps      Standing on BOSU inside // bars - squats 10 reps; moving each leg out/in and forward/back 5 reps each leg with minimal bil. UE support   Lifting LLE over and back of balance beam; 5 reps with no weight;  With 2# weight on LLE 5 reps;  10 reps with RLE for Lt SLS - minimal UE support  Lifting each leg to 90 degrees of hip flexion in standing without UE support on // bars  Standing stretches - Lt hamstring and heel cord stretch in standing (runner's stretch) x 30 sec hold Lt gastroc stretch with use of bottom shelf of cabinet - 30 sec hold LLE      PT Short Term Goals - 12/21/16 1143      PT SHORT TERM GOAL #1   Title  Increase gait velocity to >/= 2.90 ft/sec with cane for incr. gait efficiency.       Baseline  12.22 secs = 2.68 ft/sec on 12-20-16    Time  4    Period  Weeks    Status  New    Target Date  01/19/17      PT SHORT TERM GOAL #2   Title  Pt will report at least 25% improvement in ambulation with incr. ease with L foot clearance.      Time  4    Period  Weeks    Status  New    Target Date  01/19/17      PT SHORT TERM GOAL #3   Title  Incr. amb. distance to at least 525' in 3" walk test.    Baseline  447' in 3" walk test with [facility] - 12-20-16    Time  4    Period  Weeks    Status  New    Target Date  01/19/17      PT SHORT TERM GOAL #5   Title  Independent in HEP for  LLE strengthening exercises.     Time  4    Period  Weeks    Status  New    Target Date  01/19/17        PT Long Term Goals - 12/21/16 1149      PT LONG TERM GOAL #1   Title  Independent in updated HEP for LLE strengthening and stretching     Time  8    Period  Weeks    Status  New    Target Date  02/18/17      PT LONG TERM GOAL #2   Title  Report at least 50% improvement in LLE flexibility and ease of movement     Time  8    Period  Weeks    Status  New    Target Date  02/18/17      PT LONG TERM GOAL #3   Title  Improve TUG score to </= 10 secs to demo improved mobility.     Time  8    Period  Weeks    Status  New    Target Date  02/18/17      PT LONG TERM GOAL #4   Title  Incr. gait velocity to >/= 3.3 ft/sec with cane for incr. gait efficiency.      Baseline  2.68 ft/sec = 12.22 secs with cane    Time  8    Period  Weeks    Status  New    Target Date  02/18/17      PT LONG TERM GOAL #5   Title  Amb. at least 550' in 3" walk test with use of SPC to demo incr. step length and improved endurance.    Time  8    Period  Weeks    Status  New    Target Date  02/18/17            Plan - 01/25/17 1300    Clinical Impression Statement  Pt has significant edema in LLE which impacts flexibility; pt fatigues easily with LLE strengthening exercises but recovers quickly with  short rest breaks. Pt continues to use cane for assistance with amb. due to LLE SLS deficits.     Rehab Potential  Good    PT Frequency  1x / week    PT Duration  8 weeks    PT Treatment/Interventions  ADLs/Self Care Home Management;Therapeutic exercise;Therapeutic activities;Functional mobility training;Stair training;Gait training;DME Instruction;Balance training;Neuromuscular re-education;Patient/family education;Orthotic Fit/Training;Passive range of motion    PT Next Visit Plan  review HEP as previously established - LLE strengthening    PT Home Exercise Plan  Stretches and strengthening for LLE    Consulted and Agree with Plan of Care  Patient  Patient will benefit from skilled therapeutic intervention in order to improve the following deficits and impairments:  Abnormal gait, Cardiopulmonary status limiting activity, Decreased activity tolerance, Decreased balance, Decreased mobility, Decreased strength, Decreased knowledge of use of DME, Decreased endurance, Decreased coordination, Decreased range of motion, Increased edema, Impaired flexibility  Visit Diagnosis: Muscle weakness (generalized)  Unsteadiness on feet     Problem List Patient Active Problem List   Diagnosis Date Noted  . Acute cholecystitis 03/07/2016  . 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 Aneurysm, cerebral, nonruptured 05/31/2015  . Dizziness and giddiness 05/31/2015  . Altered mental status   . Acute CVA (cerebrovascular accident) ([place]) 03/15/2015  . Dysphasia 03/12/2015  . Thrombocytopenia ([place]) 03/12/2015  . Hypokalemia   . Endotracheally intubated   . Essential hypertension   . Intracranial aneurysm 11/18/2014  . Respiratory failure ([place])   . Cerebral aneurysm   . Brain aneurysm   . Aphasia   . Palmar erythema   . CVA (cerebral infarction) 05/02/2014  . Aneurysm ([place]) 05/02/2014  . Expressive aphasia 05/02/2014  . 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 Chronic ischemic heart  disease 11/13/2011  . Tobacco abuse 10/24/2010  . TIA (transient ischemic attack)   . Hypertension   . Ventricular hypertrophy   . CVA 02/23/2010  . STRESS FRACTURE, FOOT 02/22/2010    Alda Lea, PT 01/25/2017, 1:09 PM  [facility] [address] [place], [place], [address] Phone: [phone]   Fax:  [phone]  Name: Jeanne Lawson MRN: [id number] Date of Birth: 07/19/1945

## 2017-01-29 ENCOUNTER — Ambulatory Visit: Payer: Medicare Other | Admitting: Physical Therapy

## 2017-01-29 DIAGNOSIS — M6281 Muscle weakness (generalized): Secondary | ICD-10-CM

## 2017-01-29 DIAGNOSIS — R2689 Other abnormalities of gait and mobility: Secondary | ICD-10-CM

## 2017-01-30 ENCOUNTER — Encounter: Payer: Self-pay | Admitting: Physical Therapy

## 2017-01-30 NOTE — Therapy (Signed)
[facility] [address] [place], [place], [address] Phone: [phone]   Fax:  [phone]  Physical Therapy Treatment  Patient Details  Name: Jeanne Lawson MRN: [id number] Date of Birth: [birth year] Referring Provider: Dr. [name]   Encounter Date: 01/29/2017  PT End of Session - 01/30/17 1721    Visit Number  4    Number of Visits  9    Date for PT Re-Evaluation  02/18/17    Authorization Type  UHC Medicare    Authorization Time Period  12-20-16 - 02-18-17    PT Start Time  1017    PT Stop Time  1100    PT Time Calculation (min)  43 min       Past Medical History:  Diagnosis Date  . COPD (chronic obstructive pulmonary disease) ([place])   . Coronary artery disease 1996   Known with prior mild lesion of LAD demonstrated by Cardiac Catheterization in 1996  . Edema of foot    She has a history of chronic edema of the left dated back to age 14 when she suufered severe frostbite playing  on the snow as a child  . Hypertension   . PAC (premature atrial contraction)   . Pancreatitis    x2  . PONV (postoperative nausea and vomiting)    problems waking up last time 4/16 only time  . 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  Marland Kitchen Shortness of breath dyspnea    since stroke 2 months ago -  . 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 TIA (transient ischemic attack)    She was hospitalized 04-23-09 through 04-27-09 for involving right side of the body  . Tobacco abuse    Ongoing   . Ventricular hypertrophy 04/2009   LVH with diastolic dysfunction by echo. Has normal EF.    Past Surgical History:  Procedure Laterality Date  . ABDOMINAL HYSTERECTOMY    . APPENDECTOMY    . CARDIAC CATHETERIZATION  1996   Mild CAD with vasospasm  .  CARDIOVASCULAR STRESS TEST  12-02-2001   EF 70%  . CHOLECYSTECTOMY N/A 03/09/2016   Procedure: LAPAROSCOPIC CHOLECYSTECTOMY WITH INTRAOPERATIVE CHOLANGIOGRAM;  Surgeon: Georganna Skeans, MD;  Location: [facility] [place];  Service: General;  Laterality: N/A;  . ENDARTERECTOMY Right 08/12/2014   Procedure: RIGHT  COMMON CAROTID ARTERY EXPOSURE FOR INTERVENTIONAL RADIOLOGY PROCEDURE BY DR.DEVASHWAR,Insertion 6 FR sheath;  Surgeon: Elam Dutch, MD;  Location: [place];  Service: Vascular;  Laterality: Right;  . ENDARTERECTOMY Right 08/12/2014   Procedure:  CAROTID  EXPOSURE CLOSURE RIGHT NECK, REPAIR RIGHT COMMON CAROTID ARTERY;  Surgeon: Elam Dutch, MD;  Location: [place];  Service: Vascular;  Laterality: Right;  . ENDARTERECTOMY Left 11/18/2014   Procedure: CAROTID EXPOSURE;  Surgeon: Elam Dutch, MD;  Location: [place];  Service: Vascular;  Laterality: Left;  . ENDARTERECTOMY Left 11/18/2014   Procedure: CLOSURE CAROTID;  Surgeon: Elam Dutch, MD;  Location: [place];  Service: Vascular;  Laterality: Left;  . IR RADIOLOGIST EVAL & MGMT  06/26/2016  . LAPAROSCOPY    . NECK SURGERY  50 yrs ago   Left side tumor  . RADIOLOGY WITH ANESTHESIA N/A 05/25/2014   Procedure: RADIOLOGY WITH ANESTHESIA;  Surgeon: [name],  MD;  Location: [facility];  Service: Radiology;  Laterality: N/A;  . RADIOLOGY WITH ANESTHESIA N/A 08/12/2014   Procedure: RADIOLOGY WITH ANESTHESIA;  Surgeon: [name], MD;  Location: [place];  Service: Radiology;  Laterality: N/A;  . RADIOLOGY WITH ANESTHESIA N/A 03/15/2015   Procedure: MRI OF BRAIN WITHOUT CONTRAST;  Surgeon: Medication Radiologist, MD;  Location: [facility];  Service: Radiology;  Laterality: N/A;  DR. TAT/MRI  . US ECHOCARDIOGRAPHY  04-26-2009   EF 65-70%  . VESICOVAGINAL FISTULA CLOSURE W/ TAH  25 yrs ago    There were no vitals filed for this visit.  Subjective Assessment - 01/30/17 1708    Subjective  Pt reports no problems or changes since last PT session     Pertinent History  h/o old R CVA in 2010; TIA 04-23-09 - 04-27-09:  CAD:  COPD:  chronic edema in L foot dated back to age 52: saccular aneurysm, paroxysmal atrial fibrillation, known cerebral aneurysms by MRA in 2011; ventricular hypertrophy:  HTN; gall bladder surgery Feb. 12, 2018    Patient Stated Goals  increase LLE strength and walk better, and increase arm strength    Currently in Pain?  No/denies                      [facility] Adult PT Treatment/Exercise - 01/30/17 0001      Transfers   Transfers  --      Ambulation/Gait   Ambulation/Gait  Yes    Ambulation/Gait Assistance  5: Supervision    Ambulation Distance (Feet)  200 Feet    Assistive device  Straight cane    Gait Pattern  Decreased dorsiflexion - left;Poor foot clearance - left;Decreased step length - left;Decreased hip/knee flexion - left;Left foot flat    Ambulation Surface  Level;Indoor      Knee/Hip Exercises: Stretches   Press photographer  Left;1 rep;30 seconds      Knee/Hip Exercises: Aerobic   Recumbent Bike  SciFit level 1.5 x 5" with UE & LE's      Knee/Hip Exercises: Machines for Strengthening   Cybex Leg Press  Leg press bil. LE's 40# 15 reps;  LLE only 25# 15 reps      Knee/Hip Exercises: Standing   Heel Raises  Both;1 set;10 reps LLE only 10 reps    Forward Step Up  Left;1 set;10 reps;Step Height: 6"    Step Down  Left;1 set;10 reps;Step Height: 6"       Standing on Bosu inside // bars - weight shifts anterior/posteriorly 10 reps and laterally 10 reps  Pt performed stepping over and back of balance beam 10 reps with each foot with UE support prn      PT Short Term Goals - 12/21/16 1143      PT SHORT TERM GOAL #1   Title  Increase gait velocity to >/= 2.90 ft/sec with cane for incr. gait efficiency.      Baseline  12.22 secs = 2.68 ft/sec on 12-20-16    Time  4    Period  Weeks    Status  New    Target Date  01/19/17      PT SHORT TERM GOAL #2   Title  Pt will report at least 25%  improvement in ambulation with incr. ease with L foot clearance.      Time  4    Period  Weeks    Status  New    Target Date  01/19/17  PT SHORT TERM GOAL #3   Title  Incr. amb. distance to at least 525' in 3" walk test.    Baseline  447' in 3" walk test with [facility] - 12-20-16    Time  4    Period  Weeks    Status  New    Target Date  01/19/17      PT SHORT TERM GOAL #5   Title  Independent in HEP for  LLE strengthening exercises.     Time  4    Period  Weeks    Status  New    Target Date  01/19/17        PT Long Term Goals - 12/21/16 1149      PT LONG TERM GOAL #1   Title  Independent in updated HEP for LLE strengthening and stretching     Time  8    Period  Weeks    Status  New    Target Date  02/18/17      PT LONG TERM GOAL #2   Title  Report at least 50% improvement in LLE flexibility and ease of movement     Time  8    Period  Weeks    Status  New    Target Date  02/18/17      PT LONG TERM GOAL #3   Title  Improve TUG score to </= 10 secs to demo improved mobility.     Time  8    Period  Weeks    Status  New    Target Date  02/18/17      PT LONG TERM GOAL #4   Title  Incr. gait velocity to >/= 3.3 ft/sec with cane for incr. gait efficiency.      Baseline  2.68 ft/sec = 12.22 secs with cane    Time  8    Period  Weeks    Status  New    Target Date  02/18/17      PT LONG TERM GOAL #5   Title  Amb. at least 550' in 3" walk test with use of SPC to demo incr. step length and improved endurance.    Time  8    Period  Weeks    Status  New    Target Date  02/18/17            Plan - 01/30/17 1722    Clinical Impression Statement  Pt continues to have decr. SLS on LLE and uses SPC for assistance with amb.:  pt continues to have significant edema in LLE which impacts flexibility    Rehab Potential  Good    PT Frequency  1x / week    PT Duration  8 weeks    PT Treatment/Interventions  ADLs/Self Care Home Management;Therapeutic exercise;Therapeutic  activities;Functional mobility training;Stair training;Gait training;DME Instruction;Balance training;Neuromuscular re-education;Patient/family education;Orthotic Fit/Training;Passive range of motion    PT Next Visit Plan  check STG's; cont LLE strengthening and balance    Consulted and Agree with Plan of Care  Patient       Patient will benefit from skilled therapeutic intervention in order to improve the following deficits and impairments:  Abnormal gait, Cardiopulmonary status limiting activity, Decreased activity tolerance, Decreased balance, Decreased mobility, Decreased strength, Decreased knowledge of use of DME, Decreased endurance, Decreased coordination, Decreased range of motion, Increased edema, Impaired flexibility  Visit Diagnosis: Other abnormalities of gait and mobility  Muscle weakness (generalized)     Problem List Patient Active  Problem List   Diagnosis Date Noted  . Acute cholecystitis 03/07/2016  . 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 Aneurysm, cerebral, nonruptured 05/31/2015  . Dizziness and giddiness 05/31/2015  . Altered mental status   . Acute CVA (cerebrovascular accident) ([place]) 03/15/2015  . Dysphasia 03/12/2015  . Thrombocytopenia ([place]) 03/12/2015  . Hypokalemia   . Endotracheally intubated   . Essential hypertension   . Intracranial aneurysm 11/18/2014  . Respiratory failure ([place])   . Cerebral aneurysm   . Brain aneurysm   . Aphasia   . Palmar erythema   . CVA (cerebral infarction) 05/02/2014  . Aneurysm ([place]) 05/02/2014  . Expressive aphasia 05/02/2014  . 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 Tobacco abuse 10/24/2010  . TIA (transient ischemic attack)   . Hypertension   . Ventricular hypertrophy   . CVA 02/23/2010  . STRESS FRACTURE, FOOT 02/22/2010    Alda Lea, PT 01/30/2017, 5:41 PM  [facility] [address]  [place], [place], [address] Phone: [phone]   Fax:  [phone]  Name: Jeanne Lawson MRN: [id number] Date of Birth: [birth year]

## 2017-02-05 ENCOUNTER — Ambulatory Visit: Payer: Medicare Other | Admitting: Physical Therapy

## 2017-02-13 ENCOUNTER — Other Ambulatory Visit (HOSPITAL_COMMUNITY): Payer: Self-pay | Admitting: Interventional Radiology

## 2017-02-13 DIAGNOSIS — I729 Aneurysm of unspecified site: Secondary | ICD-10-CM

## 2017-02-15 ENCOUNTER — Ambulatory Visit: Payer: Medicare Other | Admitting: Physical Therapy

## 2017-02-19 ENCOUNTER — Ambulatory Visit: Payer: Medicare Other | Admitting: Physical Therapy

## 2017-02-19 DIAGNOSIS — R2689 Other abnormalities of gait and mobility: Secondary | ICD-10-CM

## 2017-02-19 DIAGNOSIS — M6281 Muscle weakness (generalized): Secondary | ICD-10-CM | POA: Diagnosis not present

## 2017-02-20 ENCOUNTER — Encounter: Payer: Self-pay | Admitting: Physical Therapy

## 2017-02-20 NOTE — Therapy (Signed)
[facility] [address] [place], [place], [address] Phone: [phone]   Fax:  [phone]  Physical Therapy Treatment  Patient Details  Name: Jeanne Lawson MRN: [id number] Date of Birth: 29-Mar-1945 Referring Provider: Dr. [name]   Encounter Date: 02/19/2017  PT End of Session - 02/20/17 1720    Visit Number  5    Number of Visits  9    Date for PT Re-Evaluation  02/18/17    Authorization Type  [facility] Medicare    Authorization Time Period  02-19-17 - 04-19-17       Past Medical History:  Diagnosis Date  . COPD (chronic obstructive pulmonary disease) ([place])   . Coronary artery disease 1996   Known with prior mild lesion of LAD demonstrated by Cardiac Catheterization in 1996  . Edema of foot    She has a history of chronic edema of the left dated back to age 36 when she suufered severe frostbite playing  on the snow as a child  . Hypertension   . PAC (premature atrial contraction)   . Pancreatitis    x2  . PONV (postoperative nausea and vomiting)    problems waking up last time 4/16 only time  . 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  Marland Kitchen Shortness of breath dyspnea    since stroke 2 months ago -  . 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 TIA (transient ischemic attack)    She was hospitalized 04-23-09 through 04-27-09 for involving right side of the body  . Tobacco abuse    Ongoing   . Ventricular hypertrophy 04/2009   LVH with diastolic dysfunction by echo. Has normal EF.    Past Surgical History:  Procedure Laterality Date  . ABDOMINAL HYSTERECTOMY    . APPENDECTOMY    . CARDIAC CATHETERIZATION  1996   Mild CAD with vasospasm  . CARDIOVASCULAR STRESS TEST  12-02-2001   EF 70%  . CHOLECYSTECTOMY N/A 03/09/2016   Procedure: LAPAROSCOPIC CHOLECYSTECTOMY WITH INTRAOPERATIVE CHOLANGIOGRAM;  Surgeon: Georganna Skeans, MD;  Location: [place];  Service: General;  Laterality: N/A;  . ENDARTERECTOMY Right 08/12/2014   Procedure: RIGHT  COMMON CAROTID ARTERY EXPOSURE FOR INTERVENTIONAL RADIOLOGY PROCEDURE BY DR.DEVASHWAR,Insertion 6 FR sheath;  Surgeon: Elam Dutch, MD;  Location: [facility];  Service: Vascular;  Laterality: Right;  . ENDARTERECTOMY Right 08/12/2014   Procedure:  CAROTID  EXPOSURE CLOSURE RIGHT NECK, REPAIR RIGHT COMMON CAROTID ARTERY;  Surgeon: Elam Dutch, MD;  Location: [facility];  Service: Vascular;  Laterality: Right;  . ENDARTERECTOMY Left 11/18/2014   Procedure: CAROTID EXPOSURE;  Surgeon: Elam Dutch, MD;  Location: [facility] [facility];  Service: Vascular;  Laterality: Left;  . ENDARTERECTOMY Left 11/18/2014   Procedure: CLOSURE CAROTID;  Surgeon: Elam Dutch, MD;  Location: [facility];  Service: Vascular;  Laterality: Left;  . IR RADIOLOGIST EVAL & MGMT  06/26/2016  . LAPAROSCOPY    . NECK SURGERY  50 yrs ago   Left side tumor  . RADIOLOGY WITH ANESTHESIA N/A 05/25/2014   Procedure: RADIOLOGY WITH ANESTHESIA;  Surgeon: [name], MD;  Location: [facility];  Service: Radiology;  Laterality: N/A;  . RADIOLOGY WITH ANESTHESIA N/A 08/12/2014   Procedure: RADIOLOGY WITH ANESTHESIA;  Surgeon: Willaim Rayas  [name], MD;  Location: [facility] [place];  Service: Radiology;  Laterality: N/A;  . RADIOLOGY WITH ANESTHESIA N/A 03/15/2015   Procedure: MRI OF BRAIN WITHOUT CONTRAST;  Surgeon: Medication Radiologist, MD;  Location: [facility];  Service: Radiology;  Laterality: N/A;  DR. TAT/MRI  . US ECHOCARDIOGRAPHY  04-26-2009   EF 65-70%  . VESICOVAGINAL FISTULA CLOSURE W/ TAH  25 yrs ago    There were no vitals filed for this visit.  Subjective Assessment - 02/20/17 1710    Subjective  Pt reports no changes since PT session a few weeks ago - had to cancel last week due to her car being in the shop     Pertinent History  h/o old R  CVA in 2010; TIA 04-23-09 - 04-27-09:  CAD:  COPD:  chronic edema in L foot dated back to age 6: saccular aneurysm, paroxysmal atrial fibrillation, known cerebral aneurysms by MRA in 2011; ventricular hypertrophy:  HTN; gall bladder surgery Feb. 12, 2018    Diagnostic tests  MRI on 03-17-15; previous MRI's due to previous CVA    Patient Stated Goals  increase LLE strength and walk better, and increase arm strength    Currently in Pain?  No/denies                      [facility] Adult PT Treatment/Exercise - 02/20/17 0001      Ambulation/Gait   Ambulation/Gait  Yes    Ambulation/Gait Assistance  5: Supervision    Ambulation Distance (Feet)  115 Feet    Assistive device  Straight cane    Gait Pattern  Decreased dorsiflexion - left;Poor foot clearance - left;Decreased step length - left;Decreased hip/knee flexion - left;Left foot flat    Ambulation Surface  Level;Indoor      Knee/Hip Exercises: Clinical research associate  Left;1 rep;30 seconds      Knee/Hip Exercises: Standing   Heel Raises  Both;1 set;10 reps LLE only 10 reps    Forward Step Up  Left;1 set;10 reps;Step Height: 6"    Other Standing Knee Exercises  Pt performed hip abduction and extension LLE with 2# x 10 reps          Balance Exercises - 02/20/17 1715      Balance Exercises: Standing   Sidestepping  2 reps with squats - inside // bars    Other Standing Exercises  Marching in place 10 reps inside // bars with cues to lift LLE high:  amb. on tip toes forward/back inside // bars 2 reps          PT Short Term Goals - 02/20/17 1727      PT SHORT TERM GOAL #1   Title  Increase gait velocity to >/= 2.90 ft/sec with cane for incr. gait efficiency.      Baseline  12.22 secs = 2.68 ft/sec on 12-20-16    Time  4    Period  Weeks    Status  On-going    Target Date  02/19/17      PT SHORT TERM GOAL #2   Title  Pt will report at least 25% improvement in ambulation with incr. ease with L foot clearance.       Baseline  fluctuates based on edema -- 02-19-17    Time  4    Period  Weeks    Status  On-going      PT SHORT TERM GOAL #3   Title  Incr. amb. distance to at  least 525' in 3" walk test.    Baseline  447' in 3" walk test with [facility] - 12-20-16    Time  4    Period  Weeks    Status  On-going      PT SHORT TERM GOAL #5   Title  Independent in HEP for  LLE strengthening exercises.     Baseline  met 02-19-17    Time  4    Period  Weeks    Status  Achieved        PT Long Term Goals - 02/20/17 1726      PT LONG TERM GOAL #1   Title  Independent in updated HEP for LLE strengthening and stretching     Time  4    Period  Weeks    Status  On-going      PT LONG TERM GOAL #2   Title  Report at least 50% improvement in LLE flexibility and ease of movement     Time  4    Period  Weeks    Status  On-going      PT LONG TERM GOAL #3   Title  Improve TUG score to </= 10 secs to demo improved mobility.     Time  4    Period  Weeks    Status  On-going      PT LONG TERM GOAL #4   Title  Incr. gait velocity to >/= 3.3 ft/sec with cane for incr. gait efficiency.      Time  4    Period  Weeks    Status  On-going      PT LONG TERM GOAL #5   Title  Amb. at least 550' in 3" walk test with use of SPC to demo incr. step length and improved endurance.    Baseline  (200' x 3 = 600') nonstop with cane = met 11-02-15; 730' with cane on 12-27-15    Time  4    Period  Weeks    Status  On-going      PT LONG TERM GOAL #7   Title  Perform SLS on LLE >/= 5 secs to demo improved balance.  (12-03-15)/ 01-27-16    Baseline  3.47 secs; 3.66 secs on 12-27-15    Time  4    Period  Weeks    Status  On-going      PT LONG TERM GOAL #8   Title  Transfer floor to stand with UE support with SBA/verbal cues.  (12-03-15)/ 01-27-16    Time  4    Period  Weeks    Status  On-going              Patient will benefit from skilled therapeutic intervention in order to improve the following deficits and  impairments:     Visit Diagnosis: Other abnormalities of gait and mobility - Plan: PT plan of care cert/re-cert  Muscle weakness (generalized) - Plan: PT plan of care cert/re-cert     Problem List Patient Active Problem List   Diagnosis Date Noted  . Acute cholecystitis 03/07/2016  . 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 Aneurysm, cerebral, nonruptured 05/31/2015  . Dizziness and giddiness 05/31/2015  . Altered mental status   . Acute CVA (cerebrovascular accident) ([place]) 03/15/2015  . Dysphasia 03/12/2015  . Thrombocytopenia ([place]) 03/12/2015  . Hypokalemia   . Endotracheally intubated   . Essential hypertension   . Intracranial aneurysm 11/18/2014  . Respiratory failure ([place])   .  Cerebral aneurysm   . Brain aneurysm   . Aphasia   . Palmar erythema   . CVA (cerebral infarction) 05/02/2014  . Aneurysm ([place]) 05/02/2014  . Expressive aphasia 05/02/2014  . 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 Tobacco abuse 10/24/2010  . TIA (transient ischemic attack)   . Hypertension   . Ventricular hypertrophy   . CVA 02/23/2010  . STRESS FRACTURE, FOOT 02/22/2010    Kingston Guiles, [place], PT 02/20/2017, 7:22 PM  [facility] [address] [place], [place], [address] Phone: [phone]   Fax:  [phone]  Name: Jeanne Lawson MRN: [id number] Date of Birth: 05-Jan-1946

## 2017-02-26 ENCOUNTER — Ambulatory Visit (INDEPENDENT_AMBULATORY_CARE_PROVIDER_SITE_OTHER): Payer: Medicare Other | Admitting: Cardiology

## 2017-02-26 ENCOUNTER — Encounter: Payer: Self-pay | Admitting: Cardiology

## 2017-02-26 DIAGNOSIS — E782 Mixed hyperlipidemia: Secondary | ICD-10-CM | POA: Diagnosis not present

## 2017-02-26 DIAGNOSIS — I2510 Atherosclerotic heart disease of native coronary artery without angina pectoris: Secondary | ICD-10-CM | POA: Diagnosis not present

## 2017-02-26 DIAGNOSIS — I119 Hypertensive heart disease without heart failure: Secondary | ICD-10-CM

## 2017-02-26 MED ORDER — TOPROL XL 50 MG PO TB24: ORAL_TABLET | ORAL | 3 refills | Status: DC

## 2017-02-26 MED ORDER — LISINOPRIL-HYDROCHLOROTHIAZIDE 20-12.5 MG PO TABS: 1.0000 | ORAL_TABLET | Freq: Every day | ORAL | 11 refills | Status: DC

## 2017-02-26 NOTE — Patient Instructions (Signed)

## 2017-02-26 NOTE — Progress Notes (Signed)
Cardiology Office Note    Date:  02/26/2017   ID:  Jeanne Lawson, Jeanne Lawson 07/28/1945, MRN [id number]  PCP:  [name], MD  Cardiologist:   Ena Dawley, MD   Chief complain: 6 months follow-up, new findings of intracranial bleeding  History of Present Illness:  Jeanne Lawson is a 72 y.o. female previously followed by Dr. Mare Ferrari.  She has a past history of multiple strokes. She's had problems with high blood pressure in the past. She has a history of prior coronary artery disease and 16 years ago Dr. Ilda Foil did cardiac catheterization and angioplasty but no stent. We don't have those records. The patient has had several subsequent Cardiolite stress tests which have been normal but have been poorly tolerated by the patient and she does not want any further nuclear stress tests. She does have a chronically abnormal EKG with anterolateral T wave inversions. She denies any symptoms of angina or dyspnea since her last OV. Unfortunately, she has had recurrent strokes. Her last CVA was in April 2016. She was also found to have enlarging aneurysms of the posterior circulation That was stented by Dr. [name]. This is followed by Dr. [name] and Dr. Oneida Alar.   06/30/2016 - this is her 6 months follow-up, The patient has been stable from cardiac standpoint and remains active with no chest pain or shortness of breath however she stressed out because on her most recent CT head that showed intracranial bleeding, and Dr. [name] wanted her to follow with [place] to see if it would be safe to discontinue Plavix. However I cannot find CT that describes the bleeding to most recent CT angiography of the neck and brain on 06/05/2016  showed:  No change in the appearance of the right supraclinoid ICA/ P com aneurysm with maximal diameter of 8 mm. Persistent flow through the pipeline stent. Interval thrombosis of the left A1 aneurysm and proximal A1 vessel. Left anterior cerebral artery receives it is flow  through a patent anterior communicating artery. Patent pipeline stent. 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  She denies any recent strokelike symptoms, no dizziness lightheadedness no new weakness no falls. No blurry vision. She states that Plavix irritates her stomach as well and she would like to stop taking it.  02/26/17 - 6 months follow up, she feels well, she continues to walk daily, she also goes to rehabilitation at [facility] and denies any chest pain, shortness of breath, palpitation, dizziness or syncope. She is tolerating all her medications. She is chronic lower extremity edema since she was child, because of her strong and decreased strength in the left hand she is unable to put compression socks on. She's been checking her blood pressure and is always at goal.  Past Medical History:  Diagnosis Date  . COPD (chronic obstructive pulmonary disease) ([place])   . Coronary artery disease 1996   Known with prior mild lesion of LAD demonstrated by Cardiac Catheterization in 1996  . Edema of foot    She has a history of chronic edema of the left dated back to age 37 when she suufered severe frostbite playing  on the snow as a child  . Hypertension   . PAC (premature atrial contraction)   . Pancreatitis    x2  . PONV (postoperative nausea and vomiting)    problems waking up last time 4/16 only time  . Saccular aneurysm  She also has 2 known which were stable between the MRA of October2010 and the MRA  of April 2011.  Marland Kitchen Shortness of breath dyspnea    since stroke 2 months ago -  . 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 TIA (transient ischemic attack)    She was hospitalized 04-23-09 through 04-27-09 for involving right side of the body  . Tobacco abuse    Ongoing   . Ventricular hypertrophy  04/2009   LVH with diastolic dysfunction by echo. Has normal EF.    Past Surgical History:  Procedure Laterality Date  . ABDOMINAL HYSTERECTOMY    . APPENDECTOMY    . CARDIAC CATHETERIZATION  1996   Mild CAD with vasospasm  . CARDIOVASCULAR STRESS TEST  12-02-2001   EF 70%  . CHOLECYSTECTOMY N/A 03/09/2016   Procedure: LAPAROSCOPIC CHOLECYSTECTOMY WITH INTRAOPERATIVE CHOLANGIOGRAM;  Surgeon: Georganna Skeans, MD;  Location: [facility];  Service: General;  Laterality: N/A;  . ENDARTERECTOMY Right 08/12/2014   Procedure: RIGHT  COMMON CAROTID ARTERY EXPOSURE FOR INTERVENTIONAL RADIOLOGY PROCEDURE BY DR.DEVASHWAR,Insertion 6 FR sheath;  Surgeon: Elam Dutch, MD;  Location: [facility];  Service: Vascular;  Laterality: Right;  . ENDARTERECTOMY Right 08/12/2014   Procedure:  CAROTID  EXPOSURE CLOSURE RIGHT NECK, REPAIR RIGHT COMMON CAROTID ARTERY;  Surgeon: Elam Dutch, MD;  Location: [place];  Service: Vascular;  Laterality: Right;  . ENDARTERECTOMY Left 11/18/2014   Procedure: CAROTID EXPOSURE;  Surgeon: Elam Dutch, MD;  Location: [facility];  Service: Vascular;  Laterality: Left;  . ENDARTERECTOMY Left 11/18/2014   Procedure: CLOSURE CAROTID;  Surgeon: Elam Dutch, MD;  Location: [facility];  Service: Vascular;  Laterality: Left;  . IR RADIOLOGIST EVAL & MGMT  06/26/2016  . LAPAROSCOPY    . NECK SURGERY  50 yrs ago   Left side tumor  . RADIOLOGY WITH ANESTHESIA N/A 05/25/2014   Procedure: RADIOLOGY WITH ANESTHESIA;  Surgeon: [name], MD;  Location: [facility];  Service: Radiology;  Laterality: N/A;  . RADIOLOGY WITH ANESTHESIA N/A 08/12/2014   Procedure: RADIOLOGY WITH ANESTHESIA;  Surgeon: [name], MD;  Location: [facility];  Service: Radiology;  Laterality: N/A;  . RADIOLOGY WITH ANESTHESIA N/A 03/15/2015   Procedure: MRI OF BRAIN WITHOUT CONTRAST;  Surgeon: Medication Radiologist, MD;  Location: [facility];  Service: Radiology;  Laterality: N/A;  DR. TAT/MRI  . US ECHOCARDIOGRAPHY  04-26-2009     EF 65-70%  . VESICOVAGINAL FISTULA CLOSURE W/ TAH  25 yrs ago    Current Medications: Outpatient Medications Prior to Visit  Medication Sig Dispense Refill  . albuterol (PROVENTIL HFA;VENTOLIN HFA) 108 (90 BASE) MCG/ACT inhaler Inhale 1-2 puffs into the lungs every 6 (six) hours as needed for wheezing or shortness of breath.    Marland Kitchen aspirin EC 81 MG tablet Take 81 mg by mouth daily at 12 noon.    Marland Kitchen lisinopril-hydrochlorothiazide (PRINZIDE,ZESTORETIC) 20-12.5 MG tablet TAKE 1 TABLET BY MOUTH DAILY. 30 tablet 7  . TOPROL XL 50 MG 24 hr tablet Take 1/2 tablet by mouth twice daily.  Take with or immediately following a meal. 90 tablet 2   No facility-administered medications prior to visit.      Allergies:   Dilaudid [hydromorphone]; Codeine; Latex; Lidocaine; and Sulfa drugs cross reactors   Social History   Socioeconomic History  . Marital status: Widowed    Spouse name:  None  . Number of children: None  . Years of education: None  . Highest education level: None  Social Needs  . Financial resource strain: None  . Food insecurity - worry: None  . Food insecurity - inability: None  . Transportation needs - medical: None  . Transportation needs - non-medical: None  Occupational History  . None  Tobacco Use  . Smoking status: Former Smoker    Packs/day: 1.00    Years: 30.00    Pack years: 30.00    Types: Cigarettes    Last attempt to quit: 04/26/2014    Years since quitting: 2.8  . Smokeless tobacco: Never Used  Substance and Sexual Activity  . Alcohol use: No  . Drug use: No  . Sexual activity: None  Other Topics Concern  . None  Social History Narrative  . None     Family History:  The patient's family history includes Aneurysm in her sister; Diabetes in her father; Emphysema in her father; Heart disease in her mother; Hypertension in her daughter and mother; Stroke in her mother; Thyroid disease in her daughter.   ROS:   Please see the history of present illness.     ROS All other systems reviewed and are negative.   PHYSICAL EXAM:   VS:  BP (!) 140/96   Pulse (!) 50   Ht 5\' 1"  (1.549 m)   Wt 170 lb 3.2 oz (77.2 kg)   BMI 32.16 kg/m    GEN: Well nourished, well developed, in no acute distress  HEENT: normal  Neck: no JVD, carotid bruits, or masses Cardiac: RRR; no murmurs, rubs, or gallops,no edema  Respiratory:  clear to auscultation bilaterally, normal work of breathing GI: soft, nontender, nondistended, + BS MS: no deformity or atrophy  Skin: warm and dry, no rash Neuro:  Alert and Oriented x 3, Strength and sensation are intact Psych: euthymic mood, full affect  Wt Readings from Last 3 Encounters:  02/26/17 170 lb 3.2 oz (77.2 kg)  11/27/16 169 lb (76.7 kg)  06/30/16 160 lb (72.6 kg)    Studies/Labs Reviewed:   EKG:  EKG is ordered today. 02/26/2017 and shows sinus bradycardia with sinus arrhythmia, unchanged from prior.  Recent Labs: 03/08/2016: TSH 2.332 03/09/2016: ALT 10; BUN 9; Hemoglobin 9.1; Platelets 107; Potassium 3.5; Sodium 137 06/05/2016: Creatinine, Ser 0.80   Lipid Panel    Component Value Date/Time   CHOL 147 11/27/2016 1019   TRIG 129 11/27/2016 1019   HDL 65 11/27/2016 1019   CHOLHDL 2.3 11/27/2016 1019   CHOLHDL 2.1 03/08/2016 0447   VLDL 14 03/08/2016 0447   LDLCALC 56 11/27/2016 1019   02/2015  - Left ventricle: The cavity size was normal. Wall thickness was   increased in a pattern of moderate LVH. Systolic function was   normal. The estimated ejection fraction was in the range of 60%   to 65%. Wall motion was normal; there were no regional wall   motion abnormalities. Doppler parameters are consistent with   abnormal left ventricular relaxation (grade 1 diastolic   dysfunction).    ASSESSMENT:    1. Coronary artery disease involving native coronary artery of native heart without angina pectoris   2. Mixed hyperlipidemia   3. Hypertensive heart disease without CHF      PLAN:  In order of  problems listed above:  1. CAD - continue aspirin, lisinopril and metoprolol..  2. Hypertension  - blood pressure went controlled rechecked 135/85, always controlled at  home. No new strokelike symptoms.  3. Lower extremity edema, on low-dose hydrocortisone, she doesn't want to change it as this is functioning for her. 4. H/O aneurysms and bleeding - followed by Dr.Deveshwar - he follows her for her brain aneurysms is I cannot find imaging that would support intracranial bleed. However it's perfectly fine from cardiac standpoint to stop Plavix and continue aspirin only. She was started on Plavix by neurologist after she had her ischemic strokes. They should decide if they would prefer Plavix over aspirin. Another good reason to stop Plavix is that she is intolerant to it and has significant stomach pain. However from cardiac standpoint either aspirin 81 mg daily or Plavix 75 mg daily would be acceptable.   Medication Adjustments/Labs and Tests Ordered: Current medicines are reviewed at length with the patient today.  Concerns regarding medicines are outlined above.  Medication changes, Labs and Tests ordered today are listed in the Patient Instructions below. Patient Instructions  Medication Instructions:   Your physician recommends that you continue on your current medications as directed. Please refer to the Current Medication list given to you today.    Follow-Up:  Your physician wants you to follow-up in: [place] will receive a reminder letter in the mail two months in advance. If you don't receive a letter, please call our office to schedule the follow-up appointment.        If you need a refill on your cardiac medications before your next appointment, please call your pharmacy.      Signed, Ena Dawley, MD  02/26/2017 10:38 AM    [facility] [address], [place], [place]  [address] Phone: [phone]; Fax: [phone]

## 2017-02-27 ENCOUNTER — Ambulatory Visit: Payer: Medicare Other | Attending: Internal Medicine | Admitting: Physical Therapy

## 2017-02-27 DIAGNOSIS — R2681 Unsteadiness on feet: Secondary | ICD-10-CM | POA: Insufficient documentation

## 2017-02-27 DIAGNOSIS — R2689 Other abnormalities of gait and mobility: Secondary | ICD-10-CM | POA: Diagnosis present

## 2017-02-27 DIAGNOSIS — M6281 Muscle weakness (generalized): Secondary | ICD-10-CM | POA: Diagnosis present

## 2017-02-28 ENCOUNTER — Encounter: Payer: Self-pay | Admitting: Physical Therapy

## 2017-02-28 NOTE — Therapy (Signed)
[facility] [address] [place], [place], [address] Phone: [phone]   Fax:  [phone]  Physical Therapy Treatment  Patient Details  Name: ARMETTA HENRI MRN: [id number] Date of Birth: Oct 16, 1945 Referring Provider: Dr. [name]   Encounter Date: 02/27/2017  PT End of Session - 02/28/17 1358    Visit Number  6    Date for PT Re-Evaluation  03/16/17    Authorization Type  UHC Medicare    Authorization Time Period  02-19-17 - 04-19-17    PT Start Time  1018    PT Stop Time  1102    PT Time Calculation (min)  44 min       Past Medical History:  Diagnosis Date  . COPD (chronic obstructive pulmonary disease) ([place])   . Coronary artery disease 1996   Known with prior mild lesion of LAD demonstrated by Cardiac Catheterization in 1996  . Edema of foot    She has a history of chronic edema of the left dated back to age 72 when she suufered severe frostbite playing  on the snow as a child  . Hypertension   . PAC (premature atrial contraction)   . Pancreatitis    x2  . PONV (postoperative nausea and vomiting)    problems waking up last time 4/16 only time  . 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  Marland Kitchen Shortness of breath dyspnea    since stroke 2 months ago -  . 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 TIA (transient ischemic attack)    She was hospitalized 04-23-09 through 04-27-09 for involving right side of the body  . Tobacco abuse    Ongoing   . Ventricular hypertrophy 04/2009   LVH with diastolic dysfunction by echo. Has normal EF.    Past Surgical History:  Procedure Laterality Date  . ABDOMINAL HYSTERECTOMY    . APPENDECTOMY    . CARDIAC CATHETERIZATION  1996   Mild CAD with vasospasm  . CARDIOVASCULAR STRESS TEST   12-02-2001   EF 70%  . CHOLECYSTECTOMY N/A 03/09/2016   Procedure: LAPAROSCOPIC CHOLECYSTECTOMY WITH INTRAOPERATIVE CHOLANGIOGRAM;  Surgeon: Georganna Skeans, MD;  Location: [facility];  Service: General;  Laterality: N/A;  . ENDARTERECTOMY Right 08/12/2014   Procedure: RIGHT  COMMON CAROTID ARTERY EXPOSURE FOR INTERVENTIONAL RADIOLOGY PROCEDURE BY DR.DEVASHWAR,Insertion 6 FR sheath;  Surgeon: Elam Dutch, MD;  Location: [facility];  Service: Vascular;  Laterality: Right;  . ENDARTERECTOMY Right 08/12/2014   Procedure:  CAROTID  EXPOSURE CLOSURE RIGHT NECK, REPAIR RIGHT COMMON CAROTID ARTERY;  Surgeon: Elam Dutch, MD;  Location: [place];  Service: Vascular;  Laterality: Right;  . ENDARTERECTOMY Left 11/18/2014   Procedure: CAROTID EXPOSURE;  Surgeon: Elam Dutch, MD;  Location: [facility];  Service: Vascular;  Laterality: Left;  . ENDARTERECTOMY Left 11/18/2014   Procedure: CLOSURE CAROTID;  Surgeon: Elam Dutch, MD;  Location: [facility];  Service: Vascular;  Laterality: Left;  . IR RADIOLOGIST EVAL & MGMT  06/26/2016  . LAPAROSCOPY    . NECK SURGERY  50 yrs ago   Left side tumor  . RADIOLOGY WITH ANESTHESIA N/A 05/25/2014   Procedure: RADIOLOGY WITH ANESTHESIA;  Surgeon: [name], MD;  Location: [facility];  Service: Radiology;  Laterality: N/A;  . RADIOLOGY WITH ANESTHESIA N/A 08/12/2014   Procedure: RADIOLOGY WITH ANESTHESIA;  Surgeon: [name], MD;  Location: [facility];  Service: Radiology;  Laterality: N/A;  . RADIOLOGY WITH ANESTHESIA N/A 03/15/2015   Procedure: MRI OF BRAIN WITHOUT CONTRAST;  Surgeon: Medication Radiologist, MD;  Location: [facility];  Service: Radiology;  Laterality: N/A;  DR. TAT/MRI  . US ECHOCARDIOGRAPHY  04-26-2009   EF 65-70%  . VESICOVAGINAL FISTULA CLOSURE W/ TAH  25 yrs ago    There were no vitals filed for this visit.  Subjective Assessment - 02/28/17 1346    Subjective  Pt reports she is doing well - states she has 2 more PT appts to make up for the ones missed  due to weather    Pertinent History  h/o old R CVA in 2010; TIA 04-23-09 - 04-27-09:  CAD:  COPD:  chronic edema in L foot dated back to age 72: saccular aneurysm, paroxysmal atrial fibrillation, known cerebral aneurysms by MRA in 2011; ventricular hypertrophy:  HTN; gall bladder surgery Feb. 12, 2018    Diagnostic tests  MRI on 03-17-15; previous MRI's due to previous CVA    Patient Stated Goals  increase LLE strength and walk better, and increase arm strength    Currently in Pain?  No/denies                      [facility] Adult PT Treatment/Exercise - 02/28/17 0001      Ambulation/Gait   Ambulation/Gait  Yes    Ambulation/Gait Assistance  5: Supervision    Ambulation/Gait Assistance Details  3" walk test with [facility]     Ambulation Distance (Feet)  454 Feet    Assistive device  Straight cane    Gait Pattern  Decreased dorsiflexion - left;Poor foot clearance - left;Decreased step length - left;Decreased hip/knee flexion - left;Left foot flat    Ambulation Surface  Level;Indoor      Standardized Balance Assessment   Standardized Balance Assessment  Timed Up and Go Test      Timed Up and Go Test   TUG  Normal TUG    Normal TUG (seconds)  10.16 with SPC      Knee/Hip Exercises: Machines for Strengthening   Cybex Leg Press  LLE only 30# 15 reps 1 set       Knee/Hip Exercises: Standing   Heel Raises  Both;1 set;10 reps LLE only 10 reps    Forward Step Up  Left;1 set;10 reps;Step Height: 6"    Other Standing Knee Exercises  Pt performed hip abduction, flexion and extension LLE with 3# x 10 reps      Pt stood on LLE 2.4 secs - 1 rep only;  Other trials approx. 1.3 secs         PT Short Term Goals - 02/28/17 1405      PT SHORT TERM GOAL #1   Title  Increase gait velocity to >/= 2.90 ft/sec with cane for incr. gait efficiency.      Status  On-going      PT SHORT TERM GOAL #2   Title  Pt will report at least 25% improvement in ambulation with incr. ease with L foot  clearance.      Baseline  fluctuates based on edema -- 02-19-17    Status  On-going      PT SHORT TERM GOAL #3   Title  Incr. amb. distance to at least 525' in 3" walk test.  Status  Not Met      PT SHORT TERM GOAL #4   Title  Trial AFO for LLE for incr. safety with ambulation.  (04-05-15)    Status  Deferred      PT SHORT TERM GOAL #5   Title  Independent in HEP for  LLE strengthening exercises.     Status  Achieved        PT Long Term Goals - 02/28/17 1358      PT LONG TERM GOAL #1   Title  Independent in updated HEP for LLE strengthening and stretching     Baseline  met 02-27-17    Status  Achieved      PT LONG TERM GOAL #2   Title  Report at least 50% improvement in LLE flexibility and ease of movement     Baseline  remains inconsistently met -  02-27-17    Status  Partially Met      PT LONG TERM GOAL #3   Title  Improve TUG score to </= 10 secs to demo improved mobility.     Baseline  10.16 secs with SPC on 02-28-17    Status  Partially Met      PT LONG TERM GOAL #4   Title  Incr. gait velocity to >/= 3.3 ft/sec with cane for incr. gait efficiency.      Baseline  2.68 ft/sec = 12.22 secs with cane    Status  On-going      PT LONG TERM GOAL #5   Title  Amb. at least 550' in 3" walk test with use of SPC to demo incr. step length and improved endurance.    Baseline  454' with SPC in 3" walk test on 02-27-17    Status  On-going      PT LONG TERM GOAL #7   Title  Perform SLS on LLE >/= 5 secs to demo improved balance.  (12-03-15)/ 01-27-16    Baseline  3.47 secs; 3.66 secs on 12-27-15; 2.4 secs on LLE on 02-28-17    Status  On-going      PT LONG TERM GOAL #8   Title  Transfer floor to stand with UE support with SBA/verbal cues.  (12-03-15)/ 01-27-16    Status  On-going            Plan - 02/28/17 1402    Clinical Impression Statement  Pt has met LTG #1:  #2 partially met with ease of LLE movement continuing to fluctuate depending on edema and fatigue level:  #3  partially met with score 10.16 secs:  Goals 4 and 6 not tested at today's session and goals 5 and 6 not met with plan to re-assess these goals at D/C    Rehab Potential  Good    PT Frequency  1x / week    PT Duration  8 weeks    PT Treatment/Interventions  ADLs/Self Care Home Management;Therapeutic exercise;Therapeutic activities;Functional mobility training;Stair training;Gait training;DME Instruction;Balance training;Neuromuscular re-education;Patient/family education;Orthotic Fit/Training;Passive range of motion    PT Next Visit Plan  cont LLE strengthening and balance    PT Home Exercise Plan  Stretches and strengthening for LLE    Consulted and Agree with Plan of Care  Patient       Patient will benefit from skilled therapeutic intervention in order to improve the following deficits and impairments:  Abnormal gait, Cardiopulmonary status limiting activity, Decreased activity tolerance, Decreased balance, Decreased mobility, Decreased strength, Decreased knowledge of use of DME, Decreased endurance,  Decreased coordination, Decreased range of motion, Increased edema, Impaired flexibility  Visit Diagnosis: Other abnormalities of gait and mobility  Muscle weakness (generalized)  Unsteadiness on feet     Problem List Patient Active Problem List   Diagnosis Date Noted  . Acute cholecystitis 03/07/2016  . 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 Aneurysm, cerebral, nonruptured 05/31/2015  . Dizziness and giddiness 05/31/2015  . Altered mental status   . Acute CVA (cerebrovascular accident) ([place]) 03/15/2015  . Dysphasia 03/12/2015  . Thrombocytopenia ([place]) 03/12/2015  . Hypokalemia   . Endotracheally intubated   . Essential hypertension   . Intracranial aneurysm 11/18/2014  . Respiratory failure ([place])   . Cerebral aneurysm   . Brain aneurysm   . Aphasia   . Palmar erythema   . CVA (cerebral infarction) 05/02/2014  . Aneurysm ([place]) 05/02/2014  . Expressive aphasia  05/02/2014  . 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 Tobacco abuse 10/24/2010  . TIA (transient ischemic attack)   . Hypertension   . Ventricular hypertrophy   . CVA 02/23/2010  . STRESS FRACTURE, FOOT 02/22/2010    Alda Lea, PT 02/28/2017, 2:07 PM  [facility] [address] [place], [place], [address] Phone: [phone]   Fax:  [phone]  Name: MIKEISHA LEMONDS MRN: [id number] Date of Birth: Jun 08, 1945

## 2017-03-05 ENCOUNTER — Ambulatory Visit: Payer: Medicare Other | Admitting: Physical Therapy

## 2017-03-05 ENCOUNTER — Encounter: Payer: Self-pay | Admitting: Physical Therapy

## 2017-03-05 DIAGNOSIS — M6281 Muscle weakness (generalized): Secondary | ICD-10-CM

## 2017-03-05 DIAGNOSIS — R2681 Unsteadiness on feet: Secondary | ICD-10-CM

## 2017-03-05 DIAGNOSIS — R2689 Other abnormalities of gait and mobility: Secondary | ICD-10-CM | POA: Diagnosis not present

## 2017-03-05 NOTE — Therapy (Signed)
[facility] [address] [place], [place], [address] Phone: [phone]   Fax:  [phone]  Physical Therapy Treatment  Patient Details  Name: Jeanne Lawson MRN: [id number] Date of Birth: 09/01/1945 Referring Provider: Dr. [name]   Encounter Date: 03/05/2017  PT End of Session - 03/05/17 2139    Visit Number  7    Number of Visits  9    Date for PT Re-Evaluation  03/16/17    Authorization Type  UHC Medicare    Authorization Time Period  02-19-17 - 04-19-17    PT Start Time  1017    PT Stop Time  1100    PT Time Calculation (min)  43 min       Past Medical History:  Diagnosis Date  . COPD (chronic obstructive pulmonary disease) ([place])   . Coronary artery disease 1996   Known with prior mild lesion of LAD demonstrated by Cardiac Catheterization in 1996  . Edema of foot    She has a history of chronic edema of the left dated back to age 10 when she suufered severe frostbite playing  on the snow as a child  . Hypertension   . PAC (premature atrial contraction)   . Pancreatitis    x2  . PONV (postoperative nausea and vomiting)    problems waking up last time 4/16 only time  . 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  Marland Kitchen Shortness of breath dyspnea    since stroke 2 months ago -  . 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 TIA (transient ischemic attack)    She was hospitalized 04-23-09 through 04-27-09 for involving right side of the body  . Tobacco abuse    Ongoing   . Ventricular hypertrophy 04/2009   LVH with diastolic dysfunction by echo. Has normal EF.    Past Surgical History:  Procedure Laterality Date  . ABDOMINAL HYSTERECTOMY    . APPENDECTOMY    . CARDIAC CATHETERIZATION  1996   Mild CAD with vasospasm  .  CARDIOVASCULAR STRESS TEST  12-02-2001   EF 70%  . CHOLECYSTECTOMY N/A 03/09/2016   Procedure: LAPAROSCOPIC CHOLECYSTECTOMY WITH INTRAOPERATIVE CHOLANGIOGRAM;  Surgeon: Georganna Skeans, MD;  Location: [facility];  Service: General;  Laterality: N/A;  . ENDARTERECTOMY Right 08/12/2014   Procedure: RIGHT  COMMON CAROTID ARTERY EXPOSURE FOR INTERVENTIONAL RADIOLOGY PROCEDURE BY DR.DEVASHWAR,Insertion 6 FR sheath;  Surgeon: Elam Dutch, MD;  Location: [facility];  Service: Vascular;  Laterality: Right;  . ENDARTERECTOMY Right 08/12/2014   Procedure:  CAROTID  EXPOSURE CLOSURE RIGHT NECK, REPAIR RIGHT COMMON CAROTID ARTERY;  Surgeon: Elam Dutch, MD;  Location: [facility];  Service: Vascular;  Laterality: Right;  . ENDARTERECTOMY Left 11/18/2014   Procedure: CAROTID EXPOSURE;  Surgeon: Elam Dutch, MD;  Location: [facility];  Service: Vascular;  Laterality: Left;  . ENDARTERECTOMY Left 11/18/2014   Procedure: CLOSURE CAROTID;  Surgeon: Elam Dutch, MD;  Location: [facility];  Service: Vascular;  Laterality: Left;  . IR RADIOLOGIST EVAL & MGMT  06/26/2016  . LAPAROSCOPY    . NECK SURGERY  50 yrs ago   Left side tumor  . RADIOLOGY WITH ANESTHESIA N/A 05/25/2014   Procedure: RADIOLOGY WITH ANESTHESIA;  Surgeon: [name],  MD;  Location: [facility];  Service: Radiology;  Laterality: N/A;  . RADIOLOGY WITH ANESTHESIA N/A 08/12/2014   Procedure: RADIOLOGY WITH ANESTHESIA;  Surgeon: [name], MD;  Location: [facility];  Service: Radiology;  Laterality: N/A;  . RADIOLOGY WITH ANESTHESIA N/A 03/15/2015   Procedure: MRI OF BRAIN WITHOUT CONTRAST;  Surgeon: Medication Radiologist, MD;  Location[facility];  Service: Radiology;  Laterality: N/A;  DR. TAT/MRI  . US ECHOCARDIOGRAPHY  04-26-2009   EF 65-70%  . VESICOVAGINAL FISTULA CLOSURE W/ TAH  25 yrs ago    There were no vitals filed for this visit.  Subjective Assessment - 03/05/17 2134    Subjective  Pt states that her legs were very sore after session last week     Pertinent History  h/o old R CVA in 2010; TIA 04-23-09 - 04-27-09:  CAD:  COPD:  chronic edema in L foot dated back to age 64: saccular aneurysm, paroxysmal atrial fibrillation, known cerebral aneurysms by MRA in 2011; ventricular hypertrophy:  HTN; gall bladder surgery Feb. 12, 2018    Diagnostic tests  MRI on 03-17-15; previous MRI's due to previous CVA    Patient Stated Goals  increase LLE strength and walk better, and increase arm strength    Currently in Pain?  No/denies                      [facility] Adult PT Treatment/Exercise - 03/05/17 0001      Transfers   Transfers  Sit to Stand    Number of Reps  Other reps (comment) 5 reps on blue Airex pad      Knee/Hip Exercises: Machines for Strengthening   Cybex Leg Press  bil. LE's 35# 15 reps;  LLE only 25# 3 sets 10 reps      Knee/Hip Exercises: Standing   Heel Raises  Both;1 set;10 reps LLE only 10 reps    Forward Step Up  Left;1 set;10 reps;Step Height: 6"    Other Standing Knee Exercises  Pt performed hip abduction, flexion and extension LLE with 3# x 10 reps    Other Standing Knee Exercises  LIfting LLE with 3# weight with knee flexed          Balance Exercises - 03/05/17 2137      Balance Exercises: Standing   Step Ups  Forward;6 inch;UE support 1 LLE 10 reps    Sidestepping  1 rep with squats 15' along countertop    Other Standing Exercises  Lt unilateral plantarfflexion 10 reps; pt performed amb. on tiptoes forward and sideways along countertop 2 reps with CGA          PT Short Term Goals - 02/28/17 1405      PT SHORT TERM GOAL #1   Title  Increase gait velocity to >/= 2.90 ft/sec with cane for incr. gait efficiency.      Status  On-going      PT SHORT TERM GOAL #2   Title  Pt will report at least 25% improvement in ambulation with incr. ease with L foot clearance.      Baseline  fluctuates based on edema -- 02-19-17    Status  On-going      PT SHORT TERM GOAL #3   Title  Incr. amb. distance to at  least 525' in 3" walk test.    Status  Not Met      PT SHORT TERM GOAL #4   Title  Trial AFO for LLE for  incr. safety with ambulation.  (04-05-15)    Status  Deferred      PT SHORT TERM GOAL #5   Title  Independent in HEP for  LLE strengthening exercises.     Status  Achieved        PT Long Term Goals - 02/28/17 1358      PT LONG TERM GOAL #1   Title  Independent in updated HEP for LLE strengthening and stretching     Baseline  met 02-27-17    Status  Achieved      PT LONG TERM GOAL #2   Title  Report at least 50% improvement in LLE flexibility and ease of movement     Baseline  remains inconsistently met -  02-27-17    Status  Partially Met      PT LONG TERM GOAL #3   Title  Improve TUG score to </= 10 secs to demo improved mobility.     Baseline  10.16 secs with SPC on 02-28-17    Status  Partially Met      PT LONG TERM GOAL #4   Title  Incr. gait velocity to >/= 3.3 ft/sec with cane for incr. gait efficiency.      Baseline  2.68 ft/sec = 12.22 secs with cane    Status  On-going      PT LONG TERM GOAL #5   Title  Amb. at least 550' in 3" walk test with use of SPC to demo incr. step length and improved endurance.    Baseline  454' with SPC in 3" walk test on 02-27-17    Status  On-going      PT LONG TERM GOAL #7   Title  Perform SLS on LLE >/= 5 secs to demo improved balance.  (12-03-15)/ 01-27-16    Baseline  3.47 secs; 3.66 secs on 12-27-15; 2.4 secs on LLE on 02-28-17    Status  On-going      PT LONG TERM GOAL #8   Title  Transfer floor to stand with UE support with SBA/verbal cues.  (12-03-15)/ 01-27-16    Status  On-going            Plan - 03/05/17 2141    Clinical Impression Statement  Pt is progressing well towards goals - plan D/C next session;  Pt cont. to have decr. Lt foot clearance due to decrease Lt knee flexion in swing phase of gait and also due to edema in Lt foot and ankle    Rehab Potential  Good    PT Frequency  1x / week    PT Duration  8 weeks    PT  Treatment/Interventions  ADLs/Self Care Home Management;Therapeutic exercise;Therapeutic activities;Functional mobility training;Stair training;Gait training;DME Instruction;Balance training;Neuromuscular re-education;Patient/family education;Orthotic Fit/Training;Passive range of motion    PT Next Visit Plan  check LTG's - D/C    PT Home Exercise Plan  Stretches and strengthening for LLE    Consulted and Agree with Plan of Care  Patient       Patient will benefit from skilled therapeutic intervention in order to improve the following deficits and impairments:  Abnormal gait, Cardiopulmonary status limiting activity, Decreased activity tolerance, Decreased balance, Decreased mobility, Decreased strength, Decreased knowledge of use of DME, Decreased endurance, Decreased coordination, Decreased range of motion, Increased edema, Impaired flexibility  Visit Diagnosis: Other abnormalities of gait and mobility  Muscle weakness (generalized)  Unsteadiness on feet     Problem List Patient Active Problem List  Diagnosis Date Noted  . Acute cholecystitis 03/07/2016  . 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 Aneurysm, cerebral, nonruptured 05/31/2015  . Dizziness and giddiness 05/31/2015  . Altered mental status   . Acute CVA (cerebrovascular accident) ([place]) 03/15/2015  . Dysphasia 03/12/2015  . Thrombocytopenia ([place]) 03/12/2015  . Hypokalemia   . Endotracheally intubated   . Essential hypertension   . Intracranial aneurysm 11/18/2014  . Respiratory failure ([place])   . Cerebral aneurysm   . Brain aneurysm   . Aphasia   . Palmar erythema   . CVA (cerebral infarction) 05/02/2014  . Aneurysm ([place]) 05/02/2014  . Expressive aphasia 05/02/2014  . 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 Tobacco abuse 10/24/2010  . TIA (transient ischemic attack)   . Hypertension   . Ventricular hypertrophy   . CVA 02/23/2010  . STRESS FRACTURE, FOOT  02/22/2010    Alda Lea, PT 03/05/2017, 9:44 PM  [facility] [address] [place], [place], [address] Phone: [phone]   Fax:  [phone]  Name: Jeanne Lawson MRN: [id number] Date of Birth: [birth year]

## 2017-03-19 ENCOUNTER — Ambulatory Visit: Payer: Medicare Other | Admitting: Physical Therapy

## 2017-03-19 ENCOUNTER — Encounter: Payer: Self-pay | Admitting: Physical Therapy

## 2017-03-19 DIAGNOSIS — M6281 Muscle weakness (generalized): Secondary | ICD-10-CM

## 2017-03-19 DIAGNOSIS — R2689 Other abnormalities of gait and mobility: Secondary | ICD-10-CM

## 2017-03-19 NOTE — Patient Instructions (Signed)
Cane exercises - in front, out to side and behind back - lifting straight back with elbows straight   Wall push ups  Walking both hands up wall - hold 15- 20 secs for stretch  Corner stretch - face corner - "W" stretch - - elbows at shoulder height - hold 15 - 20 secs

## 2017-03-20 ENCOUNTER — Encounter: Payer: Self-pay | Admitting: Physical Therapy

## 2017-03-20 NOTE — Therapy (Signed)
[facility] [address] [place], [place], [address] Phone: [phone]   Fax:  [phone]  Physical Therapy Treatment  Patient Details  Name: Jeanne Lawson MRN: [id number] Date of Birth: Aug 11, 1945 Referring Provider: Dr. [name]   Encounter Date: 03/19/2017  PT End of Session - 03/20/17 2007    Visit Number  8    Date for PT Re-Evaluation  03/16/17    Authorization Type  [employer] Medicare    Authorization Time Period  02-19-17 - 04-19-17    PT Start Time  1018    PT Stop Time  1101    PT Time Calculation (min)  43 min       Past Medical History:  Diagnosis Date  . COPD (chronic obstructive pulmonary disease) ([place])   . Coronary artery disease 1996   Known with prior mild lesion of LAD demonstrated by Cardiac Catheterization in 1996  . Edema of foot    She has a history of chronic edema of the left dated back to age 35 when she suufered severe frostbite playing  on the snow as a child  . Hypertension   . PAC (premature atrial contraction)   . Pancreatitis    x2  . PONV (postoperative nausea and vomiting)    problems waking up last time 4/16 only time  . 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  Marland Kitchen Shortness of breath dyspnea    since stroke 2 months ago -  . 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 TIA (transient ischemic attack)    She was hospitalized 04-23-09 through 04-27-09 for involving right side of the body  . Tobacco abuse    Ongoing   . Ventricular hypertrophy 04/2009   LVH with diastolic dysfunction by echo. Has normal EF.    Past Surgical History:  Procedure Laterality Date  . ABDOMINAL HYSTERECTOMY    . APPENDECTOMY    . CARDIAC CATHETERIZATION  1996   Mild CAD with vasospasm  . CARDIOVASCULAR STRESS TEST   12-02-2001   EF 70%  . CHOLECYSTECTOMY N/A 03/09/2016   Procedure: LAPAROSCOPIC CHOLECYSTECTOMY WITH INTRAOPERATIVE CHOLANGIOGRAM;  Surgeon: Georganna Skeans, MD;  Location: [place];  Service: General;  Laterality: N/A;  . ENDARTERECTOMY Right 08/12/2014   Procedure: RIGHT  COMMON CAROTID ARTERY EXPOSURE FOR INTERVENTIONAL RADIOLOGY PROCEDURE BY DR.DEVASHWAR,Insertion 6 FR sheath;  Surgeon: Elam Dutch, MD;  Location: [place];  Service: Vascular;  Laterality: Right;  . ENDARTERECTOMY Right 08/12/2014   Procedure:  CAROTID  EXPOSURE CLOSURE RIGHT NECK, REPAIR RIGHT COMMON CAROTID ARTERY;  Surgeon: Elam Dutch, MD;  Location: [place];  Service: Vascular;  Laterality: Right;  . ENDARTERECTOMY Left 11/18/2014   Procedure: CAROTID EXPOSURE;  Surgeon: Elam Dutch, MD;  Location: [place];  Service: Vascular;  Laterality: Left;  . ENDARTERECTOMY Left 11/18/2014   Procedure: CLOSURE CAROTID;  Surgeon: Elam Dutch, MD;  Location: [place];  Service: Vascular;  Laterality: Left;  . IR RADIOLOGIST EVAL & MGMT  06/26/2016  . LAPAROSCOPY    . NECK SURGERY  50 yrs ago   Left side tumor  . RADIOLOGY WITH ANESTHESIA N/A 05/25/2014   Procedure: RADIOLOGY WITH ANESTHESIA;  Surgeon: [name], MD;  Location: [place];  Service: Radiology;  Laterality: N/A;  . RADIOLOGY WITH ANESTHESIA N/A 08/12/2014   Procedure: RADIOLOGY WITH ANESTHESIA;  Surgeon: [name], MD;  Location: [place];  Service: Radiology;  Laterality: N/A;  . RADIOLOGY WITH ANESTHESIA N/A 03/15/2015   Procedure: MRI OF BRAIN WITHOUT CONTRAST;  Surgeon: Medication Radiologist, MD;  Location: [facility];  Service: Radiology;  Laterality: N/A;  DR. TAT/MRI  . US ECHOCARDIOGRAPHY  04-26-2009   EF 65-70%  . VESICOVAGINAL FISTULA CLOSURE W/ TAH  25 yrs ago    There were no vitals filed for this visit.  Subjective Assessment - 03/20/17 1932    Subjective  Pt states she feels she has pulled a muscle in her upper back - was reaching for something  low in closet and pulled a muscle - states "I did not fall"    Pertinent History  h/o old R CVA in 2010; TIA 04-23-09 - 04-27-09:  CAD:  COPD:  chronic edema in L foot dated back to age 31: saccular aneurysm, paroxysmal atrial fibrillation, known cerebral aneurysms by MRA in 2011; ventricular hypertrophy:  HTN; gall bladder surgery Feb. 12, 2018    Diagnostic tests  MRI on 03-17-15; previous MRI's due to previous CVA    Patient Stated Goals  increase LLE strength and walk better, and increase arm strength    Currently in Pain?  Yes    Pain Score  5     Pain Location  Back    Pain Orientation  Left    Pain Descriptors / Indicators  Tightness;Discomfort;Dull    Pain Type  Acute pain    Pain Onset  In the past 7 days    Pain Frequency  Constant    Aggravating Factors   certain movements    Pain Relieving Factors  hot shower helps                      [facility] Adult PT Treatment/Exercise - 03/20/17 0001      Exercises   Exercises  Shoulder      Knee/Hip Exercises: Standing   Heel Raises  Both;1 set;10 reps LLE only 10 reps    Forward Step Up  Left;1 set;10 reps;Step Height: 6"      Shoulder Exercises: ROM/Strengthening   Wall Pushups  10 reps    Other ROM/Strengthening Exercises  Pt performed wand exercises for shoulder flexion, extension and horizontal abduction/adduction x 10 reps each     Other ROM/Strengthening Exercises  walking Lt fingers up wall with hold for stretch - pt able to achieve approx. 95 degrees flexion      Shoulder Exercises: Stretch   Corner Stretch  1 rep;30 seconds       Self Care: Discussed progress and LTG's; new onset of Lt sided upper back pain appears to be due to tightness in Lt lateral trunk and thoracic  Region and Lt shoulder tightness       PT Education - 03/20/17 2006    Education provided  Yes    Education Details  Added Lt shoulder exercises to HEP for stretching - see pt instructions    Person(s) Educated  Patient    Methods   Explanation;Demonstration;Handout    Comprehension  Verbalized understanding;Returned demonstration       PT Short Term Goals - 02/28/17 1405      PT SHORT TERM GOAL #1   Title  Increase gait velocity to >/= 2.90 ft/sec with cane for incr. gait efficiency.      Status  On-going      PT SHORT TERM GOAL #2   Title  Pt will report at least 25% improvement in ambulation with incr. ease with L foot clearance.      Baseline  fluctuates based on edema -- 02-19-17    Status  On-going      PT SHORT TERM GOAL #3   Title  Incr. amb. distance to at least 525' in 3" walk test.    Status  Not Met      PT SHORT TERM GOAL #4   Title  Trial AFO for LLE for incr. safety with ambulation.  (04-05-15)    Status  Deferred      PT SHORT TERM GOAL #5   Title  Independent in HEP for  LLE strengthening exercises.     Status  Achieved        PT Long Term Goals - 03/20/17 2009      PT LONG TERM GOAL #1   Title  Independent in updated HEP for LLE strengthening and stretching     Baseline  met 03-20-17    Status  Achieved      PT LONG TERM GOAL #2   Title  Report at least 50% improvement in LLE flexibility and ease of movement     Baseline  remains inconsistently met -  02-27-17/ 03-20-17    Status  Partially Met      PT LONG TERM GOAL #3   Title  Improve TUG score to </= 10 secs to demo improved mobility.     Baseline  10.16 secs with SPC on 02-28-17    Status  Not Met      PT LONG TERM GOAL #4   Title  Incr. gait velocity to >/= 3.3 ft/sec with cane for incr. gait efficiency.      Baseline  2.68 ft/sec = 12.22 secs with cane    Status  Not Met      PT LONG TERM GOAL #5   Title  Amb. at least 550' in 3" walk test with use of SPC to demo incr. step length and improved endurance.    Baseline  454' with SPC in 3" walk test on 02-27-17    Status  Not Met      PT LONG TERM GOAL #7   Title  Perform SLS on LLE >/= 5 secs to demo improved balance.  (12-03-15)/ 01-27-16    Baseline  3.47 secs; 3.66 secs on  12-27-15; 2.4 secs on LLE on 02-28-17    Status  Not Met      PT LONG TERM GOAL #8   Title  Transfer floor to stand with UE support with SBA/verbal cues.  (12-03-15)/ 01-27-16    Status  Deferred              Patient will benefit from skilled therapeutic intervention in order to improve the following deficits and impairments:     Visit Diagnosis: Other abnormalities of gait and mobility  Muscle weakness (generalized)     Problem List Patient Active Problem List   Diagnosis Date Noted  . Acute cholecystitis 03/07/2016  . 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 Aneurysm, cerebral, nonruptured 05/31/2015  . Dizziness and giddiness 05/31/2015  . Altered mental status   . Acute CVA (cerebrovascular accident) ([place]) 03/15/2015  . Dysphasia 03/12/2015  . Thrombocytopenia ([place]) 03/12/2015  . Hypokalemia   . Endotracheally intubated   . Essential hypertension   . Intracranial aneurysm 11/18/2014  .  Respiratory failure ([place])   . Cerebral aneurysm   . Brain aneurysm   . Aphasia   . Palmar erythema   . CVA (cerebral infarction) 05/02/2014  . Aneurysm ([place]) 05/02/2014  . Expressive aphasia 05/02/2014  . 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 Tobacco abuse 10/24/2010  . TIA (transient ischemic attack)   . Hypertension   . Ventricular hypertrophy   . CVA 02/23/2010  . STRESS FRACTURE, FOOT 02/22/2010    Alda Lea, PT 03/20/2017, 8:13 PM  [facility] [address] [place], [place], [address] Phone: [phone]   Fax:  [phone]  Name: Jeanne Lawson MRN: [id number] Date of Birth: Aug 10, 1945

## 2017-04-11 ENCOUNTER — Other Ambulatory Visit: Payer: Self-pay | Admitting: Cardiology

## 2017-04-11 DIAGNOSIS — I2510 Atherosclerotic heart disease of native coronary artery without angina pectoris: Secondary | ICD-10-CM

## 2017-04-11 DIAGNOSIS — E782 Mixed hyperlipidemia: Secondary | ICD-10-CM

## 2017-04-11 DIAGNOSIS — I119 Hypertensive heart disease without heart failure: Secondary | ICD-10-CM

## 2017-04-11 MED ORDER — TOPROL XL 50 MG PO TB24: ORAL_TABLET | ORAL | 3 refills | Status: DC

## 2017-04-16 ENCOUNTER — Ambulatory Visit (HOSPITAL_COMMUNITY)
Admission: RE | Admit: 2017-04-16 | Discharge: 2017-04-16 | Disposition: A | Discharge status: Home / Self Care | Payer: Medicare Other | Source: Ambulatory Visit | Attending: Interventional Radiology | Admitting: Interventional Radiology

## 2017-04-16 ENCOUNTER — Ambulatory Visit (HOSPITAL_COMMUNITY): Payer: Medicare Other

## 2017-04-16 DIAGNOSIS — Q2739 Arteriovenous malformation, other site: Secondary | ICD-10-CM | POA: Diagnosis not present

## 2017-04-16 DIAGNOSIS — I708 Atherosclerosis of other arteries: Secondary | ICD-10-CM | POA: Insufficient documentation

## 2017-04-16 DIAGNOSIS — I700 Atherosclerosis of aorta: Secondary | ICD-10-CM | POA: Insufficient documentation

## 2017-04-16 DIAGNOSIS — I729 Aneurysm of unspecified site: Secondary | ICD-10-CM | POA: Diagnosis present

## 2017-04-16 LAB — POCT I-STAT CREATININE: CREATININE: 0.9 mg/dL (ref 0.44–1.00)

## 2017-04-16 MED ORDER — IOPAMIDOL (ISOVUE-370) INJECTION 76%
INTRAVENOUS | Status: AC
  Administered 2017-04-16: 50 mL
  Filled 2017-04-16: qty 50

## 2017-05-01 ENCOUNTER — Other Ambulatory Visit (HOSPITAL_COMMUNITY): Payer: Self-pay | Admitting: Interventional Radiology

## 2017-05-01 DIAGNOSIS — I729 Aneurysm of unspecified site: Secondary | ICD-10-CM

## 2017-05-07 ENCOUNTER — Ambulatory Visit (HOSPITAL_COMMUNITY)
Admission: RE | Admit: 2017-05-07 | Discharge: 2017-05-07 | Disposition: A | Discharge status: Home / Self Care | Payer: Medicare Other | Source: Ambulatory Visit | Attending: Interventional Radiology | Admitting: Interventional Radiology

## 2017-05-07 DIAGNOSIS — I729 Aneurysm of unspecified site: Secondary | ICD-10-CM

## 2017-05-07 NOTE — Consult Note (Signed)
Chief Complaint: Patient was seen in consultation today for right ICA PCOM aneurysm s/p embolization.  Supervising Physician: [name]  Patient Status: [facility] - Out-pt  History of Present Illness: Jeanne Lawson is a 72 y.o. female  Hx hypertension, COPD, TIA (04/2009), stroke 10/2008, hypertension, CAD, PAC, and COPD.  Known to NIR, followed by Dr. [name]: 08/12/2014: right ICA PCOM aneurysm embolization 11/08/2014: left ACA aneurysm embolization  CTA head/neck 04/16/2017: 1. 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 Note that the right posterior communicating artery remains patent and may arise from the aneurysm neck (series 8, image 100). Right ICA covered stent remains patent. 2. Stable and satisfactory post treatment appearance of the thrombosed Left ICA/A1 aneurysm. Left ICA covered stent remains patent. 3. Mild irregularity has developed in the left MCA branches since 2017 and might reflect increased atherosclerosis. No associated stenosis. 4. Otherwise stable and negative anterior and posterior circulation. 5. Minor atherosclerosis in the neck with no stenosis. 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 6. Stable CT appearance of the brain since 2017. 7. Stable Aortic Atherosclerosis (ICD10-I70.0), tortuous great vessels with atherosclerosis. Stable proximal right subclavian stenosis estimated at 50%.  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 Patient has no complaints at this time. Denies headache, weakness, dizziness, numbness/tingling, syncope, vision changes, hearing changes, tinnitus, or speech difficulty.  Patient is taking Aspirin 81 mg once daily.  Past Medical History:  Diagnosis Date  . COPD (chronic obstructive pulmonary  disease) ([place])   . Coronary artery disease 1996   Known with prior mild lesion of LAD demonstrated by Cardiac Catheterization in 1996  . Edema of foot    She has a history of chronic edema of the left dated back to age 1 when she suufered severe frostbite playing  on the snow as a child  . Hypertension   . PAC (premature atrial contraction)   . Pancreatitis    x2  . PONV (postoperative nausea and vomiting)    problems waking up last time 4/16 only time  . 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  Marland Kitchen Shortness of breath dyspnea    since stroke 2 months ago -  . 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 TIA (transient ischemic attack)    She was hospitalized 04-23-09 through 04-27-09 for involving right side of the body  . Tobacco abuse    Ongoing   . Ventricular hypertrophy 04/2009   LVH with diastolic dysfunction by echo. Has normal EF.    Past Surgical History:  Procedure Laterality Date  . ABDOMINAL HYSTERECTOMY    . APPENDECTOMY    . CARDIAC CATHETERIZATION  1996   Mild CAD with vasospasm  . CARDIOVASCULAR STRESS TEST  12-02-2001   EF 70%  . CHOLECYSTECTOMY N/A 03/09/2016   Procedure: LAPAROSCOPIC CHOLECYSTECTOMY WITH INTRAOPERATIVE CHOLANGIOGRAM;  Surgeon: Georganna Skeans, MD;  Location: [place];  Service: General;  Laterality: N/A;  . ENDARTERECTOMY Right 08/12/2014   Procedure: RIGHT  COMMON CAROTID ARTERY EXPOSURE FOR INTERVENTIONAL RADIOLOGY PROCEDURE BY DR.DEVASHWAR,Insertion 6 FR sheath;  Surgeon: Elam Dutch, MD;  Location:  [facility] OR;  Service: Vascular;  Laterality: Right;  . ENDARTERECTOMY Right 08/12/2014   Procedure:  CAROTID  EXPOSURE CLOSURE RIGHT NECK, REPAIR RIGHT COMMON CAROTID ARTERY;  Surgeon: Elam Dutch, MD;  Location: [place];  Service: Vascular;  Laterality: Right;  .  ENDARTERECTOMY Left 11/18/2014   Procedure: CAROTID EXPOSURE;  Surgeon: Elam Dutch, MD;  Location: [place];  Service: Vascular;  Laterality: Left;  . ENDARTERECTOMY Left 11/18/2014   Procedure: CLOSURE CAROTID;  Surgeon: Elam Dutch, MD;  Location: [facility];  Service: Vascular;  Laterality: Left;  . IR RADIOLOGIST EVAL & MGMT  06/26/2016  . LAPAROSCOPY    . NECK SURGERY  50 yrs ago   Left side tumor  . RADIOLOGY WITH ANESTHESIA N/A 05/25/2014   Procedure: RADIOLOGY WITH ANESTHESIA;  Surgeon: [name], MD;  Location: [place];  Service: Radiology;  Laterality: N/A;  . RADIOLOGY WITH ANESTHESIA N/A 08/12/2014   Procedure: RADIOLOGY WITH ANESTHESIA;  Surgeon: [name], MD;  Location: [place];  Service: Radiology;  Laterality: N/A;  . RADIOLOGY WITH ANESTHESIA N/A 03/15/2015   Procedure: MRI OF BRAIN WITHOUT CONTRAST;  Surgeon: Medication Radiologist, MD;  Location: [place];  Service: Radiology;  Laterality: N/A;  DR. TAT/MRI  . US ECHOCARDIOGRAPHY  04-26-2009   EF 65-70%  . VESICOVAGINAL FISTULA CLOSURE W/ TAH  25 yrs ago    Allergies: Dilaudid [hydromorphone]; Codeine; Latex; Lidocaine; and Sulfa drugs cross reactors  Medications: Prior to Admission medications   Medication Sig Start Date End Date Taking? Authorizing Provider  albuterol (PROVENTIL HFA;VENTOLIN HFA) 108 (90 BASE) MCG/ACT inhaler Inhale 1-2 puffs into the lungs every 6 (six) hours as needed for wheezing or shortness of breath.    [provider]  aspirin EC 81 MG tablet Take 81 mg by mouth daily at 12 noon.    [provider]  lisinopril-hydrochlorothiazide (PRINZIDE,ZESTORETIC) 20-12.5 MG tablet Take 1 tablet by mouth daily. 02/26/17   Dorothy Spark, MD  TOPROL XL 50 MG 24 hr tablet Take 1/2 tablet by mouth twice daily.  Take with or immediately following a meal. 04/11/17   Dorothy Spark, MD     Family History  Problem Relation Age of Onset  . Emphysema Father   . Diabetes Father   .  Aneurysm Sister   . Stroke Mother   . Hypertension Mother   . Heart disease Mother   . Hypertension Daughter   . Thyroid disease Daughter     Social History   Socioeconomic History  . Marital status: Widowed    Spouse name: Not on file  . Number of children: Not on file  . Years of education: Not on file  . Highest education level: Not on file  Occupational History  . Not on file  Social Needs  . Financial resource strain: Not on file  . Food insecurity:    Worry: Not on file    Inability: Not on file  . Transportation needs:    Medical: Not on file    Non-medical: Not on file  Tobacco Use  . Smoking status: Former Smoker    Packs/day: 1.00    Years: 30.00    Pack years: 30.00    Types: Cigarettes    Last attempt to quit: 04/26/2014    Years since quitting: 3.0  . Smokeless tobacco: Never Used  Substance and Sexual Activity  . Alcohol use: No  . Drug use: No  . Sexual activity: Not on file  Lifestyle  .  Physical activity:    Days per week: Not on file    Minutes per session: Not on file  . Stress: Not on file  Relationships  . Social connections:    Talks on phone: Not on file    Gets together: Not on file    Attends religious service: Not on file    Active member of club or organization: Not on file    Attends meetings of clubs or organizations: Not on file    Relationship status: Not on file  Other Topics Concern  . Not on file  Social History Narrative  . Not on file     Review of Systems: A 12 point ROS discussed and pertinent positives are indicated in the HPI above.  All other systems are negative.  Review of Systems  Constitutional: Negative for activity change and fever.  HENT: Negative for hearing loss and tinnitus.   Eyes: Negative for visual disturbance.  Respiratory: Negative for shortness of breath and wheezing.   Cardiovascular: Negative for chest pain and palpitations.  Neurological: Negative for dizziness, syncope, weakness, numbness  and headaches.  Psychiatric/Behavioral: Negative for behavioral problems and confusion.    Vital Signs: There were no vitals taken for this visit.  Physical Exam  Constitutional: She is oriented to person, place, and time. She appears well-developed and well-nourished. No distress.  Neurological: She is alert and oriented to person, place, and time.  Psychiatric: She has a normal mood and affect. Her behavior is normal. Judgment and thought content normal.    Imaging: Ct Angio Head W Or Wo Contrast  Result Date: 04/16/2017 CLINICAL DATA:  72 year old female status post endovascular treated right supraclinoid ICA and left ACA origin aneurysms, using bilateral carotid termini pipeline stents.  Restaging. EXAM: CT ANGIOGRAPHY HEAD AND NECK TECHNIQUE: Multidetector CT imaging of the head and neck was performed using the standard protocol during bolus administration of intravenous contrast. Multiplanar CT image reconstructions and MIPs were obtained to evaluate the vascular anatomy. Carotid stenosis measurements (when applicable) are obtained utilizing NASCET criteria, using the distal internal carotid diameter as the denominator. CONTRAST:  50 milliliters Isovue 370 ISOVUE-370 IOPAMIDOL (ISOVUE-370) INJECTION 76% COMPARISON:  Post treatment CTA head and neck 06/05/2016, 10/11/2015, and earlier FINDINGS: CT HEAD Brain: Stable cerebral volume. Patchy and confluent bilateral cerebral white matter hypodensity with associated mild-to-moderate bilateral deep gray matter heterogeneity is stable since 2017. No midline shift, ventriculomegaly, mass effect, evidence of mass lesion, intracranial hemorrhage or evidence of cortically based acute infarction. Calvarium and skull base: Stable. No acute osseous abnormality identified. Paranasal sinuses: Stable and well pneumatized except for the lateral aspect of the left frontal sinus. Orbits: Stable orbit and scalp soft tissues. CTA NECK Skeleton: No acute osseous  abnormality identified. Upper chest: No superior mediastinal lymphadenopathy. Stable visible lung parenchyma. Other neck: Stable and negative.  No cervical lymphadenopathy. Aortic arch: Bovine type arch configuration. Stable visible thoracic aorta with mild tortuosity and mild to moderate atherosclerosis. Right carotid system: Stable tortuosity of the brachiocephalic artery and proximal right CCA without stenosis. Minimal atherosclerosis at the right carotid bifurcation is unchanged without plaque. Tortuosity, and fusiform aneurysmal enlargement of the cervical right ICA just below the skull base is stable measuring up to 9 millimeters diameter. Minimal associated calcified atherosclerosis. Left carotid system: Bovine type left CCA origin without plaque or stenosis. Tortuous proximal left CCA. Minor plaque at the left carotid bifurcation has increased since 2017 but there is no stenosis. Mild calcified plaque at the left  ICA bulb is stable without stenosis. Tortuous left ICA with no stenosis to the skull base. Vertebral arteries: Tortuous and calcified proximal right subclavian artery appears stable with up to 50 % stenosis with respect to the distal vessel (series 8, image 257). Normal right vertebral artery origin. Mildly tortuous right V1 segment. Tortuous right V 2 segment. Mild right V3 calcified plaque is stable. The right vertebral artery is mildly non dominant and patent to the skull base without stenosis. Proximal left subclavian tortuosity and atherosclerosis with mostly calcified plaque is stable. No significant stenosis. Normal left vertebral artery origin. Tortuous left V1 segment. The left vertebral artery is dominant and patent to the skull base without stenosis. There is minimal V2 segment plaque. CTA HEAD Posterior circulation: Normal distal vertebral arteries, the left is dominant and the right is diminutive beyond the PICA origin. The left PICA origin is normal. Patent basilar artery without  stenosis. SCA and PCA origins are stable and within normal limits. Both posterior communicating arteries remain patent. Bilateral PCA branches are within normal limits. Anterior circulation: Both ICA siphons are patent. There is minor siphon calcified plaque, primarily on the right, without stenosis. On the left the supraclinoid segment stent which terminates in the left M1 segment is patent and stable in configuration. Normal left ophthalmic artery origin. The left PCOM origin remains patent along the proximal aspect of the stent. The left A1 region aneurysm remains thrombosed. The left M1 remains patent to the left MCA bifurcation. Left MCA branches are patent but demonstrate mildly increased irregularity compared to 2017 (series 13, image 28). The left ACA A1 and A2 segments are supplied via a patent and normal anterior communicating artery. Bilateral ACA branches appear stable since 2017 with mild irregularity. The right ICA siphon stent remains patent from the distal cavernous segment to the right PCOM region. The right PCOM region aneurysm remains patent and measures 8 x 8 x 9 millimeters (AP by transverse by CC), stable since 2017. As before, the right P com may be arising from the patent aneurysm neck (series 8, image 100). The right M1 and A1 segments remain patent and appear stable since 2017. The right M1, right MCA bifurcation, and right MCA branches appear stable since 2017 with mild irregularity. Venous sinuses: Patent. Anatomic variants: Bovine type aortic arch configuration. Dominant left vertebral artery. Diminutive distal right vertebral artery beyond the right PICA origin. Delayed phase: No abnormal parenchymal enhancement. Review of the MIP images confirms the above findings IMPRESSION: 1. 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 Note that the right posterior communicating artery remains patent and may arise from the aneurysm  neck (series 8, image 100). Right ICA covered stent remains patent. 2. Stable and satisfactory post treatment appearance of the thrombosed Left ICA/A1 aneurysm. Left ICA covered stent remains patent. 3. Mild irregularity has developed in the left MCA branches since 2017 and might reflect increased atherosclerosis. No associated stenosis. 4. Otherwise stable and negative anterior and posterior circulation. 5. Minor atherosclerosis in the neck with no stenosis. 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 6. Stable CT appearance of the brain since 2017. 7. Stable Aortic Atherosclerosis (ICD10-I70.0), tortuous great vessels with atherosclerosis. Stable proximal right subclavian stenosis estimated at 50%. Electronically Signed   By: Genevie Ann M.D.   On: 04/16/2017 19:32   Ct Angio Neck W Or Wo Contrast  Result  Date: 04/16/2017 CLINICAL DATA:  72 year old female status post endovascular treated right supraclinoid ICA and left ACA origin aneurysms, using bilateral carotid termini pipeline stents.  Restaging. EXAM: CT ANGIOGRAPHY HEAD AND NECK TECHNIQUE: Multidetector CT imaging of the head and neck was performed using the standard protocol during bolus administration of intravenous contrast. Multiplanar CT image reconstructions and MIPs were obtained to evaluate the vascular anatomy. Carotid stenosis measurements (when applicable) are obtained utilizing NASCET criteria, using the distal internal carotid diameter as the denominator. CONTRAST:  50 milliliters Isovue 370 ISOVUE-370 IOPAMIDOL (ISOVUE-370) INJECTION 76% COMPARISON:  Post treatment CTA head and neck 06/05/2016, 10/11/2015, and earlier FINDINGS: CT HEAD Brain: Stable cerebral volume. Patchy and confluent bilateral cerebral white matter hypodensity with associated mild-to-moderate bilateral deep gray matter heterogeneity is stable since 2017. No midline shift, ventriculomegaly,  mass effect, evidence of mass lesion, intracranial hemorrhage or evidence of cortically based acute infarction. Calvarium and skull base: Stable. No acute osseous abnormality identified. Paranasal sinuses: Stable and well pneumatized except for the lateral aspect of the left frontal sinus. Orbits: Stable orbit and scalp soft tissues. CTA NECK Skeleton: No acute osseous abnormality identified. Upper chest: No superior mediastinal lymphadenopathy. Stable visible lung parenchyma. Other neck: Stable and negative.  No cervical lymphadenopathy. Aortic arch: Bovine type arch configuration. Stable visible thoracic aorta with mild tortuosity and mild to moderate atherosclerosis. Right carotid system: Stable tortuosity of the brachiocephalic artery and proximal right CCA without stenosis. Minimal atherosclerosis at the right carotid bifurcation is unchanged without plaque. Tortuosity, and fusiform aneurysmal enlargement of the cervical right ICA just below the skull base is stable measuring up to 9 millimeters diameter. Minimal associated calcified atherosclerosis. Left carotid system: Bovine type left CCA origin without plaque or stenosis. Tortuous proximal left CCA. Minor plaque at the left carotid bifurcation has increased since 2017 but there is no stenosis. Mild calcified plaque at the left ICA bulb is stable without stenosis. Tortuous left ICA with no stenosis to the skull base. Vertebral arteries: Tortuous and calcified proximal right subclavian artery appears stable with up to 50 % stenosis with respect to the distal vessel (series 8, image 257). Normal right vertebral artery origin. Mildly tortuous right V1 segment. Tortuous right V 2 segment. Mild right V3 calcified plaque is stable. The right vertebral artery is mildly non dominant and patent to the skull base without stenosis. Proximal left subclavian tortuosity and atherosclerosis with mostly calcified plaque is stable. No significant stenosis. Normal left  vertebral artery origin. Tortuous left V1 segment. The left vertebral artery is dominant and patent to the skull base without stenosis. There is minimal V2 segment plaque. CTA HEAD Posterior circulation: Normal distal vertebral arteries, the left is dominant and the right is diminutive beyond the PICA origin. The left PICA origin is normal. Patent basilar artery without stenosis. SCA and PCA origins are stable and within normal limits. Both posterior communicating arteries remain patent. Bilateral PCA branches are within normal limits. Anterior circulation: Both ICA siphons are patent. There is minor siphon calcified plaque, primarily on the right, without stenosis. On the left the supraclinoid segment stent which terminates in the left M1 segment is patent and stable in configuration. Normal left ophthalmic artery origin. The left PCOM origin remains patent along the proximal aspect of the stent. The left A1 region aneurysm remains thrombosed. The left M1 remains patent to the left MCA bifurcation. Left MCA branches are patent but demonstrate mildly increased irregularity compared to 2017 (series 13, image 28). The left ACA  A1 and A2 segments are supplied via a patent and normal anterior communicating artery. Bilateral ACA branches appear stable since 2017 with mild irregularity. The right ICA siphon stent remains patent from the distal cavernous segment to the right PCOM region. The right PCOM region aneurysm remains patent and measures 8 x 8 x 9 millimeters (AP by transverse by CC), stable since 2017. As before, the right P com may be arising from the patent aneurysm neck (series 8, image 100). The right M1 and A1 segments remain patent and appear stable since 2017. The right M1, right MCA bifurcation, and right MCA branches appear stable since 2017 with mild irregularity. Venous sinuses: Patent. Anatomic variants: Bovine type aortic arch configuration. Dominant left vertebral artery. Diminutive distal right  vertebral artery beyond the right PICA origin. Delayed phase: No abnormal parenchymal enhancement. Review of the MIP images confirms the above findings IMPRESSION: 1. 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 Note that the right posterior communicating artery remains patent and may arise from the aneurysm neck (series 8, image 100). Right ICA covered stent remains patent. 2. Stable and satisfactory post treatment appearance of the thrombosed Left ICA/A1 aneurysm. Left ICA covered stent remains patent. 3. Mild irregularity has developed in the left MCA branches since 2017 and might reflect increased atherosclerosis. No associated stenosis. 4. Otherwise stable and negative anterior and posterior circulation. 5. Minor atherosclerosis in the neck with no stenosis. 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 6. Stable CT appearance of the brain since 2017. 7. Stable Aortic Atherosclerosis (ICD10-I70.0), tortuous great vessels with atherosclerosis. Stable proximal right subclavian stenosis estimated at 50%. Electronically Signed   By: Genevie Ann M.D.   On: 04/16/2017 19:32    Labs:  CBC: No results for input(s): WBC, HGB, HCT, PLT in the last 8760 hours.  COAGS: No results for input(s): INR, APTT in the last 8760 hours.  BMP: Recent Labs    06/05/16 1118 04/16/17 1536  CREATININE 0.80 0.90    LIVER FUNCTION TESTS: No results for input(s): BILITOT, AST, ALT, ALKPHOS, PROT, ALBUMIN in the last 8760 hours.  TUMOR MARKERS: No results for input(s): AFPTM, CEA, CA199, CHROMGRNA in the last 8760 hours.  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 Imaging reviewed with patient. Explained that patient's left ACA aneurysm has resolved, and her right ICA PCOM aneurysm is residual but smaller. Explained that this is probably due  to a vessel feeding the aneurysm.  Explained management options for residual right ICA PCOM aneuryusm, including continuing medical management with CTA in 6 months verses cerebral angiogram ASAP. Patient states she wishes to discuss management options with family before making a decision.  Plan for possible follow-up with cerebral angiogram ASAP versus CTA in 6 months. Patient state she will call the office next week with her decision regarding the cerebral angiogram.  Instructed patient to continue taking Aspirin 81 mg once daily.  All questions answered and concerns addressed. Patient conveys understanding and agrees with plan.  Thank you for this interesting consult.  I greatly enjoyed meeting Jeanne Lawson and look forward to participating in their care.  A copy of this report was sent to the requesting provider on this date.  Electronically Signed: Earley Abide, PA-C 05/07/2017, 3:42 PM   I spent a total of 30 minutes in face to face in clinical consultation,  greater than 50% of which was counseling/coordinating care for right ICA PCOM aneurysm s/p embolization.

## 2017-05-08 ENCOUNTER — Telehealth: Payer: Self-pay

## 2017-05-08 HISTORY — PX: IR RADIOLOGIST EVAL & MGMT: IMG5224

## 2017-05-08 NOTE — Telephone Encounter (Signed)
**Note De-Identified Swannie Milius Obfuscation** I have done a name brand Toprol XL PA through covermy meds. The following message was received: Approved today  NIDPOE:[id number]; Status:Approved;Review Type:Prior Auth;Coverage Start Date:04/08/2017;Coverage End Date:05/08/2018;  I have notified the pts pharmacy.

## 2017-05-16 ENCOUNTER — Telehealth (HOSPITAL_COMMUNITY): Payer: Self-pay | Admitting: Radiology

## 2017-05-16 NOTE — Telephone Encounter (Signed)
Called pt and discussed possible dates for retreatment of her previously treated aneurysm. The patient was given several available dates and is going to talk to her family and call back with a date. JM

## 2017-05-17 ENCOUNTER — Encounter (HOSPITAL_COMMUNITY): Payer: Self-pay | Admitting: Interventional Radiology

## 2017-05-25 ENCOUNTER — Other Ambulatory Visit: Payer: Self-pay | Admitting: *Deleted

## 2017-06-05 ENCOUNTER — Other Ambulatory Visit (HOSPITAL_COMMUNITY): Payer: Self-pay | Admitting: Interventional Radiology

## 2017-06-05 DIAGNOSIS — I671 Cerebral aneurysm, nonruptured: Secondary | ICD-10-CM

## 2017-06-12 NOTE — Pre-Procedure Instructions (Signed)
THOMASINE KLUTTS  06/12/2017      [facility] [place], [place] - [place] [address] Phone: [phone] Fax: [phone]  [facility] [place], [place] [address] [place] [address] Phone: [phone] Fax: [phone]    Your procedure is scheduled on Wed., Jun 20, 2017 from 7:30AM-8:30AM  Report to [facility] Admitting Entrance "A" at 5:30AM  Call this number if you have problems the morning of surgery:  [phone]   Remember:  No food after midnight on May 28th   Take these medicines the morning of surgery with A SIP OF WATER: TOPROL XL. If needed Albuterol Inhaler for cough or wheezing (Bring with you the day of surgery).  Follow your doctors instructions regarding your Aspirin.  If no instructions were given by your doctor, then you will need to call the prescribing office office to get instructions.    As of today, stop taking all Other Aspirin Products, Vitamins, Fish oils, and Herbal medications. Also stop all NSAIDS i.e. Advil, Ibuprofen, Motrin, Aleve, Anaprox, Naproxen, BC and Goody Powders.    Do not wear jewelry, make-up or nail polish.  Do not wear lotions, powders, or perfumes, or deodorant.  Do not shave 48 hours prior to surgery.    Do not bring valuables to the hospital.  [facility] is not responsible for any belongings or valuables.  Contacts, dentures or bridgework may not be worn into surgery.  Leave your suitcase in the car.  After surgery it may be brought to your room.  For patients admitted to the hospital, discharge time will be determined by your treatment team.  Patients discharged the day of surgery will not be allowed to drive home.   Special instructions: [place]- Preparing For Surgery  Before surgery, you can play an important role. Because skin is not sterile, your skin needs to be as free of germs as possible.  You can reduce the number of germs on your skin by washing with CHG (chlorahexidine gluconate) Soap before surgery.  CHG is an antiseptic cleaner which kills germs and bonds with the skin to continue killing germs even after washing.    Oral Hygiene is also important to reduce your risk of infection.  Remember - BRUSH YOUR TEETH THE MORNING OF SURGERY WITH YOUR REGULAR TOOTHPASTE  Please do not use if you have an allergy to CHG or antibacterial soaps. If your skin becomes reddened/irritated stop using the CHG.  Do not shave (including legs and underarms) for at least 48 hours prior to first CHG shower. It is OK to shave your face.  Please follow these instructions carefully.   1. Shower the NIGHT BEFORE SURGERY and the MORNING OF SURGERY with CHG.   2. If you chose to wash your hair, wash your hair first as usual with your normal shampoo.  3. After you shampoo, rinse your hair and body thoroughly to remove the shampoo.  4. Use CHG as you would any other liquid soap. You can apply CHG directly to the skin and wash gently with a scrungie or a clean washcloth.   5. Apply the CHG Soap to your body ONLY FROM THE NECK DOWN.  Do not use on open wounds or open sores. Avoid contact with your eyes, ears, mouth and genitals (private parts). Wash Face and genitals (private parts)  with your normal soap.  6. Wash thoroughly, paying special attention to the area where your surgery will be performed.  7. Thoroughly rinse your body with warm water from the neck down.  8. DO NOT shower/wash with your normal soap after using and rinsing off the CHG Soap.  9. Pat yourself dry with a CLEAN TOWEL.  10. Wear CLEAN PAJAMAS to bed the night before surgery, wear comfortable clothes the morning of surgery  11. Place CLEAN SHEETS on your bed the night of your first shower and DO NOT SLEEP WITH PETS.  Day of Surgery:  Do not apply any deodorants/lotions.  Please wear clean clothes to the hospital/surgery  center.   Remember to brush your teeth WITH YOUR REGULAR TOOTHPASTE.  Please read over the following fact sheets that you were given. Pain Booklet, Coughing and Deep Breathing, MRSA Information and Surgical Site Infection Prevention

## 2017-06-13 ENCOUNTER — Other Ambulatory Visit: Payer: Self-pay

## 2017-06-13 ENCOUNTER — Encounter (HOSPITAL_COMMUNITY)
Admission: RE | Admit: 2017-06-13 | Discharge: 2017-06-13 | Disposition: A | Discharge status: Home / Self Care | Payer: Medicare Other | Source: Ambulatory Visit | Attending: Vascular Surgery | Admitting: Vascular Surgery

## 2017-06-13 ENCOUNTER — Encounter (HOSPITAL_COMMUNITY): Payer: Self-pay

## 2017-06-13 ENCOUNTER — Telehealth: Payer: Self-pay | Admitting: Student

## 2017-06-13 DIAGNOSIS — I491 Atrial premature depolarization: Secondary | ICD-10-CM | POA: Diagnosis not present

## 2017-06-13 DIAGNOSIS — Z8673 Personal history of transient ischemic attack (TIA), and cerebral infarction without residual deficits: Secondary | ICD-10-CM | POA: Insufficient documentation

## 2017-06-13 DIAGNOSIS — Z01812 Encounter for preprocedural laboratory examination: Secondary | ICD-10-CM | POA: Insufficient documentation

## 2017-06-13 HISTORY — DX: Malignant (primary) neoplasm, unspecified: C80.1

## 2017-06-13 LAB — COMPREHENSIVE METABOLIC PANEL
ALT: 16 U/L (ref 14–54)
AST: 23 U/L (ref 15–41)
Albumin: 3.7 g/dL (ref 3.5–5.0)
Alkaline Phosphatase: 45 U/L (ref 38–126)
Anion gap: 10 (ref 5–15)
BUN: 9 mg/dL (ref 6–20)
CHLORIDE: 100 mmol/L — AB (ref 101–111)
CO2: 28 mmol/L (ref 22–32)
CREATININE: 0.78 mg/dL (ref 0.44–1.00)
Calcium: 9.3 mg/dL (ref 8.9–10.3)
Glucose, Bld: 102 mg/dL — ABNORMAL HIGH (ref 65–99)
POTASSIUM: 3.8 mmol/L (ref 3.5–5.1)
Sodium: 138 mmol/L (ref 135–145)
Total Bilirubin: 1.1 mg/dL (ref 0.3–1.2)
Total Protein: 7.3 g/dL (ref 6.5–8.1)

## 2017-06-13 LAB — URINALYSIS, ROUTINE W REFLEX MICROSCOPIC
Bilirubin Urine: NEGATIVE
Glucose, UA: NEGATIVE mg/dL
KETONES UR: NEGATIVE mg/dL
LEUKOCYTES UA: NEGATIVE
Nitrite: NEGATIVE
PH: 6 (ref 5.0–8.0)
PROTEIN: NEGATIVE mg/dL
Specific Gravity, Urine: 1.006 (ref 1.005–1.030)

## 2017-06-13 LAB — PROTIME-INR
INR: 0.95
PROTHROMBIN TIME: 12.6 s (ref 11.4–15.2)

## 2017-06-13 LAB — CBC
HCT: 36 % (ref 36.0–46.0)
Hemoglobin: 11.4 g/dL — ABNORMAL LOW (ref 12.0–15.0)
MCH: 27 pg (ref 26.0–34.0)
MCHC: 31.7 g/dL (ref 30.0–36.0)
MCV: 85.1 fL (ref 78.0–100.0)
PLATELETS: 125 10*3/uL — AB (ref 150–400)
RBC: 4.23 MIL/uL (ref 3.87–5.11)
RDW: 15.5 % (ref 11.5–15.5)
WBC: 3.6 10*3/uL — ABNORMAL LOW (ref 4.0–10.5)

## 2017-06-13 LAB — TYPE AND SCREEN
ABO/RH(D): A POS
Antibody Screen: NEGATIVE

## 2017-06-13 LAB — SURGICAL PCR SCREEN
MRSA, PCR: INVALID — AB
Staphylococcus aureus: INVALID — AB

## 2017-06-13 LAB — APTT: aPTT: 21 seconds — ABNORMAL LOW (ref 24–36)

## 2017-06-13 NOTE — Telephone Encounter (Signed)
Received massage from patient regarding lab results. 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 [name].  Patient states had preadmission testing done this AM, and asked if I would review her lab results with her. Explained lab results from this AM to patient.  All questions answered and concerns addressed. Patient conveys understanding and agrees with plan.  Bea Graff Louk, PA-C 06/13/2017, 3:06 PM

## 2017-06-13 NOTE — Progress Notes (Signed)
PATIENT STATED SHE THOUGHT DR. [name] WAS WORKING ON LEFT SIDE.  SPOKE WITH ALLI LOUK PA WHO STATED SHE WILL COME SPEAK WITH PATIENT IN PAT.  ALLI CONFIRMED WITH DR. [name] THAT THEY ARE WORKING ON RIGHT SIDE AND EXPLAINED THIS TO THE PATIENT.

## 2017-06-14 LAB — MRSA CULTURE: Culture: NOT DETECTED

## 2017-06-19 ENCOUNTER — Encounter (HOSPITAL_COMMUNITY): Payer: Self-pay | Admitting: Certified Registered"

## 2017-06-19 ENCOUNTER — Other Ambulatory Visit: Payer: Self-pay | Admitting: Radiology

## 2017-06-19 ENCOUNTER — Other Ambulatory Visit (HOSPITAL_COMMUNITY): Payer: Self-pay | Admitting: Radiology

## 2017-06-19 DIAGNOSIS — I729 Aneurysm of unspecified site: Secondary | ICD-10-CM

## 2017-06-19 DIAGNOSIS — Z01812 Encounter for preprocedural laboratory examination: Secondary | ICD-10-CM | POA: Diagnosis not present

## 2017-06-19 LAB — PLATELET INHIBITION P2Y12: PLATELET FUNCTION P2Y12: 298 [PRU] (ref 194–418)

## 2017-06-20 ENCOUNTER — Encounter (HOSPITAL_COMMUNITY): Admission: RE | Payer: Self-pay | Source: Ambulatory Visit

## 2017-06-20 ENCOUNTER — Inpatient Hospital Stay (HOSPITAL_COMMUNITY): Admission: RE | Admit: 2017-06-20 | Payer: Medicare Other | Source: Ambulatory Visit | Admitting: Vascular Surgery

## 2017-06-20 ENCOUNTER — Ambulatory Visit (HOSPITAL_COMMUNITY): Admission: RE | Admit: 2017-06-20 | Payer: Medicare Other | Source: Ambulatory Visit

## 2017-06-20 SURGERY — EVACUATION HEMATOMA
Anesthesia: General | Laterality: Right

## 2017-06-20 SURGERY — IR WITH ANESTHESIA
Anesthesia: General

## 2017-06-20 SURGERY — ENDARTERECTOMY, CAROTID
Anesthesia: General | Laterality: Right

## 2017-07-03 ENCOUNTER — Other Ambulatory Visit: Payer: Self-pay | Admitting: *Deleted

## 2017-08-02 NOTE — Telephone Encounter (Signed)
Signing past encounter of attempted phone call 

## 2017-08-06 ENCOUNTER — Telehealth: Payer: Self-pay | Admitting: Student

## 2017-08-06 NOTE — Telephone Encounter (Signed)
Received message from patient regarding medication use. Attempted to call patient on multiple numbers with no answer. Will attempt to call again tomorrow.  Bea Graff Louk, PA-C 08/06/2017, 4:26 PM

## 2017-08-07 ENCOUNTER — Telehealth: Payer: Self-pay | Admitting: Student

## 2017-08-07 NOTE — Telephone Encounter (Signed)
Received message from patient regarding Plavix use. Patient is prescribed Plavix 75 mg taken once daily.  Patient states that she picked up her Plavix prescription and her pills are "not the same pills" as she used to take. States these are pink pills and last time they were white. Informed patient that these pills can come in either color, depending on manufacturer. Informed patient that if she is unsure about the identification of pills, that she should go back to her pharmacist and make sure she is taking the correct medication.  Instructed patient to continue taking Plavix 75 mg once daily.  All questions answered and concerns addressed. Patient conveys understanding and agrees with plan.  Bea Graff Jakob Kimberlin, PA-C 08/07/2017, 10:20 AM

## 2017-08-10 ENCOUNTER — Other Ambulatory Visit: Payer: Self-pay | Admitting: *Deleted

## 2017-08-13 NOTE — Pre-Procedure Instructions (Signed)
MARDENE LESSIG  08/13/2017      [facility] - [place], [place] - [place] [address] Phone: [phone] Fax: [phone]  [facility] [place], [place] [address] [place] [address] Phone: [phone] Fax: [phone]    Your procedure is scheduled on August 22, 2017  Report to [facility] Admitting at 530 AM.  Call this number if you have problems the morning of surgery:  [phone]   Remember:  Do not eat or drink after midnight.    Take these medicines the morning of surgery with A SIP OF WATER  Albuterol inhaler-if needed (bring inhaler with you) Metoprolol (Toprol XL)  Follow your surgeon's instructions on how to take Aspirin and Plavix prior to surgery.  7 days prior to surgery STOP taking any Aleve, Naproxen, Ibuprofen, Motrin, Advil, Goody's, BC's, all herbal medications, fish oil, and all vitamins    Do not wear jewelry, make-up or nail polish.  Do not wear lotions, powders, or perfumes, or deodorant.  Do not shave 48 hours prior to surgery.   Do not bring valuables to the hospital.  [facility] is not responsible for any belongings or valuables.  Contacts, dentures or bridgework may not be worn into surgery.  Leave your suitcase in the car.  After surgery it may be brought to your room.  For patients admitted to the hospital, discharge time will be determined by your treatment team.  Patients discharged the day of surgery will not be allowed to drive home.    [place]- Preparing For Surgery  Before surgery, you can play an important role. Because skin is not sterile, your skin needs to be as free of germs as possible. You can reduce the number of germs on your skin by washing with CHG (chlorahexidine gluconate) Soap before surgery.  CHG is an antiseptic cleaner which kills germs and bonds with the skin to continue killing germs even  after washing.    Oral Hygiene is also important to reduce your risk of infection.  Remember - BRUSH YOUR TEETH THE MORNING OF SURGERY WITH YOUR REGULAR TOOTHPASTE  Please do not use if you have an allergy to CHG or antibacterial soaps. If your skin becomes reddened/irritated stop using the CHG.  Do not shave (including legs and underarms) for at least 48 hours prior to first CHG shower. It is OK to shave your face.  Please follow these instructions carefully.   1. Shower the NIGHT BEFORE SURGERY and the MORNING OF SURGERY with CHG.   2. If you chose to wash your hair, wash your hair first as usual with your normal shampoo.  3. After you shampoo, rinse your hair and body thoroughly to remove the shampoo.  4. Use CHG as you would any other liquid soap. You can apply CHG directly to the skin and wash gently with a scrungie or a clean washcloth.   5. Apply the CHG Soap to your body ONLY FROM THE NECK DOWN.  Do not use on open wounds or open sores. Avoid contact with your eyes, ears, mouth and genitals (private parts). Wash Face and genitals (private parts)  with your normal soap.  6. Wash thoroughly, paying special attention to the area where your surgery will be performed.  7. Thoroughly rinse your body with warm water from the neck down.  8. DO NOT shower/wash with your  normal soap after using and rinsing off the CHG Soap.  9. Pat yourself dry with a CLEAN TOWEL.  10. Wear CLEAN PAJAMAS to bed the night before surgery, wear comfortable clothes the morning of surgery  11. Place CLEAN SHEETS on your bed the night of your first shower and DO NOT SLEEP WITH PETS.  Day of Surgery:  Do not apply any deodorants/lotions.  Please wear clean clothes to the hospital/surgery center.   Remember to brush your teeth WITH YOUR REGULAR TOOTHPASTE.  Please read over the following fact sheets that you were given. Pain Booklet, Coughing and Deep Breathing, MRSA Information and Surgical Site  Infection Prevention

## 2017-08-13 NOTE — Progress Notes (Addendum)
PCP: [name], MD  Cardiologist: Ena Dawley, MD  EKG: 02/26/17 in EPIC  Stress test:  5+ years ago  ECHO: 05/04/14 in EPIC  Cardiac Cath: 1996-in pt history  Chest x-ray: pt denies, no recent respiratory infections/complications

## 2017-08-14 ENCOUNTER — Encounter (HOSPITAL_COMMUNITY)
Admission: RE | Admit: 2017-08-14 | Discharge: 2017-08-14 | Disposition: A | Discharge status: Home / Self Care | Payer: Medicare Other | Source: Ambulatory Visit | Attending: Interventional Radiology | Admitting: Interventional Radiology

## 2017-08-14 ENCOUNTER — Other Ambulatory Visit: Payer: Self-pay | Admitting: *Deleted

## 2017-08-14 ENCOUNTER — Telehealth: Payer: Self-pay | Admitting: *Deleted

## 2017-08-14 ENCOUNTER — Other Ambulatory Visit: Payer: Self-pay

## 2017-08-14 ENCOUNTER — Encounter (HOSPITAL_COMMUNITY): Payer: Self-pay

## 2017-08-14 ENCOUNTER — Telehealth: Payer: Self-pay | Admitting: Student

## 2017-08-14 DIAGNOSIS — Z79899 Other long term (current) drug therapy: Secondary | ICD-10-CM | POA: Insufficient documentation

## 2017-08-14 DIAGNOSIS — I10 Essential (primary) hypertension: Secondary | ICD-10-CM | POA: Insufficient documentation

## 2017-08-14 DIAGNOSIS — J449 Chronic obstructive pulmonary disease, unspecified: Secondary | ICD-10-CM | POA: Insufficient documentation

## 2017-08-14 DIAGNOSIS — I671 Cerebral aneurysm, nonruptured: Secondary | ICD-10-CM | POA: Insufficient documentation

## 2017-08-14 DIAGNOSIS — Z01812 Encounter for preprocedural laboratory examination: Secondary | ICD-10-CM | POA: Diagnosis not present

## 2017-08-14 DIAGNOSIS — Z8673 Personal history of transient ischemic attack (TIA), and cerebral infarction without residual deficits: Secondary | ICD-10-CM | POA: Diagnosis not present

## 2017-08-14 LAB — BLOOD GAS, ARTERIAL
Acid-Base Excess: 1.3 mmol/L (ref 0.0–2.0)
BICARBONATE: 25.1 mmol/L (ref 20.0–28.0)
DRAWN BY: 470591
FIO2: 21
O2 Saturation: 97 %
PH ART: 7.438 (ref 7.350–7.450)
PO2 ART: 93.3 mmHg (ref 83.0–108.0)
Patient temperature: 98.6
pCO2 arterial: 37.7 mmHg (ref 32.0–48.0)

## 2017-08-14 LAB — COMPREHENSIVE METABOLIC PANEL
ALT: 17 U/L (ref 0–44)
ANION GAP: 10 (ref 5–15)
AST: 25 U/L (ref 15–41)
Albumin: 3.4 g/dL — ABNORMAL LOW (ref 3.5–5.0)
Alkaline Phosphatase: 39 U/L (ref 38–126)
BUN: 7 mg/dL — AB (ref 8–23)
CALCIUM: 8.7 mg/dL — AB (ref 8.9–10.3)
CO2: 24 mmol/L (ref 22–32)
CREATININE: 0.8 mg/dL (ref 0.44–1.00)
Chloride: 104 mmol/L (ref 98–111)
Glucose, Bld: 93 mg/dL (ref 70–99)
Potassium: 4.2 mmol/L (ref 3.5–5.1)
SODIUM: 138 mmol/L (ref 135–145)
TOTAL PROTEIN: 6.5 g/dL (ref 6.5–8.1)
Total Bilirubin: 0.9 mg/dL (ref 0.3–1.2)

## 2017-08-14 LAB — URINALYSIS, ROUTINE W REFLEX MICROSCOPIC
BILIRUBIN URINE: NEGATIVE
GLUCOSE, UA: NEGATIVE mg/dL
KETONES UR: NEGATIVE mg/dL
Leukocytes, UA: NEGATIVE
NITRITE: NEGATIVE
PH: 5 (ref 5.0–8.0)
Protein, ur: NEGATIVE mg/dL
SPECIFIC GRAVITY, URINE: 1.017 (ref 1.005–1.030)

## 2017-08-14 LAB — CBC
HCT: 33.5 % — ABNORMAL LOW (ref 36.0–46.0)
Hemoglobin: 10.5 g/dL — ABNORMAL LOW (ref 12.0–15.0)
MCH: 27.6 pg (ref 26.0–34.0)
MCHC: 31.3 g/dL (ref 30.0–36.0)
MCV: 88.2 fL (ref 78.0–100.0)
PLATELETS: 105 10*3/uL — AB (ref 150–400)
RBC: 3.8 MIL/uL — ABNORMAL LOW (ref 3.87–5.11)
RDW: 15.2 % (ref 11.5–15.5)
WBC: 3.6 10*3/uL — ABNORMAL LOW (ref 4.0–10.5)

## 2017-08-14 LAB — PROTIME-INR
INR: 0.98
Prothrombin Time: 12.8 seconds (ref 11.4–15.2)

## 2017-08-14 LAB — SURGICAL PCR SCREEN
MRSA, PCR: NEGATIVE
STAPHYLOCOCCUS AUREUS: NEGATIVE

## 2017-08-14 LAB — APTT: aPTT: 22 seconds — ABNORMAL LOW (ref 24–36)

## 2017-08-14 LAB — PLATELET INHIBITION P2Y12: PLATELET FUNCTION P2Y12: 212 [PRU] (ref 194–418)

## 2017-08-14 LAB — TYPE AND SCREEN
ABO/RH(D): A POS
Antibody Screen: NEGATIVE

## 2017-08-14 MED ORDER — CIPROFLOXACIN HCL 500 MG PO TABS: 500.0000 mg | ORAL_TABLET | Freq: Two times a day (BID) | ORAL | 0 refills | Status: DC

## 2017-08-14 NOTE — H&P (View-Only) (Signed)
Anesthesia Chart Review:  Case:  [id number] Date/Time:  08/22/17 0715   Procedure:  EXPOSURE CAROTID ARTERY FOR DEVESHWAR SURGERY (Right )   Anesthesia type:  General   Pre-op diagnosis:  brain aneurysm   Location:  [facility] ROOM 7 / [facility] OR   Surgeon:  Elam Dutch, MD      DISCUSSION: 72 year old Lawson for above procedure. She is s/p similar procedure on 08/12/14 when she underwent endovascular treatment of a RICA aneurysm and 10/Jeanne/2016 when she underwent left ACA aneurysm embolization. She was kept intubated post-procedure for airway protection since she had a neck incision and was on anti-platelet therapy with at least short term heparin post-operatively. Per pulm notes she had VDRF and was extubated uneventfully on POD 3.  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 She developed hoarseness and would have intermittent periods of SOB after her 04/2014 CVA. Reportedly she was told that these symptoms could be related to her CVA and should improve over time.She has chronic BLE edema (LLE since childhood after sustaining frostbite at age 8 and RLE following her 54 CVA),  left ACA aneurysm embolization 11/08/2014  Per cardiology notes "She has a history of prior coronary artery disease and 16 years ago Dr. Ilda Foil did cardiac catheterization and angioplasty but no stent. We don't have those records. The patient has had several subsequent Cardiolite stress tests which have been normal but have been poorly tolerated by the patient and she does not want any further nuclear stress tests. She does have a chronically abnormal EKG with anterolateral T wave inversions. She denies any symptoms of angina or dyspnea since her last OV."  She has previously  tolerated similar procedures and has had no significant change in her medical hx. Anticipate she can proceed as scheduled barring acute status change.  VS: BP (!) 143/70   Pulse 60   Temp 36.9 C   Resp 20   Ht 5\' 1"  (1.549 m)   Wt 169 lb 12.8 oz (77 kg)   SpO2 98%   BMI 32.08 kg/m   PROVIDERS: [name], MD is PCP  Ena Dawley, MD is Cardiologist last seen 02/26/2017, stable at that time instructed to f/u in one year.  Antony Contras, MD is Neurologist last seen 11/27/2016   LABS: Labs reviewed: Acceptable for surgery. (all labs ordered are listed, but only abnormal results are displayed)  Labs Reviewed  APTT - Abnormal; Notable for the following components:      Result Value   aPTT 22 (*)    All other components within normal limits  CBC - Abnormal; Notable for the following components:   WBC 3.6 (*)    RBC 3.80 (*)    Hemoglobin 10.5 (*)    HCT 33.5 (*)    Platelets 105 (*)    All other components within normal limits  COMPREHENSIVE METABOLIC PANEL - Abnormal; Notable for the following components:   BUN 7 (*)    Calcium 8.7 (*)    Albumin 3.4 (*)    All other components within normal limits  URINALYSIS, ROUTINE W REFLEX MICROSCOPIC - Abnormal; Notable for the following components:   Color, Urine AMBER (*)    APPearance CLOUDY (*)    Hgb urine dipstick SMALL (*)    Bacteria, UA MANY (*)  All other components within normal limits  SURGICAL PCR SCREEN  PLATELET INHIBITION P2Y12  PROTIME-INR  BLOOD GAS, ARTERIAL  TYPE AND SCREEN     IMAGES: CHEST  2 VIEW 03/11/2017  COMPARISON:  11/19/2014 and 11/22/2014  FINDINGS: Lungs are adequately inflated without focal consolidation or effusion. There is mild stable cardiomegaly. There is minimal calcified plaque over the thoracic aorta. There are mild degenerative changes of the spine.  IMPRESSION: No active cardiopulmonary disease.  Mild stable cardiomegaly.  EKG: 02/26/2017: Sinus bradycardia  with sinus arrhythmia  CV: Carotid US 03/08/2016 Summary: Findings are consistent with a 1-39 percent stenosis involving the right internal carotid artery and the left internal carotid artery. The vertebral arteries demonstrate antegrade flow.  Echo 03/13/2015: Study Conclusions  - Left ventricle: The cavity size was normal. Wall thickness was   increased in a pattern of moderate LVH. Systolic function was   normal. The estimated ejection fraction was in the range of 60%   to 65%. Wall motion was normal; there were no regional wall   motion abnormalities. Doppler parameters are consistent with   abnormal left ventricular relaxation (grade 1 diastolic   dysfunction).   Past Medical History:  Diagnosis Date  . Cancer (HCC)    right leg skin   . COPD (chronic obstructive pulmonary disease) ([place])   . Coronary artery disease 1996   Known with prior mild lesion of LAD demonstrated by Cardiac Catheterization in 1996  . Edema of foot    She has a history of chronic edema of the left dated back to age 64 when she suufered severe frostbite playing  on the snow as a child  . Hypertension   . PAC (premature atrial contraction)   . Pancreatitis    x2  . PONV (postoperative nausea and vomiting)    problems waking up last time 4/16 only time  . 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  Marland Kitchen Shortness of breath dyspnea    since stroke 2 months ago -  . 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 TIA (transient ischemic attack)    She was hospitalized 04-23-09 through 04-27-09 for involving right side of the body  . Tobacco abuse    Ongoing   . Ventricular hypertrophy 04/2009   LVH with diastolic dysfunction by echo. Has normal EF.    Past Surgical History:  Procedure Laterality Date  . ABDOMINAL HYSTERECTOMY    .  APPENDECTOMY    . CARDIAC CATHETERIZATION  1996   Mild CAD with vasospasm  . CARDIOVASCULAR STRESS TEST  12-02-2001   EF 70%  . CHOLECYSTECTOMY N/A 03/09/2016   Procedure: LAPAROSCOPIC CHOLECYSTECTOMY WITH INTRAOPERATIVE CHOLANGIOGRAM;  Surgeon: Georganna Skeans, MD;  Location: [place];  Service: General;  Laterality: N/A;  . ENDARTERECTOMY Right 08/12/2014   Procedure: RIGHT  COMMON CAROTID ARTERY EXPOSURE FOR INTERVENTIONAL RADIOLOGY PROCEDURE BY DR.DEVASHWAR,Insertion 6 FR sheath;  Surgeon: Elam Dutch, MD;  Location: [place];  Service: Vascular;  Laterality: Right;  . ENDARTERECTOMY Right 08/12/2014   Procedure:  CAROTID  EXPOSURE CLOSURE RIGHT NECK, REPAIR RIGHT COMMON CAROTID ARTERY;  Surgeon: Elam Dutch, MD;  Location: [place];  Service: Vascular;  Laterality: Right;  . ENDARTERECTOMY Left 10/Jeanne/2016   Procedure: CAROTID EXPOSURE;  Surgeon: Jessy Oto  Fields, MD;  Location: [place];  Service: Vascular;  Laterality: Left;  . ENDARTERECTOMY Left 10/Jeanne/2016   Procedure: CLOSURE CAROTID;  Surgeon: Elam Dutch, MD;  Location: [place];  Service: Vascular;  Laterality: Left;  . IR RADIOLOGIST EVAL & MGMT  06/26/2016  . IR RADIOLOGIST EVAL & MGMT  05/08/2017  . LAPAROSCOPY    . NECK SURGERY  50 yrs ago   Left side tumor  . RADIOLOGY WITH ANESTHESIA N/A 05/25/2014   Procedure: RADIOLOGY WITH ANESTHESIA;  Surgeon: [name], MD;  Location: [place];  Service: Radiology;  Laterality: N/A;  . RADIOLOGY WITH ANESTHESIA N/A 08/12/2014   Procedure: RADIOLOGY WITH ANESTHESIA;  Surgeon: [name], MD;  Location: [place];  Service: Radiology;  Laterality: N/A;  . RADIOLOGY WITH ANESTHESIA N/A 03/15/2015   Procedure: MRI OF BRAIN WITHOUT CONTRAST;  Surgeon: Medication Radiologist, MD;  Location: [place];  Service: Radiology;  Laterality: N/A;  DR. TAT/MRI  . US ECHOCARDIOGRAPHY  04-26-2009   EF 65-70%  . 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 albuterol (PROVENTIL  HFA;VENTOLIN HFA) 108 (90 BASE) MCG/ACT inhaler  . aspirin EC 81 MG tablet  . ciprofloxacin (CIPRO) 500 MG tablet  . clopidogrel (PLAVIX) 75 MG tablet  . lisinopril-hydrochlorothiazide (PRINZIDE,ZESTORETIC) 20-12.5 MG tablet  . TOPROL XL 50 MG 24 hr tablet   No current facility-administered medications for this encounter.     Wynonia Musty [facility] Short Stay Center/Anesthesiology Phone [phone] 08/14/2017 4:03 PM

## 2017-08-14 NOTE — Progress Notes (Signed)
Left a confidenital message for the clinical staff at Dr. Nona Dell office informing them of abnormal urinalysis with my name and call back number for questions.

## 2017-08-14 NOTE — Telephone Encounter (Signed)
Patient instructed to pick up and start antibiotic and drink plenty of water to to positive bacteria in urine. Per Raquel Sarna at [facility] at [facility].

## 2017-08-14 NOTE — Telephone Encounter (Signed)
Called patient regarding P2Y12 value from this AM- 212 PRU. Patient is scheduled for image-guided cerebral angiogram with possible right ICA PCOM aneurysm embolization 08/22/2017 with Dr. [name].  Informed patient of P2Y12 level. Informed patient that based on this lab value, per Dr. [name], we are going to switch up her medications. Instructed patient to discontinue Plavix use. Instructed patient to begin taking Brilinta 90 mg twice daily. Instructed patient to continue taking Aspirin 81 mg once daily. Patient states that she does not want to begin taking Brilinta, and asks that she try taking Plavix 75 mg twice daily.  Discussed case with Dr. [name].  Instructed patient, per Dr. [name], that she is to begin taking Plavix 75 mg twice daily. Instructed patient to continue taking Aspirin 81 mg once daily. Instructed patient to come to [facility] Friday 08/17/2017 for repeat P2Y12. Informed patient that if this value is not therapeutic, she will need to discontinue Plavix and begin taking Brilinta 90 mg twice daily, or the procedure will not occur 08/22/2017.  All questions answered and concerns addressed. Patient conveys understanding and agrees with plan.  Jeanne Graff Tera Pellicane, PA-C 08/14/2017, 11:19 AM

## 2017-08-14 NOTE — Progress Notes (Signed)
Anesthesia Chart Review:  Case:  [id number] Date/Time:  08/22/17 0715   Procedure:  EXPOSURE CAROTID ARTERY FOR DEVESHWAR SURGERY (Right )   Anesthesia type:  General   Pre-op diagnosis:  brain aneurysm   Location:  [facility] OR ROOM 10 / [facility] OR   Surgeon:  Elam Dutch, MD      DISCUSSION: 72 year old female for above procedure. She is s/p similar procedure on 08/12/14 when she underwent endovascular treatment of a RICA aneurysm and 11/18/2014 when she underwent left ACA aneurysm embolization. She was kept intubated post-procedure for airway protection since she had a neck incision and was on anti-platelet therapy with at least short term heparin post-operatively. Per pulm notes she had VDRF and was extubated uneventfully on POD 3.  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 She developed hoarseness and would have intermittent periods of SOB after her 04/2014 CVA. 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 Ilda Foil did cardiac catheterization and angioplasty but no stent. We don't have those records. The patient has had several subsequent Cardiolite stress tests which have been normal but have been poorly tolerated by the patient and she does not want any further nuclear stress tests. She does have a chronically abnormal EKG with anterolateral T wave inversions. She denies any symptoms of angina or dyspnea since her last OV."  She has previously  tolerated similar procedures and has had no significant change in her medical hx. Anticipate she can proceed as scheduled barring acute status change.  VS: BP (!) 143/70   Pulse 60   Temp 36.9 C   Resp 20   Ht 5\' 1"  (1.549 m)   Wt 169 lb 12.8 oz (77 kg)   SpO2 98%   BMI 32.08 kg/m   PROVIDERS: [name], MD is PCP  Ena Dawley, MD is Cardiologist last seen 02/26/2017, stable at that time instructed to f/u in one year.  Antony Contras, MD is Neurologist last seen 11/27/2016   LABS: Labs reviewed: Acceptable for surgery. (all labs ordered are listed, but only abnormal results are displayed)  Labs Reviewed  APTT - Abnormal; Notable for the following components:      Result Value   aPTT 22 (*)    All other components within normal limits  CBC - Abnormal; Notable for the following components:   WBC 3.6 (*)    RBC 3.80 (*)    Hemoglobin 10.5 (*)    HCT 33.5 (*)    Platelets 105 (*)    All other components within normal limits  COMPREHENSIVE METABOLIC PANEL - Abnormal; Notable for the following components:   BUN 7 (*)    Calcium 8.7 (*)    Albumin 3.4 (*)    All other components within normal limits  URINALYSIS, ROUTINE W REFLEX MICROSCOPIC - Abnormal; Notable for the following components:   Color, Urine AMBER (*)    APPearance CLOUDY (*)    Hgb urine dipstick SMALL (*)    Bacteria, UA MANY (*)  All other components within normal limits  SURGICAL PCR SCREEN  PLATELET INHIBITION P2Y12  PROTIME-INR  BLOOD GAS, ARTERIAL  TYPE AND SCREEN     IMAGES: CHEST  2 VIEW 03/11/2017  COMPARISON:  11/19/2014 and 11/22/2014  FINDINGS: Lungs are adequately inflated without focal consolidation or effusion. There is mild stable cardiomegaly. There is minimal calcified plaque over the thoracic aorta. There are mild degenerative changes of the spine.  IMPRESSION: No active cardiopulmonary disease.  Mild stable cardiomegaly.  EKG: 02/26/2017: Sinus bradycardia  with sinus arrhythmia  CV: Carotid US 03/08/2016 Summary: Findings are consistent with a 1-39 percent stenosis involving the right internal carotid artery and the left internal carotid artery. The vertebral arteries demonstrate antegrade flow.  Echo 03/13/2015: Study Conclusions  - Left ventricle: The cavity size was normal. Wall thickness was   increased in a pattern of moderate LVH. Systolic function was   normal. The estimated ejection fraction was in the range of 60%   to 65%. Wall motion was normal; there were no regional wall   motion abnormalities. Doppler parameters are consistent with   abnormal left ventricular relaxation (grade 1 diastolic   dysfunction).   Past Medical History:  Diagnosis Date  . Cancer (HCC)    right leg skin   . COPD (chronic obstructive pulmonary disease) ([place])   . Coronary artery disease 1996   Known with prior mild lesion of LAD demonstrated by Cardiac Catheterization in 1996  . Edema of foot    She has a history of chronic edema of the left dated back to age 4 when she suufered severe frostbite playing  on the snow as a child  . Hypertension   . PAC (premature atrial contraction)   . Pancreatitis    x2  . PONV (postoperative nausea and vomiting)    problems waking up last time 4/16 only time  . 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  Marland Kitchen Shortness of breath dyspnea    since stroke 2 months ago -  . 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 TIA (transient ischemic attack)    She was hospitalized 04-23-09 through 04-27-09 for involving right side of the body  . Tobacco abuse    Ongoing   . Ventricular hypertrophy 04/2009   LVH with diastolic dysfunction by echo. Has normal EF.    Past Surgical History:  Procedure Laterality Date  . ABDOMINAL HYSTERECTOMY    .  APPENDECTOMY    . CARDIAC CATHETERIZATION  1996   Mild CAD with vasospasm  . CARDIOVASCULAR STRESS TEST  12-02-2001   EF 70%  . CHOLECYSTECTOMY N/A 03/09/2016   Procedure: LAPAROSCOPIC CHOLECYSTECTOMY WITH INTRAOPERATIVE CHOLANGIOGRAM;  Surgeon: Georganna Skeans, MD;  Location: [place];  Service: General;  Laterality: N/A;  . ENDARTERECTOMY Right 08/12/2014   Procedure: RIGHT  COMMON CAROTID ARTERY EXPOSURE FOR INTERVENTIONAL RADIOLOGY PROCEDURE BY DR.DEVASHWAR,Insertion 6 FR sheath;  Surgeon: Elam Dutch, MD;  Location: [place];  Service: Vascular;  Laterality: Right;  . ENDARTERECTOMY Right 08/12/2014   Procedure:  CAROTID  EXPOSURE CLOSURE RIGHT NECK, REPAIR RIGHT COMMON CAROTID ARTERY;  Surgeon: Elam Dutch, MD;  Location: [place];  Service: Vascular;  Laterality: Right;  . ENDARTERECTOMY Left 11/18/2014   Procedure: CAROTID EXPOSURE;  Surgeon: Jessy Oto  Fields, MD;  Location: [place];  Service: Vascular;  Laterality: Left;  . ENDARTERECTOMY Left 11/18/2014   Procedure: CLOSURE CAROTID;  Surgeon: Elam Dutch, MD;  Location: [place];  Service: Vascular;  Laterality: Left;  . IR RADIOLOGIST EVAL & MGMT  06/26/2016  . IR RADIOLOGIST EVAL & MGMT  05/08/2017  . LAPAROSCOPY    . NECK SURGERY  50 yrs ago   Left side tumor  . RADIOLOGY WITH ANESTHESIA N/A 05/25/2014   Procedure: RADIOLOGY WITH ANESTHESIA;  Surgeon: [name], MD;  Location: [place];  Service: Radiology;  Laterality: N/A;  . RADIOLOGY WITH ANESTHESIA N/A 08/12/2014   Procedure: RADIOLOGY WITH ANESTHESIA;  Surgeon: [name], MD;  Location: [place];  Service: Radiology;  Laterality: N/A;  . RADIOLOGY WITH ANESTHESIA N/A 03/15/2015   Procedure: MRI OF BRAIN WITHOUT CONTRAST;  Surgeon: Medication Radiologist, MD;  Location: [place];  Service: Radiology;  Laterality: N/A;  DR. TAT/MRI  . US ECHOCARDIOGRAPHY  04-26-2009   EF 65-70%  . 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 albuterol (PROVENTIL  HFA;VENTOLIN HFA) 108 (90 BASE) MCG/ACT inhaler  . aspirin EC 81 MG tablet  . ciprofloxacin (CIPRO) 500 MG tablet  . clopidogrel (PLAVIX) 75 MG tablet  . lisinopril-hydrochlorothiazide (PRINZIDE,ZESTORETIC) 20-12.5 MG tablet  . TOPROL XL 50 MG 24 hr tablet   No current facility-administered medications for this encounter.     Wynonia Musty [facility] Short Stay Center/Anesthesiology Phone [phone] 08/14/2017 4:03 PM

## 2017-08-15 ENCOUNTER — Telehealth (HOSPITAL_COMMUNITY): Payer: Self-pay

## 2017-08-15 NOTE — Telephone Encounter (Signed)
Pt called to let us know that she has a bladder infection and will need to take Cipro. She read on the label that if you have an aneurysm then you should not take this medication. Spoke to Dr. [name] and it is fine for her to take the Cipro to clear infection. Pt also stated that the Cipro is too big and she doesn't know how she will take this pill twice a day. She would like something lighter. I told her that she would have to call Dr. Oneida Alar office and see if there is a different medication that they could give her. She will call them and see what they can do. AW

## 2017-08-17 ENCOUNTER — Other Ambulatory Visit (HOSPITAL_COMMUNITY): Payer: Self-pay

## 2017-08-17 DIAGNOSIS — I729 Aneurysm of unspecified site: Secondary | ICD-10-CM

## 2017-08-17 LAB — PLATELET INHIBITION P2Y12: Platelet Function  P2Y12: 63 [PRU] — ABNORMAL LOW (ref 194–418)

## 2017-08-20 ENCOUNTER — Telehealth: Payer: Self-pay | Admitting: Student

## 2017-08-20 NOTE — Progress Notes (Signed)
Received patient's P2Y12 level from 08/17/2017- 63 PRU. Patient is scheduled for image-guided cerebral angiogram with possible right ICA PCOM aneurysm embolization 08/22/2017 with Dr. [name]. Patient is currently taking Plavix 75 mg twice daily and Aspirin 81 mg once daily.  Discussed results with Dr. [name]. Per Dr. [name], no indication to change medications at this time. Patient to continue taking Plavix 75 mg twice daily and Aspirin 81 mg once daily.  Bea Graff Macario Shear, PA-C 08/20/2017, 12:05 PM

## 2017-08-21 ENCOUNTER — Other Ambulatory Visit: Payer: Self-pay | Admitting: Student

## 2017-08-22 ENCOUNTER — Ambulatory Visit (HOSPITAL_COMMUNITY)
Admission: RE | Admit: 2017-08-22 | Discharge: 2017-08-22 | Disposition: A | Discharge status: Home / Self Care | Payer: Medicare Other | Source: Ambulatory Visit | Attending: Interventional Radiology | Admitting: Interventional Radiology

## 2017-08-22 ENCOUNTER — Inpatient Hospital Stay (HOSPITAL_COMMUNITY): Payer: Medicare Other | Admitting: Anesthesiology

## 2017-08-22 ENCOUNTER — Encounter (HOSPITAL_COMMUNITY): Payer: Self-pay | Admitting: *Deleted

## 2017-08-22 ENCOUNTER — Other Ambulatory Visit: Payer: Self-pay | Admitting: Radiology

## 2017-08-22 ENCOUNTER — Inpatient Hospital Stay (HOSPITAL_COMMUNITY): Payer: Medicare Other

## 2017-08-22 ENCOUNTER — Encounter (HOSPITAL_COMMUNITY)
Admission: RE | Disposition: A | Discharge status: Home / Self Care | Payer: Self-pay | Source: Home / Self Care | Attending: Interventional Radiology

## 2017-08-22 ENCOUNTER — Inpatient Hospital Stay (HOSPITAL_COMMUNITY): Payer: Medicare Other | Admitting: Certified Registered"

## 2017-08-22 ENCOUNTER — Inpatient Hospital Stay (HOSPITAL_COMMUNITY): Payer: Medicare Other | Admitting: Physician Assistant

## 2017-08-22 ENCOUNTER — Inpatient Hospital Stay (HOSPITAL_COMMUNITY)
Admission: RE | Admit: 2017-08-22 | Discharge: 2017-08-24 | DRG: 027 | Disposition: A | Discharge status: Home / Self Care | Payer: Medicare Other | Attending: Interventional Radiology | Admitting: Interventional Radiology

## 2017-08-22 ENCOUNTER — Telehealth (HOSPITAL_COMMUNITY): Payer: Self-pay | Admitting: *Deleted

## 2017-08-22 ENCOUNTER — Other Ambulatory Visit: Payer: Self-pay

## 2017-08-22 DIAGNOSIS — Z7902 Long term (current) use of antithrombotics/antiplatelets: Secondary | ICD-10-CM

## 2017-08-22 DIAGNOSIS — R0602 Shortness of breath: Secondary | ICD-10-CM | POA: Diagnosis not present

## 2017-08-22 DIAGNOSIS — J449 Chronic obstructive pulmonary disease, unspecified: Secondary | ICD-10-CM | POA: Diagnosis present

## 2017-08-22 DIAGNOSIS — Z825 Family history of asthma and other chronic lower respiratory diseases: Secondary | ICD-10-CM | POA: Diagnosis not present

## 2017-08-22 DIAGNOSIS — Z792 Long term (current) use of antibiotics: Secondary | ICD-10-CM

## 2017-08-22 DIAGNOSIS — Z9889 Other specified postprocedural states: Secondary | ICD-10-CM | POA: Diagnosis not present

## 2017-08-22 DIAGNOSIS — Z833 Family history of diabetes mellitus: Secondary | ICD-10-CM

## 2017-08-22 DIAGNOSIS — R600 Localized edema: Secondary | ICD-10-CM | POA: Diagnosis present

## 2017-08-22 DIAGNOSIS — I119 Hypertensive heart disease without heart failure: Secondary | ICD-10-CM | POA: Diagnosis present

## 2017-08-22 DIAGNOSIS — Z87891 Personal history of nicotine dependence: Secondary | ICD-10-CM | POA: Diagnosis not present

## 2017-08-22 DIAGNOSIS — J9601 Acute respiratory failure with hypoxia: Secondary | ICD-10-CM

## 2017-08-22 DIAGNOSIS — Z885 Allergy status to narcotic agent status: Secondary | ICD-10-CM | POA: Diagnosis not present

## 2017-08-22 DIAGNOSIS — Z79899 Other long term (current) drug therapy: Secondary | ICD-10-CM

## 2017-08-22 DIAGNOSIS — Z882 Allergy status to sulfonamides status: Secondary | ICD-10-CM

## 2017-08-22 DIAGNOSIS — I2510 Atherosclerotic heart disease of native coronary artery without angina pectoris: Secondary | ICD-10-CM | POA: Diagnosis present

## 2017-08-22 DIAGNOSIS — Z8249 Family history of ischemic heart disease and other diseases of the circulatory system: Secondary | ICD-10-CM | POA: Diagnosis not present

## 2017-08-22 DIAGNOSIS — Z7982 Long term (current) use of aspirin: Secondary | ICD-10-CM | POA: Diagnosis not present

## 2017-08-22 DIAGNOSIS — Z823 Family history of stroke: Secondary | ICD-10-CM

## 2017-08-22 DIAGNOSIS — I959 Hypotension, unspecified: Secondary | ICD-10-CM | POA: Diagnosis not present

## 2017-08-22 DIAGNOSIS — R232 Flushing: Secondary | ICD-10-CM | POA: Diagnosis not present

## 2017-08-22 DIAGNOSIS — Z888 Allergy status to other drugs, medicaments and biological substances status: Secondary | ICD-10-CM | POA: Diagnosis not present

## 2017-08-22 DIAGNOSIS — Z9049 Acquired absence of other specified parts of digestive tract: Secondary | ICD-10-CM | POA: Diagnosis not present

## 2017-08-22 DIAGNOSIS — Z4659 Encounter for fitting and adjustment of other gastrointestinal appliance and device: Secondary | ICD-10-CM

## 2017-08-22 DIAGNOSIS — Z85828 Personal history of other malignant neoplasm of skin: Secondary | ICD-10-CM

## 2017-08-22 DIAGNOSIS — I671 Cerebral aneurysm, nonruptured: Principal | ICD-10-CM | POA: Diagnosis present

## 2017-08-22 DIAGNOSIS — Z8673 Personal history of transient ischemic attack (TIA), and cerebral infarction without residual deficits: Secondary | ICD-10-CM

## 2017-08-22 DIAGNOSIS — Z419 Encounter for procedure for purposes other than remedying health state, unspecified: Secondary | ICD-10-CM

## 2017-08-22 DIAGNOSIS — Z90710 Acquired absence of both cervix and uterus: Secondary | ICD-10-CM | POA: Diagnosis not present

## 2017-08-22 HISTORY — PX: IR TRANSCATH/EMBOLIZ: IMG695

## 2017-08-22 HISTORY — PX: ENDARTERECTOMY: SHX5162

## 2017-08-22 HISTORY — PX: IR ANGIO INTRA EXTRACRAN SEL INTERNAL CAROTID UNI R MOD SED: IMG5362

## 2017-08-22 HISTORY — PX: IR ANGIOGRAM FOLLOW UP STUDY: IMG697

## 2017-08-22 HISTORY — PX: WOUND EXPLORATION: SHX6188

## 2017-08-22 LAB — GLUCOSE, CAPILLARY
GLUCOSE-CAPILLARY: 132 mg/dL — AB (ref 70–99)
Glucose-Capillary: 138 mg/dL — ABNORMAL HIGH (ref 70–99)
Glucose-Capillary: 118 mg/dL — ABNORMAL HIGH (ref 70–99)

## 2017-08-22 LAB — COMPREHENSIVE METABOLIC PANEL
ALBUMIN: 2.5 g/dL — AB (ref 3.5–5.0)
ALT: 13 U/L (ref 0–44)
AST: 22 U/L (ref 15–41)
Alkaline Phosphatase: 27 U/L — ABNORMAL LOW (ref 38–126)
Anion gap: 9 (ref 5–15)
BUN: 6 mg/dL — AB (ref 8–23)
CHLORIDE: 107 mmol/L (ref 98–111)
CO2: 22 mmol/L (ref 22–32)
Calcium: 7 mg/dL — ABNORMAL LOW (ref 8.9–10.3)
Creatinine, Ser: 0.6 mg/dL (ref 0.44–1.00)
GFR calc Af Amer: >60 mL/min (ref >60)
GFR calc non Af Amer: >60 mL/min (ref >60)
GLUCOSE: 124 mg/dL — AB (ref 70–99)
Potassium: 3 mmol/L — ABNORMAL LOW (ref 3.5–5.1)
SODIUM: 138 mmol/L (ref 135–145)
Total Bilirubin: 1.2 mg/dL (ref 0.3–1.2)
Total Protein: 4.5 g/dL — ABNORMAL LOW (ref 6.5–8.1)

## 2017-08-22 LAB — CBC
HCT: 26.3 % — ABNORMAL LOW (ref 36.0–46.0)
Hemoglobin: 8.6 g/dL — ABNORMAL LOW (ref 12.0–15.0)
MCH: 28.6 pg (ref 26.0–34.0)
MCHC: 32.7 g/dL (ref 30.0–36.0)
MCV: 87.4 fL (ref 78.0–100.0)
PLATELETS: 95 10*3/uL — AB (ref 150–400)
RBC: 3.01 MIL/uL — AB (ref 3.87–5.11)
RDW: 15.2 % (ref 11.5–15.5)
WBC: 3.5 10*3/uL — AB (ref 4.0–10.5)

## 2017-08-22 LAB — POCT I-STAT 3, ART BLOOD GAS (G3+)
Acid-Base Excess: 2 mmol/L (ref 0.0–2.0)
BICARBONATE: 26.8 mmol/L (ref 20.0–28.0)
O2 Saturation: 100 %
PCO2 ART: 42.8 mmHg (ref 32.0–48.0)
PO2 ART: 309 mmHg — AB (ref 83.0–108.0)
TCO2: 28 mmol/L (ref 22–32)
pH, Arterial: 7.405 (ref 7.350–7.450)

## 2017-08-22 LAB — POCT ACTIVATED CLOTTING TIME
ACTIVATED CLOTTING TIME: 202 s
Activated Clotting Time: 153 seconds

## 2017-08-22 LAB — MAGNESIUM: MAGNESIUM: 1.7 mg/dL (ref 1.7–2.4)

## 2017-08-22 LAB — PLATELET INHIBITION P2Y12: PLATELET FUNCTION P2Y12: 118 [PRU] — AB (ref 194–418)

## 2017-08-22 LAB — TRIGLYCERIDES
TRIGLYCERIDES: 92 mg/dL (ref <150)
Triglycerides: 964 mg/dL — ABNORMAL HIGH (ref <150)

## 2017-08-22 SURGERY — ENDARTERECTOMY, CAROTID
Anesthesia: General | Site: Neck | Laterality: Right

## 2017-08-22 SURGERY — IR WITH ANESTHESIA
Anesthesia: General

## 2017-08-22 SURGERY — WOUND EXPLORATION
Anesthesia: General | Laterality: Right

## 2017-08-22 MED ORDER — LISINOPRIL 20 MG PO TABS: 20.0000 mg | ORAL_TABLET | Freq: Every day | ORAL | Status: DC

## 2017-08-22 MED ORDER — ALUM & MAG HYDROXIDE-SIMETH 200-200-20 MG/5ML PO SUSP: 15.0000 mL | ORAL | Status: DC | PRN

## 2017-08-22 MED ORDER — PANTOPRAZOLE SODIUM 40 MG PO TBEC
40.0000 mg | DELAYED_RELEASE_TABLET | Freq: Every day | ORAL | Status: DC
  Administered 2017-08-23: 40 mg via ORAL
  Filled 2017-08-22 (×2): qty 1

## 2017-08-22 MED ORDER — FENTANYL CITRATE (PF) 250 MCG/5ML IJ SOLN
INTRAMUSCULAR | Status: AC
  Filled 2017-08-22: qty 5

## 2017-08-22 MED ORDER — ASPIRIN 81 MG PO CHEW: 81.0000 mg | CHEWABLE_TABLET | Freq: Every day | ORAL | Status: DC

## 2017-08-22 MED ORDER — EPHEDRINE SULFATE 50 MG/ML IJ SOLN
INTRAMUSCULAR | Status: AC
  Filled 2017-08-22: qty 1

## 2017-08-22 MED ORDER — FENTANYL CITRATE (PF) 100 MCG/2ML IJ SOLN
50.0000 ug | INTRAMUSCULAR | Status: DC | PRN
  Administered 2017-08-23 (×3): 50 ug via INTRAVENOUS
  Filled 2017-08-22 (×3): qty 2

## 2017-08-22 MED ORDER — SODIUM CHLORIDE 0.9 % IV SOLN: INTRAVENOUS | Status: DC

## 2017-08-22 MED ORDER — CHLORHEXIDINE GLUCONATE CLOTH 2 % EX PADS: 6.0000 | MEDICATED_PAD | Freq: Once | CUTANEOUS | Status: DC

## 2017-08-22 MED ORDER — MIDAZOLAM HCL 5 MG/5ML IJ SOLN
INTRAMUSCULAR | Status: DC | PRN
  Administered 2017-08-22 (×2): 1 mg via INTRAVENOUS

## 2017-08-22 MED ORDER — CLEVIDIPINE BUTYRATE 0.5 MG/ML IV EMUL: 0.0000 mg/h | INTRAVENOUS | Status: DC

## 2017-08-22 MED ORDER — LIDOCAINE 2% (20 MG/ML) 5 ML SYRINGE
INTRAMUSCULAR | Status: AC
  Filled 2017-08-22: qty 5

## 2017-08-22 MED ORDER — PHENYLEPHRINE 40 MCG/ML (10ML) SYRINGE FOR IV PUSH (FOR BLOOD PRESSURE SUPPORT)
PREFILLED_SYRINGE | INTRAVENOUS | Status: DC | PRN
  Administered 2017-08-22 (×2): 80 ug via INTRAVENOUS

## 2017-08-22 MED ORDER — 0.9 % SODIUM CHLORIDE (POUR BTL) OPTIME
TOPICAL | Status: DC | PRN
  Administered 2017-08-22: 2000 mL

## 2017-08-22 MED ORDER — METOPROLOL SUCCINATE ER 25 MG PO TB24
25.0000 mg | ORAL_TABLET | Freq: Two times a day (BID) | ORAL | Status: DC
  Administered 2017-08-23 – 2017-08-24 (×3): 25 mg via ORAL
  Filled 2017-08-22 (×3): qty 1

## 2017-08-22 MED ORDER — ONDANSETRON HCL 4 MG/2ML IJ SOLN: 4.0000 mg | Freq: Four times a day (QID) | INTRAMUSCULAR | Status: DC | PRN

## 2017-08-22 MED ORDER — HYDROCHLOROTHIAZIDE 12.5 MG PO CAPS
12.5000 mg | ORAL_CAPSULE | Freq: Every day | ORAL | Status: DC
  Filled 2017-08-22: qty 1

## 2017-08-22 MED ORDER — DEXAMETHASONE SODIUM PHOSPHATE 10 MG/ML IJ SOLN
INTRAMUSCULAR | Status: DC | PRN
  Administered 2017-08-22: 10 mg via INTRAVENOUS

## 2017-08-22 MED ORDER — IOHEXOL 300 MG/ML  SOLN
150.0000 mL | Freq: Once | INTRAMUSCULAR | Status: AC | PRN
  Administered 2017-08-22: 70 mL via INTRA_ARTERIAL

## 2017-08-22 MED ORDER — ACETAMINOPHEN 160 MG/5ML PO SOLN: 650.0000 mg | ORAL | Status: DC | PRN

## 2017-08-22 MED ORDER — MIDAZOLAM HCL 2 MG/2ML IJ SOLN
INTRAMUSCULAR | Status: AC
  Filled 2017-08-22: qty 2

## 2017-08-22 MED ORDER — HEPARIN SODIUM (PORCINE) 1000 UNIT/ML IJ SOLN
INTRAMUSCULAR | Status: DC | PRN
  Administered 2017-08-22: 3000 [IU] via INTRAVENOUS
  Administered 2017-08-22: 2000 [IU] via INTRAVENOUS

## 2017-08-22 MED ORDER — ALBUTEROL SULFATE HFA 108 (90 BASE) MCG/ACT IN AERS: 1.0000 | INHALATION_SPRAY | Freq: Four times a day (QID) | RESPIRATORY_TRACT | Status: DC | PRN

## 2017-08-22 MED ORDER — ACETAMINOPHEN 325 MG PO TABS: 325.0000 mg | ORAL_TABLET | ORAL | Status: DC | PRN

## 2017-08-22 MED ORDER — MORPHINE SULFATE (PF) 4 MG/ML IV SOLN: 4.0000 mg | INTRAVENOUS | Status: DC | PRN

## 2017-08-22 MED ORDER — NITROGLYCERIN 1 MG/10 ML FOR IR/CATH LAB
INTRA_ARTERIAL | Status: AC
  Filled 2017-08-22: qty 10

## 2017-08-22 MED ORDER — GLYCOPYRROLATE PF 0.2 MG/ML IJ SOSY
PREFILLED_SYRINGE | INTRAMUSCULAR | Status: AC
  Filled 2017-08-22: qty 1

## 2017-08-22 MED ORDER — ACETAMINOPHEN 325 MG PO TABS: 650.0000 mg | ORAL_TABLET | ORAL | Status: DC | PRN

## 2017-08-22 MED ORDER — IPRATROPIUM-ALBUTEROL 0.5-2.5 (3) MG/3ML IN SOLN
3.0000 mL | Freq: Four times a day (QID) | RESPIRATORY_TRACT | Status: DC
  Administered 2017-08-22 – 2017-08-23 (×4): 3 mL via RESPIRATORY_TRACT
  Filled 2017-08-22 (×4): qty 3

## 2017-08-22 MED ORDER — ACETAMINOPHEN 650 MG RE SUPP: 650.0000 mg | RECTAL | Status: DC | PRN

## 2017-08-22 MED ORDER — CLOPIDOGREL BISULFATE 75 MG PO TABS
ORAL_TABLET | ORAL | Status: AC
  Administered 2017-08-22: 75 mg via ORAL
  Filled 2017-08-22: qty 1

## 2017-08-22 MED ORDER — CEFAZOLIN SODIUM-DEXTROSE 2-4 GM/100ML-% IV SOLN
INTRAVENOUS | Status: AC
Start: 2017-08-22 — End: 2017-08-22
  Filled 2017-08-22: qty 100

## 2017-08-22 MED ORDER — FENTANYL CITRATE (PF) 100 MCG/2ML IJ SOLN
INTRAMUSCULAR | Status: DC | PRN
  Administered 2017-08-22 (×2): 50 ug via INTRAVENOUS
  Administered 2017-08-22: 100 ug via INTRAVENOUS
  Administered 2017-08-22 (×2): 50 ug via INTRAVENOUS

## 2017-08-22 MED ORDER — ACETAMINOPHEN 650 MG RE SUPP: 325.0000 mg | RECTAL | Status: DC | PRN

## 2017-08-22 MED ORDER — LISINOPRIL 20 MG PO TABS
20.0000 mg | ORAL_TABLET | Freq: Every day | ORAL | Status: DC
  Filled 2017-08-22: qty 1

## 2017-08-22 MED ORDER — GLYCOPYRROLATE 0.2 MG/ML IJ SOLN
INTRAMUSCULAR | Status: DC | PRN
  Administered 2017-08-22: .2 mg via INTRAVENOUS

## 2017-08-22 MED ORDER — CEFAZOLIN SODIUM-DEXTROSE 2-4 GM/100ML-% IV SOLN
2.0000 g | INTRAVENOUS | Status: AC
  Administered 2017-08-22 (×2): 2 g via INTRAVENOUS

## 2017-08-22 MED ORDER — ROCURONIUM BROMIDE 100 MG/10ML IV SOLN
INTRAVENOUS | Status: DC | PRN
  Administered 2017-08-22 (×2): 30 mg via INTRAVENOUS
  Administered 2017-08-22 (×2): 20 mg via INTRAVENOUS
  Administered 2017-08-22: 50 mg via INTRAVENOUS
  Administered 2017-08-22: 20 mg via INTRAVENOUS

## 2017-08-22 MED ORDER — EPTIFIBATIDE 20 MG/10ML IV SOLN
INTRAVENOUS | Status: AC
  Filled 2017-08-22: qty 10

## 2017-08-22 MED ORDER — DOCUSATE SODIUM 100 MG PO CAPS
100.0000 mg | ORAL_CAPSULE | Freq: Every day | ORAL | Status: DC
  Filled 2017-08-22: qty 1

## 2017-08-22 MED ORDER — METOPROLOL SUCCINATE ER 25 MG PO TB24: 25.0000 mg | ORAL_TABLET | Freq: Two times a day (BID) | ORAL | Status: DC

## 2017-08-22 MED ORDER — SODIUM CHLORIDE 0.9 % IJ SOLN
INTRAMUSCULAR | Status: AC
  Filled 2017-08-22: qty 10

## 2017-08-22 MED ORDER — HYDROCHLOROTHIAZIDE 12.5 MG PO CAPS: 12.5000 mg | ORAL_CAPSULE | Freq: Every day | ORAL | Status: DC

## 2017-08-22 MED ORDER — LABETALOL HCL 5 MG/ML IV SOLN
INTRAVENOUS | Status: AC
  Filled 2017-08-22: qty 4

## 2017-08-22 MED ORDER — SODIUM CHLORIDE 0.9 % IV SOLN
INTRAVENOUS | Status: AC
  Filled 2017-08-22: qty 1.2

## 2017-08-22 MED ORDER — SODIUM CHLORIDE 0.9 % IV SOLN
INTRAVENOUS | Status: DC
  Administered 2017-08-22 – 2017-08-23 (×2): via INTRAVENOUS

## 2017-08-22 MED ORDER — HEPARIN (PORCINE) IN NACL 100-0.45 UNIT/ML-% IJ SOLN
550.0000 [IU]/h | INTRAMUSCULAR | Status: DC
  Administered 2017-08-22: 550 [IU]/h via INTRAVENOUS
  Filled 2017-08-22: qty 250

## 2017-08-22 MED ORDER — GUAIFENESIN-DM 100-10 MG/5ML PO SYRP: 15.0000 mL | ORAL_SOLUTION | ORAL | Status: DC | PRN

## 2017-08-22 MED ORDER — CHLORHEXIDINE GLUCONATE 0.12% ORAL RINSE (MEDLINE KIT)
15.0000 mL | Freq: Two times a day (BID) | OROMUCOSAL | Status: DC
  Administered 2017-08-22: 15 mL via OROMUCOSAL

## 2017-08-22 MED ORDER — CLOPIDOGREL BISULFATE 75 MG PO TABS
75.0000 mg | ORAL_TABLET | ORAL | Status: AC
  Administered 2017-08-22: 75 mg via ORAL

## 2017-08-22 MED ORDER — LIDOCAINE HCL (CARDIAC) PF 100 MG/5ML IV SOSY
PREFILLED_SYRINGE | INTRAVENOUS | Status: DC | PRN
  Administered 2017-08-22: 60 mg via INTRAVENOUS

## 2017-08-22 MED ORDER — ASPIRIN EC 81 MG PO TBEC: 81.0000 mg | DELAYED_RELEASE_TABLET | Freq: Every day | ORAL | Status: DC

## 2017-08-22 MED ORDER — LISINOPRIL-HYDROCHLOROTHIAZIDE 20-12.5 MG PO TABS: 1.0000 | ORAL_TABLET | Freq: Every day | ORAL | Status: DC

## 2017-08-22 MED ORDER — SODIUM CHLORIDE 0.9 % IV SOLN
INTRAVENOUS | Status: DC | PRN
  Administered 2017-08-22: 10:00:00

## 2017-08-22 MED ORDER — CEFAZOLIN SODIUM-DEXTROSE 2-4 GM/100ML-% IV SOLN
2.0000 g | Freq: Three times a day (TID) | INTRAVENOUS | Status: AC
  Administered 2017-08-22 – 2017-08-23 (×2): 2 g via INTRAVENOUS
  Filled 2017-08-22 (×2): qty 100

## 2017-08-22 MED ORDER — ONDANSETRON HCL 4 MG/2ML IJ SOLN: 4.0000 mg | Freq: Once | INTRAMUSCULAR | Status: DC | PRN

## 2017-08-22 MED ORDER — HYDRALAZINE HCL 20 MG/ML IJ SOLN: 5.0000 mg | INTRAMUSCULAR | Status: DC | PRN

## 2017-08-22 MED ORDER — CLOPIDOGREL BISULFATE 75 MG PO TABS: 75.0000 mg | ORAL_TABLET | Freq: Every morning | ORAL | Status: DC

## 2017-08-22 MED ORDER — PHENYLEPHRINE 40 MCG/ML (10ML) SYRINGE FOR IV PUSH (FOR BLOOD PRESSURE SUPPORT)
PREFILLED_SYRINGE | INTRAVENOUS | Status: AC
  Filled 2017-08-22: qty 10

## 2017-08-22 MED ORDER — CEFAZOLIN SODIUM-DEXTROSE 2-4 GM/100ML-% IV SOLN
INTRAVENOUS | Status: AC
  Filled 2017-08-22: qty 100

## 2017-08-22 MED ORDER — ROCURONIUM BROMIDE 10 MG/ML (PF) SYRINGE
PREFILLED_SYRINGE | INTRAVENOUS | Status: AC
  Filled 2017-08-22: qty 10

## 2017-08-22 MED ORDER — POTASSIUM CHLORIDE CRYS ER 20 MEQ PO TBCR: 20.0000 meq | EXTENDED_RELEASE_TABLET | Freq: Every day | ORAL | Status: DC | PRN

## 2017-08-22 MED ORDER — LACTATED RINGERS IV SOLN
INTRAVENOUS | Status: DC | PRN
  Administered 2017-08-22 (×2): via INTRAVENOUS

## 2017-08-22 MED ORDER — CLOPIDOGREL BISULFATE 75 MG PO TABS
75.0000 mg | ORAL_TABLET | Freq: Two times a day (BID) | ORAL | Status: DC
  Filled 2017-08-22: qty 1

## 2017-08-22 MED ORDER — IODIXANOL 320 MG/ML IV SOLN
INTRAVENOUS | Status: DC | PRN
  Administered 2017-08-22: 50 mL via INTRAVENOUS

## 2017-08-22 MED ORDER — SODIUM CHLORIDE 0.9 % IR SOLN
Status: DC | PRN
  Administered 2017-08-22: 1000 mL

## 2017-08-22 MED ORDER — PROPOFOL 10 MG/ML IV BOLUS
INTRAVENOUS | Status: AC
  Filled 2017-08-22: qty 20

## 2017-08-22 MED ORDER — TRAMADOL HCL 50 MG PO TABS: 50.0000 mg | ORAL_TABLET | Freq: Four times a day (QID) | ORAL | Status: DC | PRN

## 2017-08-22 MED ORDER — FENTANYL CITRATE (PF) 100 MCG/2ML IJ SOLN: 25.0000 ug | INTRAMUSCULAR | Status: DC | PRN

## 2017-08-22 MED ORDER — SODIUM CHLORIDE 0.9 % IV SOLN: 500.0000 mL | Freq: Once | INTRAVENOUS | Status: DC | PRN

## 2017-08-22 MED ORDER — CLOPIDOGREL BISULFATE 75 MG PO TABS
ORAL_TABLET | ORAL | Status: AC
  Filled 2017-08-22: qty 1

## 2017-08-22 MED ORDER — HEPARIN (PORCINE) IN NACL 100-0.45 UNIT/ML-% IJ SOLN: 550.0000 [IU]/h | INTRAMUSCULAR | Status: DC

## 2017-08-22 MED ORDER — PHENYLEPHRINE HCL 10 MG/ML IJ SOLN
INTRAMUSCULAR | Status: DC | PRN
  Administered 2017-08-22: 30 ug/min via INTRAVENOUS
  Administered 2017-08-22: 50 ug/min via INTRAVENOUS

## 2017-08-22 MED ORDER — PROPOFOL 10 MG/ML IV BOLUS
INTRAVENOUS | Status: DC | PRN
  Administered 2017-08-22 (×2): 20 mg via INTRAVENOUS
  Administered 2017-08-22: 150 mg via INTRAVENOUS

## 2017-08-22 MED ORDER — CLOPIDOGREL BISULFATE 75 MG PO TABS: 75.0000 mg | ORAL_TABLET | Freq: Once | ORAL | Status: DC

## 2017-08-22 MED ORDER — ASPIRIN EC 325 MG PO TBEC
DELAYED_RELEASE_TABLET | ORAL | Status: AC
  Administered 2017-08-22: 325 mg via ORAL
  Filled 2017-08-22: qty 1

## 2017-08-22 MED ORDER — ASPIRIN 81 MG PO CHEW
81.0000 mg | CHEWABLE_TABLET | Freq: Every day | ORAL | Status: DC
  Administered 2017-08-23 – 2017-08-24 (×2): 81 mg
  Filled 2017-08-22 (×2): qty 1

## 2017-08-22 MED ORDER — SUCCINYLCHOLINE CHLORIDE 200 MG/10ML IV SOSY
PREFILLED_SYRINGE | INTRAVENOUS | Status: AC
  Filled 2017-08-22: qty 10

## 2017-08-22 MED ORDER — CLOPIDOGREL BISULFATE 75 MG PO TABS
75.0000 mg | ORAL_TABLET | Freq: Two times a day (BID) | ORAL | Status: DC
  Administered 2017-08-23 – 2017-08-24 (×3): 75 mg via ORAL
  Filled 2017-08-22 (×2): qty 1

## 2017-08-22 MED ORDER — ALBUTEROL SULFATE (2.5 MG/3ML) 0.083% IN NEBU: 2.5000 mg | INHALATION_SOLUTION | Freq: Four times a day (QID) | RESPIRATORY_TRACT | Status: DC | PRN

## 2017-08-22 MED ORDER — DEXAMETHASONE SODIUM PHOSPHATE 10 MG/ML IJ SOLN
INTRAMUSCULAR | Status: AC
  Filled 2017-08-22: qty 1

## 2017-08-22 MED ORDER — LACTATED RINGERS IV SOLN
INTRAVENOUS | Status: DC | PRN
  Administered 2017-08-22: 09:00:00 via INTRAVENOUS

## 2017-08-22 MED ORDER — ORAL CARE MOUTH RINSE
15.0000 mL | OROMUCOSAL | Status: DC
  Administered 2017-08-22 – 2017-08-23 (×6): 15 mL via OROMUCOSAL

## 2017-08-22 MED ORDER — METOPROLOL TARTRATE 5 MG/5ML IV SOLN: 2.0000 mg | INTRAVENOUS | Status: DC | PRN

## 2017-08-22 MED ORDER — ASPIRIN EC 325 MG PO TBEC
325.0000 mg | DELAYED_RELEASE_TABLET | ORAL | Status: AC
  Administered 2017-08-22: 325 mg via ORAL

## 2017-08-22 MED ORDER — LABETALOL HCL 5 MG/ML IV SOLN: 10.0000 mg | INTRAVENOUS | Status: DC | PRN

## 2017-08-22 MED ORDER — PROPOFOL 1000 MG/100ML IV EMUL
0.0000 ug/kg/min | INTRAVENOUS | Status: DC
  Administered 2017-08-22: 45 ug/kg/min via INTRAVENOUS
  Administered 2017-08-22: 40 ug/kg/min via INTRAVENOUS
  Administered 2017-08-23: 35 ug/kg/min via INTRAVENOUS
  Filled 2017-08-22 (×3): qty 100

## 2017-08-22 MED ORDER — MAGNESIUM SULFATE 2 GM/50ML IV SOLN: 2.0000 g | Freq: Every day | INTRAVENOUS | Status: DC | PRN

## 2017-08-22 MED ORDER — PHENOL 1.4 % MT LIQD: 1.0000 | OROMUCOSAL | Status: DC | PRN

## 2017-08-22 MED ORDER — CLOPIDOGREL BISULFATE 75 MG PO TABS
75.0000 mg | ORAL_TABLET | Freq: Every day | ORAL | Status: DC
  Administered 2017-08-22: 75 mg via ORAL
  Filled 2017-08-22: qty 1

## 2017-08-22 MED ORDER — INSULIN ASPART 100 UNIT/ML ~~LOC~~ SOLN
0.0000 [IU] | SUBCUTANEOUS | Status: DC
  Administered 2017-08-22: 1 [IU] via SUBCUTANEOUS

## 2017-08-22 MED ORDER — PROPOFOL 500 MG/50ML IV EMUL
INTRAVENOUS | Status: DC | PRN
  Administered 2017-08-22: 90 ug/kg/min via INTRAVENOUS

## 2017-08-22 MED ORDER — LIDOCAINE HCL (PF) 1 % IJ SOLN
INTRAMUSCULAR | Status: AC
  Filled 2017-08-22: qty 30

## 2017-08-22 SURGICAL SUPPLY — 34 items
ADH SKN CLS APL DERMABOND .7 (GAUZE/BANDAGES/DRESSINGS) ×2
BLADE SURG 10 STRL SS (BLADE) ×2 IMPLANT
BNDG GAUZE ELAST 4 BULKY (GAUZE/BANDAGES/DRESSINGS) IMPLANT
CANISTER SUCT 3000ML PPV (MISCELLANEOUS) ×3 IMPLANT
CLIP LIGATING EXTRA MED SLVR (CLIP) ×2 IMPLANT
CLIP LIGATING EXTRA SM BLUE (MISCELLANEOUS) ×2 IMPLANT
COVER SURGICAL LIGHT HANDLE (MISCELLANEOUS) ×3 IMPLANT
DERMABOND ADVANCED (GAUZE/BANDAGES/DRESSINGS) ×4
DERMABOND ADVANCED .7 DNX12 (GAUZE/BANDAGES/DRESSINGS) IMPLANT
ELECT REM PT RETURN 9FT ADLT (ELECTROSURGICAL) ×3
ELECTRODE REM PT RTRN 9FT ADLT (ELECTROSURGICAL) ×1 IMPLANT
GAUZE SPONGE 4X4 12PLY STRL (GAUZE/BANDAGES/DRESSINGS) ×3 IMPLANT
GLOVE BIO SURGEON STRL SZ7.5 (GLOVE) ×3 IMPLANT
GLOVE BIOGEL PI IND STRL 6.5 (GLOVE) IMPLANT
GLOVE BIOGEL PI INDICATOR 6.5 (GLOVE) ×2
GLOVE SURG SS PI 6.5 STRL IVOR (GLOVE) ×2 IMPLANT
GOWN STRL REUS W/ TWL LRG LVL3 (GOWN DISPOSABLE) ×3 IMPLANT
GOWN STRL REUS W/TWL LRG LVL3 (GOWN DISPOSABLE) ×9
KIT BASIN OR (CUSTOM PROCEDURE TRAY) ×3 IMPLANT
KIT TURNOVER KIT B (KITS) ×3 IMPLANT
NS IRRIG 1000ML POUR BTL (IV SOLUTION) ×3 IMPLANT
PACK CAROTID (CUSTOM PROCEDURE TRAY) ×2 IMPLANT
PACK GENERAL/GYN (CUSTOM PROCEDURE TRAY) ×1 IMPLANT
PACK UNIVERSAL I (CUSTOM PROCEDURE TRAY) ×1 IMPLANT
PAD ARMBOARD 7.5X6 YLW CONV (MISCELLANEOUS) ×10 IMPLANT
STAPLER VISISTAT 35W (STAPLE) IMPLANT
SUT ETHILON 3 0 PS 1 (SUTURE) IMPLANT
SUT PROLENE 6 0 CC (SUTURE) ×2 IMPLANT
SUT VIC AB 2-0 CTX 36 (SUTURE) IMPLANT
SUT VIC AB 3-0 SH 27 (SUTURE) ×3
SUT VIC AB 3-0 SH 27X BRD (SUTURE) IMPLANT
SUT VICRYL 4-0 PS2 18IN ABS (SUTURE) IMPLANT
TOWEL GREEN STERILE (TOWEL DISPOSABLE) ×3 IMPLANT
WATER STERILE IRR 1000ML POUR (IV SOLUTION) ×3 IMPLANT

## 2017-08-22 SURGICAL SUPPLY — 52 items
ADH SKN CLS APL DERMABOND .7 (GAUZE/BANDAGES/DRESSINGS) ×1
AGENT HMST SPONGE THK3/8 (HEMOSTASIS)
CANISTER SUCT 3000ML PPV (MISCELLANEOUS) ×2 IMPLANT
CANNULA VESSEL 3MM 2 BLNT TIP (CANNULA) ×4 IMPLANT
CATH FOLEY LATEX FREE 14FR (CATHETERS) ×2
CATH FOLEY LF 14FR (CATHETERS) IMPLANT
CATH ROBINSON RED A/P 18FR (CATHETERS) ×2 IMPLANT
CLIP VESOCCLUDE MED 6/CT (CLIP) ×2 IMPLANT
CLIP VESOCCLUDE SM WIDE 6/CT (CLIP) ×2 IMPLANT
COVER PROBE W GEL 5X96 (DRAPES) ×1 IMPLANT
CRADLE DONUT ADULT HEAD (MISCELLANEOUS) ×2 IMPLANT
DECANTER SPIKE VIAL GLASS SM (MISCELLANEOUS) IMPLANT
DERMABOND ADVANCED (GAUZE/BANDAGES/DRESSINGS) ×1
DERMABOND ADVANCED .7 DNX12 (GAUZE/BANDAGES/DRESSINGS) ×1 IMPLANT
DRAIN HEMOVAC 1/8 X 5 (WOUND CARE) IMPLANT
ELECT CAUTERY BLADE 6.4 (BLADE) ×1 IMPLANT
ELECT REM PT RETURN 9FT ADLT (ELECTROSURGICAL) ×2
ELECTRODE REM PT RTRN 9FT ADLT (ELECTROSURGICAL) ×1 IMPLANT
EVACUATOR SILICONE 100CC (DRAIN) IMPLANT
GAUZE SPONGE 4X4 12PLY STRL LF (GAUZE/BANDAGES/DRESSINGS) ×1 IMPLANT
GLOVE BIO SURGEON STRL SZ7.5 (GLOVE) ×2 IMPLANT
GLOVE BIOGEL PI IND STRL 7.5 (GLOVE) IMPLANT
GLOVE BIOGEL PI INDICATOR 7.5 (GLOVE) ×1
GOWN STRL REUS W/ TWL LRG LVL3 (GOWN DISPOSABLE) ×3 IMPLANT
GOWN STRL REUS W/TWL LRG LVL3 (GOWN DISPOSABLE) ×6
HEMOSTAT SPONGE AVITENE ULTRA (HEMOSTASIS) IMPLANT
KIT BASIN OR (CUSTOM PROCEDURE TRAY) ×2 IMPLANT
KIT SHUNT ARGYLE CAROTID ART 6 (VASCULAR PRODUCTS) IMPLANT
KIT TURNOVER KIT B (KITS) ×2 IMPLANT
NDL HYPO 25GX1X1/2 BEV (NEEDLE) IMPLANT
NDL PERC 18GX7CM (NEEDLE) IMPLANT
NEEDLE HYPO 25GX1X1/2 BEV (NEEDLE) IMPLANT
NEEDLE PERC 18GX7CM (NEEDLE) ×2 IMPLANT
NS IRRIG 1000ML POUR BTL (IV SOLUTION) ×4 IMPLANT
PACK CAROTID (CUSTOM PROCEDURE TRAY) ×2 IMPLANT
PAD ARMBOARD 7.5X6 YLW CONV (MISCELLANEOUS) ×4 IMPLANT
SHEATH PINNACLE 6F 10CM (SHEATH) ×1 IMPLANT
SHUNT CAROTID BYPASS 10 (VASCULAR PRODUCTS) IMPLANT
SHUNT CAROTID BYPASS 12FRX15.5 (VASCULAR PRODUCTS) IMPLANT
STRIP CLOSURE SKIN 1/2X4 (GAUZE/BANDAGES/DRESSINGS) ×1 IMPLANT
SUT ETHILON 3 0 PS 1 (SUTURE) ×1 IMPLANT
SUT PROLENE 6 0 CC (SUTURE) ×2 IMPLANT
SUT SILK 3 0 TIES 17X18 (SUTURE)
SUT SILK 3-0 18XBRD TIE BLK (SUTURE) IMPLANT
SUT VIC AB 3-0 SH 27 (SUTURE) ×2
SUT VIC AB 3-0 SH 27X BRD (SUTURE) ×1 IMPLANT
SUT VICRYL 4-0 PS2 18IN ABS (SUTURE) ×2 IMPLANT
SYR CONTROL 10ML LL (SYRINGE) IMPLANT
TAPE CLOTH SURG 4X10 WHT LF (GAUZE/BANDAGES/DRESSINGS) ×1 IMPLANT
TOWEL GREEN STERILE (TOWEL DISPOSABLE) ×2 IMPLANT
WATER STERILE IRR 1000ML POUR (IV SOLUTION) ×2 IMPLANT
WIRE BENTSON .035X145CM (WIRE) ×1 IMPLANT

## 2017-08-22 NOTE — Progress Notes (Signed)
Patient ID: Jeanne Lawson, female   DOB: 13-Jan-1946, 72 y.o.   MRN: [id number] INR. 17 Y WM admitted for re- treatment of RT ICA  Intracranial aneurysm via direct carotid puncture.  Patient denies recent new neuro symptoms.Mild Lt UE weakness from a previous stroke remains stable. The patient denies any recent chest pains or SOB or PND. Does have intermittent wheezing for which she takes an inhaler though has not taken one today.  Procedure ,reasons risks alternatives have been discussed with the patient and family. Risks of thromboembolic stroke,ICH peri procedural or delayed  ICH ,vent dependency,vascular injury  death have been been reviewed.(Patient and family aware of these from previous similar procedures.). Post treatment care also explained in detail. Patient to be left intubated post procedure for airway protection whilst on IV heparin. Patient and family expressed understanding and agreed to proceed with the treatment plan. S.Dantae Meunier MD.

## 2017-08-22 NOTE — Consult Note (Signed)
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  Patient was left intubated postop for airway protection given direct carotid access and need for postop IV heparin.  PCCM consulted for overnight vent management.  Of note she had similar procedure in 2016 with plans for overnight vent support but did not wean immediately, required 3-4 days vent.   PAST MEDICAL HISTORY :  She  has a past medical history of Cancer ([place]), COPD (chronic obstructive pulmonary disease) ([place]), Coronary artery disease (1996), Edema of foot, Hypertension, PAC (premature atrial contraction), Pancreatitis, PONV (postoperative nausea and vomiting), Saccular aneurysm, Shortness of breath dyspnea, Stroke ([place]) (04/2014), TIA (transient ischemic attack), Tobacco abuse, and Ventricular hypertrophy (04/2009).  PAST SURGICAL HISTORY: She  has a past surgical history that includes Vesicovaginal fistula closure w/ TAH (25 yrs ago); Neck surgery (50 yrs ago); US ECHOCARDIOGRAPHY (04-26-2009); Cardiovascular stress test (12-02-2001); Cardiac catheterization (1996); Abdominal hysterectomy; laparoscopy; Radiology with anesthesia (N/A, 05/25/2014); Appendectomy; Radiology with anesthesia (N/A, 08/12/2014); Endarterectomy (Right, 08/12/2014); Endarterectomy (Right, 08/12/2014); Endarterectomy (Left, 11/18/2014); Endarterectomy (Left, 11/18/2014); Radiology with anesthesia (N/A, 03/15/2015); Cholecystectomy (N/A, 03/09/2016); IR Radiologist Eval &  Mgmt (06/26/2016); and IR Radiologist Eval & Mgmt (05/08/2017).  Allergies  Allergen Reactions  . Dilaudid [Hydromorphone] Swelling    SWELLING REACTION UNSPECIFIED   . Latex Swelling    gloves used at dental office caused lips to swell. Used different ones no problem. Never had any problem at hospital or anywhere else.  . Lidocaine Other (See Comments)    "makes my heart race"  . Sulfa Drugs Cross Reactors Other (See Comments)    UNSPECIFIED REACTION OF CHILDHOOD  . Codeine Nausea And Vomiting    No current facility-administered medications on file prior to encounter.    Current Outpatient Medications on File Prior to Encounter  Medication Sig  . albuterol (PROVENTIL HFA;VENTOLIN HFA) 108 (90 BASE) MCG/ACT inhaler Inhale 1-2 puffs into the lungs every 6 (six) hours as needed for wheezing or shortness of breath.  Marland Kitchen aspirin EC 81 MG tablet Take 81 mg by mouth daily with lunch.   . clopidogrel (PLAVIX) 75 MG tablet Take 75 mg by mouth every morning.   Marland Kitchen lisinopril-hydrochlorothiazide (PRINZIDE,ZESTORETIC) 20-12.5 MG tablet Take 1 tablet by mouth daily. (Patient taking differently: Take 1 tablet by mouth daily with lunch. )  . TOPROL XL 50 MG 24 hr tablet Take 1/2 tablet by mouth twice daily.  Take with or immediately following a meal. (Patient taking differently: Take 25 mg by mouth 2 (two) times daily. Take 1/2 tablet by mouth twice daily.  Take with or immediately following a meal.)    FAMILY HISTORY:  Her family history includes Aneurysm in her sister; Diabetes in her father; Emphysema in her father; Heart disease in her mother; Hypertension in her daughter and mother; Stroke in her mother; Thyroid disease in her daughter.  SOCIAL HISTORY: She  reports that she quit smoking about 3 years ago. Her smoking use included cigarettes. She has a 30.00 pack-year smoking history. She has  never used smokeless tobacco. She reports that she does not drink alcohol or use drugs.  REVIEW OF SYSTEMS:    Unable, pt sedated post op. As per HPI obtained from records and staff.   SUBJECTIVE:    VITAL SIGNS: BP (!) 123/59 (BP Location: Left Leg)   Pulse 61   Temp 98.7 F (37.1 C) (Oral)   Resp 16   Ht 5\' 1"  (1.549 m)   Wt 76.7 kg (169 lb)   SpO2 100%   BMI 31.93 kg/m   HEMODYNAMICS:    VENTILATOR SETTINGS: Vent Mode: PRVC FiO2 (%):  [40 %-100 %] 40 % Set Rate:  [16 bmp] 16 bmp Vt Set:  [460 mL] 460 mL PEEP:  [5 cmH20] 5 cmH20 Plateau Pressure:  [18 cmH20] 18 cmH20  INTAKE / OUTPUT: No intake/output data recorded.  PHYSICAL EXAMINATION: General:  Chronically ill appearing female, NAD on vent  Neuro:  Sedated post op, on propofol, RASS -2  HEENT:  Mm moist, ETT, R neck incision c/d  Cardiovascular:  s1s2 rrr Lungs:  Res[s even non labored on vent, diminished  Abdomen:  Round, soft Musculoskeletal:  Warm and dry, chronic 1-2+ BLE edema   LABS:  BMET No results for input(s): NA, K, CL, CO2, BUN, CREATININE, GLUCOSE in the last 168 hours.  Electrolytes No results for input(s): CALCIUM, MG, PHOS in the last 168 hours.  CBC No results for input(s): WBC, HGB, HCT, PLT in the last 168 hours.  Coag's No results for input(s): APTT, INR in the last 168 hours.  Sepsis Markers No results for input(s): LATICACIDVEN, PROCALCITON, O2SATVEN in the last 168 hours.  ABG No results for input(s): PHART, PCO2ART, PO2ART in the last 168 hours.  Liver Enzymes No results for input(s): AST, ALT, ALKPHOS, BILITOT, ALBUMIN in the last 168 hours.  Cardiac Enzymes No results for input(s): TROPONINI, PROBNP in the last 168 hours.  Glucose No results for input(s): GLUCAP in the last 168 hours.  Imaging No results found.   STUDIES:    CULTURES:   ANTIBIOTICS:   SIGNIFICANT EVENTS: 7/31>> R ICA aneurysm embolization (deveshwar) with direct carotid access/puncture ([facility])   LINES/TUBES: ETT 7/31>>>  DISCUSSION: 72yo female with known R ICA aneurysm admitted for neuro  IR repair (with direct operative carotid access per [facility]) to remain intubated overnight for airway protection r/t need for IV heparin post direct carotid puncture.   ASSESSMENT / PLAN:   Intubation/ need for airway protection  Hx COPD  P:   Vent support - 8cc/kg  F/u CXR  F/u ABG SBT in am  Monitor for neck/airway swelling  BDs   Hx HTN  Hx CAD  P:  Hold home lisinopril, toprol for now    R ICA aneurysm - s/p neuro IR repair with direct carotid access by VVS  P:  F/u CBC  Heparin gtt per pharmacy  plavix per Neuro IR  Monitor closely for bleeding  Neuro checks    Sedation needs on vent  P:   RASS goal: -1 Daily WUA   Labs, CXR ordered and pending   FAMILY  - Updates:  No family at bedside 7/31.  D/w Dr. [name].   - Inter-disciplinary family meet or Palliative Care meeting due by:  Day 7     Nickolas Madrid, NP 08/22/2017  3:14 PM Pager: [phone] or [phone]

## 2017-08-22 NOTE — Progress Notes (Signed)
Saw patient in neuro ICU following procedure. Patient underwent an image-guided cerebral angiogram with embolization of her right ICA PCOM aneurysm this AM with Dr. [name].  Patient laying in bed intubated and sedated. Speech and comprehension not assessed. PERRL bilaterally. EOMs intact bilaterally without nystagmus or subjective diplopia. Visual fields not assessed. No facial asymmetry. Unable to protrude tongue. Motor power symmetric proportional to effort. Pronator drift not assessed. Fine motor and coordination not assessed. Gait not assessed. Romberg not assessed. Heel to toe not assessed. Distal pulses palpable by Doppler bilaterally.  Plan to stay in neuro ICU for overnight observation. Appreciate and agree with CCM management. IR to follow.  Bea Graff Louk, PA-C 08/22/2017, 3:21 PM

## 2017-08-22 NOTE — OR Nursing (Signed)
Patient was transported with one blue vessel loop ends  Clamped together.  Leeann Must, RN BSN

## 2017-08-22 NOTE — Op Note (Signed)
    OPERATIVE REPORT  DATE OF SURGERY: 08/22/2017  PATIENT: Jeanne Lawson, 72 y.o. female MRN: [id number]  DOB: [birth year]  PRE-OPERATIVE DIAGNOSIS: Open exposure of right common carotid artery for intracranial intervention  POST-OPERATIVE DIAGNOSIS:  Same  PROCEDURE: Removal of right common carotid artery 6 French sheath and neck closure  SURGEON:  Curt Jews, M.D.  PHYSICIAN ASSISTANT: Matt Eveland, PA-C  ANESTHESIA: General  EBL: per anesthesia record  Total I/O In: 1500 [I.V.:1500] Out: 1020 [Urine:910; Blood:110]  BLOOD ADMINISTERED: none  DRAINS: none  SPECIMEN: none  COUNTS CORRECT:  YES  PATIENT DISPOSITION:  PACU - hemodynamically stable  PROCEDURE DETAILS: Patient was transferred from interventional radiology to the operating room.  The right neck was reprepped and draped.  The 6 French sheath was in place in the common carotid artery.  A pursestring had been placed earlier at the time of the exposure.  The 6 French sheath was removed and the pursestring suture was closed giving excellent hemostasis.  The wound was irrigated with saline.  Hemostasis was obtained with electrocautery.  The wounds were closed with 3-0 Vicryl in the platysma and a running fashion.  The skin was closed with 4-0 subcuticular Vicryl suture.  Sterile dressing was applied and the patient was transferred to the recovery room intubated   Jeanne Lawson, M.D., [facility] 08/22/2017 1:58 PM

## 2017-08-22 NOTE — Progress Notes (Signed)
ANTICOAGULATION CONSULT NOTE - Initial Consult  Pharmacy Consult:  Heparin Indication: Post Interventional Neuroradiology Procedure  Allergies  Allergen Reactions  . Dilaudid [Hydromorphone] Swelling    SWELLING REACTION UNSPECIFIED   . Latex Swelling    gloves used at dental office caused lips to swell. Used different ones no problem. Never had any problem at hospital or anywhere else.  . Lidocaine Other (See Comments)    "makes my heart race"  . Sulfa Drugs Cross Reactors Other (See Comments)    UNSPECIFIED REACTION OF CHILDHOOD  . Codeine Nausea And Vomiting    Patient Measurements: Height: 5\' 1"  (154.9 cm) Weight: 169 lb (76.7 kg) IBW/kg (Calculated) : 47.8 Heparin Dosing Weight: 65 kg  Vital Signs: Temp: 98.7 F (37.1 C) (07/31 0550) Temp Source: Oral (07/31 0550) BP: 123/59 (07/31 1445) Pulse Rate: 61 (07/31 1445)  Labs: No results for input(s): HGB, HCT, PLT, APTT, LABPROT, INR, HEPARINUNFRC, HEPRLOWMOCWT, CREATININE, CKTOTAL, CKMB, TROPONINI in the last 72 hours.  Estimated Creatinine Clearance: 58.7 mL/min (by C-G formula based on SCr of 0.8 mg/dL).   Medical History: Past Medical History:  Diagnosis Date  . Cancer (HCC)    right leg skin   . COPD (chronic obstructive pulmonary disease) ([place])   . Coronary artery disease 1996   Known with prior mild lesion of LAD demonstrated by Cardiac Catheterization in 1996  . Edema of foot    She has a history of chronic edema of the left dated back to age 19 when she suufered severe frostbite playing  on the snow as a child  . Hypertension   . PAC (premature atrial contraction)   . Pancreatitis    x2  . PONV (postoperative nausea and vomiting)    problems waking up last time 4/16 only time  . 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  Marland Kitchen Shortness of breath dyspnea    since stroke 2 months ago -  . Stroke [facility]) 04/2014   She had had a  previous thrombotic stroke  involving the right corona radiata in October 2010; left arm and leg weakness  . TIA (transient ischemic attack)    She was hospitalized 04-23-09 through 04-27-09 for involving right side of the body  . Tobacco abuse    Ongoing   . Ventricular hypertrophy 04/2009   LVH with diastolic dysfunction by echo. Has normal EF.      Assessment: 72 YOF s/p carotid artery aneurysm embolization to start IV heparin.  Platelet count is low but no overt bleeding reported.   Goal of Therapy:  Heparin level 0.1-0.25 units/ml Monitor platelets by anticoagulation protocol: Yes    Plan:  Change heparin gtt to 550 units/hr Check 8 hr heparin level Stop heparin tomorrow at 0800 per protocol   Nakisha Chai D. Mina Marble, PharmD, BCPS, [place] 08/22/2017, 2:51 PM

## 2017-08-22 NOTE — Anesthesia Postprocedure Evaluation (Signed)
Anesthesia Post Note  Patient: Jeanne Lawson  Procedure(s) Performed: EXPOSURE CAROTID ARTERY FOR DEVESHWAR SURGERY (Right Neck) ANGIOGRAM CAROTID CERVICAL RIGHT (Right Neck) EMBOLIZATION (N/A )     Patient location during evaluation: SICU Anesthesia Type: General Level of consciousness: sedated Pain management: pain level controlled Vital Signs Assessment: post-procedure vital signs reviewed and stable Respiratory status: patient remains intubated per anesthesia plan Cardiovascular status: stable Postop Assessment: no apparent nausea or vomiting Anesthetic complications: no    Last Vitals:  Vitals:   08/22/17 1445 08/22/17 1545  BP: (!) 123/59 (!) 100/57  Pulse: 61 (!) 55  Resp: 16 16  Temp:    SpO2: 100% 100%    Last Pain:  Vitals:   08/22/17 0601  TempSrc:   PainSc: 0-No pain                 Catalina Gravel

## 2017-08-22 NOTE — Interval H&P Note (Signed)
History and Physical Interval Note:  08/22/2017 7:38 AM  Jeanne Lawson  has presented today for surgery, with the diagnosis of brain aneurysm  The various methods of treatment have been discussed with the patient and family. After consideration of risks, benefits and other options for treatment, the patient has consented to  Procedure(s): EXPOSURE CAROTID ARTERY FOR [place] (Right) as a surgical intervention .  The patient's history has been reviewed, patient examined, no change in status, stable for surgery.  I have reviewed the patient's chart and labs.  Questions were answered to the patient's satisfaction.     Ruta Hinds

## 2017-08-22 NOTE — H&P (Signed)
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 female   Hx hypertension, COPD, TIA (04/2009), stroke 10/2008, hypertension, CAD, PAC, and COPD.  Known to NIR, followed by Dr. [name]: 08/12/2014: right ICA PCOM aneurysm embolization 11/08/2014: left ACA aneurysm embolization  Pt is scheduled today for exposure - direct access to Right carotid artery to be performed in OR with Dr Ruta Hinds Upon completion of this procedure -- Dr [name] is scheduled to perform R internal carotid posterior communicating artery aneurysm embolization  CTA 04/16/17:  IMPRESSION: 1. 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 Note that the right posterior communicating artery remains patent and may arise from the aneurysm neck (series 8, image 100). Right ICA covered stent remains patent. 2. Stable and satisfactory post treatment appearance of the thrombosed Left ICA/A1 aneurysm. Left ICA covered stent remains patent. 3. Mild irregularity has developed in the left MCA branches since 2017 and might reflect increased atherosclerosis. No associated stenosis. 4. Otherwise stable and negative anterior and posterior circulation. 5. Minor atherosclerosis in the neck with no stenosis. 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 6. Stable CT appearance of the brain since 2017.   Past Medical History:  Diagnosis Date  . Cancer (HCC)    right leg skin   . COPD (chronic obstructive pulmonary disease) ([place])   . Coronary artery disease 1996   Known with prior mild lesion of LAD demonstrated by Cardiac  Catheterization in 1996  . Edema of foot    She has a history of chronic edema of the left dated back to age 72 when she suufered severe frostbite playing  on the snow as a child  . Hypertension   . PAC (premature atrial contraction)   . Pancreatitis    x2  . PONV (postoperative nausea and vomiting)    problems waking up last time 4/16 only time  . 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  Marland Kitchen Shortness of breath dyspnea    since stroke 2 months ago -  . 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 TIA (transient ischemic attack)    She was hospitalized 04-23-09 through 04-27-09 for involving right side of the body  . Tobacco abuse    Ongoing   . Ventricular hypertrophy 04/2009   LVH with diastolic dysfunction by echo. Has normal EF.    Past Surgical History:  Procedure Laterality Date  . ABDOMINAL HYSTERECTOMY    . APPENDECTOMY    . CARDIAC CATHETERIZATION  1996   Mild CAD with vasospasm  . CARDIOVASCULAR STRESS TEST  12-02-2001   EF 70%  . CHOLECYSTECTOMY N/A 03/09/2016   Procedure: LAPAROSCOPIC CHOLECYSTECTOMY WITH INTRAOPERATIVE CHOLANGIOGRAM;  Surgeon: Georganna Skeans, MD;  Location: [place];  Service: General;  Laterality: N/A;  . ENDARTERECTOMY Right 08/12/2014   Procedure: RIGHT  COMMON CAROTID ARTERY EXPOSURE FOR INTERVENTIONAL RADIOLOGY PROCEDURE BY DR.DEVASHWAR,Insertion 6 FR sheath;  Surgeon: Elam Dutch,  MD;  Location: [place];  Service: Vascular;  Laterality: Right;  . ENDARTERECTOMY Right 08/12/2014   Procedure:  CAROTID  EXPOSURE CLOSURE RIGHT NECK, REPAIR RIGHT COMMON CAROTID ARTERY;  Surgeon: Elam Dutch, MD;  Location: [place];  Service: Vascular;  Laterality: Right;  . ENDARTERECTOMY Left 11/18/2014   Procedure: CAROTID EXPOSURE;  Surgeon: Elam Dutch, MD;  Location: [place];  Service:  Vascular;  Laterality: Left;  . ENDARTERECTOMY Left 11/18/2014   Procedure: CLOSURE CAROTID;  Surgeon: Elam Dutch, MD;  Location: [place];  Service: Vascular;  Laterality: Left;  . IR RADIOLOGIST EVAL & MGMT  06/26/2016  . IR RADIOLOGIST EVAL & MGMT  05/08/2017  . LAPAROSCOPY    . NECK SURGERY  50 yrs ago   Left side tumor  . RADIOLOGY WITH ANESTHESIA N/A 05/25/2014   Procedure: RADIOLOGY WITH ANESTHESIA;  Surgeon: [name], MD;  Location: [place];  Service: Radiology;  Laterality: N/A;  . RADIOLOGY WITH ANESTHESIA N/A 08/12/2014   Procedure: RADIOLOGY WITH ANESTHESIA;  Surgeon: [name], MD;  Location: [place];  Service: Radiology;  Laterality: N/A;  . RADIOLOGY WITH ANESTHESIA N/A 03/15/2015   Procedure: MRI OF BRAIN WITHOUT CONTRAST;  Surgeon: Medication Radiologist, MD;  Location: [place];  Service: Radiology;  Laterality: N/A;  DR. TAT/MRI  . US ECHOCARDIOGRAPHY  04-26-2009   EF 65-70%  . VESICOVAGINAL FISTULA CLOSURE W/ TAH  25 yrs ago    Allergies: Dilaudid [hydromorphone]; Latex; Lidocaine; Sulfa drugs cross reactors; and Codeine  Medications: Prior to Admission medications   Medication Sig Start Date End Date Taking? Authorizing Provider  albuterol (PROVENTIL HFA;VENTOLIN HFA) 108 (90 BASE) MCG/ACT inhaler Inhale 1-2 puffs into the lungs every 6 (six) hours as needed for wheezing or shortness of breath.   Yes [provider]  aspirin EC 81 MG tablet Take 81 mg by mouth daily with lunch.    Yes [provider]  ciprofloxacin (CIPRO) 500 MG tablet Take 1 tablet (500 mg total) by mouth 2 (two) times daily. 08/14/17  Yes Fields, Jessy Oto, MD  clopidogrel (PLAVIX) 75 MG tablet Take 75 mg by mouth every morning.  07/31/17  Yes [provider]  lisinopril-hydrochlorothiazide (PRINZIDE,ZESTORETIC) 20-12.5 MG tablet Take 1 tablet by mouth daily. Patient taking differently: Take 1 tablet by mouth daily with lunch.  02/26/17  Yes Dorothy Spark, MD    TOPROL XL 50 MG 24 hr tablet Take 1/2 tablet by mouth twice daily.  Take with or immediately following a meal. Patient taking differently: Take 25 mg by mouth 2 (two) times daily. Take 1/2 tablet by mouth twice daily.  Take with or immediately following a meal. 04/11/17  Yes Dorothy Spark, MD     Family History  Problem Relation Age of Onset  . Emphysema Father   . Diabetes Father   . Aneurysm Sister   . Stroke Mother   . Hypertension Mother   . Heart disease Mother   . Hypertension Daughter   . Thyroid disease Daughter     Social History   Socioeconomic History  . Marital status: Widowed    Spouse name: Not on file  . Number of children: Not on file  . Years of education: Not on file  . Highest education level: Not on file  Occupational History  . Not on file  Social Needs  . Financial resource strain: Not on file  . Food insecurity:    Worry: Not on file  Inability: Not on file  . Transportation needs:    Medical: Not on file    Non-medical: Not on file  Tobacco Use  . Smoking status: Former Smoker    Packs/day: 1.00    Years: 30.00    Pack years: 30.00    Types: Cigarettes    Last attempt to quit: 04/26/2014    Years since quitting: 3.3  . Smokeless tobacco: Never Used  Substance and Sexual Activity  . Alcohol use: No  . Drug use: No  . Sexual activity: Not on file  Lifestyle  . Physical activity:    Days per week: Not on file    Minutes per session: Not on file  . Stress: Not on file  Relationships  . Social connections:    Talks on phone: Not on file    Gets together: Not on file    Attends religious service: Not on file    Active member of club or organization: Not on file    Attends meetings of clubs or organizations: Not on file    Relationship status: Not on file  Other Topics Concern  . Not on file  Social History Narrative  . Not on file    Review of Systems: A 12 point ROS discussed and pertinent positives are indicated in the HPI  above.  All other systems are negative.  Review of Systems  Constitutional: Negative for activity change, appetite change, fatigue and fever.  HENT: Negative for trouble swallowing.   Eyes: Negative for visual disturbance.  Respiratory: Negative for cough and shortness of breath.   Cardiovascular: Negative for chest pain.  Gastrointestinal: Negative for abdominal pain, nausea and vomiting.  Musculoskeletal: Negative for back pain and gait problem.  Neurological: Positive for headaches. Negative for dizziness, tremors, seizures, syncope, facial asymmetry, speech difficulty, weakness, light-headedness and numbness.  Psychiatric/Behavioral: Negative for behavioral problems and confusion.    Vital Signs: BP 136/67   Pulse 62   Temp 98.7 F (37.1 C) (Oral)   Resp 20   Ht 5\' 1"  (1.549 m)   Wt 169 lb (76.7 kg)   SpO2 99%   BMI 31.93 kg/m   Physical Exam  Constitutional: She is oriented to person, place, and time. She appears well-nourished.  HENT:  Head: Atraumatic.  Eyes: EOM are normal.  Neck: Neck supple.  Cardiovascular: Normal rate, regular rhythm and normal heart sounds.  No murmur heard. Pulmonary/Chest: Effort normal and breath sounds normal. She has no wheezes.  Abdominal: Soft. Bowel sounds are normal. There is no tenderness.  Musculoskeletal: Normal range of motion.  Neurological: She is alert and oriented to person, place, and time.  Skin: Skin is warm and dry.  Psychiatric: She has a normal mood and affect. Her behavior is normal. Judgment and thought content normal.  Nursing note and vitals reviewed.   Imaging: No results found.  Labs:  CBC: Recent Labs    06/13/17 0936 08/14/17 0916  WBC 3.6* 3.6*  HGB 11.4* 10.5*  HCT 36.0 33.5*  PLT 125* 105*    COAGS: Recent Labs    06/13/17 0936 08/14/17 0916  INR 0.95 0.98  APTT 21* 22*    BMP: Recent Labs    04/16/17 1536 06/13/17 0936 08/14/17 0916  NA  --  138 138  K  --  3.8 4.2  CL  --   100* 104  CO2  --  28 24  GLUCOSE  --  102* 93  BUN  --  9 7*  CALCIUM  --  9.3 8.7*  CREATININE 0.90 0.78 0.80  GFRNONAA  --  >60 >60  GFRAA  --  >60 >60    LIVER FUNCTION TESTS: Recent Labs    06/13/17 0936 08/14/17 0916  BILITOT 1.1 0.9  AST 23 25  ALT 16 17  ALKPHOS 45 39  PROT 7.3 6.5  ALBUMIN 3.7 3.4*    TUMOR MARKERS: No results for input(s): AFPTM, CEA, CA199, CHROMGRNA in the last 8760 hours.  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 Note that the right posterior communicating artery remains patent and may arise from the aneurysm neck Now scheduled for R ICA exposure for direct access in OR with Dr Oneida Alar Then Posterior communicating artery aneurysm embolization in IR with Dr [name] Risks and benefits of cerebral angiogram with intervention were discussed with the patient including, but not limited to bleeding, infection, vascular injury, contrast induced renal failure, stroke or even death.  This interventional procedure involves the use of X-rays and because of the nature of the planned procedure, it is possible that we will have prolonged use of X-ray fluoroscopy.  Potential radiation risks to you include (but are not limited to) the following: - A slightly elevated risk for cancer  several years later in life. This risk is typically less than 0.5% percent. This risk is low in comparison to the normal incidence of human cancer, which is 33% for women and 50% for men according to the [place]. - Radiation induced injury can include skin redness, resembling a rash, tissue breakdown / ulcers and hair loss (which can be temporary or permanent).   The likelihood of either of these occurring depends on the difficulty of the procedure and whether you are sensitive to radiation due to previous procedures,  disease, or genetic conditions.   IF your procedure requires a prolonged use of radiation, you will be notified and given written instructions for further action.  It is your responsibility to monitor the irradiated area for the 2 weeks following the procedure and to notify your physician if you are concerned that you have suffered a radiation induced injury.    All of the patient's questions were answered, patient is agreeable to proceed.  Consent signed and in chart.  Pt and family is aware if intervention is performed she will be admitted to Neuro ICU overnight and plan for discharge in am Agreeable to plan  Thank you for this interesting consult.  I greatly enjoyed meeting Jeanne Lawson and look forward to participating in their care.  A copy of this report was sent to the requesting provider on this date.  Electronically Signed: Lavonia Drafts, PA-C 08/22/2017, 6:57 AM   I spent a total of    25 Minutes in face to face in clinical consultation, greater than 50% of which was counseling/coordinating care for P COM aneurysm embolization

## 2017-08-22 NOTE — Transfer of Care (Signed)
Immediate Anesthesia Transfer of Care Note  Patient: Jeanne Lawson  Procedure(s) Performed: EXPOSURE CAROTID ARTERY FOR [place] (Right Neck) ANGIOGRAM CAROTID CERVICAL RIGHT (Right Neck) EMBOLIZATION (N/A )  Patient Location: ICU  Anesthesia Type:General  Level of Consciousness: Patient remains intubated per anesthesia plan  Airway & Oxygen Therapy: Patient remains intubated per anesthesia plan and Patient placed on Ventilator (see vital sign flow sheet for setting)  Post-op Assessment: Report given to RN and Post -op Vital signs reviewed and stable  Post vital signs: Reviewed and stable  Last Vitals:  Vitals Value Taken Time  BP    Temp    Pulse    Resp    SpO2      Last Pain:  Vitals:   08/22/17 0601  TempSrc:   PainSc: 0-No pain      Patients Stated Pain Goal: 4 (51/88/41 6606)  Complications: No apparent anesthesia complications

## 2017-08-22 NOTE — Procedures (Signed)
S/ P RT internal carotid arteriogram. RT carotid direct puncture per vascular surgery Placement of a pipeline flex flow diverter device for a RTb ICA post communicating aneurys.Marland Kitchen

## 2017-08-22 NOTE — Anesthesia Procedure Notes (Signed)
Arterial Line Insertion Start/End7/31/2019 7:45 AM Performed by: Wilburn Cornelia, CRNA, CRNA  Patient location: Pre-op. Preanesthetic checklist: patient identified, IV checked, site marked, risks and benefits discussed, surgical consent, monitors and equipment checked, pre-op evaluation and anesthesia consent Right, radial was placed Catheter size: 20 G Hand hygiene performed , maximum sterile barriers used  and Seldinger technique used Allen's test indicative of satisfactory collateral circulation Attempts: 2 (1st attempt by SRNA. unable to advance catheter. ) Procedure performed without using ultrasound guided technique. Following insertion, dressing applied and Biopatch. Post procedure assessment: normal  Patient tolerated the procedure well with no immediate complications.

## 2017-08-22 NOTE — Anesthesia Procedure Notes (Addendum)
Procedure Name: Intubation Date/Time: 08/22/2017 8:55 AM Performed by: Cleda Daub, CRNA Pre-anesthesia Checklist: Patient identified, Emergency Drugs available, Suction available and Patient being monitored Patient Re-evaluated:Patient Re-evaluated prior to induction Oxygen Delivery Method: Circle system utilized Preoxygenation: Pre-oxygenation with 100% oxygen Induction Type: IV induction and Cricoid Pressure applied Ventilation: Mask ventilation without difficulty and Mask ventilation throughout procedure Laryngoscope Size: Glidescope and 3 (intubated by RaFuria, SRNA under supervision of Dr. Linna Caprice. ) Grade View: Grade I Tube size: 7.0 mm Number of attempts: 1 Airway Equipment and Method: Stylet and Video-laryngoscopy Placement Confirmation: ETT inserted through vocal cords under direct vision,  positive ETCO2 and breath sounds checked- equal and bilateral Secured at: 21 cm Tube secured with: Tape Dental Injury: Teeth and Oropharynx as per pre-operative assessment

## 2017-08-22 NOTE — Op Note (Signed)
Procedure: Exposure of right common carotid artery placement of 6 French sheath, right carotid Angio  Preoperative diagnosis: Intracerebral aneurysm  Postoperative diagnosis: Same  Anesthesia: General  Assistant: Arlee Muslim, PA-C  Operative details: After obtaining informed consent, the patient was taken the operating.  The patient was placed in supine position operating table.  After induction of general anesthesia and tracheal intubation, the patient's entire right neck and chest were prepped and draped in usual sterile fashion.  Next a longitudinal incision was made at the base of the right neck through a pre-existing scar.  Incision was carried through the subtendinous tissues and platysma.  There was fairly dense scar tissue from previous exposure of the common carotid artery which made anatomic planes difficult to define.  I dissected out what initially I thought was the right common carotid artery but this actually turned out to be the right subclavian artery.  A more medial and superior location was then dissected free and the common carotid artery was exposed circumferentially over about a 3 cm segment.  The vagus nerve was not clearly identified but dissection was carried out right on the surface of the artery to try to avoid any nerve injury.  A 6-0 Prolene suture was used to create a pursestring in the subadventitial portion of the common carotid artery.  An introducer needle was then placed through the center of this pursestring sticks and a Bentson wire advanced into the common carotid artery and a 6 French short sheath placed over this.  Contrast angiogram was performed to confirm and within the common carotid.  The sheath had only minimal purchase in the artery so again using roadmapping techniques I advanced the sheath and dilator over the wire about another 2 cm so it was distal tip in the bulb of the carotid.  There was good flow distally.  The internal carotid artery was patent  without significant stenosis.  The external carotid artery was small to giving off a normal branch pattern.  At this point the sheath was sutured into place with nylon sutures.  A blue vessel loop was placed around the common carotid artery just below the sheath in the event that vascular control was needed.  These were then taped to the skin with Steri-Strips.  A moist lap was placed over the incision and a towel placed over this and the patient transported to interventional radiology for the remainder of her procedure.  This is dictated as a separate operative note by Dr. [name].  Ruta Hinds, MD [facility] Office: [phone] Pager: [phone]

## 2017-08-22 NOTE — Anesthesia Preprocedure Evaluation (Addendum)
Anesthesia Evaluation  Patient identified by MRN, date of birth, ID band Patient awake    Reviewed: Allergy & Precautions, NPO status , Patient's Chart, lab work & pertinent test results  Airway Mallampati: II  TM Distance: >3 FB Neck ROM: Full    Dental  (+) Partial Lower, Partial Upper,    Pulmonary former smoker,    breath sounds clear to auscultation       Cardiovascular hypertension,  Rhythm:Regular Rate:Normal     Neuro/Psych    GI/Hepatic   Endo/Other    Renal/GU      Musculoskeletal   Abdominal   Peds  Hematology   Anesthesia Other Findings   Reproductive/Obstetrics                            Anesthesia Physical Anesthesia Plan  ASA: III  Anesthesia Plan: General   Post-op Pain Management:    Induction: Intravenous  PONV Risk Score and Plan: Ondansetron and Dexamethasone  Airway Management Planned: Oral ETT  Additional Equipment: Arterial line  Intra-op Plan:   Post-operative Plan: Extubation in OR  Informed Consent: I have reviewed the patients History and Physical, chart, labs and discussed the procedure including the risks, benefits and alternatives for the proposed anesthesia with the patient or authorized representative who has indicated his/her understanding and acceptance.   Dental advisory given  Plan Discussed with: CRNA and Anesthesiologist  Anesthesia Plan Comments:         Anesthesia Quick Evaluation

## 2017-08-22 NOTE — Transfer of Care (Deleted)
Immediate Anesthesia Transfer of Care Note  Patient: Jeanne Lawson  Procedure(s) Performed: EXPOSURE CAROTID ARTERY FOR [place] (Right Neck) ANGIOGRAM CAROTID CERVICAL RIGHT (Right Neck) EMBOLIZATION (N/A )  Patient Location: ICU  Anesthesia Type:General  Level of Consciousness: Patient remains intubated per anesthesia plan  Airway & Oxygen Therapy: Patient remains intubated per anesthesia plan and Patient placed on Ventilator (see vital sign flow sheet for setting)  Post-op Assessment: Report given to RN and Post -op Vital signs reviewed and stable  Post vital signs: Reviewed and stable  Last Vitals:  Vitals Value Taken Time  BP    Temp    Pulse    Resp    SpO2      Last Pain:  Vitals:   08/22/17 0601  TempSrc:   PainSc: 0-No pain      Patients Stated Pain Goal: 4 (49/44/96 7591)  Complications: No apparent anesthesia complications

## 2017-08-23 ENCOUNTER — Inpatient Hospital Stay (HOSPITAL_COMMUNITY): Payer: Medicare Other

## 2017-08-23 ENCOUNTER — Encounter (HOSPITAL_COMMUNITY): Payer: Self-pay | Admitting: Vascular Surgery

## 2017-08-23 DIAGNOSIS — Z419 Encounter for procedure for purposes other than remedying health state, unspecified: Secondary | ICD-10-CM

## 2017-08-23 DIAGNOSIS — I671 Cerebral aneurysm, nonruptured: Principal | ICD-10-CM

## 2017-08-23 DIAGNOSIS — R0602 Shortness of breath: Secondary | ICD-10-CM

## 2017-08-23 DIAGNOSIS — Z9889 Other specified postprocedural states: Secondary | ICD-10-CM

## 2017-08-23 LAB — CBC
HCT: 26.9 % — ABNORMAL LOW (ref 36.0–46.0)
Hemoglobin: 8.6 g/dL — ABNORMAL LOW (ref 12.0–15.0)
MCH: 27.8 pg (ref 26.0–34.0)
MCHC: 32 g/dL (ref 30.0–36.0)
MCV: 87.1 fL (ref 78.0–100.0)
PLATELETS: 107 10*3/uL — AB (ref 150–400)
RBC: 3.09 MIL/uL — ABNORMAL LOW (ref 3.87–5.11)
RDW: 15.4 % (ref 11.5–15.5)
WBC: 5.5 10*3/uL (ref 4.0–10.5)

## 2017-08-23 LAB — APTT: APTT: 42 s — AB (ref 24–36)

## 2017-08-23 LAB — PROTIME-INR
INR: 1.13
Prothrombin Time: 14.4 seconds (ref 11.4–15.2)

## 2017-08-23 LAB — COMPREHENSIVE METABOLIC PANEL
ALT: 12 U/L (ref 0–44)
AST: 21 U/L (ref 15–41)
Albumin: 2.7 g/dL — ABNORMAL LOW (ref 3.5–5.0)
Alkaline Phosphatase: 27 U/L — ABNORMAL LOW (ref 38–126)
Anion gap: 11 (ref 5–15)
BUN: 9 mg/dL (ref 8–23)
CHLORIDE: 106 mmol/L (ref 98–111)
CO2: 23 mmol/L (ref 22–32)
CREATININE: 0.83 mg/dL (ref 0.44–1.00)
Calcium: 7.8 mg/dL — ABNORMAL LOW (ref 8.9–10.3)
GFR calc non Af Amer: >60 mL/min (ref >60)
Glucose, Bld: 149 mg/dL — ABNORMAL HIGH (ref 70–99)
POTASSIUM: 3.4 mmol/L — AB (ref 3.5–5.1)
SODIUM: 140 mmol/L (ref 135–145)
Total Bilirubin: 0.6 mg/dL (ref 0.3–1.2)
Total Protein: 5.2 g/dL — ABNORMAL LOW (ref 6.5–8.1)

## 2017-08-23 LAB — POCT I-STAT 3, ART BLOOD GAS (G3+)
Acid-base deficit: 2 mmol/L (ref 0.0–2.0)
Bicarbonate: 22.7 mmol/L (ref 20.0–28.0)
O2 Saturation: 99 %
PCO2 ART: 35.4 mmHg (ref 32.0–48.0)
PO2 ART: 114 mmHg — AB (ref 83.0–108.0)
Patient temperature: 98.2
TCO2: 24 mmol/L (ref 22–32)
pH, Arterial: 7.414 (ref 7.350–7.450)

## 2017-08-23 LAB — BLOOD GAS, ARTERIAL
ACID-BASE DEFICIT: 2.1 mmol/L — AB (ref 0.0–2.0)
BICARBONATE: 21.5 mmol/L (ref 20.0–28.0)
Drawn by: 331761
FIO2: 40
O2 SAT: 98.6 %
PATIENT TEMPERATURE: 98.5
PEEP/CPAP: 5 cmH2O
PH ART: 7.436 (ref 7.350–7.450)
PO2 ART: 117 mmHg — AB (ref 83.0–108.0)
Pressure support: 7 cmH2O
pCO2 arterial: 32.5 mmHg (ref 32.0–48.0)

## 2017-08-23 LAB — HEPARIN LEVEL (UNFRACTIONATED): HEPARIN UNFRACTIONATED: 0.38 [IU]/mL (ref 0.30–0.70)

## 2017-08-23 LAB — GLUCOSE, CAPILLARY
GLUCOSE-CAPILLARY: 103 mg/dL — AB (ref 70–99)
Glucose-Capillary: 110 mg/dL — ABNORMAL HIGH (ref 70–99)
Glucose-Capillary: 89 mg/dL (ref 70–99)

## 2017-08-23 LAB — MAGNESIUM: Magnesium: 1.8 mg/dL (ref 1.7–2.4)

## 2017-08-23 MED ORDER — POTASSIUM CHLORIDE 10 MEQ/100ML IV SOLN
10.0000 meq | INTRAVENOUS | Status: AC
  Administered 2017-08-23 (×3): 10 meq via INTRAVENOUS
  Filled 2017-08-23 (×3): qty 100

## 2017-08-23 MED ORDER — FUROSEMIDE 10 MG/ML IJ SOLN
20.0000 mg | Freq: Once | INTRAMUSCULAR | Status: AC
  Administered 2017-08-23: 20 mg via INTRAVENOUS
  Filled 2017-08-23: qty 2

## 2017-08-23 MED ORDER — MAGNESIUM SULFATE IN D5W 1-5 GM/100ML-% IV SOLN
1.0000 g | Freq: Once | INTRAVENOUS | Status: AC
  Administered 2017-08-23: 1 g via INTRAVENOUS
  Filled 2017-08-23: qty 100

## 2017-08-23 MED ORDER — HEPARIN (PORCINE) IN NACL 100-0.45 UNIT/ML-% IJ SOLN
450.0000 [IU]/h | INTRAMUSCULAR | Status: DC
  Filled 2017-08-23: qty 250

## 2017-08-23 MED ORDER — LACTATED RINGERS IV BOLUS
500.0000 mL | Freq: Once | INTRAVENOUS | Status: AC
  Administered 2017-08-23: 500 mL via INTRAVENOUS

## 2017-08-23 MED ORDER — KETOROLAC TROMETHAMINE 30 MG/ML IJ SOLN
INTRAMUSCULAR | Status: AC
  Filled 2017-08-23: qty 1

## 2017-08-23 MED ORDER — KETOROLAC TROMETHAMINE 30 MG/ML IJ SOLN
30.0000 mg | Freq: Four times a day (QID) | INTRAMUSCULAR | Status: DC | PRN
  Administered 2017-08-23: 30 mg via INTRAVENOUS

## 2017-08-23 MED ORDER — DEXMEDETOMIDINE HCL IN NACL 400 MCG/100ML IV SOLN: 0.4000 ug/kg/h | INTRAVENOUS | Status: DC

## 2017-08-23 MED ORDER — POTASSIUM CHLORIDE 10 MEQ/50ML IV SOLN: 10.0000 meq | INTRAVENOUS | Status: DC

## 2017-08-23 MED ORDER — IPRATROPIUM-ALBUTEROL 0.5-2.5 (3) MG/3ML IN SOLN: 3.0000 mL | Freq: Four times a day (QID) | RESPIRATORY_TRACT | Status: DC | PRN

## 2017-08-23 NOTE — Progress Notes (Signed)
eLink Physician-Brief Progress Note Patient Name: Jeanne Lawson DOB: [birth year] MRN: [id number]   Date of Service  08/23/2017  HPI/Events of Note  Low urine output despite 500 ml LR fluid bolus x1  eICU Interventions  LR 500 ml iv bolus repeated x 1        [name] 08/23/2017, 4:58 AM

## 2017-08-23 NOTE — Progress Notes (Signed)
Patient seen in afternoon rounds with Dr. [name].  She does have some facial flushing which is new.  Question whether related to toradol this AM vs. Inhalers.  Subjectively feels her throat is more swollen, however PE unchanged from this AM.  Tolerating ice chips and sips of water. Able to communicate.  Neuro exam unchanged.  She does have evidence of increased swelling in her hands.  Her urine output has been low.  Foley remains in place. Discontinue toradol.  Continue IVF for now. May have tylenol PRN as needed.   Will continue to follow.   Brynda Greathouse, MS RD PA-C

## 2017-08-23 NOTE — Progress Notes (Signed)
[place] for IV Heparin  Indication: Post Neuro IR  Allergies  Allergen Reactions  . Dilaudid [Hydromorphone] Swelling    SWELLING REACTION UNSPECIFIED   . Latex Swelling    gloves used at dental office caused lips to swell. Used different ones no problem. Never had any problem at hospital or anywhere else.  . Lidocaine Other (See Comments)    "makes my heart race"  . Sulfa Drugs Cross Reactors Other (See Comments)    UNSPECIFIED REACTION OF CHILDHOOD  . Codeine Nausea And Vomiting    Patient Measurements: Height: 5\' 1"  (154.9 cm) Weight: 168 lb 14 oz (76.6 kg) IBW/kg (Calculated) : 47.8 Heparin Dosing Weight: 65 kg  Vital Signs: Temp: 98.3 F (36.8 C) (08/01 0000) Temp Source: Oral (08/01 0000) BP: 140/84 (07/31 2300) Pulse Rate: 69 (07/31 2300)  Labs: Recent Labs    08/22/17 1502 08/22/17 2335  HGB 8.6*  --   HCT 26.3*  --   PLT 95*  --   HEPARINUNFRC  --  0.38  CREATININE 0.60  --     Estimated Creatinine Clearance: 58.6 mL/min (by C-G formula based on SCr of 0.6 mg/dL).   Medical History: Past Medical History:  Diagnosis Date  . Cancer (HCC)    right leg skin   . COPD (chronic obstructive pulmonary disease) ([place])   . Coronary artery disease 1996   Known with prior mild lesion of LAD demonstrated by Cardiac Catheterization in 1996  . Edema of foot    She has a history of chronic edema of the left dated back to age 72 when she suufered severe frostbite playing  on the snow as a child  . Hypertension   . PAC (premature atrial contraction)   . Pancreatitis    x2  . PONV (postoperative nausea and vomiting)    problems waking up last time 4/16 only time  . 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  Marland Kitchen Shortness of breath dyspnea    since stroke 2 months ago -  . 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 TIA (transient ischemic attack)    She was hospitalized 04-23-09 through 04-27-09 for involving right side of the body  . Tobacco abuse    Ongoing   . Ventricular hypertrophy 04/2009   LVH with diastolic dysfunction by echo. Has normal EF.    Assessment: 72 YOF s/p carotid artery aneurysm embolization to start IV heparin.  Platelet count is low but no overt bleeding reported.  8/1 AM update: heparin level above goal  Goal of Therapy:  Heparin level 0.1-0.25 units/ml Monitor platelets by anticoagulation protocol: Yes   Plan:  Dec heparin to 450 units/hr Heparin off at [place], PharmD, [place] Pharmacist Phone: [phone]

## 2017-08-23 NOTE — Progress Notes (Signed)
[facility] [place]  Subjective  - awake and follows commands, sedated on vent   Objective 115/63 (!) 57 97.6 F (36.4 C) (Axillary) 16 100%  Intake/Output Summary (Last 24 hours) at 08/23/2017 0713 Last data filed at 08/23/2017 0600 Gross per 24 hour  Intake 3555.7 ml  Output 1570 ml  Net 1985.7 ml   Right neck no hematoma Neuro follows commands 5/5 motor UE LE  Chest xray no hemidiaphragm elevation   Assessment/Planning: S/p right carotid exposure for access for neuro intervention Stable from my standpoint.  Hopefully extubate today if ok with primary service and CCM  Ruta Hinds 08/23/2017 7:13 AM --  Laboratory Lab Results: Recent Labs    08/22/17 1502 08/23/17 0519  WBC 3.5* 5.5  HGB 8.6* 8.6*  HCT 26.3* 26.9*  PLT 95* 107*   BMET Recent Labs    08/22/17 1502 08/23/17 0519  NA 138 140  K 3.0* 3.4*  CL 107 106  CO2 22 23  GLUCOSE 124* 149*  BUN 6* 9  CREATININE 0.60 0.83  CALCIUM 7.0* 7.8*    COAG Lab Results  Component Value Date   INR 1.13 08/23/2017   INR 0.98 08/14/2017   INR 0.95 06/13/2017   No results found for: PTT

## 2017-08-23 NOTE — Progress Notes (Signed)
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  Communicating.  No deficits noted.   Allergies: Dilaudid [hydromorphone]; Latex; Lidocaine; Sulfa drugs cross reactors; and Codeine  Medications: Prior to Admission medications   Medication Sig Start Date End Date Taking? Authorizing Provider  albuterol (PROVENTIL HFA;VENTOLIN HFA) 108 (90 BASE) MCG/ACT inhaler Inhale 1-2 puffs into the lungs every 6 (six) hours as needed for wheezing or shortness of breath.   Yes [provider]  aspirin EC 81 MG tablet Take 81 mg by mouth daily with lunch.    Yes [provider]  ciprofloxacin (CIPRO) 500 MG tablet Take 1 tablet (500 mg total) by mouth 2 (two) times daily. 08/14/17  Yes Fields, Jessy Oto, MD  clopidogrel (PLAVIX) 75 MG tablet Take 75 mg by mouth every morning.  07/31/17  Yes [provider]  lisinopril-hydrochlorothiazide (PRINZIDE,ZESTORETIC) 20-12.5 MG tablet Take 1 tablet by mouth daily. Patient taking differently: Take 1 tablet by mouth daily with lunch.  02/26/17  Yes Dorothy Spark, MD  TOPROL XL 50 MG 24 hr tablet Take 1/2 tablet by mouth twice daily.  Take with or immediately following a meal. Patient taking differently: Take 25 mg by mouth 2 (two) times daily. Take 1/2 tablet by mouth twice daily.  Take with or immediately following a meal. 04/11/17  Yes Dorothy Spark, MD     Vital Signs: BP 118/61   Pulse 77   Temp (!) 97.5 F (36.4 C) (Axillary)   Resp (!) 21   Ht 5\' 1"  (1.549 m)   Wt 168 lb 14 oz (76.6 kg)   SpO2 100%   BMI 31.91 kg/m   Physical Exam  Constitutional: She is oriented to person, place, and time. She appears well-developed. No distress.  Neck: Normal range of motion. No tracheal deviation present.  Wound to right neck.  Swelling noted at incision, however not impeding  ability to communicate or swallow.   Cardiovascular: Normal rate, regular rhythm and normal heart sounds.  No murmur heard. Pulmonary/Chest: Effort normal and breath sounds normal. No respiratory distress.  Extubated, on room air  Neurological: She is alert and oriented to person, place, and time.  EOMs intact.  Communicating without difficutly. No word-finding or speech difficulty.  Facial movements intact.  No upper or lower extremity weakness bilaterally.   Skin: She is not diaphoretic.  Nursing note and vitals reviewed.   Imaging: Dg Chest Port 1 View  Result Date: 08/23/2017 CLINICAL DATA:  Status post left ACA aneurysm repair EXAM: PORTABLE CHEST 1 VIEW COMPARISON:  08/22/2017 FINDINGS: Cardiac shadow is enlarged but stable. Endotracheal tube and nasogastric catheter are again seen. Mild basilar atelectatic changes are noted new from the prior study. Some fullness of the pulmonary artery is noted centrally stable from the previous exam. No other focal abnormality is noted. IMPRESSION: New bibasilar atelectatic changes. Electronically Signed   By: Inez Catalina M.D.   On: 08/23/2017 09:19   Dg Chest Port 1 View  Result Date: 08/22/2017 CLINICAL DATA:  Endotracheal tube placement EXAM: PORTABLE CHEST 1 VIEW COMPARISON:  03/12/2015 FINDINGS: Endotracheal tube in good position.  NG tube in the stomach. Bibasilar atelectasis.  Negative for edema or effusion IMPRESSION: Endotracheal tube in good position.  Bibasilar atelectasis. Electronically Signed   By: Franchot Gallo M.D.   On: 08/22/2017 15:31    Labs:  CBC:  Recent Labs    06/13/17 0936 08/14/17 0916 08/22/17 1502 08/23/17 0519  WBC 3.6* 3.6* 3.5* 5.5  HGB 11.4* 10.5* 8.6* 8.6*  HCT 36.0 33.5* 26.3* 26.9*  PLT 125* 105* 95* 107*    COAGS: Recent Labs    06/13/17 0936 08/14/17 0916 08/23/17 0519  INR 0.95 0.98 1.13  APTT 21* 22* 42*    BMP: Recent Labs    06/13/17 0936 08/14/17 0916 08/22/17 1502 08/23/17 0519    NA 138 138 138 140  K 3.8 4.2 3.0* 3.4*  CL 100* 104 107 106  CO2 28 24 22 23   GLUCOSE 102* 93 124* 149*  BUN 9 7* 6* 9  CALCIUM 9.3 8.7* 7.0* 7.8*  CREATININE 0.78 0.80 0.60 0.83  GFRNONAA >60 >60 >60 >60  GFRAA >60 >60 >60 >60    LIVER FUNCTION TESTS: Recent Labs    06/13/17 0936 08/14/17 0916 08/22/17 1502 08/23/17 0519  BILITOT 1.1 0.9 1.2 0.6  AST 23 25 22 21   ALT 16 17 13 12   ALKPHOS 45 39 27* 27*  PROT 7.3 6.5 4.5* 5.2*  ALBUMIN 3.7 3.4* 2.5* 2.7*    Assessment and Plan: RTb ICA post communicating aneurysm Procedure technically successful with assistance from vascular surgery for direct access to carotid.  Patient remained intubated overnight. She has been extubated this AM.  Communicating well.  Swelling noted at incision site which is expected post-op but no hematoma, difficulty speaking, or swallowing.  She has been assessed by Vascular this AM. No neuro deficits noted.  Plan for PT/OT evaluation today.  To get ASA and Plavix this AM.  Disposition to be determined after evaluation complete. Appreciate assistance from vascular service as well as CCM.  Electronically Signed: Docia Barrier, PA 08/23/2017, 10:38 AM   I spent a total of 15 Minutes at the the patient's bedside AND on the patient's hospital floor or unit, greater than 50% of which was counseling/coordinating care for ICA post communicating aneurysm.

## 2017-08-23 NOTE — Procedures (Signed)
Extubation Procedure Note  Patient Details:   Name: Jeanne Lawson DOB: 1945-07-31 MRN: [id number]   Airway Documentation:    Vent end date: 08/23/17 Vent end time: 0923   Evaluation  O2 sats: stable throughout Complications: No apparent complications Patient did tolerate procedure well. Bilateral Breath Sounds: Clear   Yes   Patient was extubated to a 3L [place] without any complications, dyspnea or stridor noted. Patient was instructed on IS x 5, highest goal achieved was 725mL.  Tarrance Januszewski, Eddie North 08/23/2017, 9:23 AM

## 2017-08-23 NOTE — Progress Notes (Signed)
eLink Physician-Brief Progress Note Patient Name: Jeanne Lawson DOB: 01-07-1946 MRN: [id number]   Date of Service  08/23/2017  HPI/Events of Note  Diminished urine output post-op  eICU Interventions  Lactated Ringers 500 ml iv fluid bolus x 1        [name] 08/23/2017, 2:22 AM

## 2017-08-23 NOTE — Progress Notes (Signed)
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  Patient was left intubated postop for airway protection given direct carotid access and need for postop IV heparin.  PCCM consulted for overnight vent management.  Of note she had similar procedure in 2016 with plans for overnight vent support but did not wean immediately, required 3-4 days vent.   STUDIES: No new studies, chest x-ray was essentially okay  CULTURES: None  ANTIBIOTICS: Rocephin given postprocedure as a prophylaxis   SIGNIFICANT EVENTS: 7/31>> R ICA aneurysm embolization (deveshwar) with direct carotid access/puncture (VVS)   LINES/TUBES: ETT 7/31>>> A-line 08/22/2017  SUBJECTIVE: No significant overnight event, seen by vascular and interventional radiology felt that there is no evidence of any bleeding - Patient is awake off sedation able to follow commands and writing on the board - Currently on pressure support of 7 with PEEP of 5 -Patient is a very anxious lady -Overnight decrease in urine output noted required some IV fluids   VITAL SIGNS: BP 118/65   Pulse 66   Temp (!) 97.5 F (36.4 C) (Axillary)   Resp (!) 23   Ht 5\' 1"  (1.549 m)   Wt 168 lb 14 oz (76.6 kg)   SpO2 100%   BMI 31.91 kg/m   HEMODYNAMICS:    VENTILATOR SETTINGS: Vent Mode: PSV;CPAP FiO2 (%):  [40 %-100 %] 40 % Set Rate:  [16 bmp] 16 bmp Vt Set:  [460 mL] 460 mL PEEP:  [5 cmH20] 5 cmH20 Pressure Support:  [12 cmH20] 12  cmH20 Plateau Pressure:  [16 cmH20-18 cmH20] 17 cmH20  INTAKE / OUTPUT: I/O last 3 completed shifts: In: 4312.6 [I.V.:3112.8; IV Piggyback:1199.8] Out: 7169 [Urine:1460; Blood:110]  PHYSICAL EXAMINATION: General:  Chronically ill appearing female, NAD on vent  Neuro: Awake and following commands HEENT:  Mm moist, ETT, R neck incision c/d  Cardiovascular:  s1s2 rrr Lungs:  Res[s even non labored on vent, diminished  Abdomen:  Round, soft Musculoskeletal:  Warm and dry, chronic 1-2+ BLE edema   LABS:  BMET Recent Labs  Lab 08/22/17 1502 08/23/17 0519  NA 138 140  K 3.0* 3.4*  CL 107 106  CO2 22 23  BUN 6* 9  CREATININE 0.60 0.83  GLUCOSE 124* 149*    Electrolytes Recent Labs  Lab 08/22/17 1502 08/23/17 0519  CALCIUM 7.0* 7.8*  MG 1.7 1.8    CBC Recent Labs  Lab 08/22/17 1502 08/23/17 0519  WBC 3.5* 5.5  HGB 8.6* 8.6*  HCT 26.3* 26.9*  PLT 95* 107*    Coag's Recent Labs  Lab 08/23/17 0519  APTT 42*  INR 1.13    Sepsis Markers No results for input(s): LATICACIDVEN, PROCALCITON, O2SATVEN in the last 168 hours.  ABG Recent Labs  Lab 08/22/17 1501 08/23/17 0404  PHART 7.405 7.414  PCO2ART 42.8 35.4  PO2ART 309.0* 114.0*    Liver Enzymes Recent Labs  Lab 08/22/17 1502 08/23/17 0519  AST 22 21  ALT 13 12  ALKPHOS 27* 27*  BILITOT 1.2 0.6  ALBUMIN 2.5* 2.7*    Cardiac Enzymes No results for input(s): TROPONINI, PROBNP in the last 168 hours.  Glucose Recent Labs  Lab 08/22/17 1625 08/22/17 1939 08/22/17 2325  GLUCAP 138* 118* 132*    Imaging Dg Chest Port 1 View  Result Date: 08/22/2017 CLINICAL DATA:  Endotracheal tube placement EXAM: PORTABLE CHEST 1 VIEW COMPARISON:  03/12/2015 FINDINGS: Endotracheal tube in good position.  NG tube in the stomach. Bibasilar atelectasis.  Negative for edema or effusion IMPRESSION: Endotracheal tube in good position.  Bibasilar atelectasis. Electronically Signed   By: Franchot Gallo M.D.   On:  08/22/2017 15:31       DISCUSSION: 72yo female with known R ICA aneurysm admitted for neuro IR repair (with direct operative carotid access per VVS) to remain intubated overnight for airway protection r/t need for IV heparin post direct carotid puncture.   ASSESSMENT / PLAN:   Intubation/ need for airway protection  Hx COPD  P:   Doing well with SBT- ABG in 30 minutes if doing well consider extubation after leak test Aspiration precaution No severe neck swelling noted Monitor for neck/airway swelling  BDs   Hx HTN  Hx CAD  P:  Blood pressure improved - Monitor blood pressure if blood pressure allows resume home medication like lisinopril and metoprolol after bedside swallow - Continue Plavix as outlined by interventional radiology   R ICA aneurysm - s/p neuro IR repair with direct carotid access by VVS  P:  H&H is stable Heparin gtt per pharmacy  plavix per Neuro IR  Monitor closely for bleeding  Neuro checks    Sedation needs on vent  P:   Currently sedation vacation -Precedex as ordered as patient is very anxious however seems like patient is doing well without any sedation at this point -If patient does well will consider extubation and resumption of home medication  Renal: -Decrease in urine output noted overnight -Currently on LR at 75 -Patient might have ATN as patient was hypotensive during the surgery which is hopefully to recover -Continue Foley monitoring for now -K and mag replaced   Skin/Wound: chronic changes   Electrolytes: Replace electrolytes per ICU electrolyte replacement protocol.   IVF: none  Nutrition: NPO for now  Prophylaxis: DVT Prophylaxis with Heparin,. GI Prophylaxis.   Restraints: Soft limb  PT/OT eval and treat. OOB when appropriate.   Lines/Tubes:  No foley  No  central line. A line   ADVANCE DIRECTIVE:Full code  FAMILY DISCUSSION:spoke with family IR and Nursing  Quality Care: PPI, DVT prophylaxis, HOB elevated,  Infection control all reviewed and addressed.  Events and notes from last 24 hours reviewed. Care plan discussed on multidisciplinary rounds  CC TIME:32 min    Old records reviewed discussed results and management plan with patient  Images personally reviewed and results and labs reviewed and discussed with patient.  All medication reviewed and adjusted  Further management depending on test results and work up as outlined above.    Intisar Claudio Manuella Ghazi, M.

## 2017-08-24 ENCOUNTER — Telehealth: Payer: Self-pay | Admitting: Vascular Surgery

## 2017-08-24 DIAGNOSIS — J9601 Acute respiratory failure with hypoxia: Secondary | ICD-10-CM

## 2017-08-24 LAB — CBC WITH DIFFERENTIAL/PLATELET
ABS IMMATURE GRANULOCYTES: 0 10*3/uL (ref 0.0–0.1)
BASOS PCT: 0 %
Basophils Absolute: 0 10*3/uL (ref 0.0–0.1)
Eosinophils Absolute: 0 10*3/uL (ref 0.0–0.7)
Eosinophils Relative: 0 %
HCT: 25.9 % — ABNORMAL LOW (ref 36.0–46.0)
HEMOGLOBIN: 7.9 g/dL — AB (ref 12.0–15.0)
Immature Granulocytes: 1 %
Lymphocytes Relative: 22 %
Lymphs Abs: 1.1 10*3/uL (ref 0.7–4.0)
MCH: 27.2 pg (ref 26.0–34.0)
MCHC: 30.5 g/dL (ref 30.0–36.0)
MCV: 89.3 fL (ref 78.0–100.0)
MONO ABS: 0.5 10*3/uL (ref 0.1–1.0)
MONOS PCT: 9 %
NEUTROS ABS: 3.3 10*3/uL (ref 1.7–7.7)
Neutrophils Relative %: 68 %
PLATELETS: 116 10*3/uL — AB (ref 150–400)
RBC: 2.9 MIL/uL — ABNORMAL LOW (ref 3.87–5.11)
RDW: 16 % — ABNORMAL HIGH (ref 11.5–15.5)
WBC: 4.9 10*3/uL (ref 4.0–10.5)

## 2017-08-24 LAB — BASIC METABOLIC PANEL
ANION GAP: 8 (ref 5–15)
BUN: 14 mg/dL (ref 8–23)
CALCIUM: 7.8 mg/dL — AB (ref 8.9–10.3)
CO2: 25 mmol/L (ref 22–32)
Chloride: 107 mmol/L (ref 98–111)
Creatinine, Ser: 0.85 mg/dL (ref 0.44–1.00)
GFR calc Af Amer: >60 mL/min (ref >60)
GFR calc non Af Amer: >60 mL/min (ref >60)
GLUCOSE: 92 mg/dL (ref 70–99)
Potassium: 3.5 mmol/L (ref 3.5–5.1)
Sodium: 140 mmol/L (ref 135–145)

## 2017-08-24 LAB — PHOSPHORUS: Phosphorus: 2.6 mg/dL (ref 2.5–4.6)

## 2017-08-24 LAB — GLUCOSE, CAPILLARY
GLUCOSE-CAPILLARY: 111 mg/dL — AB (ref 70–99)
GLUCOSE-CAPILLARY: 87 mg/dL (ref 70–99)
Glucose-Capillary: 75 mg/dL (ref 70–99)

## 2017-08-24 LAB — MAGNESIUM: Magnesium: 2.2 mg/dL (ref 1.7–2.4)

## 2017-08-24 NOTE — Progress Notes (Signed)
[facility]  Subjective  - feels ok   Objective (!) 149/68 64 97.7 F (36.5 C) (Oral) (!) 23 93%  Intake/Output Summary (Last 24 hours) at 08/24/2017 1032 Last data filed at 08/24/2017 0800 Gross per 24 hour  Intake 1032.82 ml  Output 2125 ml  Net -1092.18 ml   No problems with neck incision neuro intact  Assessment/Planning: Plan is for d/c home today I will arrange follow up in 2-3 weeks  Ruta Hinds 08/24/2017 10:32 AM --  Laboratory Lab Results: Recent Labs    08/23/17 0519 08/24/17 0604  WBC 5.5 4.9  HGB 8.6* 7.9*  HCT 26.9* 25.9*  PLT 107* 116*   BMET Recent Labs    08/23/17 0519 08/24/17 0604  NA 140 140  K 3.4* 3.5  CL 106 107  CO2 23 25  GLUCOSE 149* 92  BUN 9 14  CREATININE 0.83 0.85  CALCIUM 7.8* 7.8*    COAG Lab Results  Component Value Date   INR 1.13 08/23/2017   INR 0.98 08/14/2017   INR 0.95 06/13/2017   No results found for: PTT

## 2017-08-24 NOTE — Discharge Instructions (Signed)
Endovascular Therapy for Cerebral Aneurysm, Care After °This sheet gives you information about how to care for yourself after your procedure. Your health care provider may also give you more specific instructions. If you have problems or questions, contact your health care provider. °What can I expect after the procedure? °After the procedure, it is common to have: °· Pain, tenderness, and swelling around your incision. °· Headaches. ° °Follow these instructions at home: °Medicines °· Take over-the-counter and prescription medicines only as told by your health care provider. °· If you were prescribed medicines to prevent blood clots (antiplatelet medicines), talk with your health care provider about the risks. These medicines can increase bleeding, so you may need to avoid certain activities. °Incision care °· Follow instructions from your health care provider about how to take care of your incision. Make sure you: °? Wash your hands with soap and water before you change your bandage (dressing). If soap and water are not available, use hand sanitizer. °? Change your dressing as told by your health care provider. °? Leave stitches (sutures), skin glue, or adhesive strips in place. These skin closures may need to stay in place for 2 weeks or longer. If adhesive strip edges start to loosen and curl up, you may trim the loose edges. Do not remove adhesive strips completely unless your health care provider tells you to do that. °· Check your incision area every day for signs of infection. Check for: °? Redness, swelling, or pain. °? Fluid or blood. °? Warmth. °? Pus or a bad smell. °Activity °· Ask your health care provider what activities are safe for you during recovery. Most people can return to normal activities 2-6 weeks after the procedure. °· Do not drive until your health care provider approves. °· Do not drive or use heavy machinery while taking prescription pain medicine. °· Do not lift anything that is heavier  than 10 lb (4.5 kg), or the limit that you are told, until your health care provider says that it is safe. °· Exercise regularly, as directed by your health care provider. °· Attend rehabilitation therapy as told by your health care provider. This may include: °? Physical and occupational therapy. °? Speech-language therapy. °? Brain exercises. °? Balance exercises. °? Individual or group therapy. °? Education about your condition and treatment. °Eating and drinking °· Drink enough fluid to keep your urine pale yellow. °· Eat a healthy diet. This includes plenty of fruits and vegetables, whole grains, low-fat dairy products, and lean protein. °General instructions °· Do not use any products that contain nicotine or tobacco, such as cigarettes and e-cigarettes. If you need help quitting, ask your health care provider. °· Do not take baths, swim, or use a hot tub until your health care provider approves. Ask your health care provider if you may take showers. You may only be allowed to take sponge baths for bathing. °· Manage your stress. If you need help with this, talk with your health care provider. °· Wear compression stockings as told by your health care provider. These stockings help to prevent blood clots and reduce swelling in your legs. °· Keep all follow-up visits as told by your health care provider. This is important. °Contact a health care provider if: °· You have redness, swelling, or pain around your incision. °· You have fluid or blood coming from your incision. °· Your incision feels warm to the touch. °· You have pus or a bad smell coming from your incision. °· You have   a fever. °Get help right away if: °· You have: °? Stiffness in your neck. °? Pain, numbness, weakness, or swelling in your legs. °? Severe chest pain. °? Difficulty breathing. °? Confusion. °· You have any symptoms of stroke. "BE FAST" is an easy way to remember the main warning signs of stroke: °? B - Balance. Signs are dizziness,  sudden trouble walking, or loss of balance. °? E - Eyes. Signs are trouble seeing or a sudden change in vision. °? F - Face. Signs are sudden weakness or numbness of the face, or the face or eyelid drooping on one side. °? A - Arms. Signs are weakness or numbness in an arm. This happens suddenly and usually on one side of the body. °? S - Speech. Signs are sudden trouble speaking, slurred speech, or trouble understanding what people say. °? T - Time. Time to call emergency services. Write down what time symptoms started. °· You have other signs of stroke, such as: °? A sudden, severe headache that does not get better with medicine. °? Sudden nausea or vomiting. °? A seizure. °These symptoms may represent a serious problem that is an emergency. Do not wait to see if the symptoms will go away. Get medical help right away. Call your local emergency services (911 in the [place] Do not drive yourself to the hospital. °Summary °· After this procedure, it is common to have some pain and swelling around your incision. °· Follow instructions from your health care provider about how to take care of your incision. Check for signs of infection every day. °· Most people can return to normal activities 2-6 weeks after the procedure. Ask your health care provider what activities are safe for you during recovery. °· Do not drive until your health care provider approves. Do not drive while taking prescription pain medicine. °· Do not use any products that contain nicotine or tobacco, such as cigarettes and e-cigarettes. If you need help quitting, ask your health care provider. °This information is not intended to replace advice given to you by your health care provider. Make sure you discuss any questions you have with your health care provider. °Document Released: 04/17/2016 Document Revised: 04/17/2016 Document Reviewed: 04/17/2016 °Elsevier Interactive Patient Education © 2018 [employer] ° °

## 2017-08-24 NOTE — Plan of Care (Signed)
Patient is on clear liquid diet, maintain BP, pulse and oxygen sats without intervention.

## 2017-08-24 NOTE — Plan of Care (Signed)
Have removed foley and paitnet has voided.  Patient has ambulated in room and hallway x2 and is ready for discharge. Plans have been made for patient to stay with daughter for a week until ready to return to home.

## 2017-08-24 NOTE — Discharge Summary (Signed)
Patient ID: Jeanne Lawson MRN: [id number] DOB/AGE: 04-26-1945 72 y.o.  Admit date: 08/22/2017 Discharge date: 08/24/2017  Supervising Physician: [name]  Patient Status: [facility]  Admission Diagnoses: Brain aneurysm, Cerebral aneurysm  Discharge Diagnoses:  Active Problems:   Brain aneurysm   Cerebral aneurysm   Discharged Condition: stable  Hospital Course:   Patient presented to [facility] 08/22/2017 for an image-guided cerebral angiogram with Dr. [name]. Before procedure occurred, patient was taken to OR with Dr. Oneida Alar for exposure of right common carotid artery placement of 6 French sheath for vascular access for Dr. Arlean Hopping procedure. Following Dr. Arlean Hopping procedure, patient was taken back to OR for closure with Dr. Oneida Alar. Both procedures occurred without major complications and patient was transferred to [facility] ICU for observation, VSS.  No major events occurred overnight. Patient remained intubated until 08/23/2017. Post-extubation, she had no focal neurological symptoms. She spent another night in neuro ICU for observation.  No major events occurred overnight. Patient awake and alert sitting in chair with no complaints at this time. Accompanied by daughter at bedside. Plan to discharge home today and follow-up in clinic with Dr. [name] in 2 weeks.   Significant Diagnostic Studies: Dg Chest Port 1 View  Result Date: 08/23/2017 CLINICAL DATA:  Status post left ACA aneurysm repair EXAM: PORTABLE CHEST 1 VIEW COMPARISON:  08/22/2017 FINDINGS: Cardiac shadow is enlarged but stable. Endotracheal tube and nasogastric catheter are again seen. Mild basilar atelectatic changes are noted new from the prior study. Some fullness of the pulmonary artery is noted centrally stable from the previous exam. No other focal abnormality is noted. IMPRESSION: New bibasilar atelectatic changes. Electronically Signed   By: Inez Catalina M.D.   On: 08/23/2017 09:19   Dg Chest Port  1 View  Result Date: 08/22/2017 CLINICAL DATA:  Endotracheal tube placement EXAM: PORTABLE CHEST 1 VIEW COMPARISON:  03/12/2015 FINDINGS: Endotracheal tube in good position.  NG tube in the stomach. Bibasilar atelectasis.  Negative for edema or effusion IMPRESSION: Endotracheal tube in good position.  Bibasilar atelectasis. Electronically Signed   By: Franchot Gallo M.D.   On: 08/22/2017 15:31    Discharge Exam: Blood pressure 128/83, pulse 75, temperature 98.8 F (37.1 C), temperature source Oral, resp. rate 19, height 5\' 1"  (1.549 m), weight 168 lb 14 oz (76.6 kg), SpO2 100 %. Physical Exam  Constitutional: She appears well-developed and well-nourished. No distress.  Cardiovascular: Normal rate, regular rhythm and normal heart sounds.  No murmur heard. Pulmonary/Chest: Effort normal and breath sounds normal. No respiratory distress. She has no wheezes.  Neurological:  Alert, awake, and oriented x3. Speech and comprehension intact. PERRL bilaterally. EOMs intact bilaterally without nystagmus or subjective diplopia. Visual fields intact bilaterally. No facial asymmetry. Tongue midline. Motor power symmetric proportional to effort. No pronator drift. Fine motor and coordination intact and symmetric. Gait not assessed. Romberg not assessed. Heel to toe not assessed. Distal pulses palpable with Doppler bilaterally.  Skin: Skin is warm and dry.  Psychiatric: She has a normal mood and affect. Her behavior is normal. Judgment and thought content normal.  Nursing note and vitals reviewed.   Disposition: Discharge disposition: 01-Home or Self Care       Discharge Instructions    Call MD for:  difficulty breathing, headache or visual disturbances   Complete by:  As directed    Call MD for:  extreme fatigue   Complete by:  As directed    Call MD for:  hives   Complete  by:  As directed    Call MD for:  persistant dizziness or light-headedness   Complete by:  As directed    Call  MD for:  persistant nausea and vomiting   Complete by:  As directed    Call MD for:  redness, tenderness, or signs of infection (pain, swelling, redness, odor or green/yellow discharge around incision site)   Complete by:  As directed    Call MD for:  severe uncontrolled pain   Complete by:  As directed    Call MD for:  temperature >100.4   Complete by:  As directed    Diet - low sodium heart healthy   Complete by:  As directed    Discharge instructions   Complete by:  As directed    Continue taking Plavix 75 mg twice daily. Continue taking Aspirin 81 mg once daily. No bending, stooping, or lifting more than 10 pounds for 2 weeks. No driving self for 2 weeks. Stay hydrated by drinking plenty of water.   Increase activity slowly   Complete by:  As directed      Allergies as of 08/24/2017      Reactions   Dilaudid [hydromorphone] Swelling   SWELLING REACTION UNSPECIFIED    Latex Swelling   gloves used at dental office caused lips to swell. Used different ones no problem. Never had any problem at hospital or anywhere else.   Lidocaine Other (See Comments)   "makes my heart race"   Sulfa Drugs Cross Reactors Other (See Comments)   UNSPECIFIED REACTION OF CHILDHOOD   Codeine Nausea And Vomiting      Medication List    TAKE these medications   albuterol 108 (90 Base) MCG/ACT inhaler Commonly known as:  PROVENTIL HFA;VENTOLIN HFA Inhale 1-2 puffs into the lungs every 6 (six) hours as needed for wheezing or shortness of breath.   aspirin EC 81 MG tablet Take 81 mg by mouth daily with lunch.   ciprofloxacin 500 MG tablet Commonly known as:  CIPRO Take 1 tablet (500 mg total) by mouth 2 (two) times daily.   clopidogrel 75 MG tablet Commonly known as:  PLAVIX Take 75 mg by mouth every morning.   lisinopril-hydrochlorothiazide 20-12.5 MG tablet Commonly known as:  PRINZIDE,ZESTORETIC Take 1 tablet by mouth daily. What changed:  when to take this   TOPROL XL 50 MG 24 hr  tablet Generic drug:  metoprolol succinate Take 1/2 tablet by mouth twice daily.  Take with or immediately following a meal. What changed:    how much to take  how to take this  when to take this  additional instructions      Follow-up Information    [name], MD Follow up in 2 week(s).   Specialties:  Interventional Radiology, Radiology Why:  Please follow-up with Dr. [name] in clinic 2 weeks after discharge. Contact information: [address] [place] [address] [phone]            Electronically Signed: Earley Abide, PA-C 08/24/2017, 2:47 PM   I have spent Less Than 30 Minutes discharging [name].

## 2017-08-24 NOTE — Evaluation (Signed)
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 intubated 7/31-8/1.   Clinical Impression  Pt is close to baseline functioning with some mild LE weakness and should be safe at home with available family assist. There are no further acute PT needs.  Will sign off at this time.     Follow Up Recommendations No PT follow up    Equipment Recommendations  None recommended by PT    Recommendations for Other Services       Precautions / Restrictions Precautions Precautions: Fall Restrictions Weight Bearing Restrictions: No      Mobility  Bed Mobility               General bed mobility comments: Pt OOB in recliner when OT entered  Transfers Overall transfer level: Needs assistance Equipment used: Straight cane Transfers: Sit to/from Stand Sit to Stand: Supervision         General transfer comment: no physical assist needed, supervision for safety  Ambulation/Gait Ambulation/Gait assistance: Supervision Gait Distance (Feet): 75 Feet Assistive device: Straight cane Gait Pattern/deviations: Step-through pattern Gait velocity: slower, but can speed up moderately Gait velocity interpretation: 1.31 - 2.62 ft/sec, indicative of limited community ambulator General Gait Details: steady with appropriate use of the cane to stay safe.  pt able to scan and hold conversation, change speeds and all with no overt deviation.  Stairs Stairs: Yes Stairs assistance: Min guard Stair Management: One rail Left;Step to pattern;Forwards Number of Stairs: 4 General stair  comments: Generally steady with the rail.  Used step to pattern opposite from what would be taught with a weaker LE, but she faired well with the rail.  Wheelchair Mobility    Modified Rankin (Stroke Patients Only)       Balance Overall balance assessment: Mild deficits observed, not formally tested                                           Pertinent Vitals/Pain Pain Assessment: Faces Faces Pain Scale: Hurts a little bit Pain Location: incision site/throat Pain Descriptors / Indicators: Discomfort Pain Intervention(s): Monitored during session    Home Living Family/patient expects to be discharged to:: Private residence Living Arrangements: Alone Available Help at Discharge: Family;Friend(s);Available 24 hours/day Type of Home: House Home Access: Stairs to enter Entrance Stairs-Rails: Psychiatric nurse of Steps: 2 Home Layout: One level Home Equipment: Cane - single point;Grab bars - tub/shower;Tub bench;Hand held shower head;Walker - 2 wheels;Wheelchair - manual;Grab bars - toilet      Prior Function Level of Independence: Independent with assistive device(s)         Comments: uses SPC for mobility PRN, still drives, grocery shops and cooks - enjoys music and still is active at [employer] [place]: Right    Extremity/Trunk Assessment   Upper Extremity Assessment Upper Extremity Assessment: Overall WFL for tasks assessed    Lower Extremity Assessment Lower Extremity Assessment: Overall WFL for tasks assessed(left proximal muscle mildly weaker than right, not baseline)  Communication   Communication: No difficulties  Cognition Arousal/Alertness: Awake/alert Behavior During Therapy: WFL for tasks assessed/performed Overall Cognitive Status: Within Functional Limits for tasks assessed                                        General Comments General comments (skin integrity, edema,  etc.): Pt's granddaughter present throughout session    Exercises     Assessment/Plan    PT Assessment Patent does not need any further PT services  PT Problem List         PT Treatment Interventions      PT Goals (Current goals can be found in the Care Plan section)  Acute Rehab PT Goals Patient Stated Goal: to get back to going to the symphony PT Goal Formulation: With patient    Frequency     Barriers to discharge        Co-evaluation PT/OT/SLP Co-Evaluation/Treatment: Yes Reason for Co-Treatment: For patient/therapist safety PT goals addressed during session: Mobility/safety with mobility OT goals addressed during session: ADL's and self-care       AM-PAC PT "6 Clicks" Daily Activity  Outcome Measure Difficulty turning over in bed (including adjusting bedclothes, sheets and blankets)?: None Difficulty moving from lying on back to sitting on the side of the bed? : None Difficulty sitting down on and standing up from a chair with arms (e.g., wheelchair, bedside commode, etc,.)?: None Help needed moving to and from a bed to chair (including a wheelchair)?: A Little Help needed walking in hospital room?: A Little Help needed climbing 3-5 steps with a railing? : A Little 6 Click Score: 21    End of Session   Activity Tolerance: Patient tolerated treatment well Patient left: in chair;with call bell/phone within reach;with family/visitor present Nurse Communication: Mobility status PT Visit Diagnosis: Muscle weakness (generalized) (M62.81)    Time: 0938-1829 PT Time Calculation (min) (ACUTE ONLY): 26 min   Charges:   PT Evaluation $PT Eval Moderate Complexity: 1 Mod          08/24/2017  Donnella Sham, PT [phone]  (pager)  Jeanne Lawson 08/24/2017, 5:22 PM

## 2017-08-24 NOTE — Progress Notes (Addendum)
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  Patient was left intubated postop for airway protection given direct carotid access and need for postop IV heparin.  PCCM consulted for overnight vent management.  Of note she had similar procedure in 2016 with plans for overnight vent support but did not wean immediately, required 3-4 days vent.   STUDIES: No new studies, chest x-ray was essentially okay  CULTURES: None  ANTIBIOTICS: Rocephin given postprocedure as a prophylaxis   SIGNIFICANT EVENTS: 7/31>> R ICA aneurysm embolization (deveshwar) with direct carotid access/puncture (VVS)   LINES/TUBES: ETT 7/31>>> A-line 08/22/2017>> 8/2  SUBJECTIVE: No significant overnight event, seen by vascular and interventional radiology felt that there is no evidence of any bleeding  Extubated x 24 hours On RA with sats of 93% Plans for discharge home today 8/2  VITAL SIGNS: BP (!) 149/68   Pulse 64   Temp 97.7 F (36.5 C) (Oral)   Resp (!) 23   Ht 5\' 1"  (1.549 m)   Wt 168 lb 14 oz (76.6 kg)   SpO2 93%   BMI 31.91 kg/m   HEMODYNAMICS:    VENTILATOR SETTINGS:    INTAKE / OUTPUT: I/O last 3 completed shifts: In: 3148 [P.O.:240; I.V.:1388.8; IV Piggyback:1519.2] Out: 2355 [Urine:2355]  PHYSICAL EXAMINATION: General:  Chronically ill appearing female, OOB in chair, awake , alert and appropriate Neuro: Awake , A&O x 3, MAE x 4 and following commands HEENT:NCAT,   Mm  moist,R neck incision c/d/i Cardiovascular:  s1s2 rrr, No RMG, NSR per tele Lungs:  Bilateral chest excursion,  Clear throughout, diminished per bases,   Abdomen:  Round, soft, ND, NT, Body mass index is 31.91 kg/m. Musculoskeletal:  Warm and dry, chronic 1-2+ BLE edema, normal bulk   LABS:  BMET Recent Labs  Lab 08/22/17 1502 08/23/17 0519 08/24/17 0604  NA 138 140 140  K 3.0* 3.4* 3.5  CL 107 106 107  CO2 22 23 25   BUN 6* 9 14  CREATININE 0.60 0.83 0.85  GLUCOSE 124* 149* 92    Electrolytes Recent Labs  Lab 08/22/17 1502 08/23/17 0519 08/24/17 0604  CALCIUM 7.0* 7.8* 7.8*  MG 1.7 1.8 2.2  PHOS  --   --  2.6    CBC Recent Labs  Lab 08/22/17 1502 08/23/17 0519 08/24/17 0604  WBC 3.5* 5.5 4.9  HGB 8.6* 8.6* 7.9*  HCT 26.3* 26.9* 25.9*  PLT 95* 107* 116*    Coag's Recent Labs  Lab 08/23/17 0519  APTT 42*  INR 1.13    Sepsis Markers No results for input(s): LATICACIDVEN, PROCALCITON, O2SATVEN in the last 168 hours.  ABG Recent Labs  Lab 08/22/17 1501 08/23/17 0404 08/23/17 0850  PHART 7.405 7.414 7.436  PCO2ART 42.8 35.4 32.5  PO2ART 309.0* 114.0* 117*    Liver Enzymes Recent Labs  Lab 08/22/17 1502 08/23/17 0519  AST 22 21  ALT 13 12  ALKPHOS 27* 27*  BILITOT 1.2 0.6  ALBUMIN 2.5* 2.7*    Cardiac Enzymes No  results for input(s): TROPONINI, PROBNP in the last 168 hours.  Glucose Recent Labs  Lab 08/23/17 0330 08/23/17 0804 08/23/17 1224 08/23/17 1718 08/23/17 1933 08/23/17 2348  GLUCAP 103* 110* 89 75 111* 87    Imaging No results found.     DISCUSSION: 72yo female with known R ICA aneurysm admitted for neuro IR repair (with direct operative carotid access per VVS) to remain intubated overnight for airway protection r/t need for IV heparin post direct carotid puncture.   ASSESSMENT / PLAN:   Intubation/ need for airway protection >> extubated 8/1 Hx COPD For d/c home 8/2  States she uses her albuterol prn  about once a week Doing well on RA P:   Extubated 8/1 Aspiration precaution No severe neck swelling noted Monitor for neck/airway swelling  Continue albuterol inhaler prn for SOB or wheezing   Hx HTN  Hx CAD  P:  Blood pressure improved - Monitor blood pressure if blood pressure allows resume home medication like lisinopril and metoprolol after bedside swallow - Continue Plavix as outlined by interventional radiology   R ICA aneurysm - s/p neuro IR repair with direct carotid access by VVS  P:  H&H remains 7.9 Follow up HGB checks post discharge per primary team Anticoagulation per pharmacy/ primary team plavix per Neuro IR  Monitor closely for bleeding  Neuro checks   Renal: Follow up renal function studies post discharge per primary team  Pt. Has done well since extubation on 8/1. She is being discharged home on RA with her prn albuterol prn Discharge instructions and Follow up per Primary Team    Skin/Wound: chronic changes   Electrolytes: Replace electrolytes per ICU electrolyte replacement protocol.   IVF: none  Nutrition: NPO for now  Prophylaxis: DVT Prophylaxis with Heparin,. GI Prophylaxis.   Restraints: Soft limb  PT/OT eval and treat. OOB when appropriate.   Lines/Tubes:  No foley  No  central line. A line   ADVANCE DIRECTIVE:Full code  FAMILY DISCUSSION:spoke with family IR and Nursing  Quality Care: PPI, DVT prophylaxis, HOB elevated, Infection control all reviewed and addressed.  Events and notes from last 24 hours reviewed. Care plan discussed on multidisciplinary rounds   Magdalen Spatz, [facility] Pager # [phone] 08/24/2017 10:39 AM

## 2017-08-24 NOTE — Care Management Important Message (Signed)
Important Message  Patient Details  Name: Jeanne Lawson MRN: [id number] Date of Birth: [birth year]   Medicare Important Message Given:  Yes    Ella Bodo, RN 08/24/2017, 12:40 PM

## 2017-08-24 NOTE — Telephone Encounter (Signed)
Sched. Appt. Not avb. 09/06/17 2pm p/o PA

## 2017-08-24 NOTE — Evaluation (Signed)
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 intubated 7/31-8/1.    Clinical Impression   PTA Pt independent in ADL and mobility (SPC PRN), Pt is very close to baseline currently supervision for safety. Educated on using her tub bench initially when home (or at daughters house). Pt able to demonstrate multiple ADL including LB dressing, transfers. NO questions or concerns from Pt or family, OT to sign off at this time. Thank you for the opportunity to serve this patient.     Follow Up Recommendations  No OT follow up    Equipment Recommendations  None recommended by OT    Recommendations for Other Services       Precautions / Restrictions Precautions Precautions: Fall Restrictions Weight Bearing Restrictions: No      Mobility Bed Mobility               General bed mobility comments: Pt OOB in recliner when OT entered  Transfers Overall transfer level: Needs assistance Equipment used: Straight cane Transfers: Sit to/from Stand Sit to Stand: Supervision         General transfer comment: no physical assist needed, supervision for safety    Balance Overall balance assessment: Mild deficits observed, not formally tested                                         ADL either performed or assessed with clinical judgement   ADL Overall ADL's : Needs assistance/impaired Eating/Feeding: Independent   Grooming: Supervision/safety;Standing   Upper Body Bathing: Supervision/ safety;Sitting   Lower Body Bathing:  Supervison/ safety;Sitting/lateral leans   Upper Body Dressing : Modified independent   Lower Body Dressing: Supervision/safety;Sit to/from stand Lower Body Dressing Details (indicate cue type and reason): able to get socks on sitting in recliner Toilet Transfer: Supervision/safety(SPC)   Toileting- Clothing Manipulation and Hygiene: Supervision/safety;Sit to/from stand   Tub/ Banker: Tub Buyer, retail Details (indicate cue type and reason): educated on shower safety, Pt agrees to use tub bench until back to 100% again Functional mobility during ADLs: Supervision/safety;Cane General ADL Comments: overall supervision     Vision Patient Visual Report: No change from baseline Vision Assessment?: No apparent visual deficits     Perception     Praxis      Pertinent Vitals/Pain Pain Assessment: Faces Faces Pain Scale: Hurts a little bit Pain Location: incision site/throat Pain Descriptors / Indicators: Discomfort Pain Intervention(s): Monitored during session     Hand Dominance Right   Extremity/Trunk Assessment Upper Extremity Assessment Upper Extremity Assessment: Overall WFL for tasks assessed(baseline L<R due to prior CVA)   Lower Extremity Assessment Lower Extremity Assessment: Defer to PT evaluation       Communication Communication Communication: No difficulties   Cognition Arousal/Alertness: Awake/alert Behavior During Therapy: WFL for tasks assessed/performed Overall Cognitive Status: Within Functional Limits for tasks assessed  General Comments  Pt's granddaughter present throughout session    Exercises     Shoulder Bryant expects to be discharged to:: Private residence Living Arrangements: Alone Available Help at Discharge: Family;Friend(s);Available 24 hours/day Type of Home: House Home Access: Stairs to enter [employer] of  Steps: 2 Entrance Stairs-Rails: Right;Left Home Layout: One level     Bathroom Shower/Tub: Teacher, early years/pre: (comfort)     Home Equipment: Cane - single point;Grab bars - tub/shower;Tub bench;Hand held shower head;Walker - 2 wheels;Wheelchair - manual;Grab bars - toilet          Prior Functioning/Environment Level of Independence: Independent with assistive device(s)        Comments: uses SPC for mobility PRN, still drives, grocery shops and cooks - enjoys music and still is active at [employer]        OT Problem List:        OT Treatment/Interventions:      OT Goals(Current goals can be found in the care plan section) Acute Rehab OT Goals Patient Stated Goal: to get back to going to the symphony OT Goal Formulation: With patient/family Time For Goal Achievement: 09/07/17 Potential to Achieve Goals: Good  OT Frequency:     Barriers to D/C:            Co-evaluation PT/OT/SLP Co-Evaluation/Treatment: Yes Reason for Co-Treatment: For patient/therapist safety;To address functional/ADL transfers PT goals addressed during session: Mobility/safety with mobility;Balance;Proper use of DME OT goals addressed during session: ADL's and self-care;Strengthening/ROM      AM-PAC PT "6 Clicks" Daily Activity     Outcome Measure Help from another person eating meals?: None Help from another person taking care of personal grooming?: None Help from another person toileting, which includes using toliet, bedpan, or urinal?: None Help from another person bathing (including washing, rinsing, drying)?: A Little Help from another person to put on and taking off regular upper body clothing?: None Help from another person to put on and taking off regular lower body clothing?: None 6 Click Score: 23   End of Session Equipment Utilized During Treatment: Gait belt;Other (comment)(single point cane) Nurse Communication: Mobility status  Activity Tolerance: Patient tolerated  treatment well Patient left: in chair;with call bell/phone within reach;with family/visitor present                   Time: 9166-0600 OT Time Calculation (min): 26 min Charges:  OT General Charges $OT Visit: 1 Visit OT Evaluation $OT Eval Low Complexity: 1 Low  Hulda Humphrey OTR/L [place] 08/24/2017, 1:59 PM

## 2017-08-26 LAB — GLUCOSE, CAPILLARY
Glucose-Capillary: 82 mg/dL (ref 70–99)
Glucose-Capillary: 86 mg/dL (ref 70–99)
Glucose-Capillary: 73 mg/dL (ref 70–99)

## 2017-08-30 ENCOUNTER — Encounter (HOSPITAL_COMMUNITY): Payer: Self-pay | Admitting: Interventional Radiology

## 2017-08-30 ENCOUNTER — Other Ambulatory Visit (HOSPITAL_COMMUNITY): Payer: Self-pay | Admitting: Interventional Radiology

## 2017-08-30 DIAGNOSIS — I729 Aneurysm of unspecified site: Secondary | ICD-10-CM

## 2017-09-06 ENCOUNTER — Other Ambulatory Visit: Payer: Self-pay

## 2017-09-06 ENCOUNTER — Ambulatory Visit (INDEPENDENT_AMBULATORY_CARE_PROVIDER_SITE_OTHER): Payer: Self-pay | Admitting: Physician Assistant

## 2017-09-06 VITALS — BP 100/63 | HR 64 | Temp 98.2°F | Resp 16 | Ht 61.0 in | Wt 157.0 lb

## 2017-09-06 DIAGNOSIS — I671 Cerebral aneurysm, nonruptured: Secondary | ICD-10-CM

## 2017-09-06 NOTE — Progress Notes (Signed)
POST OPERATIVE OFFICE NOTE    CC:  F/u for surgery  HPI: History of Present Illness:  Patient is a 72 y.o. year old female who presents for evaluation of her carotid arteries. She is referred by Dr. [name] for consideration of open exposure of her common carotid artery to treat intracranial aneurysms with coiling/stenting. She has known intracranial aneurysms. Dr. Angelica Chessman is currently evaluating her for an endovascular intervention. However due to tortuosities and shape of her aortic arch a femoral approach is not possible. She has asked Dr. Oneida Alar to see the patient for possibility of exposing the common carotid artery for exposure for his intervention.   S/p Dr. Oneida Alar Exposure of right common carotid artery placement of 6 French sheath, right carotid Angio and Dr. Donnetta Hutching removed the sheath after intervention.  Dr. [name] placed pipeline flex 4.75 x 20 mm diverter device.    She states that a few days after she was discharged home she had some dark bloody drainage from the distal right neck incision.  She had developed fullness/mass and since the bleeding the fullness has gone away.  She denise pain, amaurosis, weakness above her baseline post stroke history.  She dsnise fever and chills.    Allergies  Allergen Reactions  . Dilaudid [Hydromorphone] Swelling    SWELLING REACTION UNSPECIFIED   . Latex Swelling    gloves used at dental office caused lips to swell. Used different ones no problem. Never had any problem at hospital or anywhere else.  . Lidocaine Other (See Comments)    "makes my heart race"  . Sulfa Drugs Cross Reactors Other (See Comments)    UNSPECIFIED REACTION OF CHILDHOOD  . Codeine Nausea And Vomiting    Current Outpatient Medications  Medication Sig Dispense Refill  . albuterol (PROVENTIL HFA;VENTOLIN HFA) 108 (90 BASE) MCG/ACT inhaler Inhale 1-2 puffs into the lungs every 6 (six) hours as needed for wheezing or shortness of breath.    Marland Kitchen aspirin EC 81 MG  tablet Take 81 mg by mouth daily with lunch.     . clopidogrel (PLAVIX) 75 MG tablet Take 75 mg by mouth every morning.   3  . lisinopril-hydrochlorothiazide (PRINZIDE,ZESTORETIC) 20-12.5 MG tablet Take 1 tablet by mouth daily. (Patient taking differently: Take 1 tablet by mouth daily with lunch. ) 30 tablet 11  . TOPROL XL 50 MG 24 hr tablet Take 1/2 tablet by mouth twice daily.  Take with or immediately following a meal. (Patient taking differently: Take 25 mg by mouth 2 (two) times daily. Take 1/2 tablet by mouth twice daily.  Take with or immediately following a meal.) 90 tablet 3  . ciprofloxacin (CIPRO) 500 MG tablet Take 1 tablet (500 mg total) by mouth 2 (two) times daily. (Patient not taking: Reported on 09/06/2017) 20 tablet 0   No current facility-administered medications for this visit.      ROS:  See HPI  Physical Exam:  Vitals:   09/06/17 1404 09/06/17 1408  BP: 123/69 100/63  Pulse: 64 64  Resp: 16   Temp: 98.2 F (36.8 C)   SpO2: 97%     Incision:  Well healed with out evidence of hematoma or drainage.  No erythema and non tender to palpation. Extremities:  Grip 5/5 equal B, palpable radial pulses B Heart RRR Abdomen:  Soft + BS Lungs CTA  Assessment/Plan:  This is a 72 y.o. female who is s/p: Open exposure of right common carotid artery for intracranial intervention  At discharge she was  taking 81 mg aspirin, prior to the procedure she was placed on Plavix.  She will call Dr. [name] and ask him if she needs to be on Plavix.    The bloody drainage was likely from a small hematoma.  This has resolved.  She reports no new symptoms of stroke and back to baseline with her activity.  She will f/u PRN.   Roxy Horseman , PA-C [facility] [phone]

## 2017-09-11 ENCOUNTER — Ambulatory Visit (HOSPITAL_COMMUNITY): Payer: Medicare Other

## 2017-09-14 ENCOUNTER — Ambulatory Visit (HOSPITAL_COMMUNITY): Payer: Medicare Other

## 2017-09-20 ENCOUNTER — Ambulatory Visit (HOSPITAL_COMMUNITY)
Admission: RE | Admit: 2017-09-20 | Discharge: 2017-09-20 | Disposition: A | Discharge status: Home / Self Care | Payer: Medicare Other | Source: Ambulatory Visit | Attending: Interventional Radiology | Admitting: Interventional Radiology

## 2017-09-20 DIAGNOSIS — I729 Aneurysm of unspecified site: Secondary | ICD-10-CM

## 2017-09-20 NOTE — Progress Notes (Signed)
Chief Complaint: Brain aneurysm, Cerebral aneurysm  Supervising Physician: [name]  Patient Status: [facility] - Out-pt    History of Present Illness: Jeanne Lawson is a 72 y.o. female with medical history significant for hypertension, COPD, TIA (04/2009), stroke 10/2008, hypertension, CAD, PAC, and COPD.  She is well known to neuro IR service and is followed by Dr. [name].  She has previously undergone right ICA PCOM aneurysm embolization on 08/12/2014 and then  left ACA aneurysm embolization on 11/08/2014  F/u CTA of the head/neck done on 04/16/2017 showed: 1. 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 Note that the right posterior communicating artery remains patent and may arise from the aneurysm neck (series 8, image 100). Right ICA covered stent remains patent. 2. Stable and satisfactory post treatment appearance of the thrombosed Left ICA/A1 aneurysm. Left ICA covered stent remains patent. 3. Mild irregularity has developed in the left MCA branches since 2017 and might reflect increased atherosclerosis. No associated stenosis. 4. Otherwise stable and negative anterior and posterior circulation. 5. Minor atherosclerosis in the neck with no stenosis. 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 6. Stable CT appearance of the brain since 2017. 7. Stable Aortic Atherosclerosis (ICD10-I70.0), tortuous great vessels with atherosclerosis. Stable proximal right subclavian stenosis estimated at 50%.  She underwent image-guided cerebral angiogram with embolization of her right ICA PCOM aneurysm by Dr. [name] on 08/22/2017.   Exposure of the common carotid artery was done by Dr. Oneida Alar and closure was done by Dr. Donnetta Hutching.   She is here today for her post- op follow up.  She is taking 81 mg aspirin.  She tells me she is NOT  taking her Plavix daily because "no one told her to".  She tells me she feels great. She has good energy.   She denies any headache, tinnitus, visual changes, stroke/TIA symptoms.   Past Medical History:  Diagnosis Date  . Cancer (HCC)    right leg skin   . COPD (chronic obstructive pulmonary disease) ([place])   . Coronary artery disease 1996   Known with prior mild lesion of LAD demonstrated by Cardiac Catheterization in 1996  . Edema of foot    She has a history of chronic edema of the left dated back to age 60 when she suufered severe frostbite playing  on the snow as a child  . Hypertension   . PAC (premature atrial contraction)   . Pancreatitis    x2  . PONV (postoperative nausea and vomiting)    problems waking up last time 4/16 only time  . 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  Marland Kitchen Shortness of breath dyspnea    since stroke 2 months ago -  . 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 TIA (transient ischemic attack)    She was hospitalized 04-23-09 through 04-27-09 for involving right side of the body  . Tobacco abuse    Ongoing   . Ventricular hypertrophy 04/2009   LVH with diastolic dysfunction by echo. Has normal EF.    Past Surgical History:  Procedure Laterality Date  . ABDOMINAL HYSTERECTOMY    . APPENDECTOMY    .  CARDIAC CATHETERIZATION  1996   Mild CAD with vasospasm  . CARDIOVASCULAR STRESS TEST  12-02-2001   EF 70%  . CHOLECYSTECTOMY N/A 03/09/2016   Procedure: LAPAROSCOPIC CHOLECYSTECTOMY WITH INTRAOPERATIVE CHOLANGIOGRAM;  Surgeon: Georganna Skeans, MD;  Location: [place];  Service: General;  Laterality: N/A;  . ENDARTERECTOMY Right 08/12/2014   Procedure: RIGHT  COMMON CAROTID ARTERY EXPOSURE FOR INTERVENTIONAL RADIOLOGY PROCEDURE BY DR.DEVASHWAR,Insertion 6 FR sheath;  Surgeon:  Elam Dutch, MD;  Location: [place];  Service: Vascular;  Laterality: Right;  . ENDARTERECTOMY Right 08/12/2014   Procedure:  CAROTID  EXPOSURE CLOSURE RIGHT NECK, REPAIR RIGHT COMMON CAROTID ARTERY;  Surgeon: Elam Dutch, MD;  Location: [place];  Service: Vascular;  Laterality: Right;  . ENDARTERECTOMY Left 11/18/2014   Procedure: CAROTID EXPOSURE;  Surgeon: Elam Dutch, MD;  Location: [place];  Service: Vascular;  Laterality: Left;  . ENDARTERECTOMY Left 11/18/2014   Procedure: CLOSURE CAROTID;  Surgeon: Elam Dutch, MD;  Location: [place];  Service: Vascular;  Laterality: Left;  . ENDARTERECTOMY Right 08/22/2017   Procedure: EXPOSURE CAROTID ARTERY FOR [place] SURGERY;  Surgeon: Elam Dutch, MD;  Location: [facility] OR;  Service: Vascular;  Laterality: Right;  . IR ANGIO INTRA EXTRACRAN SEL INTERNAL CAROTID UNI R MOD SED  08/22/2017  . IR ANGIOGRAM FOLLOW UP STUDY  08/22/2017  . IR RADIOLOGIST EVAL & MGMT  06/26/2016  . IR RADIOLOGIST EVAL & MGMT  05/08/2017  . IR TRANSCATH/EMBOLIZ  08/22/2017  . LAPAROSCOPY    . NECK SURGERY  50 yrs ago   Left side tumor  . RADIOLOGY WITH ANESTHESIA N/A 05/25/2014   Procedure: RADIOLOGY WITH ANESTHESIA;  Surgeon: [name], MD;  Location: [place];  Service: Radiology;  Laterality: N/A;  . RADIOLOGY WITH ANESTHESIA N/A 08/12/2014   Procedure: RADIOLOGY WITH ANESTHESIA;  Surgeon: [name], MD;  Location: [place];  Service: Radiology;  Laterality: N/A;  . RADIOLOGY WITH ANESTHESIA N/A 03/15/2015   Procedure: MRI OF BRAIN WITHOUT CONTRAST;  Surgeon: Medication Radiologist, MD;  Location: [place];  Service: Radiology;  Laterality: N/A;  DR. TAT/MRI  . US ECHOCARDIOGRAPHY  04-26-2009   EF 65-70%  . VESICOVAGINAL FISTULA CLOSURE W/ TAH  25 yrs ago  . WOUND EXPLORATION Right 08/22/2017   Procedure: REMOVAL OF RIGHT COMMON CAROTID SHEATH AND WOUND CLOSURE;  Surgeon: Rosetta Posner, MD;  Location: [place];  Service: Vascular;  Laterality: Right;     Allergies: Dilaudid [hydromorphone]; Latex; Lidocaine; Sulfa drugs cross reactors; and Codeine  Medications: Prior to Admission medications   Medication Sig Start Date End Date Taking? Authorizing Provider  albuterol (PROVENTIL HFA;VENTOLIN HFA) 108 (90 BASE) MCG/ACT inhaler Inhale 1-2 puffs into the lungs every 6 (six) hours as needed for wheezing or shortness of breath.    [provider]  aspirin EC 81 MG tablet Take 81 mg by mouth daily with lunch.     [provider]  ciprofloxacin (CIPRO) 500 MG tablet Take 1 tablet (500 mg total) by mouth 2 (two) times daily. Patient not taking: Reported on 09/06/2017 08/14/17   Elam Dutch, MD  clopidogrel (PLAVIX) 75 MG tablet Take 75 mg by mouth every morning.  07/31/17   [provider]  lisinopril-hydrochlorothiazide (PRINZIDE,ZESTORETIC) 20-12.5 MG tablet Take 1 tablet by mouth daily. Patient taking differently: Take 1 tablet by mouth daily with lunch.  02/26/17   Dorothy Spark, MD  TOPROL XL 50 MG 24 hr tablet Take 1/2 tablet by mouth twice  daily.  Take with or immediately following a meal. Patient taking differently: Take 25 mg by mouth 2 (two) times daily. Take 1/2 tablet by mouth twice daily.  Take with or immediately following a meal. 04/11/17   Dorothy Spark, MD     Family History  Problem Relation Age of Onset  . Emphysema Father   . Diabetes Father   . Aneurysm Sister   . Stroke Mother   . Hypertension Mother   . Heart disease Mother   . Hypertension Daughter   . Thyroid disease Daughter     Social History   Socioeconomic History  . Marital status: Widowed    Spouse name: Not on file  . Number of children: Not on file  . Years of education: Not on file  . Highest education level: Not on file  Occupational History  . Not on file  Social Needs  . Financial resource strain: Not on file  . Food insecurity:    Worry: Not on file    Inability: Not on file  . Transportation needs:     Medical: Not on file    Non-medical: Not on file  Tobacco Use  . Smoking status: Former Smoker    Packs/day: 1.00    Years: 30.00    Pack years: 30.00    Types: Cigarettes    Last attempt to quit: 04/26/2014    Years since quitting: 3.4  . Smokeless tobacco: Never Used  Substance and Sexual Activity  . Alcohol use: No  . Drug use: No  . Sexual activity: Not on file  Lifestyle  . Physical activity:    Days per week: Not on file    Minutes per session: Not on file  . Stress: Not on file  Relationships  . Social connections:    Talks on phone: Not on file    Gets together: Not on file    Attends religious service: Not on file    Active member of club or organization: Not on file    Attends meetings of clubs or organizations: Not on file    Relationship status: Not on file  Other Topics Concern  . Not on file  Social History Narrative  . Not on file     Review of Systems: A 12 point ROS discussed and pertinent positives are indicated in the HPI above.  All other systems are negative. Review of Systems  Constitutional: Negative.   HENT: Negative for hearing loss and tinnitus.   Respiratory: Negative.   Cardiovascular: Negative.   Gastrointestinal: Negative.   Genitourinary: Negative.   Musculoskeletal: Negative.   Skin: Negative.   Neurological: Negative for dizziness, syncope, speech difficulty, weakness, light-headedness and headaches.  Psychiatric/Behavioral: Negative.     Physical Exam  Constitutional: She is oriented to person, place, and time. She appears well-developed.  HENT:  Head: Normocephalic and atraumatic.  Eyes: EOM are normal.  Neck: Normal range of motion.  Cardiovascular: Normal rate, regular rhythm and normal heart sounds.  Pulmonary/Chest: Effort normal and breath sounds normal.  Abdominal: Soft. She exhibits no distension. There is no tenderness.  Musculoskeletal: Normal range of motion.  Neurological: She is alert and oriented to person,  place, and time. No cranial nerve deficit.  Skin: Skin is warm and dry.  Psychiatric: She has a normal mood and affect. Her behavior is normal. Judgment and thought content normal.    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 EXAM: ENDOVASCULAR TREATMENT OF RIGHT INTERNAL CAROTID ARTERY POSTERIOR COMMUNICATING REGION ANEURYSM WITH PIPELINE FLOW DIVERTER DEVICE. MEDICATIONS: Ancef 2 g IV was administered within an appropriate time interval prior to skin puncture. ANESTHESIA/SEDATION: General anesthesia. FLUOROSCOPY TIME:  Fluoroscopy Time: 26 minutes 43 seconds (1448 mGy). COMPLICATIONS: None immediate. PROCEDURE: Informed written consent was obtained from the patient after a thorough discussion of the procedural risks, benefits and alternatives. All questions were addressed. Maximal Sterile Barrier Technique was utilized including caps, mask, sterile gowns, sterile gloves, sterile drape, hand hygiene and skin antiseptic. A timeout was performed prior to the initiation of the procedure. A direct right common carotid artery approach was utilized. Access was obtained by vascular surgery with the placement of a 6 French Pinnacle sheath. The patient was then brought to the neuro angiography suite. The 6 French Pinnacle sheath was then prepped and cleaned with Betadine. Thereafter using biplane roadmap technique, and constant fluoroscopic guidance, over a 0.035 inch J-tip guidewire, it was positioned in the proximal 1/3 of the right internal carotid artery. Over this, a 5 French 115 cm Navien guide catheter was advanced and positioned in the mid 1/3 of the right internal carotid artery. The guidewire was removed. Good aspiration obtained from the hub of the 5 [place] Navien guide catheter. A gentle control arteriogram demonstrated no evidence of spasms, dissections or of intraluminal filling defects.  Intracranially no change was seen in the angio architecture. Using biplane roadmap technique and constant fluoroscopic guidance, in a coaxial manner and with constant heparinized saline infusion, a Phenom 150 cm microcatheter was advanced over a 0.014 inch Softip Synchro micro guidewire to petrous segment of the right internal carotid artery. Thereafter, with the micro guidewire leading with a J-tip configuration to avoid dissections or inducing spasm, the combination was navigated without difficulty into the dominant inferior division M1 M2 segment of the right middle cerebral artery followed by the microcatheter. The guidewire was removed. Good aspiration obtained from the Phenom microcatheter. A gentle contrast injection demonstrated no evidence of spasms or intraluminal filling defects. This was then connected to continuous heparinized saline infusion. The Navien guide catheter was then advanced into the petrous portion of the right internal carotid artery. Measurements had been performed of the proximal and the distal landing zone of the new flow diverter device. Continuous opacification of the previously treated wide neck aneurysm was noted in the right PCOM region though with slow inflow. It was decided to use a pipeline flex 4.75 x 20 mm diverter device. In a coaxial manner and with constant heparinized saline infusion using biplane roadmap technique and constant fluoroscopic guidance, the device was advanced to the distal end of the microcatheter. The O ring on the delivery microcatheter was then loosened. The entire system was then straightened. The tip of the microcatheter was now in the distal M1 segment of the right middle cerebral artery. With slight forward gentle traction with the right hand on the delivery micro guidewire, with the left hand the delivery microcatheter was retrieved unsheathing the distal portion of the device. Once the device changed from a cigar configuration to complete opening,  the combination was retrieved into the proximal right M1 segment and further unsheathing was undertaken. The combination was then retrieved until the distal portion of this device was just inside the previously positioned device. Thereafter further deployment of the device was undertaken using combination of the advancement of the micro guidewire followed by the advancement of the microcatheter with the end of the microcatheter being  centralized in the lumen of the internal carotid artery. Fluffing technique was utilized to ensure apposition to the parent vessel. This was continued until the entire device had been deployed. Once deployed, a control arteriogram performed through the 5 [place] Navien guide catheter demonstrated excellent apposition of the device into the previous device. There was now almost near complete stagnation of contrast in the aneurysm. Patency of the right posterior communicating artery was noted. The microcatheter was then advanced through the device into the right middle cerebral artery where the wire was captured. The combination was then retrieved and removed under constant observation. Control arteriograms were then performed at 15, 30 and 40 minutes post deployment of the device. These continued to demonstrate excellent flow without evidence of intraluminal filling defects within the device or intracranially. No occlusion was seen. Throughout the procedure, the patient's blood pressure and neurological status remained stable. The ACT was maintained in the region of approximately 200 seconds. A final control arteriogram performed through the Navien guide catheter in the right internal carotid artery demonstrated continued stagnation of contrast within the aneurysm, with no intraluminal filling defects or occlusions within the device, or intracranially. Patency of the middle cerebral artery and the right anterior cerebral artery was maintained. The 5 [place] Navien guide catheter was then  removed. The 6 French right common carotid sheath was then secured. The patient was then transferred to the OR to have the right carotid access site closed by vascular surgery. The patient was then transferred to the neuro ICU and left intubated for airway protection overnight under the care of Critical Care Medicine. The following morning, the patient's IV heparin was stopped. The patient was switched to aspirin and Plavix. Her sedation was then gradually weaned and the patient was extubated without difficulty. The patient maintained adequate oxygen saturations. Neurologically she remained stable with no new neurological signs or symptoms. The patient was then ambulated after assessment by physical therapy and social services. She was then discharged home the following morning under the care of her daughter. Instructions were given for the patient to maintain adequate hydration and to continue taking her dual antiplatelets. She was advised to avoid stooping, bending or lifting weights above 10 pounds. Questions were answered to their satisfaction. The patient was then discharged home under the care of her daughters. 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 FOLLOW UP:  The patient will be followed in the outpatient clinic 2 weeks following her discharge. Electronically Signed   By: [name] M.D.   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 EXAM: ENDOVASCULAR TREATMENT OF RIGHT INTERNAL CAROTID ARTERY POSTERIOR COMMUNICATING REGION ANEURYSM WITH PIPELINE FLOW DIVERTER DEVICE. MEDICATIONS: Ancef 2 g IV was administered within an appropriate time interval prior to skin puncture.  ANESTHESIA/SEDATION: General anesthesia. FLUOROSCOPY TIME:  Fluoroscopy Time: 26 minutes 43 seconds (1448 mGy). COMPLICATIONS: None immediate. PROCEDURE: Informed written consent was obtained from the patient after a thorough discussion of the procedural risks, benefits and alternatives. All questions were addressed. Maximal Sterile Barrier Technique was utilized including caps, mask, sterile gowns, sterile gloves, sterile drape, hand hygiene and skin antiseptic. A timeout was performed prior to the initiation of the procedure. A direct right common carotid artery approach was utilized. Access  was obtained by vascular surgery with the placement of a 6 French Pinnacle sheath. The patient was then brought to the neuro angiography suite. The 6 French Pinnacle sheath was then prepped and cleaned with Betadine. Thereafter using biplane roadmap technique, and constant fluoroscopic guidance, over a 0.035 inch J-tip guidewire, it was positioned in the proximal 1/3 of the right internal carotid artery. Over this, a 5 French 115 cm Navien guide catheter was advanced and positioned in the mid 1/3 of the right internal carotid artery. The guidewire was removed. Good aspiration obtained from the hub of the 5 [place] Navien guide catheter. A gentle control arteriogram demonstrated no evidence of spasms, dissections or of intraluminal filling defects. Intracranially no change was seen in the angio architecture. Using biplane roadmap technique and constant fluoroscopic guidance, in a coaxial manner and with constant heparinized saline infusion, a Phenom 150 cm microcatheter was advanced over a 0.014 inch Softip Synchro micro guidewire to petrous segment of the right internal carotid artery. Thereafter, with the micro guidewire leading with a J-tip configuration to avoid dissections or inducing spasm, the combination was navigated without difficulty into the dominant inferior division M1 M2 segment of the right middle cerebral artery  followed by the microcatheter. The guidewire was removed. Good aspiration obtained from the Phenom microcatheter. A gentle contrast injection demonstrated no evidence of spasms or intraluminal filling defects. This was then connected to continuous heparinized saline infusion. The Navien guide catheter was then advanced into the petrous portion of the right internal carotid artery. Measurements had been performed of the proximal and the distal landing zone of the new flow diverter device. Continuous opacification of the previously treated wide neck aneurysm was noted in the right PCOM region though with slow inflow. It was decided to use a pipeline flex 4.75 x 20 mm diverter device. In a coaxial manner and with constant heparinized saline infusion using biplane roadmap technique and constant fluoroscopic guidance, the device was advanced to the distal end of the microcatheter. The O ring on the delivery microcatheter was then loosened. The entire system was then straightened. The tip of the microcatheter was now in the distal M1 segment of the right middle cerebral artery. With slight forward gentle traction with the right hand on the delivery micro guidewire, with the left hand the delivery microcatheter was retrieved unsheathing the distal portion of the device. Once the device changed from a cigar configuration to complete opening, the combination was retrieved into the proximal right M1 segment and further unsheathing was undertaken. The combination was then retrieved until the distal portion of this device was just inside the previously positioned device. Thereafter further deployment of the device was undertaken using combination of the advancement of the micro guidewire followed by the advancement of the microcatheter with the end of the microcatheter being centralized in the lumen of the internal carotid artery. Fluffing technique was utilized to ensure apposition to the parent vessel. This was continued  until the entire device had been deployed. Once deployed, a control arteriogram performed through the 5 [place] Navien guide catheter demonstrated excellent apposition of the device into the previous device. There was now almost near complete stagnation of contrast in the aneurysm. Patency of the right posterior communicating artery was noted. The microcatheter was then advanced through the device into the right middle cerebral artery where the wire was captured. The combination was then retrieved and removed under constant observation. Control arteriograms were then performed at 15, 30 and 40 minutes post deployment  of the device. These continued to demonstrate excellent flow without evidence of intraluminal filling defects within the device or intracranially. No occlusion was seen. Throughout the procedure, the patient's blood pressure and neurological status remained stable. The ACT was maintained in the region of approximately 200 seconds. A final control arteriogram performed through the Navien guide catheter in the right internal carotid artery demonstrated continued stagnation of contrast within the aneurysm, with no intraluminal filling defects or occlusions within the device, or intracranially. Patency of the middle cerebral artery and the right anterior cerebral artery was maintained. The 5 [place] Navien guide catheter was then removed. The 6 French right common carotid sheath was then secured. The patient was then transferred to the OR to have the right carotid access site closed by vascular surgery. The patient was then transferred to the neuro ICU and left intubated for airway protection overnight under the care of Critical Care Medicine. The following morning, the patient's IV heparin was stopped. The patient was switched to aspirin and Plavix. Her sedation was then gradually weaned and the patient was extubated without difficulty. The patient maintained adequate oxygen saturations. Neurologically she  remained stable with no new neurological signs or symptoms. The patient was then ambulated after assessment by physical therapy and social services. She was then discharged home the following morning under the care of her daughter. Instructions were given for the patient to maintain adequate hydration and to continue taking her dual antiplatelets. She was advised to avoid stooping, bending or lifting weights above 10 pounds. Questions were answered to their satisfaction. The patient was then discharged home under the care of her daughters. 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 FOLLOW UP:  The patient will be followed in the outpatient clinic 2 weeks following her discharge. Electronically Signed   By: [name] M.D.   On: 08/27/2017 09:09   Dg Chest Port 1 View  Result Date: 08/23/2017 CLINICAL DATA:  Status post left ACA aneurysm repair EXAM: PORTABLE CHEST 1 VIEW COMPARISON:  08/22/2017 FINDINGS: Cardiac shadow is enlarged but stable. Endotracheal tube and nasogastric catheter are again seen. Mild basilar atelectatic changes are noted new from the prior study. Some fullness of the pulmonary artery is noted centrally stable from the previous exam. No other focal abnormality is noted. IMPRESSION: New bibasilar atelectatic changes. Electronically Signed   By: Inez Catalina M.D.   On: 08/23/2017 09:19   Dg Chest Port 1 View  Result Date: 08/22/2017 CLINICAL DATA:  Endotracheal tube placement EXAM: PORTABLE CHEST 1 VIEW COMPARISON:  03/12/2015 FINDINGS: Endotracheal tube in good position.  NG tube in the stomach. Bibasilar atelectasis.  Negative for edema or effusion IMPRESSION: Endotracheal tube in good position.  Bibasilar atelectasis. Electronically Signed   By: Franchot Gallo M.D.   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 EXAM: ENDOVASCULAR TREATMENT OF RIGHT INTERNAL CAROTID ARTERY POSTERIOR COMMUNICATING REGION ANEURYSM WITH PIPELINE FLOW DIVERTER DEVICE. MEDICATIONS: Ancef 2 g IV was administered within an appropriate time interval prior to skin puncture. ANESTHESIA/SEDATION: General anesthesia. FLUOROSCOPY TIME:  Fluoroscopy Time: 26 minutes 43 seconds (1448 mGy). COMPLICATIONS: None immediate. PROCEDURE: Informed written consent was obtained from the patient after a thorough discussion of the procedural risks, benefits and  alternatives. All questions were addressed. Maximal Sterile Barrier Technique was utilized including caps, mask, sterile gowns, sterile gloves, sterile drape, hand hygiene and skin antiseptic. A timeout was performed prior to the initiation of the procedure. A direct right common carotid artery approach was utilized. Access was obtained by vascular surgery with the placement of a 6 French Pinnacle sheath. The patient was then brought to the neuro angiography suite. The 6 French Pinnacle sheath was then prepped and cleaned with Betadine. Thereafter using biplane roadmap technique, and constant fluoroscopic guidance, over a 0.035 inch J-tip guidewire, it was positioned in the proximal 1/3 of the right internal carotid artery. Over this, a 5 French 115 cm Navien guide catheter was advanced and positioned in the mid 1/3 of the right internal carotid artery. The guidewire was removed. Good aspiration obtained from the hub of the 5 [place] Navien guide catheter. A gentle control arteriogram demonstrated no evidence of spasms, dissections or of intraluminal filling defects. Intracranially no change was seen in the angio architecture. Using biplane roadmap technique and constant fluoroscopic guidance, in a coaxial manner and with constant heparinized saline  infusion, a Phenom 150 cm microcatheter was advanced over a 0.014 inch Softip Synchro micro guidewire to petrous segment of the right internal carotid artery. Thereafter, with the micro guidewire leading with a J-tip configuration to avoid dissections or inducing spasm, the combination was navigated without difficulty into the dominant inferior division M1 M2 segment of the right middle cerebral artery followed by the microcatheter. The guidewire was removed. Good aspiration obtained from the Phenom microcatheter. A gentle contrast injection demonstrated no evidence of spasms or intraluminal filling defects. This was then connected to continuous heparinized saline infusion. The Navien guide catheter was then advanced into the petrous portion of the right internal carotid artery. Measurements had been performed of the proximal and the distal landing zone of the new flow diverter device. Continuous opacification of the previously treated wide neck aneurysm was noted in the right PCOM region though with slow inflow. It was decided to use a pipeline flex 4.75 x 20 mm diverter device. In a coaxial manner and with constant heparinized saline infusion using biplane roadmap technique and constant fluoroscopic guidance, the device was advanced to the distal end of the microcatheter. The O ring on the delivery microcatheter was then loosened. The entire system was then straightened. The tip of the microcatheter was now in the distal M1 segment of the right middle cerebral artery. With slight forward gentle traction with the right hand on the delivery micro guidewire, with the left hand the delivery microcatheter was retrieved unsheathing the distal portion of the device. Once the device changed from a cigar configuration to complete opening, the combination was retrieved into the proximal right M1 segment and further unsheathing was undertaken. The combination was then retrieved until the distal portion of this device was  just inside the previously positioned device. Thereafter further deployment of the device was undertaken using combination of the advancement of the micro guidewire followed by the advancement of the microcatheter with the end of the microcatheter being centralized in the lumen of the internal carotid artery. Fluffing technique was utilized to ensure apposition to the parent vessel. This was continued until the entire device had been deployed. Once deployed, a control arteriogram performed through the 5 [place] Navien guide catheter demonstrated excellent apposition of the device into the previous device. There was now almost near complete stagnation of contrast in the aneurysm. Patency of the right posterior  communicating artery was noted. The microcatheter was then advanced through the device into the right middle cerebral artery where the wire was captured. The combination was then retrieved and removed under constant observation. Control arteriograms were then performed at 15, 30 and 40 minutes post deployment of the device. These continued to demonstrate excellent flow without evidence of intraluminal filling defects within the device or intracranially. No occlusion was seen. Throughout the procedure, the patient's blood pressure and neurological status remained stable. The ACT was maintained in the region of approximately 200 seconds. A final control arteriogram performed through the Navien guide catheter in the right internal carotid artery demonstrated continued stagnation of contrast within the aneurysm, with no intraluminal filling defects or occlusions within the device, or intracranially. Patency of the middle cerebral artery and the right anterior cerebral artery was maintained. The 5 [place] Navien guide catheter was then removed. The 6 French right common carotid sheath was then secured. The patient was then transferred to the OR to have the right carotid access site closed by vascular surgery. The  patient was then transferred to the neuro ICU and left intubated for airway protection overnight under the care of Critical Care Medicine. The following morning, the patient's IV heparin was stopped. The patient was switched to aspirin and Plavix. Her sedation was then gradually weaned and the patient was extubated without difficulty. The patient maintained adequate oxygen saturations. Neurologically she remained stable with no new neurological signs or symptoms. The patient was then ambulated after assessment by physical therapy and social services. She was then discharged home the following morning under the care of her daughter. Instructions were given for the patient to maintain adequate hydration and to continue taking her dual antiplatelets. She was advised to avoid stooping, bending or lifting weights above 10 pounds. Questions were answered to their satisfaction. The patient was then discharged home under the care of her daughters. 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 FOLLOW UP:  The patient will be followed in the outpatient clinic 2 weeks following her discharge. Electronically Signed   By: [name] M.D.   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 EXAM: ENDOVASCULAR TREATMENT OF RIGHT INTERNAL CAROTID ARTERY POSTERIOR COMMUNICATING REGION ANEURYSM WITH PIPELINE FLOW DIVERTER DEVICE. MEDICATIONS: Ancef 2 g IV was administered within an appropriate time interval prior to skin puncture. ANESTHESIA/SEDATION: General anesthesia. FLUOROSCOPY TIME:  Fluoroscopy Time: 26 minutes 43 seconds (1448 mGy). COMPLICATIONS: None immediate.  PROCEDURE: Informed written consent was obtained from the patient after a thorough discussion of the procedural risks, benefits and alternatives. All questions were addressed. Maximal Sterile Barrier Technique was utilized including caps, mask, sterile gowns, sterile gloves, sterile drape, hand hygiene and skin antiseptic. A timeout was performed prior to the initiation of the procedure. A direct right common carotid artery approach was utilized. Access was obtained by vascular surgery with the placement of a 6 French Pinnacle sheath. The patient was then brought to the neuro angiography suite. The 6 French Pinnacle sheath was then prepped and cleaned with Betadine. Thereafter using biplane roadmap technique, and constant fluoroscopic guidance, over a 0.035 inch J-tip guidewire, it was positioned in the proximal 1/3 of the right internal carotid artery.  Over this, a 5 French 115 cm Navien guide catheter was advanced and positioned in the mid 1/3 of the right internal carotid artery. The guidewire was removed. Good aspiration obtained from the hub of the 5 [place] Navien guide catheter. A gentle control arteriogram demonstrated no evidence of spasms, dissections or of intraluminal filling defects. Intracranially no change was seen in the angio architecture. Using biplane roadmap technique and constant fluoroscopic guidance, in a coaxial manner and with constant heparinized saline infusion, a Phenom 150 cm microcatheter was advanced over a 0.014 inch Softip Synchro micro guidewire to petrous segment of the right internal carotid artery. Thereafter, with the micro guidewire leading with a J-tip configuration to avoid dissections or inducing spasm, the combination was navigated without difficulty into the dominant inferior division M1 M2 segment of the right middle cerebral artery followed by the microcatheter. The guidewire was removed. Good aspiration obtained from the Phenom microcatheter. A gentle contrast injection  demonstrated no evidence of spasms or intraluminal filling defects. This was then connected to continuous heparinized saline infusion. The Navien guide catheter was then advanced into the petrous portion of the right internal carotid artery. Measurements had been performed of the proximal and the distal landing zone of the new flow diverter device. Continuous opacification of the previously treated wide neck aneurysm was noted in the right PCOM region though with slow inflow. It was decided to use a pipeline flex 4.75 x 20 mm diverter device. In a coaxial manner and with constant heparinized saline infusion using biplane roadmap technique and constant fluoroscopic guidance, the device was advanced to the distal end of the microcatheter. The O ring on the delivery microcatheter was then loosened. The entire system was then straightened. The tip of the microcatheter was now in the distal M1 segment of the right middle cerebral artery. With slight forward gentle traction with the right hand on the delivery micro guidewire, with the left hand the delivery microcatheter was retrieved unsheathing the distal portion of the device. Once the device changed from a cigar configuration to complete opening, the combination was retrieved into the proximal right M1 segment and further unsheathing was undertaken. The combination was then retrieved until the distal portion of this device was just inside the previously positioned device. Thereafter further deployment of the device was undertaken using combination of the advancement of the micro guidewire followed by the advancement of the microcatheter with the end of the microcatheter being centralized in the lumen of the internal carotid artery. Fluffing technique was utilized to ensure apposition to the parent vessel. This was continued until the entire device had been deployed. Once deployed, a control arteriogram performed through the 5 [place] Navien guide catheter demonstrated  excellent apposition of the device into the previous device. There was now almost near complete stagnation of contrast in the aneurysm. Patency of the right posterior communicating artery was noted. The microcatheter was then advanced through the device into the right middle cerebral artery where the wire was captured. The combination was then retrieved and removed under constant observation. Control arteriograms were then performed at 15, 30 and 40 minutes post deployment of the device. These continued to demonstrate excellent flow without evidence of intraluminal filling defects within the device or intracranially. No occlusion was seen. Throughout the procedure, the patient's blood pressure and neurological status remained stable. The ACT was maintained in the region of approximately 200 seconds. A final control arteriogram performed through the Navien guide catheter in the right internal carotid artery demonstrated  continued stagnation of contrast within the aneurysm, with no intraluminal filling defects or occlusions within the device, or intracranially. Patency of the middle cerebral artery and the right anterior cerebral artery was maintained. The 5 [place] Navien guide catheter was then removed. The 6 French right common carotid sheath was then secured. The patient was then transferred to the OR to have the right carotid access site closed by vascular surgery. The patient was then transferred to the neuro ICU and left intubated for airway protection overnight under the care of Critical Care Medicine. The following morning, the patient's IV heparin was stopped. The patient was switched to aspirin and Plavix. Her sedation was then gradually weaned and the patient was extubated without difficulty. The patient maintained adequate oxygen saturations. Neurologically she remained stable with no new neurological signs or symptoms. The patient was then ambulated after assessment by physical therapy and social  services. She was then discharged home the following morning under the care of her daughter. Instructions were given for the patient to maintain adequate hydration and to continue taking her dual antiplatelets. She was advised to avoid stooping, bending or lifting weights above 10 pounds. Questions were answered to their satisfaction. The patient was then discharged home under the care of her daughters. 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 FOLLOW UP:  The patient will be followed in the outpatient clinic 2 weeks following her discharge. Electronically Signed   By: [name] M.D.   On: 08/27/2017 09:09    Labs:  CBC: Recent Labs    08/14/17 0916 08/22/17 1502 08/23/17 0519 08/24/17 0604  WBC 3.6* 3.5* 5.5 4.9  HGB 10.5* 8.6* 8.6* 7.9*  HCT 33.5* 26.3* 26.9* 25.9*  PLT 105* 95* 107* 116*    COAGS: Recent Labs    06/13/17 0936 08/14/17 0916 08/23/17 0519  INR 0.95 0.98 1.13  APTT 21* 22* 42*    BMP: Recent Labs    08/14/17 0916 08/22/17 1502 08/23/17 0519 08/24/17 0604  NA 138 138 140 140  K 4.2 3.0* 3.4* 3.5  CL 104 107 106 107  CO2 24 22 23 25   GLUCOSE 93 124* 149* 92  BUN 7* 6* 9 14  CALCIUM 8.7* 7.0* 7.8* 7.8*  CREATININE 0.80 0.60 0.83 0.85  GFRNONAA >60 >60 >60 >60  GFRAA >60 >60 >60 >60    LIVER FUNCTION TESTS: Recent Labs    06/13/17 0936 08/14/17 0916 08/22/17 1502 08/23/17 0519  BILITOT 1.1 0.9 1.2 0.6  AST 23 25 22 21   ALT 16 17 13 12   ALKPHOS 45 39 27* 27*  PROT 7.3 6.5 4.5* 5.2*  ALBUMIN 3.7 3.4* 2.5* 2.7*    TUMOR MARKERS: No results for input(s): AFPTM, CEA, CA199, CHROMGRNA in the last 8760 hours.  Assessment and Plan:  Status post image-guided cerebral angiogram with embolization of her right ICA PCOM aneurysm by Dr. [name]  on 08/22/2017.   I have instructed her to take her Plavix daily and take it as soon as she gets home.  If she needs refills we are happy to call this in for her.  Discussed with Dr. [name] = No p2y12 today.  Will have her return in 4 months with follow up angiography.  Electronically Signed: Murrell Redden, PA-C   09/20/2017, 10:44 AM      I spent a total  of  40 Minutes in face to face in clinical consultation, greater than 50% of which was counseling/coordinating care for f/u after PCOM aneurysm embo.

## 2017-10-08 ENCOUNTER — Other Ambulatory Visit: Payer: Self-pay | Admitting: Radiology

## 2017-10-08 ENCOUNTER — Telehealth: Payer: Self-pay | Admitting: Radiology

## 2017-10-08 NOTE — Progress Notes (Signed)
   Patient presented to [facility] 08/22/2017 for an image-guided cerebral angiogram with Dr. [name]. Before procedure occurred, patient was taken to OR with Dr. Oneida Alar for exposure of right common carotid artery placement of 6 French sheath for vascular access for Dr. Arlean Hopping procedure. Following Dr. Arlean Hopping procedure, patient was taken back to OR for closure with Dr. Oneida Alar.  Pt discharged 8/2  Plavix 75 mg daily and ASA 81 mg daily Pt states Plavix is causing major bruising of skin; body aches; soreness  Dr Sallyanne Havers has approved Plavix 37.5 mg daily and ASA 81 mg daily At this time  Must get P2y12 this week  Must take Plavix daily until 10/17/17- which is 8 weeks after most recent intervention She CAN come off Plavix for 5 days for dental procedures to be scheduled after 9/25 As long as she continues ASA 81 mg daily.  She has good understanding of recommendations.

## 2017-10-09 NOTE — Telephone Encounter (Signed)
No answer

## 2017-10-12 ENCOUNTER — Other Ambulatory Visit (HOSPITAL_COMMUNITY): Payer: Self-pay

## 2017-10-12 DIAGNOSIS — I729 Aneurysm of unspecified site: Secondary | ICD-10-CM

## 2017-10-12 LAB — PLATELET INHIBITION P2Y12: Platelet Function  P2Y12: 73 [PRU] — ABNORMAL LOW (ref 194–418)

## 2017-10-16 ENCOUNTER — Telehealth (HOSPITAL_COMMUNITY): Payer: Self-pay

## 2017-10-16 NOTE — Telephone Encounter (Signed)
Called pt to inform her that her P2Y12 results were normal per Dr. [name] and there are no changes to be made to her medication. Pt agreed. AW

## 2017-11-27 ENCOUNTER — Telehealth (HOSPITAL_COMMUNITY): Payer: Self-pay

## 2017-11-27 ENCOUNTER — Telehealth (HOSPITAL_COMMUNITY): Payer: Self-pay | Admitting: Radiology

## 2017-11-27 NOTE — Telephone Encounter (Signed)
Pt called to see about scheduling her f/u before she goes out of town in November. Informed her that she is not due for a f/u angiogram until December. She decided to wait until she returns and she will give me a call to schedule when she returns. AW

## 2017-11-27 NOTE — Telephone Encounter (Signed)
Pt called and wanted to know if she had to have a cerebral angiogram for her 4 month follow-up or if she could have a CTA head/neck? I spoke with Dr. [name] and he is agreeable to patient having CTA head/neck instead of the angiogram. JM

## 2017-11-28 ENCOUNTER — Other Ambulatory Visit (HOSPITAL_COMMUNITY): Payer: Self-pay | Admitting: Interventional Radiology

## 2017-11-28 DIAGNOSIS — I671 Cerebral aneurysm, nonruptured: Secondary | ICD-10-CM

## 2017-12-03 ENCOUNTER — Ambulatory Visit (HOSPITAL_COMMUNITY)
Admission: RE | Admit: 2017-12-03 | Discharge: 2017-12-03 | Disposition: A | Discharge status: Home / Self Care | Payer: Medicare Other | Source: Ambulatory Visit | Attending: Interventional Radiology | Admitting: Interventional Radiology

## 2017-12-03 ENCOUNTER — Ambulatory Visit (HOSPITAL_COMMUNITY): Payer: Medicare Other

## 2017-12-03 ENCOUNTER — Encounter (HOSPITAL_COMMUNITY): Payer: Self-pay

## 2017-12-03 ENCOUNTER — Other Ambulatory Visit (HOSPITAL_COMMUNITY): Payer: Self-pay | Admitting: Interventional Radiology

## 2017-12-03 DIAGNOSIS — I671 Cerebral aneurysm, nonruptured: Secondary | ICD-10-CM

## 2017-12-03 DIAGNOSIS — I6782 Cerebral ischemia: Secondary | ICD-10-CM | POA: Insufficient documentation

## 2017-12-03 HISTORY — PX: IR US GUIDE VASC ACCESS RIGHT: IMG2390

## 2017-12-03 HISTORY — PX: IR RADIOLOGY PERIPHERAL GUIDED IV START: IMG5598

## 2017-12-03 LAB — POCT I-STAT CREATININE: Creatinine, Ser: 0.8 mg/dL (ref 0.44–1.00)

## 2017-12-03 MED ORDER — IOPAMIDOL (ISOVUE-370) INJECTION 76%
50.0000 mL | Freq: Once | INTRAVENOUS | Status: DC | PRN
Start: 2017-12-03 — End: 2017-12-04

## 2017-12-03 MED ORDER — LIDOCAINE HCL 1 % IJ SOLN
INTRAMUSCULAR | Status: AC
  Filled 2017-12-03: qty 20

## 2017-12-03 MED ORDER — IOPAMIDOL (ISOVUE-370) INJECTION 76%
50.0000 mL | Freq: Once | INTRAVENOUS | Status: AC | PRN
  Administered 2017-12-03: 50 mL via INTRAVENOUS

## 2017-12-03 NOTE — Procedures (Signed)
Sccessful placement of (L)basilic vein micropuncture IV with US guidance. No complications. Ready for use.  Ascencion Dike PA-C Interventional Radiology 12/03/2017 11:42 AM

## 2017-12-04 ENCOUNTER — Other Ambulatory Visit (HOSPITAL_COMMUNITY): Payer: Self-pay | Admitting: Interventional Radiology

## 2017-12-04 ENCOUNTER — Encounter (HOSPITAL_COMMUNITY): Payer: Self-pay

## 2017-12-04 DIAGNOSIS — I671 Cerebral aneurysm, nonruptured: Secondary | ICD-10-CM

## 2017-12-11 ENCOUNTER — Telehealth (HOSPITAL_COMMUNITY): Payer: Self-pay

## 2017-12-11 NOTE — Telephone Encounter (Signed)
Called pt regarding recent cta, no answer, left vm. AW 

## 2017-12-12 ENCOUNTER — Telehealth (HOSPITAL_COMMUNITY): Payer: Self-pay

## 2017-12-12 NOTE — Telephone Encounter (Signed)
Called regarding recent cta, no answer, left vm. AW

## 2018-01-03 ENCOUNTER — Telehealth (HOSPITAL_COMMUNITY): Payer: Self-pay

## 2018-01-03 NOTE — Telephone Encounter (Signed)
Pt agreed to f/u in 6 months with CTA head/neck. AW

## 2018-01-07 ENCOUNTER — Telehealth: Payer: Self-pay | Admitting: Radiology

## 2018-01-07 NOTE — Progress Notes (Signed)
12/03/17 CTA:  IMPRESSION: 1. Residual filling of right ICA communicating segment aneurysm measures 8 x 7 x 9 mm. 2. Patent bilateral distal ICA/MCA flow diverting stents. 3. Patent intracranial arteries. 4. No occlusion or stenosis of the major arteries of the neck. 5. Chronic small vessel ischemia.  Pt may come off Plavix 75 mg daily NOW per Dr [name] Continue ASA 81 mg daily  Will r evaluate at follow up in few months  Pt is aware and grateful!!

## 2018-02-27 ENCOUNTER — Ambulatory Visit: Payer: Medicare Other | Admitting: Cardiology

## 2018-03-20 ENCOUNTER — Other Ambulatory Visit (HOSPITAL_COMMUNITY): Payer: Self-pay | Admitting: Interventional Radiology

## 2018-03-20 DIAGNOSIS — I729 Aneurysm of unspecified site: Secondary | ICD-10-CM

## 2018-03-21 ENCOUNTER — Other Ambulatory Visit: Payer: Self-pay | Admitting: Cardiology

## 2018-03-21 DIAGNOSIS — I119 Hypertensive heart disease without heart failure: Secondary | ICD-10-CM

## 2018-03-21 DIAGNOSIS — I2510 Atherosclerotic heart disease of native coronary artery without angina pectoris: Secondary | ICD-10-CM

## 2018-03-21 DIAGNOSIS — E782 Mixed hyperlipidemia: Secondary | ICD-10-CM

## 2018-03-21 NOTE — Telephone Encounter (Signed)
Please refill meds as is.  Her meds should be dispensed as written, due to generic brand of metoprolol succinate caused the pt to be sick to her stomach and nauseated, so that's why she was switched to brand name Toprol XL with instructions to DAW.  Please refill accordingly and as is.

## 2018-04-19 ENCOUNTER — Ambulatory Visit: Payer: Medicare Other | Admitting: Cardiology

## 2018-05-23 ENCOUNTER — Telehealth: Payer: Self-pay

## 2018-05-23 NOTE — Telephone Encounter (Signed)
   TELEPHONE CALL NOTE  This patient has been deemed a candidate for follow-up tele-health visit to limit community exposure during the Covid-19 pandemic. I spoke with the patient via phone to discuss instructions. This has been outlined on the patient's AVS (dotphrase: [email or web address]). The patient was advised to review the section on consent for treatment as well. The patient will receive a phone call 2-3 days prior to their E-Visit at which time consent will be verbally confirmed.   A Virtual Office Visit appointment type has been scheduled for 5/7  with Lyda Jester, PA, with "VIDEO" or "TELEPHONE" in the appointment notes - patient prefers TELEPHONE type.  Frederik Schmidt, RN 05/23/2018 3:51 PM   Patient has verbally consented to Virtual Visit by Phone with Lyda Jester, PA

## 2018-05-27 ENCOUNTER — Ambulatory Visit (HOSPITAL_COMMUNITY): Payer: Medicare Other

## 2018-05-28 ENCOUNTER — Telehealth: Payer: Self-pay

## 2018-05-28 NOTE — Telephone Encounter (Signed)
**Note De-Identified Jeanne Lawson Obfuscation** I have done a Toprol XL PA through covermymeds. Key: [id number]

## 2018-05-29 NOTE — Telephone Encounter (Signed)
Letter received from [employer] stating that they denied coverage of the pts Toprol XL as it is excluded from the pts plan.   I then called [employer] (the pts insurance has covered name brand Toprol XL since 2015) and s/w Roselie Awkward who advised me that the pt does not have Part D coverage with them and that she only has part D.  I then called the pts pharmacy )[facility]) to get info off of the card they run this RX under. I found that we do not have the needed info from the card we have on file for the pt. Correct info is: [employer] ID: [id number] BIN: [id number] PCN: [employer]IME GRP: [id number]  I have done another name brand Toprol XL through covermymeds. (Key: [id number]

## 2018-05-29 NOTE — Telephone Encounter (Signed)
Message just received from covermymeds:  Jeanne Lawson Key: [id number] - PA Case ID: [id number]  Outcome  Approved today  Case [id number]; Status:Approved;Review Type:Prior Auth;Coverage Start Date: 04/29/2018 Coverage End Date: 05/29/2019  I have notified the pts pharmacy of this approval.

## 2018-05-30 ENCOUNTER — Other Ambulatory Visit: Payer: Self-pay

## 2018-05-30 ENCOUNTER — Encounter: Payer: Self-pay | Admitting: Cardiology

## 2018-05-30 ENCOUNTER — Telehealth (INDEPENDENT_AMBULATORY_CARE_PROVIDER_SITE_OTHER): Payer: Medicare Other | Admitting: Cardiology

## 2018-05-30 VITALS — Ht 61.0 in | Wt 158.0 lb

## 2018-05-30 DIAGNOSIS — I2510 Atherosclerotic heart disease of native coronary artery without angina pectoris: Secondary | ICD-10-CM

## 2018-05-30 NOTE — Patient Instructions (Signed)
Medication Instructions:  none If you need a refill on your cardiac medications before your next appointment, please call your pharmacy.   Lab work: none If you have labs (blood work) drawn today and your tests are completely normal, you will receive your results only by: Marland Kitchen MyChart Message (if you have MyChart) OR . A paper copy in the mail If you have any lab test that is abnormal or we need to change your treatment, we will call you to review the results.  Testing/Procedures: none  Follow-Up: 1 Year with Dr Meda Coffee At [facility], you and your health needs are our priority.  As part of our continuing mission to provide you with exceptional heart care, we have created designated Provider Care Teams.  These Care Teams include your primary Cardiologist (physician) and Advanced Practice Providers (APPs -  Physician Assistants and Nurse Practitioners) who all work together to provide you with the care you need, when you need it. .   Any Other Special Instructions Will Be Listed Below (If Applicable).

## 2018-05-30 NOTE — Progress Notes (Signed)
Virtual Visit via Telephone Note   This visit type was conducted due to national recommendations for restrictions regarding the COVID-19 Pandemic (e.g. social distancing) in an effort to limit this patient's exposure and mitigate transmission in our community.  Due to her co-morbid illnesses, this patient is at least at moderate risk for complications without adequate follow up.  This format is felt to be most appropriate for this patient at this time.  The patient did not have access to video technology/had technical difficulties with video requiring transitioning to audio format only (telephone).  All issues noted in this document were discussed and addressed.  No physical exam could be performed with this format.  Please refer to the patient's chart for her  consent to telehealth for [facility].   Date:  05/30/2018   ID:  Jeanne Lawson, DOB Jul 15, 1945, MRN [id number]  Patient Location: Home Provider Location: Home  PCP:  [name], MD  Cardiologist:  Ena Dawley, MD  Electrophysiologist:  None   Evaluation Performed:  Follow-Up Visit  Chief Complaint:  F/u for CAD   History of Present Illness:    Jeanne Lawson is a 73 y.o. female previously followed by Dr. Mare Ferrari. She has a past history of multiple strokes. She's had problems with high blood pressure in the past. She has a history of prior coronary artery disease and 18 years ago Dr. Ilda Foil did cardiac catheterization and angioplasty but no stent. We don't have those records. The patient has had several subsequent Cardiolite stress tests which have been normal but have been poorly tolerated by the patient and she does not want any further nuclear stress tests. She does have a chronically abnormal EKG with anterolateral T wave inversions. She denies any symptoms of angina or dyspnea since her last OV. Unfortunately, she has had recurrent strokes. 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 [name]. This is followed by Dr. [name] and Dr. Oneida Alar. 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 She has recovered well from this. She is no longer on Plavix but takes ASA daily.   Pt reports that they have done well from a symptom standpoint since their last office visit. They deny chest pain and dyspnea. No exertional symptoms w/ exercise or ADLs. Pt also denies orthopnea, PND, LEE, palpitations, dizziness, syncope/ near syncope and claudication.   They report full medication compliance. No intolerances or medication side effects. Pt also denies any hospitalizations,  ED/urgent care visits or surgeries since their last office visit.    The patient does not have symptoms concerning for COVID-19 infection (fever, chills, cough, or new shortness of breath).    Past Medical History:  Diagnosis Date  . Cancer (HCC)    right leg skin   . COPD (chronic obstructive pulmonary disease) ([place])   . Coronary artery disease 1996   Known with prior mild lesion of LAD demonstrated by Cardiac Catheterization in 1996  . Edema of foot    She has a history of chronic edema of the left dated back to age 55 when she suufered severe frostbite playing  on the snow as a child  . Hypertension   . PAC (premature atrial contraction)   . Pancreatitis  x2  . PONV (postoperative nausea and vomiting)    problems waking up last time 4/16 only time  . 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  Marland Kitchen Shortness of breath dyspnea    since stroke 2 months ago -  . Stroke [facility]) 04/2014   She had had a previous thrombotic stroke  involving the right corona radiata in  October 2010; left arm and leg weakness  . TIA (transient ischemic attack)    She was hospitalized 04-23-09 through 04-27-09 for involving right side of the body  . Tobacco abuse    Ongoing   . Ventricular hypertrophy 04/2009   LVH with diastolic dysfunction by echo. Has normal EF.   Past Surgical History:  Procedure Laterality Date  . ABDOMINAL HYSTERECTOMY    . APPENDECTOMY    . CARDIAC CATHETERIZATION  1996   Mild CAD with vasospasm  . CARDIOVASCULAR STRESS TEST  12-02-2001   EF 70%  . CHOLECYSTECTOMY N/A 03/09/2016   Procedure: LAPAROSCOPIC CHOLECYSTECTOMY WITH INTRAOPERATIVE CHOLANGIOGRAM;  Surgeon: Georganna Skeans, MD;  Location: [place];  Service: General;  Laterality: N/A;  . ENDARTERECTOMY Right 08/12/2014   Procedure: RIGHT  COMMON CAROTID ARTERY EXPOSURE FOR INTERVENTIONAL RADIOLOGY PROCEDURE BY DR.DEVASHWAR,Insertion 6 FR sheath;  Surgeon: Elam Dutch, MD;  Location: [place];  Service: Vascular;  Laterality: Right;  . ENDARTERECTOMY Right 08/12/2014   Procedure:  CAROTID  EXPOSURE CLOSURE RIGHT NECK, REPAIR RIGHT COMMON CAROTID ARTERY;  Surgeon: Elam Dutch, MD;  Location: [place];  Service: Vascular;  Laterality: Right;  . ENDARTERECTOMY Left 11/18/2014   Procedure: CAROTID EXPOSURE;  Surgeon: Elam Dutch, MD;  Location: [place];  Service: Vascular;  Laterality: Left;  . ENDARTERECTOMY Left 11/18/2014   Procedure: CLOSURE CAROTID;  Surgeon: Elam Dutch, MD;  Location: [place];  Service: Vascular;  Laterality: Left;  . ENDARTERECTOMY Right 08/22/2017   Procedure: EXPOSURE CAROTID ARTERY FOR [place] [facility];  Surgeon: Elam Dutch, MD;  Location: [facility] OR;  Service: Vascular;  Laterality: Right;  . IR ANGIO INTRA EXTRACRAN SEL INTERNAL CAROTID UNI R MOD SED  08/22/2017  . IR ANGIOGRAM FOLLOW UP STUDY  08/22/2017  . IR RADIOLOGIST EVAL & MGMT  06/26/2016  . IR RADIOLOGIST EVAL & MGMT  05/08/2017  . IR RADIOLOGY PERIPHERAL GUIDED IV START  12/03/2017  . IR TRANSCATH/EMBOLIZ   08/22/2017  . IR US GUIDE VASC ACCESS RIGHT  12/03/2017  . LAPAROSCOPY    . NECK SURGERY  50 yrs ago   Left side tumor  . RADIOLOGY WITH ANESTHESIA N/A 05/25/2014   Procedure: RADIOLOGY WITH ANESTHESIA;  Surgeon: [name], MD;  Location: [place];  Service: Radiology;  Laterality: N/A;  . RADIOLOGY WITH ANESTHESIA N/A 08/12/2014   Procedure: RADIOLOGY WITH ANESTHESIA;  Surgeon: [name], MD;  Location: [place];  Service: Radiology;  Laterality: N/A;  . RADIOLOGY WITH ANESTHESIA N/A 03/15/2015   Procedure: MRI OF BRAIN WITHOUT CONTRAST;  Surgeon: Medication Radiologist, MD;  Location: [place];  Service: Radiology;  Laterality: N/A;  DR. TAT/MRI  . US ECHOCARDIOGRAPHY  04-26-2009   EF 65-70%  . VESICOVAGINAL FISTULA CLOSURE W/ TAH  25 yrs ago  . WOUND EXPLORATION Right 08/22/2017   Procedure: REMOVAL OF RIGHT COMMON CAROTID SHEATH AND WOUND CLOSURE;  Surgeon: Rosetta Posner, MD;  Location: [place];  Service: Vascular;  Laterality: Right;  Current Meds  Medication Sig  . albuterol (PROVENTIL HFA;VENTOLIN HFA) 108 (90 BASE) MCG/ACT inhaler Inhale 1-2 puffs into the lungs every 6 (six) hours as needed for wheezing or shortness of breath.  Marland Kitchen aspirin EC 81 MG tablet Take 81 mg by mouth daily with lunch.   . lisinopril-hydrochlorothiazide (PRINZIDE,ZESTORETIC) 20-12.5 MG tablet TAKE 1 TABLET BY MOUTH DAILY.  . TOPROL XL 50 MG 24 hr tablet TAKE 1/2 TABLET BY MOUTH TWICE DAILY. TAKE WITH OR IMMEDIATELY FOLLOWING A MEAL.     Allergies:   Dilaudid [hydromorphone]; Latex; Lidocaine; Sulfa drugs cross reactors; and Codeine   Social History   Tobacco Use  . Smoking status: Former Smoker    Packs/day: 1.00    Years: 30.00    Pack years: 30.00    Types: Cigarettes    Last attempt to quit: 04/26/2014    Years since quitting: 4.0  . Smokeless tobacco: Never Used  Substance Use Topics  . Alcohol use: No  . Drug use: No     Family Hx: The patient's family history includes Aneurysm in her  sister; Diabetes in her father; Emphysema in her father; Heart disease in her mother; Hypertension in her daughter and mother; Stroke in her mother; Thyroid disease in her daughter.  ROS:   Please see the history of present illness.     All other systems reviewed and are negative.   Prior CV studies:   The following studies were reviewed today:  2D Echo 02/2015  - Left ventricle: The cavity size was normal. Wall thickness was increased in a pattern of moderate LVH. Systolic function was normal. The estimated ejection fraction was in the range of 60% to 65%. Wall motion was normal; there were no regional wall motion abnormalities. Doppler parameters are consistent with abnormal left ventricular relaxation (grade 1 diastolic dysfunction).  Labs/Other Tests and Data Reviewed:    EKG:  An ECG dated 02/26/2017 was personally reviewed today and demonstrated:  sinus bradycardia 50 bpm  Recent Labs: 08/23/2017: ALT 12 08/24/2017: BUN 14; Hemoglobin 7.9; Magnesium 2.2; Platelets 116; Potassium 3.5; Sodium 140 12/03/2017: Creatinine, Ser 0.80   Recent Lipid Panel Lab Results  Component Value Date/Time   CHOL 147 11/27/2016 10:19 AM   TRIG 92 08/22/2017 08:56 PM   HDL 65 11/27/2016 10:19 AM   CHOLHDL 2.3 11/27/2016 10:19 AM   CHOLHDL 2.1 03/08/2016 04:47 AM   LDLCALC 56 11/27/2016 10:19 AM    Wt Readings from Last 3 Encounters:  05/30/18 158 lb (71.7 kg)  09/06/17 157 lb (71.2 kg)  08/22/17 168 lb 14 oz (76.6 kg)     Objective:    Vital Signs:  Ht 5\' 1"  (1.549 m)   Wt 158 lb (71.7 kg)   BMI 29.85 kg/m    Physical Exam: well sounding female, speaking in clear complete sentences. Speech unlabored. Breathing unlabored.   ASSESSMENT & PLAN:    1. CAD: She has a history of prior coronary artery disease and 18 years ago Dr. Ilda Foil did cardiac catheterization and angioplasty but no stent. We don't have those records. The patient has had several subsequent Cardiolite  stress tests which have been normal. She denies any anginal symptoms. No CP or dyspnea. No exertional symptoms. Remains on ASA and Metoprolol.   2. HTN: on metoprolol and lisinopril-hctz. Unfortunately, she does not have a BP monitor at home. She denies any symptoms. She states her BP is usually controlled with regimen when she goes to doctors appts.   3.  H/o Strokes: followed by neuro. No longer on Plavix due to h/o bleeding and gastric irrigation. Doing ok on ASA. No stroke like symptoms.   4. 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 Doing well.    COVID-19 Education: The signs and symptoms of COVID-19 were discussed with the patient and how to seek care for testing (follow up with PCP or arrange E-visit).  The importance of social distancing was discussed today.  Time:   Today, I have spent 20 minutes with the patient with telehealth technology discussing the above problems.     Medication Adjustments/Labs and Tests Ordered: Current medicines are reviewed at length with the patient today.  Concerns regarding medicines are outlined above.   Tests Ordered: No orders of the defined types were placed in this encounter.   Medication Changes: No orders of the defined types were placed in this encounter.   Disposition:  Follow up with Dr. Meda Coffee in 1 year.   Signed, Lyda Jester, PA-C  05/30/2018 9:08 AM    [facility]

## 2018-08-26 ENCOUNTER — Other Ambulatory Visit (HOSPITAL_COMMUNITY): Payer: Self-pay

## 2018-08-26 ENCOUNTER — Ambulatory Visit (HOSPITAL_COMMUNITY)
Admission: RE | Admit: 2018-08-26 | Discharge: 2018-08-26 | Disposition: A | Discharge status: Home / Self Care | Payer: Medicare Other | Source: Ambulatory Visit | Attending: Interventional Radiology | Admitting: Interventional Radiology

## 2018-08-26 ENCOUNTER — Other Ambulatory Visit: Payer: Self-pay

## 2018-08-26 DIAGNOSIS — I729 Aneurysm of unspecified site: Secondary | ICD-10-CM | POA: Diagnosis present

## 2018-08-26 LAB — POCT I-STAT CREATININE: Creatinine, Ser: 0.8 mg/dL (ref 0.44–1.00)

## 2018-08-26 LAB — PLATELET INHIBITION P2Y12: Platelet Function  P2Y12: 333 [PRU] (ref 182–335)

## 2018-08-26 MED ORDER — IOHEXOL 350 MG/ML SOLN
100.0000 mL | Freq: Once | INTRAVENOUS | Status: AC | PRN
  Administered 2018-08-26: 100 mL via INTRAVENOUS

## 2018-08-28 ENCOUNTER — Telehealth (HOSPITAL_COMMUNITY): Payer: Self-pay

## 2018-08-28 NOTE — Telephone Encounter (Signed)
Called pt to schedule consult to discuss CTA. Pt is having surgery on Monday to get teeth removed and will be out for about 5 weeks. She would like to wait to schedule. AW

## 2018-09-16 ENCOUNTER — Other Ambulatory Visit: Payer: Self-pay | Admitting: Cardiology

## 2018-09-16 DIAGNOSIS — I119 Hypertensive heart disease without heart failure: Secondary | ICD-10-CM

## 2018-09-16 DIAGNOSIS — I2510 Atherosclerotic heart disease of native coronary artery without angina pectoris: Secondary | ICD-10-CM

## 2018-09-16 DIAGNOSIS — E782 Mixed hyperlipidemia: Secondary | ICD-10-CM

## 2018-09-16 MED ORDER — TOPROL XL 50 MG PO TB24: ORAL_TABLET | ORAL | 2 refills | Status: DC

## 2018-10-07 ENCOUNTER — Other Ambulatory Visit: Payer: Self-pay | Admitting: Cardiology

## 2018-10-07 DIAGNOSIS — E782 Mixed hyperlipidemia: Secondary | ICD-10-CM

## 2018-10-07 DIAGNOSIS — I2510 Atherosclerotic heart disease of native coronary artery without angina pectoris: Secondary | ICD-10-CM

## 2018-10-07 DIAGNOSIS — I119 Hypertensive heart disease without heart failure: Secondary | ICD-10-CM

## 2018-11-26 ENCOUNTER — Telehealth (HOSPITAL_COMMUNITY): Payer: Self-pay

## 2018-11-26 NOTE — Telephone Encounter (Signed)
Called to schedule consult. Pt does not want to come in at this time due to COVID and numbers rising. She will call when she is ready. I will let our PA know. AW

## 2018-12-03 ENCOUNTER — Other Ambulatory Visit: Payer: Self-pay | Admitting: Cardiology

## 2018-12-03 DIAGNOSIS — E782 Mixed hyperlipidemia: Secondary | ICD-10-CM

## 2018-12-03 DIAGNOSIS — I2510 Atherosclerotic heart disease of native coronary artery without angina pectoris: Secondary | ICD-10-CM

## 2018-12-03 DIAGNOSIS — I119 Hypertensive heart disease without heart failure: Secondary | ICD-10-CM

## 2018-12-04 ENCOUNTER — Other Ambulatory Visit: Payer: Self-pay | Admitting: Cardiology

## 2018-12-04 DIAGNOSIS — E782 Mixed hyperlipidemia: Secondary | ICD-10-CM

## 2018-12-04 DIAGNOSIS — I2510 Atherosclerotic heart disease of native coronary artery without angina pectoris: Secondary | ICD-10-CM

## 2018-12-04 DIAGNOSIS — I119 Hypertensive heart disease without heart failure: Secondary | ICD-10-CM

## 2018-12-04 MED ORDER — TOPROL XL 50 MG PO TB24: ORAL_TABLET | ORAL | 1 refills | Status: DC

## 2019-01-28 ENCOUNTER — Telehealth (HOSPITAL_COMMUNITY): Payer: Self-pay

## 2019-01-28 NOTE — Telephone Encounter (Signed)
Returned pt's call, no answer, left vm. AW  

## 2019-02-18 ENCOUNTER — Other Ambulatory Visit: Payer: Self-pay | Admitting: Cardiology

## 2019-02-18 ENCOUNTER — Ambulatory Visit: Payer: Medicare Other

## 2019-02-18 NOTE — Telephone Encounter (Signed)
*  STAT* If patient is at the pharmacy, call can be transferred to refill team.   1. Which medications need to be refilled? (please list name of each medication and dose if known) Dulera  2. Which pharmacy/location (including street and city if local pharmacy) is medication to be sent to? [place], [place]  3. Do they need a 30 day or 90 day supply? 30 day

## 2019-02-18 NOTE — Telephone Encounter (Signed)
I called pt and advised her to contact her PCP for this medication.

## 2019-03-01 ENCOUNTER — Ambulatory Visit: Payer: Medicare Other | Attending: Internal Medicine

## 2019-03-01 DIAGNOSIS — Z23 Encounter for immunization: Secondary | ICD-10-CM

## 2019-03-01 NOTE — Progress Notes (Signed)
   Covid-19 Vaccination Clinic  Name:  Jeanne Lawson    MRN: [id number] DOB: 05-Apr-1945  03/01/2019  [name] was observed post Covid-19 immunization for 30 minutes based on pre-vaccination screening without incidence. She was provided with Vaccine Information Sheet and instruction to access the V-Safe system.   [name] was instructed to call 911 with any severe reactions post vaccine: Marland Kitchen Difficulty breathing  . Swelling of your face and throat  . A fast heartbeat  . A bad rash all over your body  . Dizziness and weakness    Immunizations Administered    Name Date Dose VIS Date Route   Pfizer COVID-19 Vaccine 03/01/2019  8:39 AM 0.3 mL 01/03/2019 Intramuscular   Manufacturer: [place]   Lot: [id number]   [place]: [id number]

## 2019-03-03 ENCOUNTER — Telehealth (HOSPITAL_COMMUNITY): Payer: Self-pay

## 2019-03-03 NOTE — Telephone Encounter (Signed)
Called to schedule f/u cta head/neck. Pt does not want to schedule until after she gets her 2nd dose of the vaccine. She is scheduled to get that on 03/25/19. She will schedule the middle of March. AW

## 2019-03-25 ENCOUNTER — Ambulatory Visit: Payer: Medicare Other | Attending: Internal Medicine

## 2019-03-25 DIAGNOSIS — Z23 Encounter for immunization: Secondary | ICD-10-CM | POA: Insufficient documentation

## 2019-03-25 NOTE — Progress Notes (Signed)
   Covid-19 Vaccination Clinic  Name:  Jeanne Lawson    MRN: [id number] DOB: Mar 30, 1945  03/25/2019  [name] was observed post Covid-19 immunization for 15 minutes without incident. She was provided with Vaccine Information Sheet and instruction to access the V-Safe system.   [name] was instructed to call 911 with any severe reactions post vaccine: Marland Kitchen Difficulty breathing  . Swelling of face and throat  . A fast heartbeat  . A bad rash all over body  . Dizziness and weakness   Immunizations Administered    Name Date Dose VIS Date Route   Pfizer COVID-19 Vaccine 03/25/2019  8:58 AM 0.3 mL 01/03/2019 Intramuscular   Manufacturer: [place]   Lot: [id number]   [employer]: [id number]

## 2019-04-14 ENCOUNTER — Other Ambulatory Visit (HOSPITAL_COMMUNITY): Payer: Self-pay | Admitting: Interventional Radiology

## 2019-04-14 DIAGNOSIS — I671 Cerebral aneurysm, nonruptured: Secondary | ICD-10-CM

## 2019-04-28 ENCOUNTER — Ambulatory Visit (HOSPITAL_COMMUNITY): Payer: Medicare Other

## 2019-05-19 ENCOUNTER — Ambulatory Visit (HOSPITAL_COMMUNITY): Payer: Medicare Other

## 2019-05-19 ENCOUNTER — Encounter (HOSPITAL_COMMUNITY): Payer: Self-pay

## 2019-05-19 ENCOUNTER — Ambulatory Visit (HOSPITAL_COMMUNITY)
Admission: RE | Admit: 2019-05-19 | Discharge: 2019-05-19 | Disposition: A | Discharge status: Home / Self Care | Payer: Medicare Other | Source: Ambulatory Visit | Attending: Interventional Radiology | Admitting: Interventional Radiology

## 2019-05-19 DIAGNOSIS — I671 Cerebral aneurysm, nonruptured: Secondary | ICD-10-CM | POA: Insufficient documentation

## 2019-05-19 LAB — POCT I-STAT CREATININE: Creatinine, Ser: 0.7 mg/dL (ref 0.44–1.00)

## 2019-05-19 MED ORDER — IOHEXOL 350 MG/ML SOLN
75.0000 mL | Freq: Once | INTRAVENOUS | Status: AC | PRN
  Administered 2019-05-19: 75 mL via INTRAVENOUS

## 2019-05-20 ENCOUNTER — Other Ambulatory Visit (HOSPITAL_COMMUNITY): Payer: Self-pay | Admitting: Interventional Radiology

## 2019-05-20 DIAGNOSIS — I671 Cerebral aneurysm, nonruptured: Secondary | ICD-10-CM

## 2019-05-24 ENCOUNTER — Other Ambulatory Visit: Payer: Self-pay

## 2019-05-24 ENCOUNTER — Emergency Department (HOSPITAL_COMMUNITY): Payer: Medicare Other

## 2019-05-24 ENCOUNTER — Encounter (HOSPITAL_COMMUNITY): Payer: Self-pay

## 2019-05-24 ENCOUNTER — Emergency Department (HOSPITAL_COMMUNITY)
Admission: EM | Admit: 2019-05-24 | Discharge: 2019-05-24 | Disposition: A | Discharge status: Home / Self Care | Payer: Medicare Other | Attending: Emergency Medicine | Admitting: Emergency Medicine

## 2019-05-24 DIAGNOSIS — Z87891 Personal history of nicotine dependence: Secondary | ICD-10-CM | POA: Insufficient documentation

## 2019-05-24 DIAGNOSIS — I2510 Atherosclerotic heart disease of native coronary artery without angina pectoris: Secondary | ICD-10-CM | POA: Diagnosis not present

## 2019-05-24 DIAGNOSIS — Z79899 Other long term (current) drug therapy: Secondary | ICD-10-CM | POA: Insufficient documentation

## 2019-05-24 DIAGNOSIS — J449 Chronic obstructive pulmonary disease, unspecified: Secondary | ICD-10-CM | POA: Diagnosis not present

## 2019-05-24 DIAGNOSIS — I10 Essential (primary) hypertension: Secondary | ICD-10-CM | POA: Insufficient documentation

## 2019-05-24 DIAGNOSIS — Z7982 Long term (current) use of aspirin: Secondary | ICD-10-CM | POA: Diagnosis not present

## 2019-05-24 DIAGNOSIS — Y999 Unspecified external cause status: Secondary | ICD-10-CM | POA: Insufficient documentation

## 2019-05-24 DIAGNOSIS — S4991XA Unspecified injury of right shoulder and upper arm, initial encounter: Secondary | ICD-10-CM | POA: Diagnosis present

## 2019-05-24 DIAGNOSIS — Y929 Unspecified place or not applicable: Secondary | ICD-10-CM | POA: Diagnosis not present

## 2019-05-24 DIAGNOSIS — W19XXXA Unspecified fall, initial encounter: Secondary | ICD-10-CM | POA: Insufficient documentation

## 2019-05-24 DIAGNOSIS — Z8673 Personal history of transient ischemic attack (TIA), and cerebral infarction without residual deficits: Secondary | ICD-10-CM | POA: Diagnosis not present

## 2019-05-24 DIAGNOSIS — Y939 Activity, unspecified: Secondary | ICD-10-CM | POA: Diagnosis not present

## 2019-05-24 DIAGNOSIS — S42201A Unspecified fracture of upper end of right humerus, initial encounter for closed fracture: Secondary | ICD-10-CM | POA: Diagnosis not present

## 2019-05-24 DIAGNOSIS — Z91040 Latex allergy status: Secondary | ICD-10-CM | POA: Insufficient documentation

## 2019-05-24 MED ORDER — ONDANSETRON HCL 4 MG/2ML IJ SOLN
4.0000 mg | Freq: Once | INTRAMUSCULAR | Status: AC
  Administered 2019-05-24: 4 mg via INTRAVENOUS
  Filled 2019-05-24: qty 2

## 2019-05-24 MED ORDER — HYDROCODONE-ACETAMINOPHEN 5-325 MG PO TABS: 1.0000 | ORAL_TABLET | ORAL | 0 refills | Status: AC | PRN

## 2019-05-24 MED ORDER — ONDANSETRON HCL 4 MG PO TABS: 4.0000 mg | ORAL_TABLET | Freq: Four times a day (QID) | ORAL | 0 refills | Status: AC

## 2019-05-24 MED ORDER — SODIUM CHLORIDE 0.9 % IV BOLUS
500.0000 mL | Freq: Once | INTRAVENOUS | Status: AC
  Administered 2019-05-24: 14:00:00 500 mL via INTRAVENOUS

## 2019-05-24 MED ORDER — ONDANSETRON HCL 4 MG/2ML IJ SOLN
4.0000 mg | Freq: Once | INTRAMUSCULAR | Status: AC
  Administered 2019-05-24: 13:00:00 4 mg via INTRAVENOUS
  Filled 2019-05-24: qty 2

## 2019-05-24 MED ORDER — HYDROCODONE-ACETAMINOPHEN 5-325 MG PO TABS
1.0000 | ORAL_TABLET | Freq: Once | ORAL | Status: AC
  Administered 2019-05-24: 1 via ORAL
  Filled 2019-05-24: qty 1

## 2019-05-24 MED ORDER — HYDROCODONE-ACETAMINOPHEN 5-325 MG PO TABS: 1.0000 | ORAL_TABLET | ORAL | 0 refills | Status: DC | PRN

## 2019-05-24 MED ORDER — KETOROLAC TROMETHAMINE 15 MG/ML IJ SOLN
15.0000 mg | Freq: Once | INTRAMUSCULAR | Status: AC
  Administered 2019-05-24: 13:00:00 15 mg via INTRAVENOUS
  Filled 2019-05-24: qty 1

## 2019-05-24 NOTE — Discharge Instructions (Signed)
Your xray showed a fracture of the right proximal humerus.   The number to Dr. Mardelle Matte, orthopedist is attached to your chart, please schedule an appointment.  I have prescribed Norco, please take this for severe pain as prescribed. This medication can make you drowsy, avoid driving.   The second medication is Zofran, please take this as needed to help with nausea.

## 2019-05-24 NOTE — ED Provider Notes (Signed)
[facility] EMERGENCY DEPARTMENT Provider Note   CSN: [id number] Arrival date & time: 05/24/19  1247     History Chief Complaint  Patient presents with  . Fall    Jeanne Lawson is a 74 y.o. female.  74 y.o female with an extesnive PMH of COPD, HTN, Stroke not on blood thinners presents to the ED via EMS s/p mechanical fall this morning. Patient was with family member having coffee when she began sweeping her porch, tripped and fell. She reports most of the impact was caught by the right side of her body. She was able to stand up with assistance by EMS and ambulated onto stretcher. She repots pain along the right shoulder, worst with movement. Given fentanyl 50 mcg by EMS patient became dizzy, pale and diaphoretic along with nauseated and bradycardia down to the 60s to 45 bpm.  Patient reports a low tolerance for pain medication.  She is currently on any blood thinners, the days hitting her head, denies any pain in any other part of her body.  No prior surgical intervention to her shoulders.  No headache, vomiting, other injuries.       The history is provided by the patient.  Fall Pertinent negatives include no chest pain, no abdominal pain, no headaches and no shortness of breath.       Past Medical History:  Diagnosis Date  . Cancer (HCC)    right leg skin   . COPD (chronic obstructive pulmonary disease) ([place])   . Coronary artery disease 1996   Known with prior mild lesion of LAD demonstrated by Cardiac Catheterization in 1996  . Edema of foot    She has a history of chronic edema of the left dated back to age 51 when she suufered severe frostbite playing  on the snow as a child  . Hypertension   . PAC (premature atrial contraction)   . Pancreatitis    x2  . PONV (postoperative nausea and vomiting)    problems waking up last time 4/16 only time  . Saccular aneurysm    She also has 2 known which were stable between the MRA of October2010 and the MRA   of April 2011.  Marland Kitchen Shortness of breath dyspnea    since stroke 2 months ago -  . 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 TIA (transient ischemic attack)    She was hospitalized 04-23-09 through 04-27-09 for involving right side of the body  . Tobacco abuse    Ongoing   . Ventricular hypertrophy 04/2009   LVH with diastolic dysfunction by echo. Has normal EF.    Patient Active Problem List   Diagnosis Date Noted  . Acute cholecystitis 03/07/2016  . 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 Aneurysm, cerebral, nonruptured 05/31/2015  . Dizziness and giddiness 05/31/2015  . Altered mental status   . Acute CVA (cerebrovascular accident) ([place]) 03/15/2015  . Dysphasia 03/12/2015  . Thrombocytopenia ([place]) 03/12/2015  . Hypokalemia   . Endotracheally intubated   . Essential hypertension   . Intracranial aneurysm 11/18/2014  . Respiratory failure ([place])   . Cerebral aneurysm   . Brain aneurysm   . Aphasia   . Palmar erythema   . CVA (cerebral infarction) 05/02/2014  . Aneurysm ([place]) 05/02/2014  . Expressive aphasia 05/02/2014  . 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 Tobacco abuse 10/24/2010  . TIA (transient ischemic attack)   . Hypertension   . Ventricular hypertrophy   . CVA 02/23/2010  . STRESS FRACTURE, FOOT 02/22/2010    Past Surgical History:  Procedure Laterality Date  . ABDOMINAL HYSTERECTOMY    . APPENDECTOMY    . CARDIAC CATHETERIZATION  1996   Mild CAD with vasospasm  . CARDIOVASCULAR STRESS TEST  12-02-2001   EF 70%  . CHOLECYSTECTOMY N/A 03/09/2016   Procedure: LAPAROSCOPIC CHOLECYSTECTOMY WITH INTRAOPERATIVE CHOLANGIOGRAM;  Surgeon: Georganna Skeans, MD;  Location: [facility];  Service: General;  Laterality: N/A;  . ENDARTERECTOMY Right 08/12/2014   Procedure: RIGHT  COMMON CAROTID ARTERY EXPOSURE FOR INTERVENTIONAL  RADIOLOGY PROCEDURE BY DR.DEVASHWAR,Insertion 6 FR sheath;  Surgeon: Elam Dutch, MD;  Location: [facility];  Service: Vascular;  Laterality: Right;  . ENDARTERECTOMY Right 08/12/2014   Procedure:  CAROTID  EXPOSURE CLOSURE RIGHT NECK, REPAIR RIGHT COMMON CAROTID ARTERY;  Surgeon: Elam Dutch, MD;  Location: [facility];  Service: Vascular;  Laterality: Right;  . ENDARTERECTOMY Left 11/18/2014   Procedure: CAROTID EXPOSURE;  Surgeon: Elam Dutch, MD;  Location: [facility];  Service: Vascular;  Laterality: Left;  . ENDARTERECTOMY Left 11/18/2014   Procedure: CLOSURE CAROTID;  Surgeon: Elam Dutch, MD;  Location: [facility];  Service: Vascular;  Laterality: Left;  . ENDARTERECTOMY Right 08/22/2017   Procedure: EXPOSURE CAROTID ARTERY FOR [place] SURGERY;  Surgeon: Elam Dutch, MD;  Location: [facility] OR;  Service: Vascular;  Laterality: Right;  . IR ANGIO INTRA EXTRACRAN SEL INTERNAL CAROTID UNI R MOD SED  08/22/2017  . IR ANGIOGRAM FOLLOW UP STUDY  08/22/2017  . IR RADIOLOGIST EVAL & MGMT  06/26/2016  . IR RADIOLOGIST EVAL & MGMT  05/08/2017  . IR RADIOLOGY PERIPHERAL GUIDED IV START  12/03/2017  . IR TRANSCATH/EMBOLIZ  08/22/2017  . IR US GUIDE VASC ACCESS RIGHT  12/03/2017  . LAPAROSCOPY    . NECK SURGERY  50 yrs ago   Left side tumor  . RADIOLOGY WITH ANESTHESIA N/A 05/25/2014   Procedure: RADIOLOGY WITH ANESTHESIA;  Surgeon: [name], MD;  Location: [facility];  Service: Radiology;  Laterality: N/A;  . RADIOLOGY WITH ANESTHESIA N/A 08/12/2014   Procedure: RADIOLOGY WITH ANESTHESIA;  Surgeon: [name], MD;  Location: [facility];  Service: Radiology;  Laterality: N/A;  . RADIOLOGY WITH ANESTHESIA N/A 03/15/2015   Procedure: MRI OF BRAIN WITHOUT CONTRAST;  Surgeon: Medication Radiologist, MD;  Location: [facility];  Service: Radiology;  Laterality: N/A;  DR. TAT/MRI  . US ECHOCARDIOGRAPHY  04-26-2009   EF 65-70%  . VESICOVAGINAL FISTULA CLOSURE W/ TAH  25 yrs ago  . WOUND EXPLORATION Right  08/22/2017   Procedure: REMOVAL OF RIGHT COMMON CAROTID SHEATH AND WOUND CLOSURE;  Surgeon: Rosetta Posner, MD;  Location: [facility] OR;  Service: Vascular;  Laterality: Right;     OB History   No obstetric history on file.     Family History  Problem Relation Age of Onset  . Emphysema Father   . Diabetes Father   . Aneurysm Sister   . Stroke Mother   . Hypertension Mother   . Heart disease Mother   . Hypertension Daughter   . Thyroid disease Daughter     Social History   Tobacco Use  . Smoking status: Former Smoker    Packs/day: 1.00    Years: 30.00    Pack years: 30.00    Types: Cigarettes    Quit  date: 04/26/2014    Years since quitting: 5.0  . Smokeless tobacco: Never Used  Substance Use Topics  . Alcohol use: No  . Drug use: No    Home Medications Prior to Admission medications   Medication Sig Start Date End Date Taking? Authorizing Provider  albuterol (PROVENTIL HFA;VENTOLIN HFA) 108 (90 BASE) MCG/ACT inhaler Inhale 1-2 puffs into the lungs every 6 (six) hours as needed for wheezing or shortness of breath.    [provider]  aspirin EC 81 MG tablet Take 81 mg by mouth daily with lunch.     [provider]  HYDROcodone-acetaminophen (NORCO/VICODIN) 5-325 MG tablet Take 1 tablet by mouth every 4 (four) hours as needed for up to 3 days. 05/24/19 05/27/19  Janeece Fitting, PA-C  lisinopril-hydrochlorothiazide (ZESTORETIC) 20-12.5 MG tablet TAKE 1 TABLET BY MOUTH DAILY. 10/07/18   Dorothy Spark, MD  ondansetron (ZOFRAN) 4 MG tablet Take 1 tablet (4 mg total) by mouth every 6 (six) hours for 5 days. 05/24/19 05/29/19  Janeece Fitting, PA-C  TOPROL XL 50 MG 24 hr tablet Take 1/2 tablet by mouth BID. Take with or immediately following a meal. 12/04/18   Dorothy Spark, MD    Allergies    Dilaudid [hydromorphone], Latex, Lidocaine, Sulfa drugs cross reactors, and Codeine  Review of Systems   Review of Systems  Constitutional: Negative for fever.  HENT: Negative  for sore throat.   Respiratory: Negative for shortness of breath.   Cardiovascular: Negative for chest pain.  Gastrointestinal: Negative for abdominal pain, nausea and vomiting.  Genitourinary: Negative for flank pain.  Musculoskeletal: Positive for arthralgias and myalgias. Negative for back pain.  Skin: Negative for pallor and wound.  Neurological: Negative for light-headedness and headaches.  All other systems reviewed and are negative.   Physical Exam Updated Vital Signs BP (!) 117/96   Pulse 60   Temp 97.9 F (36.6 C) (Oral)   Resp 20   SpO2 91%   Physical Exam Vitals and nursing note reviewed.  Constitutional:      Appearance: Normal appearance. She is not ill-appearing or toxic-appearing.  HENT:     Head: Normocephalic and atraumatic.     Nose: Nose normal.     Mouth/Throat:     Mouth: Mucous membranes are moist.  Eyes:     Pupils: Pupils are equal, round, and reactive to light.  Cardiovascular:     Rate and Rhythm: Bradycardia present.  Pulmonary:     Effort: Pulmonary effort is normal.     Breath sounds: No wheezing or rales.  Abdominal:     General: Abdomen is flat.     Tenderness: There is no abdominal tenderness.  Musculoskeletal:     Right shoulder: Swelling, deformity, tenderness and bony tenderness present. No laceration. Decreased range of motion. Decreased strength. Normal pulse.     Left shoulder: Normal. No swelling, deformity, effusion, laceration, tenderness, bony tenderness or crepitus. Normal range of motion. Normal strength. Normal pulse.     Cervical back: Normal range of motion and neck supple.     Right hip: Normal. Normal range of motion. Normal strength.     Left hip: Normal range of motion. Normal strength.     Comments: Pulses present and symmetric, limited ROM to right shoulder due to pain. Palpable deformity along humeral head. Decrease in strength, sensation intact.   Skin:    General: Skin is warm and dry.  Neurological:     Mental  Status: She is alert and  oriented to person, place, and time.     ED Results / Procedures / Treatments   Labs (all labs ordered are listed, but only abnormal results are displayed) Labs Reviewed - No data to display  EKG None  Radiology DG Shoulder Right  Result Date: 05/24/2019 CLINICAL DATA:  Right wrist and shoulder pain after fall while working on patio this afternoon. EXAM: RIGHT SHOULDER - 2+ VIEW COMPARISON:  None. FINDINGS: Proximal humerus fractures impacted and mildly displaced and comminuted, with dominant fracture plane through the anatomic neck. There is displacement of less than 1 cm. Fracture involves the lateral aspect of the humeral head. No definite glenohumeral involvement. No dislocation. The acromioclavicular joint is congruent. IMPRESSION: Mildly displaced and comminuted proximal humerus fracture. Electronically Signed   By: Keith Rake M.D.   On: 05/24/2019 15:02   DG Wrist Complete Right  Result Date: 05/24/2019 CLINICAL DATA:  Right wrist and shoulder pain after fall while working on patio this afternoon. EXAM: RIGHT WRIST - COMPLETE 3+ VIEW COMPARISON:  None. FINDINGS: There is no evidence of fracture or dislocation. Normal alignment. Minor osteoarthritis primarily involving the base of the thumb. Soft tissues are unremarkable. IMPRESSION: No fracture or subluxation of the right wrist. Electronically Signed   By: Keith Rake M.D.   On: 05/24/2019 15:02    Procedures Procedures (including critical care time)  Medications Ordered in ED Medications  ondansetron (ZOFRAN) injection 4 mg (4 mg Intravenous Given 05/24/19 1325)  ketorolac (TORADOL) 15 MG/ML injection 15 mg (15 mg Intravenous Given 05/24/19 1328)  sodium chloride 0.9 % bolus 500 mL (0 mLs Intravenous Stopped 05/24/19 1552)  HYDROcodone-acetaminophen (NORCO/VICODIN) 5-325 MG per tablet 1 tablet (1 tablet Oral Given 05/24/19 1500)  ondansetron (ZOFRAN) injection 4 mg (4 mg Intravenous Given 05/24/19 1552)      ED Course  I have reviewed the triage vital signs and the nursing notes.  Pertinent labs & imaging results that were available during my care of the patient were reviewed by me and considered in my medical decision making (see chart for details).    MDM Rules/Calculators/A&P   Patient with an extensive past medical history arrived from home via EMS status post mechanical fall while hanging out and sweeping her porch this morning with family member at the bedside.  Patient does report pain along the right shoulder, was able to stand from the ground with assistance via EMS, did have some right hip pain but this subsided.  He was given 50 mics of fentanyl in route with EMS.  She became diaphoretic after receiving medication.  She reports a low tolerance for narcotics.  During evaluation pressures are slightly soft, given a 500 bolus of normal saline.  She does report discomfort to the right shoulder still, given Toradol for pain.  Limited range of motion of the right shoulder, pulses are present and sensation is intact.  Obvious deformity to right likely humeral head.  Patient also reporting nausea given Zofran for symptomatic control.  X-ray of her right shoulder along with right wrist have been obtained.   Xray of the shoulder: Mildly displaced and comminuted proximal humerus fracture.     Right wrist without any fracture or dislocation.  3:27 PM spoke to orthopedics on-call, who agreed with plan such as having patient placed in sling along with follow-up in outpatient basis this week.  She will be placed on a right shoulder sling to help with pain, RICE therapy was encouraged.  Patient was informed of his results,  she was given a plan far as medication management for her right wrist fracture.  She is call her [facility] in order to schedule an appointment.  Daughter at the bedside is understanding of plan and management.  Was given Norco while in the ED, reports improvement in her  symptoms, will be discharged on this oral therapy along with Zofran to help with nausea.  Precautions discussed at length, patient stable for discharge.     Portions of this note were generated with Lobbyist. Dictation errors may occur despite best attempts at proofreading.  Final Clinical Impression(s) / ED Diagnoses Final diagnoses:  Fall, initial encounter  Closed fracture of proximal end of right humerus, unspecified fracture morphology, initial encounter    Rx / DC Orders ED Discharge Orders         Ordered    HYDROcodone-acetaminophen (NORCO/VICODIN) 5-325 MG tablet  Every 4 hours PRN     05/24/19 1538    ondansetron (ZOFRAN) 4 MG tablet  Every 6 hours     05/24/19 1538           Janeece Fitting, PA-C 05/24/19 1633    Lajean Saver, MD 05/25/19 [phone]

## 2019-05-24 NOTE — ED Triage Notes (Signed)
Pt bib [facility] after mechanical fall. No blood thinners, no LOC, no obvious deformities, neuro intact. AOx4. Pt landed on R hip and then R shoulder, c/o 10/10 R shoulder pain. Pt received 50 mcg of fetanyl w/ EMS w/ no relief. EMS reports that when pt was given fentanyl she became dizzy, pale, diaphoretic, and nauseated w/ bradycardia from 65-45 bpm. All other EMS VSS.

## 2019-06-02 ENCOUNTER — Ambulatory Visit (HOSPITAL_COMMUNITY): Admission: RE | Admit: 2019-06-02 | Payer: Medicare Other | Source: Ambulatory Visit

## 2019-07-08 ENCOUNTER — Telehealth: Payer: Self-pay | Admitting: Cardiology

## 2019-07-08 NOTE — Telephone Encounter (Signed)
Pt c/o swelling: STAT is pt has developed SOB within 24 hours  1) How much weight have you gained and in what time span? Daughter says she have- but did not know how much she had gained  2) If swelling, where is the swelling located? Legs and feet- Home Health nurse said she needs to be seen  3) Are you currently taking a fluid pill? yes  4) Are you currently SOB?  She did not know  5) Do you have a log of your daily weights (if so, list)? no  6) Have you gained 3 pounds in a day or 5 pounds in a week?  Did not know  7) Have you traveled recently?no- pt wants s an appt asapt

## 2019-07-08 NOTE — Telephone Encounter (Signed)
Spoke with pt and has noted lower extremity edema with the left worse than the right the patient is currently taking Lisinopril/HCTZ 20/12.5 mg Per pt does not hardly eat any salt  "I eat a lot of fruit" Appt made end of June with Dr Meda Coffee for over due follow up Encouraged pt  to watch salt intake and elevate legs as much as possible during waking hours Per pt when awakens in the am swelling not as bad but as day progresses legs get bigger. Will forward to Dr Meda Coffee for review and recommendations ./cy

## 2019-07-09 MED ORDER — FUROSEMIDE 40 MG PO TABS: 40.0000 mg | ORAL_TABLET | Freq: Every day | ORAL | 0 refills | Status: DC

## 2019-07-09 NOTE — Telephone Encounter (Signed)
Please start her on lasix 40 mg po daily

## 2019-07-09 NOTE — Telephone Encounter (Signed)
Spoke with the pt and informed her that per Dr. Meda Coffee, we will start her on lasix 40 mg po daily.  Confirmed the pharmacy of choice with the pt.  Informed the pt that we will see her as planned for 6/29 at 1:40 pm, in clinic with Dr. Meda Coffee. Advised the pt to make sure she is getting a healthy intake of K in her diet.  Advised her to maintain low sodium diet, elevate extremities at rest, and stay hydrated.  Pt verbalized understanding and agrees with this plan.

## 2019-07-22 ENCOUNTER — Encounter: Payer: Self-pay | Admitting: Cardiology

## 2019-07-22 ENCOUNTER — Ambulatory Visit (INDEPENDENT_AMBULATORY_CARE_PROVIDER_SITE_OTHER): Payer: Medicare Other | Admitting: Cardiology

## 2019-07-22 ENCOUNTER — Other Ambulatory Visit: Payer: Self-pay

## 2019-07-22 VITALS — BP 118/76 | HR 59 | Ht 61.0 in | Wt 167.0 lb

## 2019-07-22 DIAGNOSIS — I5033 Acute on chronic diastolic (congestive) heart failure: Secondary | ICD-10-CM | POA: Diagnosis not present

## 2019-07-22 DIAGNOSIS — I2510 Atherosclerotic heart disease of native coronary artery without angina pectoris: Secondary | ICD-10-CM

## 2019-07-22 DIAGNOSIS — I10 Essential (primary) hypertension: Secondary | ICD-10-CM

## 2019-07-22 DIAGNOSIS — I890 Lymphedema, not elsewhere classified: Secondary | ICD-10-CM

## 2019-07-22 MED ORDER — FUROSEMIDE 40 MG PO TABS: 40.0000 mg | ORAL_TABLET | Freq: Every day | ORAL | 1 refills | Status: DC

## 2019-07-22 MED ORDER — SPIRONOLACTONE 25 MG PO TABS: 12.5000 mg | ORAL_TABLET | Freq: Every day | ORAL | 1 refills | Status: DC

## 2019-07-22 NOTE — Progress Notes (Signed)
Cardiology Office Note:    Date:  07/22/2019   ID:  [name], DOB 1945-04-17, MRN [id number]  PCP:  [name], MD  [facility] Cardiologist:  Ena Dawley, MD  [facility] Electrophysiologist:  None   Referring MD: [name], MD   Reason for visit: LE edema  History of Present Illness:    Jeanne Lawson is a 74 y.o. female with a hx of multiple strokes, hypertension, coronary artery disease with cardiac cath and angioplasty approximately 20 years ago but no stents placement.  We do not have record about that procedure. She does have a chronically abnormal EKG with anterolateral T wave inversions. She was also found to have enlarging aneurysms of the posterior circulation that was stented by Dr. 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 She has recovered well from this. She is no longer on Plavix but takes ASA daily.   She is coming for annual follow-up, she has been doing okay until May 1 when she tripped and broke her shoulder, this has led to further immobility and limited walking and her chronic lymphedema and lower extremity edema has got worse.  She has gained 9 pounds.  Her daughter called last week and we have started her on Lasix 40 mg daily however patient did not get it till Friday and was reluctant to take it so she was only taking half pill.  She has responded with significant diuresis she does not know exactly how many pounds.  She denies any chest pain, no orthopnea or proximal nocturnal dyspnea.  Past Medical History:  Diagnosis Date  . Cancer (HCC)    right leg skin   . COPD (chronic obstructive pulmonary disease) ([place])   . Coronary artery disease 1996   Known with prior mild lesion of LAD demonstrated by Cardiac Catheterization in 1996  . Edema of foot    She has a  history of chronic edema of the left dated back to age 61 when she suufered severe frostbite playing  on the snow as a child  . Hypertension   . PAC (premature atrial contraction)   . Pancreatitis    x2  . PONV (postoperative nausea and vomiting)    problems waking up last time 4/16 only time  . 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  Marland Kitchen Shortness of breath dyspnea    since stroke 2 months ago -  . Stroke [facility]) 04/2014   She had had a previous thrombotic stroke  involving the right corona radiata in October 2010; left arm and leg weakness  . TIA (transient ischemic attack)    She was hospitalized 04-23-09 through 04-27-09 for involving right side of the body  . Tobacco abuse    Ongoing   . Ventricular hypertrophy 04/2009   LVH with diastolic dysfunction by echo. Has normal EF.    Past Surgical History:  Procedure Laterality Date  . ABDOMINAL HYSTERECTOMY    . APPENDECTOMY    . CARDIAC CATHETERIZATION  1996   Mild CAD with vasospasm  . CARDIOVASCULAR STRESS TEST  12-02-2001   EF 70%  . CHOLECYSTECTOMY N/A 03/09/2016   Procedure: LAPAROSCOPIC CHOLECYSTECTOMY WITH INTRAOPERATIVE CHOLANGIOGRAM;  Surgeon: Georganna Skeans, MD;  Location: [place];  Service: General;  Laterality: N/A;  .  ENDARTERECTOMY Right 08/12/2014   Procedure: RIGHT  COMMON CAROTID ARTERY EXPOSURE FOR INTERVENTIONAL RADIOLOGY PROCEDURE BY DR.DEVASHWAR,Insertion 6 FR sheath;  Surgeon: Elam Dutch, MD;  Location: [place];  Service: Vascular;  Laterality: Right;  . ENDARTERECTOMY Right 08/12/2014   Procedure:  CAROTID  EXPOSURE CLOSURE RIGHT NECK, REPAIR RIGHT COMMON CAROTID ARTERY;  Surgeon: Elam Dutch, MD;  Location: [place];  Service: Vascular;  Laterality: Right;  . ENDARTERECTOMY Left 11/18/2014   Procedure: CAROTID EXPOSURE;  Surgeon: Elam Dutch, MD;  Location: [place];  Service: Vascular;  Laterality: Left;  . ENDARTERECTOMY Left  11/18/2014   Procedure: CLOSURE CAROTID;  Surgeon: Elam Dutch, MD;  Location: [place];  Service: Vascular;  Laterality: Left;  . ENDARTERECTOMY Right 08/22/2017   Procedure: EXPOSURE CAROTID ARTERY FOR [place] SURGERY;  Surgeon: Elam Dutch, MD;  Location: [facility] OR;  Service: Vascular;  Laterality: Right;  . IR ANGIO INTRA EXTRACRAN SEL INTERNAL CAROTID UNI R MOD SED  08/22/2017  . IR ANGIOGRAM FOLLOW UP STUDY  08/22/2017  . IR RADIOLOGIST EVAL & MGMT  06/26/2016  . IR RADIOLOGIST EVAL & MGMT  05/08/2017  . IR RADIOLOGY PERIPHERAL GUIDED IV START  12/03/2017  . IR TRANSCATH/EMBOLIZ  08/22/2017  . IR US GUIDE VASC ACCESS RIGHT  12/03/2017  . LAPAROSCOPY    . NECK SURGERY  50 yrs ago   Left side tumor  . RADIOLOGY WITH ANESTHESIA N/A 05/25/2014   Procedure: RADIOLOGY WITH ANESTHESIA;  Surgeon: [name], MD;  Location: [place];  Service: Radiology;  Laterality: N/A;  . RADIOLOGY WITH ANESTHESIA N/A 08/12/2014   Procedure: RADIOLOGY WITH ANESTHESIA;  Surgeon: [name], MD;  Location: [place];  Service: Radiology;  Laterality: N/A;  . RADIOLOGY WITH ANESTHESIA N/A 03/15/2015   Procedure: MRI OF BRAIN WITHOUT CONTRAST;  Surgeon: Medication Radiologist, MD;  Location: [place];  Service: Radiology;  Laterality: N/A;  DR. TAT/MRI  . US ECHOCARDIOGRAPHY  04-26-2009   EF 65-70%  . VESICOVAGINAL FISTULA CLOSURE W/ TAH  25 yrs ago  . WOUND EXPLORATION Right 08/22/2017   Procedure: REMOVAL OF RIGHT COMMON CAROTID SHEATH AND WOUND CLOSURE;  Surgeon: Rosetta Posner, MD;  Location: [facility] OR;  Service: Vascular;  Laterality: Right;    Current Medications: Current Meds  Medication Sig  . albuterol (PROVENTIL HFA;VENTOLIN HFA) 108 (90 BASE) MCG/ACT inhaler Inhale 1-2 puffs into the lungs every 6 (six) hours as needed for wheezing or shortness of breath.  Marland Kitchen aspirin EC 81 MG tablet Take 81 mg by mouth daily with lunch.   . furosemide (LASIX) 40 MG tablet Take 1 tablet (40 mg total) by mouth daily.  .  TOPROL XL 50 MG 24 hr tablet Take 1/2 tablet by mouth BID. Take with or immediately following a meal.  . [DISCONTINUED] furosemide (LASIX) 40 MG tablet Take 1 tablet (40 mg total) by mouth daily.  . [DISCONTINUED] lisinopril-hydrochlorothiazide (ZESTORETIC) 20-12.5 MG tablet TAKE 1 TABLET BY MOUTH DAILY.     Allergies:   Dilaudid [hydromorphone], Latex, Lidocaine, Sulfa drugs cross reactors, and Codeine   Social History   Socioeconomic History  . Marital status: Widowed    Spouse name: Not on file  . Number of children: Not on file  . Years of education: Not on file  . Highest education level: Not on file  Occupational History  . Not on file  Tobacco Use  . Smoking status: Former Smoker    Packs/day: 1.00    Years: 30.00  Pack years: 30.00    Types: Cigarettes    Quit date: 04/26/2014    Years since quitting: 5.2  . Smokeless tobacco: Never Used  Vaping Use  . Vaping Use: Never used  Substance and Sexual Activity  . Alcohol use: No  . Drug use: No  . Sexual activity: Not on file  Other Topics Concern  . Not on file  Social History Narrative  . Not on file   Social Determinants of Health   Financial Resource Strain:   . Difficulty of Paying Living Expenses:   Food Insecurity:   . Worried About Charity fundraiser in the Last Year:   . Arboriculturist in the Last Year:   Transportation Needs:   . Film/video editor (Medical):   Marland Kitchen Lack of Transportation (Non-Medical):   Physical Activity:   . Days of Exercise per Week:   . Minutes of Exercise per Session:   Stress:   . Feeling of Stress :   Social Connections:   . Frequency of Communication with Friends and Family:   . Frequency of Social Gatherings with Friends and Family:   . Attends Religious Services:   . Active Member of Clubs or Organizations:   . Attends Archivist Meetings:   Marland Kitchen Marital Status:      Family History: The patient's family history includes Aneurysm in her sister; Diabetes in  her father; Emphysema in her father; Heart disease in her mother; Hypertension in her daughter and mother; Stroke in her mother; Thyroid disease in her daughter.  ROS:   Please see the history of present illness.    All other systems reviewed and are negative.  EKGs/Labs/Other Studies Reviewed:    The following studies were reviewed today:  EKG:  EKG is  ordered today.  The ekg ordered today demonstrates sinus bradycardia normal EKG, this was personally reviewed.  Recent Labs: 05/19/2019: Creatinine, Ser 0.70  Recent Lipid Panel    Component Value Date/Time   CHOL 147 11/27/2016 1019   TRIG 92 08/22/2017 2056   HDL 65 11/27/2016 1019   CHOLHDL 2.3 11/27/2016 1019   CHOLHDL 2.1 03/08/2016 0447   VLDL 14 03/08/2016 0447   LDLCALC 56 11/27/2016 1019   Physical Exam:    VS:  BP 118/76   Pulse (!) 59   Ht '5\' 1"'  (1.549 m)   Wt 167 lb (75.8 kg)   SpO2 95%   BMI 31.55 kg/m     Wt Readings from Last 3 Encounters:  07/22/19 167 lb (75.8 kg)  05/30/18 158 lb (71.7 kg)  09/06/17 157 lb (71.2 kg)    GEN:  Well nourished, well developed in no acute distress, sitting in a wheelchair HEENT: Normal NECK: No JVD; No carotid bruits LYMPHATICS: No lymphadenopathy CARDIAC: RRR, no murmurs, rubs, gallops RESPIRATORY:  Clear to auscultation without rales, wheezing or rhonchi  ABDOMEN: Soft, non-tender, non-distended MUSCULOSKELETAL: 4+ bilateral lower extremity edema most prominent on her feet that do not fit her shoes, this is extending up to her thighs.  I am not able to palpate her pulses. SKIN: Warm and dry NEUROLOGIC:  Alert and oriented x 3 PSYCHIATRIC:  Normal affect   ASSESSMENT:    1. Acute on chronic diastolic CHF (congestive heart failure), NYHA class 3 ([place])   2. Chronic acquired lymphedema   3. Coronary artery disease involving native coronary artery of native heart without angina pectoris   4. Essential hypertension    PLAN:  In order of problems listed above:   Acute diastolic CHF, chronic lymphedema -The patient has underlying lymphedema however this is significantly worse with 9 pound weight gain -We will repeat echocardiogram and obtain all the labs today including BNP -We will start Lasix 40 mg daily and spironolactone 12.5 mg daily -I will hold Zestoretic for now if decreased LVEF we will reinitiate ACE inhibitor or ARB. -We will try to arrange for lymphedema wrapping with home care. -Follow-up in 3 weeks with repeat of BMP and BNP.  CAD: She has a history of prior coronary artery disease and 18 years ago Dr. Ilda Foil did cardiac catheterization and angioplasty but no stent. We don't have those records. The patient has had several subsequent Cardiolite stress tests which have been normal. She denies any anginal symptoms. No CP or dyspnea. No exertional symptoms. Remains on ASA and Metoprolol.   HTN: Changes as above.  H/o Strokes: followed by neuro. No longer on Plavix due to h/o bleeding and gastric irrigation. Doing ok on ASA. No stroke like symptoms.   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 Doing well.   Medication Adjustments/Labs and Tests Ordered: Current medicines are reviewed at length with the patient today.  Concerns regarding medicines are outlined above.  Orders Placed This Encounter  Procedures  . Comp Met (CMET)  . CBC  . Pro b natriuretic peptide  . TSH  . Basic metabolic panel  . Pro b natriuretic peptide  . Ambulatory referral to Physical Therapy  . EKG 12-Lead  . ECHOCARDIOGRAM COMPLETE   Meds ordered this encounter  Medications  . furosemide (LASIX) 40 MG tablet    Sig: Take 1 tablet (40 mg total) by mouth daily.    Dispense:  90 tablet    Refill:  1  . spironolactone (ALDACTONE) 25 MG tablet     Sig: Take 0.5 tablets (12.5 mg total) by mouth daily.    Dispense:  45 tablet    Refill:  1    Patient Instructions  Medication Instructions:   STOP TAKING ZESTORECTIC NOW  PLEASE START TAKING YOUR LASIX 40 MG BY MOUTH DAILY  START TAKING SPIRONOLACTONE 12.5 MG BY MOUTH DAILY  *If you need a refill on your cardiac medications before your next appointment, please call your pharmacy*  Lab Work:  TODAY--CMET, CBC, TSH, AD PRO-BNP  ALSO IN 3-4 WEEKS--SAME DAY AS YOU SEE THE EXTENDER IN OUR OFFICE--WE WILL CHECK BMET AND PRO-BNP AT THAT TIME.  If you have labs (blood work) drawn today and your tests are completely normal, you will receive your results only by: Marland Kitchen MyChart Message (if you have MyChart) OR . A paper copy in the mail If you have any lab test that is abnormal or we need to change your treatment, we will call you to review the results.  You have been referred to [place]   Testing/Procedures:  Your physician has requested that you have an echocardiogram. Echocardiography is a painless test that uses sound waves to create images of your heart. It provides your doctor with information about the size and shape of your heart and how well your heart's chambers and valves are working. This procedure takes approximately one hour. There are no restrictions for this procedure.  Follow-Up:  IN 3 -4 WEEKS TO SEE  AN EXTENDER IN OUR OFFICE--YOU WILL NEED TO HAVE YOUR 2ND LAB APPOINTMENT SCHEDULED SAME DAY AS THIS APPOINTMENT     Signed, Ena Dawley, MD  07/22/2019 3:05 PM    [facility]

## 2019-07-22 NOTE — Patient Instructions (Signed)
Medication Instructions:   STOP TAKING ZESTORECTIC NOW  PLEASE START TAKING YOUR LASIX 40 MG BY MOUTH DAILY  START TAKING SPIRONOLACTONE 12.5 MG BY MOUTH DAILY  *If you need a refill on your cardiac medications before your next appointment, please call your pharmacy*  Lab Work:  TODAY--CMET, CBC, TSH, AD PRO-BNP  ALSO IN 3-4 WEEKS--SAME DAY AS YOU SEE THE EXTENDER IN OUR OFFICE--WE WILL CHECK BMET AND PRO-BNP AT THAT TIME.  If you have labs (blood work) drawn today and your tests are completely normal, you will receive your results only by: Marland Kitchen MyChart Message (if you have MyChart) OR . A paper copy in the mail If you have any lab test that is abnormal or we need to change your treatment, we will call you to review the results.  You have been referred to [place]   Testing/Procedures:  Your physician has requested that you have an echocardiogram. Echocardiography is a painless test that uses sound waves to create images of your heart. It provides your doctor with information about the size and shape of your heart and how well your heart's chambers and valves are working. This procedure takes approximately one hour. There are no restrictions for this procedure.  Follow-Up:  IN 3 -4 WEEKS TO SEE AN EXTENDER IN OUR OFFICE--YOU WILL NEED TO HAVE YOUR 2ND LAB APPOINTMENT SCHEDULED SAME DAY AS THIS APPOINTMENT

## 2019-07-23 ENCOUNTER — Telehealth: Payer: Self-pay | Admitting: Cardiology

## 2019-07-23 LAB — COMPREHENSIVE METABOLIC PANEL
ALT: 7 IU/L (ref 0–32)
AST: 11 IU/L (ref 0–40)
Albumin/Globulin Ratio: 1.5 (ref 1.2–2.2)
Albumin: 4 g/dL (ref 3.7–4.7)
Alkaline Phosphatase: 74 IU/L (ref 48–121)
BUN/Creatinine Ratio: 14 (ref 12–28)
BUN: 10 mg/dL (ref 8–27)
Bilirubin Total: 0.8 mg/dL (ref 0.0–1.2)
CO2: 27 mmol/L (ref 20–29)
Calcium: 9.1 mg/dL (ref 8.7–10.3)
Chloride: 101 mmol/L (ref 96–106)
Creatinine, Ser: 0.72 mg/dL (ref 0.57–1.00)
GFR calc Af Amer: 95 mL/min/{1.73_m2} (ref >59)
GFR calc non Af Amer: 82 mL/min/{1.73_m2} (ref >59)
Globulin, Total: 2.6 g/dL (ref 1.5–4.5)
Glucose: 80 mg/dL (ref 65–99)
Potassium: 3.7 mmol/L (ref 3.5–5.2)
Sodium: 140 mmol/L (ref 134–144)
Total Protein: 6.6 g/dL (ref 6.0–8.5)

## 2019-07-23 LAB — CBC
Hematocrit: 32.2 % — ABNORMAL LOW (ref 34.0–46.6)
Hemoglobin: 10.6 g/dL — ABNORMAL LOW (ref 11.1–15.9)
MCH: 27 pg (ref 26.6–33.0)
MCHC: 32.9 g/dL (ref 31.5–35.7)
MCV: 82 fL (ref 79–97)
Platelets: 116 10*3/uL — ABNORMAL LOW (ref 150–450)
RBC: 3.93 x10E6/uL (ref 3.77–5.28)
RDW: 15.1 % (ref 11.7–15.4)
WBC: 3.8 10*3/uL (ref 3.4–10.8)

## 2019-07-23 LAB — PRO B NATRIURETIC PEPTIDE: NT-Pro BNP: 466 pg/mL (ref 0–738)

## 2019-07-23 LAB — TSH: TSH: 2.55 u[IU]/mL (ref 0.450–4.500)

## 2019-07-23 NOTE — Telephone Encounter (Signed)
New message  RN Supervisor Ailene Ravel from [facility] is calling in to get orders for a nurse to go out to patient's home to wrap legs and address edema. Orders can be given verbally or faxed to [phone]. Please assist.

## 2019-07-23 NOTE — Telephone Encounter (Signed)
Called [facility] back to ask and speak with Ailene Ravel, and receptionist advised that I call her back in 10 mins, for she will be available at that time. Will follow-up later.

## 2019-07-23 NOTE — Telephone Encounter (Signed)
Spoke with Ailene Ravel RN with [facility], and gave her verbal orders per Dr. Meda Coffee, for them to send someone to the pts home to assess and address/wrap her legs, for diagnosis of lymphedema.  Ailene Ravel RN states they will go and assess the pt first, and report back to [place] with any orders that need to be reviewed and signed off on by Dr. Meda Coffee, thereafter.  Provided Kaleen Odea with our fax number [phone] to send any documentation/orders that need to be sent for Dr. Meda Coffee to review and advise on.  Ailene Ravel RN states based on the nurses assessment who goes into the pts home, will determine how many visits it will take for them to treat this issues.  She states she will fax over this report to let Dr. Meda Coffee know, after first initial assessment is complete.  Ailene Ravel RN verbalized understanding and agrees with this plan.  Ailene Ravel was gracious for all the assistance provided.

## 2019-08-12 ENCOUNTER — Other Ambulatory Visit (HOSPITAL_COMMUNITY): Payer: Medicare Other

## 2019-08-19 ENCOUNTER — Encounter (HOSPITAL_COMMUNITY): Payer: Self-pay | Admitting: Cardiology

## 2019-08-26 ENCOUNTER — Encounter: Payer: Self-pay | Admitting: Physician Assistant

## 2019-08-26 NOTE — Progress Notes (Deleted)
Cardiology Office Note    Date:  08/26/2019   ID:  Jeanne, Lawson Jan 10, 1946, MRN [id number]  PCP:  [name], MD  Cardiologist:  Ena Dawley, MD  Electrophysiologist:  None   Chief Complaint: f/u swelling  History of Present Illness:   Jeanne Lawson is a 74 y.o. female with history of CAD with reported history of angioplasty 20 years ago, multiple strokes, brain aneurysm, COPD, HTN, PACs, TIA, tobacco abuse, anemia/thrombocytopenia noted on previous labs, chronic edema who presents for f/u of CHF. This is my first time meeting her. Per chart review, she has a history of prior coronary artery disease and 18 years ago Dr. Ilda Foil did cardiac catheterization and angioplasty but no stent. We don't have those records. The patient has had several subsequent Cardiolite stress tests which have been normal but have been poorly tolerated by the patient and she does not want any further nuclear stress tests. She does have a chronically abnormal EKG with anterolateral T wave inversions. She has had recurrent strokes. Last echo 02/2015 showed EF 60-65%, moderate LVH, grade 1 DD. She has also had intracranial circulation aneurysm s/p prior intervention by Dr. [name] and Dr. Oneida Alar. She is no longer on Plavix but takes ASA daily. In May 2021 she tripped and broke her shoulder.  This led to further immobility and limited walking and her chronic lymphedema and lower extremity edema got worse. She also had noted weight gain. She was started on Lasix by phone note but was initially hesitantt to take so only started 1/2 tablet. At [facility] 07/22/19 by Dr. Meda Coffee, she recommended starting Lasix 40mg  daily and spironolactone 12.5mg  daily. Home care with lymphedema wrapping was recommended. Zestoretic was held. F/u 2D echo still pending. Labs 07/22/19 as noted below with normal BNP, normal albumin.  has not yet had any labs to f/u spironolactone - needs today zestorietic? no show echo 7/20 d-dimer,  duplex   Lower exremity edema CAD Essential HTN Anemia/thrombocytopenia noted on labs   Labwork independently reviewed: 07/22/19 TSH wnl, pBNP wnl, Hgb 10.6, plt 116, CMET OK, K 3.7, Cr 0.72, albumin 4.0 07/2018 LDL 65    Past Medical History:  Diagnosis Date  . Cancer (HCC)    right leg skin   . COPD (chronic obstructive pulmonary disease) ([place])   . Coronary artery disease 1996   Known with prior mild lesion of LAD demonstrated by Cardiac Catheterization in 1996  . Edema of foot    She has a history of chronic edema of the left dated back to age 23 when she suufered severe frostbite playing  on the snow as a child  . Hypertension   . Lower extremity edema   . PAC (premature atrial contraction)   . Pancreatitis    x2  . PONV (postoperative nausea and vomiting)    problems waking up last time 4/16 only time  . 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  . Stroke ([place]) 04/2014   She had had a previous thrombotic stroke  involving the right corona radiata in October 2010; left arm and leg weakness  . TIA (transient ischemic attack)    She was hospitalized 04-23-09 through 04-27-09 for involving right side of the body  . Tobacco abuse    Ongoing   . Ventricular hypertrophy 04/2009   LVH with diastolic dysfunction by echo. Has normal EF.  Past Surgical History:  Procedure Laterality Date  . ABDOMINAL HYSTERECTOMY    . APPENDECTOMY    . CARDIAC CATHETERIZATION  1996   Mild CAD with vasospasm  . CARDIOVASCULAR STRESS TEST  12-02-2001   EF 70%  . CHOLECYSTECTOMY N/A 03/09/2016   Procedure: LAPAROSCOPIC CHOLECYSTECTOMY WITH INTRAOPERATIVE CHOLANGIOGRAM;  Surgeon: Georganna Skeans, MD;  Location: [facility];  Service: General;  Laterality: N/A;  . ENDARTERECTOMY Right 08/12/2014   Procedure: RIGHT  COMMON CAROTID ARTERY EXPOSURE FOR INTERVENTIONAL RADIOLOGY PROCEDURE BY DR.DEVASHWAR,Insertion 6 FR sheath;   Surgeon: Elam Dutch, MD;  Location: [facility];  Service: Vascular;  Laterality: Right;  . ENDARTERECTOMY Right 08/12/2014   Procedure:  CAROTID  EXPOSURE CLOSURE RIGHT NECK, REPAIR RIGHT COMMON CAROTID ARTERY;  Surgeon: Elam Dutch, MD;  Location: [facility];  Service: Vascular;  Laterality: Right;  . ENDARTERECTOMY Left 11/18/2014   Procedure: CAROTID EXPOSURE;  Surgeon: Elam Dutch, MD;  Location: [facility];  Service: Vascular;  Laterality: Left;  . ENDARTERECTOMY Left 11/18/2014   Procedure: CLOSURE CAROTID;  Surgeon: Elam Dutch, MD;  Location: [facility];  Service: Vascular;  Laterality: Left;  . ENDARTERECTOMY Right 08/22/2017   Procedure: EXPOSURE CAROTID ARTERY FOR [place] SURGERY;  Surgeon: Elam Dutch, MD;  Location: [facility];  Service: Vascular;  Laterality: Right;  . IR ANGIO INTRA EXTRACRAN SEL INTERNAL CAROTID UNI R MOD SED  08/22/2017  . IR ANGIOGRAM FOLLOW UP STUDY  08/22/2017  . IR RADIOLOGIST EVAL & MGMT  06/26/2016  . IR RADIOLOGIST EVAL & MGMT  05/08/2017  . IR RADIOLOGY PERIPHERAL GUIDED IV START  12/03/2017  . IR TRANSCATH/EMBOLIZ  08/22/2017  . IR US GUIDE VASC ACCESS RIGHT  12/03/2017  . LAPAROSCOPY    . NECK SURGERY  50 yrs ago   Left side tumor  . RADIOLOGY WITH ANESTHESIA N/A 05/25/2014   Procedure: RADIOLOGY WITH ANESTHESIA;  Surgeon: [name], MD;  Location: [facility];  Service: Radiology;  Laterality: N/A;  . RADIOLOGY WITH ANESTHESIA N/A 08/12/2014   Procedure: RADIOLOGY WITH ANESTHESIA;  Surgeon: [name], MD;  Location: [facility];  Service: Radiology;  Laterality: N/A;  . RADIOLOGY WITH ANESTHESIA N/A 03/15/2015   Procedure: MRI OF BRAIN WITHOUT CONTRAST;  Surgeon: Medication Radiologist, MD;  Location: [facility];  Service: Radiology;  Laterality: N/A;  DR. TAT/MRI  . US ECHOCARDIOGRAPHY  04-26-2009   EF 65-70%  . VESICOVAGINAL FISTULA CLOSURE W/ TAH  25 yrs ago  . WOUND EXPLORATION Right 08/22/2017   Procedure: REMOVAL OF RIGHT COMMON CAROTID SHEATH AND  WOUND CLOSURE;  Surgeon: Rosetta Posner, MD;  [facility];  Service: Vascular;  Laterality: Right;    Current Medications: No outpatient medications have been marked as taking for the 08/27/19 encounter (Appointment) with Charlie Pitter, PA-C.   ***   Allergies:   Dilaudid [hydromorphone], Latex, Lidocaine, Sulfa drugs cross reactors, and Codeine   Social History   Socioeconomic History  . Marital status: Widowed    Spouse name: Not on file  . Number of children: Not on file  . Years of education: Not on file  . Highest education level: Not on file  Occupational History  . Not on file  Tobacco Use  . Smoking status: Former Smoker    Packs/day: 1.00    Years: 30.00    Pack years: 30.00    Types: Cigarettes    Quit date: 04/26/2014    Years since quitting: 5.3  . Smokeless tobacco: Never  Used  Vaping Use  . Vaping Use: Never used  Substance and Sexual Activity  . Alcohol use: No  . Drug use: No  . Sexual activity: Not on file  Other Topics Concern  . Not on file  Social History Narrative  . Not on file   Social Determinants of Health   Financial Resource Strain:   . Difficulty of Paying Living Expenses:   Food Insecurity:   . Worried About Charity fundraiser in the Last Year:   . Arboriculturist in the Last Year:   Transportation Needs:   . Film/video editor (Medical):   Marland Kitchen Lack of Transportation (Non-Medical):   Physical Activity:   . Days of Exercise per Week:   . Minutes of Exercise per Session:   Stress:   . Feeling of Stress :   Social Connections:   . Frequency of Communication with Friends and Family:   . Frequency of Social Gatherings with Friends and Family:   . Attends Religious Services:   . Active Member of Clubs or Organizations:   . Attends Archivist Meetings:   Marland Kitchen Marital Status:      Family History:  The patient's ***family history includes Aneurysm in her sister; Diabetes in her father; Emphysema in her father; Heart disease  in her mother; Hypertension in her daughter and mother; Stroke in her mother; Thyroid disease in her daughter.  ROS:   Please see the history of present illness. Otherwise, review of systems is positive for ***.  All other systems are reviewed and otherwise negative.    EKGs/Labs/Other Studies Reviewed:    Studies reviewed are outlined and summarized above. Reports included below if pertinent.  2017 Echo  - Left ventricle: The cavity size was normal. Wall thickness was  increased in a pattern of moderate LVH. Systolic function was  normal. The estimated ejection fraction was in the range of 60%  to 65%. Wall motion was normal; there were no regional wall  motion abnormalities. Doppler parameters are consistent with  abnormal left ventricular relaxation (grade 1 diastolic  dysfunction).     EKG:  EKG is ordered today, personally reviewed, demonstrating ***  Recent Labs: 07/22/2019: ALT 7; BUN 10; Creatinine, Ser 0.72; Hemoglobin 10.6; NT-Pro BNP 466; Platelets 116; Potassium 3.7; Sodium 140; TSH 2.550  Recent Lipid Panel    Component Value Date/Time   CHOL 147 11/27/2016 1019   TRIG 92 08/22/2017 2056   HDL 65 11/27/2016 1019   CHOLHDL 2.3 11/27/2016 1019   CHOLHDL 2.1 03/08/2016 0447   VLDL 14 03/08/2016 0447   LDLCALC 56 11/27/2016 1019    PHYSICAL EXAM:    VS:  There were no vitals taken for this visit.  BMI: There is no height or weight on file to calculate BMI.  GEN: Well nourished, well developed, in no acute distress HEENT: normocephalic, atraumatic Neck: no JVD, carotid bruits, or masses Cardiac: ***RRR; no murmurs, rubs, or gallops, no edema  Respiratory:  clear to auscultation bilaterally, normal work of breathing GI: soft, nontender, nondistended, + BS MS: no deformity or atrophy Skin: warm and dry, no rash Neuro:  Alert and Oriented x 3, Strength and sensation are intact, follows commands Psych: euthymic mood, full affect  Wt Readings from  Last 3 Encounters:  07/22/19 167 lb (75.8 kg)  05/30/18 158 lb (71.7 kg)  09/06/17 157 lb (71.2 kg)     ASSESSMENT & PLAN:   1. ***  Disposition: F/u with ***  Medication Adjustments/Labs and Tests Ordered: Current medicines are reviewed at length with the patient today.  Concerns regarding medicines are outlined above. Medication changes, Labs and Tests ordered today are summarized above and listed in the Patient Instructions accessible in Encounters.   Signed, Charlie Pitter, PA-C  08/26/2019 9:10 AM    [facility] [address] [place], [place], [place]  [address] Phone: [phone]; Fax: [phone]

## 2019-08-27 ENCOUNTER — Other Ambulatory Visit: Payer: Medicare Other

## 2019-08-27 ENCOUNTER — Ambulatory Visit: Payer: Medicare Other | Admitting: Physician Assistant

## 2019-08-27 DIAGNOSIS — D696 Thrombocytopenia, unspecified: Secondary | ICD-10-CM

## 2019-08-27 DIAGNOSIS — I2510 Atherosclerotic heart disease of native coronary artery without angina pectoris: Secondary | ICD-10-CM

## 2019-08-27 DIAGNOSIS — I10 Essential (primary) hypertension: Secondary | ICD-10-CM

## 2019-08-27 DIAGNOSIS — R600 Localized edema: Secondary | ICD-10-CM

## 2019-08-27 DIAGNOSIS — Z862 Personal history of diseases of the blood and blood-forming organs and certain disorders involving the immune mechanism: Secondary | ICD-10-CM

## 2019-08-29 ENCOUNTER — Telehealth (HOSPITAL_COMMUNITY): Payer: Self-pay | Admitting: Cardiology

## 2019-08-29 NOTE — Telephone Encounter (Signed)
Just an FYI. We have made several attempts to contact this patient including sending a letter to schedule or reschedule their echocardiogram. We will be removing the patient from the echo [place].   08/19/2019 MAILED LETTER [facility]  08/12/2019 Pt No Showed echo      Thank you

## 2019-09-23 ENCOUNTER — Telehealth (HOSPITAL_COMMUNITY): Payer: Self-pay

## 2019-09-23 NOTE — Telephone Encounter (Signed)
Called to reschedule consult, no answer, no vm. AW

## 2019-10-06 ENCOUNTER — Telehealth (HOSPITAL_COMMUNITY): Payer: Self-pay

## 2019-10-06 NOTE — Telephone Encounter (Signed)
Called to schedule consult, no answer, vm not available. AW

## 2020-01-16 ENCOUNTER — Other Ambulatory Visit: Payer: Self-pay | Admitting: Cardiology

## 2020-01-16 DIAGNOSIS — E782 Mixed hyperlipidemia: Secondary | ICD-10-CM

## 2020-01-16 DIAGNOSIS — I119 Hypertensive heart disease without heart failure: Secondary | ICD-10-CM

## 2020-01-16 DIAGNOSIS — I2510 Atherosclerotic heart disease of native coronary artery without angina pectoris: Secondary | ICD-10-CM

## 2020-03-09 ENCOUNTER — Ambulatory Visit
Admission: EM | Admit: 2020-03-09 | Discharge: 2020-03-09 | Disposition: A | Discharge status: Home / Self Care | Payer: Medicare Other | Attending: Emergency Medicine | Admitting: Emergency Medicine

## 2020-03-09 ENCOUNTER — Encounter: Payer: Self-pay | Admitting: Emergency Medicine

## 2020-03-09 ENCOUNTER — Ambulatory Visit (INDEPENDENT_AMBULATORY_CARE_PROVIDER_SITE_OTHER): Payer: Medicare Other

## 2020-03-09 ENCOUNTER — Other Ambulatory Visit: Payer: Self-pay

## 2020-03-09 DIAGNOSIS — M25561 Pain in right knee: Secondary | ICD-10-CM | POA: Diagnosis not present

## 2020-03-09 DIAGNOSIS — M25572 Pain in left ankle and joints of left foot: Secondary | ICD-10-CM

## 2020-03-09 DIAGNOSIS — S82435A Nondisplaced oblique fracture of shaft of left fibula, initial encounter for closed fracture: Secondary | ICD-10-CM

## 2020-03-09 NOTE — ED Triage Notes (Signed)
Pt with trip and fall today; pt c/o left ankle pain and right knee pain from fall; bilateral legs swelling noted per norm

## 2020-03-09 NOTE — Discharge Instructions (Signed)
Please go to [facility] tomorrow at 830 Tylenol [phone] mg every 4-6 hours as needed for pain Wear boot until following up with ortho

## 2020-03-10 NOTE — ED Provider Notes (Signed)
[facility]    CSN: [id number] Arrival date & time: 03/09/20  1536      History   Chief Complaint Chief Complaint  Patient presents with  . Fall    HPI Jeanne Lawson is a 75 y.o. female history of CAD, COPD, hypertension, prior strokes presenting today for evaluation of left ankle pain and knee pain after a fall.  Patient tripped over a rug earlier today.  Since she has had pain with any weightbearing throughout her left ankle.  She notes that she is swelling normally to lower legs.  Also complaining of right knee pain, but reports that she is able to ambulate relatively normally on right side.  Typically will use cane as she has had some deficits and weakness from prior strokes.  On 81 mg aspirin daily.  Denies hitting head or loss of consciousness and fall.  HPI  Past Medical History:  Diagnosis Date  . Cancer (HCC)    right leg skin   . COPD (chronic obstructive pulmonary disease) ([place])   . Coronary artery disease 1996   Known with prior mild lesion of LAD demonstrated by Cardiac Catheterization in 1996  . Edema of foot    She has a history of chronic edema of the left dated back to age 57 when she suufered severe frostbite playing  on the snow as a child  . Hypertension   . Lower extremity edema   . PAC (premature atrial contraction)   . Pancreatitis    x2  . PONV (postoperative nausea and vomiting)    problems waking up last time 4/16 only time  . 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  . Stroke ([place]) 04/2014   She had had a previous thrombotic stroke  involving the right corona radiata in October 2010; left arm and leg weakness  . TIA (transient ischemic attack)    She was hospitalized 04-23-09 through 04-27-09 for involving right side of the body  . Tobacco abuse    Ongoing   . Ventricular hypertrophy 04/2009   LVH with diastolic dysfunction by echo. Has normal EF.     Patient Active Problem List   Diagnosis Date Noted  . Acute cholecystitis 03/07/2016  . 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 Aneurysm, cerebral, nonruptured 05/31/2015  . Dizziness and giddiness 05/31/2015  . Altered mental status   . Acute CVA (cerebrovascular accident) ([place]) 03/15/2015  . Dysphasia 03/12/2015  . Thrombocytopenia ([place]) 03/12/2015  . Hypokalemia   . Endotracheally intubated   . Essential hypertension   . Intracranial aneurysm 11/18/2014  . Respiratory failure ([place])   . Cerebral aneurysm   . Brain aneurysm   . Aphasia   . Palmar erythema   . CVA (cerebral infarction) 05/02/2014  . Aneurysm ([place]) 05/02/2014  . Expressive aphasia 05/02/2014  . 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 Tobacco abuse 10/24/2010  . TIA (transient ischemic attack)   . Hypertension   . Ventricular hypertrophy   . CVA 02/23/2010  . STRESS FRACTURE, FOOT 02/22/2010    Past Surgical History:  Procedure Laterality Date  . ABDOMINAL HYSTERECTOMY    . APPENDECTOMY    . CARDIAC CATHETERIZATION  1996   Mild CAD with vasospasm  . CARDIOVASCULAR STRESS TEST  12-02-2001   EF 70%  . CHOLECYSTECTOMY N/A 03/09/2016   Procedure: LAPAROSCOPIC CHOLECYSTECTOMY WITH INTRAOPERATIVE  CHOLANGIOGRAM;  Surgeon: Georganna Skeans, MD;  Location: [facility];  Service: General;  Laterality: N/A;  . ENDARTERECTOMY Right 08/12/2014   Procedure: RIGHT  COMMON CAROTID ARTERY EXPOSURE FOR INTERVENTIONAL RADIOLOGY PROCEDURE BY DR.DEVASHWAR,Insertion 6 FR sheath;  Surgeon: Elam Dutch, MD;  Location: [facility];  Service: Vascular;  Laterality: Right;  . ENDARTERECTOMY Right 08/12/2014   Procedure:  CAROTID  EXPOSURE CLOSURE RIGHT NECK, REPAIR RIGHT COMMON CAROTID ARTERY;  Surgeon: Elam Dutch, MD;  Location: [facility];  Service: Vascular;  Laterality: Right;  . ENDARTERECTOMY Left 11/18/2014   Procedure: CAROTID EXPOSURE;  Surgeon: Elam Dutch, MD;  Location:  [facility];  Service: Vascular;  Laterality: Left;  . ENDARTERECTOMY Left 11/18/2014   Procedure: CLOSURE CAROTID;  Surgeon: Elam Dutch, MD;  Location: [facility];  Service: Vascular;  Laterality: Left;  . ENDARTERECTOMY Right 08/22/2017   Procedure: EXPOSURE CAROTID ARTERY FOR [place] SURGERY;  Surgeon: Elam Dutch, MD;  Location: [facility] OR;  Service: Vascular;  Laterality: Right;  . IR ANGIO INTRA EXTRACRAN SEL INTERNAL CAROTID UNI R MOD SED  08/22/2017  . IR ANGIOGRAM FOLLOW UP STUDY  08/22/2017  . IR RADIOLOGIST EVAL & MGMT  06/26/2016  . IR RADIOLOGIST EVAL & MGMT  05/08/2017  . IR RADIOLOGY PERIPHERAL GUIDED IV START  12/03/2017  . IR TRANSCATH/EMBOLIZ  08/22/2017  . IR US GUIDE VASC ACCESS RIGHT  12/03/2017  . LAPAROSCOPY    . NECK SURGERY  50 yrs ago   Left side tumor  . RADIOLOGY WITH ANESTHESIA N/A 05/25/2014   Procedure: RADIOLOGY WITH ANESTHESIA;  Surgeon: [name], MD;  Location: [facility];  Service: Radiology;  Laterality: N/A;  . RADIOLOGY WITH ANESTHESIA N/A 08/12/2014   Procedure: RADIOLOGY WITH ANESTHESIA;  Surgeon: [name], MD;  Location: [facility];  Service: Radiology;  Laterality: N/A;  . RADIOLOGY WITH ANESTHESIA N/A 03/15/2015   Procedure: MRI OF BRAIN WITHOUT CONTRAST;  Surgeon: Medication Radiologist, MD;  Location: [facility];  Service: Radiology;  Laterality: N/A;  DR. TAT/MRI  . US ECHOCARDIOGRAPHY  04-26-2009   EF 65-70%  . VESICOVAGINAL FISTULA CLOSURE W/ TAH  25 yrs ago  . WOUND EXPLORATION Right 08/22/2017   Procedure: REMOVAL OF RIGHT COMMON CAROTID SHEATH AND WOUND CLOSURE;  Surgeon: Rosetta Posner, MD;  Location[facility];  Service: Vascular;  Laterality: Right;    OB History   No obstetric history on file.      Home Medications    Prior to Admission medications   Medication Sig Start Date End Date Taking? Authorizing Provider  albuterol (PROVENTIL HFA;VENTOLIN HFA) 108 (90 BASE) MCG/ACT inhaler Inhale 1-2 puffs into the lungs every 6 (six) hours as needed  for wheezing or shortness of breath.    [provider]  aspirin EC 81 MG tablet Take 81 mg by mouth daily with lunch.     [provider]  furosemide (LASIX) 40 MG tablet Take 1 tablet (40 mg total) by mouth daily. 07/22/19   Dorothy Spark, MD  spironolactone (ALDACTONE) 25 MG tablet Take 0.5 tablets (12.5 mg total) by mouth daily. 07/22/19   Dorothy Spark, MD  TOPROL XL 50 MG 24 hr tablet TAKE 1/2 TABLET BY MOUTH TWICE DAILY. TAKE WITH OR IMMEDIATELY FOLLOWING A MEAL. 01/19/20   Dorothy Spark, MD    Family History Family History  Problem Relation Age of Onset  . Emphysema Father   . Diabetes Father   . Aneurysm Sister   .  Stroke Mother   . Hypertension Mother   . Heart disease Mother   . Hypertension Daughter   . Thyroid disease Daughter     Social History Social History   Tobacco Use  . Smoking status: Former Smoker    Packs/day: 1.00    Years: 30.00    Pack years: 30.00    Types: Cigarettes    Quit date: 04/26/2014    Years since quitting: 5.8  . Smokeless tobacco: Never Used  Vaping Use  . Vaping Use: Never used  Substance Use Topics  . Alcohol use: No  . Drug use: No     Allergies   Dilaudid [hydromorphone], Latex, Lidocaine, Sulfa drugs cross reactors, and Codeine   Review of Systems Review of Systems  Constitutional: Negative for activity change, chills, diaphoresis and fatigue.  HENT: Negative for ear pain, tinnitus and trouble swallowing.   Eyes: Negative for photophobia and visual disturbance.  Respiratory: Negative for cough, chest tightness and shortness of breath.   Cardiovascular: Negative for chest pain and leg swelling.  Gastrointestinal: Negative for abdominal pain, blood in stool, nausea and vomiting.  Musculoskeletal: Positive for arthralgias, gait problem and joint swelling. Negative for back pain, myalgias, neck pain and neck stiffness.  Skin: Negative for color change and wound.  Neurological: Negative for  dizziness, weakness, light-headedness, numbness and headaches.     Physical Exam Triage Vital Signs ED Triage Vitals [03/09/20 1759]  Enc Vitals Group     BP (!) 171/81     Pulse Rate 69     Resp 18     Temp 98.1 F (36.7 C)     Temp Source Oral     SpO2 96 %     Weight      Height      Head Circumference      Peak Flow      Pain Score 8     Pain Loc      Pain Edu?      Excl. in [place]?    No data found.  Updated Vital Signs BP (!) 171/81 (BP Location: Right Arm)   Pulse 69   Temp 98.1 F (36.7 C) (Oral)   Resp 18   SpO2 96%   Visual Acuity Right Eye Distance:   Left Eye Distance:   Bilateral Distance:    Right Eye Near:   Left Eye Near:    Bilateral Near:     Physical Exam Vitals and nursing note reviewed.  Constitutional:      Appearance: She is well-developed and well-nourished.     Comments: No acute distress  HENT:     Head: Normocephalic and atraumatic.     Nose: Nose normal.  Eyes:     Conjunctiva/sclera: Conjunctivae normal.  Cardiovascular:     Rate and Rhythm: Normal rate.  Pulmonary:     Effort: Pulmonary effort is normal. No respiratory distress.  Abdominal:     General: There is no distension.  Musculoskeletal:        General: Normal range of motion.     Cervical back: Neck supple.     Comments: Left ankle/foot: Significant swelling and prominent varicosities present, tenderness to palpation to lateral malleolus extending slightly anteriorly, nontender throughout dorsum of foot, sensation intact distally  Right knee: Tenderness to palpation to infrapatellar area, nontender directly over patella or suprapatellar aspect  Skin:    General: Skin is warm and dry.  Neurological:     Mental Status: She is alert and  oriented to person, place, and time.  Psychiatric:        Mood and Affect: Mood and affect normal.      UC Treatments / Results  Labs (all labs ordered are listed, but only abnormal results are displayed) Labs Reviewed - No  data to display  EKG   Radiology DG Ankle Complete Left  Result Date: 03/09/2020 CLINICAL DATA:  [place]. EXAM: LEFT ANKLE COMPLETE - 3+ VIEW COMPARISON:  None. FINDINGS: Oblique nondisplaced distal fibular fracture at and just above the ankle mortise. No additional fracture of the ankle. There is a small ankle joint effusion. The ankle mortise is preserved. Diffuse soft tissue prominence likely related to habitus. IMPRESSION: Oblique nondisplaced left distal fibular fracture at and just above the ankle mortise. Electronically Signed   By: Keith Rake M.D.   On: 03/09/2020 19:02   DG Knee Complete 4 Views Right  Result Date: 03/09/2020 CLINICAL DATA:  [place] EXAM: RIGHT KNEE - COMPLETE 4+ VIEW COMPARISON:  None. FINDINGS: Frontal, bilateral oblique, and lateral views of the right knee are obtained. The bones are diffusely osteopenic. There is an oblique lucency through the superolateral margin of the patella, suspicious for nondisplaced fracture. Please correlate with site of patient's pain. No other acute bony abnormalities. Joint spaces are well preserved. No significant joint effusion. IMPRESSION: 1. Nondisplaced fracture through the superolateral margin of the patella. 2. Severe osteopenia. Electronically Signed   By: Randa Ngo M.D.   On: 03/09/2020 19:02    Procedures Procedures (including critical care time)  Medications Ordered in UC Medications - No data to display  Initial Impression / Assessment and Plan / UC Course  I have reviewed the triage vital signs and the nursing notes.  Pertinent labs & imaging results that were available during my care of the patient were reviewed by me and considered in my medical decision making (see chart for details).     Left distal fibular fracture above ankle mortise, possible superior lateral patellar fracture-discussed with Dr. Percell Miller on-call given patient's age, recommended boot to left ankle and follow-up at 41 tomorrow morning at his  office.  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  Family with patient who expressed understanding on recommendations to follow-up with orthopedics at 830 tomorrow morning.  Discussed strict return precautions. Patient verbalized understanding and is agreeable with plan.  Final Clinical Impressions(s) / UC Diagnoses   Final diagnoses:  Nondisplaced oblique fracture of shaft of left fibula, initial encounter for closed fracture     Discharge Instructions     Please go to [facility] tomorrow at 830 Tylenol [phone] mg every 4-6 hours as needed for pain Wear boot until following up with ortho    ED Prescriptions    None     PDMP not reviewed this encounter.   Janith Lima, [place] 03/10/20 1115

## 2020-04-28 ENCOUNTER — Telehealth: Payer: Self-pay | Admitting: Cardiology

## 2020-04-28 DIAGNOSIS — I10 Essential (primary) hypertension: Secondary | ICD-10-CM

## 2020-04-28 DIAGNOSIS — I2510 Atherosclerotic heart disease of native coronary artery without angina pectoris: Secondary | ICD-10-CM

## 2020-04-28 DIAGNOSIS — I890 Lymphedema, not elsewhere classified: Secondary | ICD-10-CM

## 2020-04-28 DIAGNOSIS — I5033 Acute on chronic diastolic (congestive) heart failure: Secondary | ICD-10-CM

## 2020-04-28 MED ORDER — FUROSEMIDE 40 MG PO TABS: 40.0000 mg | ORAL_TABLET | Freq: Every day | ORAL | 2 refills | Status: DC

## 2020-04-28 NOTE — Telephone Encounter (Signed)
Returned call to patient regarding lower extremity edema. She states she broke her left leg and had her cast removed on Friday. .She has bilateral lower extremity swelling, left worse than right since Friday.  Advised ortho MD told her furosemide may help and it is listed on her medication list as being taken daily.  States she only took it for lower extremity swelling last June and edema completely resolved, no problems since that time.  Reports she also has redness to skin of left leg where the cast rubbed her skin raw and MD recommended antibiotic. I advised we will defer treatment to him on that. She resumed 40 mg furosemide on Sunday. She denies weight gain or SOB. She was scheduled for repeat echo last June 2021. She states she completed that test but there is no record of it in epic. Asked if she is taking spironolactone which she denies, states she takes lisinopril. She has 12 furosemide 40 mg tablets left. I advised that I will send a short-term refill and that we will need to have her come by for bmet. She has an appointment next door on Monday 4/11 so will get her to come here for lab. She has not been seen since June 2021; scheduled her to see Dr. Johney Frame on 7/15, first available. Advised

## 2020-04-28 NOTE — Telephone Encounter (Signed)
    Pt c/o swelling: STAT is pt has developed SOB within 24 hours  1) How much weight have you gained and in what time span?   2) If swelling, where is the swelling located? Both   3) Are you currently taking a fluid pill? Yes  4) Are you currently SOB? No  5) Do you have a log of your daily weights (if so, list)?   6) Have you gained 3 pounds in a day or 5 pounds in a week?   7) Have you traveled recently? No   Pt said 8 weeks ago she broke his left foot, been taking lasix but the last couple of days both of her legs is swollen, lasix is no longer working. She wanted to call her heart doctor for any recommendations

## 2020-05-03 ENCOUNTER — Other Ambulatory Visit: Payer: Self-pay

## 2020-05-03 ENCOUNTER — Other Ambulatory Visit: Payer: Medicare Other | Admitting: *Deleted

## 2020-05-03 DIAGNOSIS — I10 Essential (primary) hypertension: Secondary | ICD-10-CM

## 2020-05-03 DIAGNOSIS — I890 Lymphedema, not elsewhere classified: Secondary | ICD-10-CM

## 2020-05-03 DIAGNOSIS — I2510 Atherosclerotic heart disease of native coronary artery without angina pectoris: Secondary | ICD-10-CM

## 2020-05-03 DIAGNOSIS — I5033 Acute on chronic diastolic (congestive) heart failure: Secondary | ICD-10-CM

## 2020-05-03 LAB — BASIC METABOLIC PANEL
BUN/Creatinine Ratio: 18 (ref 12–28)
BUN: 15 mg/dL (ref 8–27)
CO2: 27 mmol/L (ref 20–29)
Calcium: 8.9 mg/dL (ref 8.7–10.3)
Chloride: 98 mmol/L (ref 96–106)
Creatinine, Ser: 0.85 mg/dL (ref 0.57–1.00)
Glucose: 92 mg/dL (ref 65–99)
Potassium: 3.3 mmol/L — ABNORMAL LOW (ref 3.5–5.2)
Sodium: 141 mmol/L (ref 134–144)
eGFR: 71 mL/min/{1.73_m2} (ref >59)

## 2020-05-04 ENCOUNTER — Telehealth: Payer: Self-pay | Admitting: *Deleted

## 2020-05-04 DIAGNOSIS — E876 Hypokalemia: Secondary | ICD-10-CM

## 2020-05-04 DIAGNOSIS — Z79899 Other long term (current) drug therapy: Secondary | ICD-10-CM

## 2020-05-04 MED ORDER — POTASSIUM CHLORIDE CRYS ER 20 MEQ PO TBCR: 40.0000 meq | EXTENDED_RELEASE_TABLET | Freq: Every day | ORAL | 3 refills | Status: DC

## 2020-05-04 NOTE — Telephone Encounter (Signed)
-----   Message from Freada Bergeron, MD sent at 05/04/2020  8:08 AM EDT ----- Totally agree. She needs potassium supplementation. Can we start her on potassium 26mEq daily to continue while she is taking the lasix. We should repeat labs in 7-10days to make sure the potassium improves.

## 2020-05-04 NOTE — Telephone Encounter (Signed)
Pt aware of lab results and recommendations per Dr. Johney Frame, for her to start taking KDUR 40 mEq po daily while she is taking her lasix daily, and come in for repeat BMET in 7-10 days. Confirmed the pharmacy of choice with the pt.  Scheduled the pt to come in for repeat BMET on 05/12/20.  Pt verbalized understanding and agrees with this plan.

## 2020-05-11 ENCOUNTER — Other Ambulatory Visit: Payer: Self-pay

## 2020-05-11 ENCOUNTER — Other Ambulatory Visit: Payer: Medicare Other | Admitting: *Deleted

## 2020-05-11 DIAGNOSIS — E876 Hypokalemia: Secondary | ICD-10-CM

## 2020-05-11 DIAGNOSIS — Z79899 Other long term (current) drug therapy: Secondary | ICD-10-CM

## 2020-05-11 LAB — BASIC METABOLIC PANEL
BUN/Creatinine Ratio: 16 (ref 12–28)
BUN: 14 mg/dL (ref 8–27)
CO2: 25 mmol/L (ref 20–29)
Calcium: 8.9 mg/dL (ref 8.7–10.3)
Chloride: 100 mmol/L (ref 96–106)
Creatinine, Ser: 0.9 mg/dL (ref 0.57–1.00)
Glucose: 82 mg/dL (ref 65–99)
Potassium: 4.3 mmol/L (ref 3.5–5.2)
Sodium: 140 mmol/L (ref 134–144)
eGFR: 66 mL/min/{1.73_m2} (ref >59)

## 2020-05-12 ENCOUNTER — Other Ambulatory Visit: Payer: Medicare Other

## 2020-05-24 ENCOUNTER — Ambulatory Visit (INDEPENDENT_AMBULATORY_CARE_PROVIDER_SITE_OTHER): Payer: Medicare Other | Admitting: Physician Assistant

## 2020-05-24 ENCOUNTER — Encounter: Payer: Self-pay | Admitting: Orthopedic Surgery

## 2020-05-24 DIAGNOSIS — I872 Venous insufficiency (chronic) (peripheral): Secondary | ICD-10-CM | POA: Diagnosis not present

## 2020-05-24 NOTE — Progress Notes (Signed)
Office Visit Note   Patient: Jeanne Lawson           Date of Birth: 1945/05/03           MRN: [id number] Visit Date: 05/24/2020              Requested by: [name], MD [address] [place],  [place] [address] PCP: [name], MD  Chief Complaint  Patient presents with  . Left Leg - Edema, Fracture  . Right Leg - Edema      HPI: Patient is a 75 year old woman who is seen for initial evaluation and referral from Dr. Mardelle Matte.  Patient had a nondisplaced Weber B fibular fracture on the left treated with good conservative care her last radiographs were reviewed which shows congruent mortise with no displacement of the fibula.  Patient has had chronic venous insufficiency of both legs she states she takes Lasix 40 mg a day and potassium 40 mg a day.  Patient has had compression wraps in the past with home health nursing but she states that 1 time she had a bad skin reaction and has stopped the compression.  Assessment & Plan: Visit Diagnoses:  1. Edema of both lower extremities due to peripheral venous insufficiency     Plan: We will apply a 3 layer Dynaflex compression wrap plus postoperative shoes follow-up Friday to change the compression wraps.  We will evaluate the skin patient may need a Unna paste against the skin depending on how the skin reacts to compression.  Follow-Up Instructions: Return in about 1 week (around 05/31/2020) for Follow-up this Friday to replace compression wraps.   Ortho Exam  Patient is alert, oriented, no adenopathy, well-dressed, normal affect, normal respiratory effort. Examination patient has massive swelling of both legs and both feet there are no open ulcers no cellulitis.  There is pitting edema up to the tibial tubercle.  Her x-rays were reviewed which shows a congruent mortise with a nondisplaced Weber B fibular fracture of the fibula.  Imaging: No results found. No images are attached to the encounter.  Labs: Lab Results   Component Value Date   HGBA1C 5.1 03/08/2016   HGBA1C 5.5 03/13/2015   HGBA1C 5.6 05/03/2014   REPTSTATUS 06/14/2017 FINAL 06/13/2017   CULT  06/13/2017    NO MRSA DETECTED Performed at [facility], [place] [address], [place], [place] [address]      Lab Results  Component Value Date   ALBUMIN 4.0 07/22/2019   ALBUMIN 2.7 (L) 08/23/2017   ALBUMIN 2.5 (L) 08/22/2017    Lab Results  Component Value Date   MG 2.2 08/24/2017   MG 1.8 08/23/2017   MG 1.7 08/22/2017   No results found for: VD25OH  No results found for: PREALBUMIN CBC EXTENDED Latest Ref Rng & Units 07/22/2019 08/24/2017 08/23/2017  WBC 3.4 - 10.8 x10E3/uL 3.8 4.9 5.5  RBC 3.77 - 5.28 x10E6/uL 3.93 2.90(L) 3.09(L)  HGB 11.1 - 15.9 g/dL 10.6(L) 7.9(L) 8.6(L)  HCT 34.0 - 46.6 % 32.2(L) 25.9(L) 26.9(L)  PLT 150 - 450 x10E3/uL 116(L) 116(L) 107(L)  NEUTROABS 1.7 - 7.7 K/uL - 3.3 -  LYMPHSABS 0.7 - 4.0 K/uL - 1.1 -     There is no height or weight on file to calculate BMI.  Orders:  No orders of the defined types were placed in this encounter.  No orders of the defined types were placed in this encounter.    Procedures: No procedures performed  Clinical Data:  No additional findings.  ROS:  All other systems negative, except as noted in the HPI. Review of Systems  Objective: Vital Signs: There were no vitals taken for this visit.  Specialty Comments:  No specialty comments available.  PMFS History: Patient Active Problem List   Diagnosis Date Noted  . Acute cholecystitis 03/07/2016  . 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 Aneurysm, cerebral, nonruptured 05/31/2015  . Dizziness and giddiness 05/31/2015  . Altered mental status   . Acute CVA (cerebrovascular accident) ([place]) 03/15/2015  . Dysphasia 03/12/2015  . Thrombocytopenia ([place]) 03/12/2015  . Hypokalemia   . Endotracheally intubated   . Essential hypertension   . Intracranial aneurysm 11/18/2014  . Respiratory failure  ([place])   . Cerebral aneurysm   . Brain aneurysm   . Aphasia   . Palmar erythema   . CVA (cerebral infarction) 05/02/2014  . Aneurysm ([place]) 05/02/2014  . Expressive aphasia 05/02/2014  . 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 Tobacco abuse 10/24/2010  . TIA (transient ischemic attack)   . Hypertension   . Ventricular hypertrophy   . CVA 02/23/2010  . STRESS FRACTURE, FOOT 02/22/2010   Past Medical History:  Diagnosis Date  . Cancer (HCC)    right leg skin   . COPD (chronic obstructive pulmonary disease) ([place])   . Coronary artery disease 1996   Known with prior mild lesion of LAD demonstrated by Cardiac Catheterization in 1996  . Edema of foot    She has a history of chronic edema of the left dated back to age 30 when she suufered severe frostbite playing  on the snow as a child  . Hypertension   . Lower extremity edema   . PAC (premature atrial contraction)   . Pancreatitis    x2  . PONV (postoperative nausea and vomiting)    problems waking up last time 4/16 only time  . 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  . Stroke ([place]) 04/2014   She had had a previous thrombotic stroke  involving the right corona radiata in October 2010; left arm and leg weakness  . TIA (transient ischemic attack)    She was hospitalized 04-23-09 through 04-27-09 for involving right side of the body  . Tobacco abuse    Ongoing   . Ventricular hypertrophy 04/2009   LVH with diastolic dysfunction by echo. Has normal EF.    Family History  Problem Relation Age of Onset  . Emphysema Father   . Diabetes Father   . Aneurysm Sister   . Stroke Mother   . Hypertension Mother   . Heart disease Mother   . Hypertension Daughter   . Thyroid disease Daughter     Past Surgical History:  Procedure Laterality Date  . ABDOMINAL HYSTERECTOMY    . APPENDECTOMY    . CARDIAC CATHETERIZATION  1996    Mild CAD with vasospasm  . CARDIOVASCULAR STRESS TEST  12-02-2001   EF 70%  . CHOLECYSTECTOMY N/A 03/09/2016   Procedure: LAPAROSCOPIC CHOLECYSTECTOMY WITH INTRAOPERATIVE CHOLANGIOGRAM;  Surgeon: Georganna Skeans, MD;  Location: [facility];  Service: General;  Laterality: N/A;  . ENDARTERECTOMY Right 08/12/2014   Procedure: RIGHT  COMMON CAROTID ARTERY EXPOSURE FOR INTERVENTIONAL RADIOLOGY PROCEDURE BY DR.DEVASHWAR,Insertion 6 FR sheath;  Surgeon: Elam Dutch, MD;  Location: [facility];  Service: Vascular;  Laterality: Right;  . ENDARTERECTOMY Right  08/12/2014   Procedure:  CAROTID  EXPOSURE CLOSURE RIGHT NECK, REPAIR RIGHT COMMON CAROTID ARTERY;  Surgeon: Elam Dutch, MD;  Location: [facility];  Service: Vascular;  Laterality: Right;  . ENDARTERECTOMY Left 11/18/2014   Procedure: CAROTID EXPOSURE;  Surgeon: Elam Dutch, MD;  Location: [facility];  Service: Vascular;  Laterality: Left;  . ENDARTERECTOMY Left 11/18/2014   Procedure: CLOSURE CAROTID;  Surgeon: Elam Dutch, MD;  Location: [facility];  Service: Vascular;  Laterality: Left;  . ENDARTERECTOMY Right 08/22/2017   Procedure: EXPOSURE CAROTID ARTERY FOR [place] SURGERY;  Surgeon: Elam Dutch, MD;  Location: [facility] OR;  Service: Vascular;  Laterality: Right;  . IR ANGIO INTRA EXTRACRAN SEL INTERNAL CAROTID UNI R MOD SED  08/22/2017  . IR ANGIOGRAM FOLLOW UP STUDY  08/22/2017  . IR RADIOLOGIST EVAL & MGMT  06/26/2016  . IR RADIOLOGIST EVAL & MGMT  05/08/2017  . IR RADIOLOGY PERIPHERAL GUIDED IV START  12/03/2017  . IR TRANSCATH/EMBOLIZ  08/22/2017  . IR US GUIDE VASC ACCESS RIGHT  12/03/2017  . LAPAROSCOPY    . NECK SURGERY  50 yrs ago   Left side tumor  . RADIOLOGY WITH ANESTHESIA N/A 05/25/2014   Procedure: RADIOLOGY WITH ANESTHESIA;  Surgeon: [name], MD;  Location: [facility];  Service: Radiology;  Laterality: N/A;  . RADIOLOGY WITH ANESTHESIA N/A 08/12/2014   Procedure: RADIOLOGY WITH ANESTHESIA;  Surgeon: [name], MD;   Location: [facility] [place];  Service: Radiology;  Laterality: N/A;  . RADIOLOGY WITH ANESTHESIA N/A 03/15/2015   Procedure: MRI OF BRAIN WITHOUT CONTRAST;  Surgeon: Medication Radiologist, MD;  Location: [facility];  Service: Radiology;  Laterality: N/A;  DR. TAT/MRI  . US ECHOCARDIOGRAPHY  04-26-2009   EF 65-70%  . VESICOVAGINAL FISTULA CLOSURE W/ TAH  25 yrs ago  . WOUND EXPLORATION Right 08/22/2017   Procedure: REMOVAL OF RIGHT COMMON CAROTID SHEATH AND WOUND CLOSURE;  Surgeon: Rosetta Posner, MD;  [facility];  Service: Vascular;  Laterality: Right;   Social History   Occupational History  . Not on file  Tobacco Use  . Smoking status: Former Smoker    Packs/day: 1.00    Years: 30.00    Pack years: 30.00    Types: Cigarettes    Quit date: 04/26/2014    Years since quitting: 6.0  . Smokeless tobacco: Never Used  Vaping Use  . Vaping Use: Never used  Substance and Sexual Activity  . Alcohol use: No  . Drug use: No  . Sexual activity: Not on file

## 2020-05-28 ENCOUNTER — Ambulatory Visit: Payer: Medicare Other | Admitting: Family

## 2020-05-31 ENCOUNTER — Encounter: Payer: Self-pay | Admitting: Orthopedic Surgery

## 2020-05-31 ENCOUNTER — Ambulatory Visit (INDEPENDENT_AMBULATORY_CARE_PROVIDER_SITE_OTHER): Payer: Medicare Other | Admitting: Physician Assistant

## 2020-05-31 DIAGNOSIS — I872 Venous insufficiency (chronic) (peripheral): Secondary | ICD-10-CM

## 2020-05-31 NOTE — Progress Notes (Signed)
Office Visit Note   Patient: Jeanne Lawson           Date of Birth: 1946-01-21           MRN: [id number] Visit Date: 05/31/2020              Requested by: [name], MD [address] [place],  [place] [address] PCP: [name], MD  Chief Complaint  Patient presents with  . Left Leg - Follow-up  . Right Leg - Follow-up      HPI: Patient presents in follow-up today for bilateral Pro[place] dressings status post venous insufficiency and left fibula fracture.  Unfortunately the wraps did roll halfway down her legs.  Assessment & Plan: Visit Diagnoses: No diagnosis found.  Plan: We will try an Unna paste beneath the wraps.  She did have a skin reaction to something that was sprayed on when home health was doing this.  If she has any itching or concerns we will take the wraps off when she did needs this to.  It does not sound like it is the same material.  She will follow-up in 1 week  Follow-Up Instructions: No follow-ups on file.   Ortho Exam  Patient is alert, oriented, no adenopathy, well-dressed, normal affect, normal respiratory effort. Bilateral lower extremity where she has had the wrap she has had significant wrinkling in the skin.  Above that she still has significant swelling.  No open areas of ulcers.  No ascending cellulitis or signs of infection  Imaging: No results found. No images are attached to the encounter.  Labs: Lab Results  Component Value Date   HGBA1C 5.1 03/08/2016   HGBA1C 5.5 03/13/2015   HGBA1C 5.6 05/03/2014   REPTSTATUS 06/14/2017 FINAL 06/13/2017   CULT  06/13/2017    NO MRSA DETECTED Performed at [facility], [place] [address], [place], [place] [address]      Lab Results  Component Value Date   ALBUMIN 4.0 07/22/2019   ALBUMIN 2.7 (L) 08/23/2017   ALBUMIN 2.5 (L) 08/22/2017    Lab Results  Component Value Date   MG 2.2 08/24/2017   MG 1.8 08/23/2017   MG 1.7 08/22/2017   No results found for: VD25OH  No  results found for: PREALBUMIN CBC EXTENDED Latest Ref Rng & Units 07/22/2019 08/24/2017 08/23/2017  WBC 3.4 - 10.8 x10E3/uL 3.8 4.9 5.5  RBC 3.77 - 5.28 x10E6/uL 3.93 2.90(L) 3.09(L)  HGB 11.1 - 15.9 g/dL 10.6(L) 7.9(L) 8.6(L)  HCT 34.0 - 46.6 % 32.2(L) 25.9(L) 26.9(L)  PLT 150 - 450 x10E3/uL 116(L) 116(L) 107(L)  NEUTROABS 1.7 - 7.7 K/uL - 3.3 -  LYMPHSABS 0.7 - 4.0 K/uL - 1.1 -     There is no height or weight on file to calculate BMI.  Orders:  No orders of the defined types were placed in this encounter.  No orders of the defined types were placed in this encounter.    Procedures: No procedures performed  Clinical Data: No additional findings.  ROS:  All other systems negative, except as noted in the HPI. Review of Systems  Objective: Vital Signs: There were no vitals taken for this visit.  Specialty Comments:  No specialty comments available.  PMFS History: Patient Active Problem List   Diagnosis Date Noted  . Acute cholecystitis 03/07/2016  . 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 Aneurysm, cerebral, nonruptured 05/31/2015  . Dizziness and giddiness 05/31/2015  . Altered mental status   . Acute CVA (  cerebrovascular accident) ([place]) 03/15/2015  . Dysphasia 03/12/2015  . Thrombocytopenia ([place]) 03/12/2015  . Hypokalemia   . Endotracheally intubated   . Essential hypertension   . Intracranial aneurysm 11/18/2014  . Respiratory failure ([place])   . Cerebral aneurysm   . Brain aneurysm   . Aphasia   . Palmar erythema   . CVA (cerebral infarction) 05/02/2014  . Aneurysm ([place]) 05/02/2014  . Expressive aphasia 05/02/2014  . 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 Tobacco abuse 10/24/2010  . TIA (transient ischemic attack)   . Hypertension   . Ventricular hypertrophy   . CVA 02/23/2010  . STRESS FRACTURE, FOOT 02/22/2010   Past Medical History:  Diagnosis Date  . Cancer (HCC)    right leg skin   . COPD  (chronic obstructive pulmonary disease) ([place])   . Coronary artery disease 1996   Known with prior mild lesion of LAD demonstrated by Cardiac Catheterization in 1996  . Edema of foot    She has a history of chronic edema of the left dated back to age 56 when she suufered severe frostbite playing  on the snow as a child  . Hypertension   . Lower extremity edema   . PAC (premature atrial contraction)   . Pancreatitis    x2  . PONV (postoperative nausea and vomiting)    problems waking up last time 4/16 only time  . 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  . Stroke ([place]) 04/2014   She had had a previous thrombotic stroke  involving the right corona radiata in October 2010; left arm and leg weakness  . TIA (transient ischemic attack)    She was hospitalized 04-23-09 through 04-27-09 for involving right side of the body  . Tobacco abuse    Ongoing   . Ventricular hypertrophy 04/2009   LVH with diastolic dysfunction by echo. Has normal EF.    Family History  Problem Relation Age of Onset  . Emphysema Father   . Diabetes Father   . Aneurysm Sister   . Stroke Mother   . Hypertension Mother   . Heart disease Mother   . Hypertension Daughter   . Thyroid disease Daughter     Past Surgical History:  Procedure Laterality Date  . ABDOMINAL HYSTERECTOMY    . APPENDECTOMY    . CARDIAC CATHETERIZATION  1996   Mild CAD with vasospasm  . CARDIOVASCULAR STRESS TEST  12-02-2001   EF 70%  . CHOLECYSTECTOMY N/A 03/09/2016   Procedure: LAPAROSCOPIC CHOLECYSTECTOMY WITH INTRAOPERATIVE CHOLANGIOGRAM;  Surgeon: Georganna Skeans, MD;  Location: [facility];  Service: General;  Laterality: N/A;  . ENDARTERECTOMY Right 08/12/2014   Procedure: RIGHT  COMMON CAROTID ARTERY EXPOSURE FOR INTERVENTIONAL RADIOLOGY PROCEDURE BY DR.DEVASHWAR,Insertion 6 FR sheath;  Surgeon: Elam Dutch, MD;  Location: [facility];  Service: Vascular;  Laterality:  Right;  . ENDARTERECTOMY Right 08/12/2014   Procedure:  CAROTID  EXPOSURE CLOSURE RIGHT NECK, REPAIR RIGHT COMMON CAROTID ARTERY;  Surgeon: Elam Dutch, MD;  Location: [facility];  Service: Vascular;  Laterality: Right;  . ENDARTERECTOMY Left 11/18/2014   Procedure: CAROTID EXPOSURE;  Surgeon: Elam Dutch, MD;  Location: [facility];  Service: Vascular;  Laterality: Left;  . ENDARTERECTOMY Left 11/18/2014   Procedure: CLOSURE CAROTID;  Surgeon: Elam Dutch, MD;  Location: [facility];  Service: Vascular;  Laterality: Left;  .  ENDARTERECTOMY Right 08/22/2017   Procedure: EXPOSURE CAROTID ARTERY FOR [place] SURGERY;  Surgeon: Elam Dutch, MD;  Location: [facility] OR;  Service: Vascular;  Laterality: Right;  . IR ANGIO INTRA EXTRACRAN SEL INTERNAL CAROTID UNI R MOD SED  08/22/2017  . IR ANGIOGRAM FOLLOW UP STUDY  08/22/2017  . IR RADIOLOGIST EVAL & MGMT  06/26/2016  . IR RADIOLOGIST EVAL & MGMT  05/08/2017  . IR RADIOLOGY PERIPHERAL GUIDED IV START  12/03/2017  . IR TRANSCATH/EMBOLIZ  08/22/2017  . IR US GUIDE VASC ACCESS RIGHT  12/03/2017  . LAPAROSCOPY    . NECK SURGERY  50 yrs ago   Left side tumor  . RADIOLOGY WITH ANESTHESIA N/A 05/25/2014   Procedure: RADIOLOGY WITH ANESTHESIA;  Surgeon: [name], MD;  Location: [facility];  Service: Radiology;  Laterality: N/A;  . RADIOLOGY WITH ANESTHESIA N/A 08/12/2014   Procedure: RADIOLOGY WITH ANESTHESIA;  Surgeon: [name], MD;  Location: [facility];  Service: Radiology;  Laterality: N/A;  . RADIOLOGY WITH ANESTHESIA N/A 03/15/2015   Procedure: MRI OF BRAIN WITHOUT CONTRAST;  Surgeon: Medication Radiologist, MD;  Location: [facility];  Service: Radiology;  Laterality: N/A;  DR. TAT/MRI  . US ECHOCARDIOGRAPHY  04-26-2009   EF 65-70%  . VESICOVAGINAL FISTULA CLOSURE W/ TAH  25 yrs ago  . WOUND EXPLORATION Right 08/22/2017   Procedure: REMOVAL OF RIGHT COMMON CAROTID SHEATH AND WOUND CLOSURE;  Surgeon: Rosetta Posner, MD;  [facility];  Service: Vascular;   Laterality: Right;   Social History   Occupational History  . Not on file  Tobacco Use  . Smoking status: Former Smoker    Packs/day: 1.00    Years: 30.00    Pack years: 30.00    Types: Cigarettes    Quit date: 04/26/2014    Years since quitting: 6.1  . Smokeless tobacco: Never Used  Vaping Use  . Vaping Use: Never used  Substance and Sexual Activity  . Alcohol use: No  . Drug use: No  . Sexual activity: Not on file

## 2020-06-08 ENCOUNTER — Other Ambulatory Visit: Payer: Self-pay

## 2020-06-08 ENCOUNTER — Ambulatory Visit (INDEPENDENT_AMBULATORY_CARE_PROVIDER_SITE_OTHER): Payer: Medicare Other | Admitting: Family

## 2020-06-08 ENCOUNTER — Encounter: Payer: Self-pay | Admitting: Family

## 2020-06-08 DIAGNOSIS — I872 Venous insufficiency (chronic) (peripheral): Secondary | ICD-10-CM

## 2020-06-08 NOTE — Progress Notes (Signed)
Office Visit Note   Patient: Jeanne Lawson           Date of Birth: Aug 06, 1945           MRN: [id number] Visit Date: 06/08/2020              Requested by: [name], MD [address] [place],  [place] [address] PCP: [name], MD  No chief complaint on file.     HPI: The patient is a 75 year old woman who presents today in follow-up.  She has been having serial compression wrapping with Profore to bilateral lower extremities for venous insufficiency.    She is ambulating with a walker with postop shoes today.  She feels pleased with her reduction in her edema at this time.  She would like to discontinue serial compression wrapping and resume her compression garments.  Assessment & Plan: Visit Diagnoses:  1. Edema of both lower extremities due to peripheral venous insufficiency     Plan: We will have her resume her compression garments.  We will check in with her in the next 1 to 2 weeks.  Discussed the importance of compression and elevation  Follow-Up Instructions: No follow-ups on file.   Ortho Exam  Patient is alert, oriented, no adenopathy, well-dressed, normal affect, normal respiratory effort. Bilateral lower extremity there is good wrinkling of the skin below the knees.  There is some brawny skin color changes to the bilateral lower extremities there is no erythema no ulceration no skin breakdown Imaging: No results found. No images are attached to the encounter.  Labs: Lab Results  Component Value Date   HGBA1C 5.1 03/08/2016   HGBA1C 5.5 03/13/2015   HGBA1C 5.6 05/03/2014   REPTSTATUS 06/14/2017 FINAL 06/13/2017   CULT  06/13/2017    NO MRSA DETECTED Performed at [facility], [place] [address] [place], [place] [address]      Lab Results  Component Value Date   ALBUMIN 4.0 07/22/2019   ALBUMIN 2.7 (L) 08/23/2017   ALBUMIN 2.5 (L) 08/22/2017    Lab Results  Component Value Date   MG 2.2 08/24/2017   MG 1.8 08/23/2017   MG 1.7  08/22/2017   No results found for: VD25OH  No results found for: PREALBUMIN CBC EXTENDED Latest Ref Rng & Units 07/22/2019 08/24/2017 08/23/2017  WBC 3.4 - 10.8 x10E3/uL 3.8 4.9 5.5  RBC 3.77 - 5.28 x10E6/uL 3.93 2.90(L) 3.09(L)  HGB 11.1 - 15.9 g/dL 10.6(L) 7.9(L) 8.6(L)  HCT 34.0 - 46.6 % 32.2(L) 25.9(L) 26.9(L)  PLT 150 - 450 x10E3/uL 116(L) 116(L) 107(L)  NEUTROABS 1.7 - 7.7 K/uL - 3.3 -  LYMPHSABS 0.7 - 4.0 K/uL - 1.1 -     There is no height or weight on file to calculate BMI.  Orders:  No orders of the defined types were placed in this encounter.  No orders of the defined types were placed in this encounter.    Procedures: No procedures performed  Clinical Data: No additional findings.  ROS:  All other systems negative, except as noted in the HPI. Review of Systems  Constitutional: Negative for chills and fever.  Respiratory: Negative for shortness of breath.   Cardiovascular: Positive for leg swelling.  Skin: Negative for color change and wound.    Objective: Vital Signs: There were no vitals taken for this visit.  Specialty Comments:  No specialty comments available.  PMFS History: Patient Active Problem List   Diagnosis Date Noted  . Acute cholecystitis 03/07/2016  .  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 Aneurysm, cerebral, nonruptured 05/31/2015  . Dizziness and giddiness 05/31/2015  . Altered mental status   . Acute CVA (cerebrovascular accident) ([place]) 03/15/2015  . Dysphasia 03/12/2015  . Thrombocytopenia ([place]) 03/12/2015  . Hypokalemia   . Endotracheally intubated   . Essential hypertension   . Intracranial aneurysm 11/18/2014  . Respiratory failure ([place])   . Cerebral aneurysm   . Brain aneurysm   . Aphasia   . Palmar erythema   . CVA (cerebral infarction) 05/02/2014  . Aneurysm ([place]) 05/02/2014  . Expressive aphasia 05/02/2014  . 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 Tobacco abuse  10/24/2010  . TIA (transient ischemic attack)   . Hypertension   . Ventricular hypertrophy   . CVA 02/23/2010  . STRESS FRACTURE, FOOT 02/22/2010   Past Medical History:  Diagnosis Date  . Cancer (HCC)    right leg skin   . COPD (chronic obstructive pulmonary disease) ([place])   . Coronary artery disease 1996   Known with prior mild lesion of LAD demonstrated by Cardiac Catheterization in 1996  . Edema of foot    She has a history of chronic edema of the left dated back to age 58 when she suufered severe frostbite playing  on the snow as a child  . Hypertension   . Lower extremity edema   . PAC (premature atrial contraction)   . Pancreatitis    x2  . PONV (postoperative nausea and vomiting)    problems waking up last time 4/16 only time  . 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  . Stroke ([place]) 04/2014   She had had a previous thrombotic stroke  involving the right corona radiata in October 2010; left arm and leg weakness  . TIA (transient ischemic attack)    She was hospitalized 04-23-09 through 04-27-09 for involving right side of the body  . Tobacco abuse    Ongoing   . Ventricular hypertrophy 04/2009   LVH with diastolic dysfunction by echo. Has normal EF.    Family History  Problem Relation Age of Onset  . Emphysema Father   . Diabetes Father   . Aneurysm Sister   . Stroke Mother   . Hypertension Mother   . Heart disease Mother   . Hypertension Daughter   . Thyroid disease Daughter     Past Surgical History:  Procedure Laterality Date  . ABDOMINAL HYSTERECTOMY    . APPENDECTOMY    . CARDIAC CATHETERIZATION  1996   Mild CAD with vasospasm  . CARDIOVASCULAR STRESS TEST  12-02-2001   EF 70%  . CHOLECYSTECTOMY N/A 03/09/2016   Procedure: LAPAROSCOPIC CHOLECYSTECTOMY WITH INTRAOPERATIVE CHOLANGIOGRAM;  Surgeon: Georganna Skeans, MD;  Location: [facility];  Service: General;  Laterality: N/A;  .  ENDARTERECTOMY Right 08/12/2014   Procedure: RIGHT  COMMON CAROTID ARTERY EXPOSURE FOR INTERVENTIONAL RADIOLOGY PROCEDURE BY DR.DEVASHWAR,Insertion 6 FR sheath;  Surgeon: Elam Dutch, MD;  Location: [facility];  Service: Vascular;  Laterality: Right;  . ENDARTERECTOMY Right 08/12/2014   Procedure:  CAROTID  EXPOSURE CLOSURE RIGHT NECK, REPAIR RIGHT COMMON CAROTID ARTERY;  Surgeon: Elam Dutch, MD;  Location: [facility];  Service: Vascular;  Laterality: Right;  . ENDARTERECTOMY Left 11/18/2014   Procedure: CAROTID EXPOSURE;  Surgeon: Elam Dutch, MD;  Location: [facility];  Service: Vascular;  Laterality: Left;  .  ENDARTERECTOMY Left 11/18/2014   Procedure: CLOSURE CAROTID;  Surgeon: Elam Dutch, MD;  Location: [facility];  Service: Vascular;  Laterality: Left;  . ENDARTERECTOMY Right 08/22/2017   Procedure: EXPOSURE CAROTID ARTERY FOR [place] [facility]GERY;  Surgeon: Elam Dutch, MD;  Location: [facility];  Service: Vascular;  Laterality: Right;  . IR ANGIO INTRA EXTRACRAN SEL INTERNAL CAROTID UNI R MOD SED  08/22/2017  . IR ANGIOGRAM FOLLOW UP STUDY  08/22/2017  . IR RADIOLOGIST EVAL & MGMT  06/26/2016  . IR RADIOLOGIST EVAL & MGMT  05/08/2017  . IR RADIOLOGY PERIPHERAL GUIDED IV START  12/03/2017  . IR TRANSCATH/EMBOLIZ  08/22/2017  . IR US GUIDE VASC ACCESS RIGHT  12/03/2017  . LAPAROSCOPY    . NECK SURGERY  50 yrs ago   Left side tumor  . RADIOLOGY WITH ANESTHESIA N/A 05/25/2014   Procedure: RADIOLOGY WITH ANESTHESIA;  Surgeon: [name], MD;  Location: [facility];  Service: Radiology;  Laterality: N/A;  . RADIOLOGY WITH ANESTHESIA N/A 08/12/2014   Procedure: RADIOLOGY WITH ANESTHESIA;  Surgeon: [name], MD;  Location: [facility];  Service: Radiology;  Laterality: N/A;  . RADIOLOGY WITH ANESTHESIA N/A 03/15/2015   Procedure: MRI OF BRAIN WITHOUT CONTRAST;  Surgeon: Medication Radiologist, MD;  Location: [facility];  Service: Radiology;  Laterality: N/A;  DR. TAT/MRI  . US ECHOCARDIOGRAPHY  04-26-2009    EF 65-70%  . VESICOVAGINAL FISTULA CLOSURE W/ TAH  25 yrs ago  . WOUND EXPLORATION Right 08/22/2017   Procedure: REMOVAL OF RIGHT COMMON CAROTID SHEATH AND WOUND CLOSURE;  Surgeon: Rosetta Posner, MD;  [facility];  Service: Vascular;  Laterality: Right;   Social History   Occupational History  . Not on file  Tobacco Use  . Smoking status: Former Smoker    Packs/day: 1.00    Years: 30.00    Pack years: 30.00    Types: Cigarettes    Quit date: 04/26/2014    Years since quitting: 6.1  . Smokeless tobacco: Never Used  Vaping Use  . Vaping Use: Never used  Substance and Sexual Activity  . Alcohol use: No  . Drug use: No  . Sexual activity: Not on file

## 2020-06-25 ENCOUNTER — Ambulatory Visit: Payer: Medicare Other | Admitting: Physician Assistant

## 2020-06-30 ENCOUNTER — Ambulatory Visit: Payer: Medicare Other | Admitting: Physician Assistant

## 2020-07-06 ENCOUNTER — Encounter: Payer: Self-pay | Admitting: Orthopedic Surgery

## 2020-07-06 ENCOUNTER — Ambulatory Visit (INDEPENDENT_AMBULATORY_CARE_PROVIDER_SITE_OTHER): Payer: Medicare Other | Admitting: Orthopedic Surgery

## 2020-07-06 ENCOUNTER — Other Ambulatory Visit: Payer: Self-pay

## 2020-07-06 DIAGNOSIS — M79604 Pain in right leg: Secondary | ICD-10-CM | POA: Diagnosis not present

## 2020-07-06 DIAGNOSIS — M79605 Pain in left leg: Secondary | ICD-10-CM | POA: Diagnosis not present

## 2020-07-06 DIAGNOSIS — I872 Venous insufficiency (chronic) (peripheral): Secondary | ICD-10-CM | POA: Diagnosis not present

## 2020-07-06 NOTE — Progress Notes (Signed)
Office Visit Note   Patient: Jeanne Lawson           Date of Birth: 11-May-1945           MRN: [id number] Visit Date: 07/06/2020              Requested by: [name], MD [address] [place],  [place] [address] PCP: [name], MD  Chief Complaint  Patient presents with   Right Leg - Follow-up   Left Leg - Follow-up      HPI: Patient is a 75 year old woman who presents in follow-up for venous insufficiency bilateral lower extremity use.  Patient states the swelling is worse she has been using Lasix 40 mg daily and 40 mEq of potassium daily.  She states that she has been unable to wear her compression stockings.  She states that she has been elevating her legs.  She feels like the swelling is improved.  Assessment & Plan: Visit Diagnoses:  1. Edema of both lower extremities due to peripheral venous insufficiency     Plan: We will apply a Dynaflex compression wrap to both legs recommended exercise elevation with follow-up in 1 week.  She will bring her compression stockings with her she may need a larger compression stocking after the wraps are removed.  Follow-Up Instructions: Return in about 1 week (around 07/13/2020).   Ortho Exam  Patient is alert, oriented, no adenopathy, well-dressed, normal affect, normal respiratory effort. Examination of both legs she does have varicose veins bilaterally there were no open ulcers on either leg but massive swelling in both lower extremities the right calf is 50 cm in circumference the left calf is 48 cm in circumference.  There is no cellulitis no weeping edema.  Imaging: No results found. No images are attached to the encounter.  Labs: Lab Results  Component Value Date   HGBA1C 5.1 03/08/2016   HGBA1C 5.5 03/13/2015   HGBA1C 5.6 05/03/2014   REPTSTATUS 06/14/2017 FINAL 06/13/2017   CULT  06/13/2017    NO MRSA DETECTED Performed at [facility], [place] [address] [place], [place] [address]      Lab Results   Component Value Date   ALBUMIN 4.0 07/22/2019   ALBUMIN 2.7 (L) 08/23/2017   ALBUMIN 2.5 (L) 08/22/2017    Lab Results  Component Value Date   MG 2.2 08/24/2017   MG 1.8 08/23/2017   MG 1.7 08/22/2017   No results found for: VD25OH  No results found for: PREALBUMIN CBC EXTENDED Latest Ref Rng & Units 07/22/2019 08/24/2017 08/23/2017  WBC 3.4 - 10.8 x10E3/uL 3.8 4.9 5.5  RBC 3.77 - 5.28 x10E6/uL 3.93 2.90(L) 3.09(L)  HGB 11.1 - 15.9 g/dL 10.6(L) 7.9(L) 8.6(L)  HCT 34.0 - 46.6 % 32.2(L) 25.9(L) 26.9(L)  PLT 150 - 450 x10E3/uL 116(L) 116(L) 107(L)  NEUTROABS 1.7 - 7.7 K/uL - 3.3 -  LYMPHSABS 0.7 - 4.0 K/uL - 1.1 -     There is no height or weight on file to calculate BMI.  Orders:  No orders of the defined types were placed in this encounter.  No orders of the defined types were placed in this encounter.    Procedures: No procedures performed  Clinical Data: No additional findings.  ROS:  All other systems negative, except as noted in the HPI. Review of Systems  Objective: Vital Signs: There were no vitals taken for this visit.  Specialty Comments:  No specialty comments available.  PMFS History: Patient Active Problem List  Diagnosis Date Noted   Acute cholecystitis 03/07/2016   Small vessel disease, cerebrovascular 05/31/2015   Aneurysm, cerebral, nonruptured 05/31/2015   Dizziness and giddiness 05/31/2015   Altered mental status    Acute CVA (cerebrovascular accident) ([place]) 03/15/2015   Dysphasia 03/12/2015   Thrombocytopenia ([place]) 03/12/2015   Hypokalemia    Endotracheally intubated    Essential hypertension    Intracranial aneurysm 11/18/2014   Respiratory failure (HCC)    Cerebral aneurysm    Brain aneurysm    Aphasia    Palmar erythema    CVA (cerebral infarction) 05/02/2014   Aneurysm ([place]) 05/02/2014   Expressive aphasia 05/02/2014   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   Tobacco abuse 10/24/2010    TIA (transient ischemic attack)    Hypertension    Ventricular hypertrophy    CVA 02/23/2010   STRESS FRACTURE, FOOT 02/22/2010   Past Medical History:  Diagnosis Date   Cancer ([place])    right leg skin    COPD (chronic obstructive pulmonary disease) ([place])    Coronary artery disease 1996   Known with prior mild lesion of LAD demonstrated by Cardiac Catheterization in 1996   Edema of foot    She has a history of chronic edema of the left dated back to age 43 when she suufered severe frostbite playing  on the snow as a child   Hypertension    Lower extremity edema    PAC (premature atrial contraction)    Pancreatitis    x2   PONV (postoperative nausea and vomiting)    problems waking up last time 4/16 only time   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 Has normal EF.    Family History  Problem Relation Age of Onset   Emphysema Father    Diabetes Father    Aneurysm Sister    Stroke Mother    Hypertension Mother    Heart disease Mother    Hypertension Daughter    Thyroid disease Daughter     Past Surgical History:  Procedure Laterality Date   ABDOMINAL HYSTERECTOMY     APPENDECTOMY     CARDIAC CATHETERIZATION  1996   Mild CAD with vasospasm   CARDIOVASCULAR STRESS TEST  12-02-2001   EF 70%   CHOLECYSTECTOMY N/A 03/09/2016   Procedure: LAPAROSCOPIC CHOLECYSTECTOMY WITH INTRAOPERATIVE CHOLANGIOGRAM;  Surgeon: Georganna Skeans, MD;  Location: [facility];  Service: General;  Laterality: N/A;   ENDARTERECTOMY Right 08/12/2014   Procedure: RIGHT   COMMON CAROTID ARTERY EXPOSURE FOR INTERVENTIONAL RADIOLOGY PROCEDURE BY DR.DEVASHWAR,Insertion 6 FR sheath;  Surgeon: Elam Dutch, MD;  Location: [facility];  Service: Vascular;  Laterality: Right;   ENDARTERECTOMY Right 08/12/2014   Procedure:  CAROTID  EXPOSURE CLOSURE RIGHT NECK, REPAIR RIGHT COMMON CAROTID ARTERY;  Surgeon: Elam Dutch, MD;  Location: [facility];  Service: Vascular;  Laterality: Right;   ENDARTERECTOMY Left 11/18/2014   Procedure: CAROTID EXPOSURE;  Surgeon: Elam Dutch, MD;  [facility];  Service: Vascular;  Laterality: Left;   ENDARTERECTOMY Left 11/18/2014   Procedure: CLOSURE CAROTID;  Surgeon: Elam Dutch, MD;  Location: [facility];  Service: Vascular;  Laterality: Left;   ENDARTERECTOMY Right 08/22/2017   Procedure: EXPOSURE CAROTID ARTERY FOR [facility]GERY;  Surgeon: Elam Dutch, MD;  Location: [facility];  Service: Vascular;  Laterality: Right;   IR ANGIO INTRA EXTRACRAN SEL INTERNAL CAROTID UNI R MOD SED  08/22/2017   IR ANGIOGRAM FOLLOW UP STUDY  08/22/2017   IR RADIOLOGIST EVAL & MGMT  06/26/2016   IR RADIOLOGIST EVAL & MGMT  05/08/2017   IR RADIOLOGY PERIPHERAL GUIDED IV START  12/03/2017   IR TRANSCATH/EMBOLIZ  08/22/2017   IR US GUIDE VASC ACCESS RIGHT  12/03/2017   LAPAROSCOPY     NECK SURGERY  50 yrs ago   Left side tumor   RADIOLOGY WITH ANESTHESIA N/A 05/25/2014   Procedure: RADIOLOGY WITH ANESTHESIA;  Surgeon: [name], MD;  Location: [facility];  Service: Radiology;  Laterality: N/A;   RADIOLOGY WITH ANESTHESIA N/A 08/12/2014   Procedure: RADIOLOGY WITH ANESTHESIA;  Surgeon: [name], MD;  Location: [facility];  Service: Radiology;  Laterality: N/A;   RADIOLOGY WITH ANESTHESIA N/A 03/15/2015   Procedure: MRI OF BRAIN WITHOUT CONTRAST;  Surgeon: Medication Radiologist, MD;  Location: [facility];  Service: Radiology;  Laterality: N/A;  DR. TAT/MRI   US ECHOCARDIOGRAPHY  04-26-2009   EF 65-70%   VESICOVAGINAL FISTULA CLOSURE W/ TAH  25 yrs ago    WOUND EXPLORATION Right 08/22/2017   Procedure: REMOVAL OF RIGHT COMMON CAROTID SHEATH AND WOUND CLOSURE;  Surgeon: Rosetta Posner, MD;  [facility];  Service: Vascular;  Laterality: Right;   Social History   Occupational History   Not on file  Tobacco Use   Smoking status: Former    Packs/day: 1.00    Years: 30.00    Pack years: 30.00    Types: Cigarettes    Quit date: 04/26/2014    Years since quitting: 6.2   Smokeless tobacco: Never  Vaping Use   Vaping Use: Never used  Substance and Sexual Activity   Alcohol use: No   Drug use: No   Sexual activity: Not on file

## 2020-07-07 ENCOUNTER — Telehealth: Payer: Self-pay

## 2020-07-07 NOTE — Telephone Encounter (Signed)
Dorothea Ogle, PT with [facility] called stating that wraps have fallen down on patient's leg.  Left leg wrap is still on,but not all the way down and right leg wrap is down to patient's foot. Stated that this is a safety hazard for patient.  Would like a call back to discuss if patient needs to be rewrapped or if wraps need to be removed?  CB# [phone]

## 2020-07-07 NOTE — Telephone Encounter (Signed)
Called pt and offered her an appt for today and the 16th, pt stated because she [place] she can only get a driver for 2/89/02. Jeanne Lawson did go ahead and remove the part of the bandage that was by her toes and they will CB with any other questions.

## 2020-07-07 NOTE — Telephone Encounter (Signed)
Had been advised to apply some type of compression ace etc will come in on tuesday

## 2020-07-07 NOTE — Telephone Encounter (Signed)
Can you please call this pt and make an appt for the pt to come in today for eval.

## 2020-07-08 ENCOUNTER — Telehealth: Payer: Self-pay | Admitting: Orthopedic Surgery

## 2020-07-08 NOTE — Telephone Encounter (Signed)
I called pt and advised that she can remove the other dressing and try to use ace bandage for compression and keep appt Tuesday continue to elevate and call with any concerns.

## 2020-07-08 NOTE — Telephone Encounter (Signed)
PT called stating the rest of her wrap on her R leg is slipping down and so her therapist, Dorothea Ogle wanted her to ask if he could get orders to take the wrap off her on 07/09/20? Pt states she still doesn't have a ride or way to get here so she would like a CB as soon as possible to let her know if this is ok. Pt states she is still planning to keep her appt for 07/13/20.  [phone]

## 2020-07-13 ENCOUNTER — Ambulatory Visit (INDEPENDENT_AMBULATORY_CARE_PROVIDER_SITE_OTHER): Payer: Medicare Other | Admitting: Family

## 2020-07-13 ENCOUNTER — Encounter: Payer: Self-pay | Admitting: Family

## 2020-07-13 DIAGNOSIS — I872 Venous insufficiency (chronic) (peripheral): Secondary | ICD-10-CM | POA: Insufficient documentation

## 2020-07-13 NOTE — Progress Notes (Signed)
Office Visit Note   Patient: Jeanne Lawson           Date of Birth: 1945/07/20           MRN: [id number] Visit Date: 07/13/2020              Requested by: [name], MD [address] [place],  [place] [address] PCP: [name], MD  Chief Complaint  Patient presents with   Left Leg - Follow-up   Right Leg - Follow-up      HPI: Patient is a 75 year old woman who presents in follow-up for venous insufficiency bilateral lower extremity use.  Patient states the swelling is stable.  she has been using Lasix 40 mg daily and 40 mEq of potassium daily.  she she states that she has been elevating her feet around-the-clock since last visit unfortunately the wrap on the right slid down and was ultimately cut off 1 day after application last week the left wrap did slide down significantly.  She is very concerned about with the long-term plan as she is unable to don compression garments independently due to her deficits status post stroke  Assessment & Plan: Visit Diagnoses:  1. Edema of both lower extremities due to peripheral venous insufficiency     Plan: After a long discussion about different compression options including applying new boots today and doing this for a long period of time with the assistance of home health ultimately the patient has decided to stop doing the serial wraps.  She will purchase some new compression garments have provided ankle and calf measurements today she will order these gave her prescription for compression garments 20 to 6mmHg graduated compression.  She is welcome to follow-up on an as-needed basis.  Follow-Up Instructions: No follow-ups on file.   Ortho Exam  Patient is alert, oriented, no adenopathy, well-dressed, normal affect, normal respiratory effort. Examination of both legs she does have varicose veins bilaterally there were no open ulcers on either leg but massive swelling in both lower extremities the right calf is 48 cm in  circumference ankle is 35 cm.  There is no cellulitis no weeping edema.  Wrinkling of skin on the left from the rolled down compression wrap there is some reduction in edema on the left.  There is no erythema no open ulcers no weeping  Imaging: No results found. No images are attached to the encounter.  Labs: Lab Results  Component Value Date   HGBA1C 5.1 03/08/2016   HGBA1C 5.5 03/13/2015   HGBA1C 5.6 05/03/2014   REPTSTATUS 06/14/2017 FINAL 06/13/2017   CULT  06/13/2017    NO MRSA DETECTED Performed at [facility], [place] [address], [place], [place] [address]      Lab Results  Component Value Date   ALBUMIN 4.0 07/22/2019   ALBUMIN 2.7 (L) 08/23/2017   ALBUMIN 2.5 (L) 08/22/2017    Lab Results  Component Value Date   MG 2.2 08/24/2017   MG 1.8 08/23/2017   MG 1.7 08/22/2017   No results found for: VD25OH  No results found for: PREALBUMIN CBC EXTENDED Latest Ref Rng & Units 07/22/2019 08/24/2017 08/23/2017  WBC 3.4 - 10.8 x10E3/uL 3.8 4.9 5.5  RBC 3.77 - 5.28 x10E6/uL 3.93 2.90(L) 3.09(L)  HGB 11.1 - 15.9 g/dL 10.6(L) 7.9(L) 8.6(L)  HCT 34.0 - 46.6 % 32.2(L) 25.9(L) 26.9(L)  PLT 150 - 450 x10E3/uL 116(L) 116(L) 107(L)  NEUTROABS 1.7 - 7.7 K/uL - 3.3 -  LYMPHSABS 0.7 -  4.0 K/uL - 1.1 -     There is no height or weight on file to calculate BMI.  Orders:  No orders of the defined types were placed in this encounter.  No orders of the defined types were placed in this encounter.    Procedures: No procedures performed  Clinical Data: No additional findings.  ROS:  All other systems negative, except as noted in the HPI. Review of Systems  Objective: Vital Signs: There were no vitals taken for this visit.  Specialty Comments:  No specialty comments available.  PMFS History: Patient Active Problem List   Diagnosis Date Noted   Edema of both lower extremities due to peripheral venous insufficiency 07/13/2020   Acute cholecystitis 03/07/2016   Small  vessel disease, cerebrovascular 05/31/2015   Aneurysm, cerebral, nonruptured 05/31/2015   Dizziness and giddiness 05/31/2015   Altered mental status    Acute CVA (cerebrovascular accident) ([place]) 03/15/2015   Dysphasia 03/12/2015   Thrombocytopenia ([place]) 03/12/2015   Hypokalemia    Endotracheally intubated    Essential hypertension    Intracranial aneurysm 11/18/2014   Respiratory failure ([facility])    Cerebral aneurysm    Brain aneurysm    Aphasia    Palmar erythema    CVA (cerebral infarction) 05/02/2014   Aneurysm ([place]) 05/02/2014   Expressive aphasia 05/02/2014   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   Tobacco abuse 10/24/2010   TIA (transient ischemic attack)    Hypertension    Ventricular hypertrophy    CVA 02/23/2010   STRESS FRACTURE, FOOT 02/22/2010   Past Medical History:  Diagnosis Date   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    COPD (chronic obstructive pulmonary disease) ([place])    Coronary artery disease 1996   Known with prior mild lesion of LAD demonstrated by Cardiac Catheterization in 1996   Edema of foot    She has a history of chronic edema of the left dated back to age 66 when she suufered severe frostbite playing  on the snow as a child   Hypertension    Lower extremity edema    PAC (premature atrial contraction)    Pancreatitis    x2   PONV (postoperative nausea and vomiting)    problems waking up last time 4/16 only time   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   Stroke [facility]) 04/2014   She had had a previous thrombotic stroke  involving the right corona radiata in October 2010; left arm and leg weakness   TIA (transient ischemic attack)    She was hospitalized 04-23-09 through 04-27-09 for involving right side of the body   Tobacco abuse    Ongoing    Ventricular hypertrophy 04/2009   LVH with diastolic dysfunction by echo. Has normal EF.     Family History  Problem Relation Age of Onset   Emphysema Father    Diabetes Father    Aneurysm Sister    Stroke Mother    Hypertension Mother    Heart disease Mother    Hypertension Daughter    Thyroid disease Daughter     Past Surgical History:  Procedure Laterality Date   ABDOMINAL HYSTERECTOMY     APPENDECTOMY     CARDIAC CATHETERIZATION  1996   Mild CAD with vasospasm   CARDIOVASCULAR STRESS TEST  12-02-2001   EF 70%  CHOLECYSTECTOMY N/A 03/09/2016   Procedure: LAPAROSCOPIC CHOLECYSTECTOMY WITH INTRAOPERATIVE CHOLANGIOGRAM;  Surgeon: Georganna Skeans, MD;  Location: [facility];  Service: General;  Laterality: N/A;   ENDARTERECTOMY Right 08/12/2014   Procedure: RIGHT  COMMON CAROTID ARTERY EXPOSURE FOR INTERVENTIONAL RADIOLOGY PROCEDURE BY DR.DEVASHWAR,Insertion 6 FR sheath;  Surgeon: Elam Dutch, MD;  Location: [facility];  Service: Vascular;  Laterality: Right;   ENDARTERECTOMY Right 08/12/2014   Procedure:  CAROTID  EXPOSURE CLOSURE RIGHT NECK, REPAIR RIGHT COMMON CAROTID ARTERY;  Surgeon: Elam Dutch, MD;  Location: [facility];  Service: Vascular;  Laterality: Right;   ENDARTERECTOMY Left 11/18/2014   Procedure: CAROTID EXPOSURE;  Surgeon: Elam Dutch, MD;  Location: [facility];  Service: Vascular;  Laterality: Left;   ENDARTERECTOMY Left 11/18/2014   Procedure: CLOSURE CAROTID;  Surgeon: Elam Dutch, MD;  Location: [facility];  Service: Vascular;  Laterality: Left;   ENDARTERECTOMY Right 08/22/2017   Procedure: EXPOSURE CAROTID ARTERY FOR [place];  Surgeon: Elam Dutch, MD;  Location: [facility];  Service: Vascular;  Laterality: Right;   IR ANGIO INTRA EXTRACRAN SEL INTERNAL CAROTID UNI R MOD SED  08/22/2017   IR ANGIOGRAM FOLLOW UP STUDY  08/22/2017   IR RADIOLOGIST EVAL & MGMT  06/26/2016   IR RADIOLOGIST EVAL & MGMT  05/08/2017   IR RADIOLOGY PERIPHERAL GUIDED IV START  12/03/2017   IR TRANSCATH/EMBOLIZ  08/22/2017   IR US GUIDE VASC ACCESS RIGHT  12/03/2017    LAPAROSCOPY     NECK SURGERY  50 yrs ago   Left side tumor   RADIOLOGY WITH ANESTHESIA N/A 05/25/2014   Procedure: RADIOLOGY WITH ANESTHESIA;  Surgeon: [name], MD;  Location: [facility];  Service: Radiology;  Laterality: N/A;   RADIOLOGY WITH ANESTHESIA N/A 08/12/2014   Procedure: RADIOLOGY WITH ANESTHESIA;  Surgeon: [name], MD;  Location: [facility];  Service: Radiology;  Laterality: N/A;   RADIOLOGY WITH ANESTHESIA N/A 03/15/2015   Procedure: MRI OF BRAIN WITHOUT CONTRAST;  Surgeon: Medication Radiologist, MD;  Location: [facility];  Service: Radiology;  Laterality: N/A;  DR. TAT/MRI   US ECHOCARDIOGRAPHY  04-26-2009   EF 65-70%   VESICOVAGINAL FISTULA CLOSURE W/ TAH  25 yrs ago   WOUND EXPLORATION Right 08/22/2017   Procedure: REMOVAL OF RIGHT COMMON CAROTID SHEATH AND WOUND CLOSURE;  Surgeon: Rosetta Posner, MD;  [facility];  Service: Vascular;  Laterality: Right;   Social History   Occupational History   Not on file  Tobacco Use   Smoking status: Former    Packs/day: 1.00    Years: 30.00    Pack years: 30.00    Types: Cigarettes    Quit date: 04/26/2014    Years since quitting: 6.2   Smokeless tobacco: Never  Vaping Use   Vaping Use: Never used  Substance and Sexual Activity   Alcohol use: No   Drug use: No   Sexual activity: Not on file

## 2020-07-15 ENCOUNTER — Ambulatory Visit: Payer: Medicare Other | Admitting: Orthopedic Surgery

## 2020-08-06 ENCOUNTER — Ambulatory Visit: Payer: Medicare Other | Admitting: Cardiology

## 2020-08-09 ENCOUNTER — Other Ambulatory Visit: Payer: Self-pay | Admitting: Orthopedic Surgery

## 2020-08-09 ENCOUNTER — Telehealth: Payer: Self-pay | Admitting: Orthopedic Surgery

## 2020-08-09 MED ORDER — HYDROCODONE-ACETAMINOPHEN 5-325 MG PO TABS: 1.0000 | ORAL_TABLET | Freq: Four times a day (QID) | ORAL | 0 refills | Status: DC | PRN

## 2020-08-09 NOTE — Telephone Encounter (Signed)
Pt requesting refill on hydrocodone.States her back is in pain. Wants it sent to [facility] [address] [place], [place] [address]  [phone]  CB

## 2020-08-09 NOTE — Telephone Encounter (Signed)
Please advise 

## 2020-09-08 ENCOUNTER — Other Ambulatory Visit: Payer: Self-pay | Admitting: *Deleted

## 2020-09-08 DIAGNOSIS — I119 Hypertensive heart disease without heart failure: Secondary | ICD-10-CM

## 2020-09-08 DIAGNOSIS — I2510 Atherosclerotic heart disease of native coronary artery without angina pectoris: Secondary | ICD-10-CM

## 2020-09-08 DIAGNOSIS — E782 Mixed hyperlipidemia: Secondary | ICD-10-CM

## 2020-09-08 MED ORDER — TOPROL XL 50 MG PO TB24: ORAL_TABLET | ORAL | 1 refills | Status: DC

## 2020-10-18 ENCOUNTER — Other Ambulatory Visit: Payer: Self-pay | Admitting: Internal Medicine

## 2020-10-18 DIAGNOSIS — I671 Cerebral aneurysm, nonruptured: Secondary | ICD-10-CM

## 2020-10-20 ENCOUNTER — Other Ambulatory Visit: Payer: Self-pay | Admitting: Internal Medicine

## 2020-10-20 DIAGNOSIS — I671 Cerebral aneurysm, nonruptured: Secondary | ICD-10-CM

## 2020-10-26 ENCOUNTER — Ambulatory Visit: Payer: Medicare Other | Attending: Internal Medicine | Admitting: Physical Therapy

## 2020-10-26 ENCOUNTER — Other Ambulatory Visit: Payer: Self-pay

## 2020-10-26 DIAGNOSIS — R29898 Other symptoms and signs involving the musculoskeletal system: Secondary | ICD-10-CM | POA: Diagnosis present

## 2020-10-26 DIAGNOSIS — R2689 Other abnormalities of gait and mobility: Secondary | ICD-10-CM | POA: Insufficient documentation

## 2020-10-26 DIAGNOSIS — M6281 Muscle weakness (generalized): Secondary | ICD-10-CM | POA: Insufficient documentation

## 2020-10-26 DIAGNOSIS — R2681 Unsteadiness on feet: Secondary | ICD-10-CM | POA: Diagnosis present

## 2020-10-27 NOTE — Therapy (Signed)
[facility] [address] [place], [place], [address] Phone: [phone]   Fax:  [phone]  Physical Therapy Evaluation  Patient Details  Name: Jeanne Lawson MRN: [id number] Date of Birth: [birth year] Referring Provider (PT): Dr. 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    COPD (chronic obstructive pulmonary disease) ([place])    Coronary artery disease 1996   Known with prior mild lesion of LAD demonstrated by Cardiac Catheterization in 1996   Edema of foot    She has a history of chronic edema of the left dated back to age 63 when she suufered severe frostbite playing  on the snow as a child   Hypertension    Lower extremity edema    PAC (premature atrial contraction)    Pancreatitis    x2   PONV (postoperative nausea and vomiting)    problems waking up last time 4/16 only time   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   Stroke [facility]) 04/2014   She had had a previous thrombotic stroke  involving the right corona radiata in October 2010; left arm and leg weakness   TIA (transient ischemic attack)    She was hospitalized 04-23-09 through 04-27-09 for involving right side of the body   Tobacco abuse    Ongoing    Ventricular hypertrophy 04/2009   LVH with diastolic dysfunction by echo. Has normal EF.    Past Surgical History:  Procedure Laterality Date    ABDOMINAL HYSTERECTOMY     APPENDECTOMY     CARDIAC CATHETERIZATION  1996   Mild CAD with vasospasm   CARDIOVASCULAR STRESS TEST  12-02-2001   EF 70%   CHOLECYSTECTOMY N/A 03/09/2016   Procedure: LAPAROSCOPIC CHOLECYSTECTOMY WITH INTRAOPERATIVE CHOLANGIOGRAM;  Surgeon: Georganna Skeans, MD;  Location: [facility];  Service: General;  Laterality: N/A;   ENDARTERECTOMY Right 08/12/2014   Procedure: RIGHT  COMMON CAROTID ARTERY EXPOSURE FOR INTERVENTIONAL RADIOLOGY PROCEDURE BY DR.DEVASHWAR,Insertion 6 FR sheath;  Surgeon: Elam Dutch, MD;  Location: [facility];  Service: Vascular;  Laterality: Right;   ENDARTERECTOMY Right 08/12/2014   Procedure:  CAROTID  EXPOSURE CLOSURE RIGHT NECK, REPAIR RIGHT COMMON CAROTID ARTERY;  Surgeon: Elam Dutch, MD;  Location: [facility];  Service: Vascular;  Laterality: Right;   ENDARTERECTOMY Left 11/18/2014   Procedure: CAROTID EXPOSURE;  Surgeon: Elam Dutch, MD;  Location: [facility];  Service: Vascular;  Laterality: Left;   ENDARTERECTOMY Left 11/18/2014   Procedure: CLOSURE CAROTID;  Surgeon: Elam Dutch, MD;  Location: [facility];  Service: Vascular;  Laterality: Left;   ENDARTERECTOMY Right 08/22/2017   Procedure: EXPOSURE CAROTID ARTERY FOR [place];  Surgeon: Elam Dutch, MD;  [facility];  Service: Vascular;  Laterality: Right;   IR ANGIO INTRA EXTRACRAN SEL INTERNAL CAROTID UNI R MOD SED  08/22/2017   IR ANGIOGRAM FOLLOW UP STUDY  08/22/2017   IR RADIOLOGIST EVAL & MGMT  06/26/2016   IR RADIOLOGIST EVAL & MGMT  05/08/2017   IR RADIOLOGY PERIPHERAL GUIDED IV START  12/03/2017   IR TRANSCATH/EMBOLIZ  08/22/2017   IR US GUIDE VASC ACCESS RIGHT  12/03/2017   LAPAROSCOPY     NECK SURGERY  50 yrs ago   Left side tumor   RADIOLOGY WITH ANESTHESIA N/A 05/25/2014   Procedure: RADIOLOGY WITH ANESTHESIA;  Surgeon: [name], MD;  Location: [facility];  Service: Radiology;  Laterality: N/A;   RADIOLOGY WITH ANESTHESIA N/A 08/12/2014   Procedure: RADIOLOGY  WITH ANESTHESIA;  Surgeon: [name], MD;  Location: [facility];  Service: Radiology;  Laterality: N/A;   RADIOLOGY WITH ANESTHESIA N/A 03/15/2015   Procedure: MRI OF BRAIN WITHOUT CONTRAST;  Surgeon: Medication Radiologist, MD;  Location: [facility];  Service: Radiology;  Laterality: N/A;  DR. TAT/MRI   US ECHOCARDIOGRAPHY  04-26-2009   EF 65-70%   VESICOVAGINAL FISTULA CLOSURE W/ TAH  25 yrs ago   WOUND EXPLORATION Right 08/22/2017   Procedure: REMOVAL OF RIGHT COMMON CAROTID SHEATH AND WOUND CLOSURE;  Surgeon: Rosetta Posner, MD;  Location: [facility];  Service: Vascular;  Laterality: Right;    There were no vitals filed for this visit.    Subjective Assessment - 10/26/20 0949     Subjective Pt had a fall on 03-09-20 - states her shoe got  caught on a throw rug causing her to trip and fall- sustained a nondisplaced fracture of her left fibula; pt is using a RW for assistance with amb. - has severe edema in bil. LE's due to chronic venous insufficiency; pt states she tried the wrappings and had difficulty with Rt one staying in place - also she is unable to independently wrap her legs and needs someone to do it for her, which she does not have, as she lives alone.  Pt is wearing cast shoes on bil. feet because she is unable to don shoes due to the swelling.    Patient Stated Goals improve walking and balance; "get the swelling down in my legs"    Currently in Pain? No/denies                [facility] PT Assessment - 10/27/20 0001       Assessment   Medical Diagnosis s/p Lt distal nondisplaced fibular fracture; chronic venous insufficiency bil. LE's    Referring Provider (PT) Dr. [name]    Onset Date/Surgical Date 03/09/20    Hand Dominance Right    Prior Therapy pt has been seen several times at this facility with most recent admission 01-25-17 - 03-19-17      Precautions   Precautions Fall;Other (comment)   severe edema in bil. feet     Balance Screen   Has the patient fallen in the  past 6 months Yes    How many times? 1    Has the patient had a decrease in activity level because of a fear of falling?  Yes    Is the patient reluctant to leave their home because of a fear of falling?  No      Home Environment   Living Environment Other (Comment)    Living Arrangements Alone    Type of Home Other(Comment)   Retirement community -  [facility]     Prior Function   Level of Independence Independent with basic ADLs    Leisure shopping      ROM / Strength   AROM / PROM / Strength Strength      Strength   Overall Strength Deficits    Overall Strength Comments LLE weaker than RLE due to h/o CVA in April 2016    Strength Assessment Site Knee;Hip;Ankle    Right/Left Hip Right;Left    Right Hip Flexion 4/5    Left Hip Flexion 3+/5    Right/Left Knee Right;Left    Right Knee Extension 4-/5    Left Knee Flexion --   c/o pain Rt knee with resistance   Left Knee Extension 4/5    Right/Left Ankle Right;Left    Right Ankle Dorsiflexion 4/5    Left Ankle Dorsiflexion 3-/5      Transfers   Transfers Sit to Stand;Stand to Sit    Sit to Stand 6: Modified independent (Device/Increase time)    Comments needs UE support from standard chair      Ambulation/Gait   Ambulation/Gait Yes    Ambulation/Gait Assistance 6: Modified independent (Device/Increase time)    Ambulation Distance (Feet) 75 Feet    Assistive device Rolling walker    Gait Pattern Step-through pattern;Decreased dorsiflexion - left;Decreased hip/knee flexion - left   ROM limited by severe edema   Ambulation Surface Level;Indoor    Gait velocity 26.03 secs = 1.26 ft/sec      Standardized Balance Assessment   Standardized Balance Assessment Timed Up and Go Test      Timed Up and Go Test   TUG Normal TUG    Normal TUG (seconds) 32.29   with RW                       Objective measurements completed on examination: See above findings.                PT Education - 10/27/20  1228     Education Details eval results; need to regularly perform ankle pumps to increase circulation in bil. LE's    Person(s) Educated Patient    Methods Explanation;Demonstration    Comprehension Verbalized understanding;Returned demonstration              PT Short Term Goals - 10/27/20 1242       PT SHORT TERM GOAL #1   Title Increase gait velocity to >/= 1.6 ft/sec with RW for incr. gait efficiency.    Baseline 26.03 secs = 1.26 ft/sec with RW    Time 4    Period Weeks    Status New    Target Date 11/26/20      PT SHORT TERM GOAL #2   Title Pt will improve TUG score to </= 28 secs with RW to demo improved functional mobility.    Baseline 32.29 secs with RW    Time 4    Period Weeks    Status New    Target Date 11/26/20      PT SHORT TERM GOAL #3   Title Pt will amb. 7' with SPC with CGA for increased safety with household and community mobility.    Time 4    Period Weeks    Status New    Target Date 11/26/20      PT SHORT TERM GOAL #4   Title Independent in HEP for balance, ROM and strengthening.    Time 4  Period Weeks    Status New    Target Date 11/26/20               PT Long Term Goals - 10/27/20 1247       PT LONG TERM GOAL #1   Title Independent in updated HEP for bil. strengthening and balance exercises.    Time 8    Period Weeks    Status New    Target Date 12/24/20      PT LONG TERM GOAL #2   Title Pt will report ability to transfer in/out of her car in order to be able to return to driving.    Time 8    Period Weeks    Status New    Target Date 12/24/20      PT LONG TERM GOAL #3   Title Improve TUG score to </= 23 secs with RW to demo improved mobility.    Time 8    Period Weeks    Status New    Target Date 12/24/20      PT LONG TERM GOAL #4   Title Incr. gait velocity to >/= 2.0 ft/sec with RW for incr. gait efficiency.    Baseline 26.03 secs = 1.26 ft/sec with RW on 10-26-20    Time 8    Period Weeks    Status New     Target Date 12/24/20      PT LONG TERM GOAL #5   Title Amb. 41' with SPC with SBA on flat, even surface for incr. community accessibility.    Time 8    Period Weeks    Status New    Target Date 12/24/20                    Plan - 10/27/20 1231     Clinical Impression Statement Pt is a 75 yr old lady s/p Lt distal nondisplaced fibular fracture due to fall on 03-09-20.  Pt presents with severe edema in bil. LE's due to chronic venous insufficiency. Pt is unable to don shoes due to edema in bil. feet and ankles, therefore, she is currently wearing cast shoes.  Pt is using a RW for assistance with ambulation but has decreased gait velocity at 1.26 ft/sec and is at high risk for fall with TUG score 32.29 secs with use of RW.  Active and PROM in bil. knees and ankles is limited by edema secondary to chronic venous insufficiency in bil. LE's.  Pt requires bil. UE support for sit to stand transfers.  She is able to stand unsupported without UE support for approx. 1-2".  Pt will benefit from PT to address gait and balance deficits and LE strength and ROM deficits.    Personal Factors and Comorbidities Comorbidity 2;Past/Current Experience;Time since onset of injury/illness/exacerbation;Transportation    Comorbidities h/o CVA x 2 - April 2016 and Oct. 2010:  TIA  April 2011; Ventricular hypertrophy 04/2009;  COPD:  CAD:  bil. LE edema due to chronic venous insufficiency, HTN, cerebral aneurysm    Examination-Activity Limitations Locomotion Level;Transfers;Bend;Lift;Stand;Stairs;Squat;Caring for Others    Examination-Participation Restrictions Driving;Cleaning;Community Activity;Shop;Meal Prep    Stability/Clinical Decision Making Evolving/Moderate complexity    Clinical Decision Making Moderate    Rehab Potential Good    PT Frequency 1x / week    PT Duration 8 weeks    PT Treatment/Interventions ADLs/Self Care Home Management;DME Instruction;Gait training;Stair training;Therapeutic  activities;Therapeutic exercise;Neuromuscular re-education;Patient/family education    PT Next Visit Plan issue balance and  strengthening HEP; gait train with SPC; SciFit    Consulted and Agree with Plan of Care Patient             Patient will benefit from skilled therapeutic intervention in order to improve the following deficits and impairments:  Difficulty walking, Decreased activity tolerance, Decreased balance, Decreased range of motion, Decreased strength, Increased edema, Impaired flexibility  Visit Diagnosis: Other abnormalities of gait and mobility - Plan: PT plan of care cert/re-cert  Other symptoms and signs involving the musculoskeletal system - Plan: PT plan of care cert/re-cert  Unsteadiness on feet - Plan: PT plan of care cert/re-cert  Muscle weakness (generalized) - Plan: PT plan of care cert/re-cert     Problem List Patient Active Problem List   Diagnosis Date Noted   Edema of both lower extremities due to peripheral venous insufficiency 07/13/2020   Acute cholecystitis 03/07/2016   Small vessel disease, cerebrovascular 05/31/2015   Aneurysm, cerebral, nonruptured 05/31/2015   Dizziness and giddiness 05/31/2015   Altered mental status    Acute CVA (cerebrovascular accident) ([place]) 03/15/2015   Dysphasia 03/12/2015   Thrombocytopenia ([place]) 03/12/2015   Hypokalemia    Endotracheally intubated    Essential hypertension    Intracranial aneurysm 11/18/2014   Respiratory failure ([place])    Cerebral aneurysm    Brain aneurysm    Aphasia    Palmar erythema    CVA (cerebral infarction) 05/02/2014   Aneurysm ([place]) 05/02/2014   Expressive aphasia 05/02/2014   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 [place], [place], [address] Phone: [phone]   Fax:  [phone]  Name: Jeanne Lawson MRN: [id number] Date of Birth: [birth year]

## 2020-11-02 ENCOUNTER — Ambulatory Visit: Payer: Medicare Other | Admitting: Physical Therapy

## 2020-11-02 ENCOUNTER — Other Ambulatory Visit: Payer: Self-pay

## 2020-11-02 DIAGNOSIS — R2681 Unsteadiness on feet: Secondary | ICD-10-CM

## 2020-11-02 DIAGNOSIS — R2689 Other abnormalities of gait and mobility: Secondary | ICD-10-CM | POA: Diagnosis not present

## 2020-11-03 ENCOUNTER — Encounter: Payer: Self-pay | Admitting: Physical Therapy

## 2020-11-03 NOTE — Therapy (Signed)
[place] [address] [place], [place], [address] Phone: [phone]   Fax:  [phone]  Physical Therapy Treatment  Patient Details  Name: Jeanne Lawson MRN: [id number] Date of Birth: [birth year] Referring Provider (PT): Dr. 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    COPD (chronic obstructive pulmonary disease) ([place])    Coronary artery disease 1996   Known with prior mild lesion of LAD demonstrated by Cardiac Catheterization in 1996   Edema of foot    She has a history of chronic edema of the left dated back to age 55 when she suufered severe frostbite playing  on the snow as a child   Hypertension    Lower extremity edema    PAC (premature atrial contraction)    Pancreatitis    x2   PONV (postoperative nausea and vomiting)    problems waking up last time 4/16 only time   Saccular aneurysm    She also has 2 known which were stable between the MRA of October2010 and the MRA  of April 2011.   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 Has normal EF.    Past Surgical History:  Procedure Laterality Date    ABDOMINAL HYSTERECTOMY     APPENDECTOMY     CARDIAC CATHETERIZATION  1996   Mild CAD with vasospasm   CARDIOVASCULAR STRESS TEST  12-02-2001   EF 70%   CHOLECYSTECTOMY N/A 03/09/2016   Procedure: LAPAROSCOPIC CHOLECYSTECTOMY WITH INTRAOPERATIVE CHOLANGIOGRAM;  Surgeon: Georganna Skeans, MD;  Location: [place];  Service: General;  Laterality: N/A;   ENDARTERECTOMY Right 08/12/2014   Procedure: RIGHT  COMMON CAROTID ARTERY EXPOSURE FOR INTERVENTIONAL RADIOLOGY PROCEDURE BY DR.DEVASHWAR,Insertion 6 FR sheath;  Surgeon: Elam Dutch, MD;  Location: [place];  Service: Vascular;  Laterality: Right;   ENDARTERECTOMY Right 08/12/2014   Procedure:  CAROTID  EXPOSURE CLOSURE RIGHT NECK, REPAIR RIGHT COMMON CAROTID ARTERY;  Surgeon: Elam Dutch, MD;  Location: [place];  Service: Vascular;  Laterality: Right;   ENDARTERECTOMY Left 11/18/2014   Procedure: CAROTID EXPOSURE;  Surgeon: Elam Dutch, MD;  Location: [place];  Service: Vascular;  Laterality: Left;   ENDARTERECTOMY Left 11/18/2014   Procedure: CLOSURE CAROTID;  Surgeon: Elam Dutch, MD;  Location: [place];  Service: Vascular;  Laterality: Left;   ENDARTERECTOMY Right 08/22/2017   Procedure: EXPOSURE CAROTID ARTERY FOR [place];  Surgeon: Elam Dutch, MD;  [facility];  Service: Vascular;  Laterality: Right;   IR ANGIO INTRA EXTRACRAN SEL INTERNAL CAROTID UNI R MOD SED  08/22/2017   IR ANGIOGRAM FOLLOW UP STUDY  08/22/2017   IR RADIOLOGIST EVAL & MGMT  06/26/2016   IR RADIOLOGIST EVAL & MGMT  05/08/2017   IR RADIOLOGY PERIPHERAL GUIDED IV START  12/03/2017   IR TRANSCATH/EMBOLIZ  08/22/2017   IR US GUIDE VASC ACCESS RIGHT  12/03/2017   LAPAROSCOPY     NECK SURGERY  50 yrs ago   Left side tumor   RADIOLOGY WITH ANESTHESIA N/A 05/25/2014   Procedure: RADIOLOGY WITH ANESTHESIA;  Surgeon: [name], MD;  Location: [place];  Service: Radiology;  Laterality: N/A;   RADIOLOGY WITH ANESTHESIA N/A 08/12/2014   Procedure: RADIOLOGY  WITH ANESTHESIA;  Surgeon: [name], MD;  Location: [place];  Service: Radiology;  Laterality: N/A;   RADIOLOGY WITH ANESTHESIA N/A 03/15/2015   Procedure: MRI OF BRAIN WITHOUT CONTRAST;  Surgeon: Medication Radiologist, MD;  Location: [place];  Service: Radiology;  Laterality: N/A;  DR. TAT/MRI   US ECHOCARDIOGRAPHY  04-26-2009   EF 65-70%   VESICOVAGINAL FISTULA CLOSURE W/ TAH  25 yrs ago   WOUND EXPLORATION Right 08/22/2017   Procedure: REMOVAL OF RIGHT COMMON CAROTID SHEATH AND WOUND CLOSURE;  Surgeon: Rosetta Posner, MD;  Location: [place];  Service: Vascular;  Laterality: Right;    There were no vitals filed for this visit.   Subjective Assessment - 11/02/20 1107     Subjective Pt reports she has been walking more with her cane in her apartment - pt using RW at today's session.  Pt states she is trying to work out transportation with the facility in which she lives to come 2x/wk rather than only once a week    Patient Stated Goals improve walking and balance; "get the swelling down in my legs"    Currently in Pain? No/denies                               [facility] Adult PT Treatment/Exercise - 11/03/20 0001       Transfers   Transfers Sit to Stand;Stand to Sit    Sit to Stand 5: Supervision    Comments no UE support used from hi/lo mat table      Ambulation/Gait   Ambulation/Gait Yes    Ambulation/Gait Assistance 5: Supervision    Ambulation Distance (Feet) 115 Feet    Assistive device Straight cane    Gait Pattern Step-through pattern;Decreased dorsiflexion - left;Decreased hip/knee flexion - left   ROM limited by severe edema   Ambulation Surface Level;Indoor      Standardized Balance Assessment   Standardized Balance Assessment Timed Up and Go Test      Timed Up and Go Test   TUG Normal TUG    Normal TUG (seconds) 21.32   with SPC     Exercises   Exercises Knee/Hip      Knee/Hip Exercises: Aerobic   Recumbent Bike SciFit level 2.0 x 5" with UE's &  LE's      Knee/Hip Exercises: Standing   Forward Step Up Both;1 set;10 reps;Hand Hold: 2;Step Height: 6"    Other Standing Knee Exercises tap ups 5 reps each foot to 1st step with UE support prn on handrails                 Balance Exercises - 11/03/20 0001  Balance Exercises: Standing   Marching Solid surface;Upper extremity assist 2;10 reps;Static   used back of chair for support   Other Standing Exercises Pt performed alternating forward, back and side kicks 10 reps each leg with UE support                  PT Short Term Goals - 11/02/20 1134       PT SHORT TERM GOAL #1   Title Increase gait velocity to >/= 1.6 ft/sec with RW for incr. gait efficiency.    Baseline 26.03 secs = 1.26 ft/sec with RW    Time 4    Period Weeks    Status New    Target Date 11/26/20      PT SHORT TERM GOAL #2   Title Pt will improve TUG score to </= 28 secs with RW to demo improved functional mobility.    Baseline 32.29 secs with RW    Time 4    Period Weeks    Status New    Target Date 11/26/20      PT SHORT TERM GOAL #3   Title Pt will amb. 48' with SPC with CGA for increased safety with household and community mobility.    Time 4    Period Weeks    Status New    Target Date 11/26/20      PT SHORT TERM GOAL #4   Title Independent in HEP for balance, ROM and strengthening.    Time 4    Period Weeks    Status New    Target Date 11/26/20               PT Long Term Goals - 11/03/20 1509       PT LONG TERM GOAL #1   Title Independent in updated HEP for bil. strengthening and balance exercises.    Time 8    Period Weeks    Status New      PT LONG TERM GOAL #2   Title Pt will report ability to transfer in/out of her car in order to be able to return to driving.    Time 8    Period Weeks    Status New      PT LONG TERM GOAL #3   Title Improve TUG score to </= 23 secs with RW to demo improved mobility.    Time 8    Period Weeks    Status New       PT LONG TERM GOAL #4   Title Incr. gait velocity to >/= 2.0 ft/sec with RW for incr. gait efficiency.    Baseline 26.03 secs = 1.26 ft/sec with RW on 10-26-20    Time 8    Period Weeks    Status New      PT LONG TERM GOAL #5   Title Amb. 14' with SPC with SBA on flat, even surface for incr. community accessibility.    Time 8    Period Weeks    Status New                   Plan - 11/03/20 1504     Clinical Impression Statement Skilled PT session focused on gait training with SPC and balance training with gradual decrease in UE support.  Pt needs UE support for safety due to SLS deficits on each leg.  Pt able to perform SciFit exercise for LE strengthening and ROM.  Pt did well with gait training with SPC.  Edema in each LE limits and impacts AROM & PROM in bil. knee and ankle.  Cont with POC.    Personal Factors and Comorbidities Comorbidity 2;Past/Current Experience;Time since onset of injury/illness/exacerbation;Transportation    Comorbidities h/o CVA x 2 - April 2016 and Oct. 2010:  TIA  April 2011; Ventricular hypertrophy 04/2009;  COPD:  CAD:  bil. LE edema due to chronic venous insufficiency, HTN, cerebral aneurysm    Examination-Activity Limitations Locomotion Level;Transfers;Bend;Lift;Stand;Stairs;Squat;Caring for Others    Examination-Participation Restrictions Driving;Cleaning;Community Activity;Shop;Meal Prep    Stability/Clinical Decision Making Evolving/Moderate complexity    Rehab Potential Good    PT Frequency 1x / week    PT Duration 8 weeks    PT Treatment/Interventions ADLs/Self Care Home Management;DME Instruction;Gait training;Stair training;Therapeutic activities;Therapeutic exercise;Neuromuscular re-education;Patient/family education    PT Next Visit Plan issue balance and strengthening HEP; gait train with SPC; SciFit    Consulted and Agree with Plan of Care Patient             Patient will benefit from skilled therapeutic intervention in order to  improve the following deficits and impairments:  Difficulty walking, Decreased activity tolerance, Decreased balance, Decreased range of motion, Decreased strength, Increased edema, Impaired flexibility  Visit Diagnosis: Other abnormalities of gait and mobility  Unsteadiness on feet     Problem List Patient Active Problem List   Diagnosis Date Noted   Edema of both lower extremities due to peripheral venous insufficiency 07/13/2020   Acute cholecystitis 03/07/2016   Small vessel disease, cerebrovascular 05/31/2015   Aneurysm, cerebral, nonruptured 05/31/2015   Dizziness and giddiness 05/31/2015   Altered mental status    Acute CVA (cerebrovascular accident) ([place]) 03/15/2015   Dysphasia 03/12/2015   Thrombocytopenia ([place]) 03/12/2015   Hypokalemia    Endotracheally intubated    Essential hypertension    Intracranial aneurysm 11/18/2014   Respiratory failure ([place])    Cerebral aneurysm    Brain aneurysm    Aphasia    Palmar erythema    CVA (cerebral infarction) 05/02/2014   Aneurysm ([place]) 05/02/2014   Expressive aphasia 05/02/2014   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

## 2020-11-16 ENCOUNTER — Other Ambulatory Visit: Payer: Self-pay

## 2020-11-16 ENCOUNTER — Ambulatory Visit: Payer: Medicare Other | Admitting: Physical Therapy

## 2020-11-16 DIAGNOSIS — R2689 Other abnormalities of gait and mobility: Secondary | ICD-10-CM

## 2020-11-16 DIAGNOSIS — R2681 Unsteadiness on feet: Secondary | ICD-10-CM

## 2020-11-16 DIAGNOSIS — M6281 Muscle weakness (generalized): Secondary | ICD-10-CM

## 2020-11-17 NOTE — Therapy (Signed)
[facility] [place] [address] [place], [place], [address] Phone: [phone]   Fax:  [phone]  Physical Therapy Treatment  Patient Details  Name: Jeanne Lawson MRN: [id number] Date of Birth: 1945-05-19 Referring Provider (PT): Dr. 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    COPD (chronic obstructive pulmonary disease) ([place])    Coronary artery disease 1996   Known with prior mild lesion of LAD demonstrated by Cardiac Catheterization in 1996   Edema of foot    She has a history of chronic edema of the left dated back to age 21 when she suufered severe frostbite playing  on the snow as a child   Hypertension    Lower extremity edema    PAC (premature atrial contraction)    Pancreatitis    x2   PONV (postoperative nausea and vomiting)    problems waking up last time 4/16 only time   Saccular aneurysm    She also has 2 known which were stable between the MRA of October2010 and the MRA  of April 2011.   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 Has normal EF.    Past Surgical History:  Procedure Laterality Date    ABDOMINAL HYSTERECTOMY     APPENDECTOMY     CARDIAC CATHETERIZATION  1996   Mild CAD with vasospasm   CARDIOVASCULAR STRESS TEST  12-02-2001   EF 70%   CHOLECYSTECTOMY N/A 03/09/2016   Procedure: LAPAROSCOPIC CHOLECYSTECTOMY WITH INTRAOPERATIVE CHOLANGIOGRAM;  Surgeon: Georganna Skeans, MD;  Location: [place];  Service: General;  Laterality: N/A;   ENDARTERECTOMY Right 08/12/2014   Procedure: RIGHT  COMMON CAROTID ARTERY EXPOSURE FOR INTERVENTIONAL RADIOLOGY PROCEDURE BY DR.DEVASHWAR,Insertion 6 FR sheath;  Surgeon: Elam Dutch, MD;  Location: [place];  Service: Vascular;  Laterality: Right;   ENDARTERECTOMY Right 08/12/2014   Procedure:  CAROTID  EXPOSURE CLOSURE RIGHT NECK, REPAIR RIGHT COMMON CAROTID ARTERY;  Surgeon: Elam Dutch, MD;  Location: [place];  Service: Vascular;  Laterality: Right;   ENDARTERECTOMY Left 11/18/2014   Procedure: CAROTID EXPOSURE;  Surgeon: Elam Dutch, MD;  Location: [place];  Service: Vascular;  Laterality: Left;   ENDARTERECTOMY Left 11/18/2014   Procedure: CLOSURE CAROTID;  Surgeon: Elam Dutch, MD;  Location: [place];  Service: Vascular;  Laterality: Left;   ENDARTERECTOMY Right 08/22/2017   Procedure: EXPOSURE CAROTID ARTERY FOR [place];  Surgeon: Elam Dutch, MD;  [facility];  Service: Vascular;  Laterality: Right;   IR ANGIO INTRA EXTRACRAN SEL INTERNAL CAROTID UNI R MOD SED  08/22/2017   IR ANGIOGRAM FOLLOW UP STUDY  08/22/2017   IR RADIOLOGIST EVAL & MGMT  06/26/2016   IR RADIOLOGIST EVAL & MGMT  05/08/2017   IR RADIOLOGY PERIPHERAL GUIDED IV START  12/03/2017   IR TRANSCATH/EMBOLIZ  08/22/2017   IR US GUIDE VASC ACCESS RIGHT  12/03/2017   LAPAROSCOPY     NECK SURGERY  50 yrs ago   Left side tumor   RADIOLOGY WITH ANESTHESIA N/A 05/25/2014   Procedure: RADIOLOGY WITH ANESTHESIA;  Surgeon: [name], MD;  Location: [place];  Service: Radiology;  Laterality: N/A;   RADIOLOGY WITH ANESTHESIA N/A 08/12/2014   Procedure: RADIOLOGY  WITH ANESTHESIA;  Surgeon: [name], MD;  Location: [place];  Service: Radiology;  Laterality: N/A;   RADIOLOGY WITH ANESTHESIA N/A 03/15/2015   Procedure: MRI OF BRAIN WITHOUT CONTRAST;  Surgeon: Medication Radiologist, MD;  Location: [place];  Service: Radiology;  Laterality: N/A;  DR. TAT/MRI   US ECHOCARDIOGRAPHY  04-26-2009   EF 65-70%   VESICOVAGINAL FISTULA CLOSURE W/ TAH  25 yrs ago   WOUND EXPLORATION Right 08/22/2017   Procedure: REMOVAL OF RIGHT COMMON CAROTID SHEATH AND WOUND CLOSURE;  Surgeon: Rosetta Posner, MD;  Location: [place];  Service: Vascular;  Laterality: Right;    There were no vitals filed for this visit.   Subjective Assessment - 11/16/20 0938     Subjective Pt reports she is using her cane almost all the time now in her apartment; is using RW around living facility; states she was able to get her shoes on last week, but they are too tight but at least she is able to get them on - she knows that the swelling is decreasing    Patient Stated Goals improve walking and balance; "get the swelling down in my legs"    Currently in Pain? No/denies                               [facility] Adult PT Treatment/Exercise - 11/17/20 0001       Transfers   Transfers Sit to Stand;Stand to Sit    Sit to Stand 5: Supervision    Number of Reps Other reps (comment)   3   Comments RUE support used      Ambulation/Gait   Ambulation/Gait Yes    Ambulation/Gait Assistance 5: Supervision    Ambulation Distance (Feet) 115 Feet    Assistive device Rolling walker    Gait Pattern Step-through pattern;Decreased dorsiflexion - left;Decreased hip/knee flexion - left   ROM limited by severe edema   Ambulation Surface Level;Indoor    Stairs Yes    Stairs Assistance 5: Supervision    Stair Management Technique Two rails   then used 1 rail to [place]   Number of Stairs 4   3 reps = 12 steps total   Height of Stairs 6      Knee/Hip  Exercises: Aerobic   Recumbent Bike SciFit level 2.0 x 5" with UE's & LE's      Knee/Hip Exercises: Standing   Heel Raises Both;1 set;10 reps    Hip Flexion Stengthening;Right;Left;1 set;10 reps;Knee bent;Knee straight   2#   Hip Abduction Stengthening;Right;Left;1 set;10 reps   2#   Hip Extension Stengthening;Right;Left;1 set;10 reps  Forward Step Up Both;1 set;10 reps;Hand Hold: 2;Step Height: 6"    Other Standing Knee Exercises tap ups 5 reps each foot to 1st step with UE support prn on handrails                 Balance Exercises - 11/17/20 0001       Balance Exercises: Standing   Marching Solid surface;Upper extremity assist 2;10 reps;Static    Other Standing Exercises pt performed stepping strategy - stepping forward/back, out/in, and back/forward 5 reps each with as less UE support prn with CGA                  PT Short Term Goals - 11/17/20 1005       PT SHORT TERM GOAL #1   Title Increase gait velocity to >/= 1.6 ft/sec with RW for incr. gait efficiency.    Baseline 26.03 secs = 1.26 ft/sec with RW    Time 4    Period Weeks    Status New    Target Date 11/26/20      PT SHORT TERM GOAL #2   Title Pt will improve TUG score to </= 28 secs with RW to demo improved functional mobility.    Baseline 32.29 secs with RW    Time 4    Period Weeks    Status New    Target Date 11/26/20      PT SHORT TERM GOAL #3   Title Pt will amb. 85' with SPC with CGA for increased safety with household and community mobility.    Time 4    Period Weeks    Status New    Target Date 11/26/20      PT SHORT TERM GOAL #4   Title Independent in HEP for balance, ROM and strengthening.    Time 4    Period Weeks    Status New    Target Date 11/26/20               PT Long Term Goals - 11/17/20 1005       PT LONG TERM GOAL #1   Title Independent in updated HEP for bil. strengthening and balance exercises.    Time 8    Period Weeks    Status New      PT LONG  TERM GOAL #2   Title Pt will report ability to transfer in/out of her car in order to be able to return to driving.    Time 8    Period Weeks    Status New      PT LONG TERM GOAL #3   Title Improve TUG score to </= 23 secs with RW to demo improved mobility.    Time 8    Period Weeks    Status New      PT LONG TERM GOAL #4   Title Incr. gait velocity to >/= 2.0 ft/sec with RW for incr. gait efficiency.    Baseline 26.03 secs = 1.26 ft/sec with RW on 10-26-20    Time 8    Period Weeks    Status New      PT LONG TERM GOAL #5   Title Amb. 62' with SPC with SBA on flat, even surface for incr. community accessibility.    Time 8    Period Weeks    Status New                   Plan - 11/17/20 1002  Clinical Impression Statement Pt able to perform PRE's for bil. hip and knee strengthening with 2# weight with pt reporting moderate difficulty with these exercises.  Significant edema in bil. LE's continues to limit AROM of knee and ankle ROM. Pt unable to wear regular shoes at this time due to edema but pt reports she is now able to don shoes at times, indicative of decreased edema in LE's.  Cont with POC.    Personal Factors and Comorbidities Comorbidity 2;Past/Current Experience;Time since onset of injury/illness/exacerbation;Transportation    Comorbidities h/o CVA x 2 - April 2016 and Oct. 2010:  TIA  April 2011; Ventricular hypertrophy 04/2009;  COPD:  CAD:  bil. LE edema due to chronic venous insufficiency, HTN, cerebral aneurysm    Examination-Activity Limitations Locomotion Level;Transfers;Bend;Lift;Stand;Stairs;Squat;Caring for Others    Examination-Participation Restrictions Driving;Cleaning;Community Activity;Shop;Meal Prep    Stability/Clinical Decision Making Evolving/Moderate complexity    Rehab Potential Good    PT Frequency 1x / week    PT Duration 8 weeks    PT Treatment/Interventions ADLs/Self Care Home Management;DME Instruction;Gait training;Stair  training;Therapeutic activities;Therapeutic exercise;Neuromuscular re-education;Patient/family education    PT Next Visit Plan Check STG's; issue balance and strengthening HEP; gait train with SPC; SciFit    Consulted and Agree with Plan of Care Patient             Patient will benefit from skilled therapeutic intervention in order to improve the following deficits and impairments:  Difficulty walking, Decreased activity tolerance, Decreased balance, Decreased range of motion, Decreased strength, Increased edema, Impaired flexibility  Visit Diagnosis: Other abnormalities of gait and mobility  Unsteadiness on feet  Muscle weakness (generalized)     Problem List Patient Active Problem List   Diagnosis Date Noted   Edema of both lower extremities due to peripheral venous insufficiency 07/13/2020   Acute cholecystitis 03/07/2016   Small vessel disease, cerebrovascular 05/31/2015   Aneurysm, cerebral, nonruptured 05/31/2015   Dizziness and giddiness 05/31/2015   Altered mental status    Acute CVA (cerebrovascular accident) ([place]) 03/15/2015   Dysphasia 03/12/2015   Thrombocytopenia ([place]) 03/12/2015   Hypokalemia    Endotracheally intubated    Essential hypertension    Intracranial aneurysm 11/18/2014   Respiratory failure ([place])    Cerebral aneurysm    Brain aneurysm    Aphasia    Palmar erythema    CVA (cerebral infarction) 05/02/2014   Aneurysm ([place]) 05/02/2014   Expressive aphasia 05/02/2014   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   Tobacco abuse 10/24/2010   TIA (transient ischemic attack)    Hypertension    Ventricular hypertrophy    CVA 02/23/2010   STRESS FRACTURE, FOOT 02/22/2010    Alda Lea, PT 11/17/2020, 10:07 AM  [place] [address] [place], [place], [address] Phone: [phone]   Fax:  [phone]  Name: ADELLYN CAPEK MRN:  [id number] Date of Birth: May 04, 1945

## 2020-11-23 ENCOUNTER — Other Ambulatory Visit (HOSPITAL_COMMUNITY): Payer: Self-pay | Admitting: Interventional Radiology

## 2020-11-23 ENCOUNTER — Telehealth (HOSPITAL_COMMUNITY): Payer: Self-pay

## 2020-11-23 ENCOUNTER — Ambulatory Visit: Payer: Medicare Other | Attending: Internal Medicine | Admitting: Physical Therapy

## 2020-11-23 ENCOUNTER — Encounter: Payer: Self-pay | Admitting: Physical Therapy

## 2020-11-23 ENCOUNTER — Other Ambulatory Visit: Payer: Self-pay

## 2020-11-23 DIAGNOSIS — M6281 Muscle weakness (generalized): Secondary | ICD-10-CM | POA: Insufficient documentation

## 2020-11-23 DIAGNOSIS — I671 Cerebral aneurysm, nonruptured: Secondary | ICD-10-CM

## 2020-11-23 DIAGNOSIS — R2689 Other abnormalities of gait and mobility: Secondary | ICD-10-CM | POA: Diagnosis present

## 2020-11-23 DIAGNOSIS — R2681 Unsteadiness on feet: Secondary | ICD-10-CM | POA: Diagnosis present

## 2020-11-23 NOTE — Therapy (Signed)
[facility] [address] [place], [place], [address] Phone: [phone]   Fax:  [phone]  Physical Therapy Treatment  Patient Details  Name: Jeanne Lawson MRN: [id number] Date of Birth: 07-21-1945 Referring Provider (PT): Dr. [name]   Encounter Date: 11/23/2020   PT End of Session - 11/23/20 1954     Visit Number 4    Number of Visits 9    Date for PT Re-Evaluation 12/24/20    Authorization Type UHC Medicare    Authorization Time Period 10-26-20 - 01-22-21    PT Start Time 0935    PT Stop Time 1022    PT Time Calculation (min) 47 min    Equipment Utilized During Treatment Gait belt    Activity Tolerance Patient tolerated treatment well    Behavior During Therapy [facility] for tasks assessed/performed             Past Medical History:  Diagnosis Date   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    COPD (chronic obstructive pulmonary disease) ([place])    Coronary artery disease 1996   Known with prior mild lesion of LAD demonstrated by Cardiac Catheterization in 1996   Edema of foot    She has a history of chronic edema of the left dated back to age 26 when she suufered severe frostbite playing  on the snow as a child   Hypertension    Lower extremity edema    PAC (premature atrial contraction)    Pancreatitis    x2   PONV (postoperative nausea and vomiting)    problems waking up last time 4/16 only time   Saccular aneurysm    She also has 2 known which were stable between the MRA of October2010 and the MRA  of April 2011.   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 Has normal EF.    Past  Surgical History:  Procedure Laterality Date   ABDOMINAL HYSTERECTOMY     APPENDECTOMY     CARDIAC CATHETERIZATION  1996   Mild CAD with vasospasm   CARDIOVASCULAR STRESS TEST  12-02-2001   EF 70%   CHOLECYSTECTOMY N/A 03/09/2016   Procedure: LAPAROSCOPIC CHOLECYSTECTOMY WITH INTRAOPERATIVE CHOLANGIOGRAM;  Surgeon: Georganna Skeans, MD;  Location: [place];  Service: General;  Laterality: N/A;   ENDARTERECTOMY Right 08/12/2014   Procedure: RIGHT  COMMON CAROTID ARTERY EXPOSURE FOR INTERVENTIONAL RADIOLOGY PROCEDURE BY DR.DEVASHWAR,Insertion 6 FR sheath;  Surgeon: Elam Dutch, MD;  Location: [place];  Service: Vascular;  Laterality: Right;   ENDARTERECTOMY Right 08/12/2014   Procedure:  CAROTID  EXPOSURE CLOSURE RIGHT NECK, REPAIR RIGHT COMMON CAROTID ARTERY;  Surgeon: Elam Dutch, MD;  Location: [place];  Service: Vascular;  Laterality: Right;   ENDARTERECTOMY Left 11/18/2014   Procedure: CAROTID EXPOSURE;  Surgeon: Elam Dutch, MD;  Location: [place];  Service: Vascular;  Laterality: Left;   ENDARTERECTOMY Left 11/18/2014   Procedure: CLOSURE CAROTID;  Surgeon: Elam Dutch, MD;  Location: [place];  Service: Vascular;  Laterality: Left;   ENDARTERECTOMY Right 08/22/2017   Procedure: EXPOSURE CAROTID ARTERY FOR DEVESHWAR  SURGERY;  Surgeon: Elam Dutch, MD;  Location: [facility];  Service: Vascular;  Laterality: Right;   IR ANGIO INTRA EXTRACRAN SEL INTERNAL CAROTID UNI R MOD SED  08/22/2017   IR ANGIOGRAM FOLLOW UP STUDY  08/22/2017   IR RADIOLOGIST EVAL & MGMT  06/26/2016   IR RADIOLOGIST EVAL & MGMT  05/08/2017   IR RADIOLOGY PERIPHERAL GUIDED IV START  12/03/2017   IR TRANSCATH/EMBOLIZ  08/22/2017   IR US GUIDE VASC ACCESS RIGHT  12/03/2017   LAPAROSCOPY     NECK SURGERY  50 yrs ago   Left side tumor   RADIOLOGY WITH ANESTHESIA N/A 05/25/2014   Procedure: RADIOLOGY WITH ANESTHESIA;  Surgeon: [name], MD;  Location: [place];  Service: Radiology;  Laterality: N/A;   RADIOLOGY WITH  ANESTHESIA N/A 08/12/2014   Procedure: RADIOLOGY WITH ANESTHESIA;  Surgeon: [name], MD;  Location: [place];  Service: Radiology;  Laterality: N/A;   RADIOLOGY WITH ANESTHESIA N/A 03/15/2015   Procedure: MRI OF BRAIN WITHOUT CONTRAST;  Surgeon: Medication Radiologist, MD;  Location: [place];  Service: Radiology;  Laterality: N/A;  DR. TAT/MRI   US ECHOCARDIOGRAPHY  04-26-2009   EF 65-70%   VESICOVAGINAL FISTULA CLOSURE W/ TAH  25 yrs ago   WOUND EXPLORATION Right 08/22/2017   Procedure: REMOVAL OF RIGHT COMMON CAROTID SHEATH AND WOUND CLOSURE;  Surgeon: Rosetta Posner, MD;  Location: [place];  Service: Vascular;  Laterality: Right;    There were no vitals filed for this visit.   Subjective Assessment - 11/23/20 0938     Subjective Pt reports she thinks the swelling in her legs is decreasing some - her shoes are not as tight fitting.  No changes or problems reported.    Patient Stated Goals improve walking and balance; "get the swelling down in my legs"    Currently in Pain? No/denies                               [facility] Adult PT Treatment/Exercise - 11/23/20 0001       Transfers   Transfers Sit to Stand;Stand to Sit    Sit to Stand 5: Supervision    Number of Reps Other reps (comment)   5 reps   Comments no UE support used - from high/low mat table      Ambulation/Gait   Ambulation/Gait Yes    Ambulation/Gait Assistance 5: Supervision    Ambulation Distance (Feet) 115 Feet    Assistive device Straight cane    Gait Pattern Step-through pattern;Decreased dorsiflexion - left;Decreased hip/knee flexion - left   ROM limited by severe edema   Ambulation Surface Level;Indoor    Gait velocity 26.18 secs = 1.25 ft/sec with RW    Stairs Yes    Stairs Assistance 5: Supervision    Stair Management Technique One rail Left    Number of Stairs 4   3 reps - 12 steps total   Height of Stairs 6    Ramp 4: Min assist   CGA   Curb 4: Min assist   cues for cane placement      Timed Up and Go Test   Normal TUG (seconds) 21.03   with RW     Knee/Hip Exercises: Aerobic   Recumbent Bike SciFit level 2.0 x 5" with UE's & LE's      Knee/Hip Exercises: Standing   Hip Flexion Stengthening;Right;Left;10 reps;Knee bent;Knee straight;2 sets   3#  weight used on each leg   Hip Abduction Stengthening;Right;Left;1 set;10 reps   3#   Hip Extension Stengthening;Right;Left;1 set;10 reps;Knee straight   3# weight used on each leg   Forward Step Up Both;1 set;10 reps;Hand Hold: 2;Step Height: 6"    Other Standing Knee Exercises tap ups 5 reps each foot to 1st step with UE support prn on handrails                       PT Short Term Goals - 11/23/20 0939       PT SHORT TERM GOAL #1   Title Increase gait velocity to >/= 1.6 ft/sec with RW for incr. gait efficiency.    Baseline 26.03 secs = 1.26 ft/sec with RW;     11/24/20  -  26.18 sec= 1.25 ft/sec with RW    Time 4    Period Weeks    Status Not Met    Target Date 11/26/20      PT SHORT TERM GOAL #2   Title Pt will improve TUG score to </= 28 secs with RW to demo improved functional mobility.    Baseline 32.29 secs with RW; 11/23/2020  - 21.03 sec with RW    Time 4    Period Weeks    Status Achieved    Target Date 11/26/20      PT SHORT TERM GOAL #3   Title Pt will amb. 46' with SPC with CGA for increased safety with household and community mobility.    Time 4    Period Weeks    Status Achieved    Target Date 11/26/20      PT SHORT TERM GOAL #4   Title Independent in HEP for balance, ROM and strengthening.    Time 4    Period Weeks    Status Achieved    Target Date 11/26/20               PT Long Term Goals - 11/23/20 1958       PT LONG TERM GOAL #1   Title Independent in updated HEP for bil. strengthening and balance exercises.    Time 8    Period Weeks    Status New      PT LONG TERM GOAL #2   Title Pt will report ability to transfer in/out of her car in order to be able to  return to driving.    Time 8    Period Weeks    Status New      PT LONG TERM GOAL #3   Title Improve TUG score to </= 23 secs with RW to demo improved mobility.    Time 8    Period Weeks    Status New      PT LONG TERM GOAL #4   Title Incr. gait velocity to >/= 2.0 ft/sec with RW for incr. gait efficiency.    Baseline 26.03 secs = 1.26 ft/sec with RW on 10-26-20    Time 8    Period Weeks    Status New      PT LONG TERM GOAL #5   Title Amb. 49' with SPC with SBA on flat, even surface for incr. community accessibility.    Time 8    Period Weeks    Status New                   Plan - 11/23/20 1958     Clinical Impression Statement Pt  has met STG's #2-4:  STG #1 not met as gait velocity remains unchanged at 1.25 ft/sec with use of RW.  Pt able to amb. 115' with SPC with CGA; mobility continues to be limited by edema/lymphedema in bil. LE's with pt unable to wear regular shoes.  Cont with POC.    Personal Factors and Comorbidities Comorbidity 2;Past/Current Experience;Time since onset of injury/illness/exacerbation;Transportation    Comorbidities h/o CVA x 2 - April 2016 and Oct. 2010:  TIA  April 2011; Ventricular hypertrophy 04/2009;  COPD:  CAD:  bil. LE edema due to chronic venous insufficiency, HTN, cerebral aneurysm    Examination-Activity Limitations Locomotion Level;Transfers;Bend;Lift;Stand;Stairs;Squat;Caring for Others    Examination-Participation Restrictions Driving;Cleaning;Community Activity;Shop;Meal Prep    Stability/Clinical Decision Making Evolving/Moderate complexity    Rehab Potential Good    PT Frequency 1x / week    PT Duration 8 weeks    PT Treatment/Interventions ADLs/Self Care Home Management;DME Instruction;Gait training;Stair training;Therapeutic activities;Therapeutic exercise;Neuromuscular re-education;Patient/family education    PT Next Visit Plan Cont with LE strengthening:  gait train with [place]; SciFit    Consulted and Agree with Plan of Care  Patient             Patient will benefit from skilled therapeutic intervention in order to improve the following deficits and impairments:  Difficulty walking, Decreased activity tolerance, Decreased balance, Decreased range of motion, Decreased strength, Increased edema, Impaired flexibility  Visit Diagnosis: Other abnormalities of gait and mobility  Unsteadiness on feet  Muscle weakness (generalized)     Problem List Patient Active Problem List   Diagnosis Date Noted   Edema of both lower extremities due to peripheral venous insufficiency 07/13/2020   Acute cholecystitis 03/07/2016   Small vessel disease, cerebrovascular 05/31/2015   Aneurysm, cerebral, nonruptured 05/31/2015   Dizziness and giddiness 05/31/2015   Altered mental status    Acute CVA (cerebrovascular accident) ([place]) 03/15/2015   Dysphasia 03/12/2015   Thrombocytopenia ([place]) 03/12/2015   Hypokalemia    Endotracheally intubated    Essential hypertension    Intracranial aneurysm 11/18/2014   Respiratory failure ([place])    Cerebral aneurysm    Brain aneurysm    Aphasia    Palmar erythema    CVA (cerebral infarction) 05/02/2014   Aneurysm ([place]) 05/02/2014   Expressive aphasia 05/02/2014   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 [place], [place], [address] Phone: [phone]   Fax:  [phone]  Name: IREENE BALLOWE MRN: [id number] Date of Birth: August 02, 1945

## 2020-11-23 NOTE — Telephone Encounter (Signed)
Called to schedule cta head/neck, no answer, no vm. Will try back later. AW

## 2020-11-30 ENCOUNTER — Other Ambulatory Visit: Payer: Medicare Other

## 2020-11-30 ENCOUNTER — Ambulatory Visit: Payer: Medicare Other | Admitting: Physical Therapy

## 2020-11-30 ENCOUNTER — Other Ambulatory Visit: Payer: Self-pay

## 2020-11-30 DIAGNOSIS — R2681 Unsteadiness on feet: Secondary | ICD-10-CM

## 2020-11-30 DIAGNOSIS — R2689 Other abnormalities of gait and mobility: Secondary | ICD-10-CM | POA: Diagnosis not present

## 2020-11-30 DIAGNOSIS — M6281 Muscle weakness (generalized): Secondary | ICD-10-CM

## 2020-12-01 ENCOUNTER — Encounter: Payer: Self-pay | Admitting: Physical Therapy

## 2020-12-01 NOTE — Therapy (Signed)
[facility] [address] [place], [place], [address] Phone: [phone]   Fax:  [phone]  Physical Therapy Treatment  Patient Details  Name: Jeanne Lawson MRN: [id number] Date of Birth: [birth year] Referring Provider (PT): Dr. 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    COPD (chronic obstructive pulmonary disease) ([place])    Coronary artery disease 1996   Known with prior mild lesion of LAD demonstrated by Cardiac Catheterization in 1996   Edema of foot    She has a history of chronic edema of the left dated back to age 66 when she suufered severe frostbite playing  on the snow as a child   Hypertension    Lower extremity edema    PAC (premature atrial contraction)    Pancreatitis    x2   PONV (postoperative nausea and vomiting)    problems waking up last time 4/16 only time   Saccular aneurysm    She also has 2 known which were stable between the MRA of October2010 and the MRA  of April 2011.   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 Has normal EF.    Past  Surgical History:  Procedure Laterality Date   ABDOMINAL HYSTERECTOMY     APPENDECTOMY     CARDIAC CATHETERIZATION  1996   Mild CAD with vasospasm   CARDIOVASCULAR STRESS TEST  12-02-2001   EF 70%   CHOLECYSTECTOMY N/A 03/09/2016   Procedure: LAPAROSCOPIC CHOLECYSTECTOMY WITH INTRAOPERATIVE CHOLANGIOGRAM;  Surgeon: Georganna Skeans, MD;  Location: [place];  Service: General;  Laterality: N/A;   ENDARTERECTOMY Right 08/12/2014   Procedure: RIGHT  COMMON CAROTID ARTERY EXPOSURE FOR INTERVENTIONAL RADIOLOGY PROCEDURE BY DR.DEVASHWAR,Insertion 6 FR sheath;  Surgeon: Elam Dutch, MD;  Location: [place];  Service: Vascular;  Laterality: Right;   ENDARTERECTOMY Right 08/12/2014   Procedure:  CAROTID  EXPOSURE CLOSURE RIGHT NECK, REPAIR RIGHT COMMON CAROTID ARTERY;  Surgeon: Elam Dutch, MD;  Location: [place];  Service: Vascular;  Laterality: Right;   ENDARTERECTOMY Left 11/18/2014   Procedure: CAROTID EXPOSURE;  Surgeon: Elam Dutch, MD;  Location: [place];  Service: Vascular;  Laterality: Left;   ENDARTERECTOMY Left 11/18/2014   Procedure: CLOSURE CAROTID;  Surgeon: Elam Dutch, MD;  Location: [place];  Service: Vascular;  Laterality: Left;   ENDARTERECTOMY Right 08/22/2017   Procedure: EXPOSURE CAROTID ARTERY FOR DEVESHWAR  SURGERY;  Surgeon: Elam Dutch, MD;  Location: [facility] OR;  Service: Vascular;  Laterality: Right;   IR ANGIO INTRA EXTRACRAN SEL INTERNAL CAROTID UNI R MOD SED  08/22/2017   IR ANGIOGRAM FOLLOW UP STUDY  08/22/2017   IR RADIOLOGIST EVAL & MGMT  06/26/2016   IR RADIOLOGIST EVAL & MGMT  05/08/2017   IR RADIOLOGY PERIPHERAL GUIDED IV START  12/03/2017   IR TRANSCATH/EMBOLIZ  08/22/2017   IR US GUIDE VASC ACCESS RIGHT  12/03/2017   LAPAROSCOPY     NECK SURGERY  50 yrs ago   Left side tumor   RADIOLOGY WITH ANESTHESIA N/A 05/25/2014   Procedure: RADIOLOGY WITH ANESTHESIA;  Surgeon: [name], MD;  Location: [place];  Service: Radiology;  Laterality: N/A;   RADIOLOGY WITH  ANESTHESIA N/A 08/12/2014   Procedure: RADIOLOGY WITH ANESTHESIA;  Surgeon: [name], MD;  Location: [place];  Service: Radiology;  Laterality: N/A;   RADIOLOGY WITH ANESTHESIA N/A 03/15/2015   Procedure: MRI OF BRAIN WITHOUT CONTRAST;  Surgeon: Medication Radiologist, MD;  Location: [place];  Service: Radiology;  Laterality: N/A;  DR. TAT/MRI   US ECHOCARDIOGRAPHY  04-26-2009   EF 65-70%   VESICOVAGINAL FISTULA CLOSURE W/ TAH  25 yrs ago   WOUND EXPLORATION Right 08/22/2017   Procedure: REMOVAL OF RIGHT COMMON CAROTID SHEATH AND WOUND CLOSURE;  Surgeon: Rosetta Posner, MD;  Location: [place];  Service: Vascular;  Laterality: Right;    There were no vitals filed for this visit.   Subjective Assessment - 11/30/20 0945     Subjective Pt reports she slid out of recliner very early in the am a few days ago and had to call EMS to get her up - did not get hurt    Patient Stated Goals improve walking and balance; "get the swelling down in my legs"    Currently in Pain? No/denies                               [facility] Adult PT Treatment/Exercise - 12/01/20 0001       Transfers   Transfers Sit to Stand;Stand to Sit    Sit to Stand 5: Supervision    Number of Reps Other reps (comment)   5   Comments RUE support used from mat table      Ambulation/Gait   Ambulation/Gait Yes    Ambulation/Gait Assistance 5: Supervision    Ambulation Distance (Feet) 115 Feet    Assistive device Straight cane    Gait Pattern Step-through pattern    Ambulation Surface Level;Indoor    Stairs Yes    Stairs Assistance 5: Supervision    Stair Management Technique One rail Right    Number of Stairs 4   2 reps - 8 steps total   Height of Stairs 6      Knee/Hip Exercises: Aerobic   Recumbent Bike SciFit level 3.0 x 5" with UE's & LE's      Knee/Hip Exercises: Standing   Hip Flexion Stengthening;Right;Left;10 reps;Knee bent;Knee straight;2 sets   3# weight used on each leg   Hip Abduction  Stengthening;Right;Left;1 set;10 reps   3#   Hip Extension Stengthening;Right;Left;1 set;10 reps;Knee straight   3# weight used on each leg   Forward Step Up Both;1 set;10 reps;Hand Hold: 2;Step Height: 6"    Functional Squat 1 set;10 reps   RW in front, chair behind pt   Other Standing Knee Exercises  tap ups to 1st step with UE support prn on handrails reps each foot                       PT Short Term Goals - 11/30/20 0946       PT SHORT TERM GOAL #1   Title Increase gait velocity to >/= 1.6 ft/sec with RW for incr. gait efficiency.    Baseline 26.03 secs = 1.26 ft/sec with RW;     11/24/20  -  26.18 sec= 1.25 ft/sec with RW    Time 4    Period Weeks    Status Not Met    Target Date 11/26/20      PT SHORT TERM GOAL #2   Title Pt will improve TUG score to </= 28 secs with RW to demo improved functional mobility.    Baseline 32.29 secs with RW; 11/23/2020  - 21.03 sec with RW    Time 4    Period Weeks    Status Achieved    Target Date 11/26/20      PT SHORT TERM GOAL #3   Title Pt will amb. 25' with SPC with CGA for increased safety with household and community mobility.    Time 4    Period Weeks    Status Achieved    Target Date 11/26/20      PT SHORT TERM GOAL #4   Title Independent in HEP for balance, ROM and strengthening.    Time 4    Period Weeks    Status Achieved    Target Date 11/26/20               PT Long Term Goals - 12/01/20 1148       PT LONG TERM GOAL #1   Title Independent in updated HEP for bil. strengthening and balance exercises.    Time 8    Period Weeks    Status New      PT LONG TERM GOAL #2   Title Pt will report ability to transfer in/out of her car in order to be able to return to driving.    Time 8    Period Weeks    Status New      PT LONG TERM GOAL #3   Title Improve TUG score to </= 23 secs with RW to demo improved mobility.    Time 8    Period Weeks    Status New      PT LONG TERM GOAL #4   Title Incr.  gait velocity to >/= 2.0 ft/sec with RW for incr. gait efficiency.    Baseline 26.03 secs = 1.26 ft/sec with RW on 10-26-20    Time 8    Period Weeks    Status New      PT LONG TERM GOAL #5   Title Amb. 70' with SPC with SBA on flat, even surface for incr. community accessibility.    Time 8    Period Weeks    Status New                   Plan - 12/01/20 1146     Clinical Impression Statement Pt's mobility continues to be limited and impacted by severe edema in bil. LE's as pt remains unable to don her normal shoes.  Pt is increasing bil. hip musc. strength as pt wa able to perform PRE's with 3# ankle weight in today's session.  Cont with POC.  Personal Factors and Comorbidities Comorbidity 2;Past/Current Experience;Time since onset of injury/illness/exacerbation;Transportation    Comorbidities h/o CVA x 2 - April 2016 and Oct. 2010:  TIA  April 2011; Ventricular hypertrophy 04/2009;  COPD:  CAD:  bil. LE edema due to chronic venous insufficiency, HTN, cerebral aneurysm    Examination-Activity Limitations Locomotion Level;Transfers;Bend;Lift;Stand;Stairs;Squat;Caring for Others    Examination-Participation Restrictions Driving;Cleaning;Community Activity;Shop;Meal Prep    Stability/Clinical Decision Making Evolving/Moderate complexity    Rehab Potential Good    PT Frequency 1x / week    PT Duration 8 weeks    PT Treatment/Interventions ADLs/Self Care Home Management;DME Instruction;Gait training;Stair training;Therapeutic activities;Therapeutic exercise;Neuromuscular re-education;Patient/family education    PT Next Visit Plan Cont with LE strengthening:  gait train with [place]; SciFit    Consulted and Agree with Plan of Care Patient             Patient will benefit from skilled therapeutic intervention in order to improve the following deficits and impairments:  Difficulty walking, Decreased activity tolerance, Decreased balance, Decreased range of motion, Decreased  strength, Increased edema, Impaired flexibility  Visit Diagnosis: Other abnormalities of gait and mobility  Unsteadiness on feet  Muscle weakness (generalized)     Problem List Patient Active Problem List   Diagnosis Date Noted   Edema of both lower extremities due to peripheral venous insufficiency 07/13/2020   Acute cholecystitis 03/07/2016   Small vessel disease, cerebrovascular 05/31/2015   Aneurysm, cerebral, nonruptured 05/31/2015   Dizziness and giddiness 05/31/2015   Altered mental status    Acute CVA (cerebrovascular accident) ([place]) 03/15/2015   Dysphasia 03/12/2015   Thrombocytopenia ([place]) 03/12/2015   Hypokalemia    Endotracheally intubated    Essential hypertension    Intracranial aneurysm 11/18/2014   Respiratory failure ([place])    Cerebral aneurysm    Brain aneurysm    Aphasia    Palmar erythema    CVA (cerebral infarction) 05/02/2014   Aneurysm ([place]) 05/02/2014   Expressive aphasia 05/02/2014   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   Tobacco abuse 10/24/2010   TIA (transient ischemic attack)    Hypertension    Ventricular hypertrophy    CVA 02/23/2010   STRESS FRACTURE, FOOT 02/22/2010    Alda Lea, PT 12/01/2020, 11:49 AM  [facility] [address] [place], [place], [address] Phone: [phone]   Fax:  [phone]  Name: Jeanne Lawson MRN: [id number] Date of Birth: [birth year]

## 2020-12-07 ENCOUNTER — Other Ambulatory Visit: Payer: Self-pay

## 2020-12-07 ENCOUNTER — Ambulatory Visit: Payer: Medicare Other | Admitting: Physical Therapy

## 2020-12-07 DIAGNOSIS — R2689 Other abnormalities of gait and mobility: Secondary | ICD-10-CM | POA: Diagnosis not present

## 2020-12-07 DIAGNOSIS — M6281 Muscle weakness (generalized): Secondary | ICD-10-CM

## 2020-12-07 DIAGNOSIS — R2681 Unsteadiness on feet: Secondary | ICD-10-CM

## 2020-12-08 ENCOUNTER — Encounter: Payer: Self-pay | Admitting: Physical Therapy

## 2020-12-08 NOTE — Therapy (Signed)
[facility] [address] [place], [place], [address] Phone: [phone]   Fax:  [phone]  Physical Therapy Treatment  Patient Details  Name: Jeanne Lawson MRN: [id number] Date of Birth: 10/17/1945 Referring Provider (PT): Dr. 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    COPD (chronic obstructive pulmonary disease) ([place])    Coronary artery disease 1996   Known with prior mild lesion of LAD demonstrated by Cardiac Catheterization in 1996   Edema of foot    She has a history of chronic edema of the left dated back to age 75 when she suufered severe frostbite playing  on the snow as a child   Hypertension    Lower extremity edema    PAC (premature atrial contraction)    Pancreatitis    x2   PONV (postoperative nausea and vomiting)    problems waking up last time 4/16 only time   Saccular aneurysm    She also has 2 known which were stable between the MRA of October2010 and the MRA  of April 2011.   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 Has normal EF.    Past  Surgical History:  Procedure Laterality Date   ABDOMINAL HYSTERECTOMY     APPENDECTOMY     CARDIAC CATHETERIZATION  1996   Mild CAD with vasospasm   CARDIOVASCULAR STRESS TEST  12-02-2001   EF 70%   CHOLECYSTECTOMY N/A 03/09/2016   Procedure: LAPAROSCOPIC CHOLECYSTECTOMY WITH INTRAOPERATIVE CHOLANGIOGRAM;  Surgeon: Georganna Skeans, MD;  Location: [place];  Service: General;  Laterality: N/A;   ENDARTERECTOMY Right 08/12/2014   Procedure: RIGHT  COMMON CAROTID ARTERY EXPOSURE FOR INTERVENTIONAL RADIOLOGY PROCEDURE BY DR.DEVASHWAR,Insertion 6 FR sheath;  Surgeon: Elam Dutch, MD;  Location: [place];  Service: Vascular;  Laterality: Right;   ENDARTERECTOMY Right 08/12/2014   Procedure:  CAROTID  EXPOSURE CLOSURE RIGHT NECK, REPAIR RIGHT COMMON CAROTID ARTERY;  Surgeon: Elam Dutch, MD;  Location: [place];  Service: Vascular;  Laterality: Right;   ENDARTERECTOMY Left 11/18/2014   Procedure: CAROTID EXPOSURE;  Surgeon: Elam Dutch, MD;  Location: [place];  Service: Vascular;  Laterality: Left;   ENDARTERECTOMY Left 11/18/2014   Procedure: CLOSURE CAROTID;  Surgeon: Elam Dutch, MD;  Location: [place];  Service: Vascular;  Laterality: Left;   ENDARTERECTOMY Right 08/22/2017   Procedure: EXPOSURE CAROTID ARTERY FOR DEVESHWAR  SURGERY;  Surgeon: Elam Dutch, MD;  Location: [facility] OR;  Service: Vascular;  Laterality: Right;   IR ANGIO INTRA EXTRACRAN SEL INTERNAL CAROTID UNI R MOD SED  08/22/2017   IR ANGIOGRAM FOLLOW UP STUDY  08/22/2017   IR RADIOLOGIST EVAL & MGMT  06/26/2016   IR RADIOLOGIST EVAL & MGMT  05/08/2017   IR RADIOLOGY PERIPHERAL GUIDED IV START  12/03/2017   IR TRANSCATH/EMBOLIZ  08/22/2017   IR US GUIDE VASC ACCESS RIGHT  12/03/2017   LAPAROSCOPY     NECK SURGERY  50 yrs ago   Left side tumor   RADIOLOGY WITH ANESTHESIA N/A 05/25/2014   Procedure: RADIOLOGY WITH ANESTHESIA;  Surgeon: [name], MD;  Location: [facility];  Service: Radiology;  Laterality: N/A;   RADIOLOGY WITH  ANESTHESIA N/A 08/12/2014   Procedure: RADIOLOGY WITH ANESTHESIA;  Surgeon: [name], MD;  Location: [place];  Service: Radiology;  Laterality: N/A;   RADIOLOGY WITH ANESTHESIA N/A 03/15/2015   Procedure: MRI OF BRAIN WITHOUT CONTRAST;  Surgeon: Medication Radiologist, MD;  Location: [facility];  Service: Radiology;  Laterality: N/A;  DR. TAT/MRI   US ECHOCARDIOGRAPHY  04-26-2009   EF 65-70%   VESICOVAGINAL FISTULA CLOSURE W/ TAH  25 yrs ago   WOUND EXPLORATION Right 08/22/2017   Procedure: REMOVAL OF RIGHT COMMON CAROTID SHEATH AND WOUND CLOSURE;  Surgeon: Rosetta Posner, MD;  Location: [facility];  Service: Vascular;  Laterality: Right;    There were no vitals filed for this visit.   Subjective Assessment - 12/08/20 1058     Subjective Pt reports is going to the beach on Saturday with her daughter and staying for 2 weeks; will have full flight of stairs which she will have to go up to get inside the house    Patient Stated Goals improve walking and balance; "get the swelling down in my legs"    Currently in Pain? No/denies                               [facility] Adult PT Treatment/Exercise - 12/08/20 0001       Transfers   Transfers Sit to Stand;Stand to Sit    Sit to Stand 5: Supervision    Number of Reps Other reps (comment)   5 reps   Comments no UE support used - from mat table      Ambulation/Gait   Ambulation/Gait Yes    Ambulation/Gait Assistance 4: Min guard    Ambulation Distance (Feet) 50 Feet    Assistive device Other (Comment)   min HHA (no cane used)   Gait Pattern Step-through pattern    Ambulation Surface Level;Indoor    Stairs Yes    Stairs Assistance 5: Supervision    Stair Management Technique One rail Right    Number of Stairs 4    Height of Stairs 6      Knee/Hip Exercises: Aerobic   Recumbent Bike SciFit level 3.0 x 5" with UE's & LE's      Knee/Hip Exercises: Standing   Heel Raises Both;1 set;10 reps    Hip Flexion  Stengthening;Right;Left;10 reps;Knee bent;Knee straight;2 sets   3# weight used on each leg   Hip Abduction Stengthening;Right;Left;1 set;10 reps   3#   Hip Extension Stengthening;Right;Left;1 set;10 reps;Knee straight   3# weight used on each leg   Forward Step Up Both;1 set;10 reps;Hand Hold: 2;Step Height: 6"    Other Standing Knee  Exercises tap ups to 1st step with UE support prn on handrails reps each foot            NeuroRe-ed: Pt performed tap ups to 1st 6" step with min HHA - 10 reps each LE; pt has more difficulty lifting LLE due to weak hip flexors and Limited active knee flexion due to lymphedema           PT Short Term Goals - 12/08/20 1104       PT SHORT TERM GOAL #1   Title Increase gait velocity to >/= 1.6 ft/sec with RW for incr. gait efficiency.    Baseline 26.03 secs = 1.26 ft/sec with RW;     11/24/20  -  26.18 sec= 1.25 ft/sec with RW    Time 4    Period Weeks    Status Not Met    Target Date 11/26/20      PT SHORT TERM GOAL #2   Title Pt will improve TUG score to </= 28 secs with RW to demo improved functional mobility.    Baseline 32.29 secs with RW; 11/23/2020  - 21.03 sec with RW    Time 4    Period Weeks    Status Achieved    Target Date 11/26/20      PT SHORT TERM GOAL #3   Title Pt will amb. 82' with SPC with CGA for increased safety with household and community mobility.    Time 4    Period Weeks    Status Achieved    Target Date 11/26/20      PT SHORT TERM GOAL #4   Title Independent in HEP for balance, ROM and strengthening.    Time 4    Period Weeks    Status Achieved    Target Date 11/26/20               PT Long Term Goals - 12/08/20 1104       PT LONG TERM GOAL #1   Title Independent in updated HEP for bil. strengthening and balance exercises. (target date 12-24-20)    Time 8    Period Weeks    Status New      PT LONG TERM GOAL #2   Title Pt will report ability to transfer in/out of her car in order to be able  to return to driving.    Time 8    Period Weeks    Status New      PT LONG TERM GOAL #3   Title Improve TUG score to </= 23 secs with RW to demo improved mobility.    Time 8    Period Weeks    Status New      PT LONG TERM GOAL #4   Title Incr. gait velocity to >/= 2.0 ft/sec with RW for incr. gait efficiency.    Baseline 26.03 secs = 1.26 ft/sec with RW on 10-26-20    Time 8    Period Weeks    Status New      PT LONG TERM GOAL #5   Title Amb. 61' with SPC with SBA on flat, even surface for incr. community accessibility.    Time 8    Period Weeks    Status New                   Plan - 12/08/20 1104     Clinical Impression Statement Pt is progressing slowly towards LTG's and continues to be limited in AROM  and flexibility by the lymphedema in her bil. LE's.  Pt did perform sit to stand without UE support (from mat table) today and amb. approx. 52' with min HHA without use of device.  Pt did state she would not attempt this at home due to fear of falling and safety concerns.  Cont with POC - pt to be out of town for 2 weeks; will renew POC at next scheduled appt.    Personal Factors and Comorbidities Comorbidity 2;Past/Current Experience;Time since onset of injury/illness/exacerbation;Transportation    Comorbidities h/o CVA x 2 - April 2016 and Oct. 2010:  TIA  April 2011; Ventricular hypertrophy 04/2009;  COPD:  CAD:  bil. LE edema due to chronic venous insufficiency, HTN, cerebral aneurysm    Examination-Activity Limitations Locomotion Level;Transfers;Bend;Lift;Stand;Stairs;Squat;Caring for Others    Examination-Participation Restrictions Driving;Cleaning;Community Activity;Shop;Meal Prep    Stability/Clinical Decision Making Evolving/Moderate complexity    Rehab Potential Good    PT Frequency 1x / week    PT Duration 8 weeks    PT Treatment/Interventions ADLs/Self Care Home Management;DME Instruction;Gait training;Stair training;Therapeutic activities;Therapeutic  exercise;Neuromuscular re-education;Patient/family education    PT Next Visit Plan Check LTG's and renew; Cont with LE strengthening:  gait train with [place]; SciFit    Consulted and Agree with Plan of Care Patient             Patient will benefit from skilled therapeutic intervention in order to improve the following deficits and impairments:  Difficulty walking, Decreased activity tolerance, Decreased balance, Decreased range of motion, Decreased strength, Increased edema, Impaired flexibility  Visit Diagnosis: Other abnormalities of gait and mobility  Unsteadiness on feet  Muscle weakness (generalized)     Problem List Patient Active Problem List   Diagnosis Date Noted   Edema of both lower extremities due to peripheral venous insufficiency 07/13/2020   Acute cholecystitis 03/07/2016   Small vessel disease, cerebrovascular 05/31/2015   Aneurysm, cerebral, nonruptured 05/31/2015   Dizziness and giddiness 05/31/2015   Altered mental status    Acute CVA (cerebrovascular accident) ([place]) 03/15/2015   Dysphasia 03/12/2015   Thrombocytopenia ([place]) 03/12/2015   Hypokalemia    Endotracheally intubated    Essential hypertension    Intracranial aneurysm 11/18/2014   Respiratory failure ([place])    Cerebral aneurysm    Brain aneurysm    Aphasia    Palmar erythema    CVA (cerebral infarction) 05/02/2014   Aneurysm ([place]) 05/02/2014   Expressive aphasia 05/02/2014   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 [place], [place], [address] Phone: [phone]   Fax:  [phone]  Name: KENDALYN CRANFIELD MRN: [id number] Date of Birth: [birth year]

## 2020-12-14 ENCOUNTER — Ambulatory Visit: Payer: Medicare Other | Admitting: Physical Therapy

## 2020-12-21 ENCOUNTER — Ambulatory Visit: Payer: Medicare Other | Admitting: Physical Therapy

## 2020-12-28 ENCOUNTER — Ambulatory Visit: Payer: Medicare Other | Attending: Internal Medicine | Admitting: Physical Therapy

## 2020-12-28 ENCOUNTER — Other Ambulatory Visit: Payer: Self-pay

## 2020-12-28 ENCOUNTER — Encounter: Payer: Self-pay | Admitting: Physical Therapy

## 2020-12-28 DIAGNOSIS — M6281 Muscle weakness (generalized): Secondary | ICD-10-CM | POA: Insufficient documentation

## 2020-12-28 DIAGNOSIS — R2689 Other abnormalities of gait and mobility: Secondary | ICD-10-CM | POA: Insufficient documentation

## 2020-12-28 DIAGNOSIS — R2681 Unsteadiness on feet: Secondary | ICD-10-CM | POA: Insufficient documentation

## 2020-12-29 ENCOUNTER — Encounter: Payer: Self-pay | Admitting: Physical Therapy

## 2020-12-29 NOTE — Therapy (Signed)
[facility] [address] [place], [place], [address] Phone: [phone]   Fax:  [phone]  Physical Therapy Treatment  Patient Details  Name: Jeanne Lawson MRN: [id number] Date of Birth: January 21, 1946 Referring Provider (PT): Dr. [name]   Encounter Date: 12/28/2020   PT End of Session - 12/29/20 1414     Visit Number 7    Number of Visits 9    Date for PT Re-Evaluation 12/24/20   extend 2 weeks due to pt not having been seen since 12-07-20   Authorization Type [facility] Medicare    Authorization Time Period 10-26-20 - 01-22-21    PT Start Time 0933    PT Stop Time 1020    PT Time Calculation (min) 47 min    Equipment Utilized During Treatment Gait belt    Activity Tolerance Patient tolerated treatment well    Behavior During Therapy [facility] for tasks assessed/performed             Past Medical History:  Diagnosis Date   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    COPD (chronic obstructive pulmonary disease) ([place])    Coronary artery disease 1996   Known with prior mild lesion of LAD demonstrated by Cardiac Catheterization in 1996   Edema of foot    She has a history of chronic edema of the left dated back to age 72 when she suufered severe frostbite playing  on the snow as a child   Hypertension    Lower extremity edema    PAC (premature atrial contraction)    Pancreatitis    x2   PONV (postoperative nausea and vomiting)    problems waking up last time 4/16 only time   Saccular aneurysm    She also has 2 known which were stable between the MRA of October2010 and the MRA  of April 2011.   Stroke 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 Has normal EF.    Past Surgical History:  Procedure Laterality Date   ABDOMINAL HYSTERECTOMY     APPENDECTOMY     CARDIAC CATHETERIZATION  1996   Mild CAD with vasospasm   CARDIOVASCULAR STRESS TEST  12-02-2001   EF 70%   CHOLECYSTECTOMY N/A 03/09/2016   Procedure: LAPAROSCOPIC CHOLECYSTECTOMY WITH INTRAOPERATIVE CHOLANGIOGRAM;  Surgeon: Georganna Skeans, MD;  Location: [place];  Service: General;  Laterality: N/A;   ENDARTERECTOMY Right 08/12/2014   Procedure: RIGHT  COMMON CAROTID ARTERY EXPOSURE FOR INTERVENTIONAL RADIOLOGY PROCEDURE BY DR.DEVASHWAR,Insertion 6 FR sheath;  Surgeon: Elam Dutch, MD;  Location: [place];  Service: Vascular;  Laterality: Right;   ENDARTERECTOMY Right 08/12/2014   Procedure:  CAROTID  EXPOSURE CLOSURE RIGHT NECK, REPAIR RIGHT COMMON CAROTID ARTERY;  Surgeon: Elam Dutch, MD;  Location: [place];  Service: Vascular;  Laterality: Right;   ENDARTERECTOMY Left 11/18/2014   Procedure: CAROTID EXPOSURE;  Surgeon: Elam Dutch, MD;  Location: [place];  Service: Vascular;  Laterality: Left;   ENDARTERECTOMY Left 11/18/2014   Procedure: CLOSURE CAROTID;  Surgeon: Elam Dutch, MD;  Location: [place];  Service: Vascular;  Laterality: Left;  ENDARTERECTOMY Right 08/22/2017   Procedure: EXPOSURE CAROTID ARTERY FOR [place] SURGERY;  Surgeon: Elam Dutch, MD;  Location: [facility] OR;  Service: Vascular;  Laterality: Right;   IR ANGIO INTRA EXTRACRAN SEL INTERNAL CAROTID UNI R MOD SED  08/22/2017   IR ANGIOGRAM FOLLOW UP STUDY  08/22/2017   IR RADIOLOGIST EVAL & MGMT  06/26/2016   IR RADIOLOGIST EVAL & MGMT  05/08/2017   IR RADIOLOGY PERIPHERAL GUIDED IV START  12/03/2017   IR TRANSCATH/EMBOLIZ  08/22/2017   IR US GUIDE VASC ACCESS RIGHT  12/03/2017   LAPAROSCOPY     NECK SURGERY  50 yrs ago   Left side tumor   RADIOLOGY WITH ANESTHESIA N/A 05/25/2014   Procedure: RADIOLOGY WITH ANESTHESIA;  Surgeon: [name], MD;  Location:  [place];  Service: Radiology;  Laterality: N/A;   RADIOLOGY WITH ANESTHESIA N/A 08/12/2014   Procedure: RADIOLOGY WITH ANESTHESIA;  Surgeon: [name], MD;  Location: [place];  Service: Radiology;  Laterality: N/A;   RADIOLOGY WITH ANESTHESIA N/A 03/15/2015   Procedure: MRI OF BRAIN WITHOUT CONTRAST;  Surgeon: Medication Radiologist, MD;  Location: [place];  Service: Radiology;  Laterality: N/A;  DR. TAT/MRI   US ECHOCARDIOGRAPHY  04-26-2009   EF 65-70%   VESICOVAGINAL FISTULA CLOSURE W/ TAH  25 yrs ago   WOUND EXPLORATION Right 08/22/2017   Procedure: REMOVAL OF RIGHT COMMON CAROTID SHEATH AND WOUND CLOSURE;  Surgeon: Rosetta Posner, MD;  Location: [facility];  Service: Vascular;  Laterality: Right;    There were no vitals filed for this visit.   Subjective Assessment - 12/28/20 0939     Subjective Pt reports she ended up not going to the beach to visit her daughter at the beach; states she sat for approx. 2-3 hours straight without getting up at her daughter's house on Thanksgiving  - legs were spasming; hopes to be able to go in a few weeks    Patient Stated Goals improve walking and balance; "get the swelling down in my legs"    Currently in Pain? No/denies                               [facility] Adult PT Treatment/Exercise - 12/29/20 0001       Transfers   Transfers Sit to Stand;Stand to Sit    Sit to Stand 5: Supervision    Number of Reps 10 reps    Comments no UE support used - from mat table      Ambulation/Gait   Ambulation/Gait Yes    Ambulation/Gait Assistance 4: Min guard    Ambulation Distance (Feet) 100 Feet    Assistive device Other (Comment)   min. hand held assist   Gait Pattern Step-through pattern    Ambulation Surface Level;Indoor    Stairs Yes    Stairs Assistance 5: Supervision    Stair Management Technique One rail Right    Number of Stairs 4   2 reps - 8 steps total   Height of Stairs 6      Exercises   Exercises Knee/Hip      Knee/Hip  Exercises: Aerobic   Recumbent Bike SciFit level 2.5 x 5" with UE's & LE's      Knee/Hip Exercises: Standing   Hip Flexion Stengthening;Right;Left;10 reps;Knee bent;Knee straight;2 sets   3# weight used on each leg   Hip Abduction Stengthening;Right;Left;1 set;10 reps   3#   Hip Extension Stengthening;Right;Left;1  set;10 reps;Knee straight   3# weight used on each leg   Forward Step Up Both;1 set;10 reps;Hand Hold: 2;Step Height: 6"    Other Standing Knee Exercises tap ups to 1st step with UE support prn on handrails reps each foot                 Balance Exercises - 12/29/20 0001       Balance Exercises: Standing   Stepping Strategy Anterior;Lateral   5 reps each direction each leg   Step Ups --   with UE support on RW   Marching Solid surface;Upper extremity assist 2;10 reps;Static                  PT Short Term Goals - 12/29/20 1415       PT SHORT TERM GOAL #1   Title Increase gait velocity to >/= 1.6 ft/sec with RW for incr. gait efficiency.    Baseline 26.03 secs = 1.26 ft/sec with RW;     11/24/20  -  26.18 sec= 1.25 ft/sec with RW    Time 4    Period Weeks    Status Not Met    Target Date 11/26/20      PT SHORT TERM GOAL #2   Title Pt will improve TUG score to </= 28 secs with RW to demo improved functional mobility.    Baseline 32.29 secs with RW; 11/23/2020  - 21.03 sec with RW    Time 4    Period Weeks    Status Achieved    Target Date 11/26/20      PT SHORT TERM GOAL #3   Title Pt will amb. 106' with SPC with CGA for increased safety with household and community mobility.    Time 4    Period Weeks    Status Achieved    Target Date 11/26/20      PT SHORT TERM GOAL #4   Title Independent in HEP for balance, ROM and strengthening.    Time 4    Period Weeks    Status Achieved    Target Date 11/26/20               PT Long Term Goals - 12/29/20 1415       PT LONG TERM GOAL #1   Title Independent in updated HEP for bil.  strengthening and balance exercises. (target date 12-24-20) extend til 01-04-21 due to pt not having been in 3 weeks    Time 8    Period Weeks    Status New    Target Date --   extend til 01-14-21     PT LONG TERM GOAL #2   Title Pt will report ability to transfer in/out of her car in order to be able to return to driving.    Time 8    Period Weeks    Status New      PT LONG TERM GOAL #3   Title Improve TUG score to </= 23 secs with RW to demo improved mobility.    Time 8    Period Weeks    Status New      PT LONG TERM GOAL #4   Title Incr. gait velocity to >/= 2.0 ft/sec with RW for incr. gait efficiency.    Baseline 26.03 secs = 1.26 ft/sec with RW on 10-26-20    Time 8    Period Weeks    Status New      PT LONG TERM GOAL #  5   Title Amb. 230' with SPC with SBA on flat, even surface for incr. community accessibility.    Time 8    Period Weeks    Status New                   Plan - 12/29/20 1423     Clinical Impression Statement Skilled PT session focused on bil. LE strengthening and gait training without RW with HHA only.  Pt continues to be limited in AROM of bil. LE's by lymphedema in her bil. lower legs, ankles and feet.  Pt able to negotiate 4 steps using 1 hand rail but heavily relies on UE support to assist with ascension of steps.  Cont with POC.    Personal Factors and Comorbidities Comorbidity 2;Past/Current Experience;Time since onset of injury/illness/exacerbation;Transportation    Comorbidities h/o CVA x 2 - April 2016 and Oct. 2010:  TIA  April 2011; Ventricular hypertrophy 04/2009;  COPD:  CAD:  bil. LE edema due to chronic venous insufficiency, HTN, cerebral aneurysm    Examination-Activity Limitations Locomotion Level;Transfers;Bend;Lift;Stand;Stairs;Squat;Caring for Others    Examination-Participation Restrictions Driving;Cleaning;Community Activity;Shop;Meal Prep    Stability/Clinical Decision Making Evolving/Moderate complexity    Rehab Potential  Good    PT Frequency 1x / week    PT Duration 8 weeks    PT Treatment/Interventions ADLs/Self Care Home Management;DME Instruction;Gait training;Stair training;Therapeutic activities;Therapeutic exercise;Neuromuscular re-education;Patient/family education    PT Next Visit Plan Check LTG's and renew; Cont with LE strengthening:  gait train with [place]; SciFit    Consulted and Agree with Plan of Care Patient             Patient will benefit from skilled therapeutic intervention in order to improve the following deficits and impairments:  Difficulty walking, Decreased activity tolerance, Decreased balance, Decreased range of motion, Decreased strength, Increased edema, Impaired flexibility  Visit Diagnosis: Other abnormalities of gait and mobility  Unsteadiness on feet  Muscle weakness (generalized)     Problem List Patient Active Problem List   Diagnosis Date Noted   Edema of both lower extremities due to peripheral venous insufficiency 07/13/2020   Acute cholecystitis 03/07/2016   Small vessel disease, cerebrovascular 05/31/2015   Aneurysm, cerebral, nonruptured 05/31/2015   Dizziness and giddiness 05/31/2015   Altered mental status    Acute CVA (cerebrovascular accident) ([place]) 03/15/2015   Dysphasia 03/12/2015   Thrombocytopenia ([place]) 03/12/2015   Hypokalemia    Endotracheally intubated    Essential hypertension    Intracranial aneurysm 11/18/2014   Respiratory failure ([place])    Cerebral aneurysm    Brain aneurysm    Aphasia    Palmar erythema    CVA (cerebral infarction) 05/02/2014   Aneurysm ([place]) 05/02/2014   Expressive aphasia 05/02/2014   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

## 2021-01-04 ENCOUNTER — Ambulatory Visit: Payer: Medicare Other | Admitting: Physical Therapy

## 2021-01-04 ENCOUNTER — Other Ambulatory Visit: Payer: Self-pay

## 2021-01-04 DIAGNOSIS — M6281 Muscle weakness (generalized): Secondary | ICD-10-CM

## 2021-01-04 DIAGNOSIS — R2689 Other abnormalities of gait and mobility: Secondary | ICD-10-CM

## 2021-01-04 DIAGNOSIS — R2681 Unsteadiness on feet: Secondary | ICD-10-CM

## 2021-01-05 NOTE — Therapy (Signed)
[facility] [address] [place], [place], [address] Phone: [phone]   Fax:  [phone]  Physical Therapy Treatment  Patient Details  Name: Jeanne Lawson MRN: [id number] Date of Birth: 08-20-1945 Referring Provider (PT): Dr. [name]   Encounter Date: 01/04/2021   PT End of Session - 01/05/21 1842     Visit Number 8    Number of Visits 9    Date for PT Re-Evaluation 12/24/20   extend 2 weeks due to pt not having been seen since 12-07-20   Authorization Type [facility] Medicare    Authorization Time Period 10-26-20 - 01-22-21    PT Start Time 0935    PT Stop Time 1018    PT Time Calculation (min) 43 min    Equipment Utilized During Treatment Gait belt    Activity Tolerance Patient tolerated treatment well    Behavior During Therapy [facility] for tasks assessed/performed             Past Medical History:  Diagnosis Date   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    COPD (chronic obstructive pulmonary disease) ([place])    Coronary artery disease 1996   Known with prior mild lesion of LAD demonstrated by Cardiac Catheterization in 1996   Edema of foot    She has a history of chronic edema of the left dated back to age 77 when she suufered severe frostbite playing  on the snow as a child   Hypertension    Lower extremity edema    PAC (premature atrial contraction)    Pancreatitis    x2   PONV (postoperative nausea and vomiting)    problems waking up last time 4/16 only time   Saccular aneurysm    She also has 2 known which were stable between the MRA of October2010 and the MRA  of April 2011.   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 Has normal EF.    Past Surgical History:  Procedure Laterality Date   ABDOMINAL HYSTERECTOMY     APPENDECTOMY     CARDIAC CATHETERIZATION  1996   Mild CAD with vasospasm   CARDIOVASCULAR STRESS TEST  12-02-2001   EF 70%   CHOLECYSTECTOMY N/A 03/09/2016   Procedure: LAPAROSCOPIC CHOLECYSTECTOMY WITH INTRAOPERATIVE CHOLANGIOGRAM;  Surgeon: Georganna Skeans, MD;  Location: [place];  Service: General;  Laterality: N/A;   ENDARTERECTOMY Right 08/12/2014   Procedure: RIGHT  COMMON CAROTID ARTERY EXPOSURE FOR INTERVENTIONAL RADIOLOGY PROCEDURE BY DR.DEVASHWAR,Insertion 6 FR sheath;  Surgeon: Elam Dutch, MD;  Location: [place];  Service: Vascular;  Laterality: Right;   ENDARTERECTOMY Right 08/12/2014   Procedure:  CAROTID  EXPOSURE CLOSURE RIGHT NECK, REPAIR RIGHT COMMON CAROTID ARTERY;  Surgeon: Elam Dutch, MD;  Location: [place];  Service: Vascular;  Laterality: Right;   ENDARTERECTOMY Left 11/18/2014   Procedure: CAROTID EXPOSURE;  Surgeon: Elam Dutch, MD;  Location: [place];  Service: Vascular;  Laterality: Left;   ENDARTERECTOMY Left 11/18/2014   Procedure: CLOSURE CAROTID;  Surgeon: Elam Dutch, MD;  Location: [place];  Service: Vascular;  Laterality: Left;  ENDARTERECTOMY Right 08/22/2017   Procedure: EXPOSURE CAROTID ARTERY FOR [place] SURGERY;  Surgeon: Elam Dutch, MD;  Location: [facility];  Service: Vascular;  Laterality: Right;   IR ANGIO INTRA EXTRACRAN SEL INTERNAL CAROTID UNI R MOD SED  08/22/2017   IR ANGIOGRAM FOLLOW UP STUDY  08/22/2017   IR RADIOLOGIST EVAL & MGMT  06/26/2016   IR RADIOLOGIST EVAL & MGMT  05/08/2017   IR RADIOLOGY PERIPHERAL GUIDED IV START  12/03/2017   IR TRANSCATH/EMBOLIZ  08/22/2017   IR US GUIDE VASC ACCESS RIGHT  12/03/2017   LAPAROSCOPY     NECK SURGERY  50 yrs ago   Left side tumor   RADIOLOGY WITH ANESTHESIA N/A 05/25/2014   Procedure: RADIOLOGY WITH ANESTHESIA;  Surgeon: [name], MD;  Location:  [place];  Service: Radiology;  Laterality: N/A;   RADIOLOGY WITH ANESTHESIA N/A 08/12/2014   Procedure: RADIOLOGY WITH ANESTHESIA;  Surgeon: [name], MD;  Location: [place];  Service: Radiology;  Laterality: N/A;   RADIOLOGY WITH ANESTHESIA N/A 03/15/2015   Procedure: MRI OF BRAIN WITHOUT CONTRAST;  Surgeon: Medication Radiologist, MD;  Location: [place];  Service: Radiology;  Laterality: N/A;  DR. TAT/MRI   US ECHOCARDIOGRAPHY  04-26-2009   EF 65-70%   VESICOVAGINAL FISTULA CLOSURE W/ TAH  25 yrs ago   WOUND EXPLORATION Right 08/22/2017   Procedure: REMOVAL OF RIGHT COMMON CAROTID SHEATH AND WOUND CLOSURE;  Surgeon: Rosetta Posner, MD;  Location: [place];  Service: Vascular;  Laterality: Right;    There were no vitals filed for this visit.                      [place] Adult PT Treatment/Exercise - 01/05/21 0001       Transfers   Transfers Sit to Stand;Stand to Sit    Sit to Stand 5: Supervision    Stand to Sit 5: Supervision    Number of Reps 10 reps    Comments no UE support from mat table      Ambulation/Gait   Ambulation/Gait Yes    Ambulation/Gait Assistance 4: Min guard    Ambulation Distance (Feet) 115 Feet    Assistive device Straight cane    Gait Pattern Step-through pattern    Ambulation Surface Level;Indoor    Stairs Yes    Stairs Assistance 5: Supervision    Stair Management Technique One rail Right    Number of Stairs 4    Height of Stairs 6      Knee/Hip Exercises: Standing   Heel Raises Both;1 set;10 reps    Hip Flexion Stengthening;Right;Left;10 reps;Knee bent;Knee straight;2 sets   3# weight used on each leg   Hip Abduction Stengthening;Right;Left;1 set;10 reps   3#   Hip Extension Stengthening;Right;Left;1 set;10 reps;Knee straight   3# weight used on each leg   Forward Step Up Both;1 set;10 reps;Hand Hold: 2;Step Height: 6"    Other Standing Knee Exercises tap ups to 1st step with UE support prn on handrails reps each foot                  Balance Exercises - 01/05/21 0001       Balance Exercises: Standing   Stepping Strategy Anterior;Lateral;5 reps   5 reps each direction each leg without UE support   Step Ups Forward;4 inch;UE support 2   with UE support on RW; stepping up, then down to floor; then stepping up backward onto 4" step and then down to  floor (1 rep for this sequence)   Marching Solid surface;Upper extremity assist 2;10 reps;Static    Other Standing Exercises pt performed stepping strategy - stepping forward/back, out/in, and back/forward 5 reps each with as less UE support prn with CGA                  PT Short Term Goals - 01/05/21 1843       PT SHORT TERM GOAL #1   Title Increase gait velocity to >/= 1.6 ft/sec with RW for incr. gait efficiency.    Baseline 26.03 secs = 1.26 ft/sec with RW;     11/24/20  -  26.18 sec= 1.25 ft/sec with RW    Time 4    Period Weeks    Status Not Met    Target Date 11/26/20      PT SHORT TERM GOAL #2   Title Pt will improve TUG score to </= 28 secs with RW to demo improved functional mobility.    Baseline 32.29 secs with RW; 11/23/2020  - 21.03 sec with RW    Time 4    Period Weeks    Status Achieved    Target Date 11/26/20      PT SHORT TERM GOAL #3   Title Pt will amb. 9' with SPC with CGA for increased safety with household and community mobility.    Time 4    Period Weeks    Status Achieved    Target Date 11/26/20      PT SHORT TERM GOAL #4   Title Independent in HEP for balance, ROM and strengthening.    Time 4    Period Weeks    Status Achieved    Target Date 11/26/20               PT Long Term Goals - 01/04/21 1011       PT LONG TERM GOAL #1   Title Independent in updated HEP for bil. strengthening and balance exercises. (target date 12-24-20) extend til 01-04-21 due to pt not having been in 3 weeks    Time 8    Period Weeks    Status New    Target Date --   extend til 01-14-21     PT LONG TERM GOAL #2   Title Pt  will report ability to transfer in/out of her car in order to be able to return to driving.    Time 8    Period Weeks    Status New      PT LONG TERM GOAL #3   Title Improve TUG score to </= 23 secs with RW to demo improved mobility.    Time 8    Period Weeks    Status New      PT LONG TERM GOAL #4   Title Incr. gait velocity to >/= 2.0 ft/sec with RW for incr. gait efficiency.    Baseline 26.03 secs = 1.26 ft/sec with RW on 10-26-20    Time 8    Period Weeks    Status New      PT LONG TERM GOAL #5   Title Amb. 77' with SPC with SBA on flat, even surface for incr. community accessibility.    Time 8    Period Weeks    Status New                   Plan - 01/05/21 1843     Clinical Impression Statement Pt reported fatigue after  performing step ups onto 6" step 10 reps each leg with bil. UE support.  Pt able to perform stepping strategy exercise anterior and laterally without UE support without LOB.  Pt's mobility and AROM continues to be significantly impacted and limited by lymphedema in her bil. LE's. Cont with POC.    Personal Factors and Comorbidities Comorbidity 2;Past/Current Experience;Time since onset of injury/illness/exacerbation;Transportation    Comorbidities h/o CVA x 2 - April 2016 and Oct. 2010:  TIA  April 2011; Ventricular hypertrophy 04/2009;  COPD:  CAD:  bil. LE edema due to chronic venous insufficiency, HTN, cerebral aneurysm    Examination-Activity Limitations Locomotion Level;Transfers;Bend;Lift;Stand;Stairs;Squat;Caring for Others    Examination-Participation Restrictions Driving;Cleaning;Community Activity;Shop;Meal Prep    Stability/Clinical Decision Making Evolving/Moderate complexity    Rehab Potential Good    PT Frequency 1x / week    PT Duration 8 weeks    PT Treatment/Interventions ADLs/Self Care Home Management;DME Instruction;Gait training;Stair training;Therapeutic activities;Therapeutic exercise;Neuromuscular re-education;Patient/family  education    PT Next Visit Plan Check LTG's and renew; Cont with LE strengthening:  gait train with [place]; SciFit    Consulted and Agree with Plan of Care Patient             Patient will benefit from skilled therapeutic intervention in order to improve the following deficits and impairments:  Difficulty walking, Decreased activity tolerance, Decreased balance, Decreased range of motion, Decreased strength, Increased edema, Impaired flexibility  Visit Diagnosis: Other abnormalities of gait and mobility  Unsteadiness on feet  Muscle weakness (generalized)     Problem List Patient Active Problem List   Diagnosis Date Noted   Edema of both lower extremities due to peripheral venous insufficiency 07/13/2020   Acute cholecystitis 03/07/2016   Small vessel disease, cerebrovascular 05/31/2015   Aneurysm, cerebral, nonruptured 05/31/2015   Dizziness and giddiness 05/31/2015   Altered mental status    Acute CVA (cerebrovascular accident) ([place]) 03/15/2015   Dysphasia 03/12/2015   Thrombocytopenia ([place]) 03/12/2015   Hypokalemia    Endotracheally intubated    Essential hypertension    Intracranial aneurysm 11/18/2014   Respiratory failure ([place])    Cerebral aneurysm    Brain aneurysm    Aphasia    Palmar erythema    CVA (cerebral infarction) 05/02/2014   Aneurysm ([place]) 05/02/2014   Expressive aphasia 05/02/2014   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

## 2021-01-11 ENCOUNTER — Ambulatory Visit: Payer: Medicare Other | Admitting: Physical Therapy

## 2021-01-11 ENCOUNTER — Other Ambulatory Visit: Payer: Self-pay

## 2021-01-11 ENCOUNTER — Encounter: Payer: Self-pay | Admitting: Physical Therapy

## 2021-01-11 DIAGNOSIS — R2681 Unsteadiness on feet: Secondary | ICD-10-CM

## 2021-01-11 DIAGNOSIS — R2689 Other abnormalities of gait and mobility: Secondary | ICD-10-CM | POA: Diagnosis not present

## 2021-01-11 DIAGNOSIS — M6281 Muscle weakness (generalized): Secondary | ICD-10-CM

## 2021-01-12 NOTE — Therapy (Signed)
[facility] [address] [place], [place], [address] Phone: [phone]   Fax:  [phone]  Physical Therapy Treatment  Patient Details  Name: Jeanne Lawson MRN: [id number] Date of Birth: [birth year] Referring Provider (PT): Dr. [name]   Encounter Date: 01/11/2021   PT End of Session - 01/12/21 1531     Visit Number 9    Number of Visits 13    Date for PT Re-Evaluation 02/18/21    Authorization Type [employer] Medicare    Authorization Time Period 10-26-20 - 01-22-21; 01-11-21 - 03-14-21    PT Start Time 1016    PT Stop Time 1103    PT Time Calculation (min) 47 min    Equipment Utilized During Treatment Gait belt    Activity Tolerance Patient tolerated treatment well    Behavior During Therapy [facility] for tasks assessed/performed             Past Medical History:  Diagnosis Date   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    COPD (chronic obstructive pulmonary disease) ([place])    Coronary artery disease 1996   Known with prior mild lesion of LAD demonstrated by Cardiac Catheterization in 1996   Edema of foot    She has a history of chronic edema of the left dated back to age 75 when she suufered severe frostbite playing  on the snow as a child   Hypertension    Lower extremity edema    PAC (premature atrial contraction)    Pancreatitis    x2   PONV (postoperative nausea and vomiting)    problems waking up last time 4/16 only time   Saccular aneurysm    She also has 2 known which were stable between the MRA of October2010 and the MRA  of April 2011.   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 Has  normal EF.    Past Surgical History:  Procedure Laterality Date   ABDOMINAL HYSTERECTOMY     APPENDECTOMY     CARDIAC CATHETERIZATION  1996   Mild CAD with vasospasm   CARDIOVASCULAR STRESS TEST  12-02-2001   EF 70%   CHOLECYSTECTOMY N/A 03/09/2016   Procedure: LAPAROSCOPIC CHOLECYSTECTOMY WITH INTRAOPERATIVE CHOLANGIOGRAM;  Surgeon: Georganna Skeans, MD;  Location: [place];  Service: General;  Laterality: N/A;   ENDARTERECTOMY Right 08/12/2014   Procedure: RIGHT  COMMON CAROTID ARTERY EXPOSURE FOR INTERVENTIONAL RADIOLOGY PROCEDURE BY DR.DEVASHWAR,Insertion 6 FR sheath;  Surgeon: Elam Dutch, MD;  Location: [place];  Service: Vascular;  Laterality: Right;   ENDARTERECTOMY Right 08/12/2014   Procedure:  CAROTID  EXPOSURE CLOSURE RIGHT NECK, REPAIR RIGHT COMMON CAROTID ARTERY;  Surgeon: Elam Dutch, MD;  Location: [place];  Service: Vascular;  Laterality: Right;   ENDARTERECTOMY Left 11/18/2014   Procedure: CAROTID EXPOSURE;  Surgeon: Elam Dutch, MD;  Location: [place];  Service: Vascular;  Laterality: Left;   ENDARTERECTOMY Left 11/18/2014   Procedure: CLOSURE CAROTID;  Surgeon: Elam Dutch, MD;  Location: [place];  Service: Vascular;  Laterality: Left;   ENDARTERECTOMY Right 08/22/2017   Procedure: EXPOSURE CAROTID  ARTERY FOR [place] SURGERY;  Surgeon: Elam Dutch, MD;  Location: [facility];  Service: Vascular;  Laterality: Right;   IR ANGIO INTRA EXTRACRAN SEL INTERNAL CAROTID UNI R MOD SED  08/22/2017   IR ANGIOGRAM FOLLOW UP STUDY  08/22/2017   IR RADIOLOGIST EVAL & MGMT  06/26/2016   IR RADIOLOGIST EVAL & MGMT  05/08/2017   IR RADIOLOGY PERIPHERAL GUIDED IV START  12/03/2017   IR TRANSCATH/EMBOLIZ  08/22/2017   IR US GUIDE VASC ACCESS RIGHT  12/03/2017   LAPAROSCOPY     NECK SURGERY  50 yrs ago   Left side tumor   RADIOLOGY WITH ANESTHESIA N/A 05/25/2014   Procedure: RADIOLOGY WITH ANESTHESIA;  Surgeon: [name], MD;  Location: [place];  Service: Radiology;  Laterality:  N/A;   RADIOLOGY WITH ANESTHESIA N/A 08/12/2014   Procedure: RADIOLOGY WITH ANESTHESIA;  Surgeon: [name], MD;  Location: [place];  Service: Radiology;  Laterality: N/A;   RADIOLOGY WITH ANESTHESIA N/A 03/15/2015   Procedure: MRI OF BRAIN WITHOUT CONTRAST;  Surgeon: Medication Radiologist, MD;  Location: [place];  Service: Radiology;  Laterality: N/A;  DR. TAT/MRI   US ECHOCARDIOGRAPHY  04-26-2009   EF 65-70%   VESICOVAGINAL FISTULA CLOSURE W/ TAH  25 yrs ago   WOUND EXPLORATION Right 08/22/2017   Procedure: REMOVAL OF RIGHT COMMON CAROTID SHEATH AND WOUND CLOSURE;  Surgeon: Rosetta Posner, MD;  Location: [place];  Service: Vascular;  Laterality: Right;    There were no vitals filed for this visit.   Subjective Assessment - 01/11/21 1002     Subjective Pt states she is doing well - is going to the grocery store after PT today    Patient Stated Goals improve walking and balance; "get the swelling down in my legs"    Currently in Pain? No/denies                               [facility] Adult PT Treatment/Exercise - 01/12/21 0001       Transfers   Transfers Sit to Stand;Stand to Sit    Sit to Stand 5: Supervision    Stand to Sit 5: Supervision    Number of Reps 10 reps    Comments no UE support from mat table      Ambulation/Gait   Ambulation/Gait Yes    Ambulation/Gait Assistance 4: Min guard    Ambulation Distance (Feet) 230 Feet    Assistive device Straight cane    Gait Pattern Step-through pattern    Ambulation Surface Level;Indoor    Gait velocity 23.85 secs = 1.37 ft/sec with RW    Stairs Yes    Stairs Assistance 5: Supervision    Stair Management Technique One rail Right    Number of Stairs 4    Height of Stairs 6      Standardized Balance Assessment   Standardized Balance Assessment Timed Up and Go Test      Timed Up and Go Test   TUG Normal TUG    Normal TUG (seconds) 23.59   with RW     Knee/Hip Exercises: Aerobic   Recumbent Bike SciFit level  2.5 x 5" with UE's & LE's      Knee/Hip Exercises: Standing   Heel Raises Both;1 set;10 reps    Hip Flexion Stengthening;Right;Left;10 reps;Knee bent;Knee straight;2 sets   3# weight used on each leg   Hip Abduction Stengthening;Right;Left;1 set;10 reps  3#   Hip Extension Stengthening;Right;Left;1 set;10 reps;Knee straight   3# weight used on each leg   Forward Step Up Both;1 set;10 reps;Hand Hold: 2;Step Height: 6"    Other Standing Knee Exercises tap ups to 1st step with UE support prn on handrails reps each foot                       PT Short Term Goals - 01/11/21 1005       PT SHORT TERM GOAL #1   Title Increase gait velocity to >/= 1.6 ft/sec with RW for incr. gait efficiency.    Baseline 26.03 secs = 1.26 ft/sec with RW;     11/24/20  -  26.18 sec= 1.25 ft/sec with RW    Time 4    Period Weeks    Status Not Met    Target Date 11/26/20      PT SHORT TERM GOAL #2   Title Pt will improve TUG score to </= 28 secs with RW to demo improved functional mobility.    Baseline 32.29 secs with RW; 11/23/2020  - 21.03 sec with RW    Time 4    Period Weeks    Status Achieved    Target Date 11/26/20      PT SHORT TERM GOAL #3   Title Pt will amb. 44' with SPC with CGA for increased safety with household and community mobility.    Time 4    Period Weeks    Status Achieved    Target Date 11/26/20      PT SHORT TERM GOAL #4   Title Independent in HEP for balance, ROM and strengthening.    Time 4    Period Weeks    Status Achieved    Target Date 11/26/20               PT Long Term Goals - 01/11/21 1005       PT LONG TERM GOAL #1   Title Independent in updated HEP for bil. strengthening and balance exercises. (target date 12-24-20) extend til 01-04-21 due to pt not having been in 3 weeks    Baseline pt says she does functional activities such as laundry in addition to some exercises - 01-11-21    Time 8    Period Weeks    Status Achieved    Target Date --    extend til 01-14-21     PT LONG TERM GOAL #2   Title Pt will report ability to transfer in/out of her car in order to be able to return to driving.    Baseline ongoing - 01-11-21    Time 4    Period Weeks    Status On-going    Target Date 02/18/21      PT LONG TERM GOAL #3   Title Improve TUG score to </= 23 secs with RW to demo improved mobility. REVISED LTG:  </= 22 secs with SPC    Baseline 32.29 secs with RW - 10-26-20:   01-11-21   23. 59 secs    Time 8    Period Weeks    Status Revised      PT LONG TERM GOAL #4   Title Incr. gait velocity to >/= 2.0 ft/sec with RW for incr. gait efficiency.    Baseline 26.03 secs = 1.26 ft/sec with RW on 10-26-20;  23.85 secs = 1.37 ft/sec with RW    Time 4    Period Weeks  Status On-going    Target Date 02/18/21      PT LONG TERM GOAL #5   Title Amb. 230' with SPC with SBA on flat, even surface for incr. community accessibility.    Baseline met 01-11-21    Time 8    Period Weeks    Status Achieved                   Plan - 01/12/21 1543     Clinical Impression Statement Pt has met 2/5 LTG's with goal #3 being revised as initial goal closely approximated but not fully met per stated goal.  LTG's #1 and 5 met;  Car transfer is ongoing (LTG #2) as pt states she has not tried car transfer as of current time.  Gait velocity (LTG #4) is not met as gait speed is 1.37 ft/sec with RW.  Pt's lymphedema in bil. LE's continues to limit AROM and flexibility and also decreases speed of movement due to extra weight in bil. legs, feet and ankles.  Cont with POC.    Personal Factors and Comorbidities Comorbidity 2;Past/Current Experience;Time since onset of injury/illness/exacerbation;Transportation    Comorbidities h/o CVA x 2 - April 2016 and Oct. 2010:  TIA  April 2011; Ventricular hypertrophy 04/2009;  COPD:  CAD:  bil. LE edema due to chronic venous insufficiency, HTN, cerebral aneurysm    Examination-Activity Limitations Locomotion  Level;Transfers;Bend;Lift;Stand;Stairs;Squat;Caring for Others    Examination-Participation Restrictions Driving;Cleaning;Community Activity;Shop;Meal Prep    Stability/Clinical Decision Making Evolving/Moderate complexity    Rehab Potential Good    PT Frequency 1x / week    PT Duration 4 weeks    PT Treatment/Interventions ADLs/Self Care Home Management;DME Instruction;Gait training;Stair training;Therapeutic activities;Therapeutic exercise;Neuromuscular re-education;Patient/family education    PT Next Visit Plan Cont with LE strengthening:  gait train with [place]; SciFit    Consulted and Agree with Plan of Care Patient             Patient will benefit from skilled therapeutic intervention in order to improve the following deficits and impairments:  Difficulty walking, Decreased activity tolerance, Decreased balance, Decreased range of motion, Decreased strength, Increased edema, Impaired flexibility  Visit Diagnosis: Other abnormalities of gait and mobility - Plan: PT plan of care cert/re-cert  Unsteadiness on feet - Plan: PT plan of care cert/re-cert  Muscle weakness (generalized) - Plan: PT plan of care cert/re-cert     Problem List Patient Active Problem List   Diagnosis Date Noted   Edema of both lower extremities due to peripheral venous insufficiency 07/13/2020   Acute cholecystitis 03/07/2016   Small vessel disease, cerebrovascular 05/31/2015   Aneurysm, cerebral, nonruptured 05/31/2015   Dizziness and giddiness 05/31/2015   Altered mental status    Acute CVA (cerebrovascular accident) ([place]) 03/15/2015   Dysphasia 03/12/2015   Thrombocytopenia ([place]) 03/12/2015   Hypokalemia    Endotracheally intubated    Essential hypertension    Intracranial aneurysm 11/18/2014   Respiratory failure ([facility])    Cerebral aneurysm    Brain aneurysm    Aphasia    Palmar erythema    CVA (cerebral infarction) 05/02/2014   Aneurysm ([place]) 05/02/2014   Expressive aphasia 05/02/2014    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

## 2021-01-18 ENCOUNTER — Ambulatory Visit: Payer: Medicare Other | Admitting: Physical Therapy

## 2021-01-25 ENCOUNTER — Ambulatory Visit: Payer: Medicare Other | Admitting: Physical Therapy

## 2021-01-30 NOTE — Progress Notes (Deleted)
Cardiology Office Note:    Date:  01/30/2021   ID:  Jeanne Lawson, DOB 04-22-1945, MRN [id number]  PCP:  [name], MD   [facility] Providers Cardiologist:  Ena Dawley, MD {   Referring MD: [name], MD    History of Present Illness:    Jeanne Lawson is a 76 y.o. female with a hx of multiple strokes, hypertension, coronary artery disease with cardiac cath and angioplasty approximately 20 years ago but no stents placement (we do not have record about that procedure) who was previously followed by Dr. Meda Coffee who now returns to clinic for follow-up.   Per review of the record, she has a history of chronically abnormal EKG with anterolateral T wave inversions. She was also found to have enlarging aneurysms of the posterior circulation that was stented by Dr. [name]. 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 She recovered well from this. She is no longer on Plavix but takes ASA daily.   Last seen by Dr. Meda Coffee on 07/22/19 where she was doing okay. Has chronic lymphedema and LE edema and was started on lasix with improvement.  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    COPD (chronic obstructive pulmonary disease) (HCC)    Coronary artery disease 1996   Known with prior mild lesion of LAD demonstrated by Cardiac Catheterization in 1996   Edema of foot    She has a history of chronic edema of the left dated back to age 5 when she suufered severe frostbite playing  on the snow as a child   Hypertension    Lower extremity edema    PAC (premature atrial contraction)    Pancreatitis    x2   PONV (postoperative nausea and vomiting)    problems waking up last time 4/16 only time   Saccular aneurysm    She also has 2 known which were stable between the MRA  of October2010 and the MRA  of April 2011.   Stroke [facility]) 04/2014   She had had a previous thrombotic stroke  involving the right corona radiata in October 2010; left arm and leg weakness   TIA (transient ischemic attack)    She was hospitalized 04-23-09 through 04-27-09 for involving right side of the body   Tobacco abuse    Ongoing    Ventricular hypertrophy 04/2009   LVH with diastolic dysfunction by echo. Has normal EF.    Past Surgical History:  Procedure Laterality Date   ABDOMINAL HYSTERECTOMY     APPENDECTOMY     CARDIAC CATHETERIZATION  1996   Mild CAD with vasospasm   CARDIOVASCULAR STRESS TEST  12-02-2001   EF 70%   CHOLECYSTECTOMY N/A 03/09/2016   Procedure: LAPAROSCOPIC CHOLECYSTECTOMY WITH INTRAOPERATIVE CHOLANGIOGRAM;  Surgeon: Georganna Skeans, MD;  Location: [place];  Service: General;  Laterality: N/A;   ENDARTERECTOMY Right 08/12/2014   Procedure: RIGHT  COMMON CAROTID ARTERY EXPOSURE FOR INTERVENTIONAL RADIOLOGY PROCEDURE BY DR.DEVASHWAR,Insertion 6 FR sheath;  Surgeon: Elam Dutch, MD;  Location: [place];  Service: Vascular;  Laterality: Right;   ENDARTERECTOMY Right 08/12/2014   Procedure:  CAROTID  EXPOSURE CLOSURE RIGHT NECK, REPAIR RIGHT COMMON CAROTID ARTERY;  Surgeon: Elam Dutch, MD;  Location: [place];  Service: Vascular;  Laterality: Right;   ENDARTERECTOMY Left 11/18/2014  Procedure: CAROTID EXPOSURE;  Surgeon: Elam Dutch, MD;  Location: [place];  Service: Vascular;  Laterality: Left;   ENDARTERECTOMY Left 11/18/2014   Procedure: CLOSURE CAROTID;  Surgeon: Elam Dutch, MD;  Location: [place];  Service: Vascular;  Laterality: Left;   ENDARTERECTOMY Right 08/22/2017   Procedure: EXPOSURE CAROTID ARTERY FOR [place] [facility];  Surgeon: Elam Dutch, MD;  Location: [place];  Service: Vascular;  Laterality: Right;   IR ANGIO INTRA EXTRACRAN SEL INTERNAL CAROTID UNI R MOD SED  08/22/2017   IR ANGIOGRAM FOLLOW UP STUDY  08/22/2017   IR RADIOLOGIST EVAL & MGMT   06/26/2016   IR RADIOLOGIST EVAL & MGMT  05/08/2017   IR RADIOLOGY PERIPHERAL GUIDED IV START  12/03/2017   IR TRANSCATH/EMBOLIZ  08/22/2017   IR US GUIDE VASC ACCESS RIGHT  12/03/2017   LAPAROSCOPY     NECK SURGERY  50 yrs ago   Left side tumor   RADIOLOGY WITH ANESTHESIA N/A 05/25/2014   Procedure: RADIOLOGY WITH ANESTHESIA;  Surgeon: [name], MD;  Location: [place];  Service: Radiology;  Laterality: N/A;   RADIOLOGY WITH ANESTHESIA N/A 08/12/2014   Procedure: RADIOLOGY WITH ANESTHESIA;  Surgeon: [name], MD;  Location: [place];  Service: Radiology;  Laterality: N/A;   RADIOLOGY WITH ANESTHESIA N/A 03/15/2015   Procedure: MRI OF BRAIN WITHOUT CONTRAST;  Surgeon: Medication Radiologist, MD;  Location: [place];  Service: Radiology;  Laterality: N/A;  DR. TAT/MRI   US ECHOCARDIOGRAPHY  04-26-2009   EF 65-70%   VESICOVAGINAL FISTULA CLOSURE W/ TAH  25 yrs ago   WOUND EXPLORATION Right 08/22/2017   Procedure: REMOVAL OF RIGHT COMMON CAROTID SHEATH AND WOUND CLOSURE;  Surgeon: Rosetta Posner, MD;  [facility];  Service: Vascular;  Laterality: Right;    Current Medications: No outpatient medications have been marked as taking for the 02/01/21 encounter (Appointment) with Freada Bergeron, MD.     Allergies:   Dilaudid [hydromorphone], Latex, Lidocaine, Sulfa drugs cross reactors, and Codeine   Social History   Socioeconomic History   Marital status: Widowed    Spouse name: Not on file   Number of children: Not on file   Years of education: Not on file   Highest education level: Not on file  Occupational History   Not on file  Tobacco Use   Smoking status: Former    Packs/day: 1.00    Years: 30.00    Pack years: 30.00    Types: Cigarettes    Quit date: 04/26/2014    Years since quitting: 6.7   Smokeless tobacco: Never  Vaping Use   Vaping Use: Never used  Substance and Sexual Activity   Alcohol use: No   Drug use: No   Sexual activity: Not on file  Other Topics  Concern   Not on file  Social History Narrative   Not on file   Social Determinants of Health   Financial Resource Strain: Not on file  Food Insecurity: Not on file  Transportation Needs: Not on file  Physical Activity: Not on file  Stress: Not on file  Social Connections: Not on file     Family History: The patient's ***family history includes Aneurysm in her sister; Diabetes in her father; Emphysema in her father; Heart disease in her mother; Hypertension in her daughter and mother; Stroke in her mother; Thyroid disease in her daughter.  ROS:   Please see the history of present illness.    *** All other systems  reviewed and are negative.  EKGs/Labs/Other Studies Reviewed:    The following studies were reviewed today: TTE 05/03/15: Study Conclusions   - Left ventricle: The cavity size was normal. Wall thickness was    increased in a pattern of moderate LVH. Systolic function was    normal. The estimated ejection fraction was in the range of 60%    to 65%. Wall motion was normal; there were no regional wall    motion abnormalities. Doppler parameters are consistent with    abnormal left ventricular relaxation (grade 1 diastolic    dysfunction).   EKG:  EKG is *** ordered today.  The ekg ordered today demonstrates ***  Recent Labs: 05/11/2020: BUN 14; Creatinine, Ser 0.90; Potassium 4.3; Sodium 140  Recent Lipid Panel    Component Value Date/Time   CHOL 147 11/27/2016 1019   TRIG 92 08/22/2017 2056   HDL 65 11/27/2016 1019   CHOLHDL 2.3 11/27/2016 1019   CHOLHDL 2.1 03/08/2016 0447   VLDL 14 03/08/2016 0447   LDLCALC 56 11/27/2016 1019     Risk Assessment/Calculations:   {Does this patient have ATRIAL FIBRILLATION?[phone]}       Physical Exam:    VS:  There were no vitals taken for this visit.    Wt Readings from Last 3 Encounters:  07/22/19 167 lb (75.8 kg)  05/30/18 158 lb (71.7 kg)  09/06/17 157 lb (71.2 kg)     GEN: *** Well nourished, well  developed in no acute distress HEENT: Normal NECK: No JVD; No carotid bruits LYMPHATICS: No lymphadenopathy CARDIAC: ***RRR, no murmurs, rubs, gallops RESPIRATORY:  Clear to auscultation without rales, wheezing or rhonchi  ABDOMEN: Soft, non-tender, non-distended MUSCULOSKELETAL:  No edema; No deformity  SKIN: Warm and dry NEUROLOGIC:  Alert and oriented x 3 PSYCHIATRIC:  Normal affect   ASSESSMENT:    No diagnosis found. PLAN:    In order of problems listed above:  #Chronic Diastolic HF: Last TTE in 8182 with normal EF 60-65%, G1DD. Now with *** symptoms. Was recommended to start aldactone and repeat TTE at last visit but has not completed. -Continue lasix 40mg  ** -Start spironolactone  -?Farxiga -Low Na diet -??TTE  #CAD: History of angioplasty without stent placement 20 years ago. No records in our system. No anginal symptoms. -Continue ASA 81mg  daily -Continue metop 25mg  XL daily  #History of mulitple strokes: Followed by Neuro. No longer on plavix due to history of bleeding and gastritis. On ASA. -Continue ASA  #Right ICA Aneurysm: Was measuring 8-9 mm in diameter. 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 -Continue ASA as above  #Chronic Lymphedema: -Continue compression therapy -Continue lasix      {Are you ordering a CV Procedure (e.g. stress test, cath, DCCV, TEE, etc)?   Press F2        :[id number]}    Medication Adjustments/Labs and Tests Ordered: Current medicines are reviewed at length with the patient today.  Concerns regarding medicines are outlined above.  No orders of the defined types were placed in this encounter.  No orders of the defined types were placed in this encounter.   There are no Patient Instructions on file for this visit.   Signed, Freada Bergeron, MD  01/30/2021  8:12 AM    [place]

## 2021-02-01 ENCOUNTER — Ambulatory Visit: Payer: Medicare Other | Admitting: Cardiology

## 2021-02-08 ENCOUNTER — Other Ambulatory Visit: Payer: Self-pay

## 2021-02-08 ENCOUNTER — Ambulatory Visit: Payer: Medicare Other | Attending: Internal Medicine | Admitting: Physical Therapy

## 2021-02-08 DIAGNOSIS — R2689 Other abnormalities of gait and mobility: Secondary | ICD-10-CM | POA: Insufficient documentation

## 2021-02-08 DIAGNOSIS — R2681 Unsteadiness on feet: Secondary | ICD-10-CM | POA: Diagnosis present

## 2021-02-08 DIAGNOSIS — M6281 Muscle weakness (generalized): Secondary | ICD-10-CM | POA: Diagnosis present

## 2021-02-09 NOTE — Therapy (Signed)
[facility] [address] [place], [place], [address] Phone: [phone]   Fax:  [phone]  Physical Therapy Treatment & 10th Visit Progress Note   Reporting Period:  10-27-20 - 02-09-21 See below for progress towards goals  Patient Details  Name: Jeanne Lawson MRN: [id number] Date of Birth: April 08, 1945 Referring Provider (PT): Dr. 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)    Coronary artery disease 1996   Known with prior mild lesion of LAD demonstrated by Cardiac Catheterization in 1996   Edema of foot    She has a history of chronic edema of the left dated back to age 53 when she suufered severe frostbite playing  on the snow as a child   Hypertension    Lower extremity edema    PAC (premature atrial contraction)    Pancreatitis    x2   PONV (postoperative nausea and vomiting)    problems waking up last time 4/16 only time   Saccular aneurysm    She also has 2 known which were stable between the MRA of October2010 and the MRA  of April 2011.   Stroke [facility]) 04/2014   She had had a previous thrombotic stroke  involving the right corona radiata in October 2010; left arm and leg weakness   TIA (transient ischemic attack)    She was hospitalized 04-23-09 through 04-27-09 for involving right side of the body    Tobacco abuse    Ongoing    Ventricular hypertrophy 04/2009   LVH with diastolic dysfunction by echo. Has normal EF.    Past Surgical History:  Procedure Laterality Date   ABDOMINAL HYSTERECTOMY     APPENDECTOMY     CARDIAC CATHETERIZATION  1996   Mild CAD with vasospasm   CARDIOVASCULAR STRESS TEST  12-02-2001   EF 70%   CHOLECYSTECTOMY N/A 03/09/2016   Procedure: LAPAROSCOPIC CHOLECYSTECTOMY WITH INTRAOPERATIVE CHOLANGIOGRAM;  Surgeon: Georganna Skeans, MD;  Location: [place];  Service: General;  Laterality: N/A;   ENDARTERECTOMY Right 08/12/2014   Procedure: RIGHT  COMMON CAROTID ARTERY EXPOSURE FOR INTERVENTIONAL RADIOLOGY PROCEDURE BY DR.DEVASHWAR,Insertion 6 FR sheath;  Surgeon: Elam Dutch, MD;  Location: [place];  Service: Vascular;  Laterality: Right;   ENDARTERECTOMY Right 08/12/2014   Procedure:  CAROTID  EXPOSURE CLOSURE RIGHT NECK, REPAIR RIGHT COMMON CAROTID ARTERY;  Surgeon: Elam Dutch, MD;  Location: [place];  Service: Vascular;  Laterality: Right;   ENDARTERECTOMY Left 11/18/2014   Procedure: CAROTID EXPOSURE;  Surgeon: Elam Dutch, MD;  Location: [place];  Service: Vascular;  Laterality: Left;   ENDARTERECTOMY Left 11/18/2014   Procedure: CLOSURE CAROTID;  Surgeon: Elam Dutch, MD;  [facility];  Service: Vascular;  Laterality: Left;   ENDARTERECTOMY Right 08/22/2017   Procedure: EXPOSURE CAROTID ARTERY FOR DEVESHWAR SURGERY;  Surgeon: Elam Dutch, MD;  [facility];  Service: Vascular;  Laterality: Right;   IR ANGIO INTRA EXTRACRAN SEL INTERNAL CAROTID UNI R MOD SED  08/22/2017   IR ANGIOGRAM FOLLOW UP STUDY  08/22/2017   IR RADIOLOGIST EVAL & MGMT  06/26/2016   IR RADIOLOGIST EVAL & MGMT  05/08/2017   IR RADIOLOGY PERIPHERAL GUIDED IV START  12/03/2017   IR TRANSCATH/EMBOLIZ  08/22/2017   IR US GUIDE VASC ACCESS RIGHT  12/03/2017   LAPAROSCOPY     NECK SURGERY  50 yrs ago   Left side tumor   RADIOLOGY WITH ANESTHESIA N/A 05/25/2014   Procedure:  RADIOLOGY WITH ANESTHESIA;  Surgeon: [name], MD;  Location: [place];  Service: Radiology;  Laterality: N/A;   RADIOLOGY WITH ANESTHESIA N/A 08/12/2014   Procedure: RADIOLOGY WITH ANESTHESIA;  Surgeon: [name], MD;  Location: [place];  Service: Radiology;  Laterality: N/A;   RADIOLOGY WITH ANESTHESIA N/A 03/15/2015   Procedure: MRI OF BRAIN WITHOUT CONTRAST;  Surgeon: Medication Radiologist, MD;  Location: [place];  Service: Radiology;  Laterality: N/A;  DR. TAT/MRI   US ECHOCARDIOGRAPHY  04-26-2009   EF 65-70%   VESICOVAGINAL FISTULA CLOSURE W/ TAH  25 yrs ago   WOUND EXPLORATION Right 08/22/2017   Procedure: REMOVAL OF RIGHT COMMON CAROTID SHEATH AND WOUND CLOSURE;  Surgeon: Rosetta Posner, MD;  Location: [place];  Service: Vascular;  Laterality: Right;    There were no vitals filed for this visit.   Subjective Assessment - 02/08/21 0935     Subjective Pt states she is now doing better - was sick with respiratory virus after Christmas- hasn't been to PT since 01-11-21 due to sickness and had another appt scheduled last Tuesday    Patient Stated Goals improve walking and balance; "get the swelling down in my legs"    Currently in Pain? No/denies                               [facility] Adult PT Treatment/Exercise - 02/09/21 0001       Transfers   Transfers Sit to Stand;Stand to Sit    Sit to Stand 5: Supervision    Stand to Sit 5: Supervision    Number of Reps 10 reps    Comments no UE support from mat table      Ambulation/Gait   Ambulation/Gait Yes    Ambulation/Gait Assistance 4: Min guard    Ambulation Distance (Feet) 115 Feet    Assistive device Straight cane    Gait Pattern Step-through pattern    Ambulation Surface Level;Indoor    Stairs Yes    Stairs Assistance 5: Supervision    Stair Management Technique One rail Right    Number of Stairs 4    Height of Stairs 6      Knee/Hip Exercises: Aerobic   Recumbent Bike SciFit level 3.0 x 5" with  UE's & LE's      Knee/Hip Exercises: Standing   Heel Raises Both;1 set;10 reps    Hip Flexion Stengthening;Right;Left;10 reps;Knee bent;Knee straight;2 sets   3# weight used on each leg   Hip Abduction Stengthening;Right;Left;1 set;10 reps   3#   Hip Extension Stengthening;Right;Left;1 set;10 reps;Knee straight   3# weight used on each leg   Forward Step  Up Both;1 set;10 reps;Hand Hold: 2;Step Height: 6"    Other Standing Knee Exercises tap ups to 1st step with UE support prn on handrails reps each foot    Other Standing Knee Exercises Step ups to 8" step inside // bars 5 reps each leg with bil. UE support on // bars                 Balance Exercises - 02/09/21 0001       Balance Exercises: Standing   Rockerboard Anterior/posterior;EO;Other reps (comment);UE support   20 reps   Marching Solid surface;Static;10 reps   with UE support prn on RW- 5 reps with UE support, 5 reps without UE support   Other Standing Exercises pt performed stepping strategy - stepping forward/back, out/in, and back/forward 5 reps each with as less UE support prn with CGA                  PT Short Term Goals - 02/09/21 1616       PT SHORT TERM GOAL #1   Title Increase gait velocity to >/= 1.6 ft/sec with RW for incr. gait efficiency.    Baseline 26.03 secs = 1.26 ft/sec with RW;     11/24/20  -  26.18 sec= 1.25 ft/sec with RW    Time 4    Period Weeks    Status Not Met    Target Date 11/26/20      PT SHORT TERM GOAL #2   Title Pt will improve TUG score to </= 28 secs with RW to demo improved functional mobility.    Baseline 32.29 secs with RW; 11/23/2020  - 21.03 sec with RW    Time 4    Period Weeks    Status Achieved    Target Date 11/26/20      PT SHORT TERM GOAL #3   Title Pt will amb. 34' with SPC with CGA for increased safety with household and community mobility.    Time 4    Period Weeks    Status Achieved    Target Date 11/26/20      PT SHORT TERM GOAL #4   Title  Independent in HEP for balance, ROM and strengthening.    Time 4    Period Weeks    Status Achieved    Target Date 11/26/20               PT Long Term Goals - 02/09/21 1618       PT LONG TERM GOAL #1   Title Independent in updated HEP for bil. strengthening and balance exercises. (target date 12-24-20) extend til 01-04-21 due to pt not having been in 3 weeks    Baseline pt says she does functional activities such as laundry in addition to some exercises - 01-11-21    Time 8    Period Weeks    Status Achieved    Target Date --   extend til 01-14-21     PT LONG TERM GOAL #2   Title Pt will report ability to transfer in/out of her car in order to be able to return to driving.    Baseline ongoing - 01-11-21    Time 4    Period Weeks    Status On-going    Target Date 02/18/21      PT LONG TERM GOAL #3   Title Improve TUG score to </= 23 secs with RW to demo improved mobility. REVISED LTG:  </= 22 secs with  SPC    Baseline 32.29 secs with RW - 10-26-20:   01-11-21   23. 59 secs    Time 8    Period Weeks    Status Revised      PT LONG TERM GOAL #4   Title Incr. gait velocity to >/= 2.0 ft/sec with RW for incr. gait efficiency.    Baseline 26.03 secs = 1.26 ft/sec with RW on 10-26-20;  23.85 secs = 1.37 ft/sec with RW    Time 4    Period Weeks    Status On-going    Target Date 02/18/21      PT LONG TERM GOAL #5   Title Amb. 230' with SPC with SBA on flat, even surface for incr. community accessibility.    Baseline met 01-11-21    Time 8    Period Weeks    Status Achieved                   Plan - 02/09/21 1615     Clinical Impression Statement This 10th visit progress note covers dates 10-27-20 - 02-08-21.  Pt has met STG's #2,3, & 4:  STG #1 not met as gait velocity remains decreased at 1.37 ft/sec with RW (STG not met as goal >/= 1.6 ft/sec with RW).  Pt's edema/lymphedema continues to limit flexibility and AROM in bil. LE's.  Cont with POC.    Personal Factors  and Comorbidities Comorbidity 2;Past/Current Experience;Time since onset of injury/illness/exacerbation;Transportation;Age    Comorbidities h/o CVA x 2 - April 2016 and Oct. 2010:  TIA  April 2011; Ventricular hypertrophy 04/2009;  COPD:  CAD:  bil. LE edema due to chronic venous insufficiency, HTN, cerebral aneurysm    Examination-Activity Limitations Locomotion Level;Transfers;Bend;Lift;Stand;Stairs;Squat;Caring for Others    Examination-Participation Restrictions Driving;Cleaning;Community Activity;Shop;Meal Prep    Stability/Clinical Decision Making Evolving/Moderate complexity    Rehab Potential Good    PT Frequency 1x / week    PT Duration 4 weeks    PT Treatment/Interventions ADLs/Self Care Home Management;DME Instruction;Gait training;Stair training;Therapeutic activities;Therapeutic exercise;Neuromuscular re-education;Patient/family education    PT Next Visit Plan Cont with LE strengthening:  gait train with [place]; SciFit    Consulted and Agree with Plan of Care Patient             Patient will benefit from skilled therapeutic intervention in order to improve the following deficits and impairments:  Difficulty walking, Decreased activity tolerance, Decreased balance, Decreased range of motion, Decreased strength, Increased edema, Impaired flexibility  Visit Diagnosis: Other abnormalities of gait and mobility  Unsteadiness on feet  Muscle weakness (generalized)     Problem List Patient Active Problem List   Diagnosis Date Noted   Edema of both lower extremities due to peripheral venous insufficiency 07/13/2020   Acute cholecystitis 03/07/2016   Small vessel disease, cerebrovascular 05/31/2015   Aneurysm, cerebral, nonruptured 05/31/2015   Dizziness and giddiness 05/31/2015   Altered mental status    Acute CVA (cerebrovascular accident) ([place]) 03/15/2015   Dysphasia 03/12/2015   Thrombocytopenia ([place]) 03/12/2015   Hypokalemia    Endotracheally intubated    Essential  hypertension    Intracranial aneurysm 11/18/2014   Respiratory failure ([facility])    Cerebral aneurysm    Brain aneurysm    Aphasia    Palmar erythema    CVA (cerebral infarction) 05/02/2014   Aneurysm ([place]) 05/02/2014   Expressive aphasia 05/02/2014   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   Tobacco abuse 10/24/2010   TIA (  transient ischemic attack)    Hypertension    Ventricular hypertrophy    CVA 02/23/2010   STRESS FRACTURE, FOOT 02/22/2010    Alda Lea, PT 02/09/2021, 4:23 PM  [facility] [address] [place], [place], [address] Phone: [phone]   Fax:  [phone]  Name: ALIANNY TOELLE MRN: [id number] Date of Birth: Jun 09, 1945

## 2021-02-15 ENCOUNTER — Ambulatory Visit: Payer: Medicare Other | Admitting: Physical Therapy

## 2021-02-15 ENCOUNTER — Other Ambulatory Visit: Payer: Self-pay

## 2021-02-15 DIAGNOSIS — R2689 Other abnormalities of gait and mobility: Secondary | ICD-10-CM | POA: Diagnosis not present

## 2021-02-15 DIAGNOSIS — M6281 Muscle weakness (generalized): Secondary | ICD-10-CM

## 2021-02-15 DIAGNOSIS — R2681 Unsteadiness on feet: Secondary | ICD-10-CM

## 2021-02-16 NOTE — Therapy (Signed)
[facility] [address] [place], [place], [address] Phone: [phone]   Fax:  [phone]  Physical Therapy Treatment  Patient Details  Name: Jeanne Lawson MRN: [id number] Date of Birth: 1945/06/30 Referring Provider (PT): Dr. 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    COPD (chronic obstructive pulmonary disease) ([place])    Coronary artery disease 1996   Known with prior mild lesion of LAD demonstrated by Cardiac Catheterization in 1996   Edema of foot    She has a history of chronic edema of the left dated back to age 67 when she suufered severe frostbite playing  on the snow as a child   Hypertension    Lower extremity edema    PAC (premature atrial contraction)    Pancreatitis    x2   PONV (postoperative nausea and vomiting)    problems waking up last time 4/16 only time   Saccular aneurysm    She also has 2 known which were stable between the MRA of October2010 and the MRA  of April 2011.   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 Has normal EF.    Past Surgical History:  Procedure  Laterality Date   ABDOMINAL HYSTERECTOMY     APPENDECTOMY     CARDIAC CATHETERIZATION  1996   Mild CAD with vasospasm   CARDIOVASCULAR STRESS TEST  12-02-2001   EF 70%   CHOLECYSTECTOMY N/A 03/09/2016   Procedure: LAPAROSCOPIC CHOLECYSTECTOMY WITH INTRAOPERATIVE CHOLANGIOGRAM;  Surgeon: Georganna Skeans, MD;  Location: [place];  Service: General;  Laterality: N/A;   ENDARTERECTOMY Right 08/12/2014   Procedure: RIGHT  COMMON CAROTID ARTERY EXPOSURE FOR INTERVENTIONAL RADIOLOGY PROCEDURE BY DR.DEVASHWAR,Insertion 6 FR sheath;  Surgeon: Elam Dutch, MD;  Location: [place];  Service: Vascular;  Laterality: Right;   ENDARTERECTOMY Right 08/12/2014   Procedure:  CAROTID  EXPOSURE CLOSURE RIGHT NECK, REPAIR RIGHT COMMON CAROTID ARTERY;  Surgeon: Elam Dutch, MD;  Location: [place];  Service: Vascular;  Laterality: Right;   ENDARTERECTOMY Left 11/18/2014   Procedure: CAROTID EXPOSURE;  Surgeon: Elam Dutch, MD;  Location: [place];  Service: Vascular;  Laterality: Left;   ENDARTERECTOMY Left 11/18/2014   Procedure: CLOSURE CAROTID;  Surgeon: Elam Dutch, MD;  Location: [place];  Service: Vascular;  Laterality: Left;   ENDARTERECTOMY Right 08/22/2017   Procedure: EXPOSURE CAROTID ARTERY FOR [place];  Surgeon: Elam Dutch,  MD;  Location[facility];  Service: Vascular;  Laterality: Right;   IR ANGIO INTRA EXTRACRAN SEL INTERNAL CAROTID UNI R MOD SED  08/22/2017   IR ANGIOGRAM FOLLOW UP STUDY  08/22/2017   IR RADIOLOGIST EVAL & MGMT  06/26/2016   IR RADIOLOGIST EVAL & MGMT  05/08/2017   IR RADIOLOGY PERIPHERAL GUIDED IV START  12/03/2017   IR TRANSCATH/EMBOLIZ  08/22/2017   IR US GUIDE VASC ACCESS RIGHT  12/03/2017   LAPAROSCOPY     NECK SURGERY  50 yrs ago   Left side tumor   RADIOLOGY WITH ANESTHESIA N/A 05/25/2014   Procedure: RADIOLOGY WITH ANESTHESIA;  Surgeon: [name], MD;  Location: [place];  Service: Radiology;  Laterality: N/A;   RADIOLOGY WITH ANESTHESIA N/A 08/12/2014    Procedure: RADIOLOGY WITH ANESTHESIA;  Surgeon: [name], MD;  Location: [place];  Service: Radiology;  Laterality: N/A;   RADIOLOGY WITH ANESTHESIA N/A 03/15/2015   Procedure: MRI OF BRAIN WITHOUT CONTRAST;  Surgeon: Medication Radiologist, MD;  Location: [place];  Service: Radiology;  Laterality: N/A;  DR. TAT/MRI   US ECHOCARDIOGRAPHY  04-26-2009   EF 65-70%   VESICOVAGINAL FISTULA CLOSURE W/ TAH  25 yrs ago   WOUND EXPLORATION Right 08/22/2017   Procedure: REMOVAL OF RIGHT COMMON CAROTID SHEATH AND WOUND CLOSURE;  Surgeon: Rosetta Posner, MD;  Location: [place];  Service: Vascular;  Laterality: Right;    There were no vitals filed for this visit.   Subjective Assessment - 02/15/21 0935     Subjective Pt reports she has had a good week - no changes or complaints    Patient Stated Goals improve walking and balance; "get the swelling down in my legs"    Currently in Pain? No/denies                               [facility] Adult PT Treatment/Exercise - 02/16/21 0001       Transfers   Transfers Sit to Stand;Stand to Sit    Sit to Stand 5: Supervision    Number of Reps Other reps (comment);10 reps    Comments with UE support      Ambulation/Gait   Ambulation/Gait Yes    Ambulation/Gait Assistance 4: Min guard    Ambulation Distance (Feet) 115 Feet    Assistive device Straight cane    Gait Pattern Step-through pattern    Ambulation Surface Level;Indoor      Exercises   Exercises Knee/Hip      Knee/Hip Exercises: Aerobic   Recumbent Bike SciFit level 3.5 x 5" with UE's & LE's      Knee/Hip Exercises: Standing   Heel Raises Both;1 set;10 reps   10 reps on RLE (unilateral)   Heel Raises Limitations unable to perform Lt unilateral heel raises due to calf weakness;    Hip Flexion Stengthening;Right;Left;Knee bent;Knee straight;2 sets;20 reps   3# weight used on each leg; 1 set each knee position   Hip Abduction Stengthening;Right;Left;1 set;10 reps   3#   Hip  Extension Stengthening;Right;Left;1 set;10 reps;Knee straight   3# weight used on each leg   Forward Step Up Both;1 set;10 reps;Hand Hold: 2;Step Height: 6"    Other Standing Knee Exercises tap ups to 1st step with UE support prn on handrails reps each foot                 Balance Exercises - 02/16/21 0001  Balance Exercises: Standing   Stepping Strategy Anterior;Posterior;Lateral;5 reps   with UE support on RW prn with CGA   Marching Solid surface;Static;10 reps   with UE support prn on RW- 5 reps with UE support, 5 reps without UE support                 PT Short Term Goals - 02/16/21 1348       PT SHORT TERM GOAL #1   Title Increase gait velocity to >/= 1.6 ft/sec with RW for incr. gait efficiency.    Baseline 26.03 secs = 1.26 ft/sec with RW;     11/24/20  -  26.18 sec= 1.25 ft/sec with RW    Time 4    Period Weeks    Status Not Met    Target Date 11/26/20      PT SHORT TERM GOAL #2   Title Pt will improve TUG score to </= 28 secs with RW to demo improved functional mobility.    Baseline 32.29 secs with RW; 11/23/2020  - 21.03 sec with RW    Time 4    Period Weeks    Status Achieved    Target Date 11/26/20      PT SHORT TERM GOAL #3   Title Pt will amb. 79' with SPC with CGA for increased safety with household and community mobility.    Time 4    Period Weeks    Status Achieved    Target Date 11/26/20      PT SHORT TERM GOAL #4   Title Independent in HEP for balance, ROM and strengthening.    Time 4    Period Weeks    Status Achieved    Target Date 11/26/20               PT Long Term Goals - 02/16/21 1348       PT LONG TERM GOAL #1   Title Independent in updated HEP for bil. strengthening and balance exercises. (target date 12-24-20) extend til 01-04-21 due to pt not having been in 3 weeks    Baseline pt says she does functional activities such as laundry in addition to some exercises - 01-11-21    Time 8    Period Weeks    Status  Achieved    Target Date --   extend til 01-14-21     PT LONG TERM GOAL #2   Title Pt will report ability to transfer in/out of her car in order to be able to return to driving.    Baseline ongoing - 01-11-21    Time 4    Period Weeks    Status On-going    Target Date 02/18/21      PT LONG TERM GOAL #3   Title Improve TUG score to </= 23 secs with RW to demo improved mobility. REVISED LTG:  </= 22 secs with SPC    Baseline 32.29 secs with RW - 10-26-20:   01-11-21   23. 59 secs    Time 8    Period Weeks    Status Revised      PT LONG TERM GOAL #4   Title Incr. gait velocity to >/= 2.0 ft/sec with RW for incr. gait efficiency.    Baseline 26.03 secs = 1.26 ft/sec with RW on 10-26-20;  23.85 secs = 1.37 ft/sec with RW    Time 4    Period Weeks    Status On-going    Target Date 02/18/21  PT LONG TERM GOAL #5   Title Amb. 230' with SPC with SBA on flat, even surface for incr. community accessibility.    Baseline met 01-11-21    Time 8    Period Weeks    Status Achieved                   Plan - 02/16/21 1351     Clinical Impression Statement Pt continues to progress well towards goals but is limited in flexibility and ROM by continued significant lymphedema in bil. LE's.  Pt did have more difficulty with sit to stand transfers in today's session, requiring 1 UE support to assist with standing.  Pt was able to perform sit to stand without UE support in previous session last week.  Cont with POC.    Personal Factors and Comorbidities Comorbidity 2;Past/Current Experience;Time since onset of injury/illness/exacerbation;Transportation;Age    Comorbidities h/o CVA x 2 - April 2016 and Oct. 2010:  TIA  April 2011; Ventricular hypertrophy 04/2009;  COPD:  CAD:  bil. LE edema due to chronic venous insufficiency, HTN, cerebral aneurysm    Examination-Activity Limitations Locomotion Level;Transfers;Bend;Lift;Stand;Stairs;Squat;Caring for Others    Examination-Participation  Restrictions Driving;Cleaning;Community Activity;Shop;Meal Prep    Stability/Clinical Decision Making Evolving/Moderate complexity    Rehab Potential Good    PT Frequency 1x / week    PT Duration 4 weeks    PT Treatment/Interventions ADLs/Self Care Home Management;DME Instruction;Gait training;Stair training;Therapeutic activities;Therapeutic exercise;Neuromuscular re-education;Patient/family education    PT Next Visit Plan Check LTG's - renew for 4 more weeks: Cont with LE strengthening:  gait train with [place]; SciFit    Consulted and Agree with Plan of Care Patient             Patient will benefit from skilled therapeutic intervention in order to improve the following deficits and impairments:  Difficulty walking, Decreased activity tolerance, Decreased balance, Decreased range of motion, Decreased strength, Increased edema, Impaired flexibility  Visit Diagnosis: Other abnormalities of gait and mobility  Unsteadiness on feet  Muscle weakness (generalized)     Problem List Patient Active Problem List   Diagnosis Date Noted   Edema of both lower extremities due to peripheral venous insufficiency 07/13/2020   Acute cholecystitis 03/07/2016   Small vessel disease, cerebrovascular 05/31/2015   Aneurysm, cerebral, nonruptured 05/31/2015   Dizziness and giddiness 05/31/2015   Altered mental status    Acute CVA (cerebrovascular accident) ([place]) 03/15/2015   Dysphasia 03/12/2015   Thrombocytopenia ([place]) 03/12/2015   Hypokalemia    Endotracheally intubated    Essential hypertension    Intracranial aneurysm 11/18/2014   Respiratory failure ([place])    Cerebral aneurysm    Brain aneurysm    Aphasia    Palmar erythema    CVA (cerebral infarction) 05/02/2014   Aneurysm ([place]) 05/02/2014   Expressive aphasia 05/02/2014   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

## 2021-02-22 ENCOUNTER — Other Ambulatory Visit: Payer: Self-pay

## 2021-02-22 ENCOUNTER — Encounter: Payer: Self-pay | Admitting: Physical Therapy

## 2021-02-22 ENCOUNTER — Ambulatory Visit: Payer: Medicare Other | Admitting: Physical Therapy

## 2021-02-22 DIAGNOSIS — R2681 Unsteadiness on feet: Secondary | ICD-10-CM

## 2021-02-22 DIAGNOSIS — R2689 Other abnormalities of gait and mobility: Secondary | ICD-10-CM | POA: Diagnosis not present

## 2021-02-22 DIAGNOSIS — M6281 Muscle weakness (generalized): Secondary | ICD-10-CM

## 2021-02-22 NOTE — Therapy (Addendum)
[facility] [address] [place], [place], [address] Phone: [phone]   Fax:  [phone]  Physical Therapy Treatment  Patient Details  Name: Jeanne Lawson MRN: [id number] Date of Birth: 1945/12/08 Referring Provider (PT): Dr. 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    COPD (chronic obstructive pulmonary disease) ([place])    Coronary artery disease 1996   Known with prior mild lesion of LAD demonstrated by Cardiac Catheterization in 1996   Edema of foot    She has a history of chronic edema of the left dated back to age 21 when she suufered severe frostbite playing  on the snow as a child   Hypertension    Lower extremity edema    PAC (premature atrial contraction)    Pancreatitis    x2   PONV (postoperative nausea and vomiting)    problems waking up last time 4/16 only time   Saccular aneurysm    She also has 2 known which were stable between the MRA of October2010 and the MRA  of April 2011.   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 Has normal EF.    Past Surgical History:  Procedure  Laterality Date   ABDOMINAL HYSTERECTOMY     APPENDECTOMY     CARDIAC CATHETERIZATION  1996   Mild CAD with vasospasm   CARDIOVASCULAR STRESS TEST  12-02-2001   EF 70%   CHOLECYSTECTOMY N/A 03/09/2016   Procedure: LAPAROSCOPIC CHOLECYSTECTOMY WITH INTRAOPERATIVE CHOLANGIOGRAM;  Surgeon: Georganna Skeans, MD;  Location: [facility];  Service: General;  Laterality: N/A;   ENDARTERECTOMY Right 08/12/2014   Procedure: RIGHT  COMMON CAROTID ARTERY EXPOSURE FOR INTERVENTIONAL RADIOLOGY PROCEDURE BY DR.DEVASHWAR,Insertion 6 FR sheath;  Surgeon: Elam Dutch, MD;  Location: [facility];  Service: Vascular;  Laterality: Right;   ENDARTERECTOMY Right 08/12/2014   Procedure:  CAROTID  EXPOSURE CLOSURE RIGHT NECK, REPAIR RIGHT COMMON CAROTID ARTERY;  Surgeon: Elam Dutch, MD;  Location: [facility];  Service: Vascular;  Laterality: Right;   ENDARTERECTOMY Left 11/18/2014   Procedure: CAROTID EXPOSURE;  Surgeon: Elam Dutch, MD;  Location: [facility];  Service: Vascular;  Laterality: Left;   ENDARTERECTOMY Left 11/18/2014   Procedure: CLOSURE CAROTID;  Surgeon: Elam Dutch, MD;  Location: [facility];  Service: Vascular;  Laterality: Left;   ENDARTERECTOMY Right 08/22/2017   Procedure: EXPOSURE CAROTID ARTERY FOR [place];  Surgeon: Elam Dutch,  MD;  [facility];  Service: Vascular;  Laterality: Right;   IR ANGIO INTRA EXTRACRAN SEL INTERNAL CAROTID UNI R MOD SED  08/22/2017   IR ANGIOGRAM FOLLOW UP STUDY  08/22/2017   IR RADIOLOGIST EVAL & MGMT  06/26/2016   IR RADIOLOGIST EVAL & MGMT  05/08/2017   IR RADIOLOGY PERIPHERAL GUIDED IV START  12/03/2017   IR TRANSCATH/EMBOLIZ  08/22/2017   IR US GUIDE VASC ACCESS RIGHT  12/03/2017   LAPAROSCOPY     NECK SURGERY  50 yrs ago   Left side tumor   RADIOLOGY WITH ANESTHESIA N/A 05/25/2014   Procedure: RADIOLOGY WITH ANESTHESIA;  Surgeon: [name], MD;  Location: [facility];  Service: Radiology;  Laterality: N/A;   RADIOLOGY WITH ANESTHESIA N/A 08/12/2014    Procedure: RADIOLOGY WITH ANESTHESIA;  Surgeon: [name], MD;  Location: [facility];  Service: Radiology;  Laterality: N/A;   RADIOLOGY WITH ANESTHESIA N/A 03/15/2015   Procedure: MRI OF BRAIN WITHOUT CONTRAST;  Surgeon: Medication Radiologist, MD;  Location: [facility];  Service: Radiology;  Laterality: N/A;  DR. TAT/MRI   US ECHOCARDIOGRAPHY  04-26-2009   EF 65-70%   VESICOVAGINAL FISTULA CLOSURE W/ TAH  25 yrs ago   WOUND EXPLORATION Right 08/22/2017   Procedure: REMOVAL OF RIGHT COMMON CAROTID SHEATH AND WOUND CLOSURE;  Surgeon: Rosetta Posner, MD;  Location: [facility];  Service: Vascular;  Laterality: Right;    There were no vitals filed for this visit.   Subjective Assessment - 02/22/21 0929     Subjective Pt reports she went to grocery store yesterday and used the cart but it wore her out - feels pretty good today    Patient Stated Goals improve walking and balance; "get the swelling down in my legs"    Currently in Pain? No/denies                               [facility] Adult PT Treatment/Exercise - 02/23/21 0001       Transfers   Transfers Sit to Stand;Stand to Sit    Sit to Stand 5: Supervision    Number of Reps 10 reps    Comments no UE support used - from high low mat table      Ambulation/Gait   Ambulation/Gait Yes    Ambulation/Gait Assistance 4: Min guard    Ambulation Distance (Feet) 100 Feet    Assistive device Straight cane    Gait Pattern Step-through pattern    Ambulation Surface Level;Indoor    Gait velocity 1.4 ft/sec = 23.4 secs with SPC    Number of Stairs 4    Height of Stairs 6      Timed Up and Go Test   Normal TUG (seconds) 19.63   22.72 1st trial ; SPC used with both tests     Exercises   Exercises Knee/Hip      Knee/Hip Exercises: Aerobic   Recumbent Bike SciFit level 3.5 x 5" with UE's & LE's      Knee/Hip Exercises: Standing   Heel Raises --   10 reps on RLE (unilateral)   Hip Flexion Stengthening;Right;Left;Knee bent;Knee  straight;2 sets;20 reps   3# weight used on each leg; 1 set each knee position   Hip Abduction Stengthening;Right;Left;1 set;10 reps   3#   Hip Extension Stengthening;Right;Left;1 set;10 reps;Knee straight   3# weight used on each leg   Forward Step Up Both;1 set;10 reps;Hand Hold:  2;Step Height: 6"    Other Standing Knee Exercises tap ups to 1st step with UE support prn on handrails reps each foot                 Balance Exercises - 02/23/21 0001       Balance Exercises: Standing   Stepping Strategy Anterior;5 reps   no UE support - performed inside // bars   Marching Solid surface;Static;10 reps                  PT Short Term Goals - 02/22/21 1839       PT SHORT TERM GOAL #1   Title Increase gait velocity to >/= 1.6 ft/sec with RW for incr. gait efficiency.    Baseline 26.03 secs = 1.26 ft/sec with RW;     11/24/20  -  26.18 sec= 1.25 ft/sec with RW    Time 4    Period Weeks    Status Not Met    Target Date 11/26/20      PT SHORT TERM GOAL #2   Title Pt will improve TUG score to </= 28 secs with RW to demo improved functional mobility.    Baseline 32.29 secs with RW; 11/23/2020  - 21.03 sec with RW    Time 4    Period Weeks    Status Achieved    Target Date 11/26/20      PT SHORT TERM GOAL #3   Title Pt will amb. 57' with SPC with CGA for increased safety with household and community mobility.    Time 4    Period Weeks    Status Achieved    Target Date 11/26/20      PT SHORT TERM GOAL #4   Title Independent in HEP for balance, ROM and strengthening.    Time 4    Period Weeks    Status Achieved    Target Date 11/26/20               PT Long Term Goals - 02/22/21 0934       PT LONG TERM GOAL #1   Title Independent in updated HEP for bil. strengthening and balance exercises. (target date 12-24-20) extend til 01-04-21 due to pt not having been in 3 weeks    Baseline pt says she does functional activities such as laundry in addition to some  exercises - 01-11-21    Time 4    Period Weeks    Status Achieved    Target Date 12/24/20   extend til 01-14-21     PT LONG TERM GOAL #2   Title Pt will report ability to transfer in/out of her car in order to be able to return to driving.    Baseline ongoing - 01-11-21; pt states she is able to get in and out of her daughter's SUV but hasn't tried getting in and out of her car yet    Time 4    Period Weeks    Status On-going    Target Date 03/22/21      PT LONG TERM GOAL #3   Title Improve TUG score to </= 23 secs with RW to demo improved mobility. REVISED LTG:  </= 17 secs with SPC    Baseline 32.29 secs with RW - 10-26-20:   01-11-21   23. 59 secs:     22.72 secs , 19.63    Time 4    Period Weeks    Status Revised  Target Date 03/22/21      PT LONG TERM GOAL #4   Title Incr. gait velocity to >/= 2.0 ft/sec with RW for incr. gait efficiency.    Baseline 26.03 secs = 1.26 ft/sec with RW on 10-26-20;  23.85 secs = 1.37 ft/sec with RW;   02-22-21 =  23.5, 25.16 = 1.40 ft/sec    Time 4    Period Weeks    Status On-going    Target Date 03/22/21      PT LONG TERM GOAL #5   Title Amb. 230' with SPC with SBA on flat, even surface for incr. community accessibility.  Revised; amb. 350' with RW to demo increased activity tolerance/endurance    Baseline met 01-11-21    Time 4    Period Weeks    Status Revised    Target Date 04/19/21      Additional Long Term Goals   Additional Long Term Goals Yes      PT LONG TERM GOAL #6   Title Negotiate 4 steps with 1 rail only with SBA using step by step sequence.    Time 4    Period Weeks    Status New    Target Date 03/22/21                   Plan - 02/23/21 1500     Clinical Impression Statement Pt has met LTG's #1, 3, & 5:  LTG #2 is ongoing as pt reports she has not yet tried to get in and out of her own car; LTG #4 is not met with gait velocity 1.4 ft/sec with RW, not 2.0 ft/sec per stated LTG - this goal is ongoing.  Pt  will continue to benefit from PT to address gait and balance deficits and LE weakness.    Personal Factors and Comorbidities Comorbidity 2;Past/Current Experience;Time since onset of injury/illness/exacerbation;Transportation;Age    Comorbidities h/o CVA x 2 - April 2016 and Oct. 2010:  TIA  April 2011; Ventricular hypertrophy 04/2009;  COPD:  CAD:  bil. LE edema due to chronic venous insufficiency, HTN, cerebral aneurysm    Examination-Activity Limitations Locomotion Level;Transfers;Bend;Lift;Stand;Stairs;Squat;Caring for Others    Examination-Participation Restrictions Driving;Cleaning;Community Activity;Shop;Meal Prep    Stability/Clinical Decision Making Evolving/Moderate complexity    Rehab Potential Good    PT Frequency 1x / week    PT Duration 4 weeks    PT Treatment/Interventions ADLs/Self Care Home Management;DME Instruction;Gait training;Stair training;Therapeutic activities;Therapeutic exercise;Neuromuscular re-education;Patient/family education    PT Next Visit Plan Cont with LE strengthening:  gait train with SPC; SciFit    Consulted and Agree with Plan of Care Patient             Patient will benefit from skilled therapeutic intervention in order to improve the following deficits and impairments:  Difficulty walking, Decreased activity tolerance, Decreased balance, Decreased range of motion, Decreased strength, Increased edema, Impaired flexibility  Visit Diagnosis: Other abnormalities of gait and mobility - Plan: PT plan of care cert/re-cert  Unsteadiness on feet - Plan: PT plan of care cert/re-cert  Muscle weakness (generalized) - Plan: PT plan of care cert/re-cert     Problem List Patient Active Problem List   Diagnosis Date Noted   Edema of both lower extremities due to peripheral venous insufficiency 07/13/2020   Acute cholecystitis 03/07/2016   Small vessel disease, cerebrovascular 05/31/2015   Aneurysm, cerebral, nonruptured 05/31/2015   Dizziness and  giddiness 05/31/2015   Altered mental status    Acute CVA (cerebrovascular  accident) ([place]) 03/15/2015   Dysphasia 03/12/2015   Thrombocytopenia ([place]) 03/12/2015   Hypokalemia    Endotracheally intubated    Essential hypertension    Intracranial aneurysm 11/18/2014   Respiratory failure ([facility])    Cerebral aneurysm    Brain aneurysm    Aphasia    Palmar erythema    CVA (cerebral infarction) 05/02/2014   Aneurysm ([place]) 05/02/2014   Expressive aphasia 05/02/2014   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   Tobacco abuse 10/24/2010   TIA (transient ischemic attack)    Hypertension    Ventricular hypertrophy    CVA 02/23/2010   STRESS FRACTURE, FOOT 02/22/2010    Alda Lea, PT 02/23/2021, 3:13 PM  [facility] [address] [place], [place], [address] Phone: [phone]   Fax:  [phone]  Name: Jeanne Lawson MRN: [id number] Date of Birth: 31-Oct-1945

## 2021-02-23 NOTE — Addendum Note (Signed)
Addended by: Lamar Benes on: 02/23/2021 03:14 PM   Modules accepted: Orders

## 2021-02-28 ENCOUNTER — Other Ambulatory Visit: Payer: Self-pay

## 2021-02-28 ENCOUNTER — Telehealth: Payer: Self-pay | Admitting: Cardiology

## 2021-02-28 DIAGNOSIS — I2510 Atherosclerotic heart disease of native coronary artery without angina pectoris: Secondary | ICD-10-CM

## 2021-02-28 DIAGNOSIS — I119 Hypertensive heart disease without heart failure: Secondary | ICD-10-CM

## 2021-02-28 DIAGNOSIS — E782 Mixed hyperlipidemia: Secondary | ICD-10-CM

## 2021-02-28 MED ORDER — TOPROL XL 50 MG PO TB24: ORAL_TABLET | ORAL | 0 refills | Status: DC

## 2021-02-28 NOTE — Telephone Encounter (Signed)
°*  STAT* If patient is at the pharmacy, call can be transferred to refill team.   1. Which medications need to be refilled? (please list name of each medication and dose if known) TOPROL XL 50 MG 24 hr tablet  2. Which pharmacy/location (including street and city if local pharmacy) is medication to be sent to? [facility] - [place], [place] - [address]  3. Do they need a 30 day or 90 day supply? [place]

## 2021-03-01 ENCOUNTER — Ambulatory Visit: Payer: Medicare Other | Attending: Internal Medicine | Admitting: Physical Therapy

## 2021-03-01 ENCOUNTER — Other Ambulatory Visit: Payer: Self-pay

## 2021-03-01 DIAGNOSIS — R2689 Other abnormalities of gait and mobility: Secondary | ICD-10-CM | POA: Diagnosis not present

## 2021-03-01 DIAGNOSIS — M6281 Muscle weakness (generalized): Secondary | ICD-10-CM | POA: Diagnosis present

## 2021-03-01 DIAGNOSIS — R2681 Unsteadiness on feet: Secondary | ICD-10-CM | POA: Diagnosis present

## 2021-03-01 MED ORDER — TOPROL XL 50 MG PO TB24: ORAL_TABLET | ORAL | 0 refills | Status: DC

## 2021-03-01 NOTE — Telephone Encounter (Signed)
Pt's medication was sent to pt's pharmacy as requested. Confirmation received.  °

## 2021-03-02 NOTE — Therapy (Signed)
[facility] [address] [place], [place], [address] Phone: [phone]   Fax:  [phone]  Physical Therapy Treatment  Patient Details  Name: Jeanne Lawson MRN: [id number] Date of Birth: Oct 06, 1945 Referring Provider (PT): Dr. 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    COPD (chronic obstructive pulmonary disease) ([place])    Coronary artery disease 1996   Known with prior mild lesion of LAD demonstrated by Cardiac Catheterization in 1996   Edema of foot    She has a history of chronic edema of the left dated back to age 76 when she suufered severe frostbite playing  on the snow as a child   Hypertension    Lower extremity edema    PAC (premature atrial contraction)    Pancreatitis    x2   PONV (postoperative nausea and vomiting)    problems waking up last time 4/16 only time   Saccular aneurysm    She also has 2 known which were stable between the MRA of October2010 and the MRA  of April 2011.   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 Has normal EF.    Past Surgical History:  Procedure  Laterality Date   ABDOMINAL HYSTERECTOMY     APPENDECTOMY     CARDIAC CATHETERIZATION  1996   Mild CAD with vasospasm   CARDIOVASCULAR STRESS TEST  12-02-2001   EF 70%   CHOLECYSTECTOMY N/A 03/09/2016   Procedure: LAPAROSCOPIC CHOLECYSTECTOMY WITH INTRAOPERATIVE CHOLANGIOGRAM;  Surgeon: Georganna Skeans, MD;  Location: [place];  Service: General;  Laterality: N/A;   ENDARTERECTOMY Right 08/12/2014   Procedure: RIGHT  COMMON CAROTID ARTERY EXPOSURE FOR INTERVENTIONAL RADIOLOGY PROCEDURE BY DR.DEVASHWAR,Insertion 6 FR sheath;  Surgeon: Elam Dutch, MD;  Location: [place];  Service: Vascular;  Laterality: Right;   ENDARTERECTOMY Right 08/12/2014   Procedure:  CAROTID  EXPOSURE CLOSURE RIGHT NECK, REPAIR RIGHT COMMON CAROTID ARTERY;  Surgeon: Elam Dutch, MD;  Location: [place];  Service: Vascular;  Laterality: Right;   ENDARTERECTOMY Left 11/18/2014   Procedure: CAROTID EXPOSURE;  Surgeon: Elam Dutch, MD;  Location: [place];  Service: Vascular;  Laterality: Left;   ENDARTERECTOMY Left 11/18/2014   Procedure: CLOSURE CAROTID;  Surgeon: Elam Dutch, MD;  Location: [place];  Service: Vascular;  Laterality: Left;   ENDARTERECTOMY Right 08/22/2017   Procedure: EXPOSURE CAROTID ARTERY FOR [place];  Surgeon: Elam Dutch,  MD;  [facility];  Service: Vascular;  Laterality: Right;   IR ANGIO INTRA EXTRACRAN SEL INTERNAL CAROTID UNI R MOD SED  08/22/2017   IR ANGIOGRAM FOLLOW UP STUDY  08/22/2017   IR RADIOLOGIST EVAL & MGMT  06/26/2016   IR RADIOLOGIST EVAL & MGMT  05/08/2017   IR RADIOLOGY PERIPHERAL GUIDED IV START  12/03/2017   IR TRANSCATH/EMBOLIZ  08/22/2017   IR US GUIDE VASC ACCESS RIGHT  12/03/2017   LAPAROSCOPY     NECK SURGERY  50 yrs ago   Left side tumor   RADIOLOGY WITH ANESTHESIA N/A 05/25/2014   Procedure: RADIOLOGY WITH ANESTHESIA;  Surgeon: [name], MD;  Location: [facility];  Service: Radiology;  Laterality: N/A;   RADIOLOGY WITH ANESTHESIA N/A 08/12/2014    Procedure: RADIOLOGY WITH ANESTHESIA;  Surgeon: [name], MD;  Location: [facility];  Service: Radiology;  Laterality: N/A;   RADIOLOGY WITH ANESTHESIA N/A 03/15/2015   Procedure: MRI OF BRAIN WITHOUT CONTRAST;  Surgeon: Medication Radiologist, MD;  Location: [place];  Service: Radiology;  Laterality: N/A;  DR. TAT/MRI   US ECHOCARDIOGRAPHY  04-26-2009   EF 65-70%   VESICOVAGINAL FISTULA CLOSURE W/ TAH  25 yrs ago   WOUND EXPLORATION Right 08/22/2017   Procedure: REMOVAL OF RIGHT COMMON CAROTID SHEATH AND WOUND CLOSURE;  Surgeon: Rosetta Posner, MD;  Location: [facility];  Service: Vascular;  Laterality: Right;    There were no vitals filed for this visit.   Subjective Assessment - 03/01/21 0941     Subjective Pt reports she took a fluid pill yesterday to try to get "my legs to go down";  no changes or problems reported    Currently in Pain? No/denies                               [facility] Adult PT Treatment/Exercise - 03/02/21 0001       Transfers   Transfers Sit to Stand;Stand to Sit    Sit to Stand 5: Supervision    Number of Reps 10 reps    Comments no UE support used - from mat table      Ambulation/Gait   Ambulation/Gait Yes    Ambulation/Gait Assistance 5: Supervision    Ambulation Distance (Feet) 115 Feet    Assistive device Rolling walker    Gait Pattern Step-through pattern    Ambulation Surface Level;Indoor      Knee/Hip Exercises: Aerobic   Recumbent Bike SciFit level 3.5 x 5" with UE's & LE's      Knee/Hip Exercises: Standing   Heel Raises --   10 reps on RLE (unilateral)   Hip Flexion Stengthening;Right;Left;Knee bent;Knee straight;2 sets;20 reps   3# weight used on each leg; 1 set each knee position   Hip Abduction Stengthening;Right;Left;1 set;10 reps   3#   Hip Extension Stengthening;Right;Left;1 set;10 reps;Knee straight   3# weight used on each leg   Forward Step Up Both;1 set;10 reps;Hand Hold: 2;Step Height: 6"    Other Standing Knee  Exercises tap ups to 6"  step with UE support prn on // bars 10 reps each foot                 Balance Exercises - 03/02/21 0001       Balance Exercises: Standing   Sidestepping 2 reps   10' x 2 reps inside // bars - sidestepping with a squat with UE support  Marching Solid surface   10 reps each leg with minimal UE support on // bars                 PT Short Term Goals - 03/02/21 1445       PT SHORT TERM GOAL #1   Title Increase gait velocity to >/= 1.6 ft/sec with RW for incr. gait efficiency.    Baseline 26.03 secs = 1.26 ft/sec with RW;     11/24/20  -  26.18 sec= 1.25 ft/sec with RW    Time 4    Period Weeks    Status Not Met    Target Date 11/26/20      PT SHORT TERM GOAL #2   Title Pt will improve TUG score to </= 28 secs with RW to demo improved functional mobility.    Baseline 32.29 secs with RW; 11/23/2020  - 21.03 sec with RW    Time 4    Period Weeks    Status Achieved    Target Date 11/26/20      PT SHORT TERM GOAL #3   Title Pt will amb. 66' with SPC with CGA for increased safety with household and community mobility.    Time 4    Period Weeks    Status Achieved    Target Date 11/26/20      PT SHORT TERM GOAL #4   Title Independent in HEP for balance, ROM and strengthening.    Time 4    Period Weeks    Status Achieved    Target Date 11/26/20               PT Long Term Goals - 03/02/21 1445       PT LONG TERM GOAL #1   Title Independent in updated HEP for bil. strengthening and balance exercises. (target date 12-24-20) extend til 01-04-21 due to pt not having been in 3 weeks    Baseline pt says she does functional activities such as laundry in addition to some exercises - 01-11-21    Time 4    Period Weeks    Status Achieved    Target Date 12/24/20   extend til 01-14-21     PT LONG TERM GOAL #2   Title Pt will report ability to transfer in/out of her car in order to be able to return to driving.    Baseline ongoing -  01-11-21; pt states she is able to get in and out of her daughter's SUV but hasn't tried getting in and out of her car yet    Time 4    Period Weeks    Status On-going    Target Date 03/22/21      PT LONG TERM GOAL #3   Title Improve TUG score to </= 23 secs with RW to demo improved mobility. REVISED LTG:  </= 17 secs with SPC    Baseline 32.29 secs with RW - 10-26-20:   01-11-21   23. 59 secs:     22.72 secs , 19.63    Time 4    Period Weeks    Status Revised    Target Date 03/22/21      PT LONG TERM GOAL #4   Title Incr. gait velocity to >/= 2.0 ft/sec with RW for incr. gait efficiency.    Baseline 26.03 secs = 1.26 ft/sec with RW on 10-26-20;  23.85 secs = 1.37 ft/sec with RW;   02-22-21 =  23.5, 25.16 = 1.40 ft/sec  Time 4    Period Weeks    Status On-going    Target Date 03/22/21      PT LONG TERM GOAL #5   Title Amb. 35' with SPC with SBA on flat, even surface for incr. community accessibility.  Revised; amb. 350' with RW to demo increased activity tolerance/endurance    Baseline met 01-11-21    Time 4    Period Weeks    Status Revised    Target Date 04/19/21      PT LONG TERM GOAL #6   Title Negotiate 4 steps with 1 rail only with SBA using step by step sequence.    Time 4    Period Weeks    Status New    Target Date 03/22/21                   Plan - 03/02/21 1442     Clinical Impression Statement Pt noted to have slight decrease in edema in bil. LE's resulting in improved flexibility and AROM.  Pt able to lift LLE onto 6" step inside // bars independently.  Pt cont to have decreased Lt knee flexion and decreased dorsiflexion due to residual weakness from CVA and also due to ROM limitations due to lymphedema in bil. LE's.    Personal Factors and Comorbidities Comorbidity 2;Past/Current Experience;Time since onset of injury/illness/exacerbation;Transportation;Age    Comorbidities h/o CVA x 2 - April 2016 and Oct. 2010:  TIA  April 2011; Ventricular  hypertrophy 04/2009;  COPD:  CAD:  bil. LE edema due to chronic venous insufficiency, HTN, cerebral aneurysm    Examination-Activity Limitations Locomotion Level;Transfers;Bend;Lift;Stand;Stairs;Squat;Caring for Others    Examination-Participation Restrictions Driving;Cleaning;Community Activity;Shop;Meal Prep    Stability/Clinical Decision Making Evolving/Moderate complexity    Rehab Potential Good    PT Frequency 1x / week    PT Duration 4 weeks    PT Treatment/Interventions ADLs/Self Care Home Management;DME Instruction;Gait training;Stair training;Therapeutic activities;Therapeutic exercise;Neuromuscular re-education;Patient/family education    PT Next Visit Plan Cont with LE strengthening:  gait train with [place]; SciFit    Consulted and Agree with Plan of Care Patient             Patient will benefit from skilled therapeutic intervention in order to improve the following deficits and impairments:  Difficulty walking, Decreased activity tolerance, Decreased balance, Decreased range of motion, Decreased strength, Increased edema, Impaired flexibility  Visit Diagnosis: Other abnormalities of gait and mobility  Unsteadiness on feet  Muscle weakness (generalized)     Problem List Patient Active Problem List   Diagnosis Date Noted   Edema of both lower extremities due to peripheral venous insufficiency 07/13/2020   Acute cholecystitis 03/07/2016   Small vessel disease, cerebrovascular 05/31/2015   Aneurysm, cerebral, nonruptured 05/31/2015   Dizziness and giddiness 05/31/2015   Altered mental status    Acute CVA (cerebrovascular accident) ([place]) 03/15/2015   Dysphasia 03/12/2015   Thrombocytopenia ([employer]) 03/12/2015   Hypokalemia    Endotracheally intubated    Essential hypertension    Intracranial aneurysm 11/18/2014   Respiratory failure ([facility])    Cerebral aneurysm    Brain aneurysm    Aphasia    Palmar erythema    CVA (cerebral infarction) 05/02/2014   Aneurysm ([place])  05/02/2014   Expressive aphasia 05/02/2014   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

## 2021-03-08 ENCOUNTER — Ambulatory Visit: Payer: Medicare Other | Admitting: Physical Therapy

## 2021-03-08 ENCOUNTER — Other Ambulatory Visit: Payer: Self-pay

## 2021-03-08 DIAGNOSIS — R2689 Other abnormalities of gait and mobility: Secondary | ICD-10-CM | POA: Diagnosis not present

## 2021-03-08 DIAGNOSIS — M6281 Muscle weakness (generalized): Secondary | ICD-10-CM

## 2021-03-08 DIAGNOSIS — R2681 Unsteadiness on feet: Secondary | ICD-10-CM

## 2021-03-09 NOTE — Therapy (Signed)
[facility] [address] [place], [place], [address] Phone: [phone]   Fax:  [phone]  Physical Therapy Treatment  Patient Details  Name: Jeanne Lawson MRN: [id number] Date of Birth: 19-Dec-1945 Referring Provider (PT): Dr. 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    COPD (chronic obstructive pulmonary disease) ([place])    Coronary artery disease 1996   Known with prior mild lesion of LAD demonstrated by Cardiac Catheterization in 1996   Edema of foot    She has a history of chronic edema of the left dated back to age 8 when she suufered severe frostbite playing  on the snow as a child   Hypertension    Lower extremity edema    PAC (premature atrial contraction)    Pancreatitis    x2   PONV (postoperative nausea and vomiting)    problems waking up last time 4/16 only time   Saccular aneurysm    She also has 2 known which were stable between the MRA of October2010 and the MRA  of April 2011.   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 Has normal EF.    Past Surgical History:  Procedure  Laterality Date   ABDOMINAL HYSTERECTOMY     APPENDECTOMY     CARDIAC CATHETERIZATION  1996   Mild CAD with vasospasm   CARDIOVASCULAR STRESS TEST  12-02-2001   EF 70%   CHOLECYSTECTOMY N/A 03/09/2016   Procedure: LAPAROSCOPIC CHOLECYSTECTOMY WITH INTRAOPERATIVE CHOLANGIOGRAM;  Surgeon: Georganna Skeans, MD;  Location: [facility] [place];  Service: General;  Laterality: N/A;   ENDARTERECTOMY Right 08/12/2014   Procedure: RIGHT  COMMON CAROTID ARTERY EXPOSURE FOR INTERVENTIONAL RADIOLOGY PROCEDURE BY DR.DEVASHWAR,Insertion 6 FR sheath;  Surgeon: Elam Dutch, MD;  Location: [place];  Service: Vascular;  Laterality: Right;   ENDARTERECTOMY Right 08/12/2014   Procedure:  CAROTID  EXPOSURE CLOSURE RIGHT NECK, REPAIR RIGHT COMMON CAROTID ARTERY;  Surgeon: Elam Dutch, MD;  Location: [place];  Service: Vascular;  Laterality: Right;   ENDARTERECTOMY Left 11/18/2014   Procedure: CAROTID EXPOSURE;  Surgeon: Elam Dutch, MD;  Location: [facility];  Service: Vascular;  Laterality: Left;   ENDARTERECTOMY Left 11/18/2014   Procedure: CLOSURE CAROTID;  Surgeon: Elam Dutch, MD;  Location: [place] [facility];  Service: Vascular;  Laterality: Left;   ENDARTERECTOMY Right 08/22/2017   Procedure: EXPOSURE CAROTID ARTERY FOR [place];  Surgeon: Elam Dutch,  MD;  [facility];  Service: Vascular;  Laterality: Right;   IR ANGIO INTRA EXTRACRAN SEL INTERNAL CAROTID UNI R MOD SED  08/22/2017   IR ANGIOGRAM FOLLOW UP STUDY  08/22/2017   IR RADIOLOGIST EVAL & MGMT  06/26/2016   IR RADIOLOGIST EVAL & MGMT  05/08/2017   IR RADIOLOGY PERIPHERAL GUIDED IV START  12/03/2017   IR TRANSCATH/EMBOLIZ  08/22/2017   IR US GUIDE VASC ACCESS RIGHT  12/03/2017   LAPAROSCOPY     NECK SURGERY  50 yrs ago   Left side tumor   RADIOLOGY WITH ANESTHESIA N/A 05/25/2014   Procedure: RADIOLOGY WITH ANESTHESIA;  Surgeon: [name], MD;  Location: [facility];  Service: Radiology;  Laterality: N/A;   RADIOLOGY WITH ANESTHESIA N/A 08/12/2014    Procedure: RADIOLOGY WITH ANESTHESIA;  Surgeon: [name], MD;  Location: [facility];  Service: Radiology;  Laterality: N/A;   RADIOLOGY WITH ANESTHESIA N/A 03/15/2015   Procedure: MRI OF BRAIN WITHOUT CONTRAST;  Surgeon: Medication Radiologist, MD;  Location: [place] [facility];  Service: Radiology;  Laterality: N/A;  DR. TAT/MRI   US ECHOCARDIOGRAPHY  04-26-2009   EF 65-70%   VESICOVAGINAL FISTULA CLOSURE W/ TAH  25 yrs ago   WOUND EXPLORATION Right 08/22/2017   Procedure: REMOVAL OF RIGHT COMMON CAROTID SHEATH AND WOUND CLOSURE;  Surgeon: Rosetta Posner, MD;  Location: [place] [facility];  Service: Vascular;  Laterality: Right;    There were no vitals filed for this visit.   Subjective Assessment - 03/09/21 1009     Subjective Pt reports no problems or changes since previous visit last week    Patient Stated Goals improve walking and balance; "get the swelling down in my legs"    Currently in Pain? No/denies                               [facility] Adult PT Treatment/Exercise - 03/09/21 0001       Transfers   Transfers Sit to Stand;Stand to Sit    Sit to Stand 5: Supervision    Number of Reps 10 reps    Comments no UE support used - from high.low mat table      Ambulation/Gait   Ambulation/Gait Yes    Ambulation/Gait Assistance 5: Supervision    Ambulation Distance (Feet) 75 Feet    Assistive device Rolling walker    Gait Pattern Step-through pattern    Ambulation Surface Level;Indoor      Knee/Hip Exercises: Aerobic   Recumbent Bike SciFit level 3.5 x 5" with UE's & LE's      Knee/Hip Exercises: Standing   Heel Raises Both;1 set;10 reps   with bil. UE support on hand rails   Hip Flexion Stengthening;Right;Left;Knee bent;Knee straight;2 sets;20 reps   3# weight used on each leg; 1 set each knee position   Hip Abduction Stengthening;Right;Left;1 set;10 reps   3#   Hip Extension Stengthening;Right;Left;1 set;10 reps;Knee straight   3# weight used on each leg   Forward Step Up Both;1  set;10 reps;Hand Hold: 2;Step Height: 6"    Other Standing Knee Exercises Tap ups to 8" step inside // bars 5 reps each leg with bil. UE support on //                 Balance Exercises - 03/09/21 0001       Balance Exercises: Standing   Rockerboard Anterior/posterior;EO;10 reps;UE support    Sidestepping 2 reps  Turning --   10' x 2 reps with 1 UE support on // bar   Marching Solid surface   10 reps each leg with minimal UE support on // bars                 PT Short Term Goals - 03/09/21 1019       PT SHORT TERM GOAL #1   Title Increase gait velocity to >/= 1.6 ft/sec with RW for incr. gait efficiency.    Baseline 26.03 secs = 1.26 ft/sec with RW;     11/24/20  -  26.18 sec= 1.25 ft/sec with RW    Time 4    Period Weeks    Status Not Met    Target Date 11/26/20      PT SHORT TERM GOAL #2   Title Pt will improve TUG score to </= 28 secs with RW to demo improved functional mobility.    Baseline 32.29 secs with RW; 11/23/2020  - 21.03 sec with RW    Time 4    Period Weeks    Status Achieved    Target Date 11/26/20      PT SHORT TERM GOAL #3   Title Pt will amb. 102' with SPC with CGA for increased safety with household and community mobility.    Time 4    Period Weeks    Status Achieved    Target Date 11/26/20      PT SHORT TERM GOAL #4   Title Independent in HEP for balance, ROM and strengthening.    Time 4    Period Weeks    Status Achieved    Target Date 11/26/20               PT Long Term Goals - 03/09/21 1019       PT LONG TERM GOAL #1   Title Independent in updated HEP for bil. strengthening and balance exercises. (target date 12-24-20) extend til 01-04-21 due to pt not having been in 3 weeks    Baseline pt says she does functional activities such as laundry in addition to some exercises - 01-11-21    Time 4    Period Weeks    Status Achieved    Target Date 12/24/20   extend til 01-14-21     PT LONG TERM GOAL #2   Title Pt will  report ability to transfer in/out of her car in order to be able to return to driving.    Baseline ongoing - 01-11-21; pt states she is able to get in and out of her daughter's SUV but hasn't tried getting in and out of her car yet    Time 4    Period Weeks    Status On-going    Target Date 03/22/21      PT LONG TERM GOAL #3   Title Improve TUG score to </= 23 secs with RW to demo improved mobility. REVISED LTG:  </= 17 secs with SPC    Baseline 32.29 secs with RW - 10-26-20:   01-11-21   23. 59 secs:     22.72 secs , 19.63    Time 4    Period Weeks    Status Revised    Target Date 03/22/21      PT LONG TERM GOAL #4   Title Incr. gait velocity to >/= 2.0 ft/sec with RW for incr. gait efficiency.    Baseline 26.03 secs = 1.26 ft/sec with RW on 10-26-20;  23.85  secs = 1.37 ft/sec with RW;   02-22-21 =  23.5, 25.16 = 1.40 ft/sec    Time 4    Period Weeks    Status On-going    Target Date 03/22/21      PT LONG TERM GOAL #5   Title Amb. 230' with SPC with SBA on flat, even surface for incr. community accessibility.  Revised; amb. 350' with RW to demo increased activity tolerance/endurance    Baseline met 01-11-21    Time 4    Period Weeks    Status Revised    Target Date 04/19/21      PT LONG TERM GOAL #6   Title Negotiate 4 steps with 1 rail only with SBA using step by step sequence.    Time 4    Period Weeks    Status New    Target Date 03/22/21                   Plan - 03/09/21 1016     Clinical Impression Statement Pt's mobility continues to be limited and impacted by lymphedema in bil LE's.  Pt is progressing slowly but steadily towards LTG's. Pt continues to have more difficulty lifting LLE than RLE due to residual weakness due to old CVA.  Cont with POC. Plan D/C after 2 more sessions.    Personal Factors and Comorbidities Comorbidity 2;Past/Current Experience;Time since onset of injury/illness/exacerbation;Transportation;Age    Comorbidities h/o CVA x 2 - April  2016 and Oct. 2010:  TIA  April 2011; Ventricular hypertrophy 04/2009;  COPD:  CAD:  bil. LE edema due to chronic venous insufficiency, HTN, cerebral aneurysm    Examination-Activity Limitations Locomotion Level;Transfers;Bend;Lift;Stand;Stairs;Squat;Caring for Others    Examination-Participation Restrictions Driving;Cleaning;Community Activity;Shop;Meal Prep    Stability/Clinical Decision Making Evolving/Moderate complexity    Rehab Potential Good    PT Frequency 1x / week    PT Duration 4 weeks    PT Treatment/Interventions ADLs/Self Care Home Management;DME Instruction;Gait training;Stair training;Therapeutic activities;Therapeutic exercise;Neuromuscular re-education;Patient/family education    PT Next Visit Plan Cont with LE strengthening:  gait train with [place]; SciFit    Consulted and Agree with Plan of Care Patient             Patient will benefit from skilled therapeutic intervention in order to improve the following deficits and impairments:  Difficulty walking, Decreased activity tolerance, Decreased balance, Decreased range of motion, Decreased strength, Increased edema, Impaired flexibility  Visit Diagnosis: Other abnormalities of gait and mobility  Unsteadiness on feet  Muscle weakness (generalized)     Problem List Patient Active Problem List   Diagnosis Date Noted   Edema of both lower extremities due to peripheral venous insufficiency 07/13/2020   Acute cholecystitis 03/07/2016   Small vessel disease, cerebrovascular 05/31/2015   Aneurysm, cerebral, nonruptured 05/31/2015   Dizziness and giddiness 05/31/2015   Altered mental status    Acute CVA (cerebrovascular accident) ([place]) 03/15/2015   Dysphasia 03/12/2015   Thrombocytopenia ([place]) 03/12/2015   Hypokalemia    Endotracheally intubated    Essential hypertension    Intracranial aneurysm 11/18/2014   Respiratory failure ([facility])    Cerebral aneurysm    Brain aneurysm    Aphasia    Palmar erythema    CVA  (cerebral infarction) 05/02/2014   Aneurysm ([place]) 05/02/2014   Expressive aphasia 05/02/2014   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

## 2021-03-15 ENCOUNTER — Ambulatory Visit: Payer: Medicare Other | Admitting: Physical Therapy

## 2021-03-15 ENCOUNTER — Other Ambulatory Visit: Payer: Self-pay

## 2021-03-15 DIAGNOSIS — R2681 Unsteadiness on feet: Secondary | ICD-10-CM

## 2021-03-15 DIAGNOSIS — R2689 Other abnormalities of gait and mobility: Secondary | ICD-10-CM

## 2021-03-15 DIAGNOSIS — M6281 Muscle weakness (generalized): Secondary | ICD-10-CM

## 2021-03-16 NOTE — Therapy (Signed)
[facility] [address] [place], [place], [address] Phone: [phone]   Fax:  [phone]  Physical Therapy Treatment  Patient Details  Name: Jeanne Lawson MRN: [id number] Date of Birth: [birth year] Referring Provider (PT): Dr. 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    COPD (chronic obstructive pulmonary disease) ([place])    Coronary artery disease 1996   Known with prior mild lesion of LAD demonstrated by Cardiac Catheterization in 1996   Edema of foot    She has a history of chronic edema of the left dated back to age 40 when she suufered severe frostbite playing  on the snow as a child   Hypertension    Lower extremity edema    PAC (premature atrial contraction)    Pancreatitis    x2   PONV (postoperative nausea and vomiting)    problems waking up last time 4/16 only time   Saccular aneurysm    She also has 2 known which were stable between the MRA of October2010 and the MRA  of April 2011.   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 Has normal EF.    Past Surgical History:  Procedure  Laterality Date   ABDOMINAL HYSTERECTOMY     APPENDECTOMY     CARDIAC CATHETERIZATION  1996   Mild CAD with vasospasm   CARDIOVASCULAR STRESS TEST  12-02-2001   EF 70%   CHOLECYSTECTOMY N/A 03/09/2016   Procedure: LAPAROSCOPIC CHOLECYSTECTOMY WITH INTRAOPERATIVE CHOLANGIOGRAM;  Surgeon: Georganna Skeans, MD;  Location: [place];  Service: General;  Laterality: N/A;   ENDARTERECTOMY Right 08/12/2014   Procedure: RIGHT  COMMON CAROTID ARTERY EXPOSURE FOR INTERVENTIONAL RADIOLOGY PROCEDURE BY DR.DEVASHWAR,Insertion 6 FR sheath;  Surgeon: Elam Dutch, MD;  Location: [place];  Service: Vascular;  Laterality: Right;   ENDARTERECTOMY Right 08/12/2014   Procedure:  CAROTID  EXPOSURE CLOSURE RIGHT NECK, REPAIR RIGHT COMMON CAROTID ARTERY;  Surgeon: Elam Dutch, MD;  Location: [place];  Service: Vascular;  Laterality: Right;   ENDARTERECTOMY Left 11/18/2014   Procedure: CAROTID EXPOSURE;  Surgeon: Elam Dutch, MD;  Location: [place];  Service: Vascular;  Laterality: Left;   ENDARTERECTOMY Left 11/18/2014   Procedure: CLOSURE CAROTID;  Surgeon: Elam Dutch, MD;  Location: [facility];  Service: Vascular;  Laterality: Left;   ENDARTERECTOMY Right 08/22/2017   Procedure: EXPOSURE CAROTID ARTERY FOR [place];  Surgeon: Elam Dutch,  MD;  Location[facility];  Service: Vascular;  Laterality: Right;   IR ANGIO INTRA EXTRACRAN SEL INTERNAL CAROTID UNI R MOD SED  08/22/2017   IR ANGIOGRAM FOLLOW UP STUDY  08/22/2017   IR RADIOLOGIST EVAL & MGMT  06/26/2016   IR RADIOLOGIST EVAL & MGMT  05/08/2017   IR RADIOLOGY PERIPHERAL GUIDED IV START  12/03/2017   IR TRANSCATH/EMBOLIZ  08/22/2017   IR US GUIDE VASC ACCESS RIGHT  12/03/2017   LAPAROSCOPY     NECK SURGERY  50 yrs ago   Left side tumor   RADIOLOGY WITH ANESTHESIA N/A 05/25/2014   Procedure: RADIOLOGY WITH ANESTHESIA;  Surgeon: [name], MD;  Location: [place];  Service: Radiology;  Laterality: N/A;   RADIOLOGY WITH ANESTHESIA N/A 08/12/2014    Procedure: RADIOLOGY WITH ANESTHESIA;  Surgeon: [name], MD;  Location: [facility];  Service: Radiology;  Laterality: N/A;   RADIOLOGY WITH ANESTHESIA N/A 03/15/2015   Procedure: MRI OF BRAIN WITHOUT CONTRAST;  Surgeon: Medication Radiologist, MD;  Location: [place];  Service: Radiology;  Laterality: N/A;  DR. TAT/MRI   US ECHOCARDIOGRAPHY  04-26-2009   EF 65-70%   VESICOVAGINAL FISTULA CLOSURE W/ TAH  25 yrs ago   WOUND EXPLORATION Right 08/22/2017   Procedure: REMOVAL OF RIGHT COMMON CAROTID SHEATH AND WOUND CLOSURE;  Surgeon: Rosetta Posner, MD;  Location: [place];  Service: Vascular;  Laterality: Right;    There were no vitals filed for this visit.   Subjective Assessment - 03/15/21 0939     Subjective Pt reports she thinks the swelling in her legs is going down some but still can't get shoes on    Patient Stated Goals improve walking and balance; "get the swelling down in my legs"    Currently in Pain? No/denies                               [facility] Adult PT Treatment/Exercise - 03/16/21 0001       Transfers   Transfers Sit to Stand;Stand to Sit    Sit to Stand 5: Supervision    Number of Reps 10 reps    Comments no UE support used - from mat table      Ambulation/Gait   Ambulation/Gait Yes    Ambulation/Gait Assistance 5: Supervision    Ambulation Distance (Feet) 115 Feet    Assistive device Straight cane    Gait Pattern Step-through pattern    Ambulation Surface Level;Indoor    Stairs Yes    Stairs Assistance 5: Supervision    Stair Management Technique One rail Right;With cane;Step to pattern;Forwards    Number of Stairs 4    Height of Stairs 6      Knee/Hip Exercises: Aerobic   Recumbent Bike SciFit level 3.5 x 5" with UE's & LE's      Knee/Hip Exercises: Standing   Heel Raises Both;1 set;10 reps   with bil. UE support on hand rails   Hip Flexion Stengthening;Right;Left;Knee bent;Knee straight;2 sets;20 reps   3# weight used on each leg; 1 set  each knee position   Hip Abduction Stengthening;Right;Left;1 set;10 reps   3#   Hip Extension Stengthening;Right;Left;1 set;10 reps;Knee straight   3# weight used on each leg   Forward Step Up Both;1 set;10 reps;Hand Hold: 2;Step Height: 6"    Other Standing Knee Exercises step ups to 8" step inside // bars with RLE 10 reps; tap ups onto 8"  step with LLE 5 reps with bil. UE support on // bars    Other Standing Knee Exercises Tap ups to 8" step inside // bars 5 reps each leg with bil. UE support on //                 Balance Exercises - 03/16/21 0001       Balance Exercises: Standing   Sidestepping 2 reps   with UE support prn on // bars   Step Over Hurdles / Cones stepping over/back of black balance beam 5 reps each leg with UE support on // bars    Marching Solid surface;Static;10 reps    Heel Raises Both;10 reps   with UE support                 PT Short Term Goals - 03/16/21 1240       PT SHORT TERM GOAL #1   Title Increase gait velocity to >/= 1.6 ft/sec with RW for incr. gait efficiency.    Baseline 26.03 secs = 1.26 ft/sec with RW;     11/24/20  -  26.18 sec= 1.25 ft/sec with RW    Time 4    Period Weeks    Status Not Met    Target Date 11/26/20      PT SHORT TERM GOAL #2   Title Pt will improve TUG score to </= 28 secs with RW to demo improved functional mobility.    Baseline 32.29 secs with RW; 11/23/2020  - 21.03 sec with RW    Time 4    Period Weeks    Status Achieved    Target Date 11/26/20      PT SHORT TERM GOAL #3   Title Pt will amb. 28' with SPC with CGA for increased safety with household and community mobility.    Time 4    Period Weeks    Status Achieved    Target Date 11/26/20      PT SHORT TERM GOAL #4   Title Independent in HEP for balance, ROM and strengthening.    Time 4    Period Weeks    Status Achieved    Target Date 11/26/20               PT Long Term Goals - 03/16/21 1240       PT LONG TERM GOAL #1   Title  Independent in updated HEP for bil. strengthening and balance exercises. (target date 12-24-20) extend til 01-04-21 due to pt not having been in 3 weeks    Baseline pt says she does functional activities such as laundry in addition to some exercises - 01-11-21    Time 4    Period Weeks    Status Achieved    Target Date 12/24/20   extend til 01-14-21     PT LONG TERM GOAL #2   Title Pt will report ability to transfer in/out of her car in order to be able to return to driving.    Baseline ongoing - 01-11-21; pt states she is able to get in and out of her daughter's SUV but hasn't tried getting in and out of her car yet    Time 4    Period Weeks    Status On-going    Target Date 03/22/21      PT LONG TERM GOAL #3   Title Improve TUG score to </= 23 secs with RW to demo improved mobility. REVISED LTG:  </= 17 secs  with SPC    Baseline 32.29 secs with RW - 10-26-20:   01-11-21   23. 59 secs:     22.72 secs , 19.63    Time 4    Period Weeks    Status Revised    Target Date 03/22/21      PT LONG TERM GOAL #4   Title Incr. gait velocity to >/= 2.0 ft/sec with RW for incr. gait efficiency.    Baseline 26.03 secs = 1.26 ft/sec with RW on 10-26-20;  23.85 secs = 1.37 ft/sec with RW;   02-22-21 =  23.5, 25.16 = 1.40 ft/sec    Time 4    Period Weeks    Status On-going    Target Date 03/22/21      PT LONG TERM GOAL #5   Title Amb. 230' with SPC with SBA on flat, even surface for incr. community accessibility.  Revised; amb. 350' with RW to demo increased activity tolerance/endurance    Baseline met 01-11-21    Time 4    Period Weeks    Status Revised    Target Date 04/19/21      PT LONG TERM GOAL #6   Title Negotiate 4 steps with 1 rail only with SBA using step by step sequence.    Time 4    Period Weeks    Status New    Target Date 03/22/21                   Plan - 03/16/21 1238     Clinical Impression Statement Session focused on LE strengthening, gait training with use of  SPC and balance training.  Pt's mobility continues to be limited by lymphedema in bil. LE's.  Plan D/C next session as pt is plateauing in maximizing functional progress at this time.    Personal Factors and Comorbidities Comorbidity 2;Past/Current Experience;Time since onset of injury/illness/exacerbation;Transportation;Age    Comorbidities h/o CVA x 2 - April 2016 and Oct. 2010:  TIA  April 2011; Ventricular hypertrophy 04/2009;  COPD:  CAD:  bil. LE edema due to chronic venous insufficiency, HTN, cerebral aneurysm    Examination-Activity Limitations Locomotion Level;Transfers;Bend;Lift;Stand;Stairs;Squat;Caring for Others    Examination-Participation Restrictions Driving;Cleaning;Community Activity;Shop;Meal Prep    Stability/Clinical Decision Making Evolving/Moderate complexity    Rehab Potential Good    PT Frequency 1x / week    PT Duration 4 weeks    PT Treatment/Interventions ADLs/Self Care Home Management;DME Instruction;Gait training;Stair training;Therapeutic activities;Therapeutic exercise;Neuromuscular re-education;Patient/family education    PT Next Visit Plan Cont with LE strengthening:  gait train with SPC; SciFit    Consulted and Agree with Plan of Care Patient             Patient will benefit from skilled therapeutic intervention in order to improve the following deficits and impairments:  Difficulty walking, Decreased activity tolerance, Decreased balance, Decreased range of motion, Decreased strength, Increased edema, Impaired flexibility  Visit Diagnosis: Other abnormalities of gait and mobility  Unsteadiness on feet  Muscle weakness (generalized)     Problem List Patient Active Problem List   Diagnosis Date Noted   Edema of both lower extremities due to peripheral venous insufficiency 07/13/2020   Acute cholecystitis 03/07/2016   Small vessel disease, cerebrovascular 05/31/2015   Aneurysm, cerebral, nonruptured 05/31/2015   Dizziness and giddiness 05/31/2015    Altered mental status    Acute CVA (cerebrovascular accident) ([place]) 03/15/2015   Dysphasia 03/12/2015   Thrombocytopenia ([place]) 03/12/2015   Hypokalemia    Endotracheally  intubated    Essential hypertension    Intracranial aneurysm 11/18/2014   Respiratory failure ([facility])    Cerebral aneurysm    Brain aneurysm    Aphasia    Palmar erythema    CVA (cerebral infarction) 05/02/2014   Aneurysm ([place]) 05/02/2014   Expressive aphasia 05/02/2014   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 [place], [place], [address] Phone: [phone]   Fax:  [phone]  Name: Jeanne Lawson MRN: [id number] Date of Birth: 1945-12-07

## 2021-03-22 ENCOUNTER — Ambulatory Visit: Payer: Medicare Other | Admitting: Physical Therapy

## 2021-03-22 ENCOUNTER — Other Ambulatory Visit: Payer: Self-pay

## 2021-03-22 DIAGNOSIS — M6281 Muscle weakness (generalized): Secondary | ICD-10-CM

## 2021-03-22 DIAGNOSIS — R2689 Other abnormalities of gait and mobility: Secondary | ICD-10-CM

## 2021-03-22 DIAGNOSIS — R2681 Unsteadiness on feet: Secondary | ICD-10-CM

## 2021-03-22 NOTE — Therapy (Signed)
[facility] [address] [place], [place], [address] Phone: [phone]   Fax:  [phone]  Physical Therapy Treatment & Discharge Summary  Patient Details  Name: Jeanne Lawson MRN: [id number] Date of Birth: Dec 03, 1945 Referring Provider (PT): Dr. 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    COPD (chronic obstructive pulmonary disease) ([place])    Coronary artery disease 1996   Known with prior mild lesion of LAD demonstrated by Cardiac Catheterization in 1996   Edema of foot    She has a history of chronic edema of the left dated back to age 87 when she suufered severe frostbite playing  on the snow as a child   Hypertension    Lower extremity edema    PAC (premature atrial contraction)    Pancreatitis    x2   PONV (postoperative nausea and vomiting)    problems waking up last time 4/16 only time   Saccular aneurysm    She also has 2 known which were stable between the MRA of October2010 and the MRA  of April 2011.   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 Has normal EF.    Past Surgical  History:  Procedure Laterality Date   ABDOMINAL HYSTERECTOMY     APPENDECTOMY     CARDIAC CATHETERIZATION  1996   Mild CAD with vasospasm   CARDIOVASCULAR STRESS TEST  12-02-2001   EF 70%   CHOLECYSTECTOMY N/A 03/09/2016   Procedure: LAPAROSCOPIC CHOLECYSTECTOMY WITH INTRAOPERATIVE CHOLANGIOGRAM;  Surgeon: Georganna Skeans, MD;  Location: [facility] [place] [place];  Service: General;  Laterality: N/A;   ENDARTERECTOMY Right 08/12/2014   Procedure: RIGHT  COMMON CAROTID ARTERY EXPOSURE FOR INTERVENTIONAL RADIOLOGY PROCEDURE BY DR.DEVASHWAR,Insertion 6 FR sheath;  Surgeon: Elam Dutch, MD;  Location: [place];  Service: Vascular;  Laterality: Right;   ENDARTERECTOMY Right 08/12/2014   Procedure:  CAROTID  EXPOSURE CLOSURE RIGHT NECK, REPAIR RIGHT COMMON CAROTID ARTERY;  Surgeon: Elam Dutch, MD;  Location: [place];  Service: Vascular;  Laterality: Right;   ENDARTERECTOMY Left 11/18/2014   Procedure: CAROTID EXPOSURE;  Surgeon: Elam Dutch, MD;  Location: [place];  Service: Vascular;  Laterality: Left;   ENDARTERECTOMY Left 11/18/2014   Procedure: CLOSURE CAROTID;  Surgeon: Elam Dutch, MD;  Location: [place];  Service: Vascular;  Laterality: Left;   ENDARTERECTOMY Right 08/22/2017   Procedure: EXPOSURE CAROTID ARTERY FOR [place];  Surgeon:  Elam Dutch, MD;  [facility];  Service: Vascular;  Laterality: Right;   IR ANGIO INTRA EXTRACRAN SEL INTERNAL CAROTID UNI R MOD SED  08/22/2017   IR ANGIOGRAM FOLLOW UP STUDY  08/22/2017   IR RADIOLOGIST EVAL & MGMT  06/26/2016   IR RADIOLOGIST EVAL & MGMT  05/08/2017   IR RADIOLOGY PERIPHERAL GUIDED IV START  12/03/2017   IR TRANSCATH/EMBOLIZ  08/22/2017   IR US GUIDE VASC ACCESS RIGHT  12/03/2017   LAPAROSCOPY     NECK SURGERY  50 yrs ago   Left side tumor   RADIOLOGY WITH ANESTHESIA N/A 05/25/2014   Procedure: RADIOLOGY WITH ANESTHESIA;  Surgeon: [name], MD;  Location: [place];  Service: Radiology;  Laterality: N/A;   RADIOLOGY WITH  ANESTHESIA N/A 08/12/2014   Procedure: RADIOLOGY WITH ANESTHESIA;  Surgeon: [name], MD;  Location: [place];  Service: Radiology;  Laterality: N/A;   RADIOLOGY WITH ANESTHESIA N/A 03/15/2015   Procedure: MRI OF BRAIN WITHOUT CONTRAST;  Surgeon: Medication Radiologist, MD;  Location: [place];  Service: Radiology;  Laterality: N/A;  DR. TAT/MRI   US ECHOCARDIOGRAPHY  04-26-2009   EF 65-70%   VESICOVAGINAL FISTULA CLOSURE W/ TAH  25 yrs ago   WOUND EXPLORATION Right 08/22/2017   Procedure: REMOVAL OF RIGHT COMMON CAROTID SHEATH AND WOUND CLOSURE;  Surgeon: Rosetta Posner, MD;  Location: [place];  Service: Vascular;  Laterality: Right;    There were no vitals filed for this visit.   Subjective Assessment - 03/22/21 1749     Subjective Pt states she realizes the swelling in her legs has impacted her progress - pt is scheduled for D/C from PT today    Patient Stated Goals improve walking and balance; "get the swelling down in my legs"    Currently in Pain? No/denies                               [facility] Adult PT Treatment/Exercise - 03/22/21 0946       Transfers   Transfers Sit to Stand;Stand to Sit    Sit to Stand 5: Supervision    Number of Reps 10 reps    Comments with RUE support from mat table      Ambulation/Gait   Ambulation/Gait Yes    Ambulation/Gait Assistance 5: Supervision    Ambulation Distance (Feet) 115 Feet    Assistive device Straight cane    Gait Pattern Step-through pattern    Ambulation Surface Level;Indoor    Gait velocity 1.39 ft/sec with RW    Stairs Yes    Stairs Assistance 5: Supervision    Stair Management Technique One rail Right    Number of Stairs 4    Height of Stairs 6      Timed Up and Go Test   TUG Normal TUG    Normal TUG (seconds) 26.28   with SPC     Knee/Hip Exercises: Standing   Heel Raises Both;1 set;10 reps   with bil. UE support on hand rails   Hip Flexion Stengthening;Right;Left;Knee bent;Knee straight;2 sets;20  reps   3# weight used on each leg; 1 set each knee position   Hip Abduction Stengthening;Right;Left;1 set;10 reps   3#   Hip Extension Stengthening;Right;Left;1 set;10 reps;Knee straight   3# weight used on each leg   Forward Step Up Both;1 set;10 reps;Hand Hold: 2;Step Height: 6"    Other Standing Knee Exercises step ups  to 8" step inside // bars with RLE 10 reps; tap ups onto 8" step with LLE 5 reps with bil. UE support on // bars    Other Standing Knee Exercises Tap ups to 8" step inside // bars 5 reps each leg with bil. UE support on //                       PT Short Term Goals - 03/22/21 1756       PT SHORT TERM GOAL #1   Title Increase gait velocity to >/= 1.6 ft/sec with RW for incr. gait efficiency.    Baseline 26.03 secs = 1.26 ft/sec with RW;     11/24/20  -  26.18 sec= 1.25 ft/sec with RW    Time 4    Period Weeks    Status Not Met    Target Date 11/26/20      PT SHORT TERM GOAL #2   Title Pt will improve TUG score to </= 28 secs with RW to demo improved functional mobility.    Baseline 32.29 secs with RW; 11/23/2020  - 21.03 sec with RW    Time 4    Period Weeks    Status Achieved    Target Date 11/26/20      PT SHORT TERM GOAL #3   Title Pt will amb. 76' with SPC with CGA for increased safety with household and community mobility.    Time 4    Period Weeks    Status Achieved    Target Date 11/26/20      PT SHORT TERM GOAL #4   Title Independent in HEP for balance, ROM and strengthening.    Time 4    Period Weeks    Status Achieved    Target Date 11/26/20               PT Long Term Goals - 03/22/21 0933       PT LONG TERM GOAL #1   Title Independent in updated HEP for bil. strengthening and balance exercises. (target date 12-24-20) extend til 01-04-21 due to pt not having been in 3 weeks    Baseline pt says she does functional activities such as laundry in addition to some exercises - 01-11-21    Time 4    Period Weeks    Status  Achieved    Target Date 12/24/20   extend til 01-14-21     PT LONG TERM GOAL #2   Title Pt will report ability to transfer in/out of her car in order to be able to return to driving.    Baseline ongoing - 01-11-21; pt states she is able to get in and out of her daughter's SUV but hasn't tried getting in and out of her car yet    Time 4    Period Weeks    Status Not Met    Target Date 03/22/21      PT LONG TERM GOAL #3   Title Improve TUG score to </= 23 secs with RW to demo improved mobility. REVISED LTG:  </= 17 secs with SPC    Baseline 32.29 secs with RW - 10-26-20:   01-11-21   23. 59 secs:     22.72 secs , 19.63;  21.34 ;   26.28 secs with cane    Time 4    Period Weeks    Status Not Met    Target Date 03/22/21      PT  LONG TERM GOAL #4   Title Incr. gait velocity to >/= 2.0 ft/sec with RW for incr. gait efficiency.    Baseline 26.03 secs = 1.26 ft/sec with RW on 10-26-20;  23.85 secs = 1.37 ft/sec with RW;   02-22-21 =  23.5, 25.16 = 1.40 ft/sec;  23.6 secs with RW = 1.39 ft/sec    Time 4    Period Weeks    Status Not Met    Target Date 03/22/21      PT LONG TERM GOAL #5   Title Amb. 230' with SPC with SBA on flat, even surface for incr. community accessibility.  Revised; amb. 350' with RW to demo increased activity tolerance/endurance    Baseline met 01-11-21    Time 4    Period Weeks    Status Achieved    Target Date 03/22/21      PT LONG TERM GOAL #6   Title Negotiate 4 steps with 1 rail only with SBA using step by step sequence.    Time 4    Period Weeks    Status Achieved    Target Date 03/22/21                   Plan - 03/22/21 1802     Clinical Impression Statement Pt has met LTG's #1, 5 and 6:  LTG #2 not met as pt reports she has not yet attempted to transfer into her car as of current time;  LTG #3 not met as TUG score = 26.28 secs with SPC and LTG #4 not met as gait velocity remains 1.4 ft/sec with use of RW. Lymphedema in bil. LE's continues to  limit AROM and flexibility.  Pt is discharged due to plateau in functional progress and end of certification period.    Personal Factors and Comorbidities Comorbidity 2;Past/Current Experience;Time since onset of injury/illness/exacerbation;Transportation;Age    Comorbidities h/o CVA x 2 - April 2016 and Oct. 2010:  TIA  April 2011; Ventricular hypertrophy 04/2009;  COPD:  CAD:  bil. LE edema due to chronic venous insufficiency, HTN, cerebral aneurysm    Examination-Activity Limitations Locomotion Level;Transfers;Bend;Lift;Stand;Stairs;Squat;Caring for Others    Examination-Participation Restrictions Driving;Cleaning;Community Activity;Shop;Meal Prep    Stability/Clinical Decision Making Evolving/Moderate complexity    Rehab Potential Good    PT Frequency 1x / week    PT Duration 4 weeks    PT Treatment/Interventions ADLs/Self Care Home Management;DME Instruction;Gait training;Stair training;Therapeutic activities;Therapeutic exercise;Neuromuscular re-education;Patient/family education    PT Next Visit Plan D/C on 03-22-21    Consulted and Agree with Plan of Care Patient             Patient will benefit from skilled therapeutic intervention in order to improve the following deficits and impairments:  Difficulty walking, Decreased activity tolerance, Decreased balance, Decreased range of motion, Decreased strength, Increased edema, Impaired flexibility  Visit Diagnosis: Other abnormalities of gait and mobility  Unsteadiness on feet  Muscle weakness (generalized)     Problem List Patient Active Problem List   Diagnosis Date Noted   Edema of both lower extremities due to peripheral venous insufficiency 07/13/2020   Acute cholecystitis 03/07/2016   Small vessel disease, cerebrovascular 05/31/2015   Aneurysm, cerebral, nonruptured 05/31/2015   Dizziness and giddiness 05/31/2015   Altered mental status    Acute CVA (cerebrovascular accident) ([place]) 03/15/2015   Dysphasia 03/12/2015    Thrombocytopenia ([place]) 03/12/2015   Hypokalemia    Endotracheally intubated    Essential hypertension    Intracranial aneurysm 11/18/2014  Respiratory failure ([facility])    Cerebral aneurysm    Brain aneurysm    Aphasia    Palmar erythema    CVA (cerebral infarction) 05/02/2014   Aneurysm ([place]) 05/02/2014   Expressive aphasia 05/02/2014   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 Lymphedema in bil. LE's significantly limits AROM and ease with movement Continued decreased standing balance Continued weakness in LLE due to hemiparesis due to h/o old Rt CVA  Education / Equipment: Pt instructed in HEP for strengthening and balance exercises.    Patient agrees to discharge. Patient goals were partially met. Patient is being discharged due to maximized rehab potential.    Alda Lea, PT 03/22/2021, [facility] [address] [place], [place], [address] Phone: [phone]   Fax:  [phone]  Name: Jeanne Lawson MRN: [id number] Date of Birth: 1945/02/12

## 2021-04-15 ENCOUNTER — Emergency Department (HOSPITAL_BASED_OUTPATIENT_CLINIC_OR_DEPARTMENT_OTHER): Payer: Medicare Other

## 2021-04-15 ENCOUNTER — Emergency Department (HOSPITAL_COMMUNITY): Payer: Medicare Other

## 2021-04-15 ENCOUNTER — Inpatient Hospital Stay (HOSPITAL_COMMUNITY)
Admission: EM | Admit: 2021-04-15 | Discharge: 2021-04-22 | DRG: 291 | Disposition: A | Discharge status: Home / Self Care | Payer: Medicare Other | Attending: Internal Medicine | Admitting: Internal Medicine

## 2021-04-15 ENCOUNTER — Encounter (HOSPITAL_COMMUNITY): Payer: Self-pay

## 2021-04-15 ENCOUNTER — Other Ambulatory Visit: Payer: Self-pay

## 2021-04-15 DIAGNOSIS — R4182 Altered mental status, unspecified: Principal | ICD-10-CM

## 2021-04-15 DIAGNOSIS — Z823 Family history of stroke: Secondary | ICD-10-CM

## 2021-04-15 DIAGNOSIS — G9341 Metabolic encephalopathy: Secondary | ICD-10-CM | POA: Diagnosis present

## 2021-04-15 DIAGNOSIS — R609 Edema, unspecified: Secondary | ICD-10-CM

## 2021-04-15 DIAGNOSIS — Z85828 Personal history of other malignant neoplasm of skin: Secondary | ICD-10-CM

## 2021-04-15 DIAGNOSIS — G9340 Encephalopathy, unspecified: Secondary | ICD-10-CM | POA: Diagnosis present

## 2021-04-15 DIAGNOSIS — J449 Chronic obstructive pulmonary disease, unspecified: Secondary | ICD-10-CM | POA: Diagnosis not present

## 2021-04-15 DIAGNOSIS — Z825 Family history of asthma and other chronic lower respiratory diseases: Secondary | ICD-10-CM

## 2021-04-15 DIAGNOSIS — E876 Hypokalemia: Secondary | ICD-10-CM

## 2021-04-15 DIAGNOSIS — I5033 Acute on chronic diastolic (congestive) heart failure: Secondary | ICD-10-CM | POA: Diagnosis present

## 2021-04-15 DIAGNOSIS — Z888 Allergy status to other drugs, medicaments and biological substances status: Secondary | ICD-10-CM

## 2021-04-15 DIAGNOSIS — Z87891 Personal history of nicotine dependence: Secondary | ICD-10-CM | POA: Diagnosis not present

## 2021-04-15 DIAGNOSIS — I872 Venous insufficiency (chronic) (peripheral): Secondary | ICD-10-CM | POA: Diagnosis present

## 2021-04-15 DIAGNOSIS — M25551 Pain in right hip: Secondary | ICD-10-CM | POA: Diagnosis present

## 2021-04-15 DIAGNOSIS — E8809 Other disorders of plasma-protein metabolism, not elsewhere classified: Secondary | ICD-10-CM | POA: Diagnosis present

## 2021-04-15 DIAGNOSIS — Z993 Dependence on wheelchair: Secondary | ICD-10-CM | POA: Diagnosis not present

## 2021-04-15 DIAGNOSIS — M25561 Pain in right knee: Secondary | ICD-10-CM | POA: Diagnosis present

## 2021-04-15 DIAGNOSIS — R262 Difficulty in walking, not elsewhere classified: Secondary | ICD-10-CM | POA: Diagnosis present

## 2021-04-15 DIAGNOSIS — I2510 Atherosclerotic heart disease of native coronary artery without angina pectoris: Secondary | ICD-10-CM | POA: Diagnosis present

## 2021-04-15 DIAGNOSIS — Z8249 Family history of ischemic heart disease and other diseases of the circulatory system: Secondary | ICD-10-CM

## 2021-04-15 DIAGNOSIS — Z885 Allergy status to narcotic agent status: Secondary | ICD-10-CM | POA: Diagnosis not present

## 2021-04-15 DIAGNOSIS — I5031 Acute diastolic (congestive) heart failure: Secondary | ICD-10-CM | POA: Diagnosis not present

## 2021-04-15 DIAGNOSIS — J410 Simple chronic bronchitis: Secondary | ICD-10-CM | POA: Diagnosis not present

## 2021-04-15 DIAGNOSIS — Z8673 Personal history of transient ischemic attack (TIA), and cerebral infarction without residual deficits: Secondary | ICD-10-CM

## 2021-04-15 DIAGNOSIS — I10 Essential (primary) hypertension: Secondary | ICD-10-CM

## 2021-04-15 DIAGNOSIS — Z833 Family history of diabetes mellitus: Secondary | ICD-10-CM | POA: Diagnosis not present

## 2021-04-15 DIAGNOSIS — R600 Localized edema: Secondary | ICD-10-CM

## 2021-04-15 DIAGNOSIS — I110 Hypertensive heart disease with heart failure: Secondary | ICD-10-CM | POA: Diagnosis present

## 2021-04-15 DIAGNOSIS — Z79899 Other long term (current) drug therapy: Secondary | ICD-10-CM

## 2021-04-15 DIAGNOSIS — Z91040 Latex allergy status: Secondary | ICD-10-CM | POA: Diagnosis not present

## 2021-04-15 DIAGNOSIS — M79605 Pain in left leg: Secondary | ICD-10-CM | POA: Diagnosis present

## 2021-04-15 DIAGNOSIS — I890 Lymphedema, not elsewhere classified: Secondary | ICD-10-CM

## 2021-04-15 DIAGNOSIS — Z9181 History of falling: Secondary | ICD-10-CM

## 2021-04-15 DIAGNOSIS — Z7982 Long term (current) use of aspirin: Secondary | ICD-10-CM

## 2021-04-15 DIAGNOSIS — I5082 Biventricular heart failure: Secondary | ICD-10-CM | POA: Diagnosis present

## 2021-04-15 LAB — BASIC METABOLIC PANEL
Anion gap: 7 (ref 5–15)
BUN: 15 mg/dL (ref 8–23)
CO2: 25 mmol/L (ref 22–32)
Calcium: 8.5 mg/dL — ABNORMAL LOW (ref 8.9–10.3)
Chloride: 106 mmol/L (ref 98–111)
Creatinine, Ser: 0.81 mg/dL (ref 0.44–1.00)
GFR, Estimated: >60 mL/min (ref >60)
Glucose, Bld: 93 mg/dL (ref 70–99)
Potassium: 3.7 mmol/L (ref 3.5–5.1)
Sodium: 138 mmol/L (ref 135–145)

## 2021-04-15 LAB — CBC WITH DIFFERENTIAL/PLATELET
Abs Immature Granulocytes: 0.02 10*3/uL (ref 0.00–0.07)
Basophils Absolute: 0 10*3/uL (ref 0.0–0.1)
Basophils Relative: 1 %
Eosinophils Absolute: 0 10*3/uL (ref 0.0–0.5)
Eosinophils Relative: 1 %
HCT: 35.1 % — ABNORMAL LOW (ref 36.0–46.0)
Hemoglobin: 10.7 g/dL — ABNORMAL LOW (ref 12.0–15.0)
Immature Granulocytes: 1 %
Lymphocytes Relative: 27 %
Lymphs Abs: 1.1 10*3/uL (ref 0.7–4.0)
MCH: 27.6 pg (ref 26.0–34.0)
MCHC: 30.5 g/dL (ref 30.0–36.0)
MCV: 90.5 fL (ref 80.0–100.0)
Monocytes Absolute: 0.4 10*3/uL (ref 0.1–1.0)
Monocytes Relative: 11 %
Neutro Abs: 2.3 10*3/uL (ref 1.7–7.7)
Neutrophils Relative %: 59 %
Platelets: 129 10*3/uL — ABNORMAL LOW (ref 150–400)
RBC: 3.88 MIL/uL (ref 3.87–5.11)
RDW: 15.6 % — ABNORMAL HIGH (ref 11.5–15.5)
WBC: 3.9 10*3/uL — ABNORMAL LOW (ref 4.0–10.5)
nRBC: 0 % (ref 0.0–0.2)

## 2021-04-15 LAB — CBG MONITORING, ED: Glucose-Capillary: 116 mg/dL — ABNORMAL HIGH (ref 70–99)

## 2021-04-15 LAB — COMPREHENSIVE METABOLIC PANEL
ALT: 12 U/L (ref 0–44)
AST: 20 U/L (ref 15–41)
Albumin: 3.1 g/dL — ABNORMAL LOW (ref 3.5–5.0)
Alkaline Phosphatase: 43 U/L (ref 38–126)
Anion gap: 5 (ref 5–15)
BUN: 15 mg/dL (ref 8–23)
CO2: 24 mmol/L (ref 22–32)
Calcium: 8.2 mg/dL — ABNORMAL LOW (ref 8.9–10.3)
Chloride: 108 mmol/L (ref 98–111)
Creatinine, Ser: 0.75 mg/dL (ref 0.44–1.00)
GFR, Estimated: >60 mL/min (ref >60)
Glucose, Bld: 94 mg/dL (ref 70–99)
Potassium: 3.5 mmol/L (ref 3.5–5.1)
Sodium: 137 mmol/L (ref 135–145)
Total Bilirubin: 0.9 mg/dL (ref 0.3–1.2)
Total Protein: 5.8 g/dL — ABNORMAL LOW (ref 6.5–8.1)

## 2021-04-15 LAB — CK: Total CK: 203 U/L (ref 38–234)

## 2021-04-15 LAB — AMMONIA: Ammonia: 26 umol/L (ref 9–35)

## 2021-04-15 MED ORDER — ACETAMINOPHEN 650 MG RE SUPP
650.0000 mg | Freq: Four times a day (QID) | RECTAL | Status: DC | PRN
Start: 2021-04-15 — End: 2021-04-22
  Filled 2021-04-15: qty 1

## 2021-04-15 MED ORDER — FUROSEMIDE 10 MG/ML IJ SOLN
40.0000 mg | Freq: Once | INTRAMUSCULAR | Status: AC
  Administered 2021-04-15: 40 mg via INTRAVENOUS
  Filled 2021-04-15: qty 4

## 2021-04-15 MED ORDER — ACETAMINOPHEN 325 MG PO TABS
650.0000 mg | ORAL_TABLET | Freq: Four times a day (QID) | ORAL | Status: DC | PRN
  Administered 2021-04-16 – 2021-04-21 (×3): 650 mg via ORAL
  Filled 2021-04-15 (×3): qty 2

## 2021-04-15 MED ORDER — FUROSEMIDE 10 MG/ML IJ SOLN
40.0000 mg | Freq: Two times a day (BID) | INTRAMUSCULAR | Status: DC
  Administered 2021-04-16 – 2021-04-19 (×8): 40 mg via INTRAVENOUS
  Filled 2021-04-15 (×8): qty 4

## 2021-04-15 MED ORDER — ENOXAPARIN SODIUM 40 MG/0.4ML IJ SOSY
40.0000 mg | PREFILLED_SYRINGE | Freq: Every day | INTRAMUSCULAR | Status: DC
  Administered 2021-04-16 – 2021-04-22 (×7): 40 mg via SUBCUTANEOUS
  Filled 2021-04-15 (×7): qty 0.4

## 2021-04-15 NOTE — ED Provider Triage Note (Signed)
Emergency Medicine Provider Triage Evaluation Note ? ?Jeanne Lawson , a 76 y.o. female  was evaluated in triage.  Pt complains of Bilateral leg pain.  Patient states about 3 nights ago she started experiencing bilateral thigh pain.  She says the pain goes all the way down to her toes.  She is bedbound and wheelchair-bound at baseline because of a recent left knee and left ankle fracture.  She has swelling at baseline from a prior injury and lymphedema.  There is no increased swelling of either of her legs.  No history of blood clots.  Denies any history of injury to her lower back or kidney injury since the knee injury several weeks ago. She comes from nursing facility. ? ?Review of Systems  ?Positive: Leg pain ?Negative:  ? ?Physical Exam  ?There were no vitals taken for this visit. ?Gen:   Awake, no distress   ?Resp:  Normal effort  ?MSK:   Moves extremities without difficulty  ?Other:  Bilateral extreme symmetrical swelling. Apparently this is baseline. She is not very tender to palpation.  ? ?Medical Decision Making  ?Medically screening exam initiated at 2:03 PM.  Appropriate orders placed.  Jeanne Lawson was informed that the remainder of the evaluation will be completed by another provider, this initial triage assessment does not replace that evaluation, and the importance of remaining in the ED until their evaluation is complete. ? ?Will order labs, CK for myalgia, and order bilateral dopplers for dvt ?  ?Adolphus Birchwood, PA-C ?04/15/21 1406 ? ?

## 2021-04-15 NOTE — Progress Notes (Signed)
Bilateral lower extremity venous duplex completed. ?Refer to "CV Proc" under chart review to view preliminary results. ? ?04/15/2021 3:15 PM ?Kelby Aline., MHA, RVT, RDCS, RDMS   ?

## 2021-04-15 NOTE — ED Triage Notes (Signed)
Pt here via EMS from [facility] d/t leg pain X3 days. Severe swelling at baseline, denies new swelling, only increased pain from knee to feet and bottom of feet. Took pain medication at home without relief. ?

## 2021-04-15 NOTE — ED Provider Notes (Signed)
?[facility] ?Provider Note ? ? ?CSN: [id number] ?Arrival date & time: 04/15/21  1352 ? ?  ? ?History ? ?Chief Complaint  ?Patient presents with  ? Leg Pain  ? ? ?Jeanne Lawson is a 76 y.o. female who presents emergency department with chief complaint of bilateral leg pain and edema and altered mental status.  History is given by the patient's daughter and granddaughter who is a Marine scientist here at the hospital at bedside.  They report that she has a history of chronic peripheral edema.  She has been in the past getting Unna boot wraps.  She takes Lasix.  Recently her swelling has become significantly worse to the point where she is having severe difficulty ambulating due to the enormity of her lower extremities and the weight.  She has been taking her regular dose of Lasix without any significant improvement.  She is complaining of severe right knee and hip pain which family states is new.  She does have a history of previous falls back in 2021 2022 which landed her in an independent living facility where she still lives.  Due to the significant swelling in her legs she has been having difficulty completing ADLs and caring for herself.  Patient's daughter also states that during the course of the hospital stay she has become significantly more confused.  Her daughter states that this is completely abnormal for her.  Patient is unable to provide significant history due to altered mentation ? ? ?Leg Pain ? ?  ? ?Home Medications ?Prior to Admission medications   ?Medication Sig Start Date End Date Taking? Authorizing Provider  ?albuterol (PROVENTIL HFA;VENTOLIN HFA) 108 (90 BASE) MCG/ACT inhaler Inhale 1-2 puffs into the lungs every 6 (six) hours as needed for wheezing or shortness of breath.    [provider]  ?aspirin EC 81 MG tablet Take 81 mg by mouth daily with lunch.     [provider]  ?furosemide (LASIX) 40 MG tablet Take 1 tablet (40 mg total) by mouth  daily. 04/28/20   Freada Bergeron, MD  ?HYDROcodone-acetaminophen (NORCO/VICODIN) 5-325 MG tablet Take 1 tablet by mouth every 6 (six) hours as needed for moderate pain. 08/09/20   Newt Minion, MD  ?potassium chloride SA (KLOR-CON) 20 MEQ tablet Take 2 tablets (40 mEq total) by mouth daily. 05/04/20   Freada Bergeron, MD  ?TOPROL XL 50 MG 24 hr tablet TAKE 1/2 TABLET BY MOUTH TWICE DAILY. TAKE WITH OR IMMEDIATELY FOLLOWING A MEAL Please keep upcoming appt with Dr. Johney Frame new Cardiologist in April 2023 before anymore refills. Thank you Final Attempt 03/01/21   Freada Bergeron, MD  ?   ? ?Allergies    ?Dilaudid [hydromorphone], Latex, Lidocaine, Sulfa drugs cross reactors, and Codeine   ? ?Review of Systems   ?Review of Systems ? ?Physical Exam ?Updated Vital Signs ?BP 140/60   Pulse 72   Temp 98.2 ?F (36.8 ?C)   Resp 18   SpO2 100%  ?Physical Exam ?Vitals and nursing note reviewed.  ?Constitutional:   ?   General: She is not in acute distress. ?   Appearance: She is well-developed. She is not diaphoretic.  ?HENT:  ?   Head: Normocephalic and atraumatic.  ?   Right Ear: External ear normal.  ?   Left Ear: External ear normal.  ?   Nose: Nose normal.  ?   Mouth/Throat:  ?   Mouth: Mucous membranes are moist.  ?Eyes:  ?  General: No scleral icterus. ?   Conjunctiva/sclera: Conjunctivae normal.  ?Cardiovascular:  ?   Rate and Rhythm: Normal rate and regular rhythm.  ?   Heart sounds: Normal heart sounds. No murmur heard. ?  No friction rub. No gallop.  ?   Comments: Patient with bilateral pitting edema to the inferior portion of the abdomen.  She has significant skin changes associated with chronic peripheral edema, erythema bilaterally in the lower extremities, no significant tenderness. ?Pulmonary:  ?   Effort: Pulmonary effort is normal. No respiratory distress.  ?   Breath sounds: Normal breath sounds.  ?Abdominal:  ?   General: Bowel sounds are normal. There is no distension.  ?   Palpations:  Abdomen is soft. There is no mass.  ?   Tenderness: There is no abdominal tenderness. There is no guarding.  ?Musculoskeletal:  ?   Cervical back: Normal range of motion.  ?   Right lower leg: Edema present.  ?   Left lower leg: Edema present.  ?   Comments: Pain with passive internal/external rotation of the right hip  ?Skin: ?   General: Skin is warm and dry.  ?Neurological:  ?   Mental Status: She is alert and oriented to person, place, and time.  ?Psychiatric:     ?   Behavior: Behavior normal.  ? ? ?ED Results / Procedures / Treatments   ?Labs ?(all labs ordered are listed, but only abnormal results are displayed) ?Labs Reviewed  ?BASIC METABOLIC PANEL - Abnormal; Notable for the following components:  ?    Result Value  ? Calcium 8.5 (*)   ? All other components within normal limits  ?CBC WITH DIFFERENTIAL/PLATELET - Abnormal; Notable for the following components:  ? WBC 3.9 (*)   ? Hemoglobin 10.7 (*)   ? HCT 35.1 (*)   ? RDW 15.6 (*)   ? Platelets 129 (*)   ? All other components within normal limits  ?CK  ?COMPREHENSIVE METABOLIC PANEL  ?URINALYSIS, ROUTINE W REFLEX MICROSCOPIC  ?AMMONIA  ?CBG MONITORING, ED  ? ? ?EKG ?EKG Interpretation ? ?Date/Time:  Friday April 15 2021 20:25:13 EDT ?Ventricular Rate:  79 ?PR Interval:  135 ?QRS Duration: 100 ?QT Interval:  379 ?QTC Calculation: 435 ?R Axis:   65 ?Text Interpretation: Sinus rhythm Low voltage, precordial leads nonspecific T waves diffusely Confirmed by Sherwood Gambler [phone]) on 04/15/2021 10:01:29 PM ? ?Radiology ?VAS [place] LOWER EXTREMITY VENOUS (DVT) (7a-7p) ? ?Result Date: 04/15/2021 ? Lower Venous DVT Study Patient Name:  ATLAS KUC  Date of Exam:   04/15/2021 Medical Rec #: [id number]       Accession #:    [id number] Date of Birth: 11/29/1945      Patient Gender: F Patient Age:   44 years Exam Location:  [facility] Procedure:      VAS [place] LOWER EXTREMITY VENOUS (DVT) Referring Phys: GRACE LOEFFLER  --------------------------------------------------------------------------------  Indications: Edema.  Limitations: Poor ultrasound/tissue interface, body habitus and depth of vessels. Comparison Study: No prior study Performing Technologist: Maudry Mayhew MHA, RDMS, RVT, RDCS  Examination Guidelines: A complete evaluation includes B-mode imaging, spectral Doppler, color Doppler, and power Doppler as needed of all accessible portions of each vessel. Bilateral testing is considered an integral part of a complete examination. Limited examinations for reoccurring indications may be performed as noted. The reflux portion of the exam is performed with the patient in reverse Trendelenburg.  +---------+---------------+---------+-----------+----------+--------------+ RIGHT    CompressibilityPhasicitySpontaneityPropertiesThrombus Aging +---------+---------------+---------+-----------+----------+--------------+ CFV  Full           Yes      Yes                                 +---------+---------------+---------+-----------+----------+--------------+ SFJ      Full                                                        +---------+---------------+---------+-----------+----------+--------------+ FV Prox  Full                                                        +---------+---------------+---------+-----------+----------+--------------+ FV Mid   Full                                                        +---------+---------------+---------+-----------+----------+--------------+ FV DistalFull                                                        +---------+---------------+---------+-----------+----------+--------------+ PFV      Full                                                        +---------+---------------+---------+-----------+----------+--------------+ POP      Full           Yes      Yes                                  +---------+---------------+---------+-----------+----------+--------------+ PTV      Full                                                        +---------+---------------+---------+-----------+----------+--------------+   Right Technical Findings: Not visualized segments include peroneal veins, limited visualization PTV.  +---------+-----------

## 2021-04-16 ENCOUNTER — Encounter (HOSPITAL_COMMUNITY): Payer: Self-pay | Admitting: Internal Medicine

## 2021-04-16 ENCOUNTER — Inpatient Hospital Stay (HOSPITAL_COMMUNITY): Payer: Medicare Other

## 2021-04-16 DIAGNOSIS — G9340 Encephalopathy, unspecified: Secondary | ICD-10-CM | POA: Diagnosis not present

## 2021-04-16 DIAGNOSIS — I5033 Acute on chronic diastolic (congestive) heart failure: Secondary | ICD-10-CM

## 2021-04-16 DIAGNOSIS — R4182 Altered mental status, unspecified: Secondary | ICD-10-CM

## 2021-04-16 DIAGNOSIS — J410 Simple chronic bronchitis: Secondary | ICD-10-CM | POA: Diagnosis not present

## 2021-04-16 DIAGNOSIS — J449 Chronic obstructive pulmonary disease, unspecified: Secondary | ICD-10-CM | POA: Diagnosis present

## 2021-04-16 HISTORY — DX: Acute on chronic diastolic (congestive) heart failure: I50.33

## 2021-04-16 LAB — COMPREHENSIVE METABOLIC PANEL
ALT: 14 U/L (ref 0–44)
AST: 21 U/L (ref 15–41)
Albumin: 3.3 g/dL — ABNORMAL LOW (ref 3.5–5.0)
Alkaline Phosphatase: 46 U/L (ref 38–126)
Anion gap: 8 (ref 5–15)
BUN: 12 mg/dL (ref 8–23)
CO2: 28 mmol/L (ref 22–32)
Calcium: 8.6 mg/dL — ABNORMAL LOW (ref 8.9–10.3)
Chloride: 104 mmol/L (ref 98–111)
Creatinine, Ser: 0.91 mg/dL (ref 0.44–1.00)
GFR, Estimated: >60 mL/min (ref >60)
Glucose, Bld: 100 mg/dL — ABNORMAL HIGH (ref 70–99)
Potassium: 3.5 mmol/L (ref 3.5–5.1)
Sodium: 140 mmol/L (ref 135–145)
Total Bilirubin: 1.3 mg/dL — ABNORMAL HIGH (ref 0.3–1.2)
Total Protein: 6.3 g/dL — ABNORMAL LOW (ref 6.5–8.1)

## 2021-04-16 LAB — CBC WITH DIFFERENTIAL/PLATELET
Abs Immature Granulocytes: 0.02 10*3/uL (ref 0.00–0.07)
Basophils Absolute: 0 10*3/uL (ref 0.0–0.1)
Basophils Relative: 1 %
Eosinophils Absolute: 0 10*3/uL (ref 0.0–0.5)
Eosinophils Relative: 0 %
HCT: 34.7 % — ABNORMAL LOW (ref 36.0–46.0)
Hemoglobin: 11 g/dL — ABNORMAL LOW (ref 12.0–15.0)
Immature Granulocytes: 0 %
Lymphocytes Relative: 20 %
Lymphs Abs: 0.9 10*3/uL (ref 0.7–4.0)
MCH: 27.6 pg (ref 26.0–34.0)
MCHC: 31.7 g/dL (ref 30.0–36.0)
MCV: 87.2 fL (ref 80.0–100.0)
Monocytes Absolute: 0.5 10*3/uL (ref 0.1–1.0)
Monocytes Relative: 10 %
Neutro Abs: 3.1 10*3/uL (ref 1.7–7.7)
Neutrophils Relative %: 69 %
Platelets: 109 10*3/uL — ABNORMAL LOW (ref 150–400)
RBC: 3.98 MIL/uL (ref 3.87–5.11)
RDW: 15.6 % — ABNORMAL HIGH (ref 11.5–15.5)
WBC: 4.5 10*3/uL (ref 4.0–10.5)
nRBC: 0 % (ref 0.0–0.2)

## 2021-04-16 LAB — I-STAT VENOUS BLOOD GAS, ED
Acid-Base Excess: 5 mmol/L — ABNORMAL HIGH (ref 0.0–2.0)
Bicarbonate: 29.6 mmol/L — ABNORMAL HIGH (ref 20.0–28.0)
Calcium, Ion: 1.09 mmol/L — ABNORMAL LOW (ref 1.15–1.40)
HCT: 30 % — ABNORMAL LOW (ref 36.0–46.0)
Hemoglobin: 10.2 g/dL — ABNORMAL LOW (ref 12.0–15.0)
O2 Saturation: 84 %
Potassium: 3.4 mmol/L — ABNORMAL LOW (ref 3.5–5.1)
Sodium: 140 mmol/L (ref 135–145)
TCO2: 31 mmol/L (ref 22–32)
pCO2, Ven: 44.4 mmHg (ref 44–60)
pH, Ven: 7.433 — ABNORMAL HIGH (ref 7.25–7.43)
pO2, Ven: 48 mmHg — ABNORMAL HIGH (ref 32–45)

## 2021-04-16 LAB — URINALYSIS, ROUTINE W REFLEX MICROSCOPIC
Bilirubin Urine: NEGATIVE
Glucose, UA: NEGATIVE mg/dL
Ketones, ur: NEGATIVE mg/dL
Leukocytes,Ua: NEGATIVE
Nitrite: NEGATIVE
Protein, ur: NEGATIVE mg/dL
Specific Gravity, Urine: 1.005 (ref 1.005–1.030)
pH: 7 (ref 5.0–8.0)

## 2021-04-16 LAB — MAGNESIUM: Magnesium: 1.9 mg/dL (ref 1.7–2.4)

## 2021-04-16 LAB — BRAIN NATRIURETIC PEPTIDE: B Natriuretic Peptide: 303.8 pg/mL — ABNORMAL HIGH (ref 0.0–100.0)

## 2021-04-16 MED ORDER — METOPROLOL SUCCINATE ER 25 MG PO TB24
25.0000 mg | ORAL_TABLET | Freq: Two times a day (BID) | ORAL | Status: DC
Start: 2021-04-16 — End: 2021-04-22
  Administered 2021-04-16 – 2021-04-22 (×12): 25 mg via ORAL
  Filled 2021-04-16 (×13): qty 1

## 2021-04-16 MED ORDER — POTASSIUM CHLORIDE CRYS ER 20 MEQ PO TBCR
40.0000 meq | EXTENDED_RELEASE_TABLET | Freq: Once | ORAL | Status: AC
  Administered 2021-04-16: 40 meq via ORAL
  Filled 2021-04-16: qty 2

## 2021-04-16 MED ORDER — ASPIRIN EC 81 MG PO TBEC
81.0000 mg | DELAYED_RELEASE_TABLET | Freq: Every day | ORAL | Status: DC
  Administered 2021-04-16 – 2021-04-22 (×7): 81 mg via ORAL
  Filled 2021-04-16 (×7): qty 1

## 2021-04-16 MED ORDER — ALBUTEROL SULFATE (2.5 MG/3ML) 0.083% IN NEBU: 2.5000 mg | INHALATION_SOLUTION | Freq: Four times a day (QID) | RESPIRATORY_TRACT | Status: DC | PRN

## 2021-04-16 NOTE — Assessment & Plan Note (Addendum)
-  Has 30-pack-year history, currently not wheezing  ?-Continue albuterol as needed  ?

## 2021-04-16 NOTE — Plan of Care (Signed)

## 2021-04-16 NOTE — Assessment & Plan Note (Addendum)
-   Patient had reported 1 day of progressive confusion on admission, currently resolved  ?-ABG with no hypercarbia, metabolic panel within normal limits, no UTI ?-CT head with no acute intracranial abnormality, moderate to severe small vessel disease.  Possibly had sundowning, delirium, unclear if she has been developing dementia.  Currently she is fully alert and oriented. ?

## 2021-04-16 NOTE — Progress Notes (Signed)
? ? ? [facility] ?                                                                            ? ? ?Jeanne Lawson, is a 76 y.o. female, DOB - 1945-12-07, NLG:[id number] ?Admit date - 04/15/2021    ?Outpatient Primary MD for the patient is [name], MD ? ?LOS - 1  days ? ? ? ?Brief summary  ? ?Patient is a 76 year old female with history of chronic diastolic CHF, chronic edema bilateral lower extremities, COPD, HTN presented with worsening of her chronic edema.  Patient reported that she noticed worsening of the edema with redness in the last 4 to 5 days, inability to ambulate due to swelling.  In the last 1 to 2 days she noticed symmetrical erythema in the lower extremities but no warmth or drainage or fevers. ?Lower extremity ultrasound showed no evidence of acute DVT.  She was noted to have some confusion in ED, CT head was negative. ?Received Lasix 40 mg IV x1 in ED. ? ? ?Assessment & Plan  ? ? ?Assessment and Plan: ?* Acute on chronic diastolic CHF (congestive heart failure) ([place]) ?-Presented with progressively worsening acute on chronic edema of bilateral LE.  Elevated BNP 303.8 ?-Patient reports good compliance with outpatient Lasix 40 mg daily  ?-Continue IV Lasix 40 mg twice daily, strict I's and O's and daily weights ?-Cardiology consulted, last seen in office by Dr.Nelson in 06/2019 ?-She also appears to have chronic lymphedema, will benefit from wrapping/Unna boots or lymphedema clinic. ?-Obtain 2D echo, last echo in the system from 2017 with EF of 6065%, G1 DD ? ?Acute encephalopathy ?- Patient had reported 1 day of progressive confusion on admission however at the time of my examination, fully alert and oriented  ?-ABG with no hypercarbia, metabolic panel within normal limits, no UTI ?-CT head with no acute intracranial CT findings.  Atrial for with moderate to severe small vessel disease. ? ?COPD (chronic obstructive pulmonary disease) ([place]) ?-Has 30-pack-year history, currently not  wheezing  ?-Continue albuterol as needed  ? ?Hypertension ?- Continue IV Lasix, BP stable ?  ?  ?  ? ? ?Code Status: Full code ?DVT Prophylaxis:  enoxaparin (LOVENOX) injection 40 mg Start: 04/16/21 1000 ? ? ?Level of Care: Level of care: Telemetry Medical ?Family Communication: ?Disposition Plan:     Remains inpatient appropriate:   ? ?Procedures:  ?None ? ?Consultants:   ?Cardiology ? ?Antimicrobials:  ?None ? ?Medications ? ? aspirin EC  81 mg Oral Q lunch  ? enoxaparin (LOVENOX) injection  40 mg Subcutaneous Daily  ? furosemide  40 mg Intravenous BID  ? metoprolol succinate  25 mg Oral BID  ? ? ? ?Subjective:  ? ?Jeanne Lawson was seen and examined today.  Alert and oriented, no shortness of breath, no chest pain.  Still has significant lower extremity edema.  No nausea vomiting abdominal pain or diarrhea. ? ?Objective:  ? ?Vitals:  ? 04/16/21 0811 04/16/21 0915 04/16/21 1045 04/16/21 1200  ?BP:  (!) 141/84 123/67 127/75  ?Pulse:  81 61 77  ?Resp:  '18 20 16  '$ ?Temp:      ?TempSrc:      ?SpO2:  96% 96% 98%  ?Weight: 75.8 kg     ?Height: '5\' 1"'$  (1.549 m)     ? ? ?Intake/Output Summary (Last 24 hours) at 04/16/2021 1259 ?Last data filed at 04/16/2021 0307 ?Gross per 24 hour  ?Intake --  ?Output 1800 ml  ?Net -1800 ml  ? ?Filed Weights  ? 04/16/21 0811  ?Weight: 75.8 kg  ? ? ? ?Exam ?General: Alert and oriented x 3, NAD ?Cardiovascular: S1 S2 auscultated, RRR ?Respiratory: Clear to auscultation bilaterally, no wheezing,  ?Gastrointestinal: Soft, nontender, nondistended, + bowel sounds ?Ext: 2-3+ pitting edema up to the knees. ?Neuro: no new deficits ?Skin: Erythema noted on the bilateral lower extremity ?Psych: Normal affect and demeanor, alert and oriented x3  ? ? ?Data Reviewed:  I have personally reviewed following labs  ? ? ?CBC ?Lab Results  ?Component Value Date  ? WBC 4.5 04/16/2021  ? RBC 3.98 04/16/2021  ? HGB 10.2 (L) 04/16/2021  ? HCT 30.0 (L) 04/16/2021  ? MCV 87.2 04/16/2021  ? MCH 27.6 04/16/2021  ? PLT 109  (L) 04/16/2021  ? MCHC 31.7 04/16/2021  ? RDW 15.6 (H) 04/16/2021  ? LYMPHSABS 0.9 04/16/2021  ? MONOABS 0.5 04/16/2021  ? EOSABS 0.0 04/16/2021  ? BASOSABS 0.0 04/16/2021  ? ? ? ?Last metabolic panel ?Lab Results  ?Component Value Date  ? NA 140 04/16/2021  ? K 3.4 (L) 04/16/2021  ? CL 104 04/16/2021  ? CO2 28 04/16/2021  ? BUN 12 04/16/2021  ? CREATININE 0.91 04/16/2021  ? GLUCOSE 100 (H) 04/16/2021  ? GFRNONAA >60 04/16/2021  ? GFRAA 95 07/22/2019  ? CALCIUM 8.6 (L) 04/16/2021  ? PHOS 2.6 08/24/2017  ? PROT 6.3 (L) 04/16/2021  ? ALBUMIN 3.3 (L) 04/16/2021  ? LABGLOB 2.6 07/22/2019  ? AGRATIO 1.5 07/22/2019  ? BILITOT 1.3 (H) 04/16/2021  ? ALKPHOS 46 04/16/2021  ? AST 21 04/16/2021  ? ALT 14 04/16/2021  ? ANIONGAP 8 04/16/2021  ? ? ?CBG (last 3)  ?Recent Labs  ?  04/15/21 ?2347  ?GLUCAP 116*  ?  ?Radiology Studies: I have personally reviewed the imaging studies  ?CT Head Wo Contrast ? ?Result Date: 04/16/2021 ?IMPRESSION: 1. No acute intracranial CT findings or depressed skull fractures. 2. Atrophy, with moderate to severe small vessel disease, the latter at least mildly progressed from 2021 with atrophy seeming stable. Old lacunar infarcts. 3. 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 Stent patency cannot be established with noncontrast technique. Electronically Signed   By: Telford Nab M.D.   On: 04/16/2021 02:05  ? ?DG Chest Port 1 View ? ?Result Date: 04/16/2021 ?IMPRESSION: No acute abnormality noted. Electronically Signed   By: Inez Catalina M.D.   On: 04/16/2021 00:48  ? ?DG Knee Complete 4 Views Right ? ?Result Date: 04/15/2021 ? IMPRESSION: 1. Mild osteoarthritis. 2. No acute bony abnormality. 3. Diffuse subcutaneous edema. Electronically Signed   By: Randa Ngo M.D.   On: 04/15/2021 21:24  ? ?DG Hip Unilat W or Wo Pelvis 2-3 Views Right ? ?Result Date: 04/15/2021 ?CLINICAL DATA:  Right hip pain   IMPRESSION: 1. No acute displaced fracture. Electronically Signed   By: Randa Ngo M.D.   On: 04/15/2021 21:23  ? ?VAS [place] LOWER EXTREMITY VENOUS (DVT) (7a-7p) ? ?Result Date: 04/15/2021 ?Summary: RIGHT: - There is no evidence of deep vein thrombosis in the lower extremity. However, portions of this examination  were limited- see technologist comments above.  - No cystic structure found in the popliteal fossa.  LEFT: - There is no evidence of deep vein thrombosis in the lower extremity. However, portions of this examination were limited- see technologist comments above.  - No cystic structure found in the popliteal fossa.  *See table(s) above for measurements and observations. Electronically signed by Orlie Pollen on 04/15/2021 at 7:29:39 PM.    Final    ? ? ? ? ?Estill Cotta M.D. ?[facility] ?04/16/2021, 12:59 PM ? ?Available via Epic secure chat 7am-7pm ?After 7 pm, please refer to night coverage provider listed on amion. ? ?  ?

## 2021-04-16 NOTE — Hospital Course (Addendum)
Patient is a 76 year old female with history of chronic diastolic CHF, chronic edema bilateral lower extremities, COPD, HTN presented with worsening of her chronic edema.  Patient reported that she noticed worsening of the edema with redness in the last 4 to 5 days, inability to ambulate due to swelling.  In the last 1 to 2 days she noticed symmetrical erythema in the lower extremities but no warmth or drainage or fevers. ?Lower extremity ultrasound showed no evidence of acute DVT.  She was noted to have some confusion in ED, CT head was negative. ?Received Lasix 40 mg IV x1 in ED. ? ?Cardiology consulted ?

## 2021-04-16 NOTE — Consult Note (Signed)
?Cardiology Consultation:  ? ?Patient ID: Jeanne Lawson ?MRN: [id number]; DOB: 1945-03-17 ? ?Admit date: 04/15/2021 ?Date of Consult: 04/16/2021 ? ?PCP:  [name], MD ?  ?[facility] Providers ?Cardiologist:  Ena Dawley, MD      ? ? ?Patient Profile:  ? ?Jeanne Lawson is a 76 y.o. female with a hx of multiple strokes, hypertension, coronary artery disease with cardiac cath and angioplasty approximately 20 years ago but no stents placed who is being seen 04/16/2021 for the evaluation of diastolic heart failure at the request of Dr. Tana Coast. ? ?History of Present Illness:  ? ?Jeanne Lawson has noticed 4-5 days of worsening LE edema over and above her known chronic lymphedema. The patient denies chest pain, chest pressure, dyspnea at rest or with exertion, palpitations, PND, orthopnea. Denies cough, fever, chills. Denies nausea, vomiting. Denies syncope or presyncope. Denies dizziness or lightheadedness.  ? ?She has chronic diastolic heart failure and has required diuretics in the past. She notes that she has not been taking lasix at home because it was stopped by Dr. [name], however by chart review it appears her last visit with him was several years ago. She has been on lasix more recently than that by chart review. No family at the bedside to assist with history.  ? ?She has received 2 doses of IV lasix and has diuresed almost 2 L thusfar. ? ?Past Medical History:  ?Diagnosis Date  ? Acute on chronic diastolic CHF (congestive heart failure) ([place]) 04/16/2021  ? Cancer [facility])   ? right leg skin   ? COPD (chronic obstructive pulmonary disease) ([place])   ? Coronary artery disease 1996  ? Known with prior mild lesion of LAD demonstrated by Cardiac Catheterization in 1996  ? Edema of foot   ? She has a history of chronic edema of the left dated back to age 11 when she suufered severe frostbite playing  on the snow as a child  ? Hypertension   ? Lower extremity edema   ? PAC (premature atrial contraction)   ?  Pancreatitis   ? x2  ? PONV (postoperative nausea and vomiting)   ? problems waking up last time 4/16 only time  ? Saccular aneurysm   ? She also has 2 known which were stable between the MRA of October2010 and the MRA  of April 2011.  ? Stroke ([place]) 04/2014  ? 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 TIA (transient ischemic attack)   ? She was hospitalized 04-23-09 through 04-27-09 for involving right side of the body  ? Tobacco abuse   ? Ongoing   ? Ventricular hypertrophy 04/2009  ? LVH with diastolic dysfunction by echo. Has normal EF.  ? ? ?Past Surgical History:  ?Procedure Laterality Date  ? ABDOMINAL HYSTERECTOMY    ? APPENDECTOMY    ? CARDIAC CATHETERIZATION  1996  ? Mild CAD with vasospasm  ? CARDIOVASCULAR STRESS TEST  12-02-2001  ? EF 70%  ? CHOLECYSTECTOMY N/A 03/09/2016  ? Procedure: LAPAROSCOPIC CHOLECYSTECTOMY WITH INTRAOPERATIVE CHOLANGIOGRAM;  Surgeon: Georganna Skeans, MD;  Location: [place];  Service: General;  Laterality: N/A;  ? ENDARTERECTOMY Right 08/12/2014  ? Procedure: RIGHT  COMMON CAROTID ARTERY EXPOSURE FOR INTERVENTIONAL RADIOLOGY PROCEDURE BY DR.DEVASHWAR,Insertion 6 FR sheath;  Surgeon: Elam Dutch, MD;  Location: [place];  Service: Vascular;  Laterality: Right;  ? ENDARTERECTOMY Right 08/12/2014  ? Procedure:  CAROTID  EXPOSURE CLOSURE RIGHT NECK, REPAIR RIGHT COMMON CAROTID ARTERY;  Surgeon: Elam Dutch, MD;  Location: [place];  Service: Vascular;  Laterality: Right;  ? ENDARTERECTOMY Left 11/18/2014  ? Procedure: CAROTID EXPOSURE;  Surgeon: Elam Dutch, MD;  Location: [place];  Service: Vascular;  Laterality: Left;  ? ENDARTERECTOMY Left 11/18/2014  ? Procedure: CLOSURE CAROTID;  Surgeon: Elam Dutch, MD;  Location: [place];  Service: Vascular;  Laterality: Left;  ? ENDARTERECTOMY Right 08/22/2017  ? Procedure: EXPOSURE CAROTID ARTERY FOR [place] SURGERY;  Surgeon: Elam Dutch, MD;  Location:  [facility];  Service: Vascular;  Laterality: Right;  ? IR ANGIO INTRA EXTRACRAN SEL INTERNAL CAROTID UNI R MOD SED  08/22/2017  ? IR ANGIOGRAM FOLLOW UP STUDY  08/22/2017  ? IR RADIOLOGIST EVAL & MGMT  06/26/2016  ? IR RADIOLOGIST EVAL & MGMT  05/08/2017  ? IR RADIOLOGY PERIPHERAL GUIDED IV START  12/03/2017  ? IR TRANSCATH/EMBOLIZ  08/22/2017  ? IR US GUIDE VASC ACCESS RIGHT  12/03/2017  ? LAPAROSCOPY    ? NECK SURGERY  50 yrs ago  ? Left side tumor  ? RADIOLOGY WITH ANESTHESIA N/A 05/25/2014  ? Procedure: RADIOLOGY WITH ANESTHESIA;  Surgeon: [name], MD;  Location: [place];  Service: Radiology;  Laterality: N/A;  ? RADIOLOGY WITH ANESTHESIA N/A 08/12/2014  ? Procedure: RADIOLOGY WITH ANESTHESIA;  Surgeon: [name], MD;  Location: [place];  Service: Radiology;  Laterality: N/A;  ? RADIOLOGY WITH ANESTHESIA N/A 03/15/2015  ? Procedure: MRI OF BRAIN WITHOUT CONTRAST;  Surgeon: Medication Radiologist, MD;  Location: [place];  Service: Radiology;  Laterality: N/A;  DR. TAT/MRI  ? US ECHOCARDIOGRAPHY  04-26-2009  ? EF 65-70%  ? VESICOVAGINAL FISTULA CLOSURE W/ TAH  25 yrs ago  ? WOUND EXPLORATION Right 08/22/2017  ? Procedure: REMOVAL OF RIGHT COMMON CAROTID SHEATH AND WOUND CLOSURE;  Surgeon: Rosetta Posner, MD;  Location: [place];  Service: Vascular;  Laterality: Right;  ?  ? ?Home Medications:  ?Prior to Admission medications   ?Medication Sig Start Date End Date Taking? Authorizing Provider  ?albuterol (PROVENTIL HFA;VENTOLIN HFA) 108 (90 BASE) MCG/ACT inhaler Inhale 1-2 puffs into the lungs every 6 (six) hours as needed for wheezing or shortness of breath.    [provider]  ?aspirin EC 81 MG tablet Take 81 mg by mouth daily with lunch.     [provider]  ?furosemide (LASIX) 40 MG tablet Take 1 tablet (40 mg total) by mouth daily. 04/28/20   Freada Bergeron, MD  ?HYDROcodone-acetaminophen (NORCO/VICODIN) 5-325 MG tablet Take 1 tablet by mouth every 6 (six) hours as needed for moderate pain. 08/09/20    Newt Minion, MD  ?potassium chloride SA (KLOR-CON) 20 MEQ tablet Take 2 tablets (40 mEq total) by mouth daily. 05/04/20   Freada Bergeron, MD  ?TOPROL XL 50 MG 24 hr tablet TAKE 1/2 TABLET BY MOUTH TWICE DAILY. TAKE WITH OR IMMEDIATELY FOLLOWING A MEAL Please keep upcoming appt with Dr. Johney Frame new Cardiologist in April 2023 before anymore refills. Thank you Final Attempt 03/01/21   Freada Bergeron, MD  ? ? ?Inpatient Medications: ?Scheduled Meds: ? aspirin EC  81 mg Oral Q lunch  ? enoxaparin (LOVENOX) injection  40 mg Subcutaneous Daily  ? furosemide  40 mg Intravenous BID  ? metoprolol succinate  25 mg Oral BID  ? ?Continuous Infusions: ? ?PRN Meds: ?acetaminophen **OR** acetaminophen, albuterol ? ?Allergies:    ?Allergies  ?Allergen Reactions  ?  Dilaudid [Hydromorphone] Swelling  ?  SWELLING REACTION UNSPECIFIED   ? Latex Swelling  ?  gloves used at dental office caused lips to swell. Used different ones no problem. Never had any problem at hospital or anywhere else.  ? Lidocaine Other (See Comments)  ?  "makes my heart race"  ? Sulfa Drugs Cross Reactors Other (See Comments)  ?  UNSPECIFIED REACTION OF CHILDHOOD  ? Codeine Nausea And Vomiting  ? ? ?Social History:   ?Social History  ? ?Socioeconomic History  ? Marital status: Widowed  ?  Spouse name: Not on file  ? Number of children: Not on file  ? Years of education: Not on file  ? Highest education level: Not on file  ?Occupational History  ? Not on file  ?Tobacco Use  ? Smoking status: Former  ?  Packs/day: 1.00  ?  Years: 30.00  ?  Pack years: 30.00  ?  Types: Cigarettes  ?  Quit date: 04/26/2014  ?  Years since quitting: 6.9  ? Smokeless tobacco: Never  ?Vaping Use  ? Vaping Use: Never used  ?Substance and Sexual Activity  ? Alcohol use: No  ? Drug use: No  ? Sexual activity: Not on file  ?Other Topics Concern  ? Not on file  ?Social History Narrative  ? Not on file  ? ?Social Determinants of Health  ? ?Financial Resource Strain: Not on file   ?Food Insecurity: Not on file  ?Transportation Needs: Not on file  ?Physical Activity: Not on file  ?Stress: Not on file  ?Social Connections: Not on file  ?Intimate Partner Violence: Not on file  ?  ?Family H

## 2021-04-16 NOTE — Assessment & Plan Note (Addendum)
-   Continue IV Lasix, BP stable ?  ?  ?  ?

## 2021-04-16 NOTE — H&P (Signed)
?History and Physical  ? ? ?PLEASE NOTE THAT DRAGON DICTATION SOFTWARE WAS USED IN THE CONSTRUCTION OF THIS NOTE. ? ? ?JAYLANNI ELTRINGHAM PIR:[id number] DOB: 30-Jul-1945 DOA: 04/15/2021 ? ?PCP: [name], MD  ?Patient coming from: home  ? ?I have personally briefly reviewed patient's old medical records in [place] ? ?Chief Complaint: Worsening of edema in bilateral lower extremities ? ?HPI: Jeanne Lawson is a 76 y.o. female with medical history significant for chronic diastolic heart failure, chronic edema bilateral lower extremities, COPD, who is admitted to [facility] on 04/15/2021 with acute on chronic diastolic heart failure after presenting from home to [facility] ED complaining of worsening of edema bilateral lower extremities.  ? ?Following history provided by the patient as well as her daughter in addition to my discussions with the EDP and via chart review. ? ?In the setting of a history of chronic edema in the bilateral lower extremities, the patient has been experiencing worsening of this edema over the last 4 to 5 days, rendering her ability to ambulate and perform her ADLs more difficult.  Over the last 1 to 2 days, she has noted some symmetrical erythema in the bilateral lower extremities distal to the bilateral knees, but denies any associated increased warmth or purulent drainage.  Denies any preceding trauma to the lower extremities.  She also denies any associated shortness of breath, orthopnea, or PND.  Not associate with any new onset calf tenderness.  Not associate with the chest pain, palpitations, diaphoresis, nausea, vomiting, dizziness, presyncope, syncope.  No wheezing or hemoptysis.  Denies any subjective fever, chills, rigors, or generalized myalgias.  No recent cough.  Not associate with any acute focal weakness or any acute focal numbness/paresthesias.  No slurred speech, facial droop, acute change in vision.  ? ?Daughter also conveys her concern that the patient has exhibited  evidence of progressive confusion relative to baseline mental status over the course of the last day.  At baseline, she is alert and oriented x4.  No known history of underlying dementia. ? ?Medical history notable for chronic diastolic heart failure, with most recent echocardiogram in February 2017 notable for LVEF 60 to 65%, no focal motion normalities, moderate LVH, grade 1 diastolic dysfunction, and normal right ventricular systolic function.  Her outpatient diuretic regimen consists of Lasix 40 mg p.o. daily, with associated reported good compliance.  No recent changes to his outpatient diuretic regimen. ? ?She also has a history of COPD in the setting of being a former smoker.  No baseline supplemental oxygen requirements. ? ? ? ?ED Course:  ?Vital signs in the ED were notable for the following: Afebrile; heart rate 78-89; blood pressure 134/65; respiratory rate 16-24, oxygen saturation 97 to 100% on room air. ? ?Labs were notable for the following: CMP notable for the following: Sodium 137, potassium 3.5, bicarbonate 24, creatinine 0.75, calcium, adjusted for mild hypoalbuminemia noted to be 9.0, albumin three-point, otherwise liver enzymes within normal limits.  Ammonia 26.  CPK 2 3.  CBC notable for episode count 3900 with 59% neutrophils.  Urinalysis ordered, with result currently pending. ? ?Imaging and additional notable ED work-up: EKG shows sinus rhythm with heart rate 79, normal intervals, no evidence of T wave or ST changes, including no evidence of ST elevation.  Venous ultrasound of the bilateral lower extremities shows no evidence of acute DVT.  Additionally, in the setting of concern for 1 day of progressive confusion, CT head has been ordered, with result currently pending. ? ?  While in the ED, the following were administered: Lasix 40 mg IV x1 dose. ? ?Subsequently, the patient was admitted for further evaluation management of acute on chronic diastolic heart failure, with additional concern for  acute encephalopathy. ? ? ? ?Review of Systems: As per HPI otherwise 10 point review of systems negative.  ? ?Past Medical History:  ?Diagnosis Date  ? 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 COPD (chronic obstructive pulmonary disease) ([place])   ? Coronary artery disease 1996  ? Known with prior mild lesion of LAD demonstrated by Cardiac Catheterization in 1996  ? Edema of foot   ? She has a history of chronic edema of the left dated back to age 76 when she suufered severe frostbite playing  on the snow as a child  ? Hypertension   ? Lower extremity edema   ? PAC (premature atrial contraction)   ? Pancreatitis   ? x2  ? PONV (postoperative nausea and vomiting)   ? problems waking up last time 4/16 only time  ? Saccular aneurysm   ? She also has 2 known which were stable between the MRA of October2010 and the MRA  of April 2011.  ? Stroke ([place]) 04/2014  ? 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 TIA (transient ischemic attack)   ? She was hospitalized 04-23-09 through 04-27-09 for involving right side of the body  ? Tobacco abuse   ? Ongoing   ? Ventricular hypertrophy 04/2009  ? LVH with diastolic dysfunction by echo. Has normal EF.  ? ? ?Past Surgical History:  ?Procedure Laterality Date  ? ABDOMINAL HYSTERECTOMY    ? APPENDECTOMY    ? CARDIAC CATHETERIZATION  1996  ? Mild CAD with vasospasm  ? CARDIOVASCULAR STRESS TEST  12-02-2001  ? EF 70%  ? CHOLECYSTECTOMY N/A 03/09/2016  ? Procedure: LAPAROSCOPIC CHOLECYSTECTOMY WITH INTRAOPERATIVE CHOLANGIOGRAM;  Surgeon: Georganna Skeans, MD;  Location: [place];  Service: General;  Laterality: N/A;  ? ENDARTERECTOMY Right 08/12/2014  ? Procedure: RIGHT  COMMON CAROTID ARTERY EXPOSURE FOR INTERVENTIONAL RADIOLOGY PROCEDURE BY DR.DEVASHWAR,Insertion 6 FR sheath;  Surgeon: Elam Dutch, MD;  Location: [place];  Service: Vascular;  Laterality: Right;  ? ENDARTERECTOMY Right 08/12/2014  ? Procedure:  CAROTID   EXPOSURE CLOSURE RIGHT NECK, REPAIR RIGHT COMMON CAROTID ARTERY;  Surgeon: Elam Dutch, MD;  Location: [place];  Service: Vascular;  Laterality: Right;  ? ENDARTERECTOMY Left 11/18/2014  ? Procedure: CAROTID EXPOSURE;  Surgeon: Elam Dutch, MD;  Location: [place];  Service: Vascular;  Laterality: Left;  ? ENDARTERECTOMY Left 11/18/2014  ? Procedure: CLOSURE CAROTID;  Surgeon: Elam Dutch, MD;  Location: [place];  Service: Vascular;  Laterality: Left;  ? ENDARTERECTOMY Right 08/22/2017  ? Procedure: EXPOSURE CAROTID ARTERY FOR [place] SURGERY;  Surgeon: Elam Dutch, MD;  Location: [facility] OR;  Service: Vascular;  Laterality: Right;  ? IR ANGIO INTRA EXTRACRAN SEL INTERNAL CAROTID UNI R MOD SED  08/22/2017  ? IR ANGIOGRAM FOLLOW UP STUDY  08/22/2017  ? IR RADIOLOGIST EVAL & MGMT  06/26/2016  ? IR RADIOLOGIST EVAL & MGMT  05/08/2017  ? IR RADIOLOGY PERIPHERAL GUIDED IV START  12/03/2017  ? IR TRANSCATH/EMBOLIZ  08/22/2017  ? IR US GUIDE VASC ACCESS RIGHT  12/03/2017  ? LAPAROSCOPY    ? NECK SURGERY  50 yrs ago  ? Left side tumor  ?  RADIOLOGY WITH ANESTHESIA N/A 05/25/2014  ? Procedure: RADIOLOGY WITH ANESTHESIA;  Surgeon: [name], MD;  Location: [place];  Service: Radiology;  Laterality: N/A;  ? RADIOLOGY WITH ANESTHESIA N/A 08/12/2014  ? Procedure: RADIOLOGY WITH ANESTHESIA;  Surgeon: [name], MD;  Location: [place];  Service: Radiology;  Laterality: N/A;  ? RADIOLOGY WITH ANESTHESIA N/A 03/15/2015  ? Procedure: MRI OF BRAIN WITHOUT CONTRAST;  Surgeon: Medication Radiologist, MD;  Location: [place];  Service: Radiology;  Laterality: N/A;  DR. TAT/MRI  ? US ECHOCARDIOGRAPHY  04-26-2009  ? EF 65-70%  ? VESICOVAGINAL FISTULA CLOSURE W/ TAH  25 yrs ago  ? WOUND EXPLORATION Right 08/22/2017  ? Procedure: REMOVAL OF RIGHT COMMON CAROTID SHEATH AND WOUND CLOSURE;  Surgeon: Rosetta Posner, MD;  Location: [place];  Service: Vascular;  Laterality: Right;  ? ? ?Social History: ? reports that she quit smoking about 6  years ago. Her smoking use included cigarettes. She has a 30.00 pack-year smoking history. She has never used smokeless tobacco. She reports that she does not drink alcohol and does not use drugs. ? ? ?All

## 2021-04-16 NOTE — ED Notes (Signed)
Pt transferred from bed to recliner.  

## 2021-04-16 NOTE — Assessment & Plan Note (Addendum)
-  Presented with progressively worsening acute on chronic edema of bilateral LE.  Elevated BNP 303.8 ?-Patient reports good compliance with outpatient Lasix 40 mg daily  ?-Continue IV Lasix 40 mg twice daily, negative balance of 2.4 L  ?-Cardiology consulted, appreciate recommendation ?-She also appears to have chronic lymphedema, will benefit from wrapping/Unna boots, TED hoses or lymphedema clinic. ?-Follow 2D echo ?-Doppler ultrasound lower extremities negative for DVT ?

## 2021-04-17 ENCOUNTER — Inpatient Hospital Stay (HOSPITAL_COMMUNITY): Payer: Medicare Other

## 2021-04-17 DIAGNOSIS — I5031 Acute diastolic (congestive) heart failure: Secondary | ICD-10-CM

## 2021-04-17 DIAGNOSIS — J410 Simple chronic bronchitis: Secondary | ICD-10-CM | POA: Diagnosis not present

## 2021-04-17 DIAGNOSIS — G9340 Encephalopathy, unspecified: Secondary | ICD-10-CM | POA: Diagnosis not present

## 2021-04-17 DIAGNOSIS — R4182 Altered mental status, unspecified: Secondary | ICD-10-CM | POA: Diagnosis not present

## 2021-04-17 DIAGNOSIS — I5033 Acute on chronic diastolic (congestive) heart failure: Secondary | ICD-10-CM | POA: Diagnosis not present

## 2021-04-17 LAB — BASIC METABOLIC PANEL
Anion gap: 9 (ref 5–15)
BUN: 13 mg/dL (ref 8–23)
CO2: 29 mmol/L (ref 22–32)
Calcium: 8.1 mg/dL — ABNORMAL LOW (ref 8.9–10.3)
Chloride: 101 mmol/L (ref 98–111)
Creatinine, Ser: 0.79 mg/dL (ref 0.44–1.00)
GFR, Estimated: >60 mL/min (ref >60)
Glucose, Bld: 81 mg/dL (ref 70–99)
Potassium: 3.3 mmol/L — ABNORMAL LOW (ref 3.5–5.1)
Sodium: 139 mmol/L (ref 135–145)

## 2021-04-17 LAB — ECHOCARDIOGRAM COMPLETE
Area-P 1/2: 2.87 cm2
Height: 61 in
S' Lateral: 3.2 cm
Weight: 2754.87 oz

## 2021-04-17 MED ORDER — ONDANSETRON HCL 4 MG/2ML IJ SOLN: 4.0000 mg | Freq: Four times a day (QID) | INTRAMUSCULAR | Status: DC | PRN

## 2021-04-17 NOTE — Evaluation (Signed)
Physical Therapy Evaluation ?Patient Details ?Name: Jeanne Lawson ?MRN: [id number] ?DOB: Nov 12, 1945 ?Today's Date: 04/17/2021 ? ?History of Present Illness ? Pt is a 76 y.o. F who presents 04/15/2021 with worsening of her chronic edema. Admitted with acute on chronic diastolic CHF. Significant PMH: chronic diastolic CHF, chronic edema bilateral lower extremities, COPD, HTN.  ?Clinical Impression ? PTA, pt lives at [facility], is ambulatory using a RW, and requires assist for some IADL's. Pt presents with decreased functional mobility secondary to BLE edema and decreased activity tolerance. Pt received sitting on edge of bed; pleasant and motivated to participate in therapy session. Pt ambulating ~100 ft with a walker at a min guard assist level, utilizing a step to pattern. SpO2 95% on RA, HR 71. Suspect steady progress.  ?   ? ?Recommendations for follow up therapy are one component of a multi-disciplinary discharge planning process, led by the attending physician.  Recommendations may be updated based on patient status, additional functional criteria and insurance authorization. ? ?Follow Up Recommendations Outpatient PT (pt preference for [facility] on [address]) ? ?  ?Assistance Recommended at Discharge PRN  ?Patient can return home with the following ? Assist for transportation ? ?  ?Equipment Recommendations None recommended by PT  ?Recommendations for Other Services ?   OT consult ?  ?Functional Status Assessment Patient has had a recent decline in their functional status and demonstrates the ability to make significant improvements in function in a reasonable and predictable amount of time.  ? ?  ?Precautions / Restrictions Precautions ?Precautions: Fall ?Restrictions ?Weight Bearing Restrictions: No  ? ?  ? ?Mobility ? Bed Mobility ?  ?  ?  ?  ?  ?  ?  ?General bed mobility comments: Sitting EOB upon arrival ?  ? ?Transfers ?Overall transfer level: Needs assistance ?Equipment used: Rolling walker (2  wheels) ?Transfers: Sit to/from Stand ?Sit to Stand: Min assist, Supervision ?  ?  ?  ?  ?  ?General transfer comment: MinA to rise from edge of bed. Progressed to standing without physical assist from toilet height, pulling up with grab bar ?  ? ?Ambulation/Gait ?Ambulation/Gait assistance: Min guard ?Gait Distance (Feet): 100 Feet ?Assistive device: Rolling walker (2 wheels) ?Gait Pattern/deviations: Step-to pattern, Decreased stride length ?Gait velocity: decreased ?Gait velocity interpretation: <1.8 ft/sec, indicate of risk for recurrent falls ?  ?General Gait Details: Shortened step to pattern, min guard for safety ? ?Stairs ?  ?  ?  ?  ?  ? ?Wheelchair Mobility ?  ? ?Modified Rankin (Stroke Patients Only) ?  ? ?  ? ?Balance Overall balance assessment: Needs assistance ?Sitting-balance support: Feet supported ?Sitting balance-Leahy Scale: Good ?  ?  ?Standing balance support: Single extremity supported, During functional activity ?Standing balance-Leahy Scale: Poor ?Standing balance comment: reliant on at least single UE support when washing hands at sink ?  ?  ?  ?  ?  ?  ?  ?  ?  ?  ?  ?   ? ? ? ?Pertinent Vitals/Pain Pain Assessment ?Pain Assessment: Faces ?Faces Pain Scale: Hurts a little bit ?Pain Location: BLE's ?Pain Descriptors / Indicators: Discomfort ?Pain Intervention(s): Monitored during session  ? ? ?Home Living Family/patient expects to be discharged to:: Private residence ?Living Arrangements: Alone ?  ?Type of Home: Apartment ?Home Access: Elevator ?  ?  ?  ?Home Layout: One level ?Home Equipment: Wheelchair - Publishing copy (2 wheels);Cane - single point ?Additional Comments: [employer] apartments  ?  ?  Prior Function Prior Level of Function : Independent/Modified Independent ?  ?  ?  ?  ?  ?  ?Mobility Comments: Recently discharged from [facility] neuro. Occasionally for community distances pt family will take her in w/c ?ADLs Comments: Independent cooking, cleaning; gets hair washed at  [employer] at [employer]. [place] provides transportation to appointments ?  ? ? ?Hand Dominance  ?   ? ?  ?Extremity/Trunk Assessment  ? Upper Extremity Assessment ?Upper Extremity Assessment: Defer to OT evaluation ?  ? ?Lower Extremity Assessment ?Lower Extremity Assessment: RLE deficits/detail;LLE deficits/detail ?RLE Deficits / Details: Increased edema ?LLE Deficits / Details: Increased edema ?  ? ?   ?Communication  ? Communication: No difficulties  ?Cognition Arousal/Alertness: Awake/alert ?Behavior During Therapy: [facility] for tasks assessed/performed ?Overall Cognitive Status: Impaired/Different from baseline ?Area of Impairment: Problem solving ?  ?  ?  ?  ?  ?  ?  ?  ?  ?  ?  ?  ?  ?  ?Problem Solving: Slow processing, Difficulty sequencing, Requires verbal cues ?General Comments: can be tangential ?  ?  ? ?  ?General Comments   ? ?  ?Exercises    ? ?Assessment/Plan  ?  ?PT Assessment Patient needs continued PT services  ?PT Problem List Decreased strength;Decreased activity tolerance;Decreased balance;Decreased mobility;Obesity ? ?   ?  ?PT Treatment Interventions DME instruction;Gait training;Functional mobility training;Therapeutic activities;Therapeutic exercise;Balance training;Patient/family education   ? ?PT Goals (Current goals can be found in the Care Plan section)  ?Acute Rehab PT Goals ?Patient Stated Goal: walk more ?PT Goal Formulation: With patient ?Time For Goal Achievement: 05/01/21 ?Potential to Achieve Goals: Good ? ?  ?Frequency Min 3X/week ?  ? ? ?Co-evaluation   ?  ?  ?  ?  ? ? ?  ?AM-PAC PT "6 Clicks" Mobility  ?Outcome Measure Help needed turning from your back to your side while in a flat bed without using bedrails?: A Little ?Help needed moving from lying on your back to sitting on the side of a flat bed without using bedrails?: A Little ?Help needed moving to and from a bed to a chair (including a wheelchair)?: A Little ?Help needed standing up from a chair using your arms (e.g.,  wheelchair or bedside chair)?: A Little ?Help needed to walk in hospital room?: A Little ?Help needed climbing 3-5 steps with a railing? : A Lot ?6 Click Score: 17 ? ?  ?End of Session Equipment Utilized During Treatment: Gait belt ?Activity Tolerance: Patient tolerated treatment well ?Patient left: in chair;with call bell/phone within reach ?Nurse Communication: Mobility status ?PT Visit Diagnosis: Unsteadiness on feet (R26.81);Other abnormalities of gait and mobility (R26.89);Difficulty in walking, not elsewhere classified (R26.2) ?  ? ?Time: 1829-9371 ?PT Time Calculation (min) (ACUTE ONLY): 50 min ? ? ?Charges:   PT Evaluation ?$PT Eval Moderate Complexity: 1 Mod ?PT Treatments ?$Therapeutic Activity: 23-37 mins ?  ?   ? ? ?Wyona Almas, PT, DPT ?Acute Rehabilitation Services ?Pager [phone] ?Office [phone] ? ? ?Deno Etienne ?04/17/2021, 11:05 AM ? ?

## 2021-04-17 NOTE — Progress Notes (Signed)
?  Echocardiogram ?2D Echocardiogram has been performed. ? ?Jeanne Lawson ?04/17/2021, 2:34 PM ?

## 2021-04-17 NOTE — Progress Notes (Signed)
? ? ? [facility] ?                                                                            ? ? ?Jeanne Lawson, is a 76 y.o. female, DOB - 05/14/1945, ULA:[id number] ?Admit date - 04/15/2021    ?Outpatient Primary MD for the patient is [name], MD ? ?LOS - 2  days ? ? ? ?Brief summary  ? ?Patient is a 76 year old female with history of chronic diastolic CHF, chronic edema bilateral lower extremities, COPD, HTN presented with worsening of her chronic edema.  Patient reported that she noticed worsening of the edema with redness in the last 4 to 5 days, inability to ambulate due to swelling.  In the last 1 to 2 days she noticed symmetrical erythema in the lower extremities but no warmth or drainage or fevers. ?Lower extremity ultrasound showed no evidence of acute DVT.  She was noted to have some confusion in ED, CT head was negative. ?Received Lasix 40 mg IV x1 in ED. ? ?Cardiology consulted ? ? ?Assessment & Plan  ? ? ?Assessment and Plan: ?* Acute on chronic diastolic CHF (congestive heart failure) ([place]) ?-Presented with progressively worsening acute on chronic edema of bilateral LE.  Elevated BNP 303.8 ?-Patient reports good compliance with outpatient Lasix 40 mg daily  ?-Continue IV Lasix 40 mg twice daily, negative balance of 2.4 L  ?-Cardiology consulted, appreciate recommendation ?-She also appears to have chronic lymphedema, will benefit from wrapping/Unna boots, TED hoses or lymphedema clinic. ?-Follow 2D echo ?-Doppler ultrasound lower extremities negative for DVT ? ?Acute encephalopathy ?- Patient had reported 1 day of progressive confusion on admission, currently resolved  ?-ABG with no hypercarbia, metabolic panel within normal limits, no UTI ?-CT head with no acute intracranial abnormality, moderate to severe small vessel disease.  Possibly had sundowning, delirium, unclear if she has been developing dementia.  Currently she is fully alert and oriented. ? ?COPD (chronic obstructive  pulmonary disease) ([place]) ?-Has 30-pack-year history, currently not wheezing  ?-Continue albuterol as needed  ? ?Hypertension ?- Continue IV Lasix, BP stable ?  ?  ?  ? ?Code Status: Full code ?Family communication: Updated patient's daughter, Ms. Jeanne Lawson ?DVT Prophylaxis:  enoxaparin (LOVENOX) injection 40 mg Start: 04/16/21 1000 ? ? ?Level of Care: Level of care: Telemetry Medical ?Disposition Plan:     Remains inpatient appropriate: Currently on IV Lasix for diuresis ? ?Procedures:  ? ? ?Consultants:   ?Cardiology ? ?Antimicrobials: None ? ?Medications ? ? aspirin EC  81 mg Oral Q lunch  ? enoxaparin (LOVENOX) injection  40 mg Subcutaneous Daily  ? furosemide  40 mg Intravenous BID  ? metoprolol succinate  25 mg Oral BID  ? ? ? ?Subjective:  ? ?Jeanne Lawson was seen and examined today.  No acute complaints.  Good output, feels like lower extremity swelling is improving.  Patient denies dizziness, chest pain, shortness of breath, abdominal pain, N/V/D/C.  No fevers ?Objective:  ? ?Vitals:  ? 04/17/21 0400 04/17/21 0448 04/17/21 0732 04/17/21 1051  ?BP: (!) 126/57  (!) 119/50 132/71  ?Pulse: 63  66 (!) 59  ?Resp: (!) '22  18 20  '$ ?Temp: 98.6 ?F (  37 ?C)  98.6 ?F (37 ?C) 98.7 ?F (37.1 ?C)  ?TempSrc: Oral  Oral Oral  ?SpO2: 93%  93% 93%  ?Weight:  78.1 kg    ?Height:      ? ? ?Intake/Output Summary (Last 24 hours) at 04/17/2021 1331 ?Last data filed at 04/17/2021 1100 ?Gross per 24 hour  ?Intake 720 ml  ?Output 1350 ml  ?Net -630 ml  ? ?Filed Weights  ? 04/16/21 0762 04/17/21 0448  ?Weight: 75.8 kg 78.1 kg  ? ? ? ?Exam ?General: Alert and oriented x 3, NAD ?Cardiovascular: S1 S2 auscultated, no murmurs, RRR ?Respiratory: Fairly CTA B ?Gastrointestinal: Soft, nontender, nondistended, + bowel sounds ?Ext: 2-3+ edema to the knees  ?Neuro: no new deficits ?Skin: Erythema on the bilateral lower legs, no warmth or tenderness diminished ?Psych: Normal affect and demeanor, alert and oriented x3  ? ? ?Data Reviewed:  I have  personally reviewed following labs  ? ? ?CBC ?Lab Results  ?Component Value Date  ? WBC 4.5 04/16/2021  ? RBC 3.98 04/16/2021  ? HGB 10.2 (L) 04/16/2021  ? HCT 30.0 (L) 04/16/2021  ? MCV 87.2 04/16/2021  ? MCH 27.6 04/16/2021  ? PLT 109 (L) 04/16/2021  ? MCHC 31.7 04/16/2021  ? RDW 15.6 (H) 04/16/2021  ? LYMPHSABS 0.9 04/16/2021  ? MONOABS 0.5 04/16/2021  ? EOSABS 0.0 04/16/2021  ? BASOSABS 0.0 04/16/2021  ? ? ? ?Last metabolic panel ?Lab Results  ?Component Value Date  ? NA 139 04/17/2021  ? K 3.3 (L) 04/17/2021  ? CL 101 04/17/2021  ? CO2 29 04/17/2021  ? BUN 13 04/17/2021  ? CREATININE 0.79 04/17/2021  ? GLUCOSE 81 04/17/2021  ? GFRNONAA >60 04/17/2021  ? GFRAA 95 07/22/2019  ? CALCIUM 8.1 (L) 04/17/2021  ? PHOS 2.6 08/24/2017  ? PROT 6.3 (L) 04/16/2021  ? ALBUMIN 3.3 (L) 04/16/2021  ? LABGLOB 2.6 07/22/2019  ? AGRATIO 1.5 07/22/2019  ? BILITOT 1.3 (H) 04/16/2021  ? ALKPHOS 46 04/16/2021  ? AST 21 04/16/2021  ? ALT 14 04/16/2021  ? ANIONGAP 9 04/17/2021  ? ? ?CBG (last 3)  ?Recent Labs  ?  04/15/21 ?2347  ?GLUCAP 116*  ?  ? ? ?Coagulation Profile: ?No results for input(s): INR, PROTIME in the last 168 hours. ? ? ?Radiology Studies: I have personally reviewed the imaging studies  ?CT Head Wo Contrast ? ?Result Date: 04/16/2021 ? IMPRESSION: 1. No acute intracranial CT findings or depressed skull fractures. 2. Atrophy, with moderate to severe small vessel disease, the latter at least mildly progressed from 2021 with atrophy seeming stable. Old lacunar infarcts. 3. 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 Stent patency cannot be established with noncontrast technique. Electronically Signed   By: Telford Nab M.D.   On: 04/16/2021 02:05  ? ?DG Chest Port 1 View ? ?Result Date: 04/16/2021 ? IMPRESSION: No acute abnormality noted. Electronically Signed   By: Inez Catalina M.D.   On: 04/16/2021 00:48  ? ?DG Knee Complete  4 Views Right ? ?Result Date: 04/15/2021 ? IMPRESSION: 1. Mild osteoarthritis. 2. No acute bony abnormality. 3. Diffuse subcutaneous edema. Electronically Signed   By: Randa Ngo M.D.   On: 04/15/2021 21:24  ? ?DG Hip Unilat W or Wo Pelvis 2-3 Views Right ? ?Result Date: 04/15/2021 ?CLINICAL DATA:  Right hip pain  IMPRESSION: 1. No acute displaced fracture. Electronically Signed   By: Legrand Como  Owens Shark M.D.   On: 04/15/2021 21:23  ? ?VAS [place] LOWER EXTREMITY VENOUS (DVT) (7a-7p) ? ?Result Date: 04/15/2021 ?  Summary: RIGHT: - There is no evidence of deep vein thrombosis in the lower extremity. However, portions of this examination were limited- see technologist comments above.  - No cystic structure found in the popliteal fossa.  LEFT: - There is no evidence of deep vein thrombosis in the lower extremity. However, portions of this examination were limited- see technologist comments above.  - No cystic structure found in the popliteal fossa.  *See table(s) above for measurements and observations. Electronically signed by Orlie Pollen on 04/15/2021 at 7:29:39 PM.    Final    ? ? ? ? ?Estill Cotta M.D. ?[facility] ?04/17/2021, 1:31 PM ? ?Available via Epic secure chat 7am-7pm ?After 7 pm, please refer to night coverage provider listed on amion. ? ?  ?

## 2021-04-17 NOTE — Progress Notes (Signed)
? ?Progress Note ? ?Patient Name: Jeanne Lawson ?Date of Encounter: 04/17/2021 ? ?[facility] Cardiologist: Freada Bergeron, MD  ? ?Subjective  ? ?Feeling better ? ?Inpatient Medications  ?  ?Scheduled Meds: ? aspirin EC  81 mg Oral Q lunch  ? enoxaparin (LOVENOX) injection  40 mg Subcutaneous Daily  ? furosemide  40 mg Intravenous BID  ? metoprolol succinate  25 mg Oral BID  ? ?Continuous Infusions: ? ?PRN Meds: ?acetaminophen **OR** acetaminophen, albuterol, ondansetron (ZOFRAN) IV  ? ?Vital Signs  ?  ?Vitals:  ? 04/17/21 0400 04/17/21 0448 04/17/21 0732 04/17/21 1051  ?BP: (!) 126/57  (!) 119/50 132/71  ?Pulse: 63  66 (!) 59  ?Resp: (!) '22  18 20  '$ ?Temp: 98.6 ?F (37 ?C)  98.6 ?F (37 ?C) 98.7 ?F (37.1 ?C)  ?TempSrc: Oral  Oral Oral  ?SpO2: 93%  93% 93%  ?Weight:  78.1 kg    ?Height:      ? ? ?Intake/Output Summary (Last 24 hours) at 04/17/2021 1340 ?Last data filed at 04/17/2021 1100 ?Gross per 24 hour  ?Intake 720 ml  ?Output 1350 ml  ?Net -630 ml  ? ? ?  04/17/2021  ?  4:48 AM 04/16/2021  ?  8:11 AM 07/22/2019  ?  1:55 PM  ?Last 3 Weights  ?Weight (lbs) 172 lb 2.9 oz 167 lb 1.7 oz 167 lb  ?Weight (kg) 78.1 kg 75.8 kg 75.751 kg  ?   ? ?Telemetry  ?  ?SR - Personally Reviewed ? ?ECG  ?  ?No new - Personally Reviewed ? ?Physical Exam  ? ?General:  Well nourished, well developed, in no acute distress ?HEENT: normal ?Neck: no JVD ?Vascular: No carotid bruits; Distal pulses 2+ bilaterally ?Cardiac:  normal S1, S2; RRR; no murmur  ?Lungs:  clear to auscultation bilaterally, no wheezing, rhonchi or rales  ?Abd: soft, nontender, no hepatomegaly  ?Ext: bilateral woody skin change in LE with nonpitting edema. ?Musculoskeletal:  No deformities, BUE and BLE strength normal and equal ?Skin: warm and dry  ?Neuro:  CNs 2-12 intact, no focal abnormalities noted ?Psych:  Normal affect  ? ?Labs  ?  ?High Sensitivity Troponin:  No results for input(s): TROPONINIHS in the last 720 hours.   ?Chemistry ?Recent Labs  ?Lab  04/15/21 ?2015 04/16/21 ?0257 04/16/21 ?0300 04/17/21 ?4174  ?NA 137 140 140 139  ?K 3.5 3.5 3.4* 3.3*  ?CL 108 104  --  101  ?CO2 24 28  --  29  ?GLUCOSE 94 100*  --  81  ?BUN 15 12  --  13  ?CREATININE 0.75 0.91  --  0.79  ?CALCIUM 8.2* 8.6*  --  8.1*  ?MG  --  1.9  --   --   ?PROT 5.8* 6.3*  --   --   ?ALBUMIN 3.1* 3.3*  --   --   ?AST 20 21  --   --   ?ALT 12 14  --   --   ?ALKPHOS 43 46  --   --   ?BILITOT 0.9 1.3*  --   --   ?GFRNONAA >60 >60  --  >60  ?ANIONGAP 5 8  --  9  ?  ?Lipids No results for input(s): CHOL, TRIG, HDL, LABVLDL, LDLCALC, CHOLHDL in the last 168 hours.  ?Hematology ?Recent Labs  ?Lab 04/15/21 ?1438 04/16/21 ?0257 04/16/21 ?0300  ?WBC 3.9* 4.5  --   ?RBC 3.88 3.98  --   ?HGB 10.7* 11.0* 10.2*  ?  HCT 35.1* 34.7* 30.0*  ?MCV 90.5 87.2  --   ?MCH 27.6 27.6  --   ?MCHC 30.5 31.7  --   ?RDW 15.6* 15.6*  --   ?PLT 129* 109*  --   ? ?Thyroid No results for input(s): TSH, FREET4 in the last 168 hours.  ?BNP ?Recent Labs  ?Lab 04/16/21 ?0258  ?BNP 303.8*  ?  ?DDimer No results for input(s): DDIMER in the last 168 hours.  ? ?Radiology  ?  ?CT Head Wo Contrast ? ?Result Date: 04/16/2021 ?CLINICAL DATA:  Minor head trauma. EXAM: CT HEAD WITHOUT CONTRAST TECHNIQUE: Contiguous axial images were obtained from the base of the skull through the vertex without intravenous contrast. RADIATION DOSE REDUCTION: This exam was performed according to the departmental dose-optimization program which includes automated exposure control, adjustment of the mA and/or kV according to patient size and/or use of iterative reconstruction technique. COMPARISON:  Head CT 05/19/2019, CTA head 05/19/2019 FINDINGS: Brain: There is moderately advanced cerebral atrophy and atrophic ventriculomegaly with moderate to severe small vessel disease of the cerebral white matter and chronic lacunar infarcts in the bilateral corona radiata and thalami and left gangliocapsular area. Relatively mild cerebellar atrophy. No new asymmetry is  seen worrisome for cortical based infarct, hemorrhage, mass effect or midline shift. Basal cisterns are clear. Vascular: There are no hyperdense central vessels. 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 Left ICA stent extends from the supraclinoid segment through the left M1 segment. Stent patency cannot be established with a noncontrast exam. Skull: The calvarium, skull base and orbits are intact. 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 Sinuses/Orbits: No acute finding. Other: None. IMPRESSION: 1. No acute intracranial CT findings or depressed skull fractures. 2. Atrophy, with moderate to severe small vessel disease, the latter at least mildly progressed from 2021 with atrophy seeming stable. Old lacunar infarcts. 3. 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 Stent patency cannot be established with noncontrast technique. Electronically Signed   By: Telford Nab M.D.   On: 04/16/2021 02:05  ? ?DG Chest Port 1 View ? ?Result Date: 04/16/2021 ?CLINICAL DATA:  Lower leg pain EXAM: PORTABLE CHEST 1 VIEW COMPARISON:  08/23/2017 FINDINGS: Cardiac shadow is mildly prominent but stable. Aortic calcifications are seen. The lungs are well aerated without focal infiltrate or sizable effusion. No acute bony abnormality is noted. IMPRESSION: No acute abnormality noted. Electronically Signed   By: Inez Catalina M.D.   On: 04/16/2021 00:48  ? ?DG Knee Complete 4 Views Right ? ?Result Date: 04/15/2021 ?CLINICAL DATA:  Knee pain EXAM: RIGHT KNEE - COMPLETE 4+ VIEW COMPARISON:  03/09/2020 FINDINGS: Frontal, bilateral oblique, lateral views of the right knee are obtained. The bones are diffusely osteopenic. No fracture, subluxation, or  dislocation. Mild patellar spurring. Joint spaces are well preserved. No joint effusion. Diffuse subcutaneous edema. IMPRESSION: 1. Mild osteoarthritis. 2. No acute bony abnormality. 3. Diffuse subcutaneous edema. Electronically Signed   By: Randa Ngo M.D.   On: 04/15/2021 21:24  ? ?DG Hip Unilat W or Wo Pelvis 2-3 Views Right ? ?Result Date: 04/15/2021 ?CLINICAL DATA:  Right hip pain EXAM: DG HIP (WITH OR WITHOUT PELVIS) 2-3V RIGHT COMPARISON:  None. FINDINGS: Frontal view of the pelvis  as well as frontal and frogleg lateral views of the right hip are obtained. The bones are diffusely osteopenic. No acute displaced fracture, subluxation, or dislocation. Joint spaces are well preserved. Sacroiliac joints are unremarkable. IMPRESSION: 1. No acute displaced fracture. Electronically Signed   By: Randa Ngo M.D.   On: 04/15/2021 21:23  ? ?VAS [place] LOWER EXTREMITY VENOUS (DVT) (7a-7p) ? ?Result Date: 04/15/2021 ? Lower Venous DVT Study Patient Name:  TITILAYO HAGANS  Date of Exam:   04/15/2021 Medical Rec #: [id number]       Accession #:    [id number] Date of Birth: 1945-06-18      Patient Gender: F Patient Age:   76 years Exam Location:  [facility] Procedure:      VAS [place] LOWER EXTREMITY VENOUS (DVT) Referring Phys: GRACE LOEFFLER --------------------------------------------------------------------------------  Indications: Edema.  Limitations: Poor ultrasound/tissue interface, body habitus and depth of vessels. Comparison Study: No prior study Performing Technologist: Maudry Mayhew MHA, RDMS, RVT, RDCS  Examination Guidelines: A complete evaluation includes B-mode imaging, spectral Doppler, color Doppler, and power Doppler as needed of all accessible portions of each vessel. Bilateral testing is considered an integral part of a complete examination. Limited examinations for reoccurring indications may be performed as noted. The reflux portion of the exam is performed with the patient in reverse  Trendelenburg.  +---------+---------------+---------+-----------+----------+--------------+ RIGHT    CompressibilityPhasicitySpontaneityPropertiesThrombus Aging +---------+---------------+---------+-----------+----------+------

## 2021-04-18 DIAGNOSIS — I10 Essential (primary) hypertension: Secondary | ICD-10-CM | POA: Diagnosis not present

## 2021-04-18 DIAGNOSIS — I890 Lymphedema, not elsewhere classified: Secondary | ICD-10-CM | POA: Diagnosis not present

## 2021-04-18 DIAGNOSIS — I5033 Acute on chronic diastolic (congestive) heart failure: Secondary | ICD-10-CM | POA: Diagnosis not present

## 2021-04-18 LAB — BASIC METABOLIC PANEL
Anion gap: 5 (ref 5–15)
BUN: 12 mg/dL (ref 8–23)
CO2: 33 mmol/L — ABNORMAL HIGH (ref 22–32)
Calcium: 8.2 mg/dL — ABNORMAL LOW (ref 8.9–10.3)
Chloride: 98 mmol/L (ref 98–111)
Creatinine, Ser: 0.94 mg/dL (ref 0.44–1.00)
GFR, Estimated: >60 mL/min (ref >60)
Glucose, Bld: 104 mg/dL — ABNORMAL HIGH (ref 70–99)
Potassium: 2.7 mmol/L — CL (ref 3.5–5.1)
Sodium: 136 mmol/L (ref 135–145)

## 2021-04-18 LAB — CBC
HCT: 31.3 % — ABNORMAL LOW (ref 36.0–46.0)
Hemoglobin: 10.2 g/dL — ABNORMAL LOW (ref 12.0–15.0)
MCH: 27.7 pg (ref 26.0–34.0)
MCHC: 32.6 g/dL (ref 30.0–36.0)
MCV: 85.1 fL (ref 80.0–100.0)
Platelets: 119 10*3/uL — ABNORMAL LOW (ref 150–400)
RBC: 3.68 MIL/uL — ABNORMAL LOW (ref 3.87–5.11)
RDW: 15.3 % (ref 11.5–15.5)
WBC: 4.1 10*3/uL (ref 4.0–10.5)
nRBC: 0 % (ref 0.0–0.2)

## 2021-04-18 LAB — MAGNESIUM: Magnesium: 1.8 mg/dL (ref 1.7–2.4)

## 2021-04-18 LAB — TSH: TSH: 2.357 u[IU]/mL (ref 0.350–4.500)

## 2021-04-18 LAB — POTASSIUM: Potassium: 3.9 mmol/L (ref 3.5–5.1)

## 2021-04-18 MED ORDER — POTASSIUM CHLORIDE CRYS ER 20 MEQ PO TBCR
40.0000 meq | EXTENDED_RELEASE_TABLET | Freq: Two times a day (BID) | ORAL | Status: AC
  Administered 2021-04-18 (×2): 40 meq via ORAL
  Filled 2021-04-18 (×2): qty 2

## 2021-04-18 MED ORDER — POTASSIUM CHLORIDE 10 MEQ/100ML IV SOLN
10.0000 meq | INTRAVENOUS | Status: AC
  Administered 2021-04-18: 10 meq via INTRAVENOUS
  Filled 2021-04-18 (×2): qty 100

## 2021-04-18 NOTE — Care Management Important Message (Signed)
Important Message ? ?Patient Details  ?Name: Jeanne Lawson ?MRN: [id number] ?Date of Birth: 1945-04-27 ? ? ?Medicare Important Message Given:  Yes ? ? ? ? ?[name] ?04/18/2021, 2:03 PM ?

## 2021-04-18 NOTE — Progress Notes (Signed)
Critical value- K+ 2.7 ?Paged [facility] on call ?See orders ?

## 2021-04-18 NOTE — Evaluation (Signed)
Occupational Therapy Evaluation ?Patient Details ?Name: Jeanne Lawson ?MRN: [id number] ?DOB: [birth year] ?Today's Date: 04/18/2021 ? ? ?History of Present Illness 76 y.o. F who presents 04/15/2021 with worsening of her chronic edema. Admitted with acute on chronic diastolic CHF. Significant PMH: chronic diastolic CHF, chronic edema bilateral lower extremities, COPD, HTN.  ? ?Clinical Impression ?  ?PTA, pt was living at [facility] and was independent with ADLs; staff assisting with certain IADLs including transportation. Pt presenting with decreased balance and strength compared to baseline. Pt would benefit from further acute OT to facilitate safe dc. Recommend dc to home once medically stable per physician.  ?   ? ?Recommendations for follow up therapy are one component of a multi-disciplinary discharge planning process, led by the attending physician.  Recommendations may be updated based on patient status, additional functional criteria and insurance authorization.  ? ?Follow Up Recommendations ? No OT follow up  ?  ?Assistance Recommended at Discharge Intermittent Supervision/Assistance  ?Patient can return home with the following   ? ?  ?Functional Status Assessment ? Patient has had a recent decline in their functional status and demonstrates the ability to make significant improvements in function in a reasonable and predictable amount of time.  ?Equipment Recommendations ? None recommended by OT  ?  ?Recommendations for Other Services PT consult ? ? ?  ?Precautions / Restrictions Precautions ?Precautions: Fall ?Restrictions ?Weight Bearing Restrictions: No  ? ?  ? ?Mobility Bed Mobility ?  ?  ?  ?  ?  ?  ?  ?General bed mobility comments: Sitting EOB upon arrival ?  ? ?Transfers ?Overall transfer level: Needs assistance ?Equipment used: Rolling walker (2 wheels) ?Transfers: Sit to/from Stand ?Sit to Stand: Min assist, Supervision ?  ?  ?  ?  ?  ?General transfer comment: MinA to rise from edge of bed.  Progressed to standing without physical assist from toilet height, pulling up with grab bar ?  ? ?  ?Balance Overall balance assessment: Needs assistance ?Sitting-balance support: Feet supported ?Sitting balance-Leahy Scale: Good ?  ?  ?Standing balance support: Single extremity supported, During functional activity ?Standing balance-Leahy Scale: Poor ?Standing balance comment: reliant on at least single UE support when washing hands at sink ?  ?  ?  ?  ?  ?  ?  ?  ?  ?  ?  ?   ? ?ADL either performed or assessed with clinical judgement  ? ?ADL Overall ADL's : Needs assistance/impaired ?Eating/Feeding: Sitting;Set up ?  ?Grooming: Oral care;Wash/dry face;Wash/dry hands;Supervision/safety;Set up;Standing ?  ?Upper Body Bathing: Set up;Supervision/ safety;Sitting ?  ?Lower Body Bathing: Minimal assistance;Sit to/from stand ?  ?Upper Body Dressing : Set up;Supervision/safety;Sitting ?  ?Lower Body Dressing: Moderate assistance;Sit to/from stand ?Lower Body Dressing Details (indicate cue type and reason): Max A to don socks ?Toilet Transfer: Minimal assistance;Ambulation;Min guard;Regular Toilet;Rolling walker (2 wheels) ?Toilet Transfer Details (indicate cue type and reason): Min A for safe descent and Min guard A for sit>stand ?Toileting- Clothing Manipulation and Hygiene: Min guard;Sitting/lateral lean ?  ?  ?  ?Functional mobility during ADLs: Min guard;Rolling walker (2 wheels) ?General ADL Comments: Pt presenting with decreased strength and balance  ? ? ? ?Vision   ?   ?   ?Perception   ?  ?Praxis   ?  ? ?Pertinent Vitals/Pain Pain Assessment ?Pain Assessment: Faces ?Faces Pain Scale: Hurts a little bit ?Pain Location: BLE's ?Pain Descriptors / Indicators: Discomfort ?Pain Intervention(s): Monitored during session, Repositioned  ? ? ? ?  Hand Dominance Right ?  ?Extremity/Trunk Assessment Upper Extremity Assessment ?Upper Extremity Assessment: Overall WFL for tasks assessed ?  ?Lower Extremity Assessment ?Lower  Extremity Assessment: Defer to PT evaluation ?RLE Deficits / Details: Increased edema ?LLE Deficits / Details: Increased edema ?  ?Cervical / Trunk Assessment ?Cervical / Trunk Assessment: Kyphotic ?  ?Communication Communication ?Communication: No difficulties ?  ?Cognition Arousal/Alertness: Awake/alert ?Behavior During Therapy: [facility] for tasks assessed/performed ?Overall Cognitive Status: Impaired/Different from baseline ?Area of Impairment: Problem solving ?  ?  ?  ?  ?  ?  ?  ?  ?  ?  ?  ?  ?  ?  ?Problem Solving: Slow processing, Difficulty sequencing, Requires verbal cues ?General Comments: can be tangential ?  ?  ?General Comments  HR 80s ? ?  ?Exercises   ?  ?Shoulder Instructions    ? ? ?Home Living Family/patient expects to be discharged to:: Private residence ?Living Arrangements: Alone ?  ?Type of Home: Apartment ?Home Access: Elevator ?  ?  ?Home Layout: One level ?  ?  ?Bathroom Shower/Tub: Walk-in shower ?  ?Bathroom Toilet: Handicapped height ?  ?  ?Home Equipment: Wheelchair - Publishing copy (2 wheels);Cane - single point;Shower seat;Toilet riser ?  ?Additional Comments: [employer] apartments ?  ? ?  ?Prior Functioning/Environment Prior Level of Function : Independent/Modified Independent ?  ?  ?  ?  ?  ?  ?Mobility Comments: Recently discharged from [facility]. Occasionally for community distances pt family will take her in w/c ?ADLs Comments: Independent cooking, cleaning; gets hair washed at [employer] at [employer]. [place] provides transportation to appointments ?  ? ?  ?  ?OT Problem List: Decreased strength;Decreased range of motion;Decreased activity tolerance;Impaired balance (sitting and/or standing);Decreased knowledge of use of DME or AE;Decreased knowledge of precautions;Decreased cognition ?  ?   ?OT Treatment/Interventions: Therapeutic exercise;Self-care/ADL training;Energy conservation;DME and/or AE instruction  ?  ?OT Goals(Current goals can be found in the care plan  section) Acute Rehab OT Goals ?Patient Stated Goal: Return home ?OT Goal Formulation: With patient ?Time For Goal Achievement: 05/02/21 ?Potential to Achieve Goals: Good ?ADL Goals ?Pt Will Perform Grooming: with modified independence;standing ?Pt Will Perform Upper Body Dressing: with modified independence;sitting;standing ?Pt Will Perform Lower Body Dressing: with modified independence;sit to/from stand;with adaptive equipment;with caregiver independent in assisting ?Pt Will Transfer to Toilet: with modified independence;ambulating;regular height toilet ?Pt Will Perform Toileting - Clothing Manipulation and hygiene: with modified independence;sit to/from stand;sitting/lateral leans  ?OT Frequency: Min 2X/week ?  ? ?Co-evaluation   ?  ?  ?  ?  ? ?  ?AM-PAC OT "6 Clicks" Daily Activity     ?Outcome Measure Help from another person eating meals?: None ?Help from another person taking care of personal grooming?: A Little ?Help from another person toileting, which includes using toliet, bedpan, or urinal?: A Little ?Help from another person bathing (including washing, rinsing, drying)?: A Little ?Help from another person to put on and taking off regular upper body clothing?: A Little ?Help from another person to put on and taking off regular lower body clothing?: A Lot ?6 Click Score: 18 ?  ?End of Session Equipment Utilized During Treatment: Rolling walker (2 wheels) ?Nurse Communication: Mobility status ? ?Activity Tolerance: Patient tolerated treatment well ?Patient left: in chair;with call bell/phone within reach;with chair alarm set ? ?OT Visit Diagnosis: Unsteadiness on feet (R26.81);Other abnormalities of gait and mobility (R26.89);Muscle weakness (generalized) (M62.81)  ?              ?  Time: 2072-1828 ?OT Time Calculation (min): 36 min ?Charges:  OT General Charges ?$OT Visit: 1 Visit ?OT Evaluation ?$OT Eval Moderate Complexity: 1 Mod ?OT Treatments ?$Self Care/Home Management : 8-22 mins ? ?Britzy Graul  MSOT, OTR/L ?Acute Rehab ?Pager: [phone] ?Office: [phone] ? ?Jackquline Branca M Tiamarie Furnari ?04/18/2021, 8:27 AM ?

## 2021-04-18 NOTE — Progress Notes (Signed)
? ?Progress Note ? ?Patient Name: Jeanne Lawson ?Date of Encounter: 04/18/2021 ? ?[facility] Cardiologist: Freada Bergeron, MD  ? ?Subjective  ? ?Feeling better.  Her legs are improving significantly.  She is able to get around easier. ? ?Inpatient Medications  ?  ?Scheduled Meds: ? aspirin EC  81 mg Oral Q lunch  ? enoxaparin (LOVENOX) injection  40 mg Subcutaneous Daily  ? furosemide  40 mg Intravenous BID  ? metoprolol succinate  25 mg Oral BID  ? potassium chloride  40 mEq Oral BID  ? ?Continuous Infusions: ? ?PRN Meds: ?acetaminophen **OR** acetaminophen, albuterol, ondansetron (ZOFRAN) IV  ? ?Vital Signs  ?  ?Vitals:  ? 04/17/21 2319 04/18/21 5284 04/18/21 1324 04/18/21 4010  ?BP: (!) 125/58 120/65 (!) 128/58   ?Pulse: 65 71 62 65  ?Resp: '20 17 20   '$ ?Temp: 98.2 ?F (36.8 ?C) 99 ?F (37.2 ?C) 97.9 ?F (36.6 ?C)   ?TempSrc: Oral Oral Oral   ?SpO2: 93% 94% 97%   ?Weight:  71 kg    ?Height:      ? ? ?Intake/Output Summary (Last 24 hours) at 04/18/2021 0941 ?Last data filed at 04/18/2021 0330 ?Gross per 24 hour  ?Intake 840 ml  ?Output 1600 ml  ?Net -760 ml  ? ? ?  04/18/2021  ?  3:29 AM 04/17/2021  ?  4:48 AM 04/16/2021  ?  8:11 AM  ?Last 3 Weights  ?Weight (lbs) 156 lb 8.4 oz 172 lb 2.9 oz 167 lb 1.7 oz  ?Weight (kg) 71 kg 78.1 kg 75.8 kg  ?   ? ?Telemetry  ?  ?Sinus rhythm- Personally Reviewed ? ?ECG  ?  ?N/a - Personally Reviewed ? ?Physical Exam  ? ?VS:  BP (!) 128/58 (BP Location: Left Arm)   Pulse 65   Temp 97.9 ?F (36.6 ?C) (Oral)   Resp 20   Ht '5\' 1"'$  (1.549 m)   Wt 71 kg   SpO2 97%   BMI 29.58 kg/m?  , BMI Body mass index is 29.58 kg/m?. ?GENERAL:  Well appearing ?HEENT: Pupils equal round and reactive, fundi not visualized, oral mucosa unremarkable ?NECK:  No jugular venous distention, waveform within normal limits, carotid upstroke brisk and symmetric, no bruits, no thyromegaly ?LUNGS: Crackles at the left base ?HEART:  RRR.  PMI not displaced or sustained,S1 and S2 within normal limits, no S3,  no S4, no clicks, no rubs, no murmurs ?ABD:  Flat, positive bowel sounds normal in frequency in pitch, no bruits, no rebound, no guarding, no midline pulsatile mass, no hepatomegaly, no splenomegaly ?EXT:  2 plus pulses throughout, bilateral lymphedema to the mid tibia, no cyanosis no clubbing ?SKIN:  No rashes no nodules ?NEURO:  Cranial nerves II through XII grossly intact, motor grossly intact throughout ?PSYCH:  Cognitively intact, oriented to person place and time ? ? ?Labs  ?  ?High Sensitivity Troponin:  No results for input(s): TROPONINIHS in the last 720 hours.   ?Chemistry ?Recent Labs  ?Lab 04/15/21 ?2015 04/16/21 ?0257 04/16/21 ?0300 04/17/21 ?2725 04/18/21 ?0151  ?NA 137 140 140 139 136  ?K 3.5 3.5 3.4* 3.3* 2.7*  ?CL 108 104  --  101 98  ?CO2 24 28  --  29 33*  ?GLUCOSE 94 100*  --  81 104*  ?BUN 15 12  --  13 12  ?CREATININE 0.75 0.91  --  0.79 0.94  ?CALCIUM 8.2* 8.6*  --  8.1* 8.2*  ?MG  --  1.9  --   --   --   ?  PROT 5.8* 6.3*  --   --   --   ?ALBUMIN 3.1* 3.3*  --   --   --   ?AST 20 21  --   --   --   ?ALT 12 14  --   --   --   ?ALKPHOS 43 46  --   --   --   ?BILITOT 0.9 1.3*  --   --   --   ?GFRNONAA >60 >60  --  >60 >60  ?ANIONGAP 5 8  --  9 5  ?  ?Lipids No results for input(s): CHOL, TRIG, HDL, LABVLDL, LDLCALC, CHOLHDL in the last 168 hours.  ?Hematology ?Recent Labs  ?Lab 04/15/21 ?1438 04/16/21 ?0257 04/16/21 ?0300 04/18/21 ?0151  ?WBC 3.9* 4.5  --  4.1  ?RBC 3.88 3.98  --  3.68*  ?HGB 10.7* 11.0* 10.2* 10.2*  ?HCT 35.1* 34.7* 30.0* 31.3*  ?MCV 90.5 87.2  --  85.1  ?MCH 27.6 27.6  --  27.7  ?MCHC 30.5 31.7  --  32.6  ?RDW 15.6* 15.6*  --  15.3  ?PLT 129* 109*  --  119*  ? ?Thyroid No results for input(s): TSH, FREET4 in the last 168 hours.  ?BNP ?Recent Labs  ?Lab 04/16/21 ?0258  ?BNP 303.8*  ?  ?DDimer No results for input(s): DDIMER in the last 168 hours.  ? ?Radiology  ?  ?ECHOCARDIOGRAM COMPLETE ? ?Result Date: 04/17/2021 ?   ECHOCARDIOGRAM REPORT   Patient Name:   Jeanne Lawson Date of  Exam: 04/17/2021 Medical Rec #:  [id number]      Height:       61.0 in Accession #:    [id number]     Weight:       172.2 lb Date of Birth:  02-06-1945     BSA:          1.772 m? Patient Age:    76 years       BP:           132/71 mmHg Patient Gender: F              HR:           59 bpm. Exam Location:  Inpatient Procedure: 2D Echo Indications:    acute diastolic chf  History:        Patient has prior history of Echocardiogram examinations, most                 recent 03/13/2015. CHF, COPD, Signs/Symptoms:Edema; Risk                 Factors:Hypertension.  Sonographer:    Johny Chess RDCS Referring Phys: [id number] [place]  1. Left ventricular ejection fraction, by estimation, is 65 to 70%. The left ventricle has normal function. The left ventricle has no regional wall motion abnormalities. There is mild left ventricular hypertrophy. Left ventricular diastolic parameters are consistent with Grade I diastolic dysfunction (impaired relaxation).  2. Right ventricular systolic function is normal. The right ventricular size is normal. Tricuspid regurgitation signal is inadequate for assessing PA pressure.  3. The mitral valve is grossly normal. Trivial mitral valve regurgitation.  4. The aortic valve is tricuspid. Aortic valve regurgitation is not visualized.  5. The inferior vena cava is dilated in size with >50% respiratory variability, suggesting right atrial pressure of 8 mmHg. Comparison(s): Changes from prior study are noted. 03/13/2015: LVEF 60-65%, moderate LVH, grade 1 DD. FINDINGS  Left Ventricle: Left  ventricular ejection fraction, by estimation, is 65 to 70%. The left ventricle has normal function. The left ventricle has no regional wall motion abnormalities. The left ventricular internal cavity size was normal in size. There is  mild left ventricular hypertrophy. Left ventricular diastolic parameters are consistent with Grade I diastolic dysfunction (impaired relaxation). Indeterminate  filling pressures. Right Ventricle: The right ventricular size is normal. No increase in right ventricular wall thickness. Right ventricular systolic function is normal. Tricuspid regurgitation signal is inadequate for assessing PA pressure. Left Atrium: Left atrial size was normal in size. Right Atrium: Right atrial size was normal in size. Pericardium: There is no evidence of pericardial effusion. Presence of epicardial fat layer. Mitral Valve: The mitral valve is grossly normal. Trivial mitral valve regurgitation. Tricuspid Valve: The tricuspid valve is grossly normal. Tricuspid valve regurgitation is trivial. Aortic Valve: The aortic valve is tricuspid. Aortic valve regurgitation is not visualized. Pulmonic Valve: The pulmonic valve was normal in structure. Pulmonic valve regurgitation is not visualized. Aorta: The aortic root and ascending aorta are structurally normal, with no evidence of dilitation. Venous: The inferior vena cava is dilated in size with greater than 50% respiratory variability, suggesting right atrial pressure of 8 mmHg. IAS/Shunts: No atrial level shunt detected by color flow Doppler.  LEFT VENTRICLE PLAX 2D LVIDd:         5.10 cm   Diastology LVIDs:         3.20 cm   LV e' medial:    6.09 cm/s LV PW:         1.00 cm   LV E/e' medial:  12.1 LV IVS:        1.10 cm   LV e' lateral:   6.31 cm/s LVOT diam:     2.00 cm   LV E/e' lateral: 11.7 LV SV:         80 LV SV Index:   45 LVOT Area:     3.14 cm?  RIGHT VENTRICLE             IVC RV S prime:     16.00 cm/s  IVC diam: 2.10 cm LEFT ATRIUM             Index        RIGHT ATRIUM           Index LA diam:        4.50 cm 2.54 cm/m?   RA Area:     11.80 cm? LA Vol (A2C):   69.2 ml 39.05 ml/m?  RA Volume:   25.10 ml  14.16 ml/m? LA Vol (A4C):   39.4 ml 22.23 ml/m? LA Biplane Vol: 54.2 ml 30.58 ml/m?  AORTIC VALVE LVOT Vmax:   99.40 cm/s LVOT Vmean:  64.300 cm/s LVOT VTI:    0.255 m  AORTA Ao Root diam: 3.20 cm Ao Asc diam:  3.10 cm MITRAL VALVE MV Area  (PHT): 2.87 cm?     SHUNTS MV Decel Time: 264 msec     Systemic VTI:  0.26 m MV E velocity: 73.70 cm/s   Systemic Diam: 2.00 cm MV A velocity: 119.00 cm/s MV E/A ratio:  0.62 Lyman Bishop MD Electronically s

## 2021-04-18 NOTE — Progress Notes (Signed)
? ? ? [facility] ?                                                                            ? ? ?Jeanne Lawson, is a 76 y.o. female, DOB - 10/09/1945, ITG:[id number] ?Admit date - 04/15/2021    ?Outpatient Primary MD for the patient is [name], MD ? ?LOS - 3  days ? ? ? ?Brief summary  ? ?Patient is a 76 year old female with history of chronic diastolic CHF, chronic edema bilateral lower extremities, COPD, HTN presented with worsening of her chronic edema.  Patient reported that she noticed worsening of the edema with redness in the last 4 to 5 days, inability to ambulate due to swelling.  In the last 1 to 2 days she noticed symmetrical erythema in the lower extremities but no warmth or drainage or fevers. ?Lower extremity ultrasound showed no evidence of acute DVT.  She was noted to have some confusion in ED, CT head was negative. ?Received Lasix 40 mg IV x1 in ED. ? ?Cardiology consulted ? ? ?Assessment & Plan  ? ? ?Assessment and Plan: ?* Acute on chronic diastolic CHF (congestive heart failure) ([place]) ?-Presented with progressively worsening acute on chronic edema of bilateral LE.  Elevated BNP 303.8 ?-Patient reports good compliance with outpatient Lasix 40 mg daily  ?-she was started on IV lasix, with improvement in pedal edema and breathing.  ?-Cardiology consulted and recommendations given. ?-She also appears to have chronic lymphedema, will benefit from wrapping/Unna boots, TED hoses or lymphedema clinic. ?-echocardiogram ordered and pending.  ?-Doppler ultrasound lower extremities negative for DVT ?- will need lymphedema clinic on discharge.  ? ?Acute encephalopathy ?- Patient had reported 1 day of progressive confusion on admission, currently resolved  ?-ABG with no hypercarbia, metabolic panel within normal limits, no UTI ?-CT head with no acute intracranial abnormality, moderate to severe small vessel disease.  Possibly had sundowning, delirium, unclear if she has been developing  dementia.   ?- she is alert and answering all questions appropriately.  ?She lives in independent living facility.  ? ? ? ?Hypokalemia:  ?Replaced.  ? ?COPD (chronic obstructive pulmonary disease) ([place]) ?-Has 30-pack-year history, currently not wheezing  ?-Continue albuterol as needed  ? ?Hypertension ?- Continue IV Lasix, BP stable ? ?  ? ?  ? ?Estimated body mass index is 29.58 kg/m? as calculated from the following: ?  Height as of this encounter: '5\' 1"'$  (1.549 m). ?  Weight as of this encounter: 71 kg. ? ?Code Status: full code.  ?DVT Prophylaxis:  enoxaparin (LOVENOX) injection 40 mg Start: 04/16/21 1000 ? ? ?Level of Care: Level of care: Telemetry Medical ?Family Communication: family at bedside.  ? ?Disposition Plan:     Remains inpatient appropriate: IV lasix.  ? ?Procedures:  ?None  ? ?Consultants:   ?Cardiology.  ? ?Antimicrobials:  ? ?Anti-infectives (From admission, onward)  ? ? None  ? ?  ? ? ? ?Medications ? ?Scheduled Meds: ? aspirin EC  81 mg Oral Q lunch  ? enoxaparin (LOVENOX) injection  40 mg Subcutaneous Daily  ? furosemide  40 mg Intravenous BID  ? metoprolol succinate  25 mg Oral BID  ? potassium chloride  40 mEq Oral BID  ? ?Continuous Infusions: ?PRN Meds:.acetaminophen **OR** acetaminophen, albuterol, ondansetron (ZOFRAN) IV ? ? ? ?Subjective:  ? ?[name] was seen and examined today.   ?Breathing is improving.  ? ?Objective:  ? ?Vitals:  ? 04/18/21 0329 04/18/21 1610 04/18/21 0918 04/18/21 1156  ?BP: 120/65 (!) 128/58    ?Pulse: 71 62 65   ?Resp: 17 20    ?Temp: 99 ?F (37.2 ?C) 97.9 ?F (36.6 ?C)  98.4 ?F (36.9 ?C)  ?TempSrc: Oral Oral  Oral  ?SpO2: 94% 97%    ?Weight: 71 kg     ?Height:      ? ? ?Intake/Output Summary (Last 24 hours) at 04/18/2021 1555 ?Last data filed at 04/18/2021 1200 ?Gross per 24 hour  ?Intake 1080 ml  ?Output 1600 ml  ?Net -520 ml  ? ?Filed Weights  ? 04/16/21 0811 04/17/21 0448 04/18/21 0329  ?Weight: 75.8 kg 78.1 kg 71 kg  ? ? ? ?Exam ?General exam: Appears calm  and comfortable  ?Respiratory system: Clear to auscultation. Respiratory effort normal. ?Cardiovascular system: S1 & S2 heard, RRR. No JVD, lymphedema.  ?Gastrointestinal system: Abdomen is nondistended, soft and nontender. Normal bowel sounds heard. ?Central nervous system: Alert and oriented. No focal neurological deficits. ?Extremities: bilateral chronic lower extremity edema.  ?Skin: No rashes, lesions or ulcers ?Psychiatry:  Mood & affect appropriate.  ? ? ?Data Reviewed:  I have personally reviewed following labs and imaging studies ? ? ?CBC ?Lab Results  ?Component Value Date  ? WBC 4.1 04/18/2021  ? RBC 3.68 (L) 04/18/2021  ? HGB 10.2 (L) 04/18/2021  ? HCT 31.3 (L) 04/18/2021  ? MCV 85.1 04/18/2021  ? MCH 27.7 04/18/2021  ? PLT 119 (L) 04/18/2021  ? MCHC 32.6 04/18/2021  ? RDW 15.3 04/18/2021  ? LYMPHSABS 0.9 04/16/2021  ? MONOABS 0.5 04/16/2021  ? EOSABS 0.0 04/16/2021  ? BASOSABS 0.0 04/16/2021  ? ? ? ?Last metabolic panel ?Lab Results  ?Component Value Date  ? NA 136 04/18/2021  ? K 2.7 (LL) 04/18/2021  ? CL 98 04/18/2021  ? CO2 33 (H) 04/18/2021  ? BUN 12 04/18/2021  ? CREATININE 0.94 04/18/2021  ? GLUCOSE 104 (H) 04/18/2021  ? GFRNONAA >60 04/18/2021  ? GFRAA 95 07/22/2019  ? CALCIUM 8.2 (L) 04/18/2021  ? PHOS 2.6 08/24/2017  ? PROT 6.3 (L) 04/16/2021  ? ALBUMIN 3.3 (L) 04/16/2021  ? LABGLOB 2.6 07/22/2019  ? AGRATIO 1.5 07/22/2019  ? BILITOT 1.3 (H) 04/16/2021  ? ALKPHOS 46 04/16/2021  ? AST 21 04/16/2021  ? ALT 14 04/16/2021  ? ANIONGAP 5 04/18/2021  ? ? ?CBG (last 3)  ?Recent Labs  ?  04/15/21 ?2347  ?GLUCAP 116*  ?  ? ? ?Coagulation Profile: ?No results for input(s): INR, PROTIME in the last 168 hours. ? ? ?Radiology Studies: ?ECHOCARDIOGRAM COMPLETE ? ?Result Date: 04/17/2021 ?   ECHOCARDIOGRAM REPORT   Patient Name:   LEANNDRA PEMBER Date of Exam: 04/17/2021 Medical Rec #:  [id number]      Height:       61.0 in Accession #:    [id number]     Weight:       172.2 lb Date of Birth:  1945/06/28     BSA:           1.772 m? Patient Age:    79 years       BP:           132/71 mmHg Patient Gender: F  HR:           59 bpm. Exam Location:  Inpatient Procedure: 2D Echo Indications:    acute diastolic chf  History:        Patient has prior history of Echocardiogram examinations, most                 recent 03/13/2015. CHF, COPD, Signs/Symptoms:Edema; Risk                 Factors:Hypertension.  Sonographer:    Johny Chess RDCS Referring Phys: [id number] [place]  1. Left ventricular ejection fraction, by estimation, is 65 to 70%. The left ventricle has normal function. The left ventricle has no regional wall motion abnormalities. There is mild left ventricular hypertrophy. Left ventricular diastolic parameters are consistent with Grade I diastolic dysfunction (impaired relaxation).  2. Right ventricular systolic function is normal. The right ventricular size is normal. Tricuspid regurgitation signal is inadequate for assessing PA pressure.  3. The mitral valve is grossly normal. Trivial mitral valve regurgitation.  4. The aortic valve is tricuspid. Aortic valve regurgitation is not visualized.  5. The inferior vena cava is dilated in size with >50% respiratory variability, suggesting right atrial pressure of 8 mmHg. Comparison(s): Changes from prior study are noted. 03/13/2015: LVEF 60-65%, moderate LVH, grade 1 DD. FINDINGS  Left Ventricle: Left ventricular ejection fraction, by estimation, is 65 to 70%. The left ventricle has normal function. The left ventricle has no regional wall motion abnormalities. The left ventricular internal cavity size was normal in size. There is  mild left ventricular hypertrophy. Left ventricular diastolic parameters are consistent with Grade I diastolic dysfunction (impaired relaxation). Indeterminate filling pressures. Right Ventricle: The right ventricular size is normal. No increase in right ventricular wall thickness. Right ventricular systolic function is  normal. Tricuspid regurgitation signal is inadequate for assessing PA pressure. Left Atrium: Left atrial size was normal in size. Right Atrium: Right atrial size was normal in size. Pericardium: There is no

## 2021-04-19 DIAGNOSIS — I5033 Acute on chronic diastolic (congestive) heart failure: Secondary | ICD-10-CM | POA: Diagnosis not present

## 2021-04-19 DIAGNOSIS — I10 Essential (primary) hypertension: Secondary | ICD-10-CM | POA: Diagnosis not present

## 2021-04-19 DIAGNOSIS — I890 Lymphedema, not elsewhere classified: Secondary | ICD-10-CM | POA: Diagnosis not present

## 2021-04-19 LAB — BASIC METABOLIC PANEL
Anion gap: 9 (ref 5–15)
BUN: 11 mg/dL (ref 8–23)
CO2: 32 mmol/L (ref 22–32)
Calcium: 8.4 mg/dL — ABNORMAL LOW (ref 8.9–10.3)
Chloride: 97 mmol/L — ABNORMAL LOW (ref 98–111)
Creatinine, Ser: 0.81 mg/dL (ref 0.44–1.00)
GFR, Estimated: >60 mL/min (ref >60)
Glucose, Bld: 94 mg/dL (ref 70–99)
Potassium: 3.9 mmol/L (ref 3.5–5.1)
Sodium: 138 mmol/L (ref 135–145)

## 2021-04-19 LAB — LIPID PANEL
Cholesterol: 115 mg/dL (ref 0–200)
HDL: 52 mg/dL (ref >40)
LDL Cholesterol: 46 mg/dL (ref 0–99)
Total CHOL/HDL Ratio: 2.2 RATIO
Triglycerides: 85 mg/dL (ref <150)
VLDL: 17 mg/dL (ref 0–40)

## 2021-04-19 NOTE — Progress Notes (Signed)
Mobility Specialist Progress Note  ? ? 04/19/21 1736  ?Mobility  ?Activity Ambulated with assistance in hallway  ?Level of Assistance Contact guard assist, steadying assist  ?Assistive Device Front wheel walker  ?Distance Ambulated (ft) 260 ft  ?Activity Response Tolerated well  ?$Mobility charge 1 Mobility  ? ?Pt received sitting EOB and agreeable. No complaints on walk. Returned to sitting EOB with call bell in reach.   ? ?Hildred Alamin ?Mobility Specialist  ?  ?

## 2021-04-19 NOTE — Progress Notes (Signed)
Pt claimed that she had a big BM in the bathroom. ?

## 2021-04-19 NOTE — Progress Notes (Signed)
Physical Therapy Treatment ?Patient Details ?Name: Jeanne Lawson ?MRN: [id number] ?DOB: Jun 23, 1945 ?Today's Date: 04/19/2021 ? ? ?History of Present Illness 76 y.o. F who presents 04/15/2021 with worsening of her chronic edema. Admitted with acute on chronic diastolic CHF. Significant PMH: chronic diastolic CHF, chronic edema bilateral lower extremities, COPD, HTN. ? ?  ?PT Comments  ? ? Pt progressing well towards her physical therapy goals, exhibiting improved activity tolerance and ambulation distance. Pt ambulating 160 ft with a walker at a min guard assist level. Will continue to follow acutely. ?   ?Recommendations for follow up therapy are one component of a multi-disciplinary discharge planning process, led by the attending physician.  Recommendations may be updated based on patient status, additional functional criteria and insurance authorization. ? ?Follow Up Recommendations ? Outpatient PT (pt preference for [facility] on [address]) ?  ?  ?Assistance Recommended at Discharge PRN  ?Patient can return home with the following Assist for transportation ?  ?Equipment Recommendations ? None recommended by PT  ?  ?Recommendations for Other Services   ? ? ?  ?Precautions / Restrictions Precautions ?Precautions: Fall ?Restrictions ?Weight Bearing Restrictions: No  ?  ? ?Mobility ? Bed Mobility ?Overal bed mobility: Needs Assistance ?Bed Mobility: Sit to Supine ?  ?  ?  ?Sit to supine: Mod assist ?  ?General bed mobility comments: Assist for BLE's; typically sleeps in recliner ?  ? ?Transfers ?Overall transfer level: Needs assistance ?Equipment used: Rolling walker (2 wheels) ?Transfers: Sit to/from Stand ?Sit to Stand: Supervision ?  ?  ?  ?  ?  ?General transfer comment: no physical assist required ?  ? ?Ambulation/Gait ?Ambulation/Gait assistance: Min guard ?Gait Distance (Feet): 160 Feet ?Assistive device: Rolling walker (2 wheels) ?Gait Pattern/deviations: Decreased stride length, Step-through pattern, Decreased  dorsiflexion - left ?Gait velocity: 0.77 ft/s ?Gait velocity interpretation: <1.31 ft/sec, indicative of household ambulator ?  ?General Gait Details: Cues for walker proximity, pt with LLE circumduction and decreased bilateral foot clearance ? ? ?Stairs ?  ?  ?  ?  ?  ? ? ?Wheelchair Mobility ?  ? ?Modified Rankin (Stroke Patients Only) ?  ? ? ?  ?Balance Overall balance assessment: Needs assistance ?Sitting-balance support: Feet supported ?Sitting balance-Leahy Scale: Good ?  ?  ?Standing balance support: Single extremity supported, During functional activity ?Standing balance-Leahy Scale: Poor ?  ?  ?  ?  ?  ?  ?  ?  ?  ?  ?  ?  ?  ? ?  ?Cognition Arousal/Alertness: Awake/alert ?Behavior During Therapy: [facility] for tasks assessed/performed ?Overall Cognitive Status: Impaired/Different from baseline ?Area of Impairment: Memory, Problem solving ?  ?  ?  ?  ?  ?  ?  ?  ?  ?  ?Memory: Decreased short-term memory ?  ?  ?  ?Problem Solving: Slow processing, Difficulty sequencing, Requires verbal cues ?  ?  ?  ? ?  ?Exercises   ? ?  ?General Comments General comments (skin integrity, edema, etc.): HR 68-102 ?  ?  ? ?Pertinent Vitals/Pain Pain Assessment ?Pain Assessment: Faces ?Faces Pain Scale: Hurts a little bit ?Pain Location: BLE's ?Pain Descriptors / Indicators: Tender ?Pain Intervention(s): Monitored during session  ? ? ?Home Living   ?  ?  ?  ?  ?  ?  ?  ?  ?  ?   ?  ?Prior Function    ?  ?  ?   ? ?PT Goals (current goals can  now be found in the care plan section) Acute Rehab PT Goals ?Patient Stated Goal: walk more ?Potential to Achieve Goals: Good ?Progress towards PT goals: Progressing toward goals ? ?  ?Frequency ? ? ? Min 3X/week ? ? ? ?  ?PT Plan Current plan remains appropriate  ? ? ?Co-evaluation   ?  ?  ?  ?  ? ?  ?AM-PAC PT "6 Clicks" Mobility   ?Outcome Measure ? Help needed turning from your back to your side while in a flat bed without using bedrails?: A Little ?Help needed moving from lying on your back  to sitting on the side of a flat bed without using bedrails?: A Little ?Help needed moving to and from a bed to a chair (including a wheelchair)?: A Little ?Help needed standing up from a chair using your arms (e.g., wheelchair or bedside chair)?: A Little ?Help needed to walk in hospital room?: A Little ?Help needed climbing 3-5 steps with a railing? : A Lot ?6 Click Score: 17 ? ?  ?End of Session   ?Activity Tolerance: Patient tolerated treatment well ?Patient left: in bed;with call bell/phone within reach ?Nurse Communication: Mobility status ?PT Visit Diagnosis: Unsteadiness on feet (R26.81);Other abnormalities of gait and mobility (R26.89);Difficulty in walking, not elsewhere classified (R26.2) ?  ? ? ?Time: [phone] ?PT Time Calculation (min) (ACUTE ONLY): 25 min ? ?Charges:  $Gait Training: 8-22 mins ?$Therapeutic Activity: 8-22 mins          ?          ? ?Wyona Almas, PT, DPT ?Acute Rehabilitation Services ?Pager [phone] ?Office [phone] ? ? ? ?Deno Etienne ?04/19/2021, 11:05 AM ? ?

## 2021-04-19 NOTE — TOC Progression Note (Signed)
Transition of Care (TOC) - Progression Note  ? ? ?Patient Details  ?Name: Jeanne Lawson ?MRN: [id number] ?Date of Birth: 1945/05/28 ? ?Transition of Care (TOC) CM/SW Contact  ?Angelita Ingles, RN ?Phone Number:[phone] ? ?04/19/2021, 3:30 PM ? ?Clinical Narrative:    ?TOC acknowledges outpatient  PT referral. CM will speak with patient to assess need for outpatient orders.  ? ? ?  ?  ? ?Expected Discharge Plan and Services ?  ?  ?  ?  ?  ?                ?  ?  ?  ?  ?  ?  ?  ?  ?  ?  ? ? ?Social Determinants of Health (SDOH) Interventions ?  ? ?Readmission Risk Interventions ?   ? View : No data to display.  ?  ?  ?  ? ? ?

## 2021-04-19 NOTE — Progress Notes (Signed)
? ?Progress Note ? ?Patient Name: Jeanne Lawson ?Date of Encounter: 04/19/2021 ? ?[facility] Cardiologist: Freada Bergeron, MD  ? ?Subjective  ? ?Feeling better.  Her legs are improving significantly.  She is able to get around easier. Looking forward to PT today.  ? ?Inpatient Medications  ?  ?Scheduled Meds: ? aspirin EC  81 mg Oral Q lunch  ? enoxaparin (LOVENOX) injection  40 mg Subcutaneous Daily  ? furosemide  40 mg Intravenous BID  ? metoprolol succinate  25 mg Oral BID  ? ?Continuous Infusions: ? ?PRN Meds: ?acetaminophen **OR** acetaminophen, albuterol, ondansetron (ZOFRAN) IV  ? ?Vital Signs  ?  ?Vitals:  ? 04/19/21 0313 04/19/21 0508 04/19/21 0800 04/19/21 0829  ?BP: (!) 146/84   103/76  ?Pulse: 66   73  ?Resp: 16   14  ?Temp: 98.3 ?F (36.8 ?C)   98.9 ?F (37.2 ?C)  ?TempSrc: Oral   Oral  ?SpO2: 93%  93% 94%  ?Weight:  69.9 kg    ?Height:      ? ? ?Intake/Output Summary (Last 24 hours) at 04/19/2021 0838 ?Last data filed at 04/19/2021 0510 ?Gross per 24 hour  ?Intake 1200 ml  ?Output 1000 ml  ?Net 200 ml  ? ? ?  04/19/2021  ?  5:08 AM 04/18/2021  ?  3:29 AM 04/17/2021  ?  4:48 AM  ?Last 3 Weights  ?Weight (lbs) 154 lb 1.6 oz 156 lb 8.4 oz 172 lb 2.9 oz  ?Weight (kg) 69.9 kg 71 kg 78.1 kg  ?   ? ?Telemetry  ?  ?Sinus rhythm- Personally Reviewed ? ?ECG  ?  ?N/a - Personally Reviewed ? ?Physical Exam  ? ?VS:  BP 103/76 (BP Location: Left Arm)   Pulse 73   Temp 98.9 ?F (37.2 ?C) (Oral)   Resp 14   Ht '5\' 1"'$  (1.549 m)   Wt 69.9 kg   SpO2 94%   BMI 29.12 kg/m?  , BMI Body mass index is 29.12 kg/m?. ?GENERAL:  Well appearing ?HEENT: Pupils equal round and reactive, fundi not visualized, oral mucosa unremarkable ?NECK:  No jugular venous distention, waveform within normal limits, carotid upstroke brisk and symmetric, no bruits, no thyromegaly ?LUNGS: Crackles at the left base ?HEART:  RRR.  PMI not displaced or sustained,S1 and S2 within normal limits, no S3, no S4, no clicks, no rubs, no murmurs ?ABD:   Flat, positive bowel sounds normal in frequency in pitch, no bruits, no rebound, no guarding, no midline pulsatile mass, no hepatomegaly, no splenomegaly ?EXT:  2 plus pulses throughout, bilateral lymphedema to the mid tibia, no cyanosis no clubbing ?SKIN:  No rashes no nodules ?NEURO:  Cranial nerves II through XII grossly intact, motor grossly intact throughout ?PSYCH:  Cognitively intact, oriented to person place and time ? ? ?Labs  ?  ?High Sensitivity Troponin:  No results for input(s): TROPONINIHS in the last 720 hours.   ?Chemistry ?Recent Labs  ?Lab 04/15/21 ?2015 04/16/21 ?0257 04/16/21 ?0300 04/17/21 ?9678 04/18/21 ?0151 04/18/21 ?1818 04/19/21 ?9381  ?NA 137 140   < > 139 136  --  138  ?K 3.5 3.5   < > 3.3* 2.7* 3.9 3.9  ?CL 108 104  --  101 98  --  97*  ?CO2 24 28  --  29 33*  --  32  ?GLUCOSE 94 100*  --  81 104*  --  94  ?BUN 15 12  --  13 12  --  11  ?  CREATININE 0.75 0.91  --  0.79 0.94  --  0.81  ?CALCIUM 8.2* 8.6*  --  8.1* 8.2*  --  8.4*  ?MG  --  1.9  --   --  1.8  --   --   ?PROT 5.8* 6.3*  --   --   --   --   --   ?ALBUMIN 3.1* 3.3*  --   --   --   --   --   ?AST 20 21  --   --   --   --   --   ?ALT 12 14  --   --   --   --   --   ?ALKPHOS 43 46  --   --   --   --   --   ?BILITOT 0.9 1.3*  --   --   --   --   --   ?GFRNONAA >60 >60  --  >60 >60  --  >60  ?ANIONGAP 5 8  --  9 5  --  9  ? < > = values in this interval not displayed.  ?  ?Lipids  ?Recent Labs  ?Lab 04/19/21 ?6213  ?CHOL 115  ?TRIG 85  ?HDL 52  ?[place] 46  ?CHOLHDL 2.2  ?  ?Hematology ?Recent Labs  ?Lab 04/15/21 ?1438 04/16/21 ?0257 04/16/21 ?0300 04/18/21 ?0151  ?WBC 3.9* 4.5  --  4.1  ?RBC 3.88 3.98  --  3.68*  ?HGB 10.7* 11.0* 10.2* 10.2*  ?HCT 35.1* 34.7* 30.0* 31.3*  ?MCV 90.5 87.2  --  85.1  ?MCH 27.6 27.6  --  27.7  ?MCHC 30.5 31.7  --  32.6  ?RDW 15.6* 15.6*  --  15.3  ?PLT 129* 109*  --  119*  ? ?Thyroid  ?Recent Labs  ?Lab 04/17/21 ?0629  ?TSH 2.357  ?  ?BNP ?Recent Labs  ?Lab 04/16/21 ?0258  ?BNP 303.8*  ?  ?DDimer No  results for input(s): DDIMER in the last 168 hours.  ? ?Radiology  ?  ?ECHOCARDIOGRAM COMPLETE ? ?Result Date: 04/17/2021 ?   ECHOCARDIOGRAM REPORT   Patient Name:   Jeanne Lawson Date of Exam: 04/17/2021 Medical Rec #:  [id number]      Height:       61.0 in Accession #:    [id number]     Weight:       172.2 lb Date of Birth:  July 18, 1945     BSA:          1.772 m? Patient Age:    76 years       BP:           132/71 mmHg Patient Gender: F              HR:           59 bpm. Exam Location:  Inpatient Procedure: 2D Echo Indications:    acute diastolic chf  History:        Patient has prior history of Echocardiogram examinations, most                 recent 03/13/2015. CHF, COPD, Signs/Symptoms:Edema; Risk                 Factors:Hypertension.  Sonographer:    Johny Chess RDCS Referring Phys: [id number] [place]  1. Left ventricular ejection fraction, by estimation, is 65 to 70%. The left ventricle has normal function. The left ventricle has no regional wall  motion abnormalities. There is mild left ventricular hypertrophy. Left ventricular diastolic parameters are consistent with Grade I diastolic dysfunction (impaired relaxation).  2. Right ventricular systolic function is normal. The right ventricular size is normal. Tricuspid regurgitation signal is inadequate for assessing PA pressure.  3. The mitral valve is grossly normal. Trivial mitral valve regurgitation.  4. The aortic valve is tricuspid. Aortic valve regurgitation is not visualized.  5. The inferior vena cava is dilated in size with >50% respiratory variability, suggesting right atrial pressure of 8 mmHg. Comparison(s): Changes from prior study are noted. 03/13/2015: LVEF 60-65%, moderate LVH, grade 1 DD. FINDINGS  Left Ventricle: Left ventricular ejection fraction, by estimation, is 65 to 70%. The left ventricle has normal function. The left ventricle has no regional wall motion abnormalities. The left ventricular internal cavity size was  normal in size. There is  mild left ventricular hypertrophy. Left ventricular diastolic parameters are consistent with Grade I diastolic dysfunction (impaired relaxation). Indeterminate filling pressures. Right Ventricle: The right ventricular size is normal. No increase in right ventricular wall thickness. Right ventricular systolic function is normal. Tricuspid regurgitation signal is inadequate for assessing PA pressure. Left Atrium: Left atrial size was normal in size. Right Atrium: Right atrial size was normal in size. Pericardium: There is no evidence of pericardial effusion. Presence of epicardial fat layer. Mitral Valve: The mitral valve is grossly normal. Trivial mitral valve regurgitation. Tricuspid Valve: The tricuspid valve is grossly normal. Tricuspid valve regurgitation is trivial. Aortic Valve: The aortic valve is tricuspid. Aortic valve regurgitation is not visualized. Pulmonic Valve: The pulmonic valve was normal in structure. Pulmonic valve regurgitation is not visualized. Aorta: The aortic root and ascending aorta are structurally normal, with no evidence of dilitation. Venous: The inferior vena cava is dilated in size with greater than 50% respiratory variability, suggesting right atrial pressure of 8 mmHg. IAS/Shunts: No atrial level shunt detected by color flow Doppler.  LEFT VENTRICLE PLAX 2D LVIDd:         5.10 cm   Diastology LVIDs:         3.20 cm   LV e' medial:    6.09 cm/s LV PW:         1.00 cm   LV E/e' medial:  12.1 LV IVS:        1.10 cm   LV e' lateral:   6.31 cm/s LVOT diam:     2.00 cm   LV E/e' lateral: 11.7 LV SV:         80 LV SV Index:   45 LVOT Area:     3.14 cm?  RIGHT VENTRICLE             IVC RV S prime:     16.00 cm/s  IVC diam: 2.10 cm LEFT ATRIUM             Index        RIGHT ATRIUM           Index LA diam:        4.50 cm 2.54 cm/m?   RA Area:     11.80 cm? LA Vol (A2C):   69.2 ml 39.05 ml/m?  RA Volume:   25.10 ml  14.16 ml/m? LA Vol (A4C):   39.4 ml 22.23 ml/m? LA  Biplane Vol: 54.2 ml 30.58 ml/m?  AORTIC VALVE LVOT Vmax:   99.40 cm/s LVOT Vmean:  64.300 cm/s LVOT VTI:    0.255 m  AORTA Ao Root diam: 3.20 cm Ao  Asc diam:  3.10 cm MITRAL VALVE MV Area (PHT): 2.87 cm?     [place]

## 2021-04-19 NOTE — Progress Notes (Signed)
? ? ? [facility] ?                                                                            ? ? ?Jeanne Lawson, is a 76 y.o. female, DOB - 04/17/1945, DZH:[id number] ?Admit date - 04/15/2021    ?Outpatient Primary MD for the patient is [name], MD ? ?LOS - 4  days ? ? ? ?Brief summary  ? ?Patient is a 76 year old female with history of chronic diastolic CHF, chronic edema bilateral lower extremities, COPD, HTN presented with worsening of her chronic edema.  Patient reported that she noticed worsening of the edema with redness in the last 4 to 5 days, inability to ambulate due to swelling.  In the last 1 to 2 days she noticed symmetrical erythema in the lower extremities but no warmth or drainage or fevers. ?Lower extremity ultrasound showed no evidence of acute DVT.  She was noted to have some confusion in ED, CT head was negative. ?Cardiology consulted . She was started on IV lasix wit good diuresis.  ?Therapy evaluations are pending.  ? ? ?Assessment & Plan  ? ? ?Assessment and Plan: ?* Acute on chronic diastolic CHF (congestive heart failure) ([place]) ?-Presented with progressively worsening acute on chronic edema of bilateral LE.  Elevated BNP 303.8 ?-Patient reports good compliance with outpatient Lasix 40 mg daily  ?-she was started on IV lasix, with improvement in pedal edema and breathing. Diuresed about 3.6 lit since admission.  ?-Cardiology consulted and recommendations given. ?-She also appears to have chronic lymphedema, will benefit from wrapping/Unna boots, TED hoses or lymphedema clinic. ?-echocardiogram showed Left ventricular ejection fraction is  65 to 70% with  normal function and  no regional  ?wall motion abnormalities. There is mild left ventricular hypertrophy.  ?Left ventricular diastolic parameters are consistent with Grade I diastolic dysfunction  ?-Doppler ultrasound lower extremities negative for DVT ?- will need lymphedema clinic on discharge.  ? ?Acute encephalopathy ?-  Patient had reported 1 day of progressive confusion on admission, currently resolved  ?-ABG with no hypercarbia, metabolic panel within normal limits, no UTI ?-CT head with no acute intracranial abnormality, moderate to severe small vessel disease.  Possibly had sundowning, delirium, unclear if she has been developing dementia.   ?- she is alert and answering all questions appropriately.  ?She lives in independent living facility.  ?- therapy evaluations today.  ? ? ? ?Hypokalemia:  ?Replaced.  ? ?COPD (chronic obstructive pulmonary disease) ([place]) ?-Has 30-pack-year history, currently not wheezing  ?-Continue albuterol as needed  ?- remains on RA.  ? ?Hypertension ?- BP parameters are optimal.  ? ?  ? ?  ? ?Estimated body mass index is 29.12 kg/m? as calculated from the following: ?  Height as of this encounter: '5\' 1"'$  (1.549 m). ?  Weight as of this encounter: 69.9 kg. ? ?Code Status: full code.  ?DVT Prophylaxis:  enoxaparin (LOVENOX) injection 40 mg Start: 04/16/21 1000 ? ? ?Level of Care: Level of care: Telemetry Medical ?Family Communication: fNone at bedside.  ? ?Disposition Plan:     Remains inpatient appropriate: IV lasix.  ? ?Procedures:  ?None  ? ?Consultants:   ?Cardiology.  ? ?Antimicrobials:  ? ?  Anti-infectives (From admission, onward)  ? ? None  ? ?  ? ? ? ?Medications ? ?Scheduled Meds: ? aspirin EC  81 mg Oral Q lunch  ? enoxaparin (LOVENOX) injection  40 mg Subcutaneous Daily  ? furosemide  40 mg Intravenous BID  ? metoprolol succinate  25 mg Oral BID  ? ?Continuous Infusions: ?PRN Meds:.acetaminophen **OR** acetaminophen, albuterol, ondansetron (ZOFRAN) IV ? ? ? ?Subjective:  ? ?Jeanne Lawson was seen and examined today.   ?Pedal edema and breathing has improved. No chest pain or sob.  ? ?Objective:  ? ?Vitals:  ? 04/19/21 0313 04/19/21 0508 04/19/21 0800 04/19/21 0829  ?BP: (!) 146/84   103/76  ?Pulse: 66   73  ?Resp: 16   14  ?Temp: 98.3 ?F (36.8 ?C)   98.9 ?F (37.2 ?C)  ?TempSrc: Oral   Oral   ?SpO2: 93%  93% 94%  ?Weight:  69.9 kg    ?Height:      ? ? ?Intake/Output Summary (Last 24 hours) at 04/19/2021 0939 ?Last data filed at 04/19/2021 0510 ?Gross per 24 hour  ?Intake 1200 ml  ?Output 1000 ml  ?Net 200 ml  ? ? ?Filed Weights  ? 04/17/21 0448 04/18/21 0329 04/19/21 0508  ?Weight: 78.1 kg 71 kg 69.9 kg  ? ? ? ?Exam ?General exam: elderly woman, not in distress.  ?Respiratory system: Clear to auscultation. Respiratory effort normal. ?Cardiovascular system: S1 & S2 heard, RRR. No JVD, murmurs, rubs, gallops or clicks.  ?Gastrointestinal system: Abdomen is nondistended, soft and nontender. No organomegaly or masses felt. Normal bowel sounds heard. ?Central nervous system: Alert and oriented. No focal neurological deficits. ?Extremities: bilateral lymphaedema.  ?Skin: No rashes, lesions or ulcers ?Psychiatry: Mood & affect appropriate.  ? ? ? ?Data Reviewed:  I have personally reviewed following labs and imaging studies ? ? ?CBC ?Lab Results  ?Component Value Date  ? WBC 4.1 04/18/2021  ? RBC 3.68 (L) 04/18/2021  ? HGB 10.2 (L) 04/18/2021  ? HCT 31.3 (L) 04/18/2021  ? MCV 85.1 04/18/2021  ? MCH 27.7 04/18/2021  ? PLT 119 (L) 04/18/2021  ? MCHC 32.6 04/18/2021  ? RDW 15.3 04/18/2021  ? LYMPHSABS 0.9 04/16/2021  ? MONOABS 0.5 04/16/2021  ? EOSABS 0.0 04/16/2021  ? BASOSABS 0.0 04/16/2021  ? ? ? ?Last metabolic panel ?Lab Results  ?Component Value Date  ? NA 138 04/19/2021  ? K 3.9 04/19/2021  ? CL 97 (L) 04/19/2021  ? CO2 32 04/19/2021  ? BUN 11 04/19/2021  ? CREATININE 0.81 04/19/2021  ? GLUCOSE 94 04/19/2021  ? GFRNONAA >60 04/19/2021  ? GFRAA 95 07/22/2019  ? CALCIUM 8.4 (L) 04/19/2021  ? PHOS 2.6 08/24/2017  ? PROT 6.3 (L) 04/16/2021  ? ALBUMIN 3.3 (L) 04/16/2021  ? LABGLOB 2.6 07/22/2019  ? AGRATIO 1.5 07/22/2019  ? BILITOT 1.3 (H) 04/16/2021  ? ALKPHOS 46 04/16/2021  ? AST 21 04/16/2021  ? ALT 14 04/16/2021  ? ANIONGAP 9 04/19/2021  ? ? ?CBG (last 3)  ?No results for input(s): GLUCAP in the last 72  hours. ?  ? ? ?Coagulation Profile: ?No results for input(s): INR, PROTIME in the last 168 hours. ? ? ?Radiology Studies: ?ECHOCARDIOGRAM COMPLETE ? ?Result Date: 04/17/2021 ?   ECHOCARDIOGRAM REPORT   Patient Name:   BRIDEY BROOKOVER Date of Exam: 04/17/2021 Medical Rec #:  [id number]      Height:       61.0 in Accession #:    [id number]  Weight:       172.2 lb Date of Birth:  09-13-1945     BSA:          1.772 m? Patient Age:    36 years       BP:           132/71 mmHg Patient Gender: F              HR:           59 bpm. Exam Location:  Inpatient Procedure: 2D Echo Indications:    acute diastolic chf  History:        Patient has prior history of Echocardiogram examinations, most                 recent 03/13/2015. CHF, COPD, Signs/Symptoms:Edema; Risk                 Factors:Hypertension.  Sonographer:    Johny Chess RDCS Referring Phys: [id number] [place]  1. Left ventricular ejection fraction, by estimation, is 65 to 70%. The left ventricle has normal function. The left ventricle has no regional wall motion abnormalities. There is mild left ventricular hypertrophy. Left ventricular diastolic parameters are consistent with Grade I diastolic dysfunction (impaired relaxation).  2. Right ventricular systolic function is normal. The right ventricular size is normal. Tricuspid regurgitation signal is inadequate for assessing PA pressure.  3. The mitral valve is grossly normal. Trivial mitral valve regurgitation.  4. The aortic valve is tricuspid. Aortic valve regurgitation is not visualized.  5. The inferior vena cava is dilated in size with >50% respiratory variability, suggesting right atrial pressure of 8 mmHg. Comparison(s): Changes from prior study are noted. 03/13/2015: LVEF 60-65%, moderate LVH, grade 1 DD. FINDINGS  Left Ventricle: Left ventricular ejection fraction, by estimation, is 65 to 70%. The left ventricle has normal function. The left ventricle has no regional wall motion  abnormalities. The left ventricular internal cavity size was normal in size. There is  mild left ventricular hypertrophy. Left ventricular diastolic parameters are consistent with Grade I diastolic dysfunction (impaired

## 2021-04-20 DIAGNOSIS — I890 Lymphedema, not elsewhere classified: Secondary | ICD-10-CM | POA: Diagnosis not present

## 2021-04-20 DIAGNOSIS — I10 Essential (primary) hypertension: Secondary | ICD-10-CM | POA: Diagnosis not present

## 2021-04-20 DIAGNOSIS — J410 Simple chronic bronchitis: Secondary | ICD-10-CM | POA: Diagnosis not present

## 2021-04-20 DIAGNOSIS — I5033 Acute on chronic diastolic (congestive) heart failure: Secondary | ICD-10-CM | POA: Diagnosis not present

## 2021-04-20 LAB — METHYLMALONIC ACID, SERUM: Methylmalonic Acid, Quantitative: 354 nmol/L (ref 0–378)

## 2021-04-20 MED ORDER — FUROSEMIDE 10 MG/ML IJ SOLN
60.0000 mg | Freq: Two times a day (BID) | INTRAMUSCULAR | Status: AC
Start: 2021-04-20 — End: 2021-04-20
  Administered 2021-04-20 (×2): 60 mg via INTRAVENOUS
  Filled 2021-04-20 (×2): qty 6

## 2021-04-20 MED ORDER — POTASSIUM CHLORIDE CRYS ER 20 MEQ PO TBCR
40.0000 meq | EXTENDED_RELEASE_TABLET | Freq: Every day | ORAL | Status: DC
  Administered 2021-04-20 – 2021-04-22 (×3): 40 meq via ORAL
  Filled 2021-04-20 (×3): qty 2

## 2021-04-20 MED ORDER — FUROSEMIDE 10 MG/ML IJ SOLN
80.0000 mg | Freq: Two times a day (BID) | INTRAMUSCULAR | Status: DC
Start: 2021-04-20 — End: 2021-04-20

## 2021-04-20 MED ORDER — FUROSEMIDE 40 MG PO TABS
60.0000 mg | ORAL_TABLET | Freq: Two times a day (BID) | ORAL | Status: DC
  Administered 2021-04-21 (×2): 60 mg via ORAL
  Filled 2021-04-20 (×2): qty 1

## 2021-04-20 NOTE — Progress Notes (Signed)
?PROGRESS NOTE ? ? ? ?Jeanne Lawson  SNK:[id number] DOB: 20-Jan-1946 DOA: 04/15/2021 ?PCP: [name], MD  ?Narrative 75/F with chronic diastolic CHF, chronic severe lymphedema, COPD, hypertension presented with worsening swelling and discomfort in her legs X 4 to 5 days with difficulty ambulating, Dopplers were negative for DVT, she  was started on diuretics ? ? ?Subjective: ?-Feels better overall, denies any considerable amount of pain in her legs, swelling is improving, denies any dyspnea ? ?Assessment & Plan: ? ?* Acute on chronic diastolic CHF (congestive heart failure) ([place]) ?-Presented with progressively worsening acute on chronic edema of bilateral LE, predominantly right heart failure ?-Diuresed with IV Lasix, she is 3.7 L negative ?-Echo noted EF of 60-65%, mild LVH, grade 1 DD ?-Will refer to lymphedema clinic after discharge ?-Dopplers negative for DVT ?-Increase activity, PT eval ? ?Chronic lymphedema ?-See discussion above ? ?Acute encephalopathy ?- Patient had reported 1 day of progressive confusion on admission, currently resolved  ?-ABG with no hypercarbia, metabolic panel within normal limits, no UTI ?-CT head with no acute intracranial abnormality, moderate to severe small vessel disease. ?-Resolved ? ?COPD (chronic obstructive pulmonary disease) ([place]) ?-Has 30-pack-year history, currently not wheezing  ?-Continue albuterol as needed  ? ?Hypertension ?- Continue IV Lasix, BP stable ?  ? DVT prophylaxis: Lovenox ?Code Status: Full code ?Family Communication: Discussed with patient in detail, no family at bedside ?Disposition Plan: Home in 1 to 2 days ? ?Consultants:  ?Cardiology ? ?Procedures:  ? ?Antimicrobials:  ? ? ?Objective: ?Vitals:  ? 04/19/21 2350 04/20/21 0640 04/20/21 0805 04/20/21 1149  ?BP: (!) 108/52  123/70 132/63  ?Pulse: 63 (!) 51 67 60  ?Resp: '19  15 20  '$ ?Temp: 98.4 ?F (36.9 ?C)  97.7 ?F (36.5 ?C) 97.8 ?F (36.6 ?C)  ?TempSrc: Oral  Oral Oral  ?SpO2: 92% 93% 94% 97%  ?Weight:   72.5 kg    ?Height:      ? ? ?Intake/Output Summary (Last 24 hours) at 04/20/2021 1156 ?Last data filed at 04/20/2021 9379 ?Gross per 24 hour  ?Intake 710 ml  ?Output 101 ml  ?Net 609 ml  ? ?Filed Weights  ? 04/18/21 0329 04/19/21 0508 04/20/21 0640  ?Weight: 71 kg 69.9 kg 72.5 kg  ? ? ?Examination: ? ?General exam: Pleasant chronically ill female sitting up in bed, AAOx3, no distress ?HEENT: No JVD ?CVS: S1-S2, regular rate rhythm ?Lungs: Decreased breath sounds bases otherwise clear ?Abdomen: Soft, nontender, bowel sounds present ?Extremities: Bilateral lymphedema with chronic skin changes ?Psychiatry: Judgement and insight appear normal. Mood & affect appropriate.  ? ? ? ?Data Reviewed:  ? ?CBC: ?Recent Labs  ?Lab 04/15/21 ?1438 04/16/21 ?0257 04/16/21 ?0300 04/18/21 ?0151  ?WBC 3.9* 4.5  --  4.1  ?NEUTROABS 2.3 3.1  --   --   ?HGB 10.7* 11.0* 10.2* 10.2*  ?HCT 35.1* 34.7* 30.0* 31.3*  ?MCV 90.5 87.2  --  85.1  ?PLT 129* 109*  --  119*  ? ?Basic Metabolic Panel: ?Recent Labs  ?Lab 04/15/21 ?2015 04/16/21 ?0257 04/16/21 ?0300 04/17/21 ?0240 04/18/21 ?0151 04/18/21 ?1818 04/19/21 ?9735  ?NA 137 140 140 139 136  --  138  ?K 3.5 3.5 3.4* 3.3* 2.7* 3.9 3.9  ?CL 108 104  --  101 98  --  97*  ?CO2 24 28  --  29 33*  --  32  ?GLUCOSE 94 100*  --  81 104*  --  94  ?BUN 15 12  --  13 12  --  11  ?CREATININE 0.75 0.91  --  0.79 0.94  --  0.81  ?CALCIUM 8.2* 8.6*  --  8.1* 8.2*  --  8.4*  ?MG  --  1.9  --   --  1.8  --   --   ? ?GFR: ?Estimated Creatinine Clearance: 53 mL/min (by C-G formula based on SCr of 0.81 mg/dL). ?Liver Function Tests: ?Recent Labs  ?Lab 04/15/21 ?2015 04/16/21 ?0257  ?AST 20 21  ?ALT 12 14  ?ALKPHOS 43 46  ?BILITOT 0.9 1.3*  ?PROT 5.8* 6.3*  ?ALBUMIN 3.1* 3.3*  ? ?No results for input(s): LIPASE, AMYLASE in the last 168 hours. ?Recent Labs  ?Lab 04/15/21 ?2030  ?AMMONIA 26  ? ?Coagulation Profile: ?No results for input(s): INR, PROTIME in the last 168 hours. ?Cardiac Enzymes: ?Recent Labs  ?Lab  04/15/21 ?1438  ?CKTOTAL 203  ? ?BNP (last 3 results) ?No results for input(s): PROBNP in the last 8760 hours. ?HbA1C: ?No results for input(s): HGBA1C in the last 72 hours. ?CBG: ?Recent Labs  ?Lab 04/15/21 ?2347  ?GLUCAP 116*  ? ?Lipid Profile: ?Recent Labs  ?  04/19/21 ?5638  ?CHOL 115  ?HDL 52  ?[place] 46  ?TRIG 85  ?CHOLHDL 2.2  ? ?Thyroid Function Tests: ?No results for input(s): TSH, T4TOTAL, FREET4, T3FREE, THYROIDAB in the last 72 hours. ?Anemia Panel: ?No results for input(s): VITAMINB12, FOLATE, FERRITIN, TIBC, IRON, RETICCTPCT in the last 72 hours. ?Urine analysis: ?   ?Component Value Date/Time  ? COLORURINE COLORLESS (A) 04/16/2021 0115  ? APPEARANCEUR CLEAR 04/16/2021 0115  ? LABSPEC 1.005 04/16/2021 0115  ? PHURINE 7.0 04/16/2021 0115  ? [place] NEGATIVE 04/16/2021 0115  ? HGBUR SMALL (A) 04/16/2021 0115  ? [place] NEGATIVE 04/16/2021 0115  ? [place] NEGATIVE 04/16/2021 0115  ? [place] NEGATIVE 04/16/2021 0115  ? UROBILINOGEN 1.0 11/13/2014 1006  ? NITRITE NEGATIVE 04/16/2021 0115  ? LEUKOCYTESUR NEGATIVE 04/16/2021 0115  ? ?Sepsis Labs: ?'@LABRCNTIP'$ (procalcitonin:4,lacticidven:4) ? ?)No results found for this or any previous visit (from the past 240 hour(s)).  ? ?Radiology Studies: ?No results found. ? ? ?Scheduled Meds: ? aspirin EC  81 mg Oral Q lunch  ? enoxaparin (LOVENOX) injection  40 mg Subcutaneous Daily  ? furosemide  60 mg Intravenous BID  ? [START ON 04/21/2021] furosemide  60 mg Oral BID  ? metoprolol succinate  25 mg Oral BID  ? ?Continuous Infusions: ? ? LOS: 5 days  ? ? ?Time spent: 51mn ? ? ?PDomenic Polite MD ?[facility] ? ? ?04/20/2021, 11:56 AM  ?  ?

## 2021-04-20 NOTE — Progress Notes (Signed)
? ?Progress Note ? ?Patient Name: Jeanne Lawson ?Date of Encounter: 04/20/2021 ? ?[facility] Cardiologist: Freada Bergeron, MD  ? ?Subjective  ? ?Feeling better.  She reports that she had a lot of urine output yesterday that was missed.  Legs continue to feel softer and her breathing is better. ? ?Inpatient Medications  ?  ?Scheduled Meds: ? aspirin EC  81 mg Oral Q lunch  ? enoxaparin (LOVENOX) injection  40 mg Subcutaneous Daily  ? furosemide  60 mg Intravenous BID  ? metoprolol succinate  25 mg Oral BID  ? ?Continuous Infusions: ? ?PRN Meds: ?acetaminophen **OR** acetaminophen, albuterol, ondansetron (ZOFRAN) IV  ? ?Vital Signs  ?  ?Vitals:  ? 04/19/21 1951 04/19/21 2350 04/20/21 0640 04/20/21 0805  ?BP: 123/64 (!) 108/52  123/70  ?Pulse: 63 63 (!) 51 67  ?Resp: '20 19  15  '$ ?Temp: 98.7 ?F (37.1 ?C) 98.4 ?F (36.9 ?C)  97.7 ?F (36.5 ?C)  ?TempSrc: Oral Oral  Oral  ?SpO2: 98% 92% 93% 94%  ?Weight:   72.5 kg   ?Height:      ? ? ?Intake/Output Summary (Last 24 hours) at 04/20/2021 0017 ?Last data filed at 04/20/2021 4944 ?Gross per 24 hour  ?Intake 710 ml  ?Output 601 ml  ?Net 109 ml  ? ? ?  04/20/2021  ?  6:40 AM 04/19/2021  ?  5:08 AM 04/18/2021  ?  3:29 AM  ?Last 3 Weights  ?Weight (lbs) 159 lb 13.3 oz 154 lb 1.6 oz 156 lb 8.4 oz  ?Weight (kg) 72.5 kg 69.9 kg 71 kg  ?   ? ?Telemetry  ?  ?Sinus rhythm- Personally Reviewed ? ?ECG  ?  ?N/a - Personally Reviewed ? ?Physical Exam  ? ?VS:  BP 123/70 (BP Location: Left Arm)   Pulse 67   Temp 97.7 ?F (36.5 ?C) (Oral)   Resp 15   Ht '5\' 1"'$  (1.549 m)   Wt 72.5 kg   SpO2 94%   BMI 30.20 kg/m?  , BMI Body mass index is 30.2 kg/m?. ?GENERAL:  Well appearing ?HEENT: Pupils equal round and reactive, fundi not visualized, oral mucosa unremarkable ?NECK:  No jugular venous distention, waveform within normal limits, carotid upstroke brisk and symmetric, no bruits, no thyromegaly ?LUNGS: Crackles at the left base ?HEART:  RRR.  PMI not displaced or sustained,S1 and S2 within  normal limits, no S3, no S4, no clicks, no rubs, no murmurs ?ABD:  Flat, positive bowel sounds normal in frequency in pitch, no bruits, no rebound, no guarding, no midline pulsatile mass, no hepatomegaly, no splenomegaly ?EXT:  2 plus pulses throughout, bilateral lymphedema to the mid tibia, no cyanosis no clubbing ?SKIN:  No rashes no nodules ?NEURO:  Cranial nerves II through XII grossly intact, motor grossly intact throughout ?PSYCH:  Cognitively intact, oriented to person place and time ? ? ?Labs  ?  ?High Sensitivity Troponin:  No results for input(s): TROPONINIHS in the last 720 hours.   ?Chemistry ?Recent Labs  ?Lab 04/15/21 ?2015 04/16/21 ?0257 04/16/21 ?0300 04/17/21 ?9675 04/18/21 ?0151 04/18/21 ?1818 04/19/21 ?9163  ?NA 137 140   < > 139 136  --  138  ?K 3.5 3.5   < > 3.3* 2.7* 3.9 3.9  ?CL 108 104  --  101 98  --  97*  ?CO2 24 28  --  29 33*  --  32  ?GLUCOSE 94 100*  --  81 104*  --  94  ?BUN 15  12  --  13 12  --  11  ?CREATININE 0.75 0.91  --  0.79 0.94  --  0.81  ?CALCIUM 8.2* 8.6*  --  8.1* 8.2*  --  8.4*  ?MG  --  1.9  --   --  1.8  --   --   ?PROT 5.8* 6.3*  --   --   --   --   --   ?ALBUMIN 3.1* 3.3*  --   --   --   --   --   ?AST 20 21  --   --   --   --   --   ?ALT 12 14  --   --   --   --   --   ?ALKPHOS 43 46  --   --   --   --   --   ?BILITOT 0.9 1.3*  --   --   --   --   --   ?GFRNONAA >60 >60  --  >60 >60  --  >60  ?ANIONGAP 5 8  --  9 5  --  9  ? < > = values in this interval not displayed.  ?  ?Lipids  ?Recent Labs  ?Lab 04/19/21 ?4259  ?CHOL 115  ?TRIG 85  ?HDL 52  ?[place] 46  ?CHOLHDL 2.2  ?  ?Hematology ?Recent Labs  ?Lab 04/15/21 ?1438 04/16/21 ?0257 04/16/21 ?0300 04/18/21 ?0151  ?WBC 3.9* 4.5  --  4.1  ?RBC 3.88 3.98  --  3.68*  ?HGB 10.7* 11.0* 10.2* 10.2*  ?HCT 35.1* 34.7* 30.0* 31.3*  ?MCV 90.5 87.2  --  85.1  ?MCH 27.6 27.6  --  27.7  ?MCHC 30.5 31.7  --  32.6  ?RDW 15.6* 15.6*  --  15.3  ?PLT 129* 109*  --  119*  ? ?Thyroid  ?Recent Labs  ?Lab 04/17/21 ?0629  ?TSH 2.357  ?   ?BNP ?Recent Labs  ?Lab 04/16/21 ?0258  ?BNP 303.8*  ?  ?DDimer No results for input(s): DDIMER in the last 168 hours.  ? ?Radiology  ?  ?No results found. ? ?Cardiac Studies  ? ?Echo 04/17/21: ?1. Left ventricular ejection fraction, by estimation, is 65 to 70%. The  ?left ventricle has normal function. The left ventricle has no regional  ?wall motion abnormalities. There is mild left ventricular hypertrophy.  ?Left ventricular diastolic parameters  ?are consistent with Grade I diastolic dysfunction (impaired relaxation).  ? 2. Right ventricular systolic function is normal. The right ventricular  ?size is normal. Tricuspid regurgitation signal is inadequate for assessing  ?PA pressure.  ? 3. The mitral valve is grossly normal. Trivial mitral valve  ?regurgitation.  ? 4. The aortic valve is tricuspid. Aortic valve regurgitation is not  ?visualized.  ? 5. The inferior vena cava is dilated in size with >50% respiratory  ?variability, suggesting right atrial pressure of 8 mmHg.  ? ?LE Venous Doppler 04/15/21: ?No DVT ? ?Patient Profile  ?   ?76 y.o. female with recurrent CVA, HTN, CAD s/p PCI, admitted with encephalopathy and acute on chronic diastolic heart failure. ? ?Assessment & Plan  ?  ?# Acute on chronic diastolic heart failure: ?# Lymphedema: ?Ins and outs are at incompletely recorded due to urine loss.  Her weight went up.  Overall she thinks that she was successful in having a lot of urine output yesterday.  Her legs are much better.  Renal function is stable.  We will give her 1  more day of diuresis with IV Lasix.  We will increase the dose to 60 mg IV twice daily.  At home she was on 40 mg daily.  Plan to transition to 60 mg p.o. twice daily tomorrow.  If she continues to have a negative urine output and weight decreases, likely discharge home on Friday.  She will need lymphedema clinic on discharge.  No DVT on Dopplers this admission. ? ?# HTN: ?Blood pressures stable.  Her home lisinopril has been held to  allow blood pressure room for diuresis. ? ?# CVA:  ?# CAD:  ?Continue aspirin and metoprolol.  LDL 46 this admission. ?   ? ?For questions or updates, please contact [place] ?Please consult [email or web address] for contact info under  ? ?  ?   ?Signed, ?Skeet Latch, MD  ?04/20/2021, 9:22 AM   ? ?

## 2021-04-21 ENCOUNTER — Encounter (HOSPITAL_COMMUNITY): Payer: Self-pay | Admitting: Internal Medicine

## 2021-04-21 LAB — BASIC METABOLIC PANEL
Anion gap: 10 (ref 5–15)
BUN: 19 mg/dL (ref 8–23)
CO2: 33 mmol/L — ABNORMAL HIGH (ref 22–32)
Calcium: 8.8 mg/dL — ABNORMAL LOW (ref 8.9–10.3)
Chloride: 94 mmol/L — ABNORMAL LOW (ref 98–111)
Creatinine, Ser: 0.89 mg/dL (ref 0.44–1.00)
GFR, Estimated: >60 mL/min (ref >60)
Glucose, Bld: 97 mg/dL (ref 70–99)
Potassium: 3.6 mmol/L (ref 3.5–5.1)
Sodium: 137 mmol/L (ref 135–145)

## 2021-04-21 LAB — CBC
HCT: 35.1 % — ABNORMAL LOW (ref 36.0–46.0)
Hemoglobin: 11.5 g/dL — ABNORMAL LOW (ref 12.0–15.0)
MCH: 27.9 pg (ref 26.0–34.0)
MCHC: 32.8 g/dL (ref 30.0–36.0)
MCV: 85.2 fL (ref 80.0–100.0)
Platelets: 151 10*3/uL (ref 150–400)
RBC: 4.12 MIL/uL (ref 3.87–5.11)
RDW: 15.7 % — ABNORMAL HIGH (ref 11.5–15.5)
WBC: 5.1 10*3/uL (ref 4.0–10.5)
nRBC: 0 % (ref 0.0–0.2)

## 2021-04-21 NOTE — Progress Notes (Signed)
?PROGRESS NOTE ? ? ? ?Jeanne Lawson  KYH:[id number] DOB: 1945-09-22 DOA: 04/15/2021 ?PCP: [name], MD  ?Narrative 75/F with chronic diastolic CHF, chronic severe lymphedema, COPD, hypertension presented with worsening swelling and discomfort in her legs X 4 to 5 days with difficulty ambulating, Dopplers were negative for DVT, she  was started on diuretics ? ? ?Subjective: ?-Feels better, pain in her legs improving, swelling continues to go down ? ?Assessment & Plan: ? ?* Acute on chronic diastolic CHF (congestive heart failure) ([place]) ?-Presented with progressively worsening acute on chronic edema of bilateral LE, predominantly right heart failure ?-Diuresed with IV Lasix, she is 4.8 L negative ?-Transition to p.o. Lasix today ?-Echo noted EF of 60-65%, mild LVH, grade 1 DD ?-Will refer to lymphedema clinic after discharge ?-Dopplers negative for DVT ?-Discharge planning, home tomorrow ? ?Chronic lymphedema ?-See discussion above ? ?Acute encephalopathy ?- Patient had reported 1 day of progressive confusion on admission, currently resolved  ?-ABG with no hypercarbia, metabolic panel within normal limits, no UTI ?-CT head with no acute intracranial abnormality, moderate to severe small vessel disease. ?-Resolved ? ?COPD (chronic obstructive pulmonary disease) ([place]) ?-Has 30-pack-year history, currently not wheezing  ?-Continue albuterol as needed  ? ?Hypertension ?- Continue IV Lasix, BP stable ?  ? DVT prophylaxis: Lovenox ?Code Status: Full code ?Family Communication: Discussed with patient in detail, no family at bedside ?Disposition Plan: Home tomorrow ? ?Consultants:  ?Cardiology ? ?Procedures:  ? ?Antimicrobials:  ? ? ?Objective: ?Vitals:  ? 04/20/21 2009 04/21/21 0022 04/21/21 1517 04/21/21 0756  ?BP: (!) 111/59 (!) 108/58 127/68 (!) 128/56  ?Pulse: 68 66 65 67  ?Resp: 20 (!) '21 19 15  '$ ?Temp: 98.1 ?F (36.7 ?C) 98.8 ?F (37.1 ?C) (!) 97.4 ?F (36.3 ?C) 98.4 ?F (36.9 ?C)  ?TempSrc: Oral Oral Oral Oral   ?SpO2: 93% 96% 94% 99%  ?Weight:      ?Height:      ? ? ?Intake/Output Summary (Last 24 hours) at 04/21/2021 1121 ?Last data filed at 04/20/2021 2040 ?Gross per 24 hour  ?Intake --  ?Output 1300 ml  ?Net -1300 ml  ? ?Filed Weights  ? 04/18/21 0329 04/19/21 0508 04/20/21 0640  ?Weight: 71 kg 69.9 kg 72.5 kg  ? ? ?Examination: ? ?General exam: Pleasant chronically ill female sitting up in bed, AAOx3, no distress ?HEENT: No JVD ?CVS: S1-S2, regular rate rhythm ?Lungs: Improved air movement, lungs are clear ?Abdomen: Soft, nontender, bowel sounds present  ?Extremities: Bilateral lymphedema with chronic skin changes ?Psychiatry: Judgement and insight appear normal. Mood & affect appropriate.  ? ? ? ?Data Reviewed:  ? ?CBC: ?Recent Labs  ?Lab 04/15/21 ?1438 04/16/21 ?0257 04/16/21 ?0300 04/18/21 ?0151 04/21/21 ?0121  ?WBC 3.9* 4.5  --  4.1 5.1  ?NEUTROABS 2.3 3.1  --   --   --   ?HGB 10.7* 11.0* 10.2* 10.2* 11.5*  ?HCT 35.1* 34.7* 30.0* 31.3* 35.1*  ?MCV 90.5 87.2  --  85.1 85.2  ?PLT 129* 109*  --  119* 151  ? ?Basic Metabolic Panel: ?Recent Labs  ?Lab 04/16/21 ?0257 04/16/21 ?0300 04/17/21 ?0629 04/18/21 ?0151 04/18/21 ?1818 04/19/21 ?6160 04/21/21 ?0121  ?NA 140 140 139 136  --  138 137  ?K 3.5 3.4* 3.3* 2.7* 3.9 3.9 3.6  ?CL 104  --  101 98  --  97* 94*  ?CO2 28  --  29 33*  --  32 33*  ?GLUCOSE 100*  --  81 104*  --  94 97  ?  BUN 12  --  13 12  --  11 19  ?CREATININE 0.91  --  0.79 0.94  --  0.81 0.89  ?CALCIUM 8.6*  --  8.1* 8.2*  --  8.4* 8.8*  ?MG 1.9  --   --  1.8  --   --   --   ? ?GFR: ?Estimated Creatinine Clearance: 48.2 mL/min (by C-G formula based on SCr of 0.89 mg/dL). ?Liver Function Tests: ?Recent Labs  ?Lab 04/15/21 ?2015 04/16/21 ?0257  ?AST 20 21  ?ALT 12 14  ?ALKPHOS 43 46  ?BILITOT 0.9 1.3*  ?PROT 5.8* 6.3*  ?ALBUMIN 3.1* 3.3*  ? ?No results for input(s): LIPASE, AMYLASE in the last 168 hours. ?Recent Labs  ?Lab 04/15/21 ?2030  ?AMMONIA 26  ? ?Coagulation Profile: ?No results for input(s): INR,  PROTIME in the last 168 hours. ?Cardiac Enzymes: ?Recent Labs  ?Lab 04/15/21 ?1438  ?CKTOTAL 203  ? ?BNP (last 3 results) ?No results for input(s): PROBNP in the last 8760 hours. ?HbA1C: ?No results for input(s): HGBA1C in the last 72 hours. ?CBG: ?Recent Labs  ?Lab 04/15/21 ?2347  ?GLUCAP 116*  ? ?Lipid Profile: ?Recent Labs  ?  04/19/21 ?4081  ?CHOL 115  ?HDL 52  ?[place] 46  ?TRIG 85  ?CHOLHDL 2.2  ? ?Thyroid Function Tests: ?No results for input(s): TSH, T4TOTAL, FREET4, T3FREE, THYROIDAB in the last 72 hours. ?Anemia Panel: ?No results for input(s): VITAMINB12, FOLATE, FERRITIN, TIBC, IRON, RETICCTPCT in the last 72 hours. ?Urine analysis: ?   ?Component Value Date/Time  ? COLORURINE COLORLESS (A) 04/16/2021 0115  ? APPEARANCEUR CLEAR 04/16/2021 0115  ? LABSPEC 1.005 04/16/2021 0115  ? PHURINE 7.0 04/16/2021 0115  ? [place] NEGATIVE 04/16/2021 0115  ? HGBUR SMALL (A) 04/16/2021 0115  ? [place] NEGATIVE 04/16/2021 0115  ? [place] NEGATIVE 04/16/2021 0115  ? [place] NEGATIVE 04/16/2021 0115  ? UROBILINOGEN 1.0 11/13/2014 1006  ? NITRITE NEGATIVE 04/16/2021 0115  ? LEUKOCYTESUR NEGATIVE 04/16/2021 0115  ? ?Sepsis Labs: ?'@LABRCNTIP'$ (procalcitonin:4,lacticidven:4) ? ?)No results found for this or any previous visit (from the past 240 hour(s)).  ? ?Radiology Studies: ?No results found. ? ? ?Scheduled Meds: ? aspirin EC  81 mg Oral Q lunch  ? enoxaparin (LOVENOX) injection  40 mg Subcutaneous Daily  ? furosemide  60 mg Oral BID  ? metoprolol succinate  25 mg Oral BID  ? potassium chloride  40 mEq Oral Daily  ? ?Continuous Infusions: ? ? LOS: 6 days  ? ? ?Time spent: 54mn ? ? ?PDomenic Polite MD ?[facility] ? ? ?04/21/2021, 11:21 AM  ?  ?

## 2021-04-21 NOTE — Progress Notes (Signed)
? ?Progress Note ? ?Patient Name: Jeanne Lawson ?Date of Encounter: 04/21/2021 ? ?[facility] Cardiologist: Freada Bergeron, MD  ? ?Subjective  ? ?Feeling better.  Ambulated without difficulty. ? ?Inpatient Medications  ?  ?Scheduled Meds: ? aspirin EC  81 mg Oral Q lunch  ? enoxaparin (LOVENOX) injection  40 mg Subcutaneous Daily  ? furosemide  60 mg Oral BID  ? metoprolol succinate  25 mg Oral BID  ? potassium chloride  40 mEq Oral Daily  ? ?Continuous Infusions: ? ?PRN Meds: ?acetaminophen **OR** acetaminophen, albuterol, ondansetron (ZOFRAN) IV  ? ?Vital Signs  ?  ?Vitals:  ? 04/20/21 2009 04/21/21 0022 04/21/21 9509 04/21/21 0756  ?BP: (!) 111/59 (!) 108/58 127/68 (!) 128/56  ?Pulse: 68 66 65 67  ?Resp: 20 (!) '21 19 15  '$ ?Temp: 98.1 ?F (36.7 ?C) 98.8 ?F (37.1 ?C) (!) 97.4 ?F (36.3 ?C) 98.4 ?F (36.9 ?C)  ?TempSrc: Oral Oral Oral Oral  ?SpO2: 93% 96% 94% 99%  ?Weight:      ?Height:      ? ? ?Intake/Output Summary (Last 24 hours) at 04/21/2021 0827 ?Last data filed at 04/20/2021 2040 ?Gross per 24 hour  ?Intake --  ?Output 1300 ml  ?Net -1300 ml  ? ? ?  04/20/2021  ?  6:40 AM 04/19/2021  ?  5:08 AM 04/18/2021  ?  3:29 AM  ?Last 3 Weights  ?Weight (lbs) 159 lb 13.3 oz 154 lb 1.6 oz 156 lb 8.4 oz  ?Weight (kg) 72.5 kg 69.9 kg 71 kg  ?   ? ?Telemetry  ?  ?Sinus rhythm- Personally Reviewed ? ?ECG  ?  ?N/a - Personally Reviewed ? ?Physical Exam  ? ?VS:  BP (!) 128/56 (BP Location: Left Arm)   Pulse 67   Temp 98.4 ?F (36.9 ?C) (Oral)   Resp 15   Ht '5\' 1"'$  (1.549 m)   Wt 72.5 kg   SpO2 99%   BMI 30.20 kg/m?  , BMI Body mass index is 30.2 kg/m?. ?GENERAL:  Well appearing ?HEENT: Pupils equal round and reactive, fundi not visualized, oral mucosa unremarkable ?NECK:  No jugular venous distention, waveform within normal limits, carotid upstroke brisk and symmetric, no bruits, no thyromegaly ?LUNGS: Crackles at the left base ?HEART:  RRR.  PMI not displaced or sustained,S1 and S2 within normal limits, no S3, no S4, no  clicks, no rubs, no murmurs ?ABD:  Flat, positive bowel sounds normal in frequency in pitch, no bruits, no rebound, no guarding, no midline pulsatile mass, no hepatomegaly, no splenomegaly ?EXT:  2 plus pulses throughout, lymphedema with lichenification.  Significantly improved.no cyanosis no clubbing ?SKIN:  L chronic stasis dermatitis and licehnification ?NEURO:  Cranial nerves II through XII grossly intact, motor grossly intact throughout ?PSYCH:  Cognitively intact, oriented to person place and time ? ? ?Labs  ?  ?High Sensitivity Troponin:  No results for input(s): TROPONINIHS in the last 720 hours.   ?Chemistry ?Recent Labs  ?Lab 04/15/21 ?2015 04/16/21 ?0257 04/16/21 ?0300 04/18/21 ?0151 04/18/21 ?1818 04/19/21 ?3267 04/21/21 ?0121  ?NA 137 140   < > 136  --  138 137  ?K 3.5 3.5   < > 2.7* 3.9 3.9 3.6  ?CL 108 104   < > 98  --  97* 94*  ?CO2 24 28   < > 33*  --  32 33*  ?GLUCOSE 94 100*   < > 104*  --  94 97  ?BUN 15 12   < >  12  --  11 19  ?CREATININE 0.75 0.91   < > 0.94  --  0.81 0.89  ?CALCIUM 8.2* 8.6*   < > 8.2*  --  8.4* 8.8*  ?MG  --  1.9  --  1.8  --   --   --   ?PROT 5.8* 6.3*  --   --   --   --   --   ?ALBUMIN 3.1* 3.3*  --   --   --   --   --   ?AST 20 21  --   --   --   --   --   ?ALT 12 14  --   --   --   --   --   ?ALKPHOS 43 46  --   --   --   --   --   ?BILITOT 0.9 1.3*  --   --   --   --   --   ?GFRNONAA >60 >60   < > >60  --  >60 >60  ?ANIONGAP 5 8   < > 5  --  9 10  ? < > = values in this interval not displayed.  ?  ?Lipids  ?Recent Labs  ?Lab 04/19/21 ?3474  ?CHOL 115  ?TRIG 85  ?HDL 52  ?[place] 46  ?CHOLHDL 2.2  ?  ?Hematology ?Recent Labs  ?Lab 04/16/21 ?2595 04/16/21 ?0300 04/18/21 ?0151 04/21/21 ?0121  ?WBC 4.5  --  4.1 5.1  ?RBC 3.98  --  3.68* 4.12  ?HGB 11.0* 10.2* 10.2* 11.5*  ?HCT 34.7* 30.0* 31.3* 35.1*  ?MCV 87.2  --  85.1 85.2  ?MCH 27.6  --  27.7 27.9  ?MCHC 31.7  --  32.6 32.8  ?RDW 15.6*  --  15.3 15.7*  ?PLT 109*  --  119* 151  ? ?Thyroid  ?Recent Labs  ?Lab  04/17/21 ?0629  ?TSH 2.357  ?  ?BNP ?Recent Labs  ?Lab 04/16/21 ?0258  ?BNP 303.8*  ?  ?DDimer No results for input(s): DDIMER in the last 168 hours.  ? ?Radiology  ?  ?No results found. ? ?Cardiac Studies  ? ?Echo 04/17/21: ?1. Left ventricular ejection fraction, by estimation, is 65 to 70%. The  ?left ventricle has normal function. The left ventricle has no regional  ?wall motion abnormalities. There is mild left ventricular hypertrophy.  ?Left ventricular diastolic parameters  ?are consistent with Grade I diastolic dysfunction (impaired relaxation).  ? 2. Right ventricular systolic function is normal. The right ventricular  ?size is normal. Tricuspid regurgitation signal is inadequate for assessing  ?PA pressure.  ? 3. The mitral valve is grossly normal. Trivial mitral valve  ?regurgitation.  ? 4. The aortic valve is tricuspid. Aortic valve regurgitation is not  ?visualized.  ? 5. The inferior vena cava is dilated in size with >50% respiratory  ?variability, suggesting right atrial pressure of 8 mmHg.  ? ?LE Venous Doppler 04/15/21: ?No DVT ? ?Patient Profile  ?   ?76 y.o. female with recurrent CVA, HTN, CAD s/p PCI, admitted with encephalopathy and acute on chronic diastolic heart failure. ? ?Assessment & Plan  ?  ?# Acute on chronic diastolic heart failure: ?# Lymphedema: ?After increasing Lasix yesterday she was net -1 L in the last 24 hours.  Renal function remained stable.  She will transition to 60 mg p.o. twice daily today.  If renal function remained stable and she maintains a negative fluid balance, I think she would be ready  to go tomorrow with plans for outpatient lymphedema clinic follow-up.  No DVT on Dopplers this admission. ? ?# HTN: ?Blood pressures stable.  Her home lisinopril has been held to allow blood pressure room for diuresis.  Continue metoprolol. ? ?# CVA:  ?# CAD:  ?Continue aspirin and metoprolol.  LDL 46 this admission. ?   ? ?For questions or updates, please contact [facility] ?Please consult [email or web address] for contact info under  ? ?  ?   ?Signed, ?Skeet Latch, MD  ?04/21/2021, 8:27 AM   ? ?

## 2021-04-21 NOTE — Progress Notes (Signed)
Physical Therapy Treatment ?Patient Details ?Name: Jeanne Lawson ?MRN: [id number] ?DOB: [birth year] ?Today's Date: 04/21/2021 ? ? ?History of Present Illness 76 y.o. F who presents 04/15/2021 with worsening of her chronic edema. Admitted with acute on chronic diastolic CHF. Significant PMH: chronic diastolic CHF, chronic edema bilateral lower extremities, COPD, HTN. ? ?  ?PT Comments  ? ? Pt received seated EOB finishing lunch, agreeable to therapy session and with good participation and tolerance for gait progression with RW and standing exercises. Pt able to perform step-up with up to light minA and needing Supervision at most for gait with RW. Pt SpO2 with poor signal while holding RW handles but not dyspneic and VSS on RA at rest per monitor. Pt making good progress toward goals, will need goal update next session as she has met transfer and gait goals, will plan to work on bed mobility, pt c/o BLE feeling "heavy" still and not able to get OOB unassisted. Pt continues to benefit from PT services to progress toward functional mobility goals.    ?Recommendations for follow up therapy are one component of a multi-disciplinary discharge planning process, led by the attending physician.  Recommendations may be updated based on patient status, additional functional criteria and insurance authorization. ? ?Follow Up Recommendations ? Outpatient PT (pt preference for [facility] on [address]) ?  ?  ?Assistance Recommended at Discharge PRN  ?Patient can return home with the following Assist for transportation;Help with stairs or ramp for entrance ?  ?Equipment Recommendations ? None recommended by PT  ?  ?Recommendations for Other Services   ? ? ?  ?Precautions / Restrictions Precautions ?Precautions: Fall ?Restrictions ?Weight Bearing Restrictions: No  ?  ? ?Mobility ? Bed Mobility ?  ?  ?  ?  ?  ?  ?  ?General bed mobility comments: sitting on EOB upon entry and using toilet at end of session. ?  ? ?Transfers ?Overall transfer  level: Needs assistance ?Equipment used: Rolling walker (2 wheels) ?Transfers: Sit to/from Stand ?Sit to Stand: Supervision ?  ?  ?  ?  ?  ?General transfer comment: from EOB>RW and to toilet using wall rail ?  ? ?Ambulation/Gait ?Ambulation/Gait assistance: Supervision ?Gait Distance (Feet): 170 Feet ?Assistive device: Rolling walker (2 wheels) ?Gait Pattern/deviations: Decreased stride length, Step-through pattern, Decreased dorsiflexion - left, Decreased step length - right ?  ?  ?  ?General Gait Details: Cues for walker proximity, pt with decreased bilateral foot clearance and decreased B heel strike, HR WFL, SpO2 sensor not reading during gait but pt not dyspneic ? ? ?Stairs ?Stairs: Yes ?Stairs assistance: Min assist ?Stair Management: With walker, Forwards, Step to pattern ?Number of Stairs: 1 ?General stair comments: pt performed foot taps up onto step x5 reps ea bilaterally, able to step up onto step x1 reps leading with LLE and down with painful RLE, no LOB, minA for RW stability and steadying, no buckling ? ? ?Wheelchair Mobility ?  ? ?Modified Rankin (Stroke Patients Only) ?  ? ? ?  ?Balance Overall balance assessment: Needs assistance ?Sitting-balance support: Feet supported ?Sitting balance-Leahy Scale: Good ?  ?  ?Standing balance support: During functional activity, Reliant on assistive device for balance ?Standing balance-Leahy Scale: Poor ?Standing balance comment: static standing briefly unsupported no LOB, needs BUE support of RW for dynamic tasks ?  ?  ?  ?  ?  ?  ?  ?  ?  ?  ?  ?  ? ?  ?Cognition Arousal/Alertness:  Awake/alert ?Behavior During Therapy: [facility] for tasks assessed/performed ?Overall Cognitive Status: Impaired/Different from baseline ?Area of Impairment: Memory, Problem solving ?  ?  ?  ?  ?  ?  ?  ?  ?  ?  ?Memory: Decreased short-term memory ?  ?  ?  ?Problem Solving: Slow processing, Difficulty sequencing, Requires verbal cues ?General Comments: able recall distance ambulated last  session, alert and cooperative ?  ?  ? ?  ?Exercises Other Exercises ?Other Exercises: standing BLE AROM: hip flexion with foot tap on 7" step x5 reps ea ?Other Exercises: STS x2 reps ?Other Exercises: step up/down x1 rep for BLE strengthening ? ?  ?General Comments General comments (skin integrity, edema, etc.): SpO2 poor signal during gait trial, SpO2 93% on RA at rest, no dyspnea on exertion; HR WFL throughout, no dizziness or lightheadedness reported. ?  ?  ? ?Pertinent Vitals/Pain Pain Assessment ?Pain Assessment: 0-10 ?Pain Score: 3  ?Pain Location: BLE's ?Pain Descriptors / Indicators: Tender, Tingling ?Pain Intervention(s): Monitored during session, Repositioned  ? ? ? ?PT Goals (current goals can now be found in the care plan section) Acute Rehab PT Goals ?Patient Stated Goal: walk more ?PT Goal Formulation: With patient ?Time For Goal Achievement: 05/01/21 ?Progress towards PT goals: Progressing toward goals ? ?  ?Frequency ? ? ? Min 3X/week ? ? ? ?  ?PT Plan Current plan remains appropriate  ? ? ?   ?AM-PAC PT "6 Clicks" Mobility   ?Outcome Measure ? Help needed turning from your back to your side while in a flat bed without using bedrails?: A Little ?Help needed moving from lying on your back to sitting on the side of a flat bed without using bedrails?: A Lot ?Help needed moving to and from a bed to a chair (including a wheelchair)?: A Little ?Help needed standing up from a chair using your arms (e.g., wheelchair or bedside chair)?: A Little ?Help needed to walk in hospital room?: A Little ?Help needed climbing 3-5 steps with a railing? : A Little ?6 Click Score: 17 ? ?  ?End of Session Equipment Utilized During Treatment: Gait belt ?Activity Tolerance: Patient tolerated treatment well ?Patient left: with call bell/phone within reach;Other (comment) (pt in bathroom on toilet, NT outside room notified, pt aware of wall call bell, pt requesting increased time on toilet) ?Nurse Communication: Mobility  status;Other (comment) (pt needing to have BM wanting time in bathroom) ?PT Visit Diagnosis: Unsteadiness on feet (R26.81);Other abnormalities of gait and mobility (R26.89);Difficulty in walking, not elsewhere classified (R26.2) ?  ? ? ?Time: 1202-1238 ?PT Time Calculation (min) (ACUTE ONLY): 36 min ? ?Charges:  $Gait Training: 8-22 mins ?$Therapeutic Exercise: 8-22 mins          ?          ? ?Kalden Wanke P., PTA ?Acute Rehabilitation Services ?Secure Chat Preferred 9a-5:30pm ?Office: [phone]  ? ? ?Kara Pacer Kathan Kirker ?04/21/2021, 1:04 PM ? ?

## 2021-04-21 NOTE — Plan of Care (Signed)

## 2021-04-21 NOTE — Progress Notes (Signed)
Occupational Therapy Treatment ?Patient Details ?Name: Jeanne Lawson ?MRN: [id number] ?DOB: 07/21/1945 ?Today's Date: 04/21/2021 ? ? ?History of present illness 76 y.o. F who presents 04/15/2021 with worsening of her chronic edema. Admitted with acute on chronic diastolic CHF. Significant PMH: chronic diastolic CHF, chronic edema bilateral lower extremities, COPD, HTN. ?  ?OT comments ? Patient making good progress with OT treatment with min guard to ambulate to bathroom supervision for hygiene. Patient continues to require max assist to donn footwear due to edema and pain.  Patient declined sitting in recliner due to wanted to have feet elevated in bed. Acute OT to continue to follow.   ? ?Recommendations for follow up therapy are one component of a multi-disciplinary discharge planning process, led by the attending physician.  Recommendations may be updated based on patient status, additional functional criteria and insurance authorization. ?   ?Follow Up Recommendations ? No OT follow up  ?  ?Assistance Recommended at Discharge Intermittent Supervision/Assistance  ?Patient can return home with the following ? A little help with walking and/or transfers;A lot of help with bathing/dressing/bathroom;Assistance with cooking/housework ?  ?Equipment Recommendations ? None recommended by OT  ?  ?Recommendations for Other Services   ? ?  ?Precautions / Restrictions Precautions ?Precautions: Fall ?Restrictions ?Weight Bearing Restrictions: No  ? ? ?  ? ?Mobility Bed Mobility ?Overal bed mobility: Needs Assistance ?Bed Mobility: Sit to Supine ?  ?  ?  ?Sit to supine: Mod assist ?  ?General bed mobility comments: sitting on EOB upon entry. Required assistance with BLE to get back to supine ?  ? ?Transfers ?Overall transfer level: Needs assistance ?Equipment used: Rolling walker (2 wheels) ?Transfers: Sit to/from Stand ?Sit to Stand: Supervision ?  ?  ?  ?  ?  ?General transfer comment: min guard for safety ?  ?  ?Balance  Overall balance assessment: Needs assistance ?Sitting-balance support: Feet supported ?Sitting balance-Leahy Scale: Good ?  ?  ?Standing balance support: Single extremity supported, During functional activity ?Standing balance-Leahy Scale: Poor ?Standing balance comment: reliant on external support forbalance with single extremity ?  ?  ?  ?  ?  ?  ?  ?  ?  ?  ?  ?   ? ?ADL either performed or assessed with clinical judgement  ? ?ADL Overall ADL's : Needs assistance/impaired ?  ?  ?  ?  ?  ?  ?  ?  ?Upper Body Dressing : Set up;Supervision/safety;Sitting ?Upper Body Dressing Details (indicate cue type and reason): donned gown to cover back ?Lower Body Dressing: Moderate assistance;Sit to/from stand ?Lower Body Dressing Details (indicate cue type and reason): Max A to don socks ?Toilet Transfer: Ambulation;Regular Toilet;Rolling walker (2 wheels);Min guard ?Toilet Transfer Details (indicate cue type and reason): ambulated to bathroom with RW ?Toileting- Clothing Manipulation and Hygiene: Supervision/safety;Sitting/lateral lean ?  ?  ?  ?  ?General ADL Comments: increased time for mobility ?  ? ?Extremity/Trunk Assessment   ?  ?  ?  ?  ?  ? ?Vision   ?  ?  ?Perception   ?  ?Praxis   ?  ? ?Cognition Arousal/Alertness: Awake/alert ?Behavior During Therapy: [facility] for tasks assessed/performed ?Overall Cognitive Status: Impaired/Different from baseline ?Area of Impairment: Memory, Problem solving ?  ?  ?  ?  ?  ?  ?  ?  ?  ?  ?Memory: Decreased short-term memory ?  ?  ?  ?Problem Solving: Slow processing, Difficulty sequencing, Requires verbal cues ?General Comments:  able recall distance ambulated last session.  Cues for safety ?  ?  ?   ?Exercises   ? ?  ?Shoulder Instructions   ? ? ?  ?General Comments    ? ? ?Pertinent Vitals/ Pain       Pain Assessment ?Pain Assessment: Faces ?Faces Pain Scale: Hurts a little bit ?Pain Location: BLE's ?Pain Descriptors / Indicators: Tender ?Pain Intervention(s): Monitored during session,  Repositioned ? ?Home Living   ?  ?  ?  ?  ?  ?  ?  ?  ?  ?  ?  ?  ?  ?  ?  ?  ?  ?  ? ?  ?Prior Functioning/Environment    ?  ?  ?  ?   ? ?Frequency ? Min 2X/week  ? ? ? ? ?  ?Progress Toward Goals ? ?OT Goals(current goals can now be found in the care plan section) ? Progress towards OT goals: Progressing toward goals ? ?Acute Rehab OT Goals ?Patient Stated Goal: get better ?OT Goal Formulation: With patient ?Time For Goal Achievement: 05/02/21 ?Potential to Achieve Goals: Good ?ADL Goals ?Pt Will Perform Grooming: with modified independence;standing ?Pt Will Perform Upper Body Dressing: with modified independence;sitting;standing ?Pt Will Perform Lower Body Dressing: with modified independence;sit to/from stand;with adaptive equipment;with caregiver independent in assisting ?Pt Will Transfer to Toilet: with modified independence;ambulating;regular height toilet ?Pt Will Perform Toileting - Clothing Manipulation and hygiene: with modified independence;sit to/from stand;sitting/lateral leans  ?Plan Discharge plan remains appropriate   ? ?Co-evaluation ? ? ?   ?  ?  ?  ?  ? ?  ?AM-PAC OT "6 Clicks" Daily Activity     ?Outcome Measure ? ? Help from another person eating meals?: None ?Help from another person taking care of personal grooming?: A Little ?Help from another person toileting, which includes using toliet, bedpan, or urinal?: A Little ?Help from another person bathing (including washing, rinsing, drying)?: A Little ?Help from another person to put on and taking off regular upper body clothing?: A Little ?Help from another person to put on and taking off regular lower body clothing?: A Lot ?6 Click Score: 18 ? ?  ?End of Session Equipment Utilized During Treatment: Rolling walker (2 wheels) ? ?OT Visit Diagnosis: Unsteadiness on feet (R26.81);Other abnormalities of gait and mobility (R26.89);Muscle weakness (generalized) (M62.81) ?  ?Activity Tolerance Patient tolerated treatment well ?  ?Patient Left in  bed;with call bell/phone within reach ?  ?Nurse Communication Mobility status ?  ? ?   ? ?Time: 3212-2482 ?OT Time Calculation (min): 32 min ? ?Charges: OT General Charges ?$OT Visit: 1 Visit ?OT Treatments ?$Self Care/Home Management : 8-22 mins ?$Therapeutic Activity: 8-22 mins ? ?Lodema Hong, OTA ?Acute Rehabilitation Services  ?Pager [phone] ?Office [phone] ? ? ?Trixie Dredge ?04/21/2021, 10:43 AM ?

## 2021-04-21 NOTE — Progress Notes (Signed)
Pt declined HV TOC clinic appt upon DC from hospitalization. Pt educated on benefits of HV TOC. Pt education complete regarding HF .  ?Patient states she [place] a [place] after discharge and then will see her cardiologist Dr. Johney Frame on 05/17/21. ? ?Earnestine Leys, BSN, RN ?Heart Failure Nurse Navigator ?[phone]  ?

## 2021-04-22 ENCOUNTER — Other Ambulatory Visit (HOSPITAL_COMMUNITY): Payer: Self-pay

## 2021-04-22 LAB — BASIC METABOLIC PANEL
Anion gap: 10 (ref 5–15)
BUN: 22 mg/dL (ref 8–23)
CO2: 31 mmol/L (ref 22–32)
Calcium: 8.5 mg/dL — ABNORMAL LOW (ref 8.9–10.3)
Chloride: 96 mmol/L — ABNORMAL LOW (ref 98–111)
Creatinine, Ser: 1.01 mg/dL — ABNORMAL HIGH (ref 0.44–1.00)
GFR, Estimated: 57 mL/min — ABNORMAL LOW (ref >60)
Glucose, Bld: 106 mg/dL — ABNORMAL HIGH (ref 70–99)
Potassium: 3.9 mmol/L (ref 3.5–5.1)
Sodium: 137 mmol/L (ref 135–145)

## 2021-04-22 MED ORDER — POTASSIUM CHLORIDE CRYS ER 20 MEQ PO TBCR
40.0000 meq | EXTENDED_RELEASE_TABLET | Freq: Every day | ORAL | 0 refills | Status: AC
  Filled 2021-04-22: qty 60, 30d supply, fill #0

## 2021-04-22 MED ORDER — FUROSEMIDE 40 MG PO TABS
60.0000 mg | ORAL_TABLET | Freq: Every day | ORAL | 0 refills | Status: DC
  Filled 2021-04-22: qty 60, 40d supply, fill #0

## 2021-04-22 MED ORDER — FUROSEMIDE 40 MG PO TABS: 60.0000 mg | ORAL_TABLET | Freq: Every day | ORAL | Status: DC

## 2021-04-22 MED ORDER — FUROSEMIDE 40 MG PO TABS
40.0000 mg | ORAL_TABLET | Freq: Every day | ORAL | Status: DC
Start: 2021-04-23 — End: 2021-04-22

## 2021-04-22 MED ORDER — DEXTROSE 50 % IV SOLN
INTRAVENOUS | Status: AC
  Filled 2021-04-22: qty 50

## 2021-04-22 NOTE — Progress Notes (Signed)
? ?Progress Note ? ?Patient Name: Jeanne Lawson ?Date of Encounter: 04/22/2021 ? ?[facility] Cardiologist: Freada Bergeron, MD  ? ?Subjective  ? ?Feeling better.  Ambulated without difficulty.  Notes that she is starting to get lightheaded when standing. ? ?Inpatient Medications  ?  ?Scheduled Meds: ? aspirin EC  81 mg Oral Q lunch  ? enoxaparin (LOVENOX) injection  40 mg Subcutaneous Daily  ? furosemide  60 mg Oral BID  ? metoprolol succinate  25 mg Oral BID  ? potassium chloride  40 mEq Oral Daily  ? ?Continuous Infusions: ? ?PRN Meds: ?acetaminophen **OR** acetaminophen, albuterol, ondansetron (ZOFRAN) IV  ? ?Vital Signs  ?  ?Vitals:  ? 04/21/21 2311 04/22/21 0334 04/22/21 0500 04/22/21 0740  ?BP: 104/66 (!) 103/45  111/69  ?Pulse: (!) 55 (!) 54  62  ?Resp: 20 19    ?Temp: 97.8 ?F (36.6 ?C) 98.1 ?F (36.7 ?C)  98.7 ?F (37.1 ?C)  ?TempSrc: Oral Oral    ?SpO2: 94% 98%  95%  ?Weight:   66 kg   ?Height:      ? ? ?Intake/Output Summary (Last 24 hours) at 04/22/2021 0828 ?Last data filed at 04/21/2021 0940 ?Gross per 24 hour  ?Intake 240 ml  ?Output --  ?Net 240 ml  ? ? ?  04/22/2021  ?  5:00 AM 04/20/2021  ?  6:40 AM 04/19/2021  ?  5:08 AM  ?Last 3 Weights  ?Weight (lbs) 145 lb 8.1 oz 159 lb 13.3 oz 154 lb 1.6 oz  ?Weight (kg) 66 kg 72.5 kg 69.9 kg  ?   ? ?Telemetry  ?  ?Sinus rhythm- Personally Reviewed ? ?ECG  ?  ?N/a - Personally Reviewed ? ?Physical Exam  ? ?VS:  BP 111/69 (BP Location: Left Arm)   Pulse 62   Temp 98.7 ?F (37.1 ?C)   Resp 19   Ht '5\' 1"'$  (1.549 m)   Wt 66 kg   SpO2 95%   BMI 27.49 kg/m?  , BMI Body mass index is 27.49 kg/m?. ?GENERAL:  Well appearing ?HEENT: Pupils equal round and reactive, fundi not visualized, oral mucosa unremarkable ?NECK:  No jugular venous distention, waveform within normal limits, carotid upstroke brisk and symmetric, no bruits, no thyromegaly ?LUNGS: Crackles at the left base ?HEART:  RRR.  PMI not displaced or sustained,S1 and S2 within normal limits, no S3, no  S4, no clicks, no rubs, no murmurs ?ABD:  Flat, positive bowel sounds normal in frequency in pitch, no bruits, no rebound, no guarding, no midline pulsatile mass, no hepatomegaly, no splenomegaly ?EXT:  2 plus pulses throughout, lymphedema with lichenification.  Significantly improved.no cyanosis no clubbing ?SKIN:  L chronic stasis dermatitis and licehnification ?NEURO:  Cranial nerves II through XII grossly intact, motor grossly intact throughout ?PSYCH:  Cognitively intact, oriented to person place and time ? ? ?Labs  ?  ?High Sensitivity Troponin:  No results for input(s): TROPONINIHS in the last 720 hours.   ?Chemistry ?Recent Labs  ?Lab 04/15/21 ?2015 04/16/21 ?0257 04/16/21 ?0300 04/18/21 ?0151 04/18/21 ?1818 04/19/21 ?8250 04/21/21 ?0121 04/22/21 ?0370  ?NA 137 140   < > 136  --  138 137 137  ?K 3.5 3.5   < > 2.7*   < > 3.9 3.6 3.9  ?CL 108 104   < > 98  --  97* 94* 96*  ?CO2 24 28   < > 33*  --  32 33* 31  ?GLUCOSE 94 100*   < >  104*  --  94 97 106*  ?BUN 15 12   < > 12  --  '11 19 22  '$ ?CREATININE 0.75 0.91   < > 0.94  --  0.81 0.89 1.01*  ?CALCIUM 8.2* 8.6*   < > 8.2*  --  8.4* 8.8* 8.5*  ?MG  --  1.9  --  1.8  --   --   --   --   ?PROT 5.8* 6.3*  --   --   --   --   --   --   ?ALBUMIN 3.1* 3.3*  --   --   --   --   --   --   ?AST 20 21  --   --   --   --   --   --   ?ALT 12 14  --   --   --   --   --   --   ?ALKPHOS 43 46  --   --   --   --   --   --   ?BILITOT 0.9 1.3*  --   --   --   --   --   --   ?GFRNONAA >60 >60   < > >60  --  >60 >60 57*  ?ANIONGAP 5 8   < > 5  --  '9 10 10  '$ ? < > = values in this interval not displayed.  ?  ?Lipids  ?Recent Labs  ?Lab 04/19/21 ?8563  ?CHOL 115  ?TRIG 85  ?HDL 52  ?[place] 46  ?CHOLHDL 2.2  ?  ?Hematology ?Recent Labs  ?Lab 04/16/21 ?1497 04/16/21 ?0300 04/18/21 ?0151 04/21/21 ?0121  ?WBC 4.5  --  4.1 5.1  ?RBC 3.98  --  3.68* 4.12  ?HGB 11.0* 10.2* 10.2* 11.5*  ?HCT 34.7* 30.0* 31.3* 35.1*  ?MCV 87.2  --  85.1 85.2  ?MCH 27.6  --  27.7 27.9  ?MCHC 31.7  --  32.6  32.8  ?RDW 15.6*  --  15.3 15.7*  ?PLT 109*  --  119* 151  ? ?Thyroid  ?Recent Labs  ?Lab 04/17/21 ?0629  ?TSH 2.357  ?  ?BNP ?Recent Labs  ?Lab 04/16/21 ?0258  ?BNP 303.8*  ?  ?DDimer No results for input(s): DDIMER in the last 168 hours.  ? ?Radiology  ?  ?No results found. ? ?Cardiac Studies  ? ?Echo 04/17/21: ?1. Left ventricular ejection fraction, by estimation, is 65 to 70%. The  ?left ventricle has normal function. The left ventricle has no regional  ?wall motion abnormalities. There is mild left ventricular hypertrophy.  ?Left ventricular diastolic parameters  ?are consistent with Grade I diastolic dysfunction (impaired relaxation).  ? 2. Right ventricular systolic function is normal. The right ventricular  ?size is normal. Tricuspid regurgitation signal is inadequate for assessing  ?PA pressure.  ? 3. The mitral valve is grossly normal. Trivial mitral valve  ?regurgitation.  ? 4. The aortic valve is tricuspid. Aortic valve regurgitation is not  ?visualized.  ? 5. The inferior vena cava is dilated in size with >50% respiratory  ?variability, suggesting right atrial pressure of 8 mmHg.  ? ?LE Venous Doppler 04/15/21: ?No DVT ? ?Patient Profile  ?   ?76 y.o. female with recurrent CVA, HTN, CAD s/p PCI, admitted with encephalopathy and acute on chronic diastolic heart failure. ? ?Assessment & Plan  ?  ?# Acute on chronic diastolic heart failure: ?# Lymphedema: ?Her legs are looking much better today.  She has some oozing on the left lateral leg.  We will ask the wound team to come see her.  Her renal function is starting to climb and she is feeling a little lightheaded.  Recommend switching her Lasix to 60 mg daily to start tomorrow.  Check a BMP in a week.   Her weight today is 66 kg.  This is down from 75.8 kg on admission.  No DVT on Dopplers this admission. ? ?# HTN: ?Blood pressures stable.  Her home lisinopril has been held to allow blood pressure room for diuresis.  Continue metoprolol. ? ?# CVA:  ?# CAD:   ?Continue aspirin and metoprolol.  LDL 46 this admission. ?   ?[facility] will sign off.   ?Medication Recommendations:  Lasix '60mg'$  po daily to start tomorrow ?Other recommendations (labs, testing, etc): BMP in 1 week ?Follow up as an outpatient:  f/u scheduled ? ? ?For questions or updates, please contact [place] ?Please consult [email or web address] for contact info under  ? ?  ?   ?Signed, ?Skeet Latch, MD  ?04/22/2021, 8:28 AM   ? ?

## 2021-04-22 NOTE — Progress Notes (Signed)
Mobility Specialist Progress Note ? ? 04/22/21 1200  ?Mobility  ?Activity Transferred from chair to bed  ?Level of Assistance Minimal assist, patient does 75% or more  ?Assistive Device Front wheel walker  ?Distance Ambulated (ft) 14 ft  ?Activity Response Tolerated well  ?$Mobility charge 1 Mobility  ? ?Pre Mobility: 100/52 BP ?During Mobility: 85 HR ?Post Mobility: 102/84 BP ? ?Received in chair requesting to get back to bed d/t discomfort. Tx'd w/o fault and needing minA to get LE back in bed. Pt having no complaints, left w/ call bell in reach.  ? ?Holland Falling ?Mobility Specialist ?Phone Number [phone] ? ?

## 2021-04-22 NOTE — Progress Notes (Signed)
Physical Therapy Treatment ?Patient Details ?Name: Jeanne Lawson ?MRN: [id number] ?DOB: 06-May-1945 ?Today's Date: 04/22/2021 ? ? ?History of Present Illness 76 y.o. F who presents 04/15/2021 with worsening of her chronic edema. Admitted with acute on chronic diastolic CHF. Significant PMH: chronic diastolic CHF, chronic edema bilateral lower extremities, COPD, HTN. ? ?  ?PT Comments  ? ? Focused session on performing functional tasks without UE support and on progressing gait. Pt able to stand at sink to perform ADLs without UE support, LOB, or assistance. Pt also able to perform all transfers and ambulate with a RW at a SUP level without LOB. Pt still requiring minA for bed mobility, but plans to sleep in a lift chair recliner upon d/c. Will continue to follow acutely. Goals updated. Current plan remains appropriate. ?  ?Recommendations for follow up therapy are one component of a multi-disciplinary discharge planning process, led by the attending physician.  Recommendations may be updated based on patient status, additional functional criteria and insurance authorization. ? ?Follow Up Recommendations ? Outpatient PT (pt preference for [facility] on [address]) ?  ?  ?Assistance Recommended at Discharge PRN  ?Patient can return home with the following Assist for transportation;Help with stairs or ramp for entrance ?  ?Equipment Recommendations ? None recommended by PT  ?  ?Recommendations for Other Services   ? ? ?  ?Precautions / Restrictions Precautions ?Precautions: Fall ?Restrictions ?Weight Bearing Restrictions: No  ?  ? ?Mobility ? Bed Mobility ?Overal bed mobility: Needs Assistance ?Bed Mobility: Supine to Sit ?  ?  ?Supine to sit: Min assist, HOB elevated ?  ?  ?General bed mobility comments: Pt requesting assistance to manage legs off EOB, but educated pt to try on her own as she plans to go home alone. Pt able to bring legs off bed without assistance, but required light minA to ascend her trunk from slightly  elevated HOB ?  ? ?Transfers ?Overall transfer level: Needs assistance ?Equipment used: Rolling walker (2 wheels) ?Transfers: Sit to/from Stand ?Sit to Stand: Supervision ?  ?  ?  ?  ?  ?General transfer comment: Able to come to stand from EOB and commode without LOB or assistance, supervision for safety ?  ? ?Ambulation/Gait ?Ambulation/Gait assistance: Supervision ?Gait Distance (Feet): 262 Feet (x2 bouts of ~15 ft > ~262 ft) ?Assistive device: Rolling walker (2 wheels) ?Gait Pattern/deviations: Decreased stride length, Step-through pattern, Decreased step length - right, Decreased stance time - left, Shuffle ?Gait velocity: reduced ?Gait velocity interpretation: <1.31 ft/sec, indicative of household ambulator ?  ?General Gait Details: Pt with slow, shuffling gait and decreased L stance time and R step length. No LOB though, supervision for safety. Cues to improve feet clearance, with momentary success intermittently ? ? ?Stairs ?  ?  ?  ?  ?  ? ? ?Wheelchair Mobility ?  ? ?Modified Rankin (Stroke Patients Only) ?  ? ? ?  ?Balance Overall balance assessment: Needs assistance ?Sitting-balance support: Feet supported, No upper extremity supported ?Sitting balance-Leahy Scale: Good ?  ?  ?Standing balance support: During functional activity, No upper extremity supported, Bilateral upper extremity supported ?Standing balance-Leahy Scale: Fair ?Standing balance comment: Able to stand at sink and turn body and reach off BOS min-mod without LOB or UE support. Uses RW for mobility ?  ?  ?  ?  ?  ?  ?  ?  ?  ?  ?  ?  ? ?  ?Cognition Arousal/Alertness: Awake/alert ?Behavior During Therapy: [facility]  for tasks assessed/performed ?Overall Cognitive Status: Impaired/Different from baseline ?Area of Impairment: Memory, Problem solving ?  ?  ?  ?  ?  ?  ?  ?  ?  ?  ?Memory: Decreased short-term memory ?  ?  ?  ?Problem Solving: Slow processing, Difficulty sequencing, Requires verbal cues ?General Comments: Pt repeating comments at  time. Needs cues and reminders to try to perform tasks on her own as she is planning to go home alone ?  ?  ? ?  ?Exercises   ? ?  ?General Comments General comments (skin integrity, edema, etc.): VSS on RA ?  ?  ? ?Pertinent Vitals/Pain Pain Assessment ?Pain Assessment: Faces ?Faces Pain Scale: Hurts little more ?Pain Location: BLE's ?Pain Descriptors / Indicators: Tender, Tingling, Discomfort ?Pain Intervention(s): Limited activity within patient's tolerance, Monitored during session, Repositioned  ? ? ?Home Living   ?  ?  ?  ?  ?  ?  ?  ?  ?  ?   ?  ?Prior Function    ?  ?  ?   ? ?PT Goals (current goals can now be found in the care plan section) Acute Rehab PT Goals ?Patient Stated Goal: to go home today ?PT Goal Formulation: With patient ?Time For Goal Achievement: 05/01/21 ?Potential to Achieve Goals: Good ?Progress towards PT goals: Progressing toward goals ? ?  ?Frequency ? ? ? Min 3X/week ? ? ? ?  ?PT Plan Current plan remains appropriate  ? ? ?Co-evaluation   ?  ?  ?  ?  ? ?  ?AM-PAC PT "6 Clicks" Mobility   ?Outcome Measure ? Help needed turning from your back to your side while in a flat bed without using bedrails?: A Little ?Help needed moving from lying on your back to sitting on the side of a flat bed without using bedrails?: A Lot ?Help needed moving to and from a bed to a chair (including a wheelchair)?: A Little ?Help needed standing up from a chair using your arms (e.g., wheelchair or bedside chair)?: A Little ?Help needed to walk in hospital room?: A Little ?Help needed climbing 3-5 steps with a railing? : A Little ?6 Click Score: 17 ? ?  ?End of Session   ?Activity Tolerance: Patient tolerated treatment well ?Patient left: with call bell/phone within reach;in bed;with bed alarm set ?Nurse Communication: Mobility status ?PT Visit Diagnosis: Unsteadiness on feet (R26.81);Other abnormalities of gait and mobility (R26.89);Difficulty in walking, not elsewhere classified (R26.2);Muscle weakness  (generalized) (M62.81) ?  ? ? ?Time: 9528-4132 ?PT Time Calculation (min) (ACUTE ONLY): 36 min ? ?Charges:  $Gait Training: 8-22 mins ?$Therapeutic Activity: 8-22 mins          ?          ? ?Moishe Spice, PT, DPT ?Acute Rehabilitation Services  ?Pager: [phone] ?Office: [phone] ? ? ? ?Maretta Bees Pettis ?04/22/2021, 9:04 AM ? ?

## 2021-04-22 NOTE — Plan of Care (Signed)

## 2021-04-22 NOTE — Consult Note (Signed)
WOC Nurse Consult Note: ?Reason for Consult: Consult requested for left leg wound.  Pt has chronic lymphedema to BLE and states she has a persron who performs lymphedema massage prior to admission.  ?Wound type: Left outer leg with chronic full thickness stasis ulcer; 100% red and moist, small amt yellow drainage, 4X2X.2cm ?Dressing procedure/placement/frequency: Topical treatment orders provided for bedside nurses to perform as follows: Foam dressing to left leg, change Q 3 days or PRN soiling. ?Please re-consult if further assistance is needed.  Thank-you,  ?Julien Girt MSN, RN, [place], [place], CNS ?[phone]  ? ?  ?

## 2021-04-22 NOTE — Progress Notes (Signed)
Mobility Specialist Progress Note ? ? 04/22/21 1100  ?Orthostatic Lying   ?BP- Lying 105/68  ?Pulse- Lying 64  ?Orthostatic Sitting  ?BP- Sitting 92/64  ?Pulse- Sitting 69  ?Orthostatic Standing at 0 minutes  ?BP- Standing at 0 minutes (!) 78/36  ?Pulse- Standing at 0 minutes 79  ?Orthostatic Standing at 3 minutes  ?BP- Standing at 3 minutes (!) 82/69  ?Pulse- Standing at 3 minutes 81  ?Mobility  ?Activity Ambulated with assistance in room;Transferred from bed to chair  ?Level of Assistance Contact guard assist, steadying assist  ?Assistive Device Front wheel walker  ?Distance Ambulated (ft) 14 ft  ?Activity Response Tolerated well  ?$Mobility charge 1 Mobility  ? ?Pre Mobility: 64 HR, 105/68 BP ?During Mobility: 79 HR, 78/36 BP ?Post Mobility: 76 HR ? ?Received pt in bed feeling a little light headed but agreeable to doing orthostatic BP's. After ~18mns lightheadedness subsided. Contact guard through out transitions leading to the chair. Pt's BP's hypotensive throughout but pt presenting w/ no symptoms and denying lightheadedness and dizziness. Left in chair w/ call bell in reach and RN notified about drops in BP. ? ?JHolland Falling?Mobility Specialist ?Phone Number [phone]? ?

## 2021-04-22 NOTE — Progress Notes (Signed)
RN went over d/c summary with pt. NT removing PIV. Belongings w/ pt, including TOC meds. Pt's daughter transporting pt home. NT transporting pt to private vehicle. ?

## 2021-04-22 NOTE — TOC Transition Note (Signed)
Transition of Care (TOC) - CM/SW Discharge Note ? ? ?Patient Details  ?Name: Jeanne Lawson ?MRN: [id number] ?Date of Birth: 04-Nov-1945 ? ?Transition of Care (TOC) CM/SW Contact:  ?Angelita Ingles, RN ?Phone [phone] ? ?04/22/2021, 10:17 AM ? ? ?Clinical Narrative:    ?Ambulatory referral for outpatient PT sent to [facility] [address] per patients request.. No other needs noted at this time. TOC will sign off.  ? ? ?Final next level of care: OP Rehab ?Barriers to Discharge: No Barriers Identified ? ? ?Patient Goals and CMS Choice ?  ?  ?  ? ?Discharge Placement ?  ?           ?  ?  ?  ?  ? ?Discharge Plan and Services ?  ?  ?           ?  ?  ?  ?  ?  ?  ?  ?  ?  ?  ? ?Social Determinants of Health (SDOH) Interventions ?Food Insecurity Interventions: Intervention Not Indicated ?Financial Strain Interventions: Intervention Not Indicated ?Housing Interventions: Intervention Not Indicated ?Transportation Interventions: Intervention Not Indicated ? ? ?Readmission Risk Interventions ?   ? View : No data to display.  ?  ?  ?  ? ? ? ? ? ?

## 2021-04-25 NOTE — Discharge Summary (Signed)
Physician Discharge Summary  ?NICHELE SLAWSON IHK:[id number] DOB: 05-15-1945 DOA: 04/15/2021 ? ?PCP: [name], MD ? ?Admit date: 04/15/2021 ?Discharge date: 04/22/2021 ? ?Time spent: 35 minutes ? ?Recommendations for Outpatient Follow-up:  ?PCP in 1 week, please check BMP at follow-up ?Cardiology Dr. Johney Frame in 2 to 3 weeks ?Lymphedema clinic follow-up ? ? ?Discharge Diagnoses:  ?Principal Problem: ?  Acute on chronic diastolic CHF (congestive heart failure) ([place]) ?Active Problems: ?  Acute encephalopathy ?  COPD (chronic obstructive pulmonary disease) ([place]) ?  Hypertension ?Chronic lymphedema ? ? ?Discharge Condition: Stable ? ?Diet recommendation: Low-sodium ? ?Filed Weights  ? 04/19/21 0508 04/20/21 0640 04/22/21 0500  ?Weight: 69.9 kg 72.5 kg 66 kg  ? ? ?History of present illness:  ?75/F with chronic diastolic CHF, chronic severe lymphedema, COPD, hypertension presented with worsening swelling and discomfort in her legs X 4 to 5 days with difficulty ambulating, Dopplers were negative for DVT, she  was started on diuretics ?  ? ?Hospital Course:  ?Acute on chronic diastolic CHF (congestive heart failure) ([place]) ?-Presented with progressively worsening acute on chronic edema of bilateral LE, predominantly right heart failure ?-Diuresed with IV Lasix, she is 5.6 L negative, weight down 9 kg ?-Transitioned to oral Lasix 60 Mg daily ?-Echo noted EF of 60-65%, mild LVH, grade 1 DD ?-Will refer to lymphedema clinic after discharge ?-Dopplers negative for DVT ?-Discharged home in stable condition, follow-up with PCP in 1 week and cardiology Dr. Johney Frame in 2 to 3 weeks, needs BMP check at follow-up ?  ?Chronic lymphedema ?-See discussion above ?  ?Acute encephalopathy ?- Patient had reported 1 day of progressive confusion on admission, currently resolved  ?-ABG with no hypercarbia, metabolic panel within normal limits, no UTI ?-CT head with no acute intracranial abnormality, moderate to severe small vessel  disease. ?-Resolved ?  ?COPD (chronic obstructive pulmonary disease) ([place]) ?-Has 30-pack-year history, currently not wheezing  ?-Continue albuterol as needed  ?  ?Hypertension ?-Stable, continue metoprolol ? ?  ?Consultants:  ?Cardiology ? ?Echo 04/17/21: ?1. Left ventricular ejection fraction, by estimation, is 65 to 70%. The  ?left ventricle has normal function. The left ventricle has no regional  ?wall motion abnormalities. There is mild left ventricular hypertrophy.  ?Left ventricular diastolic parameters  ?are consistent with Grade I diastolic dysfunction (impaired relaxation).  ? 2. Right ventricular systolic function is normal. The right ventricular  ?size is normal. Tricuspid regurgitation signal is inadequate for assessing  ?PA pressure.  ? 3. The mitral valve is grossly normal. Trivial mitral valve  ?regurgitation.  ? 4. The aortic valve is tricuspid. Aortic valve regurgitation is not  ?visualized.  ? 5. The inferior vena cava is dilated in size with >50% respiratory  ?variability, suggesting right atrial pressure of 8 mmHg.  ?  ?LE Venous Doppler 04/15/21: ?No DVT ?  ? ?Discharge Exam: ?Vitals:  ? 04/22/21 0740 04/22/21 0946  ?BP: 111/69   ?Pulse: 62 68  ?Resp:    ?Temp: 98.7 ?F (37.1 ?C)   ?SpO2: 95%   ? ? ?General exam: Pleasant chronically ill female sitting up in bed, AAOx3, no distress ?HEENT: No JVD ?CVS: S1-S2, regular rate rhythm ?Lungs: Improved air movement, lungs are clear ?Abdomen: Soft, nontender, bowel sounds present  ?Extremities: Bilateral lymphedema with chronic skin changes ?Psychiatry: Judgement and insight appear normal. Mood & affect appropriate.  ?  ? ?Discharge Instructions ? ? ?Discharge Instructions   ? ? Ambulatory referral to Physical Therapy   Complete by: As directed ?  ?  Diet - low sodium heart healthy   Complete by: As directed ?  ? Increase activity slowly   Complete by: As directed ?  ? No wound care   Complete by: As directed ?  ? ?  ? ?Allergies as of 04/22/2021   ? ?    Reactions  ? Dilaudid [hydromorphone] Swelling  ? SWELLING REACTION UNSPECIFIED   ? Latex Swelling  ? gloves used at dental office caused lips to swell. Used different ones no problem. Never had any problem at hospital or anywhere else.  ? Lidocaine Other (See Comments)  ? "makes my heart race"  ? Sulfa Drugs Cross Reactors Other (See Comments)  ? UNSPECIFIED REACTION OF CHILDHOOD  ? Codeine Nausea And Vomiting  ? ?  ? ?  ?Medication List  ?  ? ?STOP taking these medications   ? ?lisinopril-hydrochlorothiazide 20-12.5 MG tablet ?Commonly known as: ZESTORETIC ?  ? ?  ? ?TAKE these medications   ? ?albuterol 108 (90 Base) MCG/ACT inhaler ?Commonly known as: VENTOLIN HFA ?Inhale 1-2 puffs into the lungs every 6 (six) hours as needed for wheezing or shortness of breath. ?  ?aspirin EC 81 MG tablet ?Take 81 mg by mouth daily with lunch. ?  ?furosemide 40 MG tablet ?Commonly known as: LASIX ?Take 1.5 tablets (60 mg total) by mouth daily. ?What changed: how much to take ?  ?HYDROcodone-acetaminophen 5-325 MG tablet ?Commonly known as: NORCO/VICODIN ?Take 1 tablet by mouth every 6 (six) hours as needed for moderate pain. ?  ?potassium chloride SA 20 MEQ tablet ?Commonly known as: KLOR-CON M ?Take 2 tablets (40 mEq total) by mouth daily. ?What changed:  ?when to take this ?reasons to take this ?  ?Toprol XL 50 MG 24 hr tablet ?Generic drug: metoprolol succinate ?TAKE 1/2 TABLET BY MOUTH TWICE DAILY. TAKE WITH OR IMMEDIATELY FOLLOWING A MEAL Please keep upcoming appt with Dr. Johney Frame new Cardiologist in April 2023 before anymore refills. Thank you Final Attempt ?What changed:  ?how much to take ?how to take this ?when to take this ?  ? ?  ? ?Allergies  ?Allergen Reactions  ? Dilaudid [Hydromorphone] Swelling  ?  SWELLING REACTION UNSPECIFIED   ? Latex Swelling  ?  gloves used at dental office caused lips to swell. Used different ones no problem. Never had any problem at hospital or anywhere else.  ? Lidocaine Other (See  Comments)  ?  "makes my heart race"  ? Sulfa Drugs Cross Reactors Other (See Comments)  ?  UNSPECIFIED REACTION OF CHILDHOOD  ? Codeine Nausea And Vomiting  ? ? Follow-up Information   ? ? [facility] Follow up.   ?Why: Your outpatient rehab referral has been sent to [facility]. The office will call you with start of service information. Please call number listed above for any questions or concerns. ?Contact information: ?[id number]  ?[place] [address] ?[phone] ? ?  ?  ? ?  ?  ? ?  ? ? ? ?The results of significant diagnostics from this hospitalization (including imaging, microbiology, ancillary and laboratory) are listed below for reference.   ? ?Significant Diagnostic Studies: ?CT Head Wo Contrast ? ?Result Date: 04/16/2021 ?CLINICAL DATA:  Minor head trauma. EXAM: CT HEAD WITHOUT CONTRAST TECHNIQUE: Contiguous axial images were obtained from the base of the skull through the vertex without intravenous contrast. RADIATION DOSE REDUCTION: This exam was performed according to the departmental dose-optimization program which includes automated exposure control, adjustment  of the mA and/or kV according to patient size and/or use of iterative reconstruction technique. COMPARISON:  Head CT 05/19/2019, CTA head 05/19/2019 FINDINGS: Brain: There is moderately advanced cerebral atrophy and atrophic ventriculomegaly with moderate to severe small vessel disease of the cerebral white matter and chronic lacunar infarcts in the bilateral corona radiata and thalami and left gangliocapsular area. Relatively mild cerebellar atrophy. No new asymmetry is seen worrisome for cortical based infarct, hemorrhage, mass effect or midline shift. Basal cisterns are clear. Vascular: There are no hyperdense central vessels. 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 Left ICA stent extends from the supraclinoid segment through the left M1 segment. Stent patency cannot be established with a noncontrast exam. Sk

## 2021-05-13 NOTE — Progress Notes (Deleted)
Cardiology Office Note:    Date:  05/13/2021   ID:  Jeanne Lawson, DOB October 20, 1945, MRN [id number]  PCP:  [name], MD   [facility] Providers Cardiologist:  Freada Bergeron, MD {   Referring MD: [name], MD    History of Present Illness:    Jeanne Lawson is a 76 y.o. female with a hx of multiple strokes, hypertension, coronary artery disease with angioplasty 20 years ago, chronic lympedema and diastolic heart failure who presents to clinic for follow-up.   Patient has history of angioplasty over 20 years ago. No stents placed at that time. She was also found to have enlarging aneurysms of the posterior circulation that was stented by Dr. [name]. 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  Patient was admitted in 03/2021 with worsening LE edema and predominant right sided HF symptoms. TTE with LVEF 65-70%, mild LVH, G1DD. Dopplers negative for DVT. She was diuresed with lasix with good response. Transitioned to lasix '60mg'$  daily at discharge. Discharge weight 66kg.  Today, ***  Past Medical History:  Diagnosis Date   Acute on chronic diastolic CHF (congestive heart failure) ([place]) 04/16/2021   Cancer (HCC)    right leg skin    COPD (chronic obstructive pulmonary disease) (H[facility])    Coronary artery disease 1996   Known with prior mild lesion of LAD demonstrated by Cardiac Catheterization in 1996   Edema of foot    She has a history of chronic edema of the left dated back to age 27 when she suufered severe frostbite playing  on the snow as a child   Hypertension    Lower extremity edema    PAC (premature atrial contraction)    Pancreatitis    x2   PONV (postoperative nausea and vomiting)    problems waking up last time 4/16 only time   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 Has normal EF.    Past Surgical History:  Procedure Laterality Date   ABDOMINAL HYSTERECTOMY     APPENDECTOMY     CARDIAC CATHETERIZATION  1996   Mild CAD with vasospasm   CARDIOVASCULAR STRESS TEST  12-02-2001   EF 70%   CHOLECYSTECTOMY N/A 03/09/2016   Procedure: LAPAROSCOPIC CHOLECYSTECTOMY WITH INTRAOPERATIVE CHOLANGIOGRAM;  Surgeon: Georganna Skeans, MD;  Location: [place];  Service: General;  Laterality: N/A;   ENDARTERECTOMY Right 08/12/2014   Procedure: RIGHT  COMMON CAROTID ARTERY EXPOSURE FOR INTERVENTIONAL RADIOLOGY PROCEDURE BY DR.DEVASHWAR,Insertion 6 FR sheath;  Surgeon: Elam Dutch, MD;  Location: [place];  Service: Vascular;  Laterality: Right;   ENDARTERECTOMY Right 08/12/2014   Procedure:  CAROTID  EXPOSURE CLOSURE RIGHT NECK, REPAIR RIGHT COMMON CAROTID ARTERY;  Surgeon: Elam Dutch, MD;  Location: [place];  Service: Vascular;  Laterality: Right;   ENDARTERECTOMY Left 11/18/2014   Procedure: CAROTID EXPOSURE;  Surgeon: Elam Dutch, MD;  Location: [place];  Service: Vascular;  Laterality: Left;   ENDARTERECTOMY Left 11/18/2014   Procedure: CLOSURE CAROTID;  Surgeon: Elam Dutch, MD;  Location: [place];  Service: Vascular;  Laterality: Left;   ENDARTERECTOMY Right 08/22/2017   Procedure: EXPOSURE CAROTID ARTERY FOR [place] SURGERY;  Surgeon: Elam Dutch, MD;  Location: [place];  Service: Vascular;  Laterality: Right;   IR ANGIO INTRA EXTRACRAN SEL INTERNAL CAROTID UNI R MOD SED  08/22/2017   IR ANGIOGRAM FOLLOW UP STUDY  08/22/2017   IR  RADIOLOGIST EVAL & MGMT  06/26/2016   IR RADIOLOGIST EVAL & MGMT  05/08/2017   IR RADIOLOGY PERIPHERAL GUIDED IV START  12/03/2017   IR TRANSCATH/EMBOLIZ  08/22/2017   IR US GUIDE VASC ACCESS RIGHT  12/03/2017   LAPAROSCOPY     NECK SURGERY  50 yrs ago   Left side tumor   RADIOLOGY WITH ANESTHESIA N/A 05/25/2014   Procedure: RADIOLOGY WITH ANESTHESIA;  Surgeon: [name], MD;  Location: [place];  Service: Radiology;  Laterality: N/A;   RADIOLOGY WITH ANESTHESIA N/A 08/12/2014   Procedure: RADIOLOGY WITH ANESTHESIA;  Surgeon: [name], MD;  Location: [place];  Service: Radiology;  Laterality: N/A;   RADIOLOGY WITH ANESTHESIA N/A 03/15/2015   Procedure: MRI OF BRAIN WITHOUT CONTRAST;  Surgeon: Medication Radiologist, MD;  Location: [place];  Service: Radiology;  Laterality: N/A;  DR. TAT/MRI   US ECHOCARDIOGRAPHY  04-26-2009   EF 65-70%   VESICOVAGINAL FISTULA CLOSURE W/ TAH  25 yrs ago   WOUND EXPLORATION Right 08/22/2017   Procedure: REMOVAL OF RIGHT COMMON CAROTID SHEATH AND WOUND CLOSURE;  Surgeon: Rosetta Posner, MD;  Location[facility];  Service: Vascular;  Laterality: Right;    Current Medications: No outpatient medications have been marked as taking for the 05/17/21 encounter (Appointment) with Freada Bergeron, MD.     Allergies:   Dilaudid [hydromorphone], Latex, Lidocaine, Sulfa drugs cross reactors, and Codeine   Social History   Socioeconomic History   Marital status: Widowed    Spouse name: Not on file   Number of children: 4   Years of education: Not on file   Highest education level: High school graduate  Occupational History   Occupation: Retired  Tobacco Use   Smoking status: Former    Packs/day: 1.00    Years: 30.00    Pack years: 30.00    Types: Cigarettes    Quit date: 04/26/2014    Years since quitting: 7.0   Smokeless tobacco: Never  Vaping Use   Vaping Use: Never used  Substance and Sexual Activity   Alcohol use: No   Drug use: No   Sexual  activity: Not on file  Other Topics Concern   Not on file  Social History Narrative   Not on file   Social Determinants of Health   Financial Resource Strain: Low Risk    Difficulty of Paying Living Expenses: Not hard at all  Food Insecurity: No Food Insecurity   Worried About Charity fundraiser in the Last Year: Never true   [place] in the Last Year: Never true  Transportation Needs: No Transportation Needs   Lack of Transportation (Medical): No   Lack of Transportation (Non-Medical): No  Physical Activity: Not on file  Stress: Not on file  Social Connections: Not on file     Family History: The patient's ***family history includes Aneurysm in her sister; Diabetes in  her father; Emphysema in her father; Heart disease in her mother; Hypertension in her daughter and mother; Stroke in her mother; Thyroid disease in her daughter.  ROS:   Please see the history of present illness.    *** All other systems reviewed and are negative.  EKGs/Labs/Other Studies Reviewed:    The following studies were reviewed today: TTE 04/29/2021: IMPRESSIONS     1. Left ventricular ejection fraction, by estimation, is 65 to 70%. The  left ventricle has normal function. The left ventricle has no regional  wall motion abnormalities. There is mild left ventricular hypertrophy.  Left ventricular diastolic parameters  are consistent with Grade I diastolic dysfunction (impaired relaxation).   2. Right ventricular systolic function is normal. The right ventricular  size is normal. Tricuspid regurgitation signal is inadequate for assessing  PA pressure.   3. The mitral valve is grossly normal. Trivial mitral valve  regurgitation.   4. The aortic valve is tricuspid. Aortic valve regurgitation is not  visualized.   5. The inferior vena cava is dilated in size with >50% respiratory  variability, suggesting right atrial pressure of 8 mmHg.   Comparison(s): Changes from prior study are noted.  03/13/2015: LVEF 60-65%,  moderate LVH, grade 1 DD.  EKG:  EKG is *** ordered today.  The ekg ordered today demonstrates ***  Recent Labs: 04/16/2021: ALT 14; B Natriuretic Peptide 303.8 04-29-21: TSH 2.357 04/18/2021: Magnesium 1.8 04/21/2021: Hemoglobin 11.5; Platelets 151 04/22/2021: BUN 22; Creatinine, Ser 1.01; Potassium 3.9; Sodium 137  Recent Lipid Panel    Component Value Date/Time   CHOL 115 04/19/2021 0051   CHOL 147 11/27/2016 1019   TRIG 85 04/19/2021 0051   HDL 52 04/19/2021 0051   HDL 65 11/27/2016 1019   CHOLHDL 2.2 04/19/2021 0051   VLDL 17 04/19/2021 0051   LDLCALC 46 04/19/2021 0051   LDLCALC 56 11/27/2016 1019     Risk Assessment/Calculations:   {Does this patient have ATRIAL FIBRILLATION?:[phone]}       Physical Exam:    VS:  There were no vitals taken for this visit.    Wt Readings from Last 3 Encounters:  04/22/21 145 lb 8.1 oz (66 kg)  07/22/19 167 lb (75.8 kg)  05/30/18 158 lb (71.7 kg)     GEN: *** Well nourished, well developed in no acute distress HEENT: Normal NECK: No JVD; No carotid bruits LYMPHATICS: No lymphadenopathy CARDIAC: ***RRR, no murmurs, rubs, gallops RESPIRATORY:  Clear to auscultation without rales, wheezing or rhonchi  ABDOMEN: Soft, non-tender, non-distended MUSCULOSKELETAL:  No edema; No deformity  SKIN: Warm and dry NEUROLOGIC:  Alert and oriented x 3 PSYCHIATRIC:  Normal affect   ASSESSMENT:    No diagnosis found. PLAN:    In order of problems listed above:  #Chronic Diastolic HF: Last TTE 44/0102 with normal LVEF 65-70%, G1DD, mild LVH. Was recently admitted with acute on chronic HF. Currently, *** -Continue lasix '60mg'$  daily -Start spiro 12.'5mg'$  daily  #CAD s/p remote angioplasty: No anginal symptoms. LVEF 65-70% on TTE. -Continue ASA '81mg'$  daily -Continue metop '50mg'$  daily  #History of CVA: -Continue ASA '81mg'$  daily -?statin       {Are you ordering a CV Procedure (e.g. stress test, cath, DCCV,  TEE, etc)?   Press F2        :[id number]}    Medication Adjustments/Labs and Tests Ordered: Current medicines are reviewed at length with the patient today.  Concerns regarding medicines are outlined above.  No orders of the defined types  were placed in this encounter.  No orders of the defined types were placed in this encounter.   There are no Patient Instructions on file for this visit.   Signed, Freada Bergeron, MD  05/13/2021 3:04 PM    [place]

## 2021-05-17 ENCOUNTER — Ambulatory Visit: Payer: Medicare Other | Admitting: Cardiology

## 2021-06-28 ENCOUNTER — Inpatient Hospital Stay (HOSPITAL_COMMUNITY)
Admission: EM | Admit: 2021-06-28 | Discharge: 2021-07-04 | DRG: 291 | Disposition: A | Discharge status: Skilled Nursing Facility | Payer: Medicare Other | Attending: Family Medicine | Admitting: Family Medicine

## 2021-06-28 ENCOUNTER — Other Ambulatory Visit: Payer: Self-pay

## 2021-06-28 ENCOUNTER — Encounter (HOSPITAL_COMMUNITY): Payer: Self-pay

## 2021-06-28 ENCOUNTER — Observation Stay (HOSPITAL_COMMUNITY): Payer: Medicare Other

## 2021-06-28 DIAGNOSIS — Z79899 Other long term (current) drug therapy: Secondary | ICD-10-CM

## 2021-06-28 DIAGNOSIS — I10 Essential (primary) hypertension: Secondary | ICD-10-CM | POA: Diagnosis present

## 2021-06-28 DIAGNOSIS — I509 Heart failure, unspecified: Secondary | ICD-10-CM | POA: Diagnosis not present

## 2021-06-28 DIAGNOSIS — D696 Thrombocytopenia, unspecified: Secondary | ICD-10-CM | POA: Diagnosis present

## 2021-06-28 DIAGNOSIS — Z87891 Personal history of nicotine dependence: Secondary | ICD-10-CM

## 2021-06-28 DIAGNOSIS — Z91040 Latex allergy status: Secondary | ICD-10-CM

## 2021-06-28 DIAGNOSIS — Z8249 Family history of ischemic heart disease and other diseases of the circulatory system: Secondary | ICD-10-CM

## 2021-06-28 DIAGNOSIS — Z888 Allergy status to other drugs, medicaments and biological substances status: Secondary | ICD-10-CM

## 2021-06-28 DIAGNOSIS — Z882 Allergy status to sulfonamides status: Secondary | ICD-10-CM

## 2021-06-28 DIAGNOSIS — I5032 Chronic diastolic (congestive) heart failure: Secondary | ICD-10-CM

## 2021-06-28 DIAGNOSIS — Z825 Family history of asthma and other chronic lower respiratory diseases: Secondary | ICD-10-CM

## 2021-06-28 DIAGNOSIS — J449 Chronic obstructive pulmonary disease, unspecified: Secondary | ICD-10-CM | POA: Diagnosis present

## 2021-06-28 DIAGNOSIS — R600 Localized edema: Principal | ICD-10-CM

## 2021-06-28 DIAGNOSIS — Z8673 Personal history of transient ischemic attack (TIA), and cerebral infarction without residual deficits: Secondary | ICD-10-CM | POA: Diagnosis not present

## 2021-06-28 DIAGNOSIS — R531 Weakness: Secondary | ICD-10-CM | POA: Diagnosis present

## 2021-06-28 DIAGNOSIS — Z90710 Acquired absence of both cervix and uterus: Secondary | ICD-10-CM

## 2021-06-28 DIAGNOSIS — I2510 Atherosclerotic heart disease of native coronary artery without angina pectoris: Secondary | ICD-10-CM | POA: Diagnosis present

## 2021-06-28 DIAGNOSIS — Z7982 Long term (current) use of aspirin: Secondary | ICD-10-CM

## 2021-06-28 DIAGNOSIS — Z885 Allergy status to narcotic agent status: Secondary | ICD-10-CM

## 2021-06-28 DIAGNOSIS — Z9049 Acquired absence of other specified parts of digestive tract: Secondary | ICD-10-CM

## 2021-06-28 DIAGNOSIS — I5033 Acute on chronic diastolic (congestive) heart failure: Secondary | ICD-10-CM | POA: Diagnosis present

## 2021-06-28 DIAGNOSIS — I890 Lymphedema, not elsewhere classified: Secondary | ICD-10-CM | POA: Diagnosis not present

## 2021-06-28 DIAGNOSIS — I5082 Biventricular heart failure: Secondary | ICD-10-CM | POA: Diagnosis present

## 2021-06-28 DIAGNOSIS — I110 Hypertensive heart disease with heart failure: Secondary | ICD-10-CM | POA: Diagnosis not present

## 2021-06-28 DIAGNOSIS — Z85828 Personal history of other malignant neoplasm of skin: Secondary | ICD-10-CM

## 2021-06-28 DIAGNOSIS — Z91148 Patient's other noncompliance with medication regimen for other reason: Secondary | ICD-10-CM

## 2021-06-28 LAB — BRAIN NATRIURETIC PEPTIDE: B Natriuretic Peptide: 367.1 pg/mL — ABNORMAL HIGH (ref 0.0–100.0)

## 2021-06-28 LAB — CBC WITH DIFFERENTIAL/PLATELET
Abs Immature Granulocytes: 0.01 10*3/uL (ref 0.00–0.07)
Basophils Absolute: 0 10*3/uL (ref 0.0–0.1)
Basophils Relative: 1 %
Eosinophils Absolute: 0.1 10*3/uL (ref 0.0–0.5)
Eosinophils Relative: 2 %
HCT: 33.2 % — ABNORMAL LOW (ref 36.0–46.0)
Hemoglobin: 10.4 g/dL — ABNORMAL LOW (ref 12.0–15.0)
Immature Granulocytes: 0 %
Lymphocytes Relative: 21 %
Lymphs Abs: 0.8 10*3/uL (ref 0.7–4.0)
MCH: 27.9 pg (ref 26.0–34.0)
MCHC: 31.3 g/dL (ref 30.0–36.0)
MCV: 89 fL (ref 80.0–100.0)
Monocytes Absolute: 0.3 10*3/uL (ref 0.1–1.0)
Monocytes Relative: 9 %
Neutro Abs: 2.4 10*3/uL (ref 1.7–7.7)
Neutrophils Relative %: 67 %
Platelets: 124 10*3/uL — ABNORMAL LOW (ref 150–400)
RBC: 3.73 MIL/uL — ABNORMAL LOW (ref 3.87–5.11)
RDW: 15 % (ref 11.5–15.5)
WBC: 3.7 10*3/uL — ABNORMAL LOW (ref 4.0–10.5)
nRBC: 0 % (ref 0.0–0.2)

## 2021-06-28 LAB — URINALYSIS, ROUTINE W REFLEX MICROSCOPIC
Bilirubin Urine: NEGATIVE
Glucose, UA: NEGATIVE mg/dL
Hgb urine dipstick: NEGATIVE
Ketones, ur: NEGATIVE mg/dL
Leukocytes,Ua: NEGATIVE
Nitrite: NEGATIVE
Protein, ur: NEGATIVE mg/dL
Specific Gravity, Urine: 1.005 (ref 1.005–1.030)
pH: 6 (ref 5.0–8.0)

## 2021-06-28 LAB — BASIC METABOLIC PANEL
Anion gap: 9 (ref 5–15)
BUN: 9 mg/dL (ref 8–23)
CO2: 27 mmol/L (ref 22–32)
Calcium: 8.6 mg/dL — ABNORMAL LOW (ref 8.9–10.3)
Chloride: 105 mmol/L (ref 98–111)
Creatinine, Ser: 0.86 mg/dL (ref 0.44–1.00)
GFR, Estimated: >60 mL/min (ref >60)
Glucose, Bld: 82 mg/dL (ref 70–99)
Potassium: 3.9 mmol/L (ref 3.5–5.1)
Sodium: 141 mmol/L (ref 135–145)

## 2021-06-28 MED ORDER — FUROSEMIDE 10 MG/ML IJ SOLN
40.0000 mg | Freq: Once | INTRAMUSCULAR | Status: AC
  Administered 2021-06-28: 40 mg via INTRAVENOUS
  Filled 2021-06-28: qty 4

## 2021-06-28 MED ORDER — POTASSIUM CHLORIDE CRYS ER 20 MEQ PO TBCR
40.0000 meq | EXTENDED_RELEASE_TABLET | Freq: Every day | ORAL | Status: DC
  Administered 2021-06-28 – 2021-07-04 (×7): 40 meq via ORAL
  Filled 2021-06-28 (×7): qty 2

## 2021-06-28 MED ORDER — HYDROCODONE-ACETAMINOPHEN 5-325 MG PO TABS
0.5000 | ORAL_TABLET | Freq: Four times a day (QID) | ORAL | Status: DC | PRN
  Administered 2021-06-30 – 2021-07-04 (×10): 0.5 via ORAL
  Filled 2021-06-28 (×11): qty 1

## 2021-06-28 MED ORDER — ENOXAPARIN SODIUM 40 MG/0.4ML IJ SOSY
40.0000 mg | PREFILLED_SYRINGE | INTRAMUSCULAR | Status: DC
  Administered 2021-06-28 – 2021-07-03 (×6): 40 mg via SUBCUTANEOUS
  Filled 2021-06-28 (×6): qty 0.4

## 2021-06-28 MED ORDER — FUROSEMIDE 10 MG/ML IJ SOLN
40.0000 mg | Freq: Every day | INTRAMUSCULAR | Status: DC
  Administered 2021-06-29: 40 mg via INTRAVENOUS
  Filled 2021-06-28: qty 4

## 2021-06-28 MED ORDER — ASPIRIN 81 MG PO TBEC
81.0000 mg | DELAYED_RELEASE_TABLET | Freq: Every day | ORAL | Status: DC
  Administered 2021-06-29 – 2021-07-04 (×6): 81 mg via ORAL
  Filled 2021-06-28 (×6): qty 1

## 2021-06-28 MED ORDER — METOPROLOL SUCCINATE ER 25 MG PO TB24
25.0000 mg | ORAL_TABLET | Freq: Every day | ORAL | Status: DC
  Administered 2021-06-29 – 2021-07-04 (×6): 25 mg via ORAL
  Filled 2021-06-28 (×6): qty 1

## 2021-06-28 NOTE — ED Triage Notes (Signed)
Pt has increased BLE swelling and weeping over the last 4 days. Pt states she is on diuretic but is not helping. Pt from assisted living at the [facility]. Pt endorses history of chf. Denies SOB or pain

## 2021-06-28 NOTE — H&P (Signed)
History and Physical    Patient: Jeanne Lawson SJG:[id number] DOB: [birth year] DOA: 06/28/2021 DOS: the patient was seen and examined on 06/28/2021 PCP: [name], MD  Patient coming from: Home  Chief Complaint:  Chief Complaint  Patient presents with   Leg Swelling   HPI: Jeanne Lawson is a 76 y.o. female with medical history significant of chronic diastolic CHF, CVA, chronic severe lymphedema, COPD and hypertension who presents with worsening swelling of the lower extremity.  Initially consulted by ED  because patient has been having increasing lower extremity swelling for the past few days as well as orthopnea, increasing shortness of breath and weakness.  However patient denies any shortness of breath or any weakness.  Reports she was still able to carry her own groceries into the house today.  She says her lower extremity has started weeping recently.  She has tried to take her medications daily but there could be a chance that she misses her Lasix sometimes.   She was hospitalized back in March for acute on chronic diastolic CHF with predominantly right heart failure.  She was discharged home on 60 mg Lasix daily and referred to lymphedema clinic.  States she had an appointment that was canceled by the lymphedema clinic and has not yet gone.  Also reports that she has never taken up to 60 mg of Lasix and is currently on 30 mg.  In the ED, she was afebrile normotensive on room air.  Has leukocytosis of 3.7K, hemoglobin at baseline at 10.4.  Thrombocytopenia at baseline around 124.  BMP unremarkable. BNP of 367 from a prior of 303.  UA is negative.  Chest x-ray shows stable cardiomegaly with no other acute findings. Review of Systems: As mentioned in the history of present illness. All other systems reviewed and are negative. Past Medical History:  Diagnosis Date   Acute on chronic diastolic CHF (congestive heart failure) ([place]) 04/16/2021   Cancer (HCC)    right leg skin     COPD (chronic obstructive pulmonary disease) (HCC)    Coronary artery disease 1996   Known with prior mild lesion of LAD demonstrated by Cardiac Catheterization in 1996   Edema of foot    She has a history of chronic edema of the left dated back to age 67 when she suufered severe frostbite playing  on the snow as a child   Hypertension    Lower extremity edema    PAC (premature atrial contraction)    Pancreatitis    x2   PONV (postoperative nausea and vomiting)    problems waking up last time 4/16 only time   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 Has normal EF.   Past Surgical History:  Procedure Laterality Date   ABDOMINAL HYSTERECTOMY     APPENDECTOMY     CARDIAC CATHETERIZATION  1996   Mild CAD with vasospasm   CARDIOVASCULAR STRESS TEST  12-02-2001   EF 70%   CHOLECYSTECTOMY N/A 03/09/2016  Procedure: LAPAROSCOPIC CHOLECYSTECTOMY WITH INTRAOPERATIVE CHOLANGIOGRAM;  Surgeon: Georganna Skeans, MD;  Location: [place];  Service: General;  Laterality: N/A;   ENDARTERECTOMY Right 08/12/2014   Procedure: RIGHT  COMMON CAROTID ARTERY EXPOSURE FOR INTERVENTIONAL RADIOLOGY PROCEDURE BY DR.DEVASHWAR,Insertion 6 FR sheath;  Surgeon: Elam Dutch, MD;  Location: [place];  Service: Vascular;  Laterality: Right;   ENDARTERECTOMY Right 08/12/2014   Procedure:  CAROTID  EXPOSURE CLOSURE RIGHT NECK, REPAIR RIGHT COMMON CAROTID ARTERY;  Surgeon: Elam Dutch, MD;  Location: [place];  Service: Vascular;  Laterality: Right;   ENDARTERECTOMY Left 11/18/2014    Procedure: CAROTID EXPOSURE;  Surgeon: Elam Dutch, MD;  Location: [place];  Service: Vascular;  Laterality: Left;   ENDARTERECTOMY Left 11/18/2014   Procedure: CLOSURE CAROTID;  Surgeon: Elam Dutch, MD;  Location: [place];  Service: Vascular;  Laterality: Left;   ENDARTERECTOMY Right 08/22/2017   Procedure: EXPOSURE CAROTID ARTERY FOR [place];  Surgeon: Elam Dutch, MD;  Location: [place];  Service: Vascular;  Laterality: Right;   IR ANGIO INTRA EXTRACRAN SEL INTERNAL CAROTID UNI R MOD SED  08/22/2017   IR ANGIOGRAM FOLLOW UP STUDY  08/22/2017   IR RADIOLOGIST EVAL & MGMT  06/26/2016   IR RADIOLOGIST EVAL & MGMT  05/08/2017   IR RADIOLOGY PERIPHERAL GUIDED IV START  12/03/2017   IR TRANSCATH/EMBOLIZ  08/22/2017   IR US GUIDE VASC ACCESS RIGHT  12/03/2017   LAPAROSCOPY     NECK SURGERY  50 yrs ago   Left side tumor   RADIOLOGY WITH ANESTHESIA N/A 05/25/2014   Procedure: RADIOLOGY WITH ANESTHESIA;  Surgeon: [name], MD;  Location: [place];  Service: Radiology;  Laterality: N/A;   RADIOLOGY WITH ANESTHESIA N/A 08/12/2014   Procedure: RADIOLOGY WITH ANESTHESIA;  Surgeon: [name], MD;  Location: [place];  Service: Radiology;  Laterality: N/A;   RADIOLOGY WITH ANESTHESIA N/A 03/15/2015   Procedure: MRI OF BRAIN WITHOUT CONTRAST;  Surgeon: Medication Radiologist, MD;  Location: [place];  Service: Radiology;  Laterality: N/A;  DR. TAT/MRI   US ECHOCARDIOGRAPHY  04-26-2009   EF 65-70%   VESICOVAGINAL FISTULA CLOSURE W/ TAH  25 yrs ago   WOUND EXPLORATION Right 08/22/2017   Procedure: REMOVAL OF RIGHT COMMON CAROTID SHEATH AND WOUND CLOSURE;  Surgeon: Rosetta Posner, MD;  Location[facility];  Service: Vascular;  Laterality: Right;   Social History:  reports that she quit smoking about 7 years ago. Her smoking use included cigarettes. She has a 30.00 pack-year smoking history. She has never used smokeless tobacco. She reports that she does not drink alcohol and does not use  drugs.  Allergies  Allergen Reactions   Dilaudid [Hydromorphone] Swelling    SWELLING REACTION UNSPECIFIED    Latex Swelling    gloves used at dental office caused lips to swell. Used different ones no problem. Never had any problem at hospital or anywhere else.   Lidocaine Other (See Comments)    "makes my heart race"   Sulfa Drugs Cross Reactors Other (See Comments)    UNSPECIFIED REACTION OF CHILDHOOD   Codeine Nausea And Vomiting    Family History  Problem Relation Age of Onset   Emphysema Father    Diabetes Father    Aneurysm Sister    Stroke Mother    Hypertension Mother    Heart disease Mother    Hypertension Daughter    Thyroid disease Daughter     Prior to Admission medications   Medication  Sig Start Date End Date Taking? Authorizing Provider  albuterol (PROVENTIL HFA;VENTOLIN HFA) 108 (90 BASE) MCG/ACT inhaler Inhale 1-2 puffs into the lungs every 6 (Lawson) hours as needed for wheezing or shortness of breath.   Yes [provider]  aspirin EC 81 MG tablet Take 81 mg by mouth daily with lunch.    Yes [provider]  HYDROcodone-acetaminophen (NORCO/VICODIN) 5-325 MG tablet Take 1 tablet by mouth every 6 (Lawson) hours as needed for moderate pain. Patient taking differently: Take 0.5 tablets by mouth every 6 (Lawson) hours as needed for moderate pain. 08/09/20  Yes Newt Minion, MD  TOPROL XL 50 MG 24 hr tablet TAKE 1/2 TABLET BY MOUTH TWICE DAILY. TAKE WITH OR IMMEDIATELY FOLLOWING A MEAL Please keep upcoming appt with Dr. Johney Frame new Cardiologist in April 2023 before anymore refills. Thank you Final Attempt Patient taking differently: Take 25 mg by mouth daily. TAKE 1/2 TABLET BY MOUTH TWICE DAILY. TAKE WITH OR IMMEDIATELY FOLLOWING A MEAL Please keep upcoming appt with Dr. Johney Frame new Cardiologist in April 2023 before anymore refills. Thank you Final Attempt 03/01/21  Yes Freada Bergeron, MD  furosemide (LASIX) 40 MG tablet Take 1.5 tablets (60 mg  total) by mouth daily. Patient not taking: Reported on 06/28/2021 04/22/21   Domenic Polite, MD  potassium chloride SA (KLOR-CON M) 20 MEQ tablet Take 2 tablets (40 mEq total) by mouth daily. Patient not taking: Reported on 06/28/2021 04/22/21   Domenic Polite, MD    Physical Exam: Vitals:   06/28/21 1537 06/28/21 1556 06/28/21 1559 06/28/21 1600  BP:  (!) 105/55  114/69  Pulse:  (!) 59  (!) 56  Resp:  18  15  Temp:   (!) 97.4 F (36.3 C)   TempSrc:   Oral   SpO2:  96%  100%  Weight: 66 kg     Height: '5\' 1"'$  (1.549 m)      Constitutional: NAD, calm, comfortable, elderly female laying upright in bed Eyes: PERRL, lids and conjunctivae normal ENMT: Mucous membranes are moist.  Neck: normal, supple Respiratory: clear to auscultation bilaterally, no wheezing, no crackles. Normal respiratory effort.  Cardiovascular: Regular rate and rhythm, no murmurs / rubs / gallops.  Large truncal lower extremity with weeping of fluids and bilateral erythema   Abdomen: no tenderness, no masses palpated. Bowel sounds positive.  Musculoskeletal: no clubbing / cyanosis. No joint deformity upper and lower extremities. Normal muscle tone.  Skin: no rashes, lesions, ulcers. No induration Neurologic: CN 2-12 grossly intact. Strength 5/5 in all 4.  Psychiatric: Normal judgment and insight. Alert and oriented x 3. Normal mood. Data Reviewed:  See HPI  Assessment and Plan: * Lymphedema Worsening of lower extremity lymphedema. -Has been taking 30 mg Lasix at home.  Suspect she needs up titration of this. -Continue daily IV 40 mg Lasix.  Follow intake and output. -Patient has not yet followed with lymphedema clinic.  Recommends that she follows up with this.  Chronic diastolic CHF (congestive heart failure) (HCC) Not in acute exacerbation.  Aside from increasing lower extremity edema from her chronic lymphedema, there is no pulmonary edema seen on chest x-ray and she does not have any respiratory  symptoms.  History of CVA (cerebrovascular accident) Continue aspirin  Essential hypertension Continue metoprolol      Advance Care Planning:   Code Status: Full Code   Consults: None  Family Communication: No family at bedside  Severity of Illness: The appropriate patient status for this patient  is OBSERVATION. Observation status is judged to be reasonable and necessary in order to provide the required intensity of service to ensure the patient's safety. The patient's presenting symptoms, physical exam findings, and initial radiographic and laboratory data in the context of their medical condition is felt to place them at decreased risk for further clinical deterioration. Furthermore, it is anticipated that the patient will be medically stable for discharge from the hospital within 2 midnights of admission.   Author: Orene Desanctis, DO 06/28/2021 8:21 PM  For on call review [email or web address].

## 2021-06-28 NOTE — ED Notes (Signed)
Help get patient straighten up in the bed patient is resting with call bell in reach and family at bedside 

## 2021-06-28 NOTE — Assessment & Plan Note (Signed)
Worsening of lower extremity lymphedema. -Has been taking 30 mg Lasix at home.  Suspect she needs up titration of this. -Continue daily IV 40 mg Lasix.  Follow intake and output. -Patient has not yet followed with lymphedema clinic.  Recommends that she follows up with this.

## 2021-06-28 NOTE — ED Notes (Signed)
Contact daughter Jeanne Lawson for any updates, her number is listed in the chart

## 2021-06-28 NOTE — ED Provider Notes (Signed)
[place] EMERGENCY DEPARTMENT Provider Note   CSN: [id number] Arrival date & time: 06/28/21  1528     History  Chief Complaint  Patient presents with   Leg Swelling    Jeanne Lawson is a 76 y.o. female with a past medical history of CHF and lymphedema who presents to the emergency department complaining of bilateral lower extremity swelling and weeping over the past 4 days.  Patient is on antidiuretic however per daughter at bedside patient is not compliant with her medications.  Daughter has a nurse that has recently started to evaluate patient to ensure that she is taking her medications and elevating her legs as well as utilizing compression stockings as of 5 days ago.  No meds tried prior to arrival to the ED.  Denies chest pain, shortness of breath, fever, chills, dysuria, hematuria, abdominal pain, nausea, vomiting.  Patient's daughter notes that patient ambulates with a shuffle at baseline.  Patient is currently living at assisted living at [facility].  The history is provided by the patient. No language interpreter was used.      Home Medications Prior to Admission medications   Medication Sig Start Date End Date Taking? Authorizing Provider  albuterol (PROVENTIL HFA;VENTOLIN HFA) 108 (90 BASE) MCG/ACT inhaler Inhale 1-2 puffs into the lungs every 6 (six) hours as needed for wheezing or shortness of breath.    [provider]  aspirin EC 81 MG tablet Take 81 mg by mouth daily with lunch.     [provider]  furosemide (LASIX) 40 MG tablet Take 1.5 tablets (60 mg total) by mouth daily. 04/22/21   Domenic Polite, MD  HYDROcodone-acetaminophen (NORCO/VICODIN) 5-325 MG tablet Take 1 tablet by mouth every 6 (six) hours as needed for moderate pain. 08/09/20   Newt Minion, MD  potassium chloride SA (KLOR-CON M) 20 MEQ tablet Take 2 tablets (40 mEq total) by mouth daily. 04/22/21   Domenic Polite, MD  TOPROL XL 50 MG 24 hr tablet TAKE 1/2  TABLET BY MOUTH TWICE DAILY. TAKE WITH OR IMMEDIATELY FOLLOWING A MEAL Please keep upcoming appt with Dr. Johney Frame new Cardiologist in April 2023 before anymore refills. Thank you Final Attempt Patient taking differently: Take 25 mg by mouth daily. TAKE 1/2 TABLET BY MOUTH TWICE DAILY. TAKE WITH OR IMMEDIATELY FOLLOWING A MEAL Please keep upcoming appt with Dr. Johney Frame new Cardiologist in April 2023 before anymore refills. Thank you Final Attempt 03/01/21   Freada Bergeron, MD      Allergies    Dilaudid [hydromorphone], Latex, Lidocaine, Sulfa drugs cross reactors, and Codeine    Review of Systems   Review of Systems  Constitutional:  Negative for chills and fever.  Respiratory:  Negative for shortness of breath.   Cardiovascular:  Positive for leg swelling. Negative for chest pain.  Gastrointestinal:  Negative for abdominal pain, nausea and vomiting.  Genitourinary:  Negative for dysuria and hematuria.  All other systems reviewed and are negative.  Physical Exam Updated Vital Signs BP 114/69   Pulse (!) 56   Temp (!) 97.4 F (36.3 C) (Oral)   Resp 15   Ht '5\' 1"'$  (1.549 m)   Wt 66 kg   SpO2 100%   BMI 27.49 kg/m  Physical Exam Vitals and nursing note reviewed.  Constitutional:      General: She is not in acute distress.    Appearance: She is not diaphoretic.  HENT:     Head: Normocephalic and atraumatic.  Mouth/Throat:     Pharynx: No oropharyngeal exudate.  Eyes:     General: No scleral icterus.    Conjunctiva/sclera: Conjunctivae normal.  Cardiovascular:     Rate and Rhythm: Normal rate and regular rhythm.     Pulses: Normal pulses.     Heart sounds: Normal heart sounds.  Pulmonary:     Effort: Pulmonary effort is normal. No respiratory distress.     Breath sounds: Normal breath sounds. No wheezing.  Abdominal:     General: Bowel sounds are normal.     Palpations: Abdomen is soft. There is no mass.     Tenderness: There is no abdominal tenderness. There is  no guarding or rebound.  Musculoskeletal:        General: Normal range of motion.     Cervical back: Normal range of motion and neck supple.     Comments: Significant edema noted to bilateral lower extremities along with weeping.  No appreciable erythema noted.  No tenderness to palpation noted to bilateral lower extremities.  Unable to palpate pedal pulses due to the significant amount of swelling.   Skin:    General: Skin is warm and dry.  Neurological:     Mental Status: She is alert.  Psychiatric:        Behavior: Behavior normal.    ED Results / Procedures / Treatments   Labs (all labs ordered are listed, but only abnormal results are displayed) Labs Reviewed  BASIC METABOLIC PANEL - Abnormal; Notable for the following components:      Result Value   Calcium 8.6 (*)    All other components within normal limits  CBC WITH DIFFERENTIAL/PLATELET - Abnormal; Notable for the following components:   WBC 3.7 (*)    RBC 3.73 (*)    Hemoglobin 10.4 (*)    HCT 33.2 (*)    Platelets 124 (*)    All other components within normal limits  URINALYSIS, ROUTINE W REFLEX MICROSCOPIC - Abnormal; Notable for the following components:   Color, Urine STRAW (*)    All other components within normal limits  BRAIN NATRIURETIC PEPTIDE - Abnormal; Notable for the following components:   B Natriuretic Peptide 367.1 (*)    All other components within normal limits    EKG None  Radiology No results found.  Procedures Procedures    Medications Ordered in ED Medications  furosemide (LASIX) injection 40 mg (has no administration in time range)    ED Course/ Medical Decision Making/ A&P Clinical Course as of 06/28/21 1915  Tue Jun 28, 2021  1843 Attending in to evaluate patient and recommends cxr and admission. [SB]  1853 Consult to hospitalist, Dr. Flossie Buffy who notes that she would like for the chest xray results as well as for the patient to be ambulated prior to consideration for admission at  this time. [SB]  1906 Attending spoke with hospitalist who agrees with admission at this time as well as chest x-ray. [SB]    Clinical Course User Index [SB] Cruzita Lipa A, PA-C                           Medical Decision Making Amount and/or Complexity of Data Reviewed Labs: ordered.   Pt presents with bilateral lower extremity swelling and weeping over the past 4 days.  Patient is prescribed 40 mg Lasix however per patient's daughter patient is not compliant with her medications.  Patient has a nurse that recently started, evaluated  patient is a 5 days ago and utilizing elevating the patient's leg, compression stockings, and ensuring the patient is taking her medications.  No meds tried prior to arrival to the ED.  Vital signs stable, patient afebrile, not tachycardic or hypoxic.  On exam, pt with  Significant edema noted to bilateral lower extremities along with weeping.  No appreciable erythema noted.  No tenderness to palpation noted to bilateral lower extremities.  Unable to palpate pedal pulses due to the significant amount of swelling.  No acute cardiovascular, respiratory exam findings. Differential diagnosis includes acute on chronic CHF, cellulitis, abscess, lymphedema.    Co morbidities that complicate the patient evaluation: CHF Lymphedema  Additional history obtained:  Additional history obtained from Daughter/Son External records from outside source obtained and reviewed including: Recent echocardiogram on 04/17/2021 notable for LVEF of 65-70%.    Labs:  I ordered, and personally interpreted labs.  The pertinent results include:   BNP slightly elevated at 367.1 however stable from previous value 2 months ago of 303.8. BMP overall unremarkable. Urinalysis unremarkable. CBC unremarkable  Imaging: I ordered imaging studies including CXR with results pending at time of admission.   Medications:  I ordered medication including Lasix for symptom management I have reviewed  the patients home medicines and have made adjustments as needed   Consultations: I requested consultation with the Hospitalist, Dr. Flossie Buffy and discussed lab and imaging findings as well as pertinent plan - they recommend: Attending spoke with hospitalist who agreed to admission at this time and also recommended a chest x-ray.  Social Determinants of Health: Patient lives in assisted living    Disposition: Presentation suspicious for increasing bilateral lower extremity swelling in the setting of noncompliance with antidiuretics.  Doubt acute CHF exacerbation at this time, patient asymptomatic without chest pain, dyspnea at this time.  BNP also stable from previous value.  Patient evaluated by attending who agrees with admission at this time.  After consideration of the diagnostic results and the patients response to treatment, I feel that the patient would benefit from Admission to the hospital.  Discussed admission plans with patient at bedside, patient agreeable at this time.  Patient for safe for admission.     This chart was dictated using voice recognition software, Dragon. Despite the best efforts of this provider to proofread and correct errors, errors may still occur which can change documentation meaning.   Final Clinical Impression(s) / ED Diagnoses Final diagnoses:  Bilateral lower extremity edema    Rx / DC Orders ED Discharge Orders     None         Giavanni Odonovan A, PA-C 06/28/21 1915    Regan Lemming, MD 06/28/21 2220

## 2021-06-28 NOTE — Assessment & Plan Note (Signed)
Continue aspirin 

## 2021-06-28 NOTE — Assessment & Plan Note (Signed)
Continue metoprolol. 

## 2021-06-28 NOTE — Assessment & Plan Note (Signed)
Not in acute exacerbation.  Aside from increasing lower extremity edema from her chronic lymphedema, there is no pulmonary edema seen on chest x-ray and she does not have any respiratory symptoms.

## 2021-06-29 DIAGNOSIS — I890 Lymphedema, not elsewhere classified: Secondary | ICD-10-CM | POA: Diagnosis not present

## 2021-06-29 LAB — CBC
HCT: 33.5 % — ABNORMAL LOW (ref 36.0–46.0)
Hemoglobin: 10.4 g/dL — ABNORMAL LOW (ref 12.0–15.0)
MCH: 28 pg (ref 26.0–34.0)
MCHC: 31 g/dL (ref 30.0–36.0)
MCV: 90.1 fL (ref 80.0–100.0)
Platelets: 121 10*3/uL — ABNORMAL LOW (ref 150–400)
RBC: 3.72 MIL/uL — ABNORMAL LOW (ref 3.87–5.11)
RDW: 15.1 % (ref 11.5–15.5)
WBC: 4 10*3/uL (ref 4.0–10.5)
nRBC: 0 % (ref 0.0–0.2)

## 2021-06-29 MED ORDER — FUROSEMIDE 10 MG/ML IJ SOLN
40.0000 mg | Freq: Two times a day (BID) | INTRAMUSCULAR | Status: DC
  Administered 2021-06-29 – 2021-07-03 (×8): 40 mg via INTRAVENOUS
  Filled 2021-06-29 (×9): qty 4

## 2021-06-29 NOTE — ED Notes (Signed)
ED TO INPATIENT HANDOFF REPORT  S Name/Age/Gender Jeanne Lawson 76 y.o. female Room/Bed: 016C/016C  Code Status   Code Status: Full Code  Home/SNF/Other Home Patient oriented to: self and place Is this baseline? Yes   Triage Complete: Triage complete  Chief Complaint Acute CHF (congestive heart failure) ([place]) [I50.9]  Triage Note Pt has increased BLE swelling and weeping over the last 4 days. Pt states she is on diuretic but is not helping. Pt from assisted living at the [facility]. Pt endorses history of chf. Denies SOB or pain   Allergies Allergies  Allergen Reactions   Dilaudid [Hydromorphone] Swelling    SWELLING REACTION UNSPECIFIED    Latex Swelling    gloves used at dental office caused lips to swell. Used different ones no problem. Never had any problem at hospital or anywhere else.   Lidocaine Other (See Comments)    "makes my heart race"   Sulfa Drugs Cross Reactors Other (See Comments)    UNSPECIFIED REACTION OF CHILDHOOD   Codeine Nausea And Vomiting    Level of Care/Admitting Diagnosis ED Disposition     ED Disposition  Admit   Condition  --   [place]: [place] [[id number]]  Level of Care: Telemetry Cardiac [103]  May place patient in observation at [facility] or [place] if equivalent level of care is available:: No  Covid Evaluation: Asymptomatic - no recent exposure (last 10 days) testing not required  Diagnosis: Acute CHF (congestive heart failure) [facility]) [[id number]]  Admitting Physician: Orene Desanctis [[id number]]  Attending Physician: Orene Desanctis [[id number]]          B Medical/Surgery History Past Medical History:  Diagnosis Date   Acute on chronic diastolic CHF (congestive heart failure) ([place]) 04/16/2021   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    COPD (chronic obstructive pulmonary disease) ([place])    Coronary artery disease 1996   Known with prior mild lesion of LAD demonstrated by Cardiac Catheterization in 1996    Edema of foot    She has a history of chronic edema of the left dated back to age 82 when she suufered severe frostbite playing  on the snow as a child   Hypertension    Lower extremity edema    PAC (premature atrial contraction)    Pancreatitis    x2   PONV (postoperative nausea and vomiting)    problems waking up last time 4/16 only time   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 Has normal EF.   Past Surgical History:  Procedure Laterality Date   ABDOMINAL HYSTERECTOMY     APPENDECTOMY     CARDIAC CATHETERIZATION  1996   Mild CAD with vasospasm   CARDIOVASCULAR STRESS TEST  12-02-2001   EF 70%   CHOLECYSTECTOMY N/A 03/09/2016   Procedure: LAPAROSCOPIC CHOLECYSTECTOMY WITH INTRAOPERATIVE CHOLANGIOGRAM;  Surgeon: Georganna Skeans, MD;  Location: [place];  Service: General;  Laterality: N/A;  ENDARTERECTOMY Right 08/12/2014   Procedure: RIGHT  COMMON CAROTID ARTERY EXPOSURE FOR INTERVENTIONAL RADIOLOGY PROCEDURE BY DR.DEVASHWAR,Insertion 6 FR sheath;  Surgeon: Elam Dutch, MD;  Location: [place];  Service: Vascular;  Laterality: Right;   ENDARTERECTOMY Right 08/12/2014   Procedure:  CAROTID  EXPOSURE CLOSURE RIGHT NECK, REPAIR RIGHT COMMON CAROTID ARTERY;  Surgeon: Elam Dutch, MD;  Location: [place];  Service: Vascular;  Laterality: Right;   ENDARTERECTOMY Left 11/18/2014   Procedure: CAROTID EXPOSURE;  Surgeon: Elam Dutch, MD;  Location: [place];  Service: Vascular;  Laterality: Left;   ENDARTERECTOMY Left 11/18/2014   Procedure: CLOSURE  CAROTID;  Surgeon: Elam Dutch, MD;  Location: [place];  Service: Vascular;  Laterality: Left;   ENDARTERECTOMY Right 08/22/2017   Procedure: EXPOSURE CAROTID ARTERY FOR [place];  Surgeon: Elam Dutch, MD;  Location: [place];  Service: Vascular;  Laterality: Right;   IR ANGIO INTRA EXTRACRAN SEL INTERNAL CAROTID UNI R MOD SED  08/22/2017   IR ANGIOGRAM FOLLOW UP STUDY  08/22/2017   IR RADIOLOGIST EVAL & MGMT  06/26/2016   IR RADIOLOGIST EVAL & MGMT  05/08/2017   IR RADIOLOGY PERIPHERAL GUIDED IV START  12/03/2017   IR TRANSCATH/EMBOLIZ  08/22/2017   IR US GUIDE VASC ACCESS RIGHT  12/03/2017   LAPAROSCOPY     NECK SURGERY  50 yrs ago   Left side tumor   RADIOLOGY WITH ANESTHESIA N/A 05/25/2014   Procedure: RADIOLOGY WITH ANESTHESIA;  Surgeon: [name], MD;  Location: [place];  Service: Radiology;  Laterality: N/A;   RADIOLOGY WITH ANESTHESIA N/A 08/12/2014   Procedure: RADIOLOGY WITH ANESTHESIA;  Surgeon: [name], MD;  Location: [place];  Service: Radiology;  Laterality: N/A;   RADIOLOGY WITH ANESTHESIA N/A 03/15/2015   Procedure: MRI OF BRAIN WITHOUT CONTRAST;  Surgeon: Medication Radiologist, MD;  Location: [place];  Service: Radiology;  Laterality: N/A;  DR. TAT/MRI   US ECHOCARDIOGRAPHY  04-26-2009   EF 65-70%   VESICOVAGINAL FISTULA CLOSURE W/ TAH  25 yrs ago   WOUND EXPLORATION Right 08/22/2017   Procedure: REMOVAL OF RIGHT COMMON CAROTID SHEATH AND WOUND CLOSURE;  Surgeon: Rosetta Posner, MD;  Location: [place];  Service: Vascular;  Laterality: Right;     A IV Location/Drains/Wounds Patient Lines/Drains/Airways Status     Active Line/Drains/Airways     Name Placement date Placement time Site Days   Peripheral IV 06/29/21 20 G Right Antecubital 06/29/21  0443  Antecubital  less than 1   External Urinary Catheter 04/19/21  1700  --  71   Incision (Closed) 08/12/14 Neck Right 08/12/14  1415  -- 2513   Incision (Closed) 11/18/14 Neck Left 11/18/14  1023  -- 2415    Incision (Closed) 03/09/16 Abdomen Other (Comment) 03/09/16  1405  -- 1938   Incision (Closed) 08/22/17 Neck Right 08/22/17  0942  -- 1407   Incision - 4 Ports Abdomen 1: Umbilicus 2: Superior;Medial 3: Mid;Lateral;Right Lateral;Right 03/09/16  1340  -- 1938   Wound / Incision (Open or Dehisced) 04/22/21 Non-pressure wound Leg Left 04/22/21  --  Leg  68            Intake/Output Last 24 hours  Intake/Output Summary (Last 24 hours) at 06/29/2021 0911 Last data filed at 06/29/2021 0220 Gross per 24 hour  Intake --  Output 1750 ml  Net -1750 ml    Labs/Imaging Results for orders placed or performed during the hospital encounter of 06/28/21 (from the  past 48 hour(s))  Basic metabolic panel     Status: Abnormal   Collection Time: 06/28/21  5:10 PM  Result Value Ref Range   Sodium 141 135 - 145 mmol/L   Potassium 3.9 3.5 - 5.1 mmol/L   Chloride 105 98 - 111 mmol/L   CO2 27 22 - 32 mmol/L   Glucose, Bld 82 70 - 99 mg/dL    Comment: Glucose reference range applies only to samples taken after fasting for at least 8 hours.   BUN 9 8 - 23 mg/dL   Creatinine, Ser 0.86 0.44 - 1.00 mg/dL   Calcium 8.6 (L) 8.9 - 10.3 mg/dL   GFR, Estimated >60 >60 mL/min    Comment: (NOTE) Calculated using the CKD-EPI Creatinine Equation (2021)    Anion gap 9 5 - 15    Comment: Performed at [facility] [address], [place], [place] [address]  CBC with Differential     Status: Abnormal   Collection Time: 06/28/21  5:10 PM  Result Value Ref Range   WBC 3.7 (L) 4.0 - 10.5 K/uL   RBC 3.73 (L) 3.87 - 5.11 MIL/uL   Hemoglobin 10.4 (L) 12.0 - 15.0 g/dL   HCT 33.2 (L) 36.0 - 46.0 %   MCV 89.0 80.0 - 100.0 fL   MCH 27.9 26.0 - 34.0 pg   MCHC 31.3 30.0 - 36.0 g/dL   RDW 15.0 11.5 - 15.5 %   Platelets 124 (L) 150 - 400 K/uL   nRBC 0.0 0.0 - 0.2 %   Neutrophils Relative % 67 %   Neutro Abs 2.4 1.7 - 7.7 K/uL   Lymphocytes Relative 21 %   Lymphs Abs 0.8 0.7 - 4.0 K/uL   Monocytes Relative 9 %    Monocytes Absolute 0.3 0.1 - 1.0 K/uL   Eosinophils Relative 2 %   Eosinophils Absolute 0.1 0.0 - 0.5 K/uL   Basophils Relative 1 %   Basophils Absolute 0.0 0.0 - 0.1 K/uL   Immature Granulocytes 0 %   Abs Immature Granulocytes 0.01 0.00 - 0.07 K/uL    Comment: Performed at [facility], [address] [place], [place] [address]  Urinalysis, Routine w reflex microscopic Urine, Clean Catch     Status: Abnormal   Collection Time: 06/28/21  5:10 PM  Result Value Ref Range   Color, Urine STRAW (A) YELLOW   APPearance CLEAR CLEAR   Specific Gravity, Urine 1.005 1.005 - 1.030   pH 6.0 5.0 - 8.0   Glucose, UA NEGATIVE NEGATIVE mg/dL   Hgb urine dipstick NEGATIVE NEGATIVE   Bilirubin Urine NEGATIVE NEGATIVE   Ketones, ur NEGATIVE NEGATIVE mg/dL   Protein, ur NEGATIVE NEGATIVE mg/dL   Nitrite NEGATIVE NEGATIVE   Leukocytes,Ua NEGATIVE NEGATIVE    Comment: Performed at [facility] [address] [place],  [address]  Brain natriuretic peptide     Status: Abnormal   Collection Time: 06/28/21  5:10 PM  Result Value Ref Range   B Natriuretic Peptide 367.1 (H) 0.0 - 100.0 pg/mL    Comment: Performed at [facility] [address], [place] [address]  CBC     Status: Abnormal   Collection Time: 06/29/21  4:28 AM  Result Value Ref Range   WBC 4.0 4.0 - 10.5 K/uL   RBC 3.72 (L) 3.87 - 5.11 MIL/uL   Hemoglobin 10.4 (L) 12.0 - 15.0 g/dL   HCT 33.5 (L) 36.0 - 46.0 %  MCV 90.1 80.0 - 100.0 fL   MCH 28.0 26.0 - 34.0 pg   MCHC 31.0 30.0 - 36.0 g/dL   RDW 15.1 11.5 - 15.5 %   Platelets 121 (L) 150 - 400 K/uL   nRBC 0.0 0.0 - 0.2 %    Comment: Performed at [facility] [address] [place], [place] [address]   DG Chest 2 View  Result Date: 06/28/2021 CLINICAL DATA:  Bilateral lower extremity swelling x4 days. EXAM: CHEST - 2 VIEW COMPARISON:  April 16, 2021 FINDINGS: The cardiac silhouette is mildly enlarged and unchanged in size. There is mild to  moderate severity calcification of the thoracic aorta. Mild, stable, diffusely increased lung markings are seen. There is no evidence of focal consolidation, pleural effusion or pneumothorax. The visualized skeletal structures are unremarkable. IMPRESSION: Stable cardiomegaly without acute or active cardiopulmonary disease. Electronically Signed   By: Virgina Norfolk M.D.   On: 06/28/2021 19:37    Pending Labs Unresulted Labs (From admission, onward)    None       Vitals/Pain Today's Vitals   06/29/21 0815 06/29/21 0830 06/29/21 0845 06/29/21 0900  BP: 127/66 128/63 (!) 127/56 (!) 118/59  Pulse: 79 71 64 60  Resp: '19 18 17 19  '$ Temp:      TempSrc:      SpO2: 95% 95% 98% 97%  Weight:      Height:      PainSc:        Isolation Precautions No active isolations  Medications Medications  enoxaparin (LOVENOX) injection 40 mg (40 mg Subcutaneous Given 06/28/21 2157)  furosemide (LASIX) injection 40 mg (has no administration in time range)  aspirin EC tablet 81 mg (has no administration in time range)  metoprolol succinate (TOPROL-XL) 24 hr tablet 25 mg (has no administration in time range)  potassium chloride SA (KLOR-CON M) CR tablet 40 mEq (40 mEq Oral Given 06/28/21 2156)  HYDROcodone-acetaminophen (NORCO/VICODIN) 5-325 MG per tablet 0.5 tablet (has no administration in time range)  furosemide (LASIX) injection 40 mg (40 mg Intravenous Given 06/28/21 1945)    Mobility non-ambulatory Low fall risk   Focused Assessments Cardiac Assessment Handoff:  Cardiac Rhythm: Sinus bradycardia Lab Results  Component Value Date   CKTOTAL 203 04/15/2021   CKMB 1.3 11/02/2008   TROPONINI 0.02        NO INDICATION OF MYOCARDIAL INJURY. 11/02/2008   No results found for: DDIMER Does the Patient currently have chest pain? No   , Pulmonary Assessment Handoff:  Lung sounds:          R Recommendations: See Admitting Provider Note

## 2021-06-29 NOTE — Evaluation (Signed)
Physical Therapy Evaluation Patient Details Name: Jeanne Lawson MRN: [id number] DOB: May 18, 1945 Today's Date: 06/29/2021  History of Present Illness  76 y/o female presented to ED on 06/28/21 for B LE swelling and weeping over 4 days. Admitted for worsening lymphedema. PMH: COPD, CHF, CVA, chronic severe lymphedema, HTN  Clinical Impression  Patient admitted with the above. PTA, patient lives alone at [facility] independent living and was independent utilizing RW. Patient presents with weakness, increased edema in BLE, decreased activity tolerance, impaired balance, and impaired cognition. Patient with STM deficits and decreased attention. Patient required min guard for in room ambulation with RW as patient declined hallway ambulation due to recent lasix and frequent urination. Patient will benefit from skilled PT services during acute stay to address listed deficits. Recommend HHPT at discharge to maximize functional independence and safety in the home.        Recommendations for follow up therapy are one component of a multi-disciplinary discharge planning process, led by the attending physician.  Recommendations may be updated based on patient status, additional functional criteria and insurance authorization.  Follow Up Recommendations Home health PT    Assistance Recommended at Discharge Intermittent Supervision/Assistance  Patient can return home with the following       Equipment Recommendations None recommended by PT  Recommendations for Other Services       Functional Status Assessment Patient has had a recent decline in their functional status and demonstrates the ability to make significant improvements in function in a reasonable and predictable amount of time.     Precautions / Restrictions Precautions Precautions: Fall Precaution Comments: blisters/wounds/increased swelling on BLE Restrictions Weight Bearing Restrictions: No      Mobility  Bed Mobility Overal bed  mobility: Needs Assistance Bed Mobility: Supine to Sit, Sit to Supine     Supine to sit: Mod assist Sit to supine: Min assist   General bed mobility comments: modA for trunk elevation and bringing LEs towards EOB. MinA to return to supine with LE management    Transfers Overall transfer level: Needs assistance Equipment used: Rolling Kindra Bickham (2 wheels) Transfers: Sit to/from Stand Sit to Stand: Min guard           General transfer comment: min guard for safety. Good hand placement    Ambulation/Gait Ambulation/Gait assistance: Min guard Gait Distance (Feet): 12 Feet (+12') Assistive device: Rolling Woodie Trusty (2 wheels) Gait Pattern/deviations: Step-through pattern, Decreased stride length, Trunk flexed Gait velocity: decreased     General Gait Details: ambulated to/from bathroom. patient declined hallway ambulation due to recently receiving lasix and has frequently been urinating. Min guard for safety.  Stairs            Wheelchair Mobility    Modified Rankin (Stroke Patients Only)       Balance Overall balance assessment: Needs assistance Sitting-balance support: No upper extremity supported Sitting balance-Leahy Scale: Good     Standing balance support: Bilateral upper extremity supported, Reliant on assistive device for balance Standing balance-Leahy Scale: Poor Standing balance comment: reliant on UE support on RW                             Pertinent Vitals/Pain Pain Assessment Pain Assessment: Faces Faces Pain Scale: Hurts little more Pain Location: BLE Pain Descriptors / Indicators: Aching, Discomfort Pain Intervention(s): Monitored during session, Repositioned    Home Living Family/patient expects to be discharged to:: Private residence Living Arrangements: Alone   Type of  Home: Apartment Home Access: Elevator       Home Layout: One level Home Equipment: Wheelchair - Publishing copy (2 wheels);Cane - single point;Shower  seat;Toilet riser Additional Comments: [employer]    Prior Function Prior Level of Function : Independent/Modified Independent             Mobility Comments: uses RW for mobility ADLs Comments: Independent cooking, cleaning; gets hair washed at [employer] at [employer]. [facility] provides transportation to appointments     Hand Dominance        Extremity/Trunk Assessment   Upper Extremity Assessment Upper Extremity Assessment: Defer to OT evaluation    Lower Extremity Assessment Lower Extremity Assessment: Generalized weakness (increased LE swelling; wound to top of L foot)    Cervical / Trunk Assessment Cervical / Trunk Assessment: Kyphotic  Communication   Communication: No difficulties  Cognition Arousal/Alertness: Awake/alert Behavior During Therapy: WFL for tasks assessed/performed Overall Cognitive Status: Impaired/Different from baseline Area of Impairment: Memory, Problem solving, Attention                   Current Attention Level: Selective Memory: Decreased short-term memory       Problem Solving: Slow processing, Difficulty sequencing, Requires verbal cues          General Comments      Exercises     Assessment/Plan    PT Assessment Patient needs continued PT services  PT Problem List Decreased strength;Decreased activity tolerance;Decreased balance;Decreased mobility;Decreased cognition;Decreased knowledge of use of DME;Decreased safety awareness;Decreased skin integrity       PT Treatment Interventions DME instruction;Gait training;Therapeutic exercise;Functional mobility training;Therapeutic activities;Balance training;Patient/family education    PT Goals (Current goals can be found in the Care Plan section)  Acute Rehab PT Goals Patient Stated Goal: to go home PT Goal Formulation: With patient Time For Goal Achievement: 07/13/21 Potential to Achieve Goals: Good    Frequency Min 3X/week     Co-evaluation                AM-PAC PT "6 Clicks" Mobility  Outcome Measure Help needed turning from your back to your side while in a flat bed without using bedrails?: A Little Help needed moving from lying on your back to sitting on the side of a flat bed without using bedrails?: A Lot Help needed moving to and from a bed to a chair (including a wheelchair)?: A Little Help needed standing up from a chair using your arms (e.g., wheelchair or bedside chair)?: A Little Help needed to walk in hospital room?: A Little Help needed climbing 3-5 steps with a railing? : A Little 6 Click Score: 17    End of Session Equipment Utilized During Treatment: Gait belt Activity Tolerance: Patient tolerated treatment well Patient left: in bed;with call bell/phone within reach;with bed alarm set Nurse Communication: Mobility status PT Visit Diagnosis: Unsteadiness on feet (R26.81);Muscle weakness (generalized) (M62.81);Other abnormalities of gait and mobility (R26.89)    Time: 0981-1914 PT Time Calculation (min) (ACUTE ONLY): 39 min   Charges:   PT Evaluation $PT Eval Moderate Complexity: 1 Mod PT Treatments $Therapeutic Activity: 23-37 mins        Meleena Munroe A. Gilford Rile PT, DPT Acute Rehabilitation Services Office [phone]   Linna Hoff 06/29/2021, 5:21 PM

## 2021-06-29 NOTE — Care Management Obs Status (Signed)
[facility] NOTIFICATION   Patient Details  Name: Jeanne Lawson MRN: [id number] Date of Birth: 02-Aug-1945   Medicare Observation Status Notification Given:  Yes    Zenon Mayo, RN 06/29/2021, 3:22 PM

## 2021-06-29 NOTE — Progress Notes (Signed)
PROGRESS NOTE    Jeanne Lawson  RCV:[id number] DOB: 1945/03/21 DOA: 06/28/2021 PCP: [name], MD   Brief Narrative:  HPI: Jeanne Lawson is a 76 y.o. female with medical history significant of chronic diastolic CHF, CVA, chronic severe lymphedema, COPD and hypertension who presents with worsening swelling of the lower extremity.   Initially consulted by ED  because patient has been having increasing lower extremity swelling for the past few days as well as orthopnea, increasing shortness of breath and weakness.  However patient denies any shortness of breath or any weakness.  Reports she was still able to carry her own groceries into the house today.  She says her lower extremity has started weeping recently.  She has tried to take her medications daily but there could be a chance that she misses her Lasix sometimes.    She was hospitalized back in March for acute on chronic diastolic CHF with predominantly right heart failure.  She was discharged home on 60 mg Lasix daily and referred to lymphedema clinic.  States she had an appointment that was canceled by the lymphedema clinic and has not yet gone.  Also reports that she has never taken up to 60 mg of Lasix and is currently on 30 mg.   In the ED, she was afebrile normotensive on room air.  Has leukocytosis of 3.7K, hemoglobin at baseline at 10.4.  Thrombocytopenia at baseline around 124.  BMP unremarkable. BNP of 367 from a prior of 303.   UA is negative.  Assessment & Plan:   Principal Problem:   Lymphedema Active Problems:   Essential hypertension   History of CVA (cerebrovascular accident)   Chronic diastolic CHF (congestive heart failure) (HCC)  Acute on chronic diastolic congestive heart failure/bilateral lymphedema: Worsening lower extremity lymphedema.  Patient states that she does not weigh herself as she does not have a scale and she has moved recently to different place.  She has been taking only 30 mg of p.o. Lasix  whereas she was instructed to take 60 mg at the time of discharge during recent hospitalization in March.  No shortness of breath, chest x-ray negative for vascular congestion.  Lungs clear to auscultation.  I will increase her Lasix frequency from 40 mg once daily to twice daily.  Strict I's and O's and daily weight.  We will try to diurese as much as we can and hopefully discharge in next 1 to 2 days.  History of CVA: Continue aspirin  Essential hypertension: Controlled.  Continue metoprolol.  DVT prophylaxis: enoxaparin (LOVENOX) injection 40 mg Start: 06/28/21 2015   Code Status: Full Code  Family Communication:  None present at bedside.  Plan of care discussed with patient in length and he/she verbalized understanding and agreed with it.  Status is: Observation The patient will require care spanning > 2 midnights and should be moved to inpatient because: Needs IV diuresis.   Estimated body mass index is 30.16 kg/m as calculated from the following:   Height as of this encounter: '5\' 1"'$  (1.549 m).   Weight as of this encounter: 72.4 kg.    Nutritional Assessment: Body mass index is 30.16 kg/m.Marland Kitchen Seen by dietician.  I agree with the assessment and plan as outlined below: Nutrition Status:        . Skin Assessment: I have examined the patient's skin and I agree with the wound assessment as performed by the wound care RN as outlined below:    Consultants:  None  Procedures:  None  Antimicrobials:  Anti-infectives (From admission, onward)    None         Subjective: Seen and examined.  Only complaint is leg swelling.  No other complaint.  Objective: Vitals:   06/29/21 0900 06/29/21 0915 06/29/21 0930 06/29/21 1012  BP: (!) 118/59 (!) 108/56 (!) 118/52 133/72  Pulse: 60 (!) 58 (!) 57 70  Resp: '19 19 19 19  '$ Temp:    97.9 F (36.6 C)  TempSrc:    Oral  SpO2: 97% 94% 95% 99%  Weight:    72.4 kg  Height:        Intake/Output Summary (Last 24 hours) at  06/29/2021 1039 Last data filed at 06/29/2021 1015 Gross per 24 hour  Intake 240 ml  Output 2550 ml  Net -2310 ml   Filed Weights   06/28/21 1537 06/29/21 1012  Weight: 66 kg 72.4 kg    Examination:  General exam: Appears calm and comfortable  Respiratory system: Clear to auscultation. Respiratory effort normal. Cardiovascular system: S1 & S2 heard, RRR. No JVD, murmurs, rubs, gallops or clicks.  Gastrointestinal system: Abdomen is nondistended, soft and nontender. No organomegaly or masses felt. Normal bowel sounds heard. Central nervous system: Alert and oriented. No focal neurological deficits. Extremities: Massive lymphedema bilaterally with +2-3 pitting edema. Psychiatry: Judgement and insight appear normal. Mood & affect appropriate.    Data Reviewed: I have personally reviewed following labs and imaging studies  CBC: Recent Labs  Lab 06/28/21 1710 06/29/21 0428  WBC 3.7* 4.0  NEUTROABS 2.4  --   HGB 10.4* 10.4*  HCT 33.2* 33.5*  MCV 89.0 90.1  PLT 124* 119*   Basic Metabolic Panel: Recent Labs  Lab 06/28/21 1710  NA 141  K 3.9  CL 105  CO2 27  GLUCOSE 82  BUN 9  CREATININE 0.86  CALCIUM 8.6*   GFR: Estimated Creatinine Clearance: 49.8 mL/min (by C-G formula based on SCr of 0.86 mg/dL). Liver Function Tests: No results for input(s): AST, ALT, ALKPHOS, BILITOT, PROT, ALBUMIN in the last 168 hours. No results for input(s): LIPASE, AMYLASE in the last 168 hours. No results for input(s): AMMONIA in the last 168 hours. Coagulation Profile: No results for input(s): INR, PROTIME in the last 168 hours. Cardiac Enzymes: No results for input(s): CKTOTAL, CKMB, CKMBINDEX, TROPONINI in the last 168 hours. BNP (last 3 results) No results for input(s): PROBNP in the last 8760 hours. HbA1C: No results for input(s): HGBA1C in the last 72 hours. CBG: No results for input(s): GLUCAP in the last 168 hours. Lipid Profile: No results for input(s): CHOL, HDL, LDLCALC,  TRIG, CHOLHDL, LDLDIRECT in the last 72 hours. Thyroid Function Tests: No results for input(s): TSH, T4TOTAL, FREET4, T3FREE, THYROIDAB in the last 72 hours. Anemia Panel: No results for input(s): VITAMINB12, FOLATE, FERRITIN, TIBC, IRON, RETICCTPCT in the last 72 hours. Sepsis Labs: No results for input(s): PROCALCITON, LATICACIDVEN in the last 168 hours.  No results found for this or any previous visit (from the past 240 hour(s)).   Radiology Studies: DG Chest 2 View  Result Date: 06/28/2021 CLINICAL DATA:  Bilateral lower extremity swelling x4 days. EXAM: CHEST - 2 VIEW COMPARISON:  April 16, 2021 FINDINGS: The cardiac silhouette is mildly enlarged and unchanged in size. There is mild to moderate severity calcification of the thoracic aorta. Mild, stable, diffusely increased lung markings are seen. There is no evidence of focal consolidation, pleural effusion or pneumothorax. The visualized skeletal structures are unremarkable. IMPRESSION: Stable cardiomegaly  without acute or active cardiopulmonary disease. Electronically Signed   By: Virgina Norfolk M.D.   On: 06/28/2021 19:37    Scheduled Meds:  aspirin EC  81 mg Oral Q lunch   enoxaparin (LOVENOX) injection  40 mg Subcutaneous Q24H   furosemide  40 mg Intravenous Daily   metoprolol succinate  25 mg Oral Daily   potassium chloride SA  40 mEq Oral Daily   Continuous Infusions:   LOS: 0 days   Darliss Cheney, MD [facility]  06/29/2021, 10:39 AM   *Please note that this is a verbal dictation therefore any spelling or grammatical errors are due to the "[place] One" system interpretation.  Please page via [place] and do not message via secure chat for urgent patient care matters. Secure chat can be used for non urgent patient care matters.  How to contact the [facility] Attending or Consulting provider [place] or covering provider during after hours [place], for this patient?  Check the care team in [facility] and look for a)  attending/consulting TRH provider listed and b) the [facility] team listed. Page or secure chat [id number]. Log into [email or web address] and use [place]'s universal password to access. If you do not have the password, please contact the hospital operator. Locate the [facility] provider you are looking for under [facility] and page to a number that you can be directly reached. If you still have difficulty reaching the provider, please page the [facility] (Director on Call) for the Hospitalists listed on amion for assistance.

## 2021-06-29 NOTE — TOC Progression Note (Addendum)
Transition of Care [facility]) - Progression Note    Patient Details  Name: CATELYNN SPARGER MRN: [id number] Date of Birth: Jan 03, 1946  [facility] CM/SW Contact  Zenon [place], RN Phone Number: 06/29/2021, 3:27 PM  Clinical Narrative:    NCM spoke with patient, she is active with [facility] with [facility], HHPT, she lives at [facility] in [place].  She has a walker at home.  She states one of her children will transport her home at dc.  She has a PCP.   Await pt eval.  TOC will continue to follow for dc needs.   NCM spoke with patient daughter Maudie Mercury at [phone], she states patient will really need a SNF, she can't really walk well with her lymphedema.  Also she will continue to get lymphedema txt from where she was getting it before after she comes from SNF.  PT eval is rec SNF for patient.  CSW is aware.         Expected Discharge Plan and Services                                                 Social Determinants of Health (SDOH) Interventions    Readmission Risk Interventions     View : No data to display.

## 2021-06-30 DIAGNOSIS — D696 Thrombocytopenia, unspecified: Secondary | ICD-10-CM | POA: Diagnosis present

## 2021-06-30 DIAGNOSIS — Z825 Family history of asthma and other chronic lower respiratory diseases: Secondary | ICD-10-CM | POA: Diagnosis not present

## 2021-06-30 DIAGNOSIS — I890 Lymphedema, not elsewhere classified: Secondary | ICD-10-CM

## 2021-06-30 DIAGNOSIS — I2510 Atherosclerotic heart disease of native coronary artery without angina pectoris: Secondary | ICD-10-CM | POA: Diagnosis present

## 2021-06-30 DIAGNOSIS — Z882 Allergy status to sulfonamides status: Secondary | ICD-10-CM | POA: Diagnosis not present

## 2021-06-30 DIAGNOSIS — Z87891 Personal history of nicotine dependence: Secondary | ICD-10-CM | POA: Diagnosis not present

## 2021-06-30 DIAGNOSIS — I5082 Biventricular heart failure: Secondary | ICD-10-CM | POA: Diagnosis present

## 2021-06-30 DIAGNOSIS — Z9049 Acquired absence of other specified parts of digestive tract: Secondary | ICD-10-CM | POA: Diagnosis not present

## 2021-06-30 DIAGNOSIS — Z79899 Other long term (current) drug therapy: Secondary | ICD-10-CM | POA: Diagnosis not present

## 2021-06-30 DIAGNOSIS — Z91040 Latex allergy status: Secondary | ICD-10-CM | POA: Diagnosis not present

## 2021-06-30 DIAGNOSIS — J449 Chronic obstructive pulmonary disease, unspecified: Secondary | ICD-10-CM | POA: Diagnosis present

## 2021-06-30 DIAGNOSIS — I5033 Acute on chronic diastolic (congestive) heart failure: Secondary | ICD-10-CM | POA: Diagnosis present

## 2021-06-30 DIAGNOSIS — Z885 Allergy status to narcotic agent status: Secondary | ICD-10-CM | POA: Diagnosis not present

## 2021-06-30 DIAGNOSIS — Z91148 Patient's other noncompliance with medication regimen for other reason: Secondary | ICD-10-CM | POA: Diagnosis not present

## 2021-06-30 DIAGNOSIS — Z90710 Acquired absence of both cervix and uterus: Secondary | ICD-10-CM | POA: Diagnosis not present

## 2021-06-30 DIAGNOSIS — Z8249 Family history of ischemic heart disease and other diseases of the circulatory system: Secondary | ICD-10-CM | POA: Diagnosis not present

## 2021-06-30 DIAGNOSIS — Z85828 Personal history of other malignant neoplasm of skin: Secondary | ICD-10-CM | POA: Diagnosis not present

## 2021-06-30 DIAGNOSIS — Z8673 Personal history of transient ischemic attack (TIA), and cerebral infarction without residual deficits: Secondary | ICD-10-CM | POA: Diagnosis not present

## 2021-06-30 DIAGNOSIS — R531 Weakness: Secondary | ICD-10-CM | POA: Diagnosis present

## 2021-06-30 DIAGNOSIS — Z7982 Long term (current) use of aspirin: Secondary | ICD-10-CM | POA: Diagnosis not present

## 2021-06-30 DIAGNOSIS — I110 Hypertensive heart disease with heart failure: Secondary | ICD-10-CM | POA: Diagnosis present

## 2021-06-30 DIAGNOSIS — I509 Heart failure, unspecified: Secondary | ICD-10-CM | POA: Diagnosis present

## 2021-06-30 DIAGNOSIS — Z888 Allergy status to other drugs, medicaments and biological substances status: Secondary | ICD-10-CM | POA: Diagnosis not present

## 2021-06-30 LAB — BASIC METABOLIC PANEL
Anion gap: 7 (ref 5–15)
BUN: 7 mg/dL — ABNORMAL LOW (ref 8–23)
CO2: 31 mmol/L (ref 22–32)
Calcium: 8.1 mg/dL — ABNORMAL LOW (ref 8.9–10.3)
Chloride: 102 mmol/L (ref 98–111)
Creatinine, Ser: 0.91 mg/dL (ref 0.44–1.00)
GFR, Estimated: >60 mL/min (ref >60)
Glucose, Bld: 79 mg/dL (ref 70–99)
Potassium: 3.3 mmol/L — ABNORMAL LOW (ref 3.5–5.1)
Sodium: 140 mmol/L (ref 135–145)

## 2021-06-30 MED ORDER — MENTHOL 3 MG MT LOZG: 1.0000 | LOZENGE | OROMUCOSAL | Status: DC | PRN

## 2021-06-30 NOTE — Consult Note (Addendum)
[place] Nurse Consult Note: Reason for Consult: Consult requested for bilat legs.  Performed remotely after review of progress notes and photos in the EMR.  Pt is familiar to [facility] team from previous admission on 3/31.  Pt has chronic lymphedema to BLE and has a person who performs lymphedema massage prior to admission. Pt has generalized edema and erythremia and patchy areas of red moist partial thickness wounds to bilat lower legs and feet. Dressing procedure/placement/frequency: Topical treatment orders provided for bedside nurses to perform as follows: Apply xerform gauze to wounds on bilat legs Q day, then cover with kerlex and ace wrap, beginning just behind toes to middle calf. Please re-consult if further assistance is needed.  Thank-you,  Julien Girt MSN, [place], [place], [place], [place]

## 2021-06-30 NOTE — Progress Notes (Signed)
PROGRESS NOTE    Jeanne Lawson  LZJ:[id number] DOB: 05/01/1945 DOA: 06/28/2021 PCP: [name], MD   Brief Narrative:  HPI: Jeanne Lawson is a 76 y.o. female with medical history significant of chronic diastolic CHF, CVA, chronic severe lymphedema, COPD and hypertension who presents with worsening swelling of the lower extremity.   Initially consulted by ED  because patient has been having increasing lower extremity swelling for the past few days as well as orthopnea, increasing shortness of breath and weakness.  However patient denies any shortness of breath or any weakness.  Reports she was still able to carry her own groceries into the house today.  She says her lower extremity has started weeping recently.  She has tried to take her medications daily but there could be a chance that she misses her Lasix sometimes.    She was hospitalized back in March for acute on chronic diastolic CHF with predominantly right heart failure.  She was discharged home on 60 mg Lasix daily and referred to lymphedema clinic.  States she had an appointment that was canceled by the lymphedema clinic and has not yet gone.  Also reports that she has never taken up to 60 mg of Lasix and is currently on 30 mg.   In the ED, she was afebrile normotensive on room air.  Has leukocytosis of 3.7K, hemoglobin at baseline at 10.4.  Thrombocytopenia at baseline around 124.  BMP unremarkable. BNP of 367 from a prior of 303.   UA is negative.  Assessment & Plan:   Principal Problem:   Lymphedema Active Problems:   Essential hypertension   History of CVA (cerebrovascular accident)   Chronic diastolic CHF (congestive heart failure) (HCC)   Acute on chronic diastolic (congestive) heart failure (HCC)  Acute on chronic diastolic congestive heart failure/bilateral lymphedema: Worsening lower extremity lymphedema.  Patient states that she does not weigh herself as she does not have a scale and she has moved recently to  different place.  She has been taking only 30 mg of p.o. Lasix whereas she was instructed to take 60 mg at the time of discharge during recent hospitalization in March.  No shortness of breath, chest x-ray negative for vascular congestion.  Lungs clear to auscultation.  She has had great diuresis of 3.5 L out and just last 24 hours.  Edema in bilateral lower extremity has improved as well but patient states that she thinks she needs more diuresis and she is not ready for discharge today.  She is also requesting that someone take care of the wounds in the legs.  I am going to consult wound care for that.  We will continue IV Lasix 40 mg twice daily.  History of CVA: Continue aspirin  Essential hypertension: Controlled.  Continue metoprolol.  Generalized weakness/disposition: Patient was assessed by PT OT yesterday, patient only walked in the room and did not walk in the hallway.  PT OT recommended home health PT.  When discussed with the patient today about those recommendations, patient believes that she is too weak to go home and that she should be going to SNF.  Discussed with the primary RN who is going to reach out to the PT to reassess patient.       DVT prophylaxis: enoxaparin (LOVENOX) injection 40 mg Start: 06/28/21 2015   Code Status: Full Code  Family Communication:  None present at bedside.  Plan of care discussed with patient in length and he/she verbalized understanding and agreed with  it.  Status is: Inpatient Remains inpatient appropriate because: Still requires IV Lasix.     Estimated body mass index is 29.66 kg/m as calculated from the following:   Height as of this encounter: '5\' 1"'$  (1.549 m).   Weight as of this encounter: 71.2 kg.    Nutritional Assessment: Body mass index is 29.66 kg/m.Marland Kitchen Seen by dietician.  I agree with the assessment and plan as outlined below: Nutrition Status:        . Skin Assessment: I have examined the patient's skin and I agree with  the wound assessment as performed by the wound care RN as outlined below:    Consultants:  None  Procedures:  None  Antimicrobials:  Anti-infectives (From admission, onward)    None         Subjective:  Seen and examined.  She still complains of bilateral lower extremity edema, weakness.  Does not feel ready to go home today.  Objective: Vitals:   06/29/21 2040 06/30/21 0233 06/30/21 0500 06/30/21 0900  BP: (!) 112/49 (!) 108/49 126/62 (!) 129/53  Pulse: 66 65 70 69  Resp: '18 18 18 18  '$ Temp: 98.9 F (37.2 C) 98.9 F (37.2 C) 98 F (36.7 C) 98.1 F (36.7 C)  TempSrc: Oral Oral Oral Oral  SpO2: 94% 92% 94% 96%  Weight:  71.2 kg    Height:        Intake/Output Summary (Last 24 hours) at 06/30/2021 1136 Last data filed at 06/30/2021 0718 Gross per 24 hour  Intake 178 ml  Output 2750 ml  Net -2572 ml    Filed Weights   06/28/21 1537 06/29/21 1012 06/30/21 0233  Weight: 66 kg 72.4 kg 71.2 kg    Examination:  General exam: Appears calm and comfortable  Respiratory system: Clear to auscultation. Respiratory effort normal. Cardiovascular system: S1 & S2 heard, RRR. No JVD, murmurs, rubs, gallops or clicks. No pedal edema. Gastrointestinal system: Abdomen is nondistended, soft and nontender. No organomegaly or masses felt. Normal bowel sounds heard. Central nervous system: Alert and oriented. No focal neurological deficits. Extremities: Bilateral lower extremity massive lymphedema. Psychiatry: Judgement and insight appear normal. Mood & affect appropriate.   Data Reviewed: I have personally reviewed following labs and imaging studies  CBC: Recent Labs  Lab 06/28/21 1710 06/29/21 0428  WBC 3.7* 4.0  NEUTROABS 2.4  --   HGB 10.4* 10.4*  HCT 33.2* 33.5*  MCV 89.0 90.1  PLT 124* 121*    Basic Metabolic Panel: Recent Labs  Lab 06/28/21 1710 06/30/21 0337  NA 141 140  K 3.9 3.3*  CL 105 102  CO2 27 31  GLUCOSE 82 79  BUN 9 7*  CREATININE 0.86 0.91   CALCIUM 8.6* 8.1*    GFR: Estimated Creatinine Clearance: 46.8 mL/min (by C-G formula based on SCr of 0.91 mg/dL). Liver Function Tests: No results for input(s): "AST", "ALT", "ALKPHOS", "BILITOT", "PROT", "ALBUMIN" in the last 168 hours. No results for input(s): "LIPASE", "AMYLASE" in the last 168 hours. No results for input(s): "AMMONIA" in the last 168 hours. Coagulation Profile: No results for input(s): "INR", "PROTIME" in the last 168 hours. Cardiac Enzymes: No results for input(s): "CKTOTAL", "CKMB", "CKMBINDEX", "TROPONINI" in the last 168 hours. BNP (last 3 results) No results for input(s): "PROBNP" in the last 8760 hours. HbA1C: No results for input(s): "HGBA1C" in the last 72 hours. CBG: No results for input(s): "GLUCAP" in the last 168 hours. Lipid Profile: No results for input(s): "CHOL", "HDL", "LDLCALC", "  TRIG", "CHOLHDL", "LDLDIRECT" in the last 72 hours. Thyroid Function Tests: No results for input(s): "TSH", "T4TOTAL", "FREET4", "T3FREE", "THYROIDAB" in the last 72 hours. Anemia Panel: No results for input(s): "VITAMINB12", "FOLATE", "FERRITIN", "TIBC", "IRON", "RETICCTPCT" in the last 72 hours. Sepsis Labs: No results for input(s): "PROCALCITON", "LATICACIDVEN" in the last 168 hours.  No results found for this or any previous visit (from the past 240 hour(s)).   Radiology Studies: DG Chest 2 View  Result Date: 06/28/2021 CLINICAL DATA:  Bilateral lower extremity swelling x4 days. EXAM: CHEST - 2 VIEW COMPARISON:  April 16, 2021 FINDINGS: The cardiac silhouette is mildly enlarged and unchanged in size. There is mild to moderate severity calcification of the thoracic aorta. Mild, stable, diffusely increased lung markings are seen. There is no evidence of focal consolidation, pleural effusion or pneumothorax. The visualized skeletal structures are unremarkable. IMPRESSION: Stable cardiomegaly without acute or active cardiopulmonary disease. Electronically Signed   By:  Virgina Norfolk M.D.   On: 06/28/2021 19:37    Scheduled Meds:  aspirin EC  81 mg Oral Q lunch   enoxaparin (LOVENOX) injection  40 mg Subcutaneous Q24H   furosemide  40 mg Intravenous BID   metoprolol succinate  25 mg Oral Daily   potassium chloride SA  40 mEq Oral Daily   Continuous Infusions:   LOS: 0 days   Darliss Cheney, MD [facility]  06/30/2021, 11:36 AM   *Please note that this is a verbal dictation therefore any spelling or grammatical errors are due to the "[place] One" system interpretation.  Please page via [place] and do not message via secure chat for urgent patient care matters. Secure chat can be used for non urgent patient care matters.  How to contact the [facility] Attending or Consulting provider [place] or covering provider during after hours [place], for this patient?  Check the care team in [facility] and look for a) attending/consulting TRH provider listed and b) the [facility] team listed. Page or secure chat [phone]. Log into [email or web address] and use [place]'s universal password to access. If you do not have the password, please contact the hospital operator. Locate the [facility] provider you are looking for under [facility] and page to a number that you can be directly reached. If you still have difficulty reaching the provider, please page the [facility] (Director on Call) for the Hospitalists listed on amion for assistance.

## 2021-06-30 NOTE — Progress Notes (Signed)
Physical Therapy Treatment Patient Details Name: Jeanne Lawson MRN: [id number] DOB: 11/03/1945 Today's Date: 06/30/2021   History of Present Illness 76 y/o female presented to ED on 06/28/21 for B LE swelling and weeping over 4 days. Admitted for worsening lymphedema. PMH: COPD, CHF, CVA, chronic severe lymphedema, HTN    PT Comments    Pt is progressing slowly towards PT goals. Pt currently min guard with ambulation for safety with a RW. Pt received on BSC and is min guard for sit to stand transfer with RW for safety. Pt tolerated 120 ft of gait progression inside room, declined hallway ambulation due to Lasix and was min guard for ambulation for safety. Pt stated daughter Joelene Millin is working on discharge plans. Pt continues to present with deficits in balance, power, and activity tolerance, but is generally able to manage within-home distances with minG for safety at this time. Given her progress, continue to recommend HHPT with intermittent supervision/assist. The pt could benefit from aide to supervise ADLs and some mobiltiy or exercise in the home in addition to HHPT and pt states agreement with this plan during the session.  Pt would continue to benefit from skilled PT to increase independence and mobility for discharge. Will continue to follow acutely.   Recommendations for follow up therapy are one component of a multi-disciplinary discharge planning process, led by the attending physician.  Recommendations may be updated based on patient status, additional functional criteria and insurance authorization.  Follow Up Recommendations  Home health PT     Assistance Recommended at Discharge Intermittent Supervision/Assistance  Patient can return home with the following A little help with walking and/or transfers;A little help with bathing/dressing/bathroom;Assistance with cooking/housework;Assist for transportation   Equipment Recommendations  None recommended by PT    Recommendations for  Other Services       Precautions / Restrictions Precautions Precautions: Fall Precaution Comments: blisters/wounds/increased swelling on BLE Restrictions Weight Bearing Restrictions: No     Mobility  Bed Mobility Overal bed mobility: Needs Assistance Bed Mobility: Sit to Supine       Sit to supine: Min assist   General bed mobility comments: min A to return to supine with LE managment    Transfers Overall transfer level: Needs assistance   Transfers: Sit to/from Stand                  Ambulation/Gait Ambulation/Gait assistance: Min guard Gait Distance (Feet): 120 Feet Assistive device: Rolling walker (2 wheels)     Gait velocity interpretation: <1.31 ft/sec, indicative of household ambulator   General Gait Details: ambulated to/from door. patient declined hallway ambulation due to recently receiving lasix and has frequently been urinating. Min guard for safety.   Stairs             Wheelchair Mobility    Modified Rankin (Stroke Patients Only)       Balance Overall balance assessment: Needs assistance Sitting-balance support: Feet supported, No upper extremity supported Sitting balance-Leahy Scale: Good Sitting balance - Comments: could reach for tissues and rag outside of BOS without LOB.   Standing balance support: During functional activity Standing balance-Leahy Scale: Fair Standing balance comment: pt able to let go of RW while standing and talk with her hands without LOB.                            Cognition Arousal/Alertness: Awake/alert Behavior During Therapy: WFL for tasks assessed/performed Overall Cognitive Status: Impaired/Different from  baseline Area of Impairment: Memory, Problem solving, Attention                   Current Attention Level: Selective Memory: Decreased short-term memory       Problem Solving: Slow processing, Requires verbal cues          Exercises      General Comments  General comments (skin integrity, edema, etc.): Pt with weeping on BLE      Pertinent Vitals/Pain Pain Assessment Pain Assessment: Faces Faces Pain Scale: Hurts a little bit Pain Location: BLE Pain Descriptors / Indicators: Aching, Discomfort Pain Intervention(s): Monitored during session, Patient requesting pain meds-RN notified    Home Living Family/patient expects to be discharged to:: Private residence Living Arrangements: Alone Available Help at Discharge: Family;Friend(s);Available 24 hours/day Type of Home: Apartment Home Access: Elevator       Home Layout: One level Home Equipment: Wheelchair - Publishing copy (2 wheels);Cane - single point;Shower seat;Toilet riser;Hand held shower head;Grab bars - tub/shower;Grab bars - toilet Additional Comments: [employer] apartments    Prior Function            PT Goals (current goals can now be found in the care plan section) Acute Rehab PT Goals Patient Stated Goal: to go home PT Goal Formulation: With patient Time For Goal Achievement: 07/13/21 Potential to Achieve Goals: Good Progress towards PT goals: Progressing toward goals    Frequency    Min 3X/week      PT Plan      Co-evaluation              AM-PAC PT "6 Clicks" Mobility   Outcome Measure  Help needed turning from your back to your side while in a flat bed without using bedrails?: A Little Help needed moving from lying on your back to sitting on the side of a flat bed without using bedrails?: A Lot Help needed moving to and from a bed to a chair (including a wheelchair)?: A Little Help needed standing up from a chair using your arms (e.g., wheelchair or bedside chair)?: A Little Help needed to walk in hospital room?: A Little Help needed climbing 3-5 steps with a railing? : A Little 6 Click Score: 17    End of Session   Activity Tolerance: Patient tolerated treatment well Patient left: in bed;with call bell/phone within reach Nurse  Communication: Mobility status PT Visit Diagnosis: Unsteadiness on feet (R26.81);Muscle weakness (generalized) (M62.81);Other abnormalities of gait and mobility (R26.89)     Time: 3710-6269 PT Time Calculation (min) (ACUTE ONLY): 32 min  Charges:  $Therapeutic Exercise: 8-22 mins $Therapeutic Activity: 8-22 mins                     Havery Moros, MS, [place] Acute Rehabilitation Services Office: [place] 06/30/2021, 1:43 PM

## 2021-06-30 NOTE — Evaluation (Signed)
Occupational Therapy Evaluation Patient Details Name: Jeanne Lawson MRN: [id number] DOB: August 04, 1945 Today's Date: 06/30/2021   History of Present Illness 76 y/o female presented to ED on 06/28/21 for B LE swelling and weeping over 4 days. Admitted for worsening lymphedema. PMH: COPD, CHF, CVA, chronic severe lymphedema, HTN   Clinical Impression   Patient admitted for the diagnosis above.  PTA she lives in independent apartments with common dinning area.  Patient has a microwave, and was completing her own ADL/iADL.  Patient has recently been limiting mobility to the dinning room due to pain in her feet.  OT will continue efforts in the acute setting.  Patient is unsure what her discharge plans on, she is deferring to her daughters'.  Hh OT can be considered if the patient is agreeable.        Recommendations for follow up therapy are one component of a multi-disciplinary discharge planning process, led by the attending physician.  Recommendations may be updated based on patient status, additional functional criteria and insurance authorization.   Follow Up Recommendations   (Can consider HH OT if patient agrees.)    Assistance Recommended at Discharge Intermittent Supervision/Assistance  Patient can return home with the following      Functional Status Assessment  Patient has had a recent decline in their functional status and demonstrates the ability to make significant improvements in function in a reasonable and predictable amount of time.  Equipment Recommendations  None recommended by OT    Recommendations for Other Services       Precautions / Restrictions Precautions Precautions: Fall Precaution Comments: blisters/wounds/increased swelling on BLE Restrictions Weight Bearing Restrictions: No      Mobility Bed Mobility Overal bed mobility: Needs Assistance Bed Mobility: Supine to Sit, Sit to Supine     Supine to sit: Min assist, HOB elevated Sit to supine: Min assist,  HOB elevated   General bed mobility comments: assist with legs as needed    Transfers Overall transfer level: Needs assistance Equipment used: Rolling walker (2 wheels), None Transfers: Sit to/from Stand Sit to Stand: Supervision                  Balance Overall balance assessment: Needs assistance   Sitting balance-Leahy Scale: Good     Standing balance support: Reliant on assistive device for balance Standing balance-Leahy Scale: Fair                             ADL either performed or assessed with clinical judgement   ADL       Grooming: Wash/dry hands;Supervision/safety;Standing           Upper Body Dressing : Set up;Sitting   Lower Body Dressing: Supervision/safety;Sit to/from stand   Toilet Transfer: Regular Toilet;Rolling walker (2 wheels);Supervision/safety                   Vision Patient Visual Report: No change from baseline       Perception Perception Perception: Within Functional Limits   Praxis Praxis Praxis: Intact    Pertinent Vitals/Pain Pain Assessment Pain Assessment: Faces Faces Pain Scale: Hurts little more Pain Location: feet Pain Descriptors / Indicators: Burning, Tingling Pain Intervention(s): Monitored during session     Hand Dominance Right   Extremity/Trunk Assessment Upper Extremity Assessment Upper Extremity Assessment: Overall WFL for tasks assessed   Lower Extremity Assessment Lower Extremity Assessment: Defer to PT evaluation   Cervical / Trunk Assessment  Cervical / Trunk Assessment: Kyphotic   Communication Communication Communication: No difficulties   Cognition Arousal/Alertness: Awake/alert Behavior During Therapy: WFL for tasks assessed/performed Overall Cognitive Status: Within Functional Limits for tasks assessed                       Memory: Decreased short-term memory       Problem Solving: Requires verbal cues       General Comments  Pt with weeping on  BLE    Exercises     Shoulder Instructions      Home Living Family/patient expects to be discharged to:: Private residence Living Arrangements: Alone Available Help at Discharge: Family;Friend(s);Available 24 hours/day Type of Home: Apartment Home Access: Elevator     Home Layout: One level     Bathroom Shower/Tub: Occupational psychologist: Handicapped height Bathroom Accessibility: Yes How Accessible: Accessible via wheelchair Home Equipment: Wheelchair - Publishing copy (2 wheels);Cane - single point;Shower seat;Toilet riser;Hand held shower head;Grab bars - tub/shower;Grab bars - toilet   Additional Comments: [employer] apartments      Prior Functioning/Environment Prior Level of Function : Independent/Modified Independent             Mobility Comments: uses RW for mobility.  Able to walk to common dining room.  Will use microwave in apt as needed. ADLs Comments: Independent ADL, and light iADL.   [place] provides transportation to appointments.        OT Problem List: Decreased strength;Decreased activity tolerance;Impaired balance (sitting and/or standing);Pain      OT Treatment/Interventions: Self-care/ADL training;Therapeutic activities;Patient/family education;Balance training    OT Goals(Current goals can be found in the care plan section) Acute Rehab OT Goals Patient Stated Goal: Patient is deferring d/c plan to daughter's OT Goal Formulation: With patient Time For Goal Achievement: 07/14/21 Potential to Achieve Goals: Good ADL Goals Pt Will Perform Grooming: with modified independence;standing Pt Will Perform Lower Body Dressing: with modified independence;sit to/from stand Pt Will Transfer to Toilet: with modified independence;ambulating;regular height toilet  OT Frequency: Min 2X/week    Co-evaluation              AM-PAC OT "6 Clicks" Daily Activity     Outcome Measure Help from another person eating meals?:  None Help from another person taking care of personal grooming?: None Help from another person toileting, which includes using toliet, bedpan, or urinal?: A Little Help from another person bathing (including washing, rinsing, drying)?: A Little Help from another person to put on and taking off regular upper body clothing?: None Help from another person to put on and taking off regular lower body clothing?: A Little 6 Click Score: 21   End of Session Equipment Utilized During Treatment: Rolling walker (2 wheels) Nurse Communication: Mobility status  Activity Tolerance: Patient tolerated treatment well Patient left: in bed;with call bell/phone within reach  OT Visit Diagnosis: Unsteadiness on feet (R26.81);Pain;Muscle weakness (generalized) (M62.81) Pain - Right/Left: Right Pain - part of body: Ankle and joints of foot                Time: 1130-1148 OT Time Calculation (min): 18 min Charges:  OT General Charges $OT Visit: 1 Visit OT Evaluation $OT Eval Moderate Complexity: 1 Mod  06/30/2021  RP, OTR/L  Acute Rehabilitation Services  Office:  [phone]   Metta Clines 06/30/2021, 1:23 PM

## 2021-07-01 LAB — BASIC METABOLIC PANEL
Anion gap: 9 (ref 5–15)
BUN: 8 mg/dL (ref 8–23)
CO2: 28 mmol/L (ref 22–32)
Calcium: 8.2 mg/dL — ABNORMAL LOW (ref 8.9–10.3)
Chloride: 98 mmol/L (ref 98–111)
Creatinine, Ser: 0.94 mg/dL (ref 0.44–1.00)
GFR, Estimated: >60 mL/min (ref >60)
Glucose, Bld: 112 mg/dL — ABNORMAL HIGH (ref 70–99)
Potassium: 3.7 mmol/L (ref 3.5–5.1)
Sodium: 135 mmol/L (ref 135–145)

## 2021-07-01 LAB — CBC
HCT: 37.4 % (ref 36.0–46.0)
Hemoglobin: 12 g/dL (ref 12.0–15.0)
MCH: 28.3 pg (ref 26.0–34.0)
MCHC: 32.1 g/dL (ref 30.0–36.0)
MCV: 88.2 fL (ref 80.0–100.0)
Platelets: 150 10*3/uL (ref 150–400)
RBC: 4.24 MIL/uL (ref 3.87–5.11)
RDW: 15.4 % (ref 11.5–15.5)
WBC: 4.4 10*3/uL (ref 4.0–10.5)
nRBC: 0 % (ref 0.0–0.2)

## 2021-07-01 NOTE — Progress Notes (Signed)
PROGRESS NOTE    Jeanne Lawson  ZOX:[id number] DOB: [birth year] DOA: 06/28/2021 PCP: [name], MD   Brief Narrative:  HPI: Jeanne Lawson is a 76 y.o. female with medical history significant of chronic diastolic CHF, CVA, chronic severe lymphedema, COPD and hypertension who presents with worsening swelling of the lower extremity.   Initially consulted by ED  because patient has been having increasing lower extremity swelling for the past few days as well as orthopnea, increasing shortness of breath and weakness.  However patient denies any shortness of breath or any weakness.  Reports she was still able to carry her own groceries into the house today.  She says her lower extremity has started weeping recently.  She has tried to take her medications daily but there could be a chance that she misses her Lasix sometimes.    She was hospitalized back in March for acute on chronic diastolic CHF with predominantly right heart failure.  She was discharged home on 60 mg Lasix daily and referred to lymphedema clinic.  States she had an appointment that was canceled by the lymphedema clinic and has not yet gone.  Also reports that she has never taken up to 60 mg of Lasix and is currently on 30 mg.   In the ED, she was afebrile normotensive on room air.  Has leukocytosis of 3.7K, hemoglobin at baseline at 10.4.  Thrombocytopenia at baseline around 124.  BMP unremarkable. BNP of 367 from a prior of 303.   UA is negative.  Assessment & Plan:   Principal Problem:   Lymphedema Active Problems:   Essential hypertension   History of CVA (cerebrovascular accident)   Chronic diastolic CHF (congestive heart failure) (HCC)   Acute on chronic diastolic (congestive) heart failure (HCC)  Acute on chronic diastolic congestive heart failure/bilateral lymphedema: Worsening lower extremity lymphedema.  Patient states that she does not weigh herself as she does not have a scale and she has moved recently to  different place.  She has been taking only 30 mg of p.o. Lasix whereas she was instructed to take 60 mg at the time of discharge during recent hospitalization in March.  No shortness of breath, chest x-ray negative for vascular congestion.  Lungs clear to auscultation.  She has had great diuresis of 2.6 L out and just last 24 hours and net -6.7 L since admission.  Edema in bilateral lower extremity has improved.  Wound care is taking care of her wounds.  We will continue diuresis as it is.  History of CVA: Continue aspirin  Essential hypertension: Controlled.  Continue metoprolol.  Generalized weakness/disposition: Patient was assessed by PT OT twice in last 2 days and they had recommended home with home health.  Patient reluctant to go home stating that she thinks she needs to go to rehab.  I told her this morning that PT OT recommends home health and that if she were to go to SNF, she will have to pay out-of-pocket.  PT OT reassessed her and now they are recommending SNF.  TOC on board trying to find placement.       DVT prophylaxis: enoxaparin (LOVENOX) injection 40 mg Start: 06/28/21 2015   Code Status: Full Code  Family Communication:  None present at bedside.  Plan of care discussed with patient in length and he/she verbalized understanding and agreed with it.  Also called and discussed with her daughter Maudie Mercury over the phone.  Status is: Inpatient Remains inpatient appropriate because: Still requires IV  Lasix for 1 more day and then SNF bed placement is pending as well.     Estimated body mass index is 27.91 kg/m as calculated from the following:   Height as of this encounter: '5\' 1"'$  (1.549 m).   Weight as of this encounter: 67 kg.    Nutritional Assessment: Body mass index is 27.91 kg/m.Marland Kitchen Seen by dietician.  I agree with the assessment and plan as outlined below: Nutrition Status:        . Skin Assessment: I have examined the patient's skin and I agree with the wound  assessment as performed by the wound care RN as outlined below:    Consultants:  None  Procedures:  None  Antimicrobials:  Anti-infectives (From admission, onward)    None         Subjective:  Seen and examined.  No complaints.  She does not feel like she will be able to take care of her at home as she lives by herself.  Requesting SNF discharge.  Objective: Vitals:   07/01/21 0000 07/01/21 0200 07/01/21 0412 07/01/21 1232  BP:   126/62 (!) 115/48  Pulse:   74 83  Resp:   18 17  Temp:   98.1 F (36.7 C) 98.7 F (37.1 C)  TempSrc:   Oral Oral  SpO2: 98% 95% 93%   Weight:   67 kg   Height:        Intake/Output Summary (Last 24 hours) at 07/01/2021 1404 Last data filed at 06/30/2021 2347 Gross per 24 hour  Intake 480 ml  Output 1950 ml  Net -1470 ml    Filed Weights   06/29/21 1012 06/30/21 0233 07/01/21 0412  Weight: 72.4 kg 71.2 kg 67 kg    Examination:  General exam: Appears calm and comfortable  Respiratory system: Clear to auscultation. Respiratory effort normal. Cardiovascular system: S1 & S2 heard, RRR. No JVD, murmurs, rubs, gallops or clicks.  Gastrointestinal system: Abdomen is nondistended, soft and nontender. No organomegaly or masses felt. Normal bowel sounds heard. Central nervous system: Alert and oriented. No focal neurological deficits. Extremities: Massive lymphedema bilateral lower extremity. Psychiatry: Judgement and insight appear normal. Mood & affect appropriate.   Data Reviewed: I have personally reviewed following labs and imaging studies  CBC: Recent Labs  Lab 06/28/21 1710 06/29/21 0428 07/01/21 0918  WBC 3.7* 4.0 4.4  NEUTROABS 2.4  --   --   HGB 10.4* 10.4* 12.0  HCT 33.2* 33.5* 37.4  MCV 89.0 90.1 88.2  PLT 124* 121* 099    Basic Metabolic Panel: Recent Labs  Lab 06/28/21 1710 06/30/21 0337 07/01/21 0918  NA 141 140 135  K 3.9 3.3* 3.7  CL 105 102 98  CO2 '27 31 28  '$ GLUCOSE 82 79 112*  BUN 9 7* 8  CREATININE  0.86 0.91 0.94  CALCIUM 8.6* 8.1* 8.2*    GFR: Estimated Creatinine Clearance: 43.9 mL/min (by C-G formula based on SCr of 0.94 mg/dL). Liver Function Tests: No results for input(s): "AST", "ALT", "ALKPHOS", "BILITOT", "PROT", "ALBUMIN" in the last 168 hours. No results for input(s): "LIPASE", "AMYLASE" in the last 168 hours. No results for input(s): "AMMONIA" in the last 168 hours. Coagulation Profile: No results for input(s): "INR", "PROTIME" in the last 168 hours. Cardiac Enzymes: No results for input(s): "CKTOTAL", "CKMB", "CKMBINDEX", "TROPONINI" in the last 168 hours. BNP (last 3 results) No results for input(s): "PROBNP" in the last 8760 hours. HbA1C: No results for input(s): "HGBA1C" in the last 72  hours. CBG: No results for input(s): "GLUCAP" in the last 168 hours. Lipid Profile: No results for input(s): "CHOL", "HDL", "LDLCALC", "TRIG", "CHOLHDL", "LDLDIRECT" in the last 72 hours. Thyroid Function Tests: No results for input(s): "TSH", "T4TOTAL", "FREET4", "T3FREE", "THYROIDAB" in the last 72 hours. Anemia Panel: No results for input(s): "VITAMINB12", "FOLATE", "FERRITIN", "TIBC", "IRON", "RETICCTPCT" in the last 72 hours. Sepsis Labs: No results for input(s): "PROCALCITON", "LATICACIDVEN" in the last 168 hours.  No results found for this or any previous visit (from the past 240 hour(s)).   Radiology Studies: No results found.  Scheduled Meds:  aspirin EC  81 mg Oral Q lunch   enoxaparin (LOVENOX) injection  40 mg Subcutaneous Q24H   furosemide  40 mg Intravenous BID   metoprolol succinate  25 mg Oral Daily   potassium chloride SA  40 mEq Oral Daily   Continuous Infusions:   LOS: 1 day   Darliss Cheney, MD [facility]  07/01/2021, 2:04 PM   *Please note that this is a verbal dictation therefore any spelling or grammatical errors are due to the "[place] [employer]" system interpretation.  Please page via [place] and do not message via secure chat for  urgent patient care matters. Secure chat can be used for non urgent patient care matters.  How to contact the [facility] Attending or Consulting provider [place] or covering provider during after hours [place], for this patient?  Check the care team in [facility] and look for a) attending/consulting [facility] provider listed and b) the [facility] team listed. Page or secure chat [phone]. Log into [email or web address] and use [place]'s universal password to access. If you do not have the password, please contact the hospital operator. Locate the [facility] provider you are looking for under [facility] and page to a number that you can be directly reached. If you still have difficulty reaching the provider, please page the [facility] (Director on Call) for the Hospitalists listed on amion for assistance.

## 2021-07-01 NOTE — Plan of Care (Signed)

## 2021-07-01 NOTE — TOC Initial Note (Addendum)
Transition of Care [facility]) - Initial/Assessment Note    Patient Details  Name: Jeanne Lawson MRN: [id number] Date of Birth: 1945-01-28  Transition of Care [facility]) CM/SW Contact:    Bethann Berkshire, [place] Phone Number: 07/01/2021, 2:30 PM  Clinical Narrative:                  CSW notified that pt does not have enough support at ILF and will need SNF. CSW and RNCM met with pt and pt daughter bedside. They are agreeable to SNF workup. CSW explained SNF workup and insurance auth process. Pt has been to [facility] before and prefers there. CSW completed fl2 and faxed bed requests in hub. Will follow up with bed offers.   1500: Still waiting on response from [facility]; have not heard back. CSW provided pt with other facilities who offered. Pt prefers [facility] as 2nd Equities trader as 3rd choice though is hopeful that [facility] can accept. CSW explained Auth would need to be started today for DC tomorrow. She requests CSW update her daughter Nira Conn. Nira Conn prefers [facility] as she lives in [place] and pt has been there before. She is agreeable to alternative choice if [facility] cannot accept.   CSW has not heard from [facility]. Texted [facility] liaison to confirm they cannot accept tomorrow; awaiting response.   Expected Discharge Plan: Skilled Nursing Facility Barriers to Discharge: Ship broker, SNF Pending bed offer   Patient Goals and CMS Choice        Expected Discharge Plan and Services Expected Discharge Plan: [place] arrangements for the past 2 months: [place]                                      Prior Living Arrangements/Services Living arrangements for the past 2 months: [place] Lives with:: Self Patient language and need for interpreter reviewed:: Yes        Need for Family Participation in Patient Care: Yes (Comment) Care giver support system in place?: Yes (comment)   Criminal  Activity/Legal Involvement Pertinent to Current Situation/Hospitalization: No - Comment as needed  Activities of Daily Living      Permission Sought/Granted   Permission granted to share information with : Yes, Verbal Permission Granted  Share Information with NAME: daughters           Emotional Assessment Appearance:: Appears stated age Attitude/Demeanor/Rapport: Engaged Affect (typically observed): Accepting, Appropriate Orientation: : Oriented to Self, Oriented to Place, Oriented to  Time, Oriented to Situation Alcohol / Substance Use: Not Applicable Psych Involvement: No (comment)  Admission diagnosis:  Acute CHF (congestive heart failure) (HCC) [I50.9] Bilateral lower extremity edema [R60.0] Acute on chronic diastolic (congestive) heart failure (HCC) [I50.33] Patient Active Problem List   Diagnosis Date Noted   Acute on chronic diastolic (congestive) heart failure ([place]) 06/30/2021   History of CVA (cerebrovascular accident) 06/28/2021   Chronic diastolic CHF (congestive heart failure) (HCC) 06/28/2021   Lymphedema    Acute on chronic diastolic CHF (congestive heart failure) ([place]) 04/16/2021   COPD (chronic obstructive pulmonary disease) (HCC)    Acute encephalopathy 04/15/2021   Edema of both lower extremities due to peripheral venous insufficiency 07/13/2020   Acute cholecystitis 03/07/2016   Small vessel disease, cerebrovascular 05/31/2015   Aneurysm, cerebral, nonruptured 05/31/2015   Dizziness and giddiness 05/31/2015   Altered mental  status    Acute CVA (cerebrovascular accident) ([place]) 03/15/2015   Dysphasia 03/12/2015   Thrombocytopenia ([place]) 03/12/2015   Hypokalemia    Endotracheally intubated    Essential hypertension    Intracranial aneurysm 11/18/2014   Respiratory failure (HCC)    Cerebral aneurysm    Brain aneurysm    Aphasia    Palmar erythema    CVA (cerebral infarction) 05/02/2014   Aneurysm ([place]) 05/02/2014   Expressive aphasia 05/02/2014    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 [address] [place] [address] Phone: [phone] Fax: [place] [phone] [place]), [place] [address] [place] [address] Phone: [phone] Fax: [phone]  [place], [place] [address] [place] [address] Phone: [phone] Fax: [phone]  [facility] [address] [place] [address] Phone: [phone] Fax: [phone]     Social Determinants of Health (SDOH) Interventions    Readmission Risk Interventions     No data to display

## 2021-07-01 NOTE — Progress Notes (Signed)
Occupational Therapy Treatment Patient Details Name: Jeanne Lawson MRN: [id number] DOB: Nov 01, 1945 Today's Date: 07/01/2021   History of present illness 76 y/o female presented to ED on 06/28/21 for B LE swelling and weeping over 4 days. Admitted for worsening lymphedema. PMH: COPD, CHF, CVA, chronic severe lymphedema, HTN   OT comments  Patient with fair progress toward  patient focused goals.  Patient needing increased assist for lower body ADL compared to evaluation status, but mobility remains [place] at [employer] level.  Patient initially considering [facility] services, but family is unable to provide the 24 hour assist currently needed.  OT updating discharge recommendation to SNF for post acute rehab to maximize independence prior to returning home.  OT to continue efforts in the acute setting.      Recommendations for follow up therapy are one component of a multi-disciplinary discharge planning process, led by the attending physician.  Recommendations may be updated based on patient status, additional functional criteria and insurance authorization.    Follow Up Recommendations  Skilled nursing-short term rehab (<3 hours/day)    Assistance Recommended at Discharge Frequent or constant Supervision/Assistance  Patient can return home with the following  A little help with walking and/or transfers;A lot of help with bathing/dressing/bathroom;Assist for transportation   Equipment Recommendations  None recommended by OT    Recommendations for Other Services      Precautions / Restrictions Precautions Precautions: Fall Precaution Comments: blisters/wounds/increased swelling on BLE Restrictions Weight Bearing Restrictions: No       Mobility Bed Mobility               General bed mobility comments: up in recliner    Transfers Overall transfer level: Needs assistance Equipment used: Rolling walker (2 wheels), None Transfers: Sit to/from Stand Sit to Stand: Min guard                  Balance Overall balance assessment: Needs assistance Sitting-balance support: Feet supported, No upper extremity supported Sitting balance-Leahy Scale: Good     Standing balance support: Reliant on assistive device for balance Standing balance-Leahy Scale: Fair                             ADL either performed or assessed with clinical judgement   ADL       Grooming: Wash/dry hands;Supervision/safety;Standing           Upper Body Dressing : Set up;Sitting   Lower Body Dressing: Minimal assistance;Sit to/from stand Lower Body Dressing Details (indicate cue type and reason): difficulty placing socks due to wraps on feet Toilet Transfer: Regular Toilet;Rolling walker (2 wheels);Set up                  Extremity/Trunk Assessment Upper Extremity Assessment Upper Extremity Assessment: Overall WFL for tasks assessed   Lower Extremity Assessment Lower Extremity Assessment: Defer to PT evaluation   Cervical / Trunk Assessment Cervical / Trunk Assessment: Kyphotic    Vision       Perception     Praxis      Cognition Arousal/Alertness: Awake/alert Behavior During Therapy: WFL for tasks assessed/performed Overall Cognitive Status: Within Functional Limits for tasks assessed  General Comments  VSS on RA    Pertinent Vitals/ Pain       Pain Assessment Faces Pain Scale: Hurts little more Pain Location: feet with walking Pain Descriptors / Indicators: Burning, Tingling Pain Intervention(s): Monitored during session                                                          Frequency  Min 2X/week        Progress Toward Goals  OT Goals(current goals can now be found in the care plan section)     Acute Rehab OT Goals OT Goal Formulation: With patient Time For Goal Achievement: 07/14/21 Potential to Achieve Goals: Good  Plan (P)  Discharge plan needs to be updated    Co-evaluation                 AM-PAC OT "6 Clicks" Daily Activity     Outcome Measure   Help from another person eating meals?: None Help from another person taking care of personal grooming?: None Help from another person toileting, which includes using toliet, bedpan, or urinal?: A Little Help from another person bathing (including washing, rinsing, drying)?: A Lot Help from another person to put on and taking off regular upper body clothing?: A Little Help from another person to put on and taking off regular lower body clothing?: A Lot 6 Click Score: 18    End of Session Equipment Utilized During Treatment: Rolling walker (2 wheels)  OT Visit Diagnosis: Unsteadiness on feet (R26.81);Pain;Muscle weakness (generalized) (M62.81) Pain - Right/Left: Right Pain - part of body: Ankle and joints of foot   Activity Tolerance Patient tolerated treatment well   Patient Left in chair;with call bell/phone within reach   Nurse Communication Mobility status        Time: 0941-1000 OT Time Calculation (min): 19 min  Charges: OT General Charges $OT Visit: 1 Visit OT Treatments $Self Care/Home Management : 8-22 mins  07/01/2021  RP, OTR/L  Acute Rehabilitation Services  Office:  [phone]   Metta Clines 07/01/2021, 12:15 PM

## 2021-07-01 NOTE — Progress Notes (Signed)
Physical Therapy Treatment Patient Details Name: Jeanne Lawson MRN: [id number] DOB: 01-07-1946 Today's Date: 07/01/2021   History of Present Illness 76 y/o female presented to ED on 06/28/21 for B LE swelling and weeping over 4 days. Admitted for worsening lymphedema. PMH: COPD, CHF, CVA, chronic severe lymphedema, HTN    PT Comments    The pt was seen for continued mobility progression and discussion of discharge planning. The pt continues to mobilize on minG-minA level for all transfers and ambulation with use of RW. However, the pt also requires repeated cues for safety and technique with transfers and therefore would need more supervision than is available at her independent living facility. Family and pt in agreement with plan for d/c to short stint SNF rehab to maximize independence prior to return home with aides. The pt will continue to benefit from skilled PT to progress independence with safety and mobility, as well as to improve understanding of self-management strategies and improved adherence to mobility recommendations.    Recommendations for follow up therapy are one component of a multi-disciplinary discharge planning process, led by the attending physician.  Recommendations may be updated based on patient status, additional functional criteria and insurance authorization.  Follow Up Recommendations  Skilled nursing-short term rehab (<3 hours/day)     Assistance Recommended at Discharge Intermittent Supervision/Assistance  Patient can return home with the following A little help with walking and/or transfers;A little help with bathing/dressing/bathroom;Assistance with cooking/housework;Assist for transportation   Equipment Recommendations  BSC/3in1    Recommendations for Other Services       Precautions / Restrictions Precautions Precautions: Fall Precaution Comments: blisters/wounds/increased swelling on BLE Restrictions Weight Bearing Restrictions: No      Mobility  Bed Mobility Overal bed mobility: Needs Assistance Bed Mobility: Sit to Supine       Sit to supine: Min assist   General bed mobility comments: min A to return to supine with LE managment    Transfers Overall transfer level: Needs assistance Equipment used: Rolling walker (2 wheels), None Transfers: Sit to/from Stand Sit to Stand: Min guard, Min assist           General transfer comment: minA at times to control lower to bed, repeated cues for hand placement. minG to complete pivotal steps to [facility]    Ambulation/Gait Ambulation/Gait assistance: Min guard Gait Distance (Feet): 45 Feet (+ 45 ft) Assistive device: Rolling walker (2 wheels) Gait Pattern/deviations: Step-through pattern, Decreased stride length, Trunk flexed, Step-to pattern, Shuffle Gait velocity: decreased Gait velocity interpretation: <1.31 ft/sec, indicative of household ambulator   General Gait Details: ambulated to/from door. patient declined hallway ambulation due to recently receiving lasix and has frequently been urinating. Min guard for safety. cues for increased stride clearance. no overt LOB     Balance Overall balance assessment: Needs assistance Sitting-balance support: Feet supported, No upper extremity supported Sitting balance-Leahy Scale: Good     Standing balance support: No upper extremity supported, During functional activity Standing balance-Leahy Scale: Fair Standing balance comment: static stance without UE support, dynamic gait with UE support                            Cognition Arousal/Alertness: Awake/alert Behavior During Therapy: WFL for tasks assessed/performed Overall Cognitive Status: Impaired/Different from baseline Area of Impairment: Safety/judgement, Problem solving                         Safety/Judgement: Decreased  awareness of safety, Decreased awareness of deficits   Problem Solving: Requires verbal cues General Comments:  verbal cues for safety and technique. pt and daughter disgreeing throughout session regarding pt's level of compliance with education and lymphedema management. Pt able to verbalize her concerns, potentially mildly impaired awareness of condition, but likely baseline        Exercises Other Exercises Other Exercises: repeated sit-stand from EOB. repeated cues, completed x10 with minG and use of UE    General Comments General comments (skin integrity, edema, etc.): daughter present and reporting pt not compliant with any recommendations when pt is at home, pt disagrees. Attempted to reinforce education regarding continued activity and lymphedema management to reduce hospital re-admissions.      Pertinent Vitals/Pain Pain Assessment Pain Assessment: Faces Faces Pain Scale: Hurts a little bit Pain Location: feet with walking Pain Descriptors / Indicators: Burning, Tingling Pain Intervention(s): Limited activity within patient's tolerance, Monitored during session, Repositioned     PT Goals (current goals can now be found in the care plan section) Acute Rehab PT Goals Patient Stated Goal: to be independent at home PT Goal Formulation: With patient Time For Goal Achievement: 07/13/21 Potential to Achieve Goals: Good Progress towards PT goals: Progressing toward goals    Frequency    Min 3X/week      PT Plan Discharge plan needs to be updated       AM-PAC PT "6 Clicks" Mobility   Outcome Measure  Help needed turning from your back to your side while in a flat bed without using bedrails?: A Little Help needed moving from lying on your back to sitting on the side of a flat bed without using bedrails?: A Lot Help needed moving to and from a bed to a chair (including a wheelchair)?: A Little Help needed standing up from a chair using your arms (e.g., wheelchair or bedside chair)?: A Little Help needed to walk in hospital room?: A Little Help needed climbing 3-5 steps with a  railing? : A Little 6 Click Score: 17    End of Session Equipment Utilized During Treatment: Gait belt Activity Tolerance: Patient tolerated treatment well Patient left: in bed;with call bell/phone within reach Nurse Communication: Mobility status PT Visit Diagnosis: Unsteadiness on feet (R26.81);Muscle weakness (generalized) (M62.81);Other abnormalities of gait and mobility (R26.89)     Time: 8416-6063 PT Time Calculation (min) (ACUTE ONLY): 39 min  Charges:  $Gait Training: 8-22 mins $Therapeutic Exercise: 23-37 mins                     [place], PT, DPT   Acute Rehabilitation Department   Sandra Cockayne 07/01/2021, 1:36 PM

## 2021-07-01 NOTE — NC FL2 (Signed)
[place] LEVEL OF CARE SCREENING TOOL     IDENTIFICATION  Patient Name: Jeanne Lawson Birthdate: 11/04/1945 Sex: female Admission Date (Current Location): 06/28/2021  [facility] and [place] Number:  Herbalist and Address:  [facility] [place] [address] [place], [place] [address]      Provider Number: [id number]  Attending Physician Name and Address:  Darliss Cheney, MD  Relative Name and Phone Number:  Gevena Mart (Daughter)   [phone] (Home Phone)    Current Level of Care: Hospital Recommended Level of Care: [place] Prior Approval Number:    Date Approved/Denied:   PASRR Number: [id number]  Discharge Plan: SNF    Current Diagnoses: Patient Active Problem List   Diagnosis Date Noted   Acute on chronic diastolic (congestive) heart failure ([place]) 06/30/2021   History of CVA (cerebrovascular accident) 06/28/2021   Chronic diastolic CHF (congestive heart failure) ([place]) 06/28/2021   Lymphedema    Acute on chronic diastolic CHF (congestive heart failure) ([place]) 04/16/2021   COPD (chronic obstructive pulmonary disease) ([place])    Acute encephalopathy 04/15/2021   Edema of both lower extremities due to peripheral venous insufficiency 07/13/2020   Acute cholecystitis 03/07/2016   Small vessel disease, cerebrovascular 05/31/2015   Aneurysm, cerebral, nonruptured 05/31/2015   Dizziness and giddiness 05/31/2015   Altered mental status    Acute CVA (cerebrovascular accident) ([place]) 03/15/2015   Dysphasia 03/12/2015   Thrombocytopenia ([place]) 03/12/2015   Hypokalemia    Endotracheally intubated    Essential hypertension    Intracranial aneurysm 11/18/2014   Respiratory failure (HCC)    Cerebral aneurysm    Brain aneurysm    Aphasia    Palmar erythema    CVA (cerebral infarction) 05/02/2014   Aneurysm ([place]) 05/02/2014   Expressive aphasia 05/02/2014   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   Chronic ischemic heart  disease 11/13/2011   Tobacco abuse 10/24/2010   TIA (transient ischemic attack)    Hypertension    Ventricular hypertrophy    CVA 02/23/2010   STRESS FRACTURE, FOOT 02/22/2010    Orientation RESPIRATION BLADDER Height & Weight     Self, Time, Situation, Place  Normal Continent, Incontinent Weight: 147 lb 11.3 oz (67 kg) Height:  '5\' 1"'$  (154.9 cm)  BEHAVIORAL SYMPTOMS/MOOD NEUROLOGICAL BOWEL NUTRITION STATUS      Continent Diet (see d/c summary)  AMBULATORY STATUS COMMUNICATION OF NEEDS Skin   Extensive Assist Verbally Other (Comment) (Lymphedema, left and right foot, anterior)                       Personal Care Assistance Level of Assistance  Bathing, Feeding, Dressing Bathing Assistance: Limited assistance Feeding assistance: Independent Dressing Assistance: Limited assistance     Functional Limitations Info  Sight, Hearing, Speech Sight Info: Adequate Hearing Info: Adequate Speech Info: Adequate    SPECIAL CARE FACTORS FREQUENCY  PT (By licensed PT), OT (By licensed OT)     PT Frequency: 5x/week OT Frequency: 5x/week            Contractures Contractures Info: Not present    Additional Factors Info  Code Status, Allergies Code Status Info: Full code Allergies Info: Dilaudid (hydromorphone), latex, Sulfa Drugs Cross Reactors, lidocaine, codeine           Current Medications (07/01/2021):  This is the current hospital active medication list Current Facility-Administered Medications  Medication Dose Route Frequency Provider Last Rate Last Admin   aspirin EC tablet 81 mg  81 mg Oral Q lunch Tu, Ching T, DO   81 mg at 06/30/21 1110   enoxaparin (LOVENOX) injection 40 mg  40 mg Subcutaneous Q24H Tu, Ching T, DO   40 mg at 06/30/21 2223   furosemide (LASIX) injection 40 mg  40 mg Intravenous BID Darliss Cheney, MD   40 mg at 07/01/21 0803   HYDROcodone-acetaminophen (NORCO/VICODIN) 5-325 MG per tablet 0.5 tablet  0.5 tablet Oral Q6H PRN Tu, Ching T, DO   0.5  tablet at 07/01/21 9417   menthol-cetylpyridinium (CEPACOL) lozenge 3 mg  1 lozenge Oral PRN Etta Quill, DO       metoprolol succinate (TOPROL-XL) 24 hr tablet 25 mg  25 mg Oral Daily Tu, Ching T, DO   25 mg at 07/01/21 0908   potassium chloride SA (KLOR-CON M) CR tablet 40 mEq  40 mEq Oral Daily Tu, Ching T, DO   40 mEq at 07/01/21 0908     Discharge Medications: Please see discharge summary for a list of discharge medications.  Relevant Imaging Results:  Relevant Lab Results:   Additional Information SSN [place], [place]

## 2021-07-01 NOTE — TOC Progression Note (Signed)
Transition of Care [facility]) - Progression Note    Patient Details  Name: Jeanne Lawson MRN: [id number] Date of Birth: March 21, 1945  [facility]) CM/SW [place], [place] Phone Number: 07/01/2021, 4:26 PM  Clinical Narrative:     [facility] can accept pt tomorrow pending insurance approval. CSW submitted SNF auth request in [place]; auth pending.   Expected Discharge Plan: Skilled Nursing Facility Barriers to Discharge: Ship broker, SNF Pending bed offer  Expected Discharge Plan and Services Expected Discharge Plan: [place] arrangements for the past 2 months: [place]                                       Social Determinants of Health (SDOH) Interventions    Readmission Risk Interventions     No data to display

## 2021-07-02 NOTE — TOC Progression Note (Signed)
Transition of Care [facility]) - Progression Note    Patient Details  Name: Jeanne Lawson MRN: [id number] Date of Birth: 02/23/1945  Transition of Care [facility]) CM/SW [place], LCSW Phone Number: 07/02/2021, 11:10 AM  Clinical Narrative:     CSW checked Navi portal and pt's authorization is still pending.  TOC team will continue to assist with discharge planning needs.   Expected Discharge Plan: Skilled Nursing Facility Barriers to Discharge: Ship broker, SNF Pending bed offer  Expected Discharge Plan and Services Expected Discharge Plan: [place] arrangements for the past 2 months: [place]                                       Social Determinants of Health (SDOH) Interventions    Readmission Risk Interventions     No data to display

## 2021-07-02 NOTE — Progress Notes (Signed)
PROGRESS NOTE    Jeanne Lawson  PJK:[id number] DOB: [birth year] DOA: 06/28/2021 PCP: [name], MD   Brief Narrative:  HPI: Jeanne Lawson is a 76 y.o. female with medical history significant of chronic diastolic CHF, CVA, chronic severe lymphedema, COPD and hypertension who presents with worsening swelling of the lower extremity.   Initially consulted by ED  because patient has been having increasing lower extremity swelling for the past few days as well as orthopnea, increasing shortness of breath and weakness.  However patient denies any shortness of breath or any weakness.  Reports she was still able to carry her own groceries into the house today.  She says her lower extremity has started weeping recently.  She has tried to take her medications daily but there could be a chance that she misses her Lasix sometimes.    She was hospitalized back in March for acute on chronic diastolic CHF with predominantly right heart failure.  She was discharged home on 60 mg Lasix daily and referred to lymphedema clinic.  States she had an appointment that was canceled by the lymphedema clinic and has not yet gone.  Also reports that she has never taken up to 60 mg of Lasix and is currently on 30 mg.   In the ED, she was afebrile normotensive on room air.  Has leukocytosis of 3.7K, hemoglobin at baseline at 10.4.  Thrombocytopenia at baseline around 124.  BMP unremarkable. BNP of 367 from a prior of 303.   UA is negative.  Assessment & Plan:   Principal Problem:   Lymphedema Active Problems:   Essential hypertension   History of CVA (cerebrovascular accident)   Chronic diastolic CHF (congestive heart failure) (HCC)   Acute on chronic diastolic (congestive) heart failure (HCC)  Acute on chronic diastolic congestive heart failure/bilateral lymphedema: Worsening lower extremity lymphedema.  Patient states that she does not weigh herself as she does not have a scale and she has moved recently to  different place.  She has been taking only 30 mg of p.o. Lasix whereas she was instructed to take 60 mg at the time of discharge during recent hospitalization in March.  No shortness of breath, chest x-ray negative for vascular congestion.  Lungs clear to auscultation.  She has had great diuresis since admission, net -6.7 L, continue Lasix 40 mg IV twice daily.  Wound care on board for bilateral lower extremity wounds.  History of CVA: Continue aspirin  Essential hypertension: Controlled.  Continue metoprolol.  Generalized weakness/disposition: PT OT recommended SNF.  Insurance authorization pending.       DVT prophylaxis: enoxaparin (LOVENOX) injection 40 mg Start: 06/28/21 2015   Code Status: Full Code  Family Communication:  None present at bedside.  Plan of care discussed with patient in length and he/she verbalized understanding and agreed with it.    Status is: Inpatient Remains inpatient appropriate because: Waiting for insurance authorization for SNF    Estimated body mass index is 25.62 kg/m as calculated from the following:   Height as of this encounter: '5\' 1"'$  (1.549 m).   Weight as of this encounter: 61.5 kg.    Nutritional Assessment: Body mass index is 25.62 kg/m.Marland Kitchen Seen by dietician.  I agree with the assessment and plan as outlined below: Nutrition Status:        . Skin Assessment: I have examined the patient's skin and I agree with the wound assessment as performed by the wound care RN as outlined below:  Consultants:  None  Procedures:  None  Antimicrobials:  Anti-infectives (From admission, onward)    None         Subjective:  Seen and examined.  She has no complaints.  She is looking forward to [facility] to the [facility].  Objective: Vitals:   07/01/21 0802 07/01/21 1232 07/01/21 1900 07/02/21 0352  BP: 131/76 (!) 115/48 116/68 (!) 126/59  Pulse: 76 83 68 64  Resp: '18 17 16 20  '$ Temp: 98.5 F (36.9 C) 98.7 F (37.1 C) (!) 97.5  F (36.4 C) 98 F (36.7 C)  TempSrc: Oral Oral Oral Oral  SpO2:   95% 92%  Weight:    61.5 kg  Height:        Intake/Output Summary (Last 24 hours) at 07/02/2021 1130 Last data filed at 07/02/2021 0353 Gross per 24 hour  Intake 200 ml  Output 1300 ml  Net -1100 ml    Filed Weights   06/30/21 0233 07/01/21 0412 07/02/21 0352  Weight: 71.2 kg 67 kg 61.5 kg    Examination:  General exam: Appears calm and comfortable  Respiratory system: Clear to auscultation. Respiratory effort normal. Cardiovascular system: S1 & S2 heard, RRR. No JVD, murmurs, rubs, gallops or clicks. No pedal edema. Gastrointestinal system: Abdomen is nondistended, soft and nontender. No organomegaly or masses felt. Normal bowel sounds heard. Central nervous system: Alert and oriented. No focal neurological deficits. Extremities: Dressing in bilateral lower extremities, edema only +1. Psychiatry: Judgement and insight appear normal. Mood & affect appropriate.   Data Reviewed: I have personally reviewed following labs and imaging studies  CBC: Recent Labs  Lab 06/28/21 1710 06/29/21 0428 07/01/21 0918  WBC 3.7* 4.0 4.4  NEUTROABS 2.4  --   --   HGB 10.4* 10.4* 12.0  HCT 33.2* 33.5* 37.4  MCV 89.0 90.1 88.2  PLT 124* 121* 170    Basic Metabolic Panel: Recent Labs  Lab 06/28/21 1710 06/30/21 0337 07/01/21 0918  NA 141 140 135  K 3.9 3.3* 3.7  CL 105 102 98  CO2 '27 31 28  '$ GLUCOSE 82 79 112*  BUN 9 7* 8  CREATININE 0.86 0.91 0.94  CALCIUM 8.6* 8.1* 8.2*    GFR: Estimated Creatinine Clearance: 42.2 mL/min (by C-G formula based on SCr of 0.94 mg/dL). Liver Function Tests: No results for input(s): "AST", "ALT", "ALKPHOS", "BILITOT", "PROT", "ALBUMIN" in the last 168 hours. No results for input(s): "LIPASE", "AMYLASE" in the last 168 hours. No results for input(s): "AMMONIA" in the last 168 hours. Coagulation Profile: No results for input(s): "INR", "PROTIME" in the last 168 hours. Cardiac  Enzymes: No results for input(s): "CKTOTAL", "CKMB", "CKMBINDEX", "TROPONINI" in the last 168 hours. BNP (last 3 results) No results for input(s): "PROBNP" in the last 8760 hours. HbA1C: No results for input(s): "HGBA1C" in the last 72 hours. CBG: No results for input(s): "GLUCAP" in the last 168 hours. Lipid Profile: No results for input(s): "CHOL", "HDL", "LDLCALC", "TRIG", "CHOLHDL", "LDLDIRECT" in the last 72 hours. Thyroid Function Tests: No results for input(s): "TSH", "T4TOTAL", "FREET4", "T3FREE", "THYROIDAB" in the last 72 hours. Anemia Panel: No results for input(s): "VITAMINB12", "FOLATE", "FERRITIN", "TIBC", "IRON", "RETICCTPCT" in the last 72 hours. Sepsis Labs: No results for input(s): "PROCALCITON", "LATICACIDVEN" in the last 168 hours.  No results found for this or any previous visit (from the past 240 hour(s)).   Radiology Studies: No results found.  Scheduled Meds:  aspirin EC  81 mg Oral Q lunch   enoxaparin (  LOVENOX) injection  40 mg Subcutaneous Q24H   furosemide  40 mg Intravenous BID   metoprolol succinate  25 mg Oral Daily   potassium chloride SA  40 mEq Oral Daily   Continuous Infusions:   LOS: 2 days   Darliss Cheney, MD [facility]  07/02/2021, 11:30 AM   *Please note that this is a verbal dictation therefore any spelling or grammatical errors are due to the "[place]" system interpretation.  Please page via [place] and do not message via secure chat for urgent patient care matters. Secure chat can be used for non urgent patient care matters.  How to contact the [facility] Attending or Consulting provider [place] or covering provider during after hours [place], for this patient?  Check the care team in [facility] and look for a) attending/consulting TRH provider listed and b) the [facility] team listed. Page or secure chat [phone]. Log into [email or web address] and use [place]'s universal password to access. If you do not have the password, please contact the  hospital operator. Locate the [facility] provider you are looking for under [facility] and page to a number that you can be directly reached. If you still have difficulty reaching the provider, please page the [facility] (Director on Call) for the Hospitalists listed on amion for assistance.

## 2021-07-02 NOTE — Progress Notes (Signed)
Dressing change to lower extremities left and right were completed.

## 2021-07-03 LAB — BASIC METABOLIC PANEL
Anion gap: 9 (ref 5–15)
BUN: 13 mg/dL (ref 8–23)
CO2: 32 mmol/L (ref 22–32)
Calcium: 8.4 mg/dL — ABNORMAL LOW (ref 8.9–10.3)
Chloride: 96 mmol/L — ABNORMAL LOW (ref 98–111)
Creatinine, Ser: 0.94 mg/dL (ref 0.44–1.00)
GFR, Estimated: >60 mL/min (ref >60)
Glucose, Bld: 88 mg/dL (ref 70–99)
Potassium: 3.7 mmol/L (ref 3.5–5.1)
Sodium: 137 mmol/L (ref 135–145)

## 2021-07-03 NOTE — Progress Notes (Signed)
Patient assisted to sink for a bath. Patient became faint and sat down in the chair behind her with RN present. Assisted patient back to bed with use of Steady. CCMD later called and reported sinus bradycardia, HR 38 that was nonsustained. Vitals completed on patient; noted to be hypotensive. MD paged via Shea Evans. New orders noted to d/c IV lasix dose for this evening.

## 2021-07-03 NOTE — Progress Notes (Signed)
PROGRESS NOTE    Jeanne Lawson  IPJ:[id number] DOB: [birth year] DOA: 06/28/2021 PCP: [name], MD   Brief Narrative:  HPI: Jeanne Lawson is a 76 y.o. female with medical history significant of chronic diastolic CHF, CVA, chronic severe lymphedema, COPD and hypertension who presents with worsening swelling of the lower extremity.   Initially consulted by ED  because patient has been having increasing lower extremity swelling for the past few days as well as orthopnea, increasing shortness of breath and weakness.  However patient denies any shortness of breath or any weakness.  Reports she was still able to carry her own groceries into the house today.  She says her lower extremity has started weeping recently.  She has tried to take her medications daily but there could be a chance that she misses her Lasix sometimes.    She was hospitalized back in March for acute on chronic diastolic CHF with predominantly right heart failure.  She was discharged home on 60 mg Lasix daily and referred to lymphedema clinic.  States she had an appointment that was canceled by the lymphedema clinic and has not yet gone.  Also reports that she has never taken up to 60 mg of Lasix and is currently on 30 mg.   In the ED, she was afebrile normotensive on room air.  Has leukocytosis of 3.7K, hemoglobin at baseline at 10.4.  Thrombocytopenia at baseline around 124.  BMP unremarkable. BNP of 367 from a prior of 303.   UA is negative.  Assessment & Plan:   Principal Problem:   Lymphedema Active Problems:   Essential hypertension   History of CVA (cerebrovascular accident)   Chronic diastolic CHF (congestive heart failure) (HCC)   Acute on chronic diastolic (congestive) heart failure (HCC)  Acute on chronic diastolic congestive heart failure/bilateral lymphedema: Worsening lower extremity lymphedema.  Patient states that she does not weigh herself as she does not have a scale and she has moved recently to  different place.  She has been taking only 30 mg of p.o. Lasix whereas she was instructed to take 60 mg at the time of discharge during recent hospitalization in March.  No shortness of breath, chest x-ray negative for vascular congestion.  Lungs clear to auscultation.  She has had great diuresis since admission, net -8.5 L, continue Lasix 40 mg IV twice daily.  Wound care on board for bilateral lower extremity wounds.  History of CVA: Continue aspirin  Essential hypertension: Controlled.  Continue metoprolol.  Generalized weakness/disposition: PT OT recommended SNF.  Insurance authorization pending.       DVT prophylaxis: enoxaparin (LOVENOX) injection 40 mg Start: 06/28/21 2015   Code Status: Full Code  Family Communication:  None present at bedside.  Plan of care discussed with patient in length and he/she verbalized understanding and agreed with it.    Status is: Inpatient Remains inpatient appropriate because: Waiting for insurance authorization for SNF    Estimated body mass index is 25.28 kg/m as calculated from the following:   Height as of this encounter: '5\' 1"'$  (1.549 m).   Weight as of this encounter: 60.7 kg.    Nutritional Assessment: Body mass index is 25.28 kg/m.Marland Kitchen Seen by dietician.  I agree with the assessment and plan as outlined below: Nutrition Status:        . Skin Assessment: I have examined the patient's skin and I agree with the wound assessment as performed by the wound care RN as outlined below:  Consultants:  None  Procedures:  None  Antimicrobials:  Anti-infectives (From admission, onward)    None         Subjective:  Seen and examined.  She has no complaints.  She is eager to go to SNF.  Objective: Vitals:   07/01/21 1900 07/02/21 0352 07/02/21 2143 07/03/21 0554  BP: 116/68 (!) 126/59 (!) 119/52 123/63  Pulse: 68 64 61 65  Resp: '16 20 20 20  '$ Temp: (!) 97.5 F (36.4 C) 98 F (36.7 C) 98.4 F (36.9 C) 98.3 F (36.8 C)   TempSrc: Oral Oral Oral Oral  SpO2: 95% 92% 93% 91%  Weight:  61.5 kg  60.7 kg  Height:        Intake/Output Summary (Last 24 hours) at 07/03/2021 1151 Last data filed at 07/03/2021 3500 Gross per 24 hour  Intake 180 ml  Output 1050 ml  Net -870 ml    Filed Weights   07/01/21 0412 07/02/21 0352 07/03/21 0554  Weight: 67 kg 61.5 kg 60.7 kg    Examination:  General exam: Appears calm and comfortable  Respiratory system: Clear to auscultation. Respiratory effort normal. Cardiovascular system: S1 & S2 heard, RRR. No JVD, murmurs, rubs, gallops or clicks.  Gastrointestinal system: Abdomen is nondistended, soft and nontender. No organomegaly or masses felt. Normal bowel sounds heard. Central nervous system: Alert and oriented. No focal neurological deficits. Extremities: Dressing in bilateral lower extremities. Psychiatry: Judgement and insight appear normal. Mood & affect appropriate.    Data Reviewed: I have personally reviewed following labs and imaging studies  CBC: Recent Labs  Lab 06/28/21 1710 06/29/21 0428 07/01/21 0918  WBC 3.7* 4.0 4.4  NEUTROABS 2.4  --   --   HGB 10.4* 10.4* 12.0  HCT 33.2* 33.5* 37.4  MCV 89.0 90.1 88.2  PLT 124* 121* 938    Basic Metabolic Panel: Recent Labs  Lab 06/28/21 1710 06/30/21 0337 07/01/21 0918 07/03/21 0335  NA 141 140 135 137  K 3.9 3.3* 3.7 3.7  CL 105 102 98 96*  CO2 '27 31 28 '$ 32  GLUCOSE 82 79 112* 88  BUN 9 7* 8 13  CREATININE 0.86 0.91 0.94 0.94  CALCIUM 8.6* 8.1* 8.2* 8.4*    GFR: Estimated Creatinine Clearance: 41.9 mL/min (by C-G formula based on SCr of 0.94 mg/dL). Liver Function Tests: No results for input(s): "AST", "ALT", "ALKPHOS", "BILITOT", "PROT", "ALBUMIN" in the last 168 hours. No results for input(s): "LIPASE", "AMYLASE" in the last 168 hours. No results for input(s): "AMMONIA" in the last 168 hours. Coagulation Profile: No results for input(s): "INR", "PROTIME" in the last 168 hours. Cardiac  Enzymes: No results for input(s): "CKTOTAL", "CKMB", "CKMBINDEX", "TROPONINI" in the last 168 hours. BNP (last 3 results) No results for input(s): "PROBNP" in the last 8760 hours. HbA1C: No results for input(s): "HGBA1C" in the last 72 hours. CBG: No results for input(s): "GLUCAP" in the last 168 hours. Lipid Profile: No results for input(s): "CHOL", "HDL", "LDLCALC", "TRIG", "CHOLHDL", "LDLDIRECT" in the last 72 hours. Thyroid Function Tests: No results for input(s): "TSH", "T4TOTAL", "FREET4", "T3FREE", "THYROIDAB" in the last 72 hours. Anemia Panel: No results for input(s): "VITAMINB12", "FOLATE", "FERRITIN", "TIBC", "IRON", "RETICCTPCT" in the last 72 hours. Sepsis Labs: No results for input(s): "PROCALCITON", "LATICACIDVEN" in the last 168 hours.  No results found for this or any previous visit (from the past 240 hour(s)).   Radiology Studies: No results found.  Scheduled Meds:  aspirin EC  81 mg Oral Q  lunch   enoxaparin (LOVENOX) injection  40 mg Subcutaneous Q24H   furosemide  40 mg Intravenous BID   metoprolol succinate  25 mg Oral Daily   potassium chloride SA  40 mEq Oral Daily   Continuous Infusions:   LOS: 3 days   Darliss Cheney, MD [facility]  07/03/2021, 11:51 AM   *Please note that this is a verbal dictation therefore any spelling or grammatical errors are due to the "[place]" system interpretation.  Please page via [place] and do not message via secure chat for urgent patient care matters. Secure chat can be used for non urgent patient care matters.  How to contact the [facility] Attending or Consulting provider [place] or covering provider during after hours [place], for this patient?  Check the care team in [facility] and look for a) attending/consulting TRH provider listed and b) the [facility] team listed. Page or secure chat [phone]. Log into [email or web address] and use [place]'s universal password to access. If you do not have the password, please contact the  hospital operator. Locate the [facility] provider you are looking for under [facility] and page to a number that you can be directly reached. If you still have difficulty reaching the provider, please page the [facility] (Director on Call) for the Hospitalists listed on amion for assistance.

## 2021-07-03 NOTE — TOC Progression Note (Addendum)
Transition of Care [facility]) - Progression Note    Patient Details  Name: Jeanne Lawson MRN: [id number] Date of Birth: 02-Sep-1945  Transition of [facility]) CM/SW [place], LCSW Phone Number:[phone] 07/03/2021, 8:07 AM  Clinical Narrative:     CSW checked pt's authorization and it is still pending.  1:12 PM Pt's authorization is still pending.  TOC team will continue to assist with discharge planning needs.   Expected Discharge Plan: Skilled Nursing Facility Barriers to Discharge: Ship broker, SNF Pending bed offer  Expected Discharge Plan and Services Expected Discharge Plan: [place] arrangements for the past 2 months: [place]                                       Social Determinants of Health (SDOH) Interventions    Readmission Risk Interventions     No data to display

## 2021-07-04 MED ORDER — HYDROCODONE-ACETAMINOPHEN 5-325 MG PO TABS: 1.0000 | ORAL_TABLET | Freq: Four times a day (QID) | ORAL | 0 refills | Status: AC | PRN

## 2021-07-04 NOTE — Progress Notes (Signed)
Report called to [place] RN.  Era Bumpers, RN

## 2021-07-04 NOTE — Discharge Summary (Signed)
PatientPhysician Discharge Summary  Jeanne Lawson EXH:[id number] DOB: [birth year] DOA: 06/28/2021  PCP: Jeanne Hatchet, MD  Admit date: 06/28/2021 Discharge date: 07/04/2021 30 Day Unplanned Readmission Risk Score    Flowsheet Row ED to Hosp-Admission (Current) from 06/28/2021 in [facility]  30 Day Unplanned Readmission Risk Score (%) 14.13 Filed at 07/04/2021 0801       This score is the patient's risk of an unplanned readmission within 30 days of being discharged (0 -100%). The score is based on dignosis, age, lab data, medications, orders, and past utilization.   Low:  0-14.9   Medium: 15-21.9   High: 22-29.9   Extreme: 30 and above          Admitted From: Home Disposition: SNF  Recommendations for Outpatient Follow-up:  Follow up with PCP in 1-2 weeks Please obtain BMP/CBC in one week Follow-up with lymphedema clinic as advised during previous discharge and during this discharge Please follow up with your PCP on the following pending results: Unresulted Labs (From admission, onward)    None         Home Health: None Equipment/Devices: None  Discharge Condition: Stable CODE STATUS: Full code Diet recommendation: Cardiac  Subjective: Seen and examined.  She has no complaints.  She is eager to go to SNF.  Brief/Interim Summary: Jeanne Lawson is a 76 y.o. female with medical history significant of chronic diastolic CHF, CVA, chronic severe lymphedema, COPD and hypertension who presented with worsening swelling of the lower extremity. She said she tried to take her medications daily but her daughter informed us that patient is noncompliant with her Lasix.   She was hospitalized back in March for acute on chronic diastolic CHF with predominantly right heart failure.  She was discharged home on 60 mg Lasix daily and referred to lymphedema clinic.  States she had an appointment that was canceled by the lymphedema clinic and has not yet gone.  Also reports  that she has never taken up to 60 mg of Lasix and is currently on 30 mg.   Upon arrival to ED, she was hemodynamically stable.  She was admitted to hospital service, detailed hospitalization as below.  Acute on chronic diastolic congestive heart failure/bilateral lymphedema: Worsening lower extremity lymphedema.  Patient was started on Lasix 40 mg IV twice daily.  Patient has diuresed significantly and she is net -9 L since admission.  Patient's edema has significantly improved as well.  She has been strongly advised to resume taking Lasix 60 mg p.o. daily and remain compliant.   History of CVA: Continue aspirin   Essential hypertension: Controlled.  Continue metoprolol.   Generalized weakness/disposition: PT OT recommended SNF.  Bed placement and insurance authorization has been approved and she is going to be discharged today in stable condition.  Discharge plan was discussed with patient and/or family member and they verbalized understanding and agreed with it.  Discharge Diagnoses:  Principal Problem:   Lymphedema Active Problems:   Essential hypertension   History of CVA (cerebrovascular accident)   Chronic diastolic CHF (congestive heart failure) (HCC)   Acute on chronic diastolic (congestive) heart failure ([place])    Discharge Instructions   Allergies as of 07/04/2021       Reactions   Dilaudid [hydromorphone] Swelling   SWELLING REACTION UNSPECIFIED    Latex Swelling   gloves used at dental office caused lips to swell. Used different ones no problem. Never had any problem at hospital or anywhere else.  Lidocaine Other (See Comments)   "makes my heart race"   Sulfa Drugs Cross Reactors Other (See Comments)   UNSPECIFIED REACTION OF CHILDHOOD   Codeine Nausea And Vomiting        Medication List     TAKE these medications    albuterol 108 (90 Base) MCG/ACT inhaler Commonly known as: VENTOLIN HFA Inhale 1-2 puffs into the lungs every 6 (six) hours as needed for  wheezing or shortness of breath.   aspirin EC 81 MG tablet Take 81 mg by mouth daily with lunch.   furosemide 40 MG tablet Commonly known as: LASIX Take 1.5 tablets (60 mg total) by mouth daily.   HYDROcodone-acetaminophen 5-325 MG tablet Commonly known as: NORCO/VICODIN Take 1 tablet by mouth every 6 (six) hours as needed for moderate pain. What changed: how much to take   potassium chloride SA 20 MEQ tablet Commonly known as: KLOR-CON M Take 2 tablets (40 mEq total) by mouth daily.   Toprol XL 50 MG 24 hr tablet Generic drug: metoprolol succinate TAKE 1/2 TABLET BY MOUTH TWICE DAILY. TAKE WITH OR IMMEDIATELY FOLLOWING A MEAL Please keep upcoming appt with Dr. Johney Frame new Cardiologist in April 2023 before anymore refills. Thank you Final Attempt What changed:  how much to take how to take this when to take this        Contact information for follow-up providers     Jeanne Hatchet, MD Follow up.   Specialty: Internal Medicine Why: The office will call patient. Contact information: [address] [place] [address] [phone]         Freada Bergeron, MD .   Specialties: Cardiology, Radiology Contact information: [phone] [address] [phone]         Jeanne Hatchet, MD Follow up in 1 week(s).   Specialty: Internal Medicine Contact information: [place] [address] [phone]         Freada Bergeron, MD .   Specialties: Cardiology, Radiology Contact information: [address] [place] [address] [phone]              Contact information for after-discharge care     Destination     [facility] Preferred SNF .   Service: Skilled Nursing Contact information: [place] [address] [phone]                    Allergies  Allergen Reactions   Dilaudid [Hydromorphone]  Swelling    SWELLING REACTION UNSPECIFIED    Latex Swelling    gloves used at dental office caused lips to swell. Used different ones no problem. Never had any problem at hospital or anywhere else.   Lidocaine Other (See Comments)    "makes my heart race"   Sulfa Drugs Cross Reactors Other (See Comments)    UNSPECIFIED REACTION OF CHILDHOOD   Codeine Nausea And Vomiting    Consultations: None   Procedures/Studies: DG Chest 2 View  Result Date: 06/28/2021 CLINICAL DATA:  Bilateral lower extremity swelling x4 days. EXAM: CHEST - 2 VIEW COMPARISON:  April 16, 2021 FINDINGS: The cardiac silhouette is mildly enlarged and unchanged in size. There is mild to moderate severity calcification of the thoracic aorta. Mild, stable, diffusely increased lung markings are seen. There is no evidence of focal consolidation, pleural effusion or pneumothorax. The visualized skeletal structures are unremarkable. IMPRESSION: Stable cardiomegaly without acute or active cardiopulmonary disease. Electronically  Signed   By: Virgina Norfolk M.D.   On: 06/28/2021 19:37     Discharge Exam: Vitals:   07/04/21 0454 07/04/21 0823  BP: 139/75 111/81  Pulse: 76 89  Resp: 20 18  Temp: 98.4 F (36.9 C) 97.6 F (36.4 C)  SpO2: 94% 95%   Vitals:   07/03/21 1923 07/03/21 2142 07/04/21 0454 07/04/21 0823  BP: (!) 107/55 (!) 121/54 139/75 111/81  Pulse:  66 76 89  Resp:  '20 20 18  '$ Temp:  97.9 F (36.6 C) 98.4 F (36.9 C) 97.6 F (36.4 C)  TempSrc:  Oral Oral Oral  SpO2:  94% 94% 95%  Weight:   62.5 kg   Height:        General: Pt is alert, awake, not in acute distress Cardiovascular: RRR, S1/S2 +, no rubs, no gallops Respiratory: CTA bilaterally, no wheezing, no rhonchi Abdominal: Soft, NT, ND, bowel sounds + Extremities: Bilateral lower extremity lymphedema, much improved since admission.    The results of significant diagnostics from this hospitalization (including imaging, microbiology, ancillary  and laboratory) are listed below for reference.     Microbiology: No results found for this or any previous visit (from the past 240 hour(s)).   Labs: BNP (last 3 results) Recent Labs    04/16/21 0258 06/28/21 1710  BNP 303.8* 588.5*   Basic Metabolic Panel: Recent Labs  Lab 06/28/21 1710 06/30/21 0337 07/01/21 0918 07/03/21 0335  NA 141 140 135 137  K 3.9 3.3* 3.7 3.7  CL 105 102 98 96*  CO2 '27 31 28 '$ 32  GLUCOSE 82 79 112* 88  BUN 9 7* 8 13  CREATININE 0.86 0.91 0.94 0.94  CALCIUM 8.6* 8.1* 8.2* 8.4*   Liver Function Tests: No results for input(s): "AST", "ALT", "ALKPHOS", "BILITOT", "PROT", "ALBUMIN" in the last 168 hours. No results for input(s): "LIPASE", "AMYLASE" in the last 168 hours. No results for input(s): "AMMONIA" in the last 168 hours. CBC: Recent Labs  Lab 06/28/21 1710 06/29/21 0428 07/01/21 0918  WBC 3.7* 4.0 4.4  NEUTROABS 2.4  --   --   HGB 10.4* 10.4* 12.0  HCT 33.2* 33.5* 37.4  MCV 89.0 90.1 88.2  PLT 124* 121* 150   Cardiac Enzymes: No results for input(s): "CKTOTAL", "CKMB", "CKMBINDEX", "TROPONINI" in the last 168 hours. BNP: Invalid input(s): "POCBNP" CBG: No results for input(s): "GLUCAP" in the last 168 hours. D-Dimer No results for input(s): "DDIMER" in the last 72 hours. Hgb A1c No results for input(s): "HGBA1C" in the last 72 hours. Lipid Profile No results for input(s): "CHOL", "HDL", "LDLCALC", "TRIG", "CHOLHDL", "LDLDIRECT" in the last 72 hours. Thyroid function studies No results for input(s): "TSH", "T4TOTAL", "T3FREE", "THYROIDAB" in the last 72 hours.  Invalid input(s): "FREET3" Anemia work up No results for input(s): "VITAMINB12", "FOLATE", "FERRITIN", "TIBC", "IRON", "RETICCTPCT" in the last 72 hours. Urinalysis    Component Value Date/Time   COLORURINE STRAW (A) 06/28/2021 1710   APPEARANCEUR CLEAR 06/28/2021 1710   LABSPEC 1.005 06/28/2021 1710   PHURINE 6.0 06/28/2021 1710   GLUCOSEU NEGATIVE 06/28/2021  1710   HGBUR NEGATIVE 06/28/2021 1710   BILIRUBINUR NEGATIVE 06/28/2021 1710   KETONESUR NEGATIVE 06/28/2021 1710   PROTEINUR NEGATIVE 06/28/2021 1710   UROBILINOGEN 1.0 11/13/2014 1006   NITRITE NEGATIVE 06/28/2021 1710   LEUKOCYTESUR NEGATIVE 06/28/2021 1710   Sepsis Labs Recent Labs  Lab 06/28/21 1710 06/29/21 0428 07/01/21 0918  WBC 3.7* 4.0 4.4   Microbiology No results found for this or  any previous visit (from the past 240 hour(s)).   Time coordinating discharge: Over 30 minutes  SIGNED:   Darliss Cheney, MD  [facility] 07/04/2021, 11:48 AM *Please note that this is a verbal dictation therefore any spelling or grammatical errors are due to the "[employer]" system interpretation. If 7PM-7AM, please contact night-coverage [email or web address]

## 2021-07-04 NOTE — Care Management Important Message (Signed)
Important Message  Patient Details  Name: Jeanne Lawson MRN: [id number] Date of Birth: 11-27-1945   Medicare Important Message Given:  Yes     Shelda Altes 07/04/2021, 7:52 AM

## 2021-07-04 NOTE — Plan of Care (Signed)
  Problem: Safety: Goal: Ability to remain free from injury will improve Outcome: Completed/Met

## 2021-07-04 NOTE — TOC Transition Note (Signed)
Transition of Care [facility]) - CM/SW Discharge Note   Patient Details  Name: Jeanne Lawson MRN: [id number] Date of Birth: 18-Oct-1945  [facility] CM/SW Contact:  Vinie Sill, LCSW Phone Number: 07/04/2021, 1:09 PM   Clinical Narrative:     Patient will Discharge to: [facility]  [facility] Discharge Date: 07/04/2021 Family Notified: daughter  Transport By: Corey Harold  Per MD patient is ready for discharge. RN, patient, and facility notified of discharge. Discharge Summary sent to facility. RN given number for report [phone], Room 202. Ambulance transport requested for patient.   Clinical Social Worker signing off.  Thurmond Butts, MSW, LCSW Clinical Social Worker    Final next level of care: Skilled Nursing Facility Barriers to Discharge: Barriers Resolved   Patient Goals and CMS Choice        Discharge Placement              Patient chooses bed at: [facility] Patient to be transferred to facility by: [facility] Name of family member notified: daughter Patient and family notified of of transfer: 07/04/21  Discharge Plan and Services                                     Social Determinants of Health (SDOH) Interventions     Readmission Risk Interventions     No data to display

## 2021-11-08 ENCOUNTER — Ambulatory Visit: Payer: Medicare Other | Attending: Internal Medicine | Admitting: Physical Therapy

## 2021-11-08 DIAGNOSIS — R29898 Other symptoms and signs involving the musculoskeletal system: Secondary | ICD-10-CM | POA: Diagnosis present

## 2021-11-08 DIAGNOSIS — R2681 Unsteadiness on feet: Secondary | ICD-10-CM | POA: Insufficient documentation

## 2021-11-08 DIAGNOSIS — R2689 Other abnormalities of gait and mobility: Secondary | ICD-10-CM | POA: Insufficient documentation

## 2021-11-08 DIAGNOSIS — M6281 Muscle weakness (generalized): Secondary | ICD-10-CM | POA: Diagnosis present

## 2021-11-08 NOTE — Therapy (Signed)
OUTPATIENT PHYSICAL THERAPY NEURO EVALUATION   Patient Name: Jeanne Lawson MRN: [id number] DOB:[birth year], 76 y.o., female Today's Date: 11/09/2021   PCP: [name], MD  REFERRING PROVIDER: [name], MD    PT End of Session - 11/09/21 1917     Visit Number 1    Number of Visits 9    Authorization Type [employer] Medicare    Authorization Time Period 11-08-21 - 01-22-22    PT Start Time 0934    PT Stop Time 1017    PT Time Calculation (min) 43 min    Equipment Utilized During Treatment Gait belt    Activity Tolerance Patient tolerated treatment well    Behavior During Therapy [facility] for tasks assessed/performed             Past Medical History:  Diagnosis Date   Acute on chronic diastolic CHF (congestive heart failure) ([place]) 04/16/2021   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    COPD (chronic obstructive pulmonary disease) ([place])    Coronary artery disease 1996   Known with prior mild lesion of LAD demonstrated by Cardiac Catheterization in 1996   Edema of foot    She has a history of chronic edema of the left dated back to age 77 when she suufered severe frostbite playing  on the snow as a child   Hypertension    Lower extremity edema    PAC (premature atrial contraction)    Pancreatitis    x2   PONV (postoperative nausea and vomiting)    problems waking up last time 4/16 only time   Saccular aneurysm    She also has 2 known which were stable between the MRA of October2010 and the MRA  of April 2011.   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 Has normal EF.   Past Surgical History:  Procedure Laterality Date   ABDOMINAL HYSTERECTOMY      APPENDECTOMY     CARDIAC CATHETERIZATION  1996   Mild CAD with vasospasm   CARDIOVASCULAR STRESS TEST  12-02-2001   EF 70%   CHOLECYSTECTOMY N/A 03/09/2016   Procedure: LAPAROSCOPIC CHOLECYSTECTOMY WITH INTRAOPERATIVE CHOLANGIOGRAM;  Surgeon: Georganna Skeans, MD;  Location: [place];  Service: General;  Laterality: N/A;   ENDARTERECTOMY Right 08/12/2014   Procedure: RIGHT  COMMON CAROTID ARTERY EXPOSURE FOR INTERVENTIONAL RADIOLOGY PROCEDURE BY DR.DEVASHWAR,Insertion 6 FR sheath;  Surgeon: Elam Dutch, MD;  Location: [place];  Service: Vascular;  Laterality: Right;   ENDARTERECTOMY Right 08/12/2014   Procedure:  CAROTID  EXPOSURE CLOSURE RIGHT NECK, REPAIR RIGHT COMMON CAROTID ARTERY;  Surgeon: Elam Dutch, MD;  Location: [place];  Service: Vascular;  Laterality: Right;   ENDARTERECTOMY Left 11/18/2014   Procedure: CAROTID EXPOSURE;  Surgeon: Elam Dutch, MD;  Location: [place];  Service: Vascular;  Laterality: Left;   ENDARTERECTOMY Left 11/18/2014   Procedure: CLOSURE CAROTID;  Surgeon: Elam Dutch, MD;  Location: [place];  Service: Vascular;  Laterality: Left;   ENDARTERECTOMY Right 08/22/2017   Procedure: EXPOSURE CAROTID ARTERY FOR [place];  Surgeon: Elam Dutch, MD;  Location: [place];  Service:  Vascular;  Laterality: Right;   IR ANGIO INTRA EXTRACRAN SEL INTERNAL CAROTID UNI R MOD SED  08/22/2017   IR ANGIOGRAM FOLLOW UP STUDY  08/22/2017   IR RADIOLOGIST EVAL & MGMT  06/26/2016   IR RADIOLOGIST EVAL & MGMT  05/08/2017   IR RADIOLOGY PERIPHERAL GUIDED IV START  12/03/2017   IR TRANSCATH/EMBOLIZ  08/22/2017   IR US GUIDE VASC ACCESS RIGHT  12/03/2017   LAPAROSCOPY     NECK SURGERY  50 yrs ago   Left side tumor   RADIOLOGY WITH ANESTHESIA N/A 05/25/2014   Procedure: RADIOLOGY WITH ANESTHESIA;  Surgeon: [name], MD;  Location: [place];  Service: Radiology;  Laterality: N/A;   RADIOLOGY WITH ANESTHESIA N/A 08/12/2014   Procedure: RADIOLOGY WITH ANESTHESIA;  Surgeon:  [name], MD;  Location: [place];  Service: Radiology;  Laterality: N/A;   RADIOLOGY WITH ANESTHESIA N/A 03/15/2015   Procedure: MRI OF BRAIN WITHOUT CONTRAST;  Surgeon: Medication Radiologist, MD;  Location: [place];  Service: Radiology;  Laterality: N/A;  DR. TAT/MRI   US ECHOCARDIOGRAPHY  04-26-2009   EF 65-70%   VESICOVAGINAL FISTULA CLOSURE W/ TAH  25 yrs ago   WOUND EXPLORATION Right 08/22/2017   Procedure: REMOVAL OF RIGHT COMMON CAROTID SHEATH AND WOUND CLOSURE;  Surgeon: Rosetta Posner, MD;  Location: [facility];  Service: Vascular;  Laterality: Right;   Patient Active Problem List   Diagnosis Date Noted   Acute on chronic diastolic (congestive) heart failure ([place]) 06/30/2021   History of CVA (cerebrovascular accident) 06/28/2021   Chronic diastolic CHF (congestive heart failure) ([place]) 06/28/2021   Lymphedema    Acute on chronic diastolic CHF (congestive heart failure) ([place]) 04/16/2021   COPD (chronic obstructive pulmonary disease) ([place])    Acute encephalopathy 04/15/2021   Edema of both lower extremities due to peripheral venous insufficiency 07/13/2020   Acute cholecystitis 03/07/2016   Small vessel disease, cerebrovascular 05/31/2015   Aneurysm, cerebral, nonruptured 05/31/2015   Dizziness and giddiness 05/31/2015   Altered mental status    Acute CVA (cerebrovascular accident) ([place]) 03/15/2015   Dysphasia 03/12/2015   Thrombocytopenia ([place]) 03/12/2015   Hypokalemia    Endotracheally intubated    Essential hypertension    Intracranial aneurysm 11/18/2014   Respiratory failure ([facility])    Cerebral aneurysm    Brain aneurysm    Aphasia    Palmar erythema    CVA (cerebral infarction) 05/02/2014   Aneurysm ([place]) 05/02/2014   Expressive aphasia 05/02/2014   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   Tobacco abuse 10/24/2010   TIA (transient ischemic attack)    Hypertension    Ventricular hypertrophy    CVA 02/23/2010   STRESS FRACTURE, FOOT  02/22/2010    ONSET DATE: March 2023:  Referral date 10-28-21  REFERRING DIAG: R26.0 (ICD-10-CM) - Ataxic gait I67.1 (ICD-10-CM) - Cerebral aneurysm, nonruptured   THERAPY DIAG:  Other abnormalities of gait and mobility  Unsteadiness on feet  Muscle weakness (generalized)  Other symptoms and signs involving the musculoskeletal system  Rationale for Evaluation and Treatment Rehabilitation  SUBJECTIVE:  SUBJECTIVE STATEMENT: Pt received OP PT at this clinic from Oct. 2022 to Feb. 2023 for impaired mobility due to severe lymphedema in bil. LE's.  She was hospitalized back in March for acute on chronic diastolic CHF with predominantly right heart failure.  She was discharged home on 60 mg Lasix daily and referred to lymphedema clinic.  States she had an appointment that was canceled by the lymphedema clinic and has not yet gone.  Also reports that she has never taken up to 60 mg of Lasix and is currently on 30 mg. Pt was re-admitted to hospital in June due to severity of edema; was discharged to [facility] for rehab and then discharged back to her assisted Living facility with round the clock care for first week. Pt accompanied by: self  PERTINENT HISTORY:  TIMI REESER is a 77 y.o. female with medical history significant of chronic diastolic CHF, CVA, chronic severe lymphedema, COPD and hypertension who presented with worsening swelling of the lower extremity.   PAIN:  Are you having pain? Yes: NPRS scale: 4/10 Pain location: low back and Rt knee Pain description: soreness Aggravating factors: weight bearing Relieving factors: non weight bearing positions Pt reports constant pain in low back and Rt knee;   PRECAUTIONS: None  WEIGHT BEARING RESTRICTIONS No  FALLS: Has patient fallen  in last 6 months? No  LIVING ENVIRONMENT: Lives with: lives alone and lives in an assisted living facility Lives in: Other assisted living facility Has following equipment at home: Single point cane, Environmental consultant - 2 wheeled, Electronics engineer, and Grab bars  PLOF: Independent with basic ADLs, Independent with household mobility with device, and Independent with transfers  PATIENT GOALS "to walk with my cane"  OBJECTIVE:    COGNITION: Overall cognitive status: Within functional limits for tasks assessed   SENSATION: WFL  COORDINATION: WFL's - slower due to lymphedema in bil. LE's  EDEMA:  Lymphedema in bil. LE's; pt is able to wear regular shoes to today's eval - Karle Starch style  POSTURE: No Significant postural limitations  LOWER EXTREMITY ROM:     WFL's bil. LE's  LOWER EXTREMITY MMT:    MMT Right Eval Left Eval  Hip flexion 4 4-  Hip extension    Hip abduction    Hip adduction    Hip internal rotation    Hip external rotation    Knee flexion 4 4  Knee extension 5 5  Ankle dorsiflexion 4 4-  Ankle plantarflexion    Ankle inversion    Ankle eversion    (Blank rows = not tested)  BED MOBILITY:  Independent   TRANSFERS: Assistive device utilized: Environmental consultant - 2 wheeled  Sit to stand: Modified independence Stand to sit: Modified independence  STAIRS:  Level of Assistance: CGA  Stair Negotiation Technique: Step to Pattern with Single Rail on Right  Number of Stairs: 4   Height of Stairs: 6   GAIT: Gait pattern: step through pattern, decreased arm swing- Left, decreased step length- Left, decreased hip/knee flexion- Left, decreased ankle dorsiflexion- Right, and decreased ankle dorsiflexion- Left Distance walked: 115 Assistive device utilized: Single point cane Level of assistance: CGA Comments: pt amb. With RW modified independently Gait velocity:  21.16 secs with RW = 1.55 ft/sec    FUNCTIONAL TESTs:  5 times sit to stand: 13.22 secs without UE support from  mat TUG score 20.37 secs with RW     PATIENT EDUCATION: Education details: eval results - comparison of scores in today's  eval with that in last PT session in Feb. 2023 Person educated: Patient Education method: Explanation Education comprehension: verbalized understanding   HOME EXERCISE PROGRAM: To be established    GOALS: Goals reviewed with patient? Yes  SHORT TERM GOALS: Target date: 12/09/2021   PT Short Term Goals - 11/09/21 1947       PT SHORT TERM GOAL #1   Title Increase gait velocity to >/= 1.9 ft/sec with RW for incr. gait efficiency.    Time 4    Period Weeks    Status New    Target Date 12/09/21      PT SHORT TERM GOAL #2   Title Pt will improve TUG score to </= 16 secs with RW to demo improved functional mobility.    Time 4    Period Weeks    Status New    Target Date 12/09/21      PT SHORT TERM GOAL #3   Title Pt will amb. 35' with SPC with SBA for increased safety with household and community mobility.    Time 4    Period Weeks    Status New    Target Date 12/09/21      PT SHORT TERM GOAL #4   Title Independent in HEP for balance, ROM and strengthening.    Time 4    Period Weeks    Status New    Target Date 12/09/21              LONG TERM GOALS: Target date: 01/06/2022   PT Long Term Goals - 11/09/21 1949       PT LONG TERM GOAL #1   Title Independent in updated HEP for bil. strengthening and balance exercises.    Time 8    Period Weeks    Status New    Target Date 01/06/22      PT LONG TERM GOAL #2   Title Improve 5x sit to stand score from 13.22 secs to </= 10 secs without UE support from mat.    Baseline 13.22 from mat    Time 8    Period Weeks    Status New    Target Date 01/06/22      PT LONG TERM GOAL #3   Title Improve TUG score to </= 17 secs with SPC to demo improved mobility.    Baseline 20.37 secs with RW    Time 8    Period Weeks    Status New      PT LONG TERM GOAL #4   Title Incr. gait velocity to  >/= 2.0  ft/sec with SPC for incr. gait efficiency.    Baseline 1.55 ft/sec with RW    Time 8    Period Weeks    Status New    Target Date 01/06/22           ASSESSMENT:  CLINICAL IMPRESSION: Patient is a 76 y.o. lady who was seen today for physical therapy evaluation and treatment for gait and balance impairment due to lymphedema in bil. LE's and also due to h/o Rt CVA in 2016.  Pt's TUG score 20.37 secs with RW is indicative of fall risk as score > 13.5 secs.  Pt able to amb. 115' with SPC with CGA.  Pt has decreased strength and decreased bil. Ankle ROM due lymphedema but scores at today's eval are better than that at last PT session in Feb. 2023, due to significantly decreased lymphedema in bil. LE's. Pt will benefit from PT  to address balance, gait and LE strength and ROM.   OBJECTIVE IMPAIRMENTS decreased balance, decreased endurance, difficulty walking, decreased ROM, decreased strength, increased edema, and pain.   ACTIVITY LIMITATIONS carrying, lifting, bending, standing, squatting, stairs, transfers, and locomotion level  PARTICIPATION LIMITATIONS: meal prep, cleaning, laundry, driving, and shopping  PERSONAL FACTORS Past/current experiences, Time since onset of injury/illness/exacerbation, and 1 comorbidity: lymphedema  are also affecting patient's functional outcome.   REHAB POTENTIAL:  Good  CLINICAL DECISION MAKING: Stable/uncomplicated  EVALUATION COMPLEXITY: Low  PLAN: PT FREQUENCY: 1x/week  PT DURATION: 8 weeks  PLANNED INTERVENTIONS: Therapeutic exercises, Therapeutic activity, Neuromuscular re-education, Balance training, Gait training, Patient/Family education, Self Care, Stair training, and DME instructions  PLAN FOR NEXT SESSION: issue HEP for balance and strengthening   Murriel Holwerda, [place], PT 11/09/2021, 7:20 PM

## 2021-11-09 ENCOUNTER — Encounter: Payer: Self-pay | Admitting: Physical Therapy

## 2021-11-15 ENCOUNTER — Ambulatory Visit: Payer: Medicare Other | Admitting: Physical Therapy

## 2021-11-15 DIAGNOSIS — R2689 Other abnormalities of gait and mobility: Secondary | ICD-10-CM | POA: Diagnosis not present

## 2021-11-15 DIAGNOSIS — M6281 Muscle weakness (generalized): Secondary | ICD-10-CM

## 2021-11-15 DIAGNOSIS — R2681 Unsteadiness on feet: Secondary | ICD-10-CM

## 2021-11-16 ENCOUNTER — Encounter: Payer: Self-pay | Admitting: Physical Therapy

## 2021-11-16 NOTE — Therapy (Signed)
OUTPATIENT PHYSICAL THERAPY NEURO TREATMENT NOTE   Patient Name: Jeanne Lawson MRN: [id number] DOB:02-13-1945, 76 y.o., female Today's Date: 11/16/2021   PCP: [name], MD  REFERRING PROVIDER: [name], MD    PT End of Session - 11/16/21 1958     Visit Number 2    Number of Visits 9    Authorization Type [employer] Medicare    Authorization Time Period 11-08-21 - 01-22-22    PT Start Time 0937    PT Stop Time 1021    PT Time Calculation (min) 44 min    Equipment Utilized During Treatment Gait belt    Activity Tolerance Patient tolerated treatment well    Behavior During Therapy [facility] for tasks assessed/performed             Past Medical History:  Diagnosis Date   Acute on chronic diastolic CHF (congestive heart failure) ([place]) 04/16/2021   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    COPD (chronic obstructive pulmonary disease) ([place])    Coronary artery disease 1996   Known with prior mild lesion of LAD demonstrated by Cardiac Catheterization in 1996   Edema of foot    She has a history of chronic edema of the left dated back to age 34 when she suufered severe frostbite playing  on the snow as a child   Hypertension    Lower extremity edema    PAC (premature atrial contraction)    Pancreatitis    x2   PONV (postoperative nausea and vomiting)    problems waking up last time 4/16 only time   Saccular aneurysm    She also has 2 known which were stable between the MRA of October2010 and the MRA  of April 2011.   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 Has normal EF.   Past Surgical History:  Procedure Laterality Date   ABDOMINAL HYSTERECTOMY      APPENDECTOMY     CARDIAC CATHETERIZATION  1996   Mild CAD with vasospasm   CARDIOVASCULAR STRESS TEST  12-02-2001   EF 70%   CHOLECYSTECTOMY N/A 03/09/2016   Procedure: LAPAROSCOPIC CHOLECYSTECTOMY WITH INTRAOPERATIVE CHOLANGIOGRAM;  Surgeon: Georganna Skeans, MD;  Location: [place];  Service: General;  Laterality: N/A;   ENDARTERECTOMY Right 08/12/2014   Procedure: RIGHT  COMMON CAROTID ARTERY EXPOSURE FOR INTERVENTIONAL RADIOLOGY PROCEDURE BY DR.DEVASHWAR,Insertion 6 FR sheath;  Surgeon: Elam Dutch, MD;  Location: [place];  Service: Vascular;  Laterality: Right;   ENDARTERECTOMY Right 08/12/2014   Procedure:  CAROTID  EXPOSURE CLOSURE RIGHT NECK, REPAIR RIGHT COMMON CAROTID ARTERY;  Surgeon: Elam Dutch, MD;  Location: [place];  Service: Vascular;  Laterality: Right;   ENDARTERECTOMY Left 11/18/2014   Procedure: CAROTID EXPOSURE;  Surgeon: Elam Dutch, MD;  Location: [place];  Service: Vascular;  Laterality: Left;   ENDARTERECTOMY Left 11/18/2014   Procedure: CLOSURE CAROTID;  Surgeon: Elam Dutch, MD;  Location: [place];  Service: Vascular;  Laterality: Left;   ENDARTERECTOMY Right 08/22/2017   Procedure: EXPOSURE CAROTID ARTERY FOR [place];  Surgeon: Elam Dutch, MD;  Location: [place];  Service: Vascular;  Laterality: Right;   IR ANGIO INTRA EXTRACRAN SEL INTERNAL CAROTID UNI R MOD SED  08/22/2017   IR ANGIOGRAM FOLLOW UP STUDY  08/22/2017   IR RADIOLOGIST EVAL & MGMT  06/26/2016   IR RADIOLOGIST EVAL & MGMT  05/08/2017   IR RADIOLOGY PERIPHERAL GUIDED IV START  12/03/2017   IR TRANSCATH/EMBOLIZ  08/22/2017   IR US GUIDE VASC ACCESS RIGHT  12/03/2017   LAPAROSCOPY     NECK SURGERY  50 yrs ago   Left side tumor   RADIOLOGY WITH ANESTHESIA N/A 05/25/2014   Procedure: RADIOLOGY WITH ANESTHESIA;  Surgeon: [name], MD;  Location: [place];  Service: Radiology;  Laterality: N/A;   RADIOLOGY WITH ANESTHESIA N/A 08/12/2014   Procedure: RADIOLOGY WITH ANESTHESIA;  Surgeon:  [name], MD;  Location: [place];  Service: Radiology;  Laterality: N/A;   RADIOLOGY WITH ANESTHESIA N/A 03/15/2015   Procedure: MRI OF BRAIN WITHOUT CONTRAST;  Surgeon: Medication Radiologist, MD;  Location: [place];  Service: Radiology;  Laterality: N/A;  DR. TAT/MRI   US ECHOCARDIOGRAPHY  04-26-2009   EF 65-70%   VESICOVAGINAL FISTULA CLOSURE W/ TAH  25 yrs ago   WOUND EXPLORATION Right 08/22/2017   Procedure: REMOVAL OF RIGHT COMMON CAROTID SHEATH AND WOUND CLOSURE;  Surgeon: Rosetta Posner, MD;  [facility];  Service: Vascular;  Laterality: Right;   Patient Active Problem List   Diagnosis Date Noted   Acute on chronic diastolic (congestive) heart failure ([place]) 06/30/2021   History of CVA (cerebrovascular accident) 06/28/2021   Chronic diastolic CHF (congestive heart failure) ([place]) 06/28/2021   Lymphedema    Acute on chronic diastolic CHF (congestive heart failure) ([place]) 04/16/2021   COPD (chronic obstructive pulmonary disease) ([place])    Acute encephalopathy 04/15/2021   Edema of both lower extremities due to peripheral venous insufficiency 07/13/2020   Acute cholecystitis 03/07/2016   Small vessel disease, cerebrovascular 05/31/2015   Aneurysm, cerebral, nonruptured 05/31/2015   Dizziness and giddiness 05/31/2015   Altered mental status    Acute CVA (cerebrovascular accident) ([place]) 03/15/2015   Dysphasia 03/12/2015   Thrombocytopenia ([place]) 03/12/2015   Hypokalemia    Endotracheally intubated    Essential hypertension    Intracranial aneurysm 11/18/2014   Respiratory failure ([facility])    Cerebral aneurysm    Brain aneurysm    Aphasia    Palmar erythema    CVA (cerebral infarction) 05/02/2014   Aneurysm ([place]) 05/02/2014   Expressive aphasia 05/02/2014   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   Tobacco abuse 10/24/2010   TIA (transient ischemic attack)    Hypertension    Ventricular hypertrophy    CVA 02/23/2010   STRESS FRACTURE, FOOT  02/22/2010    ONSET DATE: March 2023:  Referral date 10-28-21  REFERRING DIAG: R26.0 (ICD-10-CM) - Ataxic gait I67.1 (ICD-10-CM) - Cerebral aneurysm, nonruptured   THERAPY DIAG:  Other abnormalities of gait and mobility  Unsteadiness on feet  Muscle weakness (generalized)  Rationale for Evaluation and Treatment Rehabilitation  SUBJECTIVE:  SUBJECTIVE STATEMENT: Pt reports she continues to lose fluid in her legs - will have the wrapped with compression stockings tomorrow and will leave on for 2 days; reports she continues to walk with walker in her apartment Pt accompanied by: self  PERTINENT HISTORY:  Jeanne Lawson is a 76 y.o. female with medical history significant of chronic diastolic CHF, CVA, chronic severe lymphedema, COPD and hypertension who presented with worsening swelling of the lower extremity.   PAIN:  Are you having pain? Yes: NPRS scale: 4/10 Pain location: low back and Rt knee Pain description: soreness Aggravating factors: weight bearing Relieving factors: non weight bearing positions Pt reports constant pain in low back and Rt knee;   PRECAUTIONS: None  WEIGHT BEARING RESTRICTIONS No  FALLS: Has patient fallen in last 6 months? No  LIVING ENVIRONMENT: Lives with: lives alone and lives in an assisted living facility Lives in: Other assisted living facility Has following equipment at home: Single point cane, Environmental consultant - 2 wheeled, Electronics engineer, and Grab bars  PLOF: Independent with basic ADLs, Independent with household mobility with device, and Independent with transfers  PATIENT GOALS "to walk with my cane"  Today's Treatment:  Gait:  Pt gait trained 115' without device with HHA +1 with cues to increase fluidity of gait with less hesitation after stepping with  LLE  2nd rep:  75' x 1 rep without device with min HHA  Step training with Rt hand rail using step by step sequence with CGA  Ramp training without device with min to mod HHA with both ascension and descension   Curb training - no device - with mod assist for stepping up onto curb; cues for foot placement prior to descending curb; mod assist with descension  TherEx: Heel raises 10 reps bil. LE's Unilateral heel raise each leg with bil. UE support 10 reps  Runner's stretch 30 sec hold x 1 rep each leg  Sit to stand - 3 reps from 19" height mat table with RUE support needed; from higher mat table - 22' height without UE support 5 reps (RW in front)   NeuroRe-ed: Stepping over and back of black balance beam 5 reps each with HHA +1   PATIENT EDUCATION: Education details: sit to stand for functional strengthening Person educated: Patient Education method: Explanation Education comprehension: verbalized understanding   HOME EXERCISE PROGRAM: To be established    GOALS: Goals reviewed with patient? Yes  SHORT TERM GOALS: Target date: 12/09/2021   PT Short Term Goals - 11/09/21 1947       PT SHORT TERM GOAL #1   Title Increase gait velocity to >/= 1.9 ft/sec with RW for incr. gait efficiency.    Time 4    Period Weeks    Status New    Target Date 12/09/21      PT SHORT TERM GOAL #2   Title Pt will improve TUG score to </= 16 secs with RW to demo improved functional mobility.    Time 4    Period Weeks    Status New    Target Date 12/09/21      PT SHORT TERM GOAL #3   Title Pt will amb. 58' with SPC with SBA for increased safety with household and community mobility.    Time 4    Period Weeks    Status New    Target Date 12/09/21      PT SHORT TERM GOAL #4   Title Independent in HEP for balance, ROM  and strengthening.    Time 4    Period Weeks    Status New    Target Date 12/09/21              LONG TERM GOALS: Target date: 01/06/2022   PT Long Term  Goals - 11/09/21 1949       PT LONG TERM GOAL #1   Title Independent in updated HEP for bil. strengthening and balance exercises.    Time 8    Period Weeks    Status New    Target Date 01/06/22      PT LONG TERM GOAL #2   Title Improve 5x sit to stand score from 13.22 secs to </= 10 secs without UE support from mat.    Baseline 13.22 from mat    Time 8    Period Weeks    Status New    Target Date 01/06/22      PT LONG TERM GOAL #3   Title Improve TUG score to </= 17 secs with SPC to demo improved mobility.    Baseline 20.37 secs with RW    Time 8    Period Weeks    Status New      PT LONG TERM GOAL #4   Title Incr. gait velocity to >/= 2.0  ft/sec with SPC for incr. gait efficiency.    Baseline 1.55 ft/sec with RW    Time 8    Period Weeks    Status New    Target Date 01/06/22           ASSESSMENT:  CLINICAL IMPRESSION: PT session focused on gait training without device with HHA only with cues for more normal gait pattern with less hesitation.  Pt able to perform unilateral heel lifts with bil. UE support for first time in today's session. Decreased lymphedema in bil. LE's allowing increased flexibility and increased ease with active movement.  Pt is progressing well towards STG's.  Cont with POC.   OBJECTIVE IMPAIRMENTS decreased balance, decreased endurance, difficulty walking, decreased ROM, decreased strength, increased edema, and pain.   ACTIVITY LIMITATIONS carrying, lifting, bending, standing, squatting, stairs, transfers, and locomotion level  PARTICIPATION LIMITATIONS: meal prep, cleaning, laundry, driving, and shopping  PERSONAL FACTORS Past/current experiences, Time since onset of injury/illness/exacerbation, and 1 comorbidity: lymphedema  are also affecting patient's functional outcome.   REHAB POTENTIAL:  Good  CLINICAL DECISION MAKING: Stable/uncomplicated  EVALUATION COMPLEXITY: Low  PLAN: PT FREQUENCY: 1x/week  PT DURATION: 8  weeks  PLANNED INTERVENTIONS: Therapeutic exercises, Therapeutic activity, Neuromuscular re-education, Balance training, Gait training, Patient/Family education, Self Care, Stair training, and DME instructions  PLAN FOR NEXT SESSION: issue HEP for balance and strengthening   Olevia Westervelt, [place], PT 11/16/2021, 8:01 PM

## 2021-11-22 ENCOUNTER — Ambulatory Visit: Payer: Medicare Other | Admitting: Physical Therapy

## 2021-11-22 ENCOUNTER — Encounter: Payer: Self-pay | Admitting: Physical Therapy

## 2021-11-22 DIAGNOSIS — R2689 Other abnormalities of gait and mobility: Secondary | ICD-10-CM | POA: Diagnosis not present

## 2021-11-22 DIAGNOSIS — R2681 Unsteadiness on feet: Secondary | ICD-10-CM

## 2021-11-22 DIAGNOSIS — M6281 Muscle weakness (generalized): Secondary | ICD-10-CM

## 2021-11-22 NOTE — Therapy (Signed)
OUTPATIENT PHYSICAL THERAPY NEURO TREATMENT NOTE   Patient Name: Jeanne Lawson MRN: [id number] DOB:[birth year], 76 y.o., female Today's Date: 11/22/2021   PCP: [name], MD  REFERRING PROVIDER: [name], MD    PT End of Session - 11/22/21 0847     Visit Number 3    Number of Visits 9    Authorization Type [employer] Medicare    Authorization Time Period 11-08-21 - 01-22-22    PT Start Time 0848    PT Stop Time 0930    PT Time Calculation (min) 42 min    Equipment Utilized During Treatment Gait belt    Activity Tolerance Patient tolerated treatment well    Behavior During Therapy [facility] for tasks assessed/performed             Past Medical History:  Diagnosis Date   Acute on chronic diastolic CHF (congestive heart failure) ([place]) 04/16/2021   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    COPD (chronic obstructive pulmonary disease) ([place])    Coronary artery disease 1996   Known with prior mild lesion of LAD demonstrated by Cardiac Catheterization in 1996   Edema of foot    She has a history of chronic edema of the left dated back to age 46 when she suufered severe frostbite playing  on the snow as a child   Hypertension    Lower extremity edema    PAC (premature atrial contraction)    Pancreatitis    x2   PONV (postoperative nausea and vomiting)    problems waking up last time 4/16 only time   Saccular aneurysm    She also has 2 known which were stable between the MRA of October2010 and the MRA  of April 2011.   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 Has normal EF.   Past Surgical History:  Procedure Laterality Date   ABDOMINAL HYSTERECTOMY      APPENDECTOMY     CARDIAC CATHETERIZATION  1996   Mild CAD with vasospasm   CARDIOVASCULAR STRESS TEST  12-02-2001   EF 70%   CHOLECYSTECTOMY N/A 03/09/2016   Procedure: LAPAROSCOPIC CHOLECYSTECTOMY WITH INTRAOPERATIVE CHOLANGIOGRAM;  Surgeon: Georganna Skeans, MD;  Location: [place];  Service: General;  Laterality: N/A;   ENDARTERECTOMY Right 08/12/2014   Procedure: RIGHT  COMMON CAROTID ARTERY EXPOSURE FOR INTERVENTIONAL RADIOLOGY PROCEDURE BY DR.DEVASHWAR,Insertion 6 FR sheath;  Surgeon: Elam Dutch, MD;  Location: [place];  Service: Vascular;  Laterality: Right;   ENDARTERECTOMY Right 08/12/2014   Procedure:  CAROTID  EXPOSURE CLOSURE RIGHT NECK, REPAIR RIGHT COMMON CAROTID ARTERY;  Surgeon: Elam Dutch, MD;  Location: [place];  Service: Vascular;  Laterality: Right;   ENDARTERECTOMY Left 11/18/2014   Procedure: CAROTID EXPOSURE;  Surgeon: Elam Dutch, MD;  Location: [place];  Service: Vascular;  Laterality: Left;   ENDARTERECTOMY Left 11/18/2014   Procedure: CLOSURE CAROTID;  Surgeon: Elam Dutch, MD;  Location: [place];  Service: Vascular;  Laterality: Left;   ENDARTERECTOMY Right 08/22/2017   Procedure: EXPOSURE CAROTID ARTERY FOR [place];  Surgeon: Elam Dutch, MD;  Location: [place];  Service: Vascular;  Laterality: Right;   IR ANGIO INTRA EXTRACRAN SEL INTERNAL CAROTID UNI R MOD SED  08/22/2017   IR ANGIOGRAM FOLLOW UP STUDY  08/22/2017   IR RADIOLOGIST EVAL & MGMT  06/26/2016   IR RADIOLOGIST EVAL & MGMT  05/08/2017   IR RADIOLOGY PERIPHERAL GUIDED IV START  12/03/2017   IR TRANSCATH/EMBOLIZ  08/22/2017   IR US GUIDE VASC ACCESS RIGHT  12/03/2017   LAPAROSCOPY     NECK SURGERY  50 yrs ago   Left side tumor   RADIOLOGY WITH ANESTHESIA N/A 05/25/2014   Procedure: RADIOLOGY WITH ANESTHESIA;  Surgeon: [name], MD;  Location: [place];  Service: Radiology;  Laterality: N/A;   RADIOLOGY WITH ANESTHESIA N/A 08/12/2014   Procedure: RADIOLOGY WITH ANESTHESIA;  Surgeon:  [name], MD;  Location: [place];  Service: Radiology;  Laterality: N/A;   RADIOLOGY WITH ANESTHESIA N/A 03/15/2015   Procedure: MRI OF BRAIN WITHOUT CONTRAST;  Surgeon: Medication Radiologist, MD;  Location: [place];  Service: Radiology;  Laterality: N/A;  DR. TAT/MRI   US ECHOCARDIOGRAPHY  04-26-2009   EF 65-70%   VESICOVAGINAL FISTULA CLOSURE W/ TAH  25 yrs ago   WOUND EXPLORATION Right 08/22/2017   Procedure: REMOVAL OF RIGHT COMMON CAROTID SHEATH AND WOUND CLOSURE;  Surgeon: Rosetta Posner, MD;  [facility];  Service: Vascular;  Laterality: Right;   Patient Active Problem List   Diagnosis Date Noted   Acute on chronic diastolic (congestive) heart failure ([place]) 06/30/2021   History of CVA (cerebrovascular accident) 06/28/2021   Chronic diastolic CHF (congestive heart failure) ([place]) 06/28/2021   Lymphedema    Acute on chronic diastolic CHF (congestive heart failure) ([place]) 04/16/2021   COPD (chronic obstructive pulmonary disease) ([place])    Acute encephalopathy 04/15/2021   Edema of both lower extremities due to peripheral venous insufficiency 07/13/2020   Acute cholecystitis 03/07/2016   Small vessel disease, cerebrovascular 05/31/2015   Aneurysm, cerebral, nonruptured 05/31/2015   Dizziness and giddiness 05/31/2015   Altered mental status    Acute CVA (cerebrovascular accident) ([place]) 03/15/2015   Dysphasia 03/12/2015   Thrombocytopenia ([place]) 03/12/2015   Hypokalemia    Endotracheally intubated    Essential hypertension    Intracranial aneurysm 11/18/2014   Respiratory failure ([facility])    Cerebral aneurysm    Brain aneurysm    Aphasia    Palmar erythema    CVA (cerebral infarction) 05/02/2014   Aneurysm ([place]) 05/02/2014   Expressive aphasia 05/02/2014   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   Tobacco abuse 10/24/2010   TIA (transient ischemic attack)    Hypertension    Ventricular hypertrophy    CVA 02/23/2010   STRESS FRACTURE, FOOT  02/22/2010    ONSET DATE: March 2023:  Referral date 10-28-21  REFERRING DIAG: R26.0 (ICD-10-CM) - Ataxic gait I67.1 (ICD-10-CM) - Cerebral aneurysm, nonruptured   THERAPY DIAG:  Other abnormalities of gait and mobility  Unsteadiness on feet  Muscle weakness (generalized)  Rationale for Evaluation and Treatment Rehabilitation  SUBJECTIVE:  SUBJECTIVE STATEMENT: Pt reports her legs were so small yesterday that the wrappings were not needed  - only needed to wrap her feet yesterday and not the lower legs.   Pt accompanied by: self  PERTINENT HISTORY:  Jeanne Lawson is a 76 y.o. female with medical history significant of chronic diastolic CHF, CVA, chronic severe lymphedema, COPD and hypertension who presented with worsening swelling of the lower extremity.   PAIN:  Are you having pain? Yes: NPRS scale: 1-2/10 Pain location: low back and Rt knee Pain description: soreness Aggravating factors: weight bearing Relieving factors: non weight bearing positions Pt reports constant pain in low back and Rt knee;   PRECAUTIONS: None  WEIGHT BEARING RESTRICTIONS No  FALLS: Has patient fallen in last 6 months? No  LIVING ENVIRONMENT: Lives with: lives alone and lives in an assisted living facility Lives in: Other assisted living facility Has following equipment at home: Single point cane, Environmental consultant - 2 wheeled, Electronics engineer, and Grab bars  PLOF: Independent with basic ADLs, Independent with household mobility with device, and Independent with transfers  PATIENT GOALS "to walk with my cane"  Today's Treatment:  Gait:  Pt gait trained 115' without device with HHA +1 with cues to increase fluidity of gait with less hesitation after stepping with LLE  2nd rep:  75' x 1 rep without device with min  HHA  Step training with Rt hand rail using step by step sequence with CGA  Ramp training without device with min to mod HHA with both ascension and descension   Curb training - no device - with mod assist for stepping up onto curb; cues for foot placement prior to descending curb; mod assist with descension  TherEx: Heel raises 10 reps bil. LE's Unilateral heel raise each leg with bil. UE support 10 reps  Runner's stretch 30 sec hold x 1 rep each leg  Sit to stand - 3 reps from 19" height mat table with RUE support needed; from higher mat table - 22' height without UE support 5 reps (RW in front)   NeuroRe-ed: Stepping over and back of black balance beam 5 reps each with HHA +1   PATIENT EDUCATION: Education details: sit to stand for functional strengthening Person educated: Patient Education method: Explanation Education comprehension: verbalized understanding   HOME EXERCISE PROGRAM: To be established    GOALS: Goals reviewed with patient? Yes  SHORT TERM GOALS: Target date: 12/09/2021   PT Short Term Goals - 11/09/21 1947       PT SHORT TERM GOAL #1   Title Increase gait velocity to >/= 1.9 ft/sec with RW for incr. gait efficiency.    Time 4    Period Weeks    Status New    Target Date 12/09/21      PT SHORT TERM GOAL #2   Title Pt will improve TUG score to </= 16 secs with RW to demo improved functional mobility.    Time 4    Period Weeks    Status New    Target Date 12/09/21      PT SHORT TERM GOAL #3   Title Pt will amb. 26' with SPC with SBA for increased safety with household and community mobility.    Time 4    Period Weeks    Status New    Target Date 12/09/21      PT SHORT TERM GOAL #4   Title Independent in HEP for balance, ROM and strengthening.    Time  4    Period Weeks    Status New    Target Date 12/09/21              LONG TERM GOALS: Target date: 01/06/2022   PT Long Term Goals - 11/09/21 1949       PT LONG TERM GOAL #1    Title Independent in updated HEP for bil. strengthening and balance exercises.    Time 8    Period Weeks    Status New    Target Date 01/06/22      PT LONG TERM GOAL #2   Title Improve 5x sit to stand score from 13.22 secs to </= 10 secs without UE support from mat.    Baseline 13.22 from mat    Time 8    Period Weeks    Status New    Target Date 01/06/22      PT LONG TERM GOAL #3   Title Improve TUG score to </= 17 secs with SPC to demo improved mobility.    Baseline 20.37 secs with RW    Time 8    Period Weeks    Status New      PT LONG TERM GOAL #4   Title Incr. gait velocity to >/= 2.0  ft/sec with SPC for incr. gait efficiency.    Baseline 1.55 ft/sec with RW    Time 8    Period Weeks    Status New    Target Date 01/06/22           ASSESSMENT:  CLINICAL IMPRESSION: PT session focused on gait training without device with HHA only with cues for more normal gait pattern with less hesitation.  Pt able to perform unilateral heel lifts with bil. UE support for first time in today's session. Decreased lymphedema in bil. LE's allowing increased flexibility and increased ease with active movement.  Pt is progressing well towards STG's.  Cont with POC.   OBJECTIVE IMPAIRMENTS decreased balance, decreased endurance, difficulty walking, decreased ROM, decreased strength, increased edema, and pain.   ACTIVITY LIMITATIONS carrying, lifting, bending, standing, squatting, stairs, transfers, and locomotion level  PARTICIPATION LIMITATIONS: meal prep, cleaning, laundry, driving, and shopping  PERSONAL FACTORS Past/current experiences, Time since onset of injury/illness/exacerbation, and 1 comorbidity: lymphedema  are also affecting patient's functional outcome.   REHAB POTENTIAL:  Good  CLINICAL DECISION MAKING: Stable/uncomplicated  EVALUATION COMPLEXITY: Low  PLAN: PT FREQUENCY: 1x/week  PT DURATION: 8 weeks  PLANNED INTERVENTIONS: Therapeutic exercises, Therapeutic  activity, Neuromuscular re-education, Balance training, Gait training, Patient/Family education, Self Care, Stair training, and DME instructions  PLAN FOR NEXT SESSION: issue HEP for balance and strengthening   Aiyah Scarpelli, [place], PT 11/22/2021, 8:14 PM

## 2021-11-29 ENCOUNTER — Encounter: Payer: Self-pay | Admitting: Physical Therapy

## 2021-11-29 ENCOUNTER — Ambulatory Visit: Payer: Medicare Other | Attending: Internal Medicine | Admitting: Physical Therapy

## 2021-11-29 DIAGNOSIS — R2689 Other abnormalities of gait and mobility: Secondary | ICD-10-CM | POA: Insufficient documentation

## 2021-11-29 DIAGNOSIS — M6281 Muscle weakness (generalized): Secondary | ICD-10-CM | POA: Insufficient documentation

## 2021-11-29 DIAGNOSIS — R2681 Unsteadiness on feet: Secondary | ICD-10-CM | POA: Diagnosis present

## 2021-11-29 NOTE — Therapy (Signed)
OUTPATIENT PHYSICAL THERAPY NEURO TREATMENT NOTE   Patient Name: Jeanne Lawson MRN: [id number] DOB:10-Jan-1946, 76 y.o., female Today's Date: 11/30/2021   PCP: [name], MD  REFERRING PROVIDER: [name], MD    PT End of Session - 11/30/21 1626     Visit Number 4    Number of Visits 9    Authorization Type [employer] Medicare    Authorization Time Period 11-08-21 - 01-22-22    PT Start Time 0930    PT Stop Time 1016    PT Time Calculation (min) 46 min    Equipment Utilized During Treatment Gait belt    Activity Tolerance Patient tolerated treatment well    Behavior During Therapy [facility] for tasks assessed/performed              Past Medical History:  Diagnosis Date   Acute on chronic diastolic CHF (congestive heart failure) ([place]) 04/16/2021   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    COPD (chronic obstructive pulmonary disease) ([place])    Coronary artery disease 1996   Known with prior mild lesion of LAD demonstrated by Cardiac Catheterization in 1996   Edema of foot    She has a history of chronic edema of the left dated back to age 28 when she suufered severe frostbite playing  on the snow as a child   Hypertension    Lower extremity edema    PAC (premature atrial contraction)    Pancreatitis    x2   PONV (postoperative nausea and vomiting)    problems waking up last time 4/16 only time   Saccular aneurysm    She also has 2 known which were stable between the MRA of October2010 and the MRA  of April 2011.   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 Has normal EF.   Past Surgical History:  Procedure Laterality Date   ABDOMINAL HYSTERECTOMY      APPENDECTOMY     CARDIAC CATHETERIZATION  1996   Mild CAD with vasospasm   CARDIOVASCULAR STRESS TEST  12-02-2001   EF 70%   CHOLECYSTECTOMY N/A 03/09/2016   Procedure: LAPAROSCOPIC CHOLECYSTECTOMY WITH INTRAOPERATIVE CHOLANGIOGRAM;  Surgeon: Georganna Skeans, MD;  Location: [place];  Service: General;  Laterality: N/A;   ENDARTERECTOMY Right 08/12/2014   Procedure: RIGHT  COMMON CAROTID ARTERY EXPOSURE FOR INTERVENTIONAL RADIOLOGY PROCEDURE BY DR.DEVASHWAR,Insertion 6 FR sheath;  Surgeon: Elam Dutch, MD;  Location: [place];  Service: Vascular;  Laterality: Right;   ENDARTERECTOMY Right 08/12/2014   Procedure:  CAROTID  EXPOSURE CLOSURE RIGHT NECK, REPAIR RIGHT COMMON CAROTID ARTERY;  Surgeon: Elam Dutch, MD;  Location: [place];  Service: Vascular;  Laterality: Right;   ENDARTERECTOMY Left 11/18/2014   Procedure: CAROTID EXPOSURE;  Surgeon: Elam Dutch, MD;  Location: [place];  Service: Vascular;  Laterality: Left;   ENDARTERECTOMY Left 11/18/2014   Procedure: CLOSURE CAROTID;  Surgeon: Elam Dutch, MD;  Location: [place];  Service: Vascular;  Laterality: Left;   ENDARTERECTOMY Right 08/22/2017   Procedure: EXPOSURE CAROTID ARTERY FOR [place];  Surgeon: Elam Dutch, MD;  Location: [place];  Service: Vascular;  Laterality: Right;   IR ANGIO INTRA EXTRACRAN SEL INTERNAL CAROTID UNI R MOD SED  08/22/2017   IR ANGIOGRAM FOLLOW UP STUDY  08/22/2017   IR RADIOLOGIST EVAL & MGMT  06/26/2016   IR RADIOLOGIST EVAL & MGMT  05/08/2017   IR RADIOLOGY PERIPHERAL GUIDED IV START  12/03/2017   IR TRANSCATH/EMBOLIZ  08/22/2017   IR US GUIDE VASC ACCESS RIGHT  12/03/2017   LAPAROSCOPY     NECK SURGERY  50 yrs ago   Left side tumor   RADIOLOGY WITH ANESTHESIA N/A 05/25/2014   Procedure: RADIOLOGY WITH ANESTHESIA;  Surgeon: [name], MD;  Location: [place];  Service: Radiology;  Laterality: N/A;   RADIOLOGY WITH ANESTHESIA N/A 08/12/2014   Procedure: RADIOLOGY WITH ANESTHESIA;  Surgeon:  [name], MD;  Location: [place];  Service: Radiology;  Laterality: N/A;   RADIOLOGY WITH ANESTHESIA N/A 03/15/2015   Procedure: MRI OF BRAIN WITHOUT CONTRAST;  Surgeon: Medication Radiologist, MD;  Location: [place];  Service: Radiology;  Laterality: N/A;  DR. TAT/MRI   US ECHOCARDIOGRAPHY  04-26-2009   EF 65-70%   VESICOVAGINAL FISTULA CLOSURE W/ TAH  25 yrs ago   WOUND EXPLORATION Right 08/22/2017   Procedure: REMOVAL OF RIGHT COMMON CAROTID SHEATH AND WOUND CLOSURE;  Surgeon: Rosetta Posner, MD;  [facility];  Service: Vascular;  Laterality: Right;   Patient Active Problem List   Diagnosis Date Noted   Acute on chronic diastolic (congestive) heart failure ([place]) 06/30/2021   History of CVA (cerebrovascular accident) 06/28/2021   Chronic diastolic CHF (congestive heart failure) ([place]) 06/28/2021   Lymphedema    Acute on chronic diastolic CHF (congestive heart failure) ([place]) 04/16/2021   COPD (chronic obstructive pulmonary disease) ([place])    Acute encephalopathy 04/15/2021   Edema of both lower extremities due to peripheral venous insufficiency 07/13/2020   Acute cholecystitis 03/07/2016   Small vessel disease, cerebrovascular 05/31/2015   Aneurysm, cerebral, nonruptured 05/31/2015   Dizziness and giddiness 05/31/2015   Altered mental status    Acute CVA (cerebrovascular accident) ([place]) 03/15/2015   Dysphasia 03/12/2015   Thrombocytopenia ([place]) 03/12/2015   Hypokalemia    Endotracheally intubated    Essential hypertension    Intracranial aneurysm 11/18/2014   Respiratory failure ([facility])    Cerebral aneurysm    Brain aneurysm    Aphasia    Palmar erythema    CVA (cerebral infarction) 05/02/2014   Aneurysm ([place]) 05/02/2014   Expressive aphasia 05/02/2014   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   Tobacco abuse 10/24/2010   TIA (transient ischemic attack)    Hypertension    Ventricular hypertrophy    CVA 02/23/2010   STRESS FRACTURE, FOOT  02/22/2010    ONSET DATE: March 2023:  Referral date 10-28-21  REFERRING DIAG: R26.0 (ICD-10-CM) - Ataxic gait I67.1 (ICD-10-CM) - Cerebral aneurysm, nonruptured   THERAPY DIAG:  Other abnormalities of gait and mobility  Unsteadiness on feet  Muscle weakness (generalized)  Rationale for Evaluation and Treatment Rehabilitation  SUBJECTIVE:  SUBJECTIVE STATEMENT: Pt reports she is leaving for the beach with her daughter on this coming Sunday - will be gone for 2 weeks;  is thinking about taking the cane with her in addition to the walker Pt accompanied by: self  PERTINENT HISTORY:  Jeanne Lawson is a 76 y.o. female with medical history significant of chronic diastolic CHF, CVA, chronic severe lymphedema, COPD and hypertension who presented with worsening swelling of the lower extremity.   PAIN:  Are you having pain? Yes: NPRS scale: 2-3/10 Pain location: low back and Rt knee Pain description: soreness Aggravating factors: weight bearing Relieving factors: non weight bearing positions Pt reports constant pain in low back and Rt knee;    Back pain varies  PRECAUTIONS: None  WEIGHT BEARING RESTRICTIONS No  FALLS: Has patient fallen in last 6 months? No  LIVING ENVIRONMENT: Lives with: lives alone and lives in an assisted living facility Lives in: Other assisted living facility Has following equipment at home: Single point cane, Environmental consultant - 2 wheeled, Electronics engineer, and Grab bars  PLOF: Independent with basic ADLs, Independent with household mobility with device, and Independent with transfers  PATIENT GOALS "to walk with my cane"  Today's Treatment:   11-29-21  Gait:  Pt gait trained 115' inside without device with min HHA with cues to increase fluidity of gait with less hesitation after  stepping with LLE  SPC used for gait training outside approx. 100' on pavement with CGA; pt negotiated curb with SPC with cues for correct sequence with CGA  Step training with bil. hand rails using step by step sequence with CGA - 5 reps x 4 steps with bil. Hand rails to simulate 20 steps at the beach house to which she is going next week   TherEx: Heel raises 10 reps bil. LE's Unilateral heel raise each leg with bil. UE support on counter 10 reps  Runner's stretch 30 sec hold x 1 rep each leg  Sit to stand - 5 reps from chair without UE support - more challenging from chair as pt initially performed sit to stand from high/low mat table without difficulty  NeuroRe-ed: Tap ups to 1st step with CGA to min assist 5 reps each leg  Touching 3 stepping stones (balance bubbles) with CGA  with LUE support on counter -  3 reps with each foot for improved SLS on each leg and then diagonal pattern 3 reps with each foot    PATIENT EDUCATION: Education details: sit to stand for functional strengthening Person educated: Patient Education method: Explanation Education comprehension: verbalized understanding   HOME EXERCISE PROGRAM: To be established    GOALS: Goals reviewed with patient? Yes  SHORT TERM GOALS: Target date: 12/09/2021   PT Short Term Goals - 11/09/21 1947       PT SHORT TERM GOAL #1   Title Increase gait velocity to >/= 1.9 ft/sec with RW for incr. gait efficiency.    Time 4    Period Weeks    Status New    Target Date 12/09/21      PT SHORT TERM GOAL #2   Title Pt will improve TUG score to </= 16 secs with RW to demo improved functional mobility.    Time 4    Period Weeks    Status New    Target Date 12/09/21      PT SHORT TERM GOAL #3   Title Pt will amb. 77' with SPC with SBA for increased safety with household and  community mobility.    Time 4    Period Weeks    Status New    Target Date 12/09/21      PT SHORT TERM GOAL #4   Title Independent in  HEP for balance, ROM and strengthening.    Time 4    Period Weeks    Status New    Target Date 12/09/21              LONG TERM GOALS: Target date: 01/06/2022   PT Long Term Goals - 11/09/21 1949       PT LONG TERM GOAL #1   Title Independent in updated HEP for bil. strengthening and balance exercises.    Time 8    Period Weeks    Status New    Target Date 01/06/22      PT LONG TERM GOAL #2   Title Improve 5x sit to stand score from 13.22 secs to </= 10 secs without UE support from mat.    Baseline 13.22 from mat    Time 8    Period Weeks    Status New    Target Date 01/06/22      PT LONG TERM GOAL #3   Title Improve TUG score to </= 17 secs with SPC to demo improved mobility.    Baseline 20.37 secs with RW    Time 8    Period Weeks    Status New      PT LONG TERM GOAL #4   Title Incr. gait velocity to >/= 2.0  ft/sec with SPC for incr. gait efficiency.    Baseline 1.55 ft/sec with RW    Time 8    Period Weeks    Status New    Target Date 01/06/22           ASSESSMENT:  CLINICAL IMPRESSION: PT session focused on gait training without device on indoor surfaces and then with [facility] for gait training outside on paved surface for first time during this PT admission.  Pt able to negotiate actual curb outside with [facility] with cues for correct sequence.  Pt is progressing well with balance and gait.  Pt is going to be out of town for next 2 weeks.  Plan to cont with PT on 12-20-21.    OBJECTIVE IMPAIRMENTS decreased balance, decreased endurance, difficulty walking, decreased ROM, decreased strength, increased edema, and pain.   ACTIVITY LIMITATIONS carrying, lifting, bending, standing, squatting, stairs, transfers, and locomotion level  PARTICIPATION LIMITATIONS: meal prep, cleaning, laundry, driving, and shopping  PERSONAL FACTORS Past/current experiences, Time since onset of injury/illness/exacerbation, and 1 comorbidity: lymphedema  are also affecting patient's  functional outcome.   REHAB POTENTIAL:  Good  CLINICAL DECISION MAKING: Stable/uncomplicated  EVALUATION COMPLEXITY: Low  PLAN: PT FREQUENCY: 1x/week  PT DURATION: 8 weeks  PLANNED INTERVENTIONS: Therapeutic exercises, Therapeutic activity, Neuromuscular re-education, Balance training, Gait training, Patient/Family education, Self Care, Stair training, and DME instructions  PLAN FOR NEXT SESSION: Check STG's when pt returns from being out of town x 2 weeks:  issue HEP for balance and strengthening   Meeghan Skipper, [place], PT 11/30/2021, 4:29 PM

## 2021-11-30 ENCOUNTER — Encounter: Payer: Self-pay | Admitting: Physical Therapy

## 2021-12-06 ENCOUNTER — Ambulatory Visit: Payer: Self-pay | Admitting: Physical Therapy

## 2021-12-06 ENCOUNTER — Encounter: Payer: Self-pay | Admitting: Physical Therapy

## 2021-12-20 ENCOUNTER — Ambulatory Visit: Payer: Medicare Other | Admitting: Physical Therapy

## 2021-12-20 ENCOUNTER — Encounter: Payer: Self-pay | Admitting: Physical Therapy

## 2021-12-20 DIAGNOSIS — M6281 Muscle weakness (generalized): Secondary | ICD-10-CM

## 2021-12-20 DIAGNOSIS — R2689 Other abnormalities of gait and mobility: Secondary | ICD-10-CM

## 2021-12-20 DIAGNOSIS — R2681 Unsteadiness on feet: Secondary | ICD-10-CM

## 2021-12-20 NOTE — Therapy (Unsigned)
OUTPATIENT PHYSICAL THERAPY NEURO TREATMENT NOTE   Patient Name: Jeanne Lawson MRN: [id number] DOB:Jan 06, 1946, 76 y.o., female Today's Date: 12/21/2021   PCP: [name], MD  REFERRING PROVIDER: [name], MD    PT End of Session - 12/20/21 0927     Visit Number 5    Number of Visits 9    Authorization Type [employer]    Authorization Time Period 11-08-21 - 01-22-22    PT Start Time 0928    PT Stop Time 1015    PT Time Calculation (min) 47 min    Equipment Utilized During Treatment Gait belt    Activity Tolerance Patient tolerated treatment well    Behavior During Therapy [facility] for tasks assessed/performed              Past Medical History:  Diagnosis Date   Acute on chronic diastolic CHF (congestive heart failure) ([place]) 04/16/2021   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    COPD (chronic obstructive pulmonary disease) ([place])    Coronary artery disease 1996   Known with prior mild lesion of LAD demonstrated by Cardiac Catheterization in 1996   Edema of foot    She has a history of chronic edema of the left dated back to age 60 when she suufered severe frostbite playing  on the snow as a child   Hypertension    Lower extremity edema    PAC (premature atrial contraction)    Pancreatitis    x2   PONV (postoperative nausea and vomiting)    problems waking up last time 4/16 only time   Saccular aneurysm    She also has 2 known which were stable between the MRA of October2010 and the MRA  of April 2011.   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 Has normal EF.   Past Surgical History:  Procedure Laterality Date   ABDOMINAL HYSTERECTOMY      APPENDECTOMY     CARDIAC CATHETERIZATION  1996   Mild CAD with vasospasm   CARDIOVASCULAR STRESS TEST  12-02-2001   EF 70%   CHOLECYSTECTOMY N/A 03/09/2016   Procedure: LAPAROSCOPIC CHOLECYSTECTOMY WITH INTRAOPERATIVE CHOLANGIOGRAM;  Surgeon: Georganna Skeans, MD;  Location: [place];  Service: General;  Laterality: N/A;   ENDARTERECTOMY Right 08/12/2014   Procedure: RIGHT  COMMON CAROTID ARTERY EXPOSURE FOR INTERVENTIONAL RADIOLOGY PROCEDURE BY DR.DEVASHWAR,Insertion 6 FR sheath;  Surgeon: Elam Dutch, MD;  Location: [place];  Service: Vascular;  Laterality: Right;   ENDARTERECTOMY Right 08/12/2014   Procedure:  CAROTID  EXPOSURE CLOSURE RIGHT NECK, REPAIR RIGHT COMMON CAROTID ARTERY;  Surgeon: Elam Dutch, MD;  Location: [place];  Service: Vascular;  Laterality: Right;   ENDARTERECTOMY Left 11/18/2014   Procedure: CAROTID EXPOSURE;  Surgeon: Elam Dutch, MD;  Location: [place];  Service: Vascular;  Laterality: Left;   ENDARTERECTOMY Left 11/18/2014   Procedure: CLOSURE CAROTID;  Surgeon: Elam Dutch, MD;  Location: [place];  Service: Vascular;  Laterality: Left;   ENDARTERECTOMY Right 08/22/2017   Procedure: EXPOSURE CAROTID ARTERY FOR [place];  Surgeon: Elam Dutch, MD;  Location: [place];  Service: Vascular;  Laterality: Right;   IR ANGIO INTRA EXTRACRAN SEL INTERNAL CAROTID UNI R MOD SED  08/22/2017   IR ANGIOGRAM FOLLOW UP STUDY  08/22/2017   IR RADIOLOGIST EVAL & MGMT  06/26/2016   IR RADIOLOGIST EVAL & MGMT  05/08/2017   IR RADIOLOGY PERIPHERAL GUIDED IV START  12/03/2017   IR TRANSCATH/EMBOLIZ  08/22/2017   IR US GUIDE VASC ACCESS RIGHT  12/03/2017   LAPAROSCOPY     NECK SURGERY  50 yrs ago   Left side tumor   RADIOLOGY WITH ANESTHESIA N/A 05/25/2014   Procedure: RADIOLOGY WITH ANESTHESIA;  Surgeon: [name], MD;  Location: [place];  Service: Radiology;  Laterality: N/A;   RADIOLOGY WITH ANESTHESIA N/A 08/12/2014   Procedure: RADIOLOGY WITH ANESTHESIA;  Surgeon:  [name], MD;  Location: [place];  Service: Radiology;  Laterality: N/A;   RADIOLOGY WITH ANESTHESIA N/A 03/15/2015   Procedure: MRI OF BRAIN WITHOUT CONTRAST;  Surgeon: Medication Radiologist, MD;  Location: [place];  Service: Radiology;  Laterality: N/A;  DR. TAT/MRI   US ECHOCARDIOGRAPHY  04-26-2009   EF 65-70%   VESICOVAGINAL FISTULA CLOSURE W/ TAH  25 yrs ago   WOUND EXPLORATION Right 08/22/2017   Procedure: REMOVAL OF RIGHT COMMON CAROTID SHEATH AND WOUND CLOSURE;  Surgeon: Rosetta Posner, MD;  Location[facility];  Service: Vascular;  Laterality: Right;   Patient Active Problem List   Diagnosis Date Noted   Acute on chronic diastolic (congestive) heart failure ([place]) 06/30/2021   History of CVA (cerebrovascular accident) 06/28/2021   Chronic diastolic CHF (congestive heart failure) ([place]) 06/28/2021   Lymphedema    Acute on chronic diastolic CHF (congestive heart failure) ([place]) 04/16/2021   COPD (chronic obstructive pulmonary disease) ([place])    Acute encephalopathy 04/15/2021   Edema of both lower extremities due to peripheral venous insufficiency 07/13/2020   Acute cholecystitis 03/07/2016   Small vessel disease, cerebrovascular 05/31/2015   Aneurysm, cerebral, nonruptured 05/31/2015   Dizziness and giddiness 05/31/2015   Altered mental status    Acute CVA (cerebrovascular accident) ([place]) 03/15/2015   Dysphasia 03/12/2015   Thrombocytopenia ([place]) 03/12/2015   Hypokalemia    Endotracheally intubated    Essential hypertension    Intracranial aneurysm 11/18/2014   Respiratory failure ([facility])    Cerebral aneurysm    Brain aneurysm    Aphasia    Palmar erythema    CVA (cerebral infarction) 05/02/2014   Aneurysm ([place]) 05/02/2014   Expressive aphasia 05/02/2014   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   Tobacco abuse 10/24/2010   TIA (transient ischemic attack)    Hypertension    Ventricular hypertrophy    CVA 02/23/2010   STRESS FRACTURE, FOOT  02/22/2010    ONSET DATE: March 2023:  Referral date 10-28-21  REFERRING DIAG: R26.0 (ICD-10-CM) - Ataxic gait I67.1 (ICD-10-CM) - Cerebral aneurysm, nonruptured   THERAPY DIAG:  Other abnormalities of gait and mobility  Unsteadiness on feet  Muscle weakness (generalized)  Rationale for Evaluation and Treatment Rehabilitation  SUBJECTIVE:  SUBJECTIVE STATEMENT: Pt returns to PT after having been gone to beach for 2 weeks to visit her daughter; pt states she took the cane as well as her walker and did well with walking on various surfaces and terrains; was able to go and down about 20 steps to get into house - had daughter standing by for safety but did not need help  Pt accompanied by: self  PERTINENT HISTORY:  TRYSTEN BERTI is a 76 y.o. female with medical history significant of chronic diastolic CHF, CVA, chronic severe lymphedema, COPD and hypertension who presented with worsening swelling of the lower extremity.   PAIN:  Are you having pain? Yes: NPRS scale: 2-3/10 Pain location: low back and Rt knee Pain description: soreness Aggravating factors: weight bearing Relieving factors: non weight bearing positions Pt reports constant pain in low back and Rt knee;    Back pain varies  PRECAUTIONS: None  WEIGHT BEARING RESTRICTIONS No  FALLS: Has patient fallen in last 6 months? No  LIVING ENVIRONMENT: Lives with: lives alone and lives in an assisted living facility Lives in: Other assisted living facility Has following equipment at home: Single point cane, Environmental consultant - 2 wheeled, Electronics engineer, and Grab bars  PLOF: Independent with basic ADLs, Independent with household mobility with device, and Independent with transfers  PATIENT GOALS "to walk with my cane"  Today's Treatment:    12-20-21  Gait:  Pt gait trained 230' with SPC on flat, even surface (for STG assessment)  Gait velocity:  18.62 secs with RW = 1.76 ft/sec    TherEx: Heel raises 10 reps bil. LE's Unilateral heel raise each leg with bil. UE support on counter 10 reps  Step ups RLE and LLE 10 reps with UE support on hand rail - 6" step used  Step down exercise for eccentric quad strengthening - 4" step used inside // bars for UE support - 10 reps LLE and 5 reps RLE due to c/o knee pain with the concentric contraction in returning to the step   Sit to stand - 10 reps from mat without UE support  NeuroRe-ed: Tap ups to 1st step with CGA to min assist 5 reps each leg; 5 reps with each leg to 2nd step with min UE support on rails  TUG score 17.09 secs with RW  Pt performed rockerboard exercise anterior/posterior direction inside // bars with UE support 10 reps x 2 sets  Stepping over and back of balance beam 5 reps each leg with UE support on // bars  Pt performed sidestepping with squats 10' x 2 reps inside // bars with UE support  for balance   PATIENT EDUCATION: Education details: sit to stand for functional strengthening Person educated: Patient Education method: Explanation Education comprehension: verbalized understanding   HOME EXERCISE PROGRAM: To be established    GOALS: Goals reviewed with patient? Yes  SHORT TERM GOALS: Target date: 12/09/2021   PT Short Term Goals - 11/09/21 1947       PT SHORT TERM GOAL #1   Title Increase gait velocity to >/= 1.9 ft/sec with RW for incr. gait efficiency.    Time 4    Period Weeks    Status 18.62 = 1.79:  Partially met 12-20-21   Target Date 12/09/21      PT SHORT TERM GOAL #2   Title Pt will improve TUG score to </= 16 secs with RW to demo improved functional mobility.    Time 4    Period Weeks  Status  17.09 secs ; Partially met 12-20-21   Target Date 12/09/21      PT SHORT TERM GOAL #3   Title Pt will amb. 230' with SPC  with SBA for increased safety with household and community mobility.    Time 4    Period Weeks    Status Goal met 12-20-21   Target Date 12/09/21      PT SHORT TERM GOAL #4   Title Independent in HEP for balance, ROM and strengthening.    Time 4    Period Weeks    Status Met 12-20-21   Target Date 12/09/21              LONG TERM GOALS: Target date: 01/06/2022   PT Long Term Goals - 11/09/21 1949       PT LONG TERM GOAL #1   Title Independent in updated HEP for bil. strengthening and balance exercises.    Time 8    Period Weeks    Status New    Target Date 01/06/22      PT LONG TERM GOAL #2   Title Improve 5x sit to stand score from 13.22 secs to </= 10 secs without UE support from mat.    Baseline 13.22 from mat    Time 8    Period Weeks    Status New    Target Date 01/06/22      PT LONG TERM GOAL #3   Title Improve TUG score to </= 17 secs with SPC to demo improved mobility.    Baseline 20.37 secs with RW    Time 8    Period Weeks    Status New      PT LONG TERM GOAL #4   Title Incr. gait velocity to >/= 2.0  ft/sec with SPC for incr. gait efficiency.    Baseline 1.55 ft/sec with RW    Time 8    Period Weeks    Status New    Target Date 01/06/22           ASSESSMENT:  CLINICAL IMPRESSION: PT session focused assessment of STG's as pt has not had PT in previous 2 weeks due to being out of town.  Pt has partially met STG #1 as gait velocity has increased from 1.55 ft/sec to 1.76 ft/sec with RW but not increased to stated goal of >/= 1.9 ft/sec;  STG #2 partially met as TUG score has improved from 20.37 secs to 17.09 secs but not to stated goal of <16 secs.  Pt has met STG's #3 and 4.  Cont with POC.     OBJECTIVE IMPAIRMENTS decreased balance, decreased endurance, difficulty walking, decreased ROM, decreased strength, increased edema, and pain.   ACTIVITY LIMITATIONS carrying, lifting, bending, standing, squatting, stairs, transfers, and locomotion  level  PARTICIPATION LIMITATIONS: meal prep, cleaning, laundry, driving, and shopping  PERSONAL FACTORS Past/current experiences, Time since onset of injury/illness/exacerbation, and 1 comorbidity: lymphedema  are also affecting patient's functional outcome.   REHAB POTENTIAL:  Good  CLINICAL DECISION MAKING: Stable/uncomplicated  EVALUATION COMPLEXITY: Low  PLAN: PT FREQUENCY: 1x/week  PT DURATION: 8 weeks  PLANNED INTERVENTIONS: Therapeutic exercises, Therapeutic activity, Neuromuscular re-education, Balance training, Gait training, Patient/Family education, Self Care, Stair training, and DME instructions  PLAN FOR NEXT SESSION:  Cont with LE strengthening, balance and gait training   Sophie Quiles, [place], PT 12/21/2021, 1:15 PM

## 2021-12-21 ENCOUNTER — Encounter: Payer: Self-pay | Admitting: Physical Therapy

## 2021-12-27 ENCOUNTER — Ambulatory Visit: Payer: Medicare Other | Attending: Internal Medicine | Admitting: Physical Therapy

## 2021-12-27 DIAGNOSIS — R2689 Other abnormalities of gait and mobility: Secondary | ICD-10-CM | POA: Diagnosis present

## 2021-12-27 DIAGNOSIS — R2681 Unsteadiness on feet: Secondary | ICD-10-CM | POA: Insufficient documentation

## 2021-12-27 DIAGNOSIS — M6281 Muscle weakness (generalized): Secondary | ICD-10-CM | POA: Diagnosis present

## 2021-12-27 NOTE — Therapy (Unsigned)
OUTPATIENT PHYSICAL THERAPY NEURO TREATMENT NOTE   Patient Name: Jeanne Lawson MRN: [id number] DOB:11/06/1945, 76 y.o., female Today's Date: 12/28/2021   PCP: [name], MD  REFERRING PROVIDER: [name], MD    PT End of Session - 12/28/21 1125     Visit Number 6    Number of Visits 9    Authorization Type [employer] Medicare    Authorization Time Period 11-08-21 - 01-22-22    PT Start Time 0934    PT Stop Time 1018    PT Time Calculation (min) 44 min    Equipment Utilized During Treatment Gait belt    Activity Tolerance Patient tolerated treatment well    Behavior During Therapy [facility] for tasks assessed/performed               Past Medical History:  Diagnosis Date   Acute on chronic diastolic CHF (congestive heart failure) ([place]) 04/16/2021   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    COPD (chronic obstructive pulmonary disease) ([place])    Coronary artery disease 1996   Known with prior mild lesion of LAD demonstrated by Cardiac Catheterization in 1996   Edema of foot    She has a history of chronic edema of the left dated back to age 76 when she suufered severe frostbite playing  on the snow as a child   Hypertension    Lower extremity edema    PAC (premature atrial contraction)    Pancreatitis    x2   PONV (postoperative nausea and vomiting)    problems waking up last time 4/16 only time   Saccular aneurysm    She also has 2 known which were stable between the MRA of October2010 and the MRA  of April 2011.   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 Has normal EF.   Past Surgical History:  Procedure Laterality Date   ABDOMINAL HYSTERECTOMY      APPENDECTOMY     CARDIAC CATHETERIZATION  1996   Mild CAD with vasospasm   CARDIOVASCULAR STRESS TEST  12-02-2001   EF 70%   CHOLECYSTECTOMY N/A 03/09/2016   Procedure: LAPAROSCOPIC CHOLECYSTECTOMY WITH INTRAOPERATIVE CHOLANGIOGRAM;  Surgeon: Georganna Skeans, MD;  Location: [place];  Service: General;  Laterality: N/A;   ENDARTERECTOMY Right 08/12/2014   Procedure: RIGHT  COMMON CAROTID ARTERY EXPOSURE FOR INTERVENTIONAL RADIOLOGY PROCEDURE BY DR.DEVASHWAR,Insertion 6 FR sheath;  Surgeon: Elam Dutch, MD;  Location: [place];  Service: Vascular;  Laterality: Right;   ENDARTERECTOMY Right 08/12/2014   Procedure:  CAROTID  EXPOSURE CLOSURE RIGHT NECK, REPAIR RIGHT COMMON CAROTID ARTERY;  Surgeon: Elam Dutch, MD;  Location: [place];  Service: Vascular;  Laterality: Right;   ENDARTERECTOMY Left 11/18/2014   Procedure: CAROTID EXPOSURE;  Surgeon: Elam Dutch, MD;  Location: [place];  Service: Vascular;  Laterality: Left;   ENDARTERECTOMY Left 11/18/2014   Procedure: CLOSURE CAROTID;  Surgeon: Elam Dutch, MD;  Location: [place];  Service: Vascular;  Laterality: Left;   ENDARTERECTOMY Right 08/22/2017   Procedure: EXPOSURE CAROTID ARTERY FOR [place];  Surgeon: Elam Dutch, MD;  Location: [facility]  OR;  Service: Vascular;  Laterality: Right;   IR ANGIO INTRA EXTRACRAN SEL INTERNAL CAROTID UNI R MOD SED  08/22/2017   IR ANGIOGRAM FOLLOW UP STUDY  08/22/2017   IR RADIOLOGIST EVAL & MGMT  06/26/2016   IR RADIOLOGIST EVAL & MGMT  05/08/2017   IR RADIOLOGY PERIPHERAL GUIDED IV START  12/03/2017   IR TRANSCATH/EMBOLIZ  08/22/2017   IR US GUIDE VASC ACCESS RIGHT  12/03/2017   LAPAROSCOPY     NECK SURGERY  50 yrs ago   Left side tumor   RADIOLOGY WITH ANESTHESIA N/A 05/25/2014   Procedure: RADIOLOGY WITH ANESTHESIA;  Surgeon: [name], MD;  Location: [place];  Service: Radiology;  Laterality: N/A;   RADIOLOGY WITH ANESTHESIA N/A 08/12/2014   Procedure: RADIOLOGY WITH ANESTHESIA;  Surgeon:  [name], MD;  Location: [place];  Service: Radiology;  Laterality: N/A;   RADIOLOGY WITH ANESTHESIA N/A 03/15/2015   Procedure: MRI OF BRAIN WITHOUT CONTRAST;  Surgeon: Medication Radiologist, MD;  Location: [place];  Service: Radiology;  Laterality: N/A;  DR. TAT/MRI   US ECHOCARDIOGRAPHY  04-26-2009   EF 65-70%   VESICOVAGINAL FISTULA CLOSURE W/ TAH  25 yrs ago   WOUND EXPLORATION Right 08/22/2017   Procedure: REMOVAL OF RIGHT COMMON CAROTID SHEATH AND WOUND CLOSURE;  Surgeon: Rosetta Posner, MD;  Location: [facility] OR;  Service: Vascular;  Laterality: Right;   Patient Active Problem List   Diagnosis Date Noted   Acute on chronic diastolic (congestive) heart failure ([place]) 06/30/2021   History of CVA (cerebrovascular accident) 06/28/2021   Chronic diastolic CHF (congestive heart failure) ([place]) 06/28/2021   Lymphedema    Acute on chronic diastolic CHF (congestive heart failure) ([place]) 04/16/2021   COPD (chronic obstructive pulmonary disease) ([place])    Acute encephalopathy 04/15/2021   Edema of both lower extremities due to peripheral venous insufficiency 07/13/2020   Acute cholecystitis 03/07/2016   Small vessel disease, cerebrovascular 05/31/2015   Aneurysm, cerebral, nonruptured 05/31/2015   Dizziness and giddiness 05/31/2015   Altered mental status    Acute CVA (cerebrovascular accident) ([place]) 03/15/2015   Dysphasia 03/12/2015   Thrombocytopenia ([place]) 03/12/2015   Hypokalemia    Endotracheally intubated    Essential hypertension    Intracranial aneurysm 11/18/2014   Respiratory failure ([facility])    Cerebral aneurysm    Brain aneurysm    Aphasia    Palmar erythema    CVA (cerebral infarction) 05/02/2014   Aneurysm ([place]) 05/02/2014   Expressive aphasia 05/02/2014   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   Tobacco abuse 10/24/2010   TIA (transient ischemic attack)    Hypertension    Ventricular hypertrophy    CVA 02/23/2010   STRESS FRACTURE, FOOT  02/22/2010    ONSET DATE: March 2023:  Referral date 10-28-21  REFERRING DIAG: R26.0 (ICD-10-CM) - Ataxic gait I67.1 (ICD-10-CM) - Cerebral aneurysm, nonruptured   THERAPY DIAG:  Other abnormalities of gait and mobility  Unsteadiness on feet  Muscle weakness (generalized)  Rationale for Evaluation and Treatment Rehabilitation  SUBJECTIVE:  SUBJECTIVE STATEMENT: Pt wearing leggings (pants)  for first time in approx. 2 years - pt states she continues to lose fluid in her legs Pt accompanied by: self  PERTINENT HISTORY:  Jeanne Lawson is a 76 y.o. female with medical history significant of chronic diastolic CHF, CVA, chronic severe lymphedema, COPD and hypertension who presented with worsening swelling of the lower extremity.   PAIN:  No pain at this current time 12-27-21 - states she usually has some pain by the end of the session after standing/walking - see below  Are you having pain? Yes: NPRS scale: 2-3/10 Pain location: low back and Rt knee Pain description: soreness Aggravating factors: weight bearing Relieving factors: non weight bearing positions Pt reports constant pain in low back and Rt knee;    Back pain varies  PRECAUTIONS: None  WEIGHT BEARING RESTRICTIONS No  FALLS: Has patient fallen in last 6 months? No  LIVING ENVIRONMENT: Lives with: lives alone and lives in an assisted living facility Lives in: Other assisted living facility Has following equipment at home: Single point cane, Environmental consultant - 2 wheeled, Electronics engineer, and Grab bars  PLOF: Independent with basic ADLs, Independent with household mobility with device, and Independent with transfers  PATIENT GOALS "to walk with my cane"  Today's Treatment:   12-27-21  Gait:  Pt gait trained 115' with SPC on flat, even surface  with CGA to SBA  Gait trained 30' from steps to counter without device with CGAu    TherEx: Heel raises 10 reps bil. LE's Unilateral heel raise each leg with bil. UE support on counter 10 reps  Step ups RLE and LLE 10 reps with UE support on hand rail - 6" step used  Step down exercise for eccentric quad strengthening - 4" step used inside // bars for UE support - 10 reps LLE and 5 reps RLE due to c/o knee pain with the concentric contraction in returning to the step   Sit to stand - 10 reps from mat without UE support  Attempted leg press but pt stated the seat was too much pressure on her coccyx area so exercise was not performed due to unable to tolerate the seated position  SciFit level 3.0 x 5" with bil. UE's and LE's   NeuroRe-ed: Tap ups to 1st step with CGA to min assist 5 reps each leg; 5 reps with each leg to 2nd step with min UE support on rails  Balance  bubbles - 3 used - pt tapped each one 3 reps each foot with minimal LUE support on counter with CGA   Pt performed rockerboard exercise anterior/posterior direction inside // bars with UE support 10 reps x 2 sets  Stepping over and back of balance beam 5 reps each leg with UE support on // bars  Pt performed sidestepping with squats 10' x 2 reps inside // bars with UE support  for balance   PATIENT EDUCATION: Education details: sit to stand for functional strengthening Person educated: Patient Education method: Explanation Education comprehension: verbalized understanding   HOME EXERCISE PROGRAM: To be established    GOALS: Goals reviewed with patient? Yes  SHORT TERM GOALS: Target date: 12/09/2021   PT Short Term Goals - 11/09/21 1947       PT SHORT TERM GOAL #1   Title Increase gait velocity to >/= 1.9 ft/sec with RW for incr. gait efficiency.    Time 4    Period Weeks    Status 18.62 = 1.79:  Partially met 12-20-21   Target Date 12/09/21      PT SHORT TERM GOAL #2   Title Pt will improve TUG  score to </= 16 secs with RW to demo improved functional mobility.    Time 4    Period Weeks    Status  17.09 secs ; Partially met 12-20-21   Target Date 12/09/21      PT SHORT TERM GOAL #3   Title Pt will amb. 27' with SPC with SBA for increased safety with household and community mobility.    Time 4    Period Weeks    Status Goal met 12-20-21   Target Date 12/09/21      PT SHORT TERM GOAL #4   Title Independent in HEP for balance, ROM and strengthening.    Time 4    Period Weeks    Status Met 12-20-21   Target Date 12/09/21              LONG TERM GOALS: Target date: 01/06/2022   PT Long Term Goals - 11/09/21 1949       PT LONG TERM GOAL #1   Title Independent in updated HEP for bil. strengthening and balance exercises.    Time 8    Period Weeks    Status New    Target Date 01/06/22      PT LONG TERM GOAL #2   Title Improve 5x sit to stand score from 13.22 secs to </= 10 secs without UE support from mat.    Baseline 13.22 from mat    Time 8    Period Weeks    Status New    Target Date 01/06/22      PT LONG TERM GOAL #3   Title Improve TUG score to </= 17 secs with SPC to demo improved mobility.    Baseline 20.37 secs with RW    Time 8    Period Weeks    Status New      PT LONG TERM GOAL #4   Title Incr. gait velocity to >/= 2.0  ft/sec with SPC for incr. gait efficiency.    Baseline 1.55 ft/sec with RW    Time 8    Period Weeks    Status New    Target Date 01/06/22           ASSESSMENT:  CLINICAL IMPRESSION: PT session focused on LE strengthening, balance training with progressive decrease in UE support and also on gait training with SPC and without device for short distance.  Pt demonstrates improved flexibility as lymphedema continues to decrease.  LLE remains weaker than RLE due to residual deficit of CVA.  Pt is progressing well towards goals. Cont with POC.     OBJECTIVE IMPAIRMENTS decreased balance, decreased endurance, difficulty  walking, decreased ROM, decreased strength, increased edema, and pain.   ACTIVITY LIMITATIONS carrying, lifting, bending, standing, squatting, stairs, transfers, and locomotion level  PARTICIPATION LIMITATIONS: meal prep, cleaning, laundry, driving, and shopping  PERSONAL FACTORS Past/current experiences, Time since onset of injury/illness/exacerbation, and 1 comorbidity: lymphedema  are also affecting patient's functional outcome.   REHAB POTENTIAL:  Good  CLINICAL DECISION MAKING: Stable/uncomplicated  EVALUATION COMPLEXITY: Low  PLAN: PT FREQUENCY: 1x/week  PT DURATION: 8 weeks  PLANNED INTERVENTIONS: Therapeutic exercises, Therapeutic activity, Neuromuscular re-education, Balance training, Gait training, Patient/Family education, Self Care, Stair training, and DME instructions  PLAN FOR NEXT SESSION:  Cont with LE strengthening, balance and gait training   Demita Tobia, [place], PT  12/28/2021, 11:29 AM

## 2021-12-28 ENCOUNTER — Encounter: Payer: Self-pay | Admitting: Physical Therapy

## 2022-01-03 ENCOUNTER — Ambulatory Visit: Payer: Medicare Other | Admitting: Physical Therapy

## 2022-01-03 DIAGNOSIS — R2681 Unsteadiness on feet: Secondary | ICD-10-CM

## 2022-01-03 DIAGNOSIS — R2689 Other abnormalities of gait and mobility: Secondary | ICD-10-CM

## 2022-01-03 DIAGNOSIS — M6281 Muscle weakness (generalized): Secondary | ICD-10-CM

## 2022-01-03 NOTE — Therapy (Signed)
OUTPATIENT PHYSICAL THERAPY NEURO TREATMENT NOTE   Patient Name: Jeanne Lawson MRN: [id number] DOB:[birth year], 76 y.o., female Today's Date: 01/04/2022   PCP: [name], MD  REFERRING PROVIDER: [name], MD    PT End of Session - 01/04/22 1813     Visit Number 7    Number of Visits 9    Authorization Type [employer] Medicare    Authorization Time Period 11-08-21 - 01-22-22    PT Start Time 0933    PT Stop Time 1015    PT Time Calculation (min) 42 min    Equipment Utilized During Treatment Gait belt    Activity Tolerance Patient tolerated treatment well    Behavior During Therapy [facility] for tasks assessed/performed                Past Medical History:  Diagnosis Date   Acute on chronic diastolic CHF (congestive heart failure) ([place]) 04/16/2021   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    COPD (chronic obstructive pulmonary disease) ([place])    Coronary artery disease 1996   Known with prior mild lesion of LAD demonstrated by Cardiac Catheterization in 1996   Edema of foot    She has a history of chronic edema of the left dated back to age 7 when she suufered severe frostbite playing  on the snow as a child   Hypertension    Lower extremity edema    PAC (premature atrial contraction)    Pancreatitis    x2   PONV (postoperative nausea and vomiting)    problems waking up last time 4/16 only time   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 Has normal EF.   Past Surgical History:  Procedure Laterality Date   ABDOMINAL HYSTERECTOMY      APPENDECTOMY     CARDIAC CATHETERIZATION  1996   Mild CAD with vasospasm   CARDIOVASCULAR STRESS TEST  12-02-2001   EF 70%   CHOLECYSTECTOMY N/A 03/09/2016   Procedure: LAPAROSCOPIC CHOLECYSTECTOMY WITH INTRAOPERATIVE CHOLANGIOGRAM;  Surgeon: Georganna Skeans, MD;  Location: [place];  Service: General;  Laterality: N/A;   ENDARTERECTOMY Right 08/12/2014   Procedure: RIGHT  COMMON CAROTID ARTERY EXPOSURE FOR INTERVENTIONAL RADIOLOGY PROCEDURE BY DR.DEVASHWAR,Insertion 6 FR sheath;  Surgeon: Elam Dutch, MD;  Location: [place];  Service: Vascular;  Laterality: Right;   ENDARTERECTOMY Right 08/12/2014   Procedure:  CAROTID  EXPOSURE CLOSURE RIGHT NECK, REPAIR RIGHT COMMON CAROTID ARTERY;  Surgeon: Elam Dutch, MD;  Location: [place];  Service: Vascular;  Laterality: Right;   ENDARTERECTOMY Left 11/18/2014   Procedure: CAROTID EXPOSURE;  Surgeon: Elam Dutch, MD;  Location: [place];  Service: Vascular;  Laterality: Left;   ENDARTERECTOMY Left 11/18/2014   Procedure: CLOSURE CAROTID;  Surgeon: Elam Dutch, MD;  Location: [place];  Service: Vascular;  Laterality: Left;   ENDARTERECTOMY Right 08/22/2017   Procedure: EXPOSURE CAROTID ARTERY FOR [place];  Surgeon: Elam Dutch, MD;  [facility];  Service: Vascular;  Laterality: Right;   IR ANGIO INTRA EXTRACRAN SEL INTERNAL CAROTID UNI R MOD SED  08/22/2017   IR ANGIOGRAM FOLLOW UP STUDY  08/22/2017   IR RADIOLOGIST EVAL & MGMT  06/26/2016   IR RADIOLOGIST EVAL & MGMT  05/08/2017   IR RADIOLOGY PERIPHERAL GUIDED IV START  12/03/2017   IR TRANSCATH/EMBOLIZ  08/22/2017   IR US GUIDE VASC ACCESS RIGHT  12/03/2017   LAPAROSCOPY     NECK SURGERY  50 yrs ago   Left side tumor   RADIOLOGY WITH ANESTHESIA N/A 05/25/2014   Procedure: RADIOLOGY WITH ANESTHESIA;  Surgeon: [name], MD;  Location: [place];  Service: Radiology;  Laterality: N/A;   RADIOLOGY WITH ANESTHESIA N/A 08/12/2014   Procedure: RADIOLOGY WITH ANESTHESIA;   Surgeon: [name], MD;  Location: [place];  Service: Radiology;  Laterality: N/A;   RADIOLOGY WITH ANESTHESIA N/A 03/15/2015   Procedure: MRI OF BRAIN WITHOUT CONTRAST;  Surgeon: Medication Radiologist, MD;  Location: [place];  Service: Radiology;  Laterality: N/A;  DR. TAT/MRI   US ECHOCARDIOGRAPHY  04-26-2009   EF 65-70%   VESICOVAGINAL FISTULA CLOSURE W/ TAH  25 yrs ago   WOUND EXPLORATION Right 08/22/2017   Procedure: REMOVAL OF RIGHT COMMON CAROTID SHEATH AND WOUND CLOSURE;  Surgeon: Rosetta Posner, MD;  [facility];  Service: Vascular;  Laterality: Right;   Patient Active Problem List   Diagnosis Date Noted   Acute on chronic diastolic (congestive) heart failure ([place]) 06/30/2021   History of CVA (cerebrovascular accident) 06/28/2021   Chronic diastolic CHF (congestive heart failure) ([place]) 06/28/2021   Lymphedema    Acute on chronic diastolic CHF (congestive heart failure) ([place]) 04/16/2021   COPD (chronic obstructive pulmonary disease) ([place])    Acute encephalopathy 04/15/2021   Edema of both lower extremities due to peripheral venous insufficiency 07/13/2020   Acute cholecystitis 03/07/2016   Small vessel disease, cerebrovascular 05/31/2015   Aneurysm, cerebral, nonruptured 05/31/2015   Dizziness and giddiness 05/31/2015   Altered mental status    Acute CVA (cerebrovascular accident) ([place]) 03/15/2015   Dysphasia 03/12/2015   Thrombocytopenia ([place]) 03/12/2015   Hypokalemia    Endotracheally intubated    Essential hypertension    Intracranial aneurysm 11/18/2014   Respiratory failure ([facility])    Cerebral aneurysm    Brain aneurysm    Aphasia    Palmar erythema    CVA (cerebral infarction) 05/02/2014   Aneurysm ([place]) 05/02/2014   Expressive aphasia 05/02/2014   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   Tobacco abuse 10/24/2010   TIA (transient ischemic attack)    Hypertension    Ventricular hypertrophy    CVA 02/23/2010   STRESS  FRACTURE, FOOT 02/22/2010    ONSET DATE: March 2023:  Referral date 10-28-21  REFERRING DIAG: R26.0 (ICD-10-CM) - Ataxic gait I67.1 (ICD-10-CM) - Cerebral aneurysm, nonruptured   THERAPY DIAG:  Other abnormalities of gait and mobility  Unsteadiness on feet  Muscle weakness (generalized)  Rationale for Evaluation and Treatment Rehabilitation  SUBJECTIVE:  SUBJECTIVE STATEMENT: Pt reports she is doing well - reports no falls - continues to walk with SPC some at home Pt accompanied by: self  PERTINENT HISTORY:  YEVONNE YOKUM is a 76 y.o. female with medical history significant of chronic diastolic CHF, CVA, chronic severe lymphedema, COPD and hypertension who presented with worsening swelling of the lower extremity.   PAIN:  No pain at this current time 12-27-21 - states she usually has some pain by the end of the session after standing/walking - see below  Are you having pain? Yes: NPRS scale: 2-3/10 Pain location: low back and Rt knee Pain description: soreness Aggravating factors: weight bearing Relieving factors: non weight bearing positions Pt reports constant pain in low back and Rt knee;    Back pain varies   PRECAUTIONS: None  WEIGHT BEARING RESTRICTIONS No  FALLS: Has patient fallen in last 6 months? No  LIVING ENVIRONMENT: Lives with: lives alone and lives in an assisted living facility Lives in: Other assisted living facility Has following equipment at home: Single point cane, Environmental consultant - 2 wheeled, Electronics engineer, and Grab bars  PLOF: Independent with basic ADLs, Independent with household mobility with device, and Independent with transfers  PATIENT GOALS "to walk with my cane"  Today's Treatment:   01-03-22  Gait:  Pt gait trained 115' with SPC on flat, even surface with  CGA to SBA  Gait trained 50' without device with CGA  Gait velocity 22.03 secs = 1.49 ft/sec with RW   TherEx: Heel raises 10 reps bil. LE's  Unilateral heel raise each leg with bil. UE support on counter 10 reps  Step ups RLE and LLE 10 reps with UE support on hand rail - 6" step used  Step down exercise for eccentric quad strengthening - 4" step used inside // bars for UE support - 10 reps LLE and 5 reps RLE due to c/o knee pain with the concentric contraction in returning to the step   Sit to stand  - 5x - 12.56 secs without UE support  Standing hip flexion, extension and abduction with 3# weight 10 reps each direction each leg  NeuroRe-ed: Tap ups to 1st step with CGA to min assist 5 reps each leg; 5 reps with each leg to 2nd step with min UE support on rails   Pt performed rockerboard exercise anterior/posterior direction inside // bars with UE support 10 reps x 2 sets  Stepping over and back of balance beam 5 reps each leg with UE support on // bars  Pt performed sidestepping with squats 10' x 2 reps inside // bars with UE support  for balance  TUG score 18.81 secs with [facility]  PATIENT EDUCATION: Education details: sit to stand for functional strengthening Person educated: Patient Education method: Explanation Education comprehension: verbalized understanding   HOME EXERCISE PROGRAM: To be established    GOALS: Goals reviewed with patient? Yes  SHORT TERM GOALS: Target date: 12/09/2021   PT Short Term Goals - 11/09/21 1947       PT SHORT TERM GOAL #1   Title Increase gait velocity to >/= 1.9 ft/sec with RW for incr. gait efficiency.    Time 4    Period Weeks    Status 18.62 = 1.79:  Partially met 12-20-21   Target Date 12/09/21      PT SHORT TERM GOAL #2   Title Pt will improve TUG score to </= 16 secs with RW to demo improved functional mobility.  Time 4    Period Weeks    Status  17.09 secs ; Partially met 12-20-21   Target Date 12/09/21      PT  SHORT TERM GOAL #3   Title Pt will amb. 54' with SPC with SBA for increased safety with household and community mobility.    Time 4    Period Weeks    Status Goal met 12-20-21   Target Date 12/09/21      PT SHORT TERM GOAL #4   Title Independent in HEP for balance, ROM and strengthening.    Time 4    Period Weeks    Status Met 12-20-21   Target Date 12/09/21              LONG TERM GOALS: Target date: 01/06/2022   PT Long Term Goals - 11/09/21 1949       PT LONG TERM GOAL #1   Title Independent in updated HEP for bil. strengthening and balance exercises.    Time 8    Period Weeks    Status Goal met 01-03-22   Target Date 01/06/22      PT LONG TERM GOAL #2   Title Improve 5x sit to stand score from 13.22 secs to </= 10 secs without UE support from mat.    Baseline 13.22 from mat; 12.56 secs    Time 8    Period Weeks    Status Goal met   Target Date 01/06/22      PT LONG TERM GOAL #3   Title Improve TUG score to </= 17 secs with SPC to demo improved mobility.    Baseline 20.37 secs with RW ; 18.81 secs with SPC   Time 8    Period Weeks    Status Goal not met 01-03-22     PT LONG TERM GOAL #4   Title Incr. gait velocity to >/= 2.0  ft/sec with SPC for incr. gait efficiency.    Baseline 1.55 ft/sec with RW ; 22.03 secs = 1.49 ft/sec   Time 8    Period Weeks    Status Goal not met 01-03-22   Target Date 01/06/22           ASSESSMENT:  CLINICAL IMPRESSION: PT session on assessment of LTG's:  pt has met LTG's #1 & 2:  LTG #3 not met as TUG score = 18.81 secs with SPC and LTG #4 not met as gait velocity = 1.49 ft/sec with SPC.   Pt has made good progress with improving mobility but continues to be limited by lymphedema in bil. LE's.  Cont with POC.     OBJECTIVE IMPAIRMENTS decreased balance, decreased endurance, difficulty walking, decreased ROM, decreased strength, increased edema, and pain.   ACTIVITY LIMITATIONS carrying, lifting, bending, standing,  squatting, stairs, transfers, and locomotion level  PARTICIPATION LIMITATIONS: meal prep, cleaning, laundry, driving, and shopping  PERSONAL FACTORS Past/current experiences, Time since onset of injury/illness/exacerbation, and 1 comorbidity: lymphedema  are also affecting patient's functional outcome.   REHAB POTENTIAL:  Good  CLINICAL DECISION MAKING: Stable/uncomplicated  EVALUATION COMPLEXITY: Low  PLAN: PT FREQUENCY: 1x/week  PT DURATION: 8 weeks  PLANNED INTERVENTIONS: Therapeutic exercises, Therapeutic activity, Neuromuscular re-education, Balance training, Gait training, Patient/Family education, Self Care, Stair training, and DME instructions  PLAN FOR NEXT SESSION:  Cont with LE strengthening, balance and gait training   Jouri Threat, [place], PT 01/04/2022, 6:21 PM

## 2022-01-04 ENCOUNTER — Encounter: Payer: Self-pay | Admitting: Physical Therapy

## 2022-01-10 ENCOUNTER — Encounter: Payer: Self-pay | Admitting: Physical Therapy

## 2022-01-10 ENCOUNTER — Ambulatory Visit: Payer: Medicare Other | Admitting: Physical Therapy

## 2022-01-10 DIAGNOSIS — R2681 Unsteadiness on feet: Secondary | ICD-10-CM

## 2022-01-10 DIAGNOSIS — M6281 Muscle weakness (generalized): Secondary | ICD-10-CM

## 2022-01-10 DIAGNOSIS — R2689 Other abnormalities of gait and mobility: Secondary | ICD-10-CM

## 2022-01-10 NOTE — Therapy (Signed)
OUTPATIENT PHYSICAL THERAPY NEURO TREATMENT NOTE/RE-CERT   Patient Name: Jeanne Lawson MRN: [id number] DOB:[birth year], 76 y.o., female Today's Date: 01/10/2022   PCP: [name], MD  REFERRING PROVIDER: [name], MD    PT End of Session - 01/10/22 1941     Visit Number 8    Number of Visits 16    Date for PT Re-Evaluation 03/10/22    Authorization Type [employer] Medicare    Authorization Time Period 11-08-21 - 01-22-22;  01-10-22 - 03-23-22    PT Start Time 0935    PT Stop Time 1018    PT Time Calculation (min) 43 min    Equipment Utilized During Treatment Gait belt    Activity Tolerance Patient tolerated treatment well    Behavior During Therapy [facility] for tasks assessed/performed                 Past Medical History:  Diagnosis Date   Acute on chronic diastolic CHF (congestive heart failure) ([place]) 04/16/2021   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    COPD (chronic obstructive pulmonary disease) ([place])    Coronary artery disease 1996   Known with prior mild lesion of LAD demonstrated by Cardiac Catheterization in 1996   Edema of foot    She has a history of chronic edema of the left dated back to age 54 when she suufered severe frostbite playing  on the snow as a child   Hypertension    Lower extremity edema    PAC (premature atrial contraction)    Pancreatitis    x2   PONV (postoperative nausea and vomiting)    problems waking up last time 4/16 only time   Saccular aneurysm    She also has 2 known which were stable between the MRA of October2010 and the MRA  of April 2011.   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 Has normal EF.   Past  Surgical History:  Procedure Laterality Date   ABDOMINAL HYSTERECTOMY     APPENDECTOMY     CARDIAC CATHETERIZATION  1996   Mild CAD with vasospasm   CARDIOVASCULAR STRESS TEST  12-02-2001   EF 70%   CHOLECYSTECTOMY N/A 03/09/2016   Procedure: LAPAROSCOPIC CHOLECYSTECTOMY WITH INTRAOPERATIVE CHOLANGIOGRAM;  Surgeon: Georganna Skeans, MD;  Location: [place];  Service: General;  Laterality: N/A;   ENDARTERECTOMY Right 08/12/2014   Procedure: RIGHT  COMMON CAROTID ARTERY EXPOSURE FOR INTERVENTIONAL RADIOLOGY PROCEDURE BY DR.DEVASHWAR,Insertion 6 FR sheath;  Surgeon: Elam Dutch, MD;  Location: [place];  Service: Vascular;  Laterality: Right;   ENDARTERECTOMY Right 08/12/2014   Procedure:  CAROTID  EXPOSURE CLOSURE RIGHT NECK, REPAIR RIGHT COMMON CAROTID ARTERY;  Surgeon: Elam Dutch, MD;  Location: [place];  Service: Vascular;  Laterality: Right;   ENDARTERECTOMY Left 11/18/2014   Procedure: CAROTID EXPOSURE;  Surgeon: Elam Dutch, MD;  Location: [place];  Service: Vascular;  Laterality: Left;   ENDARTERECTOMY Left 11/18/2014   Procedure: CLOSURE CAROTID;  Surgeon: Elam Dutch, MD;  Location: [place];  Service: Vascular;  Laterality: Left;   ENDARTERECTOMY Right 08/22/2017   Procedure: EXPOSURE  CAROTID ARTERY FOR [place];  Surgeon: Elam Dutch, MD;  Location: [facility] OR;  Service: Vascular;  Laterality: Right;   IR ANGIO INTRA EXTRACRAN SEL INTERNAL CAROTID UNI R MOD SED  08/22/2017   IR ANGIOGRAM FOLLOW UP STUDY  08/22/2017   IR RADIOLOGIST EVAL & MGMT  06/26/2016   IR RADIOLOGIST EVAL & MGMT  05/08/2017   IR RADIOLOGY PERIPHERAL GUIDED IV START  12/03/2017   IR TRANSCATH/EMBOLIZ  08/22/2017   IR US GUIDE VASC ACCESS RIGHT  12/03/2017   LAPAROSCOPY     NECK SURGERY  50 yrs ago   Left side tumor   RADIOLOGY WITH ANESTHESIA N/A 05/25/2014   Procedure: RADIOLOGY WITH ANESTHESIA;  Surgeon: [name], MD;  Location: [place];  Service: Radiology;  Laterality: N/A;   RADIOLOGY WITH  ANESTHESIA N/A 08/12/2014   Procedure: RADIOLOGY WITH ANESTHESIA;  Surgeon: [name], MD;  Location: [place];  Service: Radiology;  Laterality: N/A;   RADIOLOGY WITH ANESTHESIA N/A 03/15/2015   Procedure: MRI OF BRAIN WITHOUT CONTRAST;  Surgeon: Medication Radiologist, MD;  Location: [place];  Service: Radiology;  Laterality: N/A;  DR. TAT/MRI   US ECHOCARDIOGRAPHY  04-26-2009   EF 65-70%   VESICOVAGINAL FISTULA CLOSURE W/ TAH  25 yrs ago   WOUND EXPLORATION Right 08/22/2017   Procedure: REMOVAL OF RIGHT COMMON CAROTID SHEATH AND WOUND CLOSURE;  Surgeon: Rosetta Posner, MD;  Location: [facility] OR;  Service: Vascular;  Laterality: Right;   Patient Active Problem List   Diagnosis Date Noted   Acute on chronic diastolic (congestive) heart failure ([place]) 06/30/2021   History of CVA (cerebrovascular accident) 06/28/2021   Chronic diastolic CHF (congestive heart failure) ([place]) 06/28/2021   Lymphedema    Acute on chronic diastolic CHF (congestive heart failure) ([place]) 04/16/2021   COPD (chronic obstructive pulmonary disease) ([place])    Acute encephalopathy 04/15/2021   Edema of both lower extremities due to peripheral venous insufficiency 07/13/2020   Acute cholecystitis 03/07/2016   Small vessel disease, cerebrovascular 05/31/2015   Aneurysm, cerebral, nonruptured 05/31/2015   Dizziness and giddiness 05/31/2015   Altered mental status    Acute CVA (cerebrovascular accident) ([place]) 03/15/2015   Dysphasia 03/12/2015   Thrombocytopenia ([place]) 03/12/2015   Hypokalemia    Endotracheally intubated    Essential hypertension    Intracranial aneurysm 11/18/2014   Respiratory failure ([facility])    Cerebral aneurysm    Brain aneurysm    Aphasia    Palmar erythema    CVA (cerebral infarction) 05/02/2014   Aneurysm ([place]) 05/02/2014   Expressive aphasia 05/02/2014   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   Tobacco abuse 10/24/2010   TIA (transient ischemic attack)     Hypertension    Ventricular hypertrophy    CVA 02/23/2010   STRESS FRACTURE, FOOT 02/22/2010    ONSET DATE: March 2023:  Referral date 10-28-21  REFERRING DIAG: R26.0 (ICD-10-CM) - Ataxic gait I67.1 (ICD-10-CM) - Cerebral aneurysm, nonruptured   THERAPY DIAG:  Other abnormalities of gait and mobility - Plan: PT plan of care cert/re-cert  Unsteadiness on feet - Plan: PT plan of care cert/re-cert  Muscle weakness (generalized) - Plan: PT plan of care cert/re-cert  Rationale for Evaluation and Treatment Rehabilitation  SUBJECTIVE:  SUBJECTIVE STATEMENT: Pt reports she continues to do well: walks some at home with the cane but not a lot:  pt states the step up exercise she has done is really beneficial in that this exercise will help her to step up onto her step stool in order to get into her bed at home - has been sleeping in her recliner chair because she can't step up to get into her bed, and wants to be able to do this Pt accompanied by: self  PERTINENT HISTORY:  JENNEL MARA is a 76 y.o. female with medical history significant of chronic diastolic CHF, CVA, chronic severe lymphedema, COPD and hypertension who presented with worsening swelling of the lower extremity.   PAIN:    Are you having pain? Yes: NPRS scale: 2-3/10 Pain location: low back and Rt knee Pain description: soreness Aggravating factors: weight bearing Relieving factors: non weight bearing positions Pt reports constant pain in low back and Rt knee;    Back pain varies   PRECAUTIONS: None  WEIGHT BEARING RESTRICTIONS No  FALLS: Has patient fallen in last 6 months? No  LIVING ENVIRONMENT: Lives with: lives alone and lives in an assisted living facility Lives in: Other assisted living facility Has following equipment  at home: Single point cane, Environmental consultant - 2 wheeled, Electronics engineer, and Grab bars  PLOF: Independent with basic ADLs, Independent with household mobility with device, and Independent with transfers  PATIENT GOALS "to walk with my cane"  Today's Treatment:   01-10-22  Gait:  Pt gait trained 115' with SPC on flat, even surface with CGA to SBA  Ramp training with SPC with CGA - ascending and descending ramp  Curb training - pt ascended curb with SPC with +1 mod HHA;  descended curb with SPC with +1 min assist - descended with RLE first   TherEx: Heel raises 10 reps bil. LE's  Unilateral heel raise each leg with bil. UE support on // bars 10 reps RLE and 5 reps LLE  Step ups RLE and LLE 10 reps with UE support on hand rail - 6" step used;  RLE 10 reps onto 8" step:  LLE 10 reps onto 8" step  Step down exercise for eccentric quad strengthening - 4" step used inside // bars for UE support - 10 reps LLE and 10 reps RLE  Sit to stand  - 5x - from mat table - no UE support used  Standing hip flexion, extension and abduction with 3# weight 10 reps each direction each leg  NeuroRe-ed: Tap ups to 6" step with CGA - 5 reps each leg - step placed inside // bars  Stepping over and back of balance beam 10 reps each leg with UE support on // bars  Marching in place 10 reps without UE support  PATIENT EDUCATION: Education details: sit to stand for functional strengthening Person educated: Patient Education method: Explanation Education comprehension: verbalized understanding   HOME EXERCISE PROGRAM: To be established    GOALS: Goals reviewed with patient? Yes  SHORT TERM GOALS: Target date: 02-17-22   PT Short Term Goals -        PT SHORT TERM GOAL #1   Title Increase gait velocity to >/= 2.2 ft/sec with RW for incr. gait efficiency.    Time 4    Period Weeks    Status    Target Date 02-17-22     PT SHORT TERM GOAL #2   Title Pt will improve TUG score to </=  15 secs with SPC to  demo improved functional mobility.    Time 4    Period Weeks    Status  17.09 secs with RW   Target Date 02-17-22     PT SHORT TERM GOAL #3   Title Pt will be modified independent with household amb. With use of SPC at least 50% time.   Time 4    Period Weeks    Status    Target Date 02-17-22     PT SHORT TERM GOAL #4   Title Pt will perform 5x sit to stand transfers from mat without UE support in </= 11 secs to demo improved LE strength.    Time 4   Period weeks   Status 12.56    Target Date 02-17-22             LONG TERM GOALS: Target date: 01/06/2022   PT Long Term Goals - 11/09/21 1949       PT LONG TERM GOAL #1   Title Independent in updated HEP for bil. strengthening and balance exercises.    Time 8    Period Weeks    Status Goal met 01-03-22   Target Date 01/06/22      PT LONG TERM GOAL #2   Title Improve 5x sit to stand score from 13.22 secs to </= 10 secs without UE support from mat.    Baseline 13.22 from mat; 12.56 secs    Time 8    Period Weeks    Status Goal met   Target Date 01/06/22      PT LONG TERM GOAL #3   Title Improve TUG score to </= 17 secs with SPC to demo improved mobility.    Baseline 20.37 secs with RW ; 18.81 secs with SPC   Time 8    Period Weeks    Status Goal not met 01-03-22     PT LONG TERM GOAL #4   Title Incr. gait velocity to >/= 2.0  ft/sec with SPC for incr. gait efficiency.    Baseline 1.55 ft/sec with RW ; 22.03 secs = 1.49 ft/sec   Time 8    Period Weeks    Status Goal not met 01-03-22   Target Date 01/06/22            NEW UPDATED LTG'S; TARGET DATE 03-17-22   PT Long Term Goals - 01/10/22 2014       PT LONG TERM GOAL #1   Title Pt will be modified independent with household amb. with SPC at least 75% time.    Time 8    Period Weeks    Status New    Target Date 03/17/22      PT LONG TERM GOAL #2   Title Improve 5x sit to stand score from 12.56 secs to </= 9 secs without UE support from mat.    Time 8     Period Weeks    Status New    Target Date 03/17/22      PT LONG TERM GOAL #3   Title Improve TUG score to </= 15 secs with SPC to demo improved mobility.    Time 8    Period Weeks    Status 18.81 secs with [facility]   Target Date 03/17/22      PT LONG TERM GOAL #4   Title Incr. gait velocity to >/= 2.0  ft/sec with SPC for incr. gait efficiency.    Time 8    Period 22.03  secs = 1.49 ft/sec with SPC   Status New      PT LONG TERM GOAL #5   Title Pt will report ability to step up onto step stool at home to get into her bed for sleeping.    Time 8    Period Weeks    Status New   Target Date 03/17/22                ASSESSMENT:  CLINICAL IMPRESSION: PT session focused on bil. LE strengthening and balance exercises with progressive decrease in UE support.  Pt able to perform step up exercise with bil. LE's onto 8" step for first time in today's session.  Pt reported no Rt knee pain with step up nor step down exercise.  Pt able to safely amb. With SPC on flat, even surface but needs mod HHA to step up onto curb with use of SPC.  Renewal completed for 8 additional weeks with frequency 1x/week.  Pt agrees with and requests renewal for continuation of services. Cont with POC.     OBJECTIVE IMPAIRMENTS decreased balance, decreased endurance, difficulty walking, decreased ROM, decreased strength, increased edema, and pain.   ACTIVITY LIMITATIONS carrying, lifting, bending, standing, squatting, stairs, transfers, and locomotion level  PARTICIPATION LIMITATIONS: meal prep, cleaning, laundry, driving, and shopping  PERSONAL FACTORS Past/current experiences, Time since onset of injury/illness/exacerbation, and 1 comorbidity: lymphedema  are also affecting patient's functional outcome.   REHAB POTENTIAL:  Good  CLINICAL DECISION MAKING: Stable/uncomplicated  EVALUATION COMPLEXITY: Low  PLAN: PT FREQUENCY: 1x/week  PT DURATION: 8 weeks  PLANNED INTERVENTIONS: Therapeutic  exercises, Therapeutic activity, Neuromuscular re-education, Balance training, Gait training, Patient/Family education, Self Care, Stair training, and DME instructions  PLAN FOR NEXT SESSION:  Cont with LE strengthening, balance and gait training   Kaniesha Barile, [place], PT 01/10/2022, 8:32 PM

## 2022-01-24 ENCOUNTER — Ambulatory Visit: Payer: Medicare Other | Attending: Internal Medicine | Admitting: Physical Therapy

## 2022-01-24 DIAGNOSIS — R2681 Unsteadiness on feet: Secondary | ICD-10-CM | POA: Insufficient documentation

## 2022-01-24 DIAGNOSIS — R2689 Other abnormalities of gait and mobility: Secondary | ICD-10-CM | POA: Insufficient documentation

## 2022-01-24 DIAGNOSIS — M6281 Muscle weakness (generalized): Secondary | ICD-10-CM | POA: Insufficient documentation

## 2022-01-24 NOTE — Therapy (Signed)
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    COPD (chronic obstructive pulmonary disease) ([place])    Coronary artery disease 1996   Known with prior mild lesion of LAD demonstrated by Cardiac Catheterization in 1996   Edema of foot    She has a history of chronic edema of the left dated back to age 39 when she suufered severe frostbite playing  on the snow as a child   Hypertension    Lower extremity edema    PAC (premature atrial contraction)    Pancreatitis    x2   PONV (postoperative nausea and vomiting)    problems waking up last time 4/16 only time   Saccular aneurysm    She also has 2 known which were stable between the MRA of October2010 and the MRA  of April 2011.   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 Has normal EF.   Past Surgical History:  Procedure Laterality Date   ABDOMINAL HYSTERECTOMY     APPENDECTOMY     CARDIAC CATHETERIZATION  1996   Mild CAD with vasospasm   CARDIOVASCULAR STRESS TEST  12-02-2001   EF 70%   CHOLECYSTECTOMY N/A 03/09/2016   Procedure: LAPAROSCOPIC CHOLECYSTECTOMY WITH INTRAOPERATIVE CHOLANGIOGRAM;  Surgeon: Georganna Skeans, MD;  Location: [place];  Service: General;  Laterality: N/A;   ENDARTERECTOMY Right 08/12/2014   Procedure: RIGHT  COMMON CAROTID ARTERY EXPOSURE FOR  INTERVENTIONAL RADIOLOGY PROCEDURE BY DR.DEVASHWAR,Insertion 6 FR sheath;  Surgeon: Elam Dutch, MD;  Location: [place];  Service: Vascular;  Laterality: Right;   ENDARTERECTOMY Right 08/12/2014   Procedure:  CAROTID  EXPOSURE CLOSURE RIGHT NECK, REPAIR RIGHT COMMON CAROTID ARTERY;  Surgeon: Elam Dutch, MD;  Location: [place];  Service: Vascular;  Laterality: Right;   ENDARTERECTOMY Left 11/18/2014   Procedure: CAROTID EXPOSURE;  Surgeon: Elam Dutch, MD;  Location: [place];  Service: Vascular;  Laterality: Left;   ENDARTERECTOMY Left 11/18/2014   Procedure: CLOSURE CAROTID;  Surgeon: Elam Dutch, MD;  Location: [place];  Service: Vascular;  Laterality: Left;   ENDARTERECTOMY Right 08/22/2017   Procedure: EXPOSURE CAROTID ARTERY FOR [place];  Surgeon: Elam Dutch, MD;  Location: [place];  Service: Vascular;  Laterality: Right;   IR ANGIO INTRA EXTRACRAN SEL INTERNAL CAROTID UNI R MOD SED  08/22/2017   IR ANGIOGRAM FOLLOW UP STUDY  08/22/2017   IR RADIOLOGIST EVAL & MGMT  06/26/2016   IR RADIOLOGIST EVAL & MGMT  05/08/2017   IR RADIOLOGY PERIPHERAL GUIDED IV START  12/03/2017   IR TRANSCATH/EMBOLIZ  08/22/2017   IR US GUIDE VASC ACCESS RIGHT  12/03/2017   LAPAROSCOPY     NECK SURGERY  50 yrs ago   Left side tumor   RADIOLOGY  WITH ANESTHESIA N/A 05/25/2014   Procedure: RADIOLOGY WITH ANESTHESIA;  Surgeon: [name], MD;  Location: [place];  Service: Radiology;  Laterality: N/A;   RADIOLOGY WITH ANESTHESIA N/A 08/12/2014   Procedure: RADIOLOGY WITH ANESTHESIA;  Surgeon: [name], MD;  Location: [place];  Service: Radiology;  Laterality: N/A;   RADIOLOGY WITH ANESTHESIA N/A 03/15/2015   Procedure: MRI OF BRAIN WITHOUT CONTRAST;  Surgeon: Medication Radiologist, MD;  Location: [place];  Service: Radiology;  Laterality: N/A;  DR. TAT/MRI   US ECHOCARDIOGRAPHY  04-26-2009   EF 65-70%   VESICOVAGINAL FISTULA CLOSURE W/ TAH  25 yrs ago   WOUND EXPLORATION Right 08/22/2017    Procedure: REMOVAL OF RIGHT COMMON CAROTID SHEATH AND WOUND CLOSURE;  Surgeon: Rosetta Posner, MD;  Location[facility];  Service: Vascular;  Laterality: Right;   Patient Active Problem List   Diagnosis Date Noted   Acute on chronic diastolic (congestive) heart failure ([place]) 06/30/2021   History of CVA (cerebrovascular accident) 06/28/2021   Chronic diastolic CHF (congestive heart failure) ([place]) 06/28/2021   Lymphedema    Acute on chronic diastolic CHF (congestive heart failure) ([place]) 04/16/2021   COPD (chronic obstructive pulmonary disease) ([place])    Acute encephalopathy 04/15/2021   Edema of both lower extremities due to peripheral venous insufficiency 07/13/2020   Acute cholecystitis 03/07/2016   Small vessel disease, cerebrovascular 05/31/2015   Aneurysm, cerebral, nonruptured 05/31/2015   Dizziness and giddiness 05/31/2015   Altered mental status    Acute CVA (cerebrovascular accident) ([place]) 03/15/2015   Dysphasia 03/12/2015   Thrombocytopenia ([place]) 03/12/2015   Hypokalemia    Endotracheally intubated    Essential hypertension    Intracranial aneurysm 11/18/2014   Respiratory failure ([facility])    Cerebral aneurysm    Brain aneurysm    Aphasia    Palmar erythema    CVA (cerebral infarction) 05/02/2014   Aneurysm ([place]) 05/02/2014   Expressive aphasia 05/02/2014   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   Tobacco abuse 10/24/2010   TIA (transient ischemic attack)    Hypertension    Ventricular hypertrophy    CVA 02/23/2010   STRESS FRACTURE, FOOT 02/22/2010    ONSET DATE: March 2023:  Referral date 10-28-21  REFERRING DIAG: R26.0 (ICD-10-CM) - Ataxic gait I67.1 (ICD-10-CM) - Cerebral aneurysm, nonruptured   THERAPY DIAG:  No diagnosis found.  Rationale for Evaluation and Treatment Rehabilitation  SUBJECTIVE:  SUBJECTIVE STATEMENT: Pt reports she continues to do well: walks some at home with the cane but not a lot:  pt states the step up exercise she has done is really beneficial in that this exercise will help her to step up onto her step stool in order to get into her bed at home - has been sleeping in her recliner chair because she can't step up to get into her bed, and wants to be able to do this Pt accompanied by: self  PERTINENT HISTORY:  RENIAH COTTINGHAM is a 77 y.o. female with medical history significant of chronic diastolic CHF, CVA, chronic severe lymphedema, COPD and hypertension who presented with worsening swelling of the lower extremity.   PAIN:    Are you having pain? Yes: NPRS scale: 2-3/10 Pain location: low back and Rt knee Pain description: soreness Aggravating factors: weight bearing Relieving factors: non weight bearing positions Pt reports constant pain in low back and Rt knee;    Back pain varies   PRECAUTIONS: None  WEIGHT BEARING RESTRICTIONS No  FALLS: Has patient fallen in last 6 months? No  LIVING ENVIRONMENT: Lives with: lives alone and lives in an assisted living facility Lives in: Other assisted living facility Has following equipment at home: Single point cane, Environmental consultant - 2 wheeled, Electronics engineer, and Grab bars  PLOF: Independent with basic ADLs, Independent with household mobility with device, and Independent with transfers  PATIENT GOALS "to walk with my cane"  Today's Treatment:   01-10-22  Gait:  Pt gait trained 115' with SPC on flat, even surface with CGA to SBA  Ramp training with SPC with CGA - ascending and descending ramp  Curb training - pt ascended curb with SPC with +1 mod HHA;  descended curb with SPC with +1 min assist - descended with RLE first   TherEx: Heel raises 10 reps bil. LE's  Unilateral heel raise each leg with bil. UE support on // bars 10 reps RLE and 5 reps LLE  Step  ups RLE and LLE 10 reps with UE support on hand rail - 6" step used;  RLE 10 reps onto 8" step:  LLE 10 reps onto 8" step  Step down exercise for eccentric quad strengthening - 4" step used inside // bars for UE support - 10 reps LLE and 10 reps RLE  Sit to stand  - 10x - from mat table - no UE support used  Standing hip flexion, extension and abduction with 3# weight 10 reps each direction each leg  NeuroRe-ed: Tap ups to 6" step with CGA - 5 reps each leg - step placed inside // bars  Stepping over and back of balance beam 10 reps each leg with UE support on // bars  Marching in place 10 reps without UE support  PATIENT EDUCATION: Education details: sit to stand for functional strengthening Person educated: Patient Education method: Explanation Education comprehension: verbalized understanding   HOME EXERCISE PROGRAM: To be established    GOALS: Goals reviewed with patient? Yes  SHORT TERM GOALS: Target date: 02-17-22   PT Short Term Goals -        PT SHORT TERM GOAL #1   Title Increase gait velocity to >/= 2.2 ft/sec with RW for incr. gait efficiency.    Time 4    Period Weeks    Status    Target Date 02-17-22     PT SHORT TERM GOAL #2   Title Pt will improve TUG score to </=  15 secs with SPC to demo improved functional mobility.    Time 4    Period Weeks    Status  17.09 secs with RW   Target Date 02-17-22     PT SHORT TERM GOAL #3   Title Pt will be modified independent with household amb. With use of SPC at least 50% time.   Time 4    Period Weeks    Status    Target Date 02-17-22     PT SHORT TERM GOAL #4   Title Pt will perform 5x sit to stand transfers from mat without UE support in </= 11 secs to demo improved LE strength.    Time 4   Period weeks   Status 12.56    Target Date 02-17-22             LONG TERM GOALS: Target date: 01/06/2022   PT Long Term Goals - 11/09/21 1949       PT LONG TERM GOAL #1   Title Independent in updated HEP  for bil. strengthening and balance exercises.    Time 8    Period Weeks    Status Goal met 01-03-22   Target Date 01/06/22      PT LONG TERM GOAL #2   Title Improve 5x sit to stand score from 13.22 secs to </= 10 secs without UE support from mat.    Baseline 13.22 from mat; 12.56 secs    Time 8    Period Weeks    Status Goal met   Target Date 01/06/22      PT LONG TERM GOAL #3   Title Improve TUG score to </= 17 secs with SPC to demo improved mobility.    Baseline 20.37 secs with RW ; 18.81 secs with SPC   Time 8    Period Weeks    Status Goal not met 01-03-22     PT LONG TERM GOAL #4   Title Incr. gait velocity to >/= 2.0  ft/sec with SPC for incr. gait efficiency.    Baseline 1.55 ft/sec with RW ; 22.03 secs = 1.49 ft/sec   Time 8    Period Weeks    Status Goal not met 01-03-22   Target Date 01/06/22            NEW UPDATED LTG'S; TARGET DATE 03-17-22   PT Long Term Goals - 01/10/22 2014       PT LONG TERM GOAL #1   Title Pt will be modified independent with household amb. with SPC at least 75% time.    Time 8    Period Weeks    Status New    Target Date 03/17/22      PT LONG TERM GOAL #2   Title Improve 5x sit to stand score from 12.56 secs to </= 9 secs without UE support from mat.    Time 8    Period Weeks    Status New    Target Date 03/17/22      PT LONG TERM GOAL #3   Title Improve TUG score to </= 15 secs with SPC to demo improved mobility.    Time 8    Period Weeks    Status 18.81 secs with [facility]   Target Date 03/17/22      PT LONG TERM GOAL #4   Title Incr. gait velocity to >/= 2.0  ft/sec with SPC for incr. gait efficiency.    Time 8    Period 22.03 secs =  1.49 ft/sec with SPC   Status New      PT LONG TERM GOAL #5   Title Pt will report ability to step up onto step stool at home to get into her bed for sleeping.    Time 8    Period Weeks    Status New   Target Date 03/17/22                ASSESSMENT:  CLINICAL IMPRESSION: PT  session focused on bil. LE strengthening and balance exercises with progressive decrease in UE support.  Pt able to perform step up exercise with bil. LE's onto 8" step for first time in today's session.  Pt reported no Rt knee pain with step up nor step down exercise.  Pt able to safely amb. With SPC on flat, even surface but needs mod HHA to step up onto curb with use of SPC.  Renewal completed for 8 additional weeks with frequency 1x/week.  Pt agrees with and requests renewal for continuation of services. Cont with POC.     OBJECTIVE IMPAIRMENTS decreased balance, decreased endurance, difficulty walking, decreased ROM, decreased strength, increased edema, and pain.   ACTIVITY LIMITATIONS carrying, lifting, bending, standing, squatting, stairs, transfers, and locomotion level  PARTICIPATION LIMITATIONS: meal prep, cleaning, laundry, driving, and shopping  PERSONAL FACTORS Past/current experiences, Time since onset of injury/illness/exacerbation, and 1 comorbidity: lymphedema  are also affecting patient's functional outcome.   REHAB POTENTIAL:  Good  CLINICAL DECISION MAKING: Stable/uncomplicated  EVALUATION COMPLEXITY: Low  PLAN: PT FREQUENCY: 1x/week  PT DURATION: 8 weeks  PLANNED INTERVENTIONS: Therapeutic exercises, Therapeutic activity, Neuromuscular re-education, Balance training, Gait training, Patient/Family education, Self Care, Stair training, and DME instructions  PLAN FOR NEXT SESSION:  Cont with LE strengthening, balance and gait training   Davyd Podgorski, [place], PT 01/24/2022, 8:50 AM

## 2022-01-25 ENCOUNTER — Encounter: Payer: Self-pay | Admitting: Physical Therapy

## 2022-01-31 LAB — LAB REPORT - SCANNED: EGFR: 69.4

## 2022-02-14 ENCOUNTER — Ambulatory Visit: Payer: Medicare Other | Admitting: Physical Therapy

## 2022-02-14 DIAGNOSIS — M6281 Muscle weakness (generalized): Secondary | ICD-10-CM | POA: Diagnosis not present

## 2022-02-14 DIAGNOSIS — R2689 Other abnormalities of gait and mobility: Secondary | ICD-10-CM

## 2022-02-14 DIAGNOSIS — R2681 Unsteadiness on feet: Secondary | ICD-10-CM

## 2022-02-14 NOTE — Therapy (Signed)
OUTPATIENT PHYSICAL THERAPY NEURO TREATMENT NOTE/10th VISIT PROGRESS NOTE    Progress Note Reporting Period 11-08-21 to 02-14-22  See note below for Objective Data and Assessment of Progress/Goals.      Patient Name: Jeanne Lawson MRN: [id number] DOB:01/08/1946, 77 y.o., female Today's Date: 02/15/2022   PCP: [name], MD  REFERRING PROVIDER: [name], MD    PT End of Session - 02/15/22 1635     Visit Number 10    Number of Visits 16    Date for PT Re-Evaluation 03/10/22    Authorization Type [employer] Medicare    Authorization Time Period 11-08-21 - 01-22-22;  01-10-22 - 03-23-22    PT Start Time 0930    PT Stop Time 1016    PT Time Calculation (min) 46 min    Activity Tolerance Patient tolerated treatment well    Behavior During Therapy [facility] for tasks assessed/performed                  Past Medical History:  Diagnosis Date   Acute on chronic diastolic CHF (congestive heart failure) ([place]) 04/16/2021   Cancer (HCC)    right leg skin    COPD (chronic obstructive pulmonary disease) ([place])    Coronary artery disease 1996   Known with prior mild lesion of LAD demonstrated by Cardiac Catheterization in 1996   Edema of foot    She has a history of chronic edema of the left dated back to age 22 when she suufered severe frostbite playing  on the snow as a child   Hypertension    Lower extremity edema    PAC (premature atrial contraction)    Pancreatitis    x2   PONV (postoperative nausea and vomiting)    problems waking up last time 4/16 only time   Saccular aneurysm    She also has 2 known which were stable between the MRA of October2010 and the MRA  of April 2011.   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 Has normal EF.   Past Surgical History:  Procedure Laterality Date   ABDOMINAL HYSTERECTOMY     APPENDECTOMY     CARDIAC CATHETERIZATION  1996   Mild CAD with vasospasm   CARDIOVASCULAR STRESS TEST  12-02-2001   EF 70%   CHOLECYSTECTOMY N/A 03/09/2016   Procedure: LAPAROSCOPIC CHOLECYSTECTOMY WITH INTRAOPERATIVE CHOLANGIOGRAM;  Surgeon: Georganna Skeans, MD;  Location: [place];  Service: General;  Laterality: N/A;   ENDARTERECTOMY Right 08/12/2014   Procedure: RIGHT  COMMON CAROTID ARTERY EXPOSURE FOR INTERVENTIONAL RADIOLOGY PROCEDURE BY DR.DEVASHWAR,Insertion 6 FR sheath;  Surgeon: Elam Dutch, MD;  Location: [place];  Service: Vascular;  Laterality: Right;   ENDARTERECTOMY Right 08/12/2014   Procedure:  CAROTID  EXPOSURE CLOSURE RIGHT NECK, REPAIR RIGHT COMMON CAROTID ARTERY;  Surgeon: Elam Dutch, MD;  Location: [place];  Service: Vascular;  Laterality: Right;   ENDARTERECTOMY Left 11/18/2014   Procedure: CAROTID EXPOSURE;  Surgeon: Elam Dutch, MD;  Location: [place];  Service: Vascular;  Laterality: Left;   ENDARTERECTOMY Left 11/18/2014   Procedure: CLOSURE CAROTID;  Surgeon: Elam Dutch, MD;  Location: [facility];  Service: Vascular;  Laterality: Left;   ENDARTERECTOMY Right 08/22/2017   Procedure: EXPOSURE CAROTID ARTERY FOR DEVESHWAR SURGERY;  Surgeon: Elam Dutch, MD;  Location[facility];  Service: Vascular;  Laterality: Right;   IR ANGIO INTRA EXTRACRAN SEL INTERNAL CAROTID UNI R MOD SED  08/22/2017   IR ANGIOGRAM FOLLOW UP STUDY  08/22/2017   IR RADIOLOGIST EVAL & MGMT  06/26/2016   IR RADIOLOGIST EVAL & MGMT  05/08/2017   IR RADIOLOGY PERIPHERAL GUIDED IV START  12/03/2017   IR TRANSCATH/EMBOLIZ  08/22/2017   IR US GUIDE VASC ACCESS RIGHT  12/03/2017   LAPAROSCOPY     NECK SURGERY  50 yrs ago   Left side tumor   RADIOLOGY WITH ANESTHESIA N/A 05/25/2014   Procedure: RADIOLOGY WITH ANESTHESIA;  Surgeon:  [name], MD;  Location: [place];  Service: Radiology;  Laterality: N/A;   RADIOLOGY WITH ANESTHESIA N/A 08/12/2014   Procedure: RADIOLOGY WITH ANESTHESIA;  Surgeon: [name], MD;  Location: [place];  Service: Radiology;  Laterality: N/A;   RADIOLOGY WITH ANESTHESIA N/A 03/15/2015   Procedure: MRI OF BRAIN WITHOUT CONTRAST;  Surgeon: Medication Radiologist, MD;  Location: [place];  Service: Radiology;  Laterality: N/A;  DR. TAT/MRI   US ECHOCARDIOGRAPHY  04-26-2009   EF 65-70%   VESICOVAGINAL FISTULA CLOSURE W/ TAH  25 yrs ago   WOUND EXPLORATION Right 08/22/2017   Procedure: REMOVAL OF RIGHT COMMON CAROTID SHEATH AND WOUND CLOSURE;  Surgeon: Rosetta Posner, MD;  Location: [facility];  Service: Vascular;  Laterality: Right;   Patient Active Problem List   Diagnosis Date Noted   Acute on chronic diastolic (congestive) heart failure ([place]) 06/30/2021   History of CVA (cerebrovascular accident) 06/28/2021   Chronic diastolic CHF (congestive heart failure) ([place]) 06/28/2021   Lymphedema    Acute on chronic diastolic CHF (congestive heart failure) ([place]) 04/16/2021   COPD (chronic obstructive pulmonary disease) ([place])    Acute encephalopathy 04/15/2021   Edema of both lower extremities due to peripheral venous insufficiency 07/13/2020   Acute cholecystitis 03/07/2016   Small vessel disease, cerebrovascular 05/31/2015   Aneurysm, cerebral, nonruptured 05/31/2015   Dizziness and giddiness 05/31/2015   Altered mental status    Acute CVA (cerebrovascular accident) ([place]) 03/15/2015   Dysphasia 03/12/2015   Thrombocytopenia ([place]) 03/12/2015   Hypokalemia    Endotracheally intubated    Essential hypertension    Intracranial aneurysm 11/18/2014   Respiratory failure (HCC)    Cerebral aneurysm    Brain aneurysm    Aphasia    Palmar erythema    CVA (cerebral infarction) 05/02/2014   Aneurysm ([place]) 05/02/2014   Expressive aphasia 05/02/2014   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   Chronic ischemic  heart disease 11/13/2011   Tobacco abuse 10/24/2010   TIA (transient ischemic attack)    Hypertension    Ventricular hypertrophy    CVA 02/23/2010   STRESS FRACTURE, FOOT 02/22/2010    ONSET DATE: March 2023:  Referral date 10-28-21  REFERRING DIAG: R26.0 (ICD-10-CM) - Ataxic gait I67.1 (ICD-10-CM) - Cerebral aneurysm, nonruptured   THERAPY DIAG:  Muscle weakness (generalized)  Other abnormalities of gait and mobility  Unsteadiness on feet  Rationale for Evaluation and Treatment Rehabilitation  SUBJECTIVE:  SUBJECTIVE STATEMENT: Pt reports she is doing better; had MD appt with PCP for annual exam last week and states he was pleased with her progress; states she went into grocery store by herself a couple of weeks ago because the bus driver was unable to go in with her; states she used the buggy and did OK Pt accompanied by: self  PERTINENT HISTORY:  ELLIN FITZGIBBONS is a 77 y.o. female with medical history significant of chronic diastolic CHF, CVA, chronic severe lymphedema, COPD and hypertension who presented with worsening swelling of the lower extremity.   PAIN:    Are you having pain? Yes: NPRS scale: 2-3/10 Pain location: low back and Rt knee Pain description: soreness Aggravating factors: weight bearing Relieving factors: non weight bearing positions Pt reports constant pain in low back and Rt knee;    Back pain varies   PRECAUTIONS: None  WEIGHT BEARING RESTRICTIONS No  FALLS: Has patient fallen in last 6 months? No  LIVING ENVIRONMENT: Lives with: lives alone and lives in an assisted living facility Lives in: Other assisted living facility Has following equipment at home: Single point cane, Environmental consultant - 2 wheeled, Electronics engineer, and Grab bars  PLOF: Independent with basic ADLs,  Independent with household mobility with device, and Independent with transfers  PATIENT GOALS "to walk with my cane"   Today's Treatment:   02-14-22  Gait:  Pt gait trained 50' with SPC on flat, even surface with CGA to SBA   TherEx: Heel raises 10 reps bil. LE's  Unilateral heel raise each leg with bil. UE support on // bars 10 reps RLE and 5 reps LLE  Step ups RLE and LLE 10 reps with UE support on hand rail - 6" step used;  RLE 5 reps onto 8" step:  LLE 5 reps onto 8" step with bil. UE support  Step down exercise for eccentric quad strengthening - 4" step used inside // bars for UE support - 10 reps LLE and 10 reps RLE  Sit to stand  - 10x - from high low mat table - no UE support used  SciFit - level 3.0 with bil. Ue's and LE's x 6"   Sidestepping 10' x 2 reps with squats inside // bars with bil. UE support on // bars   NeuroRe-ed: Tap ups to 6" step with CGA - 10 reps each LE- with UE support on // bars prn  Stepping over and back of balance beam 10 reps each leg with UE support on // bars; stepping laterally over balance beam inside // bars 10 reps each leg with UE support ; cues to lift LLE high for improved clearance  Marching in place 10 reps without UE support  PATIENT EDUCATION: Education details: sit to stand for functional strengthening Person educated: Patient Education method: Explanation Education comprehension: verbalized understanding   HOME EXERCISE PROGRAM: To be established    GOALS: Goals reviewed with patient? Yes  SHORT TERM GOALS: Target date: 02-17-22   PT Short Term Goals -        PT SHORT TERM GOAL #1   Title Increase gait velocity to >/= 2.2 ft/sec with RW for incr. gait efficiency.    Time 4    Period Weeks    Status 17.97 secs; 1.83 ft/sec; 15.6 secs =Partially met = 2.10 ft/sec    Target Date 02-17-22     PT SHORT TERM GOAL #2   Title Pt will improve TUG score to </= 15 secs with SPC  to demo improved functional mobility.     Time 4    Period Weeks    Status  17.09 secs with RW; 16.7 secs with SPC;  Partially met    Target Date 02-17-22     PT SHORT TERM GOAL #3   Title Pt will be modified independent with household amb. With use of SPC at least 50% time.   Time 4    Period Weeks    Status  Not met - pt reports using cane approx. 10% of time   Target Date 02-17-22     PT SHORT TERM GOAL #4   Title Pt will perform 5x sit to stand transfers from mat without UE support in </= 11 secs to demo improved LE strength.    Time 4   Period weeks   Status 12.56 ;  Partially met - 12.1 secs 02-14-22   Target Date 02-17-22             LONG TERM GOALS: Target date: 01/06/2022   PT Long Term Goals - 11/09/21 1949       PT LONG TERM GOAL #1   Title Independent in updated HEP for bil. strengthening and balance exercises.    Time 8    Period Weeks    Status Goal met 01-03-22   Target Date 01/06/22      PT LONG TERM GOAL #2   Title Improve 5x sit to stand score from 13.22 secs to </= 10 secs without UE support from mat.    Baseline 13.22 from mat; 12.56 secs    Time 8    Period Weeks    Status Goal met   Target Date 01/06/22      PT LONG TERM GOAL #3   Title Improve TUG score to </= 17 secs with SPC to demo improved mobility.    Baseline 20.37 secs with RW ; 18.81 secs with SPC   Time 8    Period Weeks    Status Goal not met 01-03-22     PT LONG TERM GOAL #4   Title Incr. gait velocity to >/= 2.0  ft/sec with SPC for incr. gait efficiency.    Baseline 1.55 ft/sec with RW ; 22.03 secs = 1.49 ft/sec   Time 8    Period Weeks    Status Goal not met 01-03-22   Target Date 01/06/22            NEW UPDATED LTG'S; TARGET DATE 03-17-22   PT Long Term Goals - 01/10/22 2014       PT LONG TERM GOAL #1   Title Pt will be modified independent with household amb. with SPC at least 75% time.    Time 8    Period Weeks    Status New    Target Date 03/17/22      PT LONG TERM GOAL #2   Title Improve 5x sit  to stand score from 12.56 secs to </= 9 secs without UE support from mat.    Time 8    Period Weeks    Status New    Target Date 03/17/22      PT LONG TERM GOAL #3   Title Improve TUG score to </= 15 secs with SPC to demo improved mobility.    Time 8    Period Weeks    Status 18.81 secs with [facility]   Target Date 03/17/22      PT LONG TERM GOAL #4   Title Incr. gait  velocity to >/= 2.0  ft/sec with SPC for incr. gait efficiency.    Time 8    Period 22.03 secs = 1.49 ft/sec with SPC   Status New      PT LONG TERM GOAL #5   Title Pt will report ability to step up onto step stool at home to get into her bed for sleeping.    Time 8    Period Weeks    Status New   Target Date 03/17/22                ASSESSMENT:  CLINICAL IMPRESSION: This 10th visit progress note covers dates 11-08-21 - 02-14-22:  Pt has partially met STG's #1,2 and 4; STG #3 not met as pt reports using SPC in home approx. 10% of the time (usually uses RW to be safe);  scores have improved with gait velocity with use of RW and TUG score with use of SPC, but not to stated goal level.  PT session focused on bil. LE strengthening and balance training.  Pt continues to need UE support for SLS balance activities/exercises.  Pt is progressing with improving strength in bil. LE's; lymphedema in LE's continues to impact flexibility and limit AROM. Cont with POC.     OBJECTIVE IMPAIRMENTS decreased balance, decreased endurance, difficulty walking, decreased ROM, decreased strength, increased edema, and pain.   ACTIVITY LIMITATIONS carrying, lifting, bending, standing, squatting, stairs, transfers, and locomotion level  PARTICIPATION LIMITATIONS: meal prep, cleaning, laundry, driving, and shopping  PERSONAL FACTORS Past/current experiences, Time since onset of injury/illness/exacerbation, and 1 comorbidity: lymphedema  are also affecting patient's functional outcome.   REHAB POTENTIAL:  Good  CLINICAL DECISION MAKING:  Stable/uncomplicated  EVALUATION COMPLEXITY: Low  PLAN: PT FREQUENCY: 1x/week  PT DURATION: 8 weeks  PLANNED INTERVENTIONS: Therapeutic exercises, Therapeutic activity, Neuromuscular re-education, Balance training, Gait training, Patient/Family education, Self Care, Stair training, and DME instructions  PLAN FOR NEXT SESSION: Cont with LE strengthening, balance and gait training   Clarrisa Kaylor, [place], PT 02/15/2022, 4:40 PM

## 2022-02-15 ENCOUNTER — Encounter: Payer: Self-pay | Admitting: Physical Therapy

## 2022-02-21 ENCOUNTER — Ambulatory Visit: Payer: Medicare Other | Admitting: Physical Therapy

## 2022-02-21 DIAGNOSIS — R2689 Other abnormalities of gait and mobility: Secondary | ICD-10-CM

## 2022-02-21 DIAGNOSIS — R2681 Unsteadiness on feet: Secondary | ICD-10-CM

## 2022-02-21 DIAGNOSIS — M6281 Muscle weakness (generalized): Secondary | ICD-10-CM | POA: Diagnosis not present

## 2022-02-21 NOTE — Therapy (Signed)
OUTPATIENT PHYSICAL THERAPY NEURO TREATMENT NOTE/10th VISIT PROGRESS NOTE    Progress Note Reporting Period 11-08-21 to 02-14-22  See note below for Objective Data and Assessment of Progress/Goals.      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    COPD (chronic obstructive pulmonary disease) ([place])    Coronary artery disease 1996   Known with prior mild lesion of LAD demonstrated by Cardiac Catheterization in 1996   Edema of foot    She has a history of chronic edema of the left dated back to age 74 when she suufered severe frostbite playing  on the snow as a child   Hypertension    Lower extremity edema    PAC (premature atrial contraction)    Pancreatitis    x2   PONV (postoperative nausea and vomiting)    problems waking up last time 4/16 only time   Saccular aneurysm    She also has 2 known which were stable between the MRA of October2010 and the MRA  of April 2011.   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 Has normal EF.   Past Surgical History:  Procedure Laterality Date   ABDOMINAL HYSTERECTOMY     APPENDECTOMY     CARDIAC CATHETERIZATION  1996   Mild CAD with vasospasm   CARDIOVASCULAR STRESS TEST  12-02-2001   EF 70%   CHOLECYSTECTOMY N/A 03/09/2016   Procedure: LAPAROSCOPIC CHOLECYSTECTOMY WITH INTRAOPERATIVE CHOLANGIOGRAM;  Surgeon: Jeanne Skeans,  MD;  Location: [facility];  Service: General;  Laterality: N/A;   ENDARTERECTOMY Right 08/12/2014   Procedure: RIGHT  COMMON CAROTID ARTERY EXPOSURE FOR INTERVENTIONAL RADIOLOGY PROCEDURE BY DR.DEVASHWAR,Insertion 6 FR sheath;  Surgeon: Elam Dutch, MD;  Location: [place];  Service: Vascular;  Laterality: Right;   ENDARTERECTOMY Right 08/12/2014   Procedure:  CAROTID  EXPOSURE CLOSURE RIGHT NECK, REPAIR RIGHT COMMON CAROTID ARTERY;  Surgeon: Elam Dutch, MD;  Location: [place];  Service: Vascular;  Laterality: Right;   ENDARTERECTOMY Left 11/18/2014   Procedure: CAROTID EXPOSURE;  Surgeon: Elam Dutch, MD;  Location: [place];  Service: Vascular;  Laterality: Left;   ENDARTERECTOMY Left 11/18/2014   Procedure: CLOSURE CAROTID;  Surgeon: Elam Dutch, MD;  Location: [place];  Service: Vascular;  Laterality: Left;   ENDARTERECTOMY Right 08/22/2017   Procedure: EXPOSURE CAROTID ARTERY FOR [place];  Surgeon: Elam Dutch, MD;  Location: [place];  Service: Vascular;  Laterality: Right;   IR ANGIO INTRA EXTRACRAN SEL INTERNAL CAROTID UNI R MOD SED  08/22/2017   IR ANGIOGRAM FOLLOW UP STUDY  08/22/2017   IR RADIOLOGIST EVAL & MGMT  06/26/2016   IR RADIOLOGIST EVAL & MGMT  05/08/2017   IR RADIOLOGY PERIPHERAL GUIDED IV START  12/03/2017   IR TRANSCATH/EMBOLIZ  08/22/2017   IR  US GUIDE VASC ACCESS RIGHT  12/03/2017   LAPAROSCOPY     NECK SURGERY  50 yrs ago   Left side tumor   RADIOLOGY WITH ANESTHESIA N/A 05/25/2014   Procedure: RADIOLOGY WITH ANESTHESIA;  Surgeon: [name], MD;  Location: [place];  Service: Radiology;  Laterality: N/A;   RADIOLOGY WITH ANESTHESIA N/A 08/12/2014   Procedure: RADIOLOGY WITH ANESTHESIA;  Surgeon: [name], MD;  Location: [place];  Service: Radiology;  Laterality: N/A;   RADIOLOGY WITH ANESTHESIA N/A 03/15/2015   Procedure: MRI OF BRAIN WITHOUT CONTRAST;  Surgeon: Medication Radiologist, MD;  Location: [place];  Service: Radiology;  Laterality: N/A;   DR. TAT/MRI   US ECHOCARDIOGRAPHY  04-26-2009   EF 65-70%   VESICOVAGINAL FISTULA CLOSURE W/ TAH  25 yrs ago   WOUND EXPLORATION Right 08/22/2017   Procedure: REMOVAL OF RIGHT COMMON CAROTID SHEATH AND WOUND CLOSURE;  Surgeon: Rosetta Posner, MD;  Location[facility];  Service: Vascular;  Laterality: Right;   Patient Active Problem List   Diagnosis Date Noted   Acute on chronic diastolic (congestive) heart failure ([place]) 06/30/2021   History of CVA (cerebrovascular accident) 06/28/2021   Chronic diastolic CHF (congestive heart failure) ([place]) 06/28/2021   Lymphedema    Acute on chronic diastolic CHF (congestive heart failure) ([place]) 04/16/2021   COPD (chronic obstructive pulmonary disease) ([place])    Acute encephalopathy 04/15/2021   Edema of both lower extremities due to peripheral venous insufficiency 07/13/2020   Acute cholecystitis 03/07/2016   Small vessel disease, cerebrovascular 05/31/2015   Aneurysm, cerebral, nonruptured 05/31/2015   Dizziness and giddiness 05/31/2015   Altered mental status    Acute CVA (cerebrovascular accident) ([place]) 03/15/2015   Dysphasia 03/12/2015   Thrombocytopenia ([place]) 03/12/2015   Hypokalemia    Endotracheally intubated    Essential hypertension    Intracranial aneurysm 11/18/2014   Respiratory failure ([facility])    Cerebral aneurysm    Brain aneurysm    Aphasia    Palmar erythema    CVA (cerebral infarction) 05/02/2014   Aneurysm ([place]) 05/02/2014   Expressive aphasia 05/02/2014   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   Tobacco abuse 10/24/2010   TIA (transient ischemic attack)    Hypertension    Ventricular hypertrophy    CVA 02/23/2010   STRESS FRACTURE, FOOT 02/22/2010    ONSET DATE: March 2023:  Referral date 10-28-21  REFERRING DIAG: R26.0 (ICD-10-CM) - Ataxic gait I67.1 (ICD-10-CM) - Cerebral aneurysm, nonruptured   THERAPY DIAG:  No diagnosis found.  Rationale for Evaluation and Treatment  Rehabilitation  SUBJECTIVE:  SUBJECTIVE STATEMENT:  Pt   Pt accompanied by: self  PERTINENT HISTORY:  Jeanne Lawson is a 77 y.o. female with medical history significant of chronic diastolic CHF, CVA, chronic severe lymphedema, COPD and hypertension who presented with worsening swelling of the lower extremity.   PAIN:    Are you having pain? Yes: NPRS scale: 2-3/10 Pain location: low back and Rt knee Pain description: soreness Aggravating factors: weight bearing Relieving factors: non weight bearing positions Pt reports constant pain in low back and Rt knee;    Back pain varies   PRECAUTIONS: None  WEIGHT BEARING RESTRICTIONS No  FALLS: Has patient fallen in last 6 months? No  LIVING ENVIRONMENT: Lives with: lives alone and lives in an assisted living facility Lives in: Other assisted living facility Has following equipment at home: Single point cane, Environmental consultant - 2 wheeled, Electronics engineer, and Grab bars  PLOF: Independent with basic ADLs, Independent with household mobility with device, and Independent with transfers  PATIENT GOALS "to walk with my cane"   Today's Treatment:   02-21-22  Gait:  Pt gait trained 50' with SPC on flat, even surface with CGA to SBA   TherEx: Heel raises 10 reps bil. LE's  Unilateral heel raise each leg with bil. UE support on // bars 10 reps RLE and 5 reps LLE  Step ups RLE and LLE 10 reps with UE support on hand rail - 6" step used;  RLE 5 reps onto 8" step:  LLE 5 reps onto 8" step with bil. UE support  Step down exercise for eccentric quad strengthening - 4" step used inside // bars for UE support - 10 reps LLE and 10 reps RLE  Sit to stand  - 10x - from high low mat table - no UE support used  SciFit - level 3.0 with bil. Ue's and LE's x 6"    Sidestepping 10' x 2 reps with squats inside // bars with bil. UE support on // bars   NeuroRe-ed: Tap ups to 6" step with CGA - 10 reps each LE- with UE support on // bars prn  Stepping over and back of balance beam 10 reps each leg with UE support on // bars; stepping laterally over balance beam inside // bars 10 reps each leg with UE support ; cues to lift LLE high for improved clearance  Marching in place 10 reps without UE support  PATIENT EDUCATION: Education details: sit to stand for functional strengthening Person educated: Patient Education method: Explanation Education comprehension: verbalized understanding   HOME EXERCISE PROGRAM: To be established    GOALS: Goals reviewed with patient? Yes  SHORT TERM GOALS: Target date: 02-17-22   PT Short Term Goals -        PT SHORT TERM GOAL #1   Title Increase gait velocity to >/= 2.2 ft/sec with RW for incr. gait efficiency.    Time 4    Period Weeks    Status 17.97 secs; 1.83 ft/sec; 15.6 secs =Partially met = 2.10 ft/sec    Target Date 02-17-22     PT SHORT TERM GOAL #2   Title Pt will improve TUG score to </= 15 secs with SPC to demo improved functional mobility.    Time 4    Period Weeks    Status  17.09 secs with RW; 16.7 secs with SPC;  Partially met    Target Date 02-17-22     PT SHORT TERM GOAL #3   Title Pt will be  modified independent with household amb. With use of SPC at least 50% time.   Time 4    Period Weeks    Status  Not met - pt reports using cane approx. 10% of time   Target Date 02-17-22     PT SHORT TERM GOAL #4   Title Pt will perform 5x sit to stand transfers from mat without UE support in </= 11 secs to demo improved LE strength.    Time 4   Period weeks   Status 12.56 ;  Partially met - 12.1 secs 02-14-22   Target Date 02-17-22             LONG TERM GOALS: Target date: 01/06/2022   PT Long Term Goals - 11/09/21 1949       PT LONG TERM GOAL #1   Title Independent in updated  HEP for bil. strengthening and balance exercises.    Time 8    Period Weeks    Status Goal met 01-03-22   Target Date 01/06/22      PT LONG TERM GOAL #2   Title Improve 5x sit to stand score from 13.22 secs to </= 10 secs without UE support from mat.    Baseline 13.22 from mat; 12.56 secs    Time 8    Period Weeks    Status Goal met   Target Date 01/06/22      PT LONG TERM GOAL #3   Title Improve TUG score to </= 17 secs with SPC to demo improved mobility.    Baseline 20.37 secs with RW ; 18.81 secs with SPC   Time 8    Period Weeks    Status Goal not met 01-03-22     PT LONG TERM GOAL #4   Title Incr. gait velocity to >/= 2.0  ft/sec with SPC for incr. gait efficiency.    Baseline 1.55 ft/sec with RW ; 22.03 secs = 1.49 ft/sec   Time 8    Period Weeks    Status Goal not met 01-03-22   Target Date 01/06/22            NEW UPDATED LTG'S; TARGET DATE 03-17-22   PT Long Term Goals - 01/10/22 2014       PT LONG TERM GOAL #1   Title Pt will be modified independent with household amb. with SPC at least 75% time.    Time 8    Period Weeks    Status New    Target Date 03/17/22      PT LONG TERM GOAL #2   Title Improve 5x sit to stand score from 12.56 secs to </= 9 secs without UE support from mat.    Time 8    Period Weeks    Status New    Target Date 03/17/22      PT LONG TERM GOAL #3   Title Improve TUG score to </= 15 secs with SPC to demo improved mobility.    Time 8    Period Weeks    Status 18.81 secs with [facility]   Target Date 03/17/22      PT LONG TERM GOAL #4   Title Incr. gait velocity to >/= 2.0  ft/sec with SPC for incr. gait efficiency.    Time 8    Period 22.03 secs = 1.49 ft/sec with SPC   Status New      PT LONG TERM GOAL #5   Title Pt will report ability to step up onto  step stool at home to get into her bed for sleeping.    Time 8    Period Weeks    Status New   Target Date 03/17/22                ASSESSMENT:  CLINICAL  IMPRESSION: This 10th visit progress note covers dates 11-08-21 - 02-14-22:  Pt has partially met STG's #1,2 and 4; STG #3 not met as pt reports using SPC in home approx. 10% of the time (usually uses RW to be safe);  scores have improved with gait velocity with use of RW and TUG score with use of SPC, but not to stated goal level.  PT session focused on bil. LE strengthening and balance training.  Pt continues to need UE support for SLS balance activities/exercises.  Pt is progressing with improving strength in bil. LE's; lymphedema in LE's continues to impact flexibility and limit AROM. Cont with POC.     OBJECTIVE IMPAIRMENTS decreased balance, decreased endurance, difficulty walking, decreased ROM, decreased strength, increased edema, and pain.   ACTIVITY LIMITATIONS carrying, lifting, bending, standing, squatting, stairs, transfers, and locomotion level  PARTICIPATION LIMITATIONS: meal prep, cleaning, laundry, driving, and shopping  PERSONAL FACTORS Past/current experiences, Time since onset of injury/illness/exacerbation, and 1 comorbidity: lymphedema  are also affecting patient's functional outcome.   REHAB POTENTIAL:  Good  CLINICAL DECISION MAKING: Stable/uncomplicated  EVALUATION COMPLEXITY: Low  PLAN: PT FREQUENCY: 1x/week  PT DURATION: 8 weeks  PLANNED INTERVENTIONS: Therapeutic exercises, Therapeutic activity, Neuromuscular re-education, Balance training, Gait training, Patient/Family education, Self Care, Stair training, and DME instructions  PLAN FOR NEXT SESSION: Cont with LE strengthening, balance and gait training   Kamarri Lovvorn, [place], PT 02/21/2022, 9:35 AM

## 2022-02-22 ENCOUNTER — Encounter: Payer: Self-pay | Admitting: Physical Therapy

## 2022-02-28 ENCOUNTER — Ambulatory Visit: Payer: Medicare Other | Attending: Internal Medicine | Admitting: Physical Therapy

## 2022-02-28 DIAGNOSIS — M6281 Muscle weakness (generalized): Secondary | ICD-10-CM | POA: Insufficient documentation

## 2022-02-28 DIAGNOSIS — R2689 Other abnormalities of gait and mobility: Secondary | ICD-10-CM | POA: Insufficient documentation

## 2022-02-28 DIAGNOSIS — R2681 Unsteadiness on feet: Secondary | ICD-10-CM | POA: Insufficient documentation

## 2022-02-28 NOTE — Therapy (Signed)
OUTPATIENT PHYSICAL THERAPY NEURO TREATMENT NOTE       Patient Name: Jeanne Lawson MRN: [id number] DOB:09-17-1945, 77 y.o., female Today's Date: 03/01/2022   PCP: [name], MD  REFERRING PROVIDER: [name], MD    PT End of Session - 03/01/22 1410     Visit Number 12    Number of Visits 16    Date for PT Re-Evaluation 03/10/22    Authorization Type [employer] Medicare    Authorization Time Period 11-08-21 - 01-22-22;  01-10-22 - 03-23-22    PT Start Time 0936    PT Stop Time 1017    PT Time Calculation (min) 41 min    Activity Tolerance Patient tolerated treatment well    Behavior During Therapy [facility] for tasks assessed/performed                    Past Medical History:  Diagnosis Date   Acute on chronic diastolic CHF (congestive heart failure) ([place]) 04/16/2021   Cancer (HCC)    right leg skin    COPD (chronic obstructive pulmonary disease) ([place])    Coronary artery disease 1996   Known with prior mild lesion of LAD demonstrated by Cardiac Catheterization in 1996   Edema of foot    She has a history of chronic edema of the left dated back to age 22 when she suufered severe frostbite playing  on the snow as a child   Hypertension    Lower extremity edema    PAC (premature atrial contraction)    Pancreatitis    x2   PONV (postoperative nausea and vomiting)    problems waking up last time 4/16 only time   Saccular aneurysm    She also has 2 known which were stable between the MRA of October2010 and the MRA  of April 2011.   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 Has normal EF.   Past Surgical History:  Procedure Laterality Date    ABDOMINAL HYSTERECTOMY     APPENDECTOMY     CARDIAC CATHETERIZATION  1996   Mild CAD with vasospasm   CARDIOVASCULAR STRESS TEST  12-02-2001   EF 70%   CHOLECYSTECTOMY N/A 03/09/2016   Procedure: LAPAROSCOPIC CHOLECYSTECTOMY WITH INTRAOPERATIVE CHOLANGIOGRAM;  Surgeon: Georganna Skeans, MD;  Location: [place];  Service: General;  Laterality: N/A;   ENDARTERECTOMY Right 08/12/2014   Procedure: RIGHT  COMMON CAROTID ARTERY EXPOSURE FOR INTERVENTIONAL RADIOLOGY PROCEDURE BY DR.DEVASHWAR,Insertion 6 FR sheath;  Surgeon: Elam Dutch, MD;  Location: [place];  Service: Vascular;  Laterality: Right;   ENDARTERECTOMY Right 08/12/2014   Procedure:  CAROTID  EXPOSURE CLOSURE RIGHT NECK, REPAIR RIGHT COMMON CAROTID ARTERY;  Surgeon: Elam Dutch, MD;  Location: [place];  Service: Vascular;  Laterality: Right;   ENDARTERECTOMY Left 11/18/2014   Procedure: CAROTID EXPOSURE;  Surgeon: Elam Dutch, MD;  Location: [place];  Service: Vascular;  Laterality: Left;   ENDARTERECTOMY Left 11/18/2014   Procedure: CLOSURE CAROTID;  Surgeon: Elam Dutch, MD;  Location: [place];  Service: Vascular;  Laterality: Left;   ENDARTERECTOMY Right 08/22/2017   Procedure: EXPOSURE CAROTID ARTERY  FOR DEVESHWAR SURGERY;  Surgeon: Elam Dutch, MD;  Location: [facility] OR;  Service: Vascular;  Laterality: Right;   IR ANGIO INTRA EXTRACRAN SEL INTERNAL CAROTID UNI R MOD SED  08/22/2017   IR ANGIOGRAM FOLLOW UP STUDY  08/22/2017   IR RADIOLOGIST EVAL & MGMT  06/26/2016   IR RADIOLOGIST EVAL & MGMT  05/08/2017   IR RADIOLOGY PERIPHERAL GUIDED IV START  12/03/2017   IR TRANSCATH/EMBOLIZ  08/22/2017   IR US GUIDE VASC ACCESS RIGHT  12/03/2017   LAPAROSCOPY     NECK SURGERY  50 yrs ago   Left side tumor   RADIOLOGY WITH ANESTHESIA N/A 05/25/2014   Procedure: RADIOLOGY WITH ANESTHESIA;  Surgeon: [name], MD;  Location: [place];  Service: Radiology;  Laterality: N/A;   RADIOLOGY WITH ANESTHESIA N/A 08/12/2014   Procedure: RADIOLOGY  WITH ANESTHESIA;  Surgeon: [name], MD;  Location: [place];  Service: Radiology;  Laterality: N/A;   RADIOLOGY WITH ANESTHESIA N/A 03/15/2015   Procedure: MRI OF BRAIN WITHOUT CONTRAST;  Surgeon: Medication Radiologist, MD;  Location: [place];  Service: Radiology;  Laterality: N/A;  DR. TAT/MRI   US ECHOCARDIOGRAPHY  04-26-2009   EF 65-70%   VESICOVAGINAL FISTULA CLOSURE W/ TAH  25 yrs ago   WOUND EXPLORATION Right 08/22/2017   Procedure: REMOVAL OF RIGHT COMMON CAROTID SHEATH AND WOUND CLOSURE;  Surgeon: Rosetta Posner, MD;  Location: [facility] OR;  Service: Vascular;  Laterality: Right;   Patient Active Problem List   Diagnosis Date Noted   Acute on chronic diastolic (congestive) heart failure ([place]) 06/30/2021   History of CVA (cerebrovascular accident) 06/28/2021   Chronic diastolic CHF (congestive heart failure) ([place]) 06/28/2021   Lymphedema    Acute on chronic diastolic CHF (congestive heart failure) ([place]) 04/16/2021   COPD (chronic obstructive pulmonary disease) ([place])    Acute encephalopathy 04/15/2021   Edema of both lower extremities due to peripheral venous insufficiency 07/13/2020   Acute cholecystitis 03/07/2016   Small vessel disease, cerebrovascular 05/31/2015   Aneurysm, cerebral, nonruptured 05/31/2015   Dizziness and giddiness 05/31/2015   Altered mental status    Acute CVA (cerebrovascular accident) ([place]) 03/15/2015   Dysphasia 03/12/2015   Thrombocytopenia ([place]) 03/12/2015   Hypokalemia    Endotracheally intubated    Essential hypertension    Intracranial aneurysm 11/18/2014   Respiratory failure (HCC)    Cerebral aneurysm    Brain aneurysm    Aphasia    Palmar erythema    CVA (cerebral infarction) 05/02/2014   Aneurysm ([place]) 05/02/2014   Expressive aphasia 05/02/2014   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   Tobacco abuse 10/24/2010   TIA (transient ischemic attack)    Hypertension    Ventricular hypertrophy    CVA  02/23/2010   STRESS FRACTURE, FOOT 02/22/2010    ONSET DATE: March 2023:  Referral date 10-28-21  REFERRING DIAG: R26.0 (ICD-10-CM) - Ataxic gait I67.1 (ICD-10-CM) - Cerebral aneurysm, nonruptured   THERAPY DIAG:  Muscle weakness (generalized)  Other abnormalities of gait and mobility  Unsteadiness on feet  Rationale for Evaluation and Treatment Rehabilitation  SUBJECTIVE:  SUBJECTIVE STATEMENT:  Pt reports no problems since previous PT session last week; continues to use RW for assistance with community ambulation; pt reports she cleaned her apartment yesterday - states her back was a sore at end of the day  Pt accompanied by: self  PERTINENT HISTORY:  BRIGID VANDEKAMP is a 77 y.o. female with medical history significant of chronic diastolic CHF, CVA, chronic severe lymphedema, COPD and hypertension who presented with worsening swelling of the lower extremity.   PAIN:    Are you having pain? Yes: NPRS scale: 3/10 Pain location: low back and Rt knee Pain description: soreness Aggravating factors: weight bearing Relieving factors: non weight bearing positions Pt reports constant pain in low back and Rt knee;    Back pain varies   PRECAUTIONS: None  WEIGHT BEARING RESTRICTIONS No  FALLS: Has patient fallen in last 6 months? No  LIVING ENVIRONMENT: Lives with: lives alone and lives in an assisted living facility Lives in: Other assisted living facility Has following equipment at home: Single point cane, Environmental consultant - 2 wheeled, Electronics engineer, and Grab bars  PLOF: Independent with basic ADLs, Independent with household mobility with device, and Independent with transfers  PATIENT GOALS "to walk with my cane"   Today's Treatment:   02-28-22  Gait:  Pt gait trained 100' with no device with  CGA on flat, even surface    TherEx: Heel raises 10 reps bil. LE's  Unilateral heel raise each leg with bil. UE support on // bars 10 reps RLE and 5 reps LLE  Step ups RLE and LLE 10 reps with UE support on hand rail - RLE 10 reps onto 8" step:  LLE 10 reps onto 8" step with bil. UE support on // ba  Sit to stand  - 5x - from standard chair- min. UE support used  Sidestepping 10' x 2 reps with squats inside // bars with bil. UE support on // bars   NeuroRe-ed: Tap ups to 6" step with CGA - 5 reps each LE- with UE support on // bars prn; 5 reps each leg to 2nd step;  5 reps to 3rd step with each leg with bil. UE support  Stepping over and back of balance beam 10 reps each leg with UE support on // bars; cues to lift LLE high for improved clearance  Marching in place 10 reps without UE support (performed inside // bars)  Rockerboard inside // bars - 10 reps ant./posteriorly with bil. UE support on // bars and then 10 reps anterior/posteriorly with RUE support only with CGA  Pt performed SLS activity - touching 3 colored discs with each foot 5x each with LUE support on counter as needed  Cone taps to 2 cones with CGA to min HHA to improve SLS on each leg   PATIENT EDUCATION: Education details: sit to stand for functional strengthening Person educated: Patient Education method: Explanation Education comprehension: verbalized understanding   HOME EXERCISE PROGRAM: To be established    GOALS: Goals reviewed with patient? Yes  SHORT TERM GOALS: Target date: 02-17-22   PT Short Term Goals -        PT SHORT TERM GOAL #1   Title Increase gait velocity to >/= 2.2 ft/sec with RW for incr. gait efficiency.    Time 4    Period Weeks    Status 17.97 secs; 1.83 ft/sec; 15.6 secs =Partially met = 2.10 ft/sec    Target Date 02-17-22     PT SHORT TERM  GOAL #2   Title Pt will improve TUG score to </= 15 secs with SPC to demo improved functional mobility.    Time 4    Period Weeks     Status  17.09 secs with RW; 16.7 secs with SPC;  Partially met    Target Date 02-17-22     PT SHORT TERM GOAL #3   Title Pt will be modified independent with household amb. With use of SPC at least 50% time.   Time 4    Period Weeks    Status  Not met - pt reports using cane approx. 10% of time   Target Date 02-17-22     PT SHORT TERM GOAL #4   Title Pt will perform 5x sit to stand transfers from mat without UE support in </= 11 secs to demo improved LE strength.    Time 4   Period weeks   Status 12.56 ;  Partially met - 12.1 secs 02-14-22   Target Date 02-17-22             LONG TERM GOALS: Target date: 01/06/2022   PT Long Term Goals - 11/09/21 1949       PT LONG TERM GOAL #1   Title Independent in updated HEP for bil. strengthening and balance exercises.    Time 8    Period Weeks    Status Goal met 01-03-22   Target Date 01/06/22      PT LONG TERM GOAL #2   Title Improve 5x sit to stand score from 13.22 secs to </= 10 secs without UE support from mat.    Baseline 13.22 from mat; 12.56 secs    Time 8    Period Weeks    Status Goal met   Target Date 01/06/22      PT LONG TERM GOAL #3   Title Improve TUG score to </= 17 secs with SPC to demo improved mobility.    Baseline 20.37 secs with RW ; 18.81 secs with SPC   Time 8    Period Weeks    Status Goal not met 01-03-22     PT LONG TERM GOAL #4   Title Incr. gait velocity to >/= 2.0  ft/sec with SPC for incr. gait efficiency.    Baseline 1.55 ft/sec with RW ; 22.03 secs = 1.49 ft/sec   Time 8    Period Weeks    Status Goal not met 01-03-22   Target Date 01/06/22            NEW UPDATED LTG'S; TARGET DATE 03-17-22   PT Long Term Goals - 01/10/22 2014       PT LONG TERM GOAL #1   Title Pt will be modified independent with household amb. with SPC at least 75% time.    Time 8    Period Weeks    Status New    Target Date 03/17/22      PT LONG TERM GOAL #2   Title Improve 5x sit to stand score from 12.56  secs to </= 9 secs without UE support from mat.    Time 8    Period Weeks    Status New    Target Date 03/17/22      PT LONG TERM GOAL #3   Title Improve TUG score to </= 15 secs with SPC to demo improved mobility.    Time 8    Period Weeks    Status 18.81 secs with [facility]   Target Date  03/17/22      PT LONG TERM GOAL #4   Title Incr. gait velocity to >/= 2.0  ft/sec with SPC for incr. gait efficiency.    Time 8    Period 22.03 secs = 1.49 ft/sec with SPC   Status New      PT LONG TERM GOAL #5   Title Pt will report ability to step up onto step stool at home to get into her bed for sleeping.    Time 8    Period Weeks    Status New   Target Date 03/17/22                ASSESSMENT:  CLINICAL IMPRESSION:  PT session focused on bil. LE strengthening and balance training to improve SLS on each leg.  Pt amb. Without device approx. 100' in today's session with CGA.  LLE remains weaker than RLE due to h/o Rt CVA.  Pt is progressing well.  Cont with POC.     OBJECTIVE IMPAIRMENTS decreased balance, decreased endurance, difficulty walking, decreased ROM, decreased strength, increased edema, and pain.   ACTIVITY LIMITATIONS carrying, lifting, bending, standing, squatting, stairs, transfers, and locomotion level  PARTICIPATION LIMITATIONS: meal prep, cleaning, laundry, driving, and shopping  PERSONAL FACTORS Past/current experiences, Time since onset of injury/illness/exacerbation, and 1 comorbidity: lymphedema  are also affecting patient's functional outcome.   REHAB POTENTIAL:  Good  CLINICAL DECISION MAKING: Stable/uncomplicated  EVALUATION COMPLEXITY: Low  PLAN: PT FREQUENCY: 1x/week  PT DURATION: 8 weeks  PLANNED INTERVENTIONS: Therapeutic exercises, Therapeutic activity, Neuromuscular re-education, Balance training, Gait training, Patient/Family education, Self Care, Stair training, and DME instructions  PLAN FOR NEXT SESSION: Cont with LE strengthening, balance  and gait training   Amore Grater, [place], PT 03/01/2022, 2:22 PM

## 2022-03-01 ENCOUNTER — Encounter: Payer: Self-pay | Admitting: Physical Therapy

## 2022-03-07 ENCOUNTER — Ambulatory Visit: Payer: Medicare Other | Admitting: Physical Therapy

## 2022-03-07 DIAGNOSIS — M6281 Muscle weakness (generalized): Secondary | ICD-10-CM | POA: Diagnosis not present

## 2022-03-07 DIAGNOSIS — R2681 Unsteadiness on feet: Secondary | ICD-10-CM

## 2022-03-07 DIAGNOSIS — R2689 Other abnormalities of gait and mobility: Secondary | ICD-10-CM

## 2022-03-07 NOTE — Therapy (Signed)
OUTPATIENT PHYSICAL THERAPY NEURO TREATMENT NOTE       Patient Name: Jeanne Lawson MRN: [id number] DOB:04/12/1945, 77 y.o., female Today's Date: 03/08/2022   PCP: [name], MD  REFERRING PROVIDER: [name], MD    PT End of Session - 03/08/22 1111     Visit Number 13    Number of Visits 16    Date for PT Re-Evaluation 03/10/22    Authorization Type [employer] Medicare    Authorization Time Period 11-08-21 - 01-22-22;  01-10-22 - 03-23-22    PT Start Time 0933    PT Stop Time 1015    PT Time Calculation (min) 42 min    Activity Tolerance Patient tolerated treatment well    Behavior During Therapy [facility] for tasks assessed/performed                     Past Medical History:  Diagnosis Date   Acute on chronic diastolic CHF (congestive heart failure) ([place]) 04/16/2021   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    COPD (chronic obstructive pulmonary disease) ([place])    Coronary artery disease 1996   Known with prior mild lesion of LAD demonstrated by Cardiac Catheterization in 1996   Edema of foot    She has a history of chronic edema of the left dated back to age 29 when she suufered severe frostbite playing  on the snow as a child   Hypertension    Lower extremity edema    PAC (premature atrial contraction)    Pancreatitis    x2   PONV (postoperative nausea and vomiting)    problems waking up last time 4/16 only time   Saccular aneurysm    She also has 2 known which were stable between the MRA of October2010 and the MRA  of April 2011.   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 Has normal EF.   Past Surgical History:  Procedure Laterality Date    ABDOMINAL HYSTERECTOMY     APPENDECTOMY     CARDIAC CATHETERIZATION  1996   Mild CAD with vasospasm   CARDIOVASCULAR STRESS TEST  12-02-2001   EF 70%   CHOLECYSTECTOMY N/A 03/09/2016   Procedure: LAPAROSCOPIC CHOLECYSTECTOMY WITH INTRAOPERATIVE CHOLANGIOGRAM;  Surgeon: Georganna Skeans, MD;  Location: [place];  Service: General;  Laterality: N/A;   ENDARTERECTOMY Right 08/12/2014   Procedure: RIGHT  COMMON CAROTID ARTERY EXPOSURE FOR INTERVENTIONAL RADIOLOGY PROCEDURE BY DR.DEVASHWAR,Insertion 6 FR sheath;  Surgeon: Elam Dutch, MD;  Location: [place];  Service: Vascular;  Laterality: Right;   ENDARTERECTOMY Right 08/12/2014   Procedure:  CAROTID  EXPOSURE CLOSURE RIGHT NECK, REPAIR RIGHT COMMON CAROTID ARTERY;  Surgeon: Elam Dutch, MD;  Location: [place];  Service: Vascular;  Laterality: Right;   ENDARTERECTOMY Left 11/18/2014   Procedure: CAROTID EXPOSURE;  Surgeon: Elam Dutch, MD;  Location: [place];  Service: Vascular;  Laterality: Left;   ENDARTERECTOMY Left 11/18/2014   Procedure: CLOSURE CAROTID;  Surgeon: Elam Dutch, MD;  Location: [place];  Service: Vascular;  Laterality: Left;   ENDARTERECTOMY Right 08/22/2017   Procedure: EXPOSURE CAROTID  ARTERY FOR [place] SURGERY;  Surgeon: Elam Dutch, MD;  Location: [facility];  Service: Vascular;  Laterality: Right;   IR ANGIO INTRA EXTRACRAN SEL INTERNAL CAROTID UNI R MOD SED  08/22/2017   IR ANGIOGRAM FOLLOW UP STUDY  08/22/2017   IR RADIOLOGIST EVAL & MGMT  06/26/2016   IR RADIOLOGIST EVAL & MGMT  05/08/2017   IR RADIOLOGY PERIPHERAL GUIDED IV START  12/03/2017   IR TRANSCATH/EMBOLIZ  08/22/2017   IR US GUIDE VASC ACCESS RIGHT  12/03/2017   LAPAROSCOPY     NECK SURGERY  50 yrs ago   Left side tumor   RADIOLOGY WITH ANESTHESIA N/A 05/25/2014   Procedure: RADIOLOGY WITH ANESTHESIA;  Surgeon: [name], MD;  Location: [place];  Service: Radiology;  Laterality: N/A;   RADIOLOGY WITH ANESTHESIA N/A 08/12/2014   Procedure:  RADIOLOGY WITH ANESTHESIA;  Surgeon: [name], MD;  Location: [place];  Service: Radiology;  Laterality: N/A;   RADIOLOGY WITH ANESTHESIA N/A 03/15/2015   Procedure: MRI OF BRAIN WITHOUT CONTRAST;  Surgeon: Medication Radiologist, MD;  Location: [place];  Service: Radiology;  Laterality: N/A;  DR. TAT/MRI   US ECHOCARDIOGRAPHY  04-26-2009   EF 65-70%   VESICOVAGINAL FISTULA CLOSURE W/ TAH  25 yrs ago   WOUND EXPLORATION Right 08/22/2017   Procedure: REMOVAL OF RIGHT COMMON CAROTID SHEATH AND WOUND CLOSURE;  Surgeon: Rosetta Posner, MD;  Location: [facility] OR;  Service: Vascular;  Laterality: Right;   Patient Active Problem List   Diagnosis Date Noted   Acute on chronic diastolic (congestive) heart failure ([place]) 06/30/2021   History of CVA (cerebrovascular accident) 06/28/2021   Chronic diastolic CHF (congestive heart failure) ([place]) 06/28/2021   Lymphedema    Acute on chronic diastolic CHF (congestive heart failure) ([place]) 04/16/2021   COPD (chronic obstructive pulmonary disease) ([place])    Acute encephalopathy 04/15/2021   Edema of both lower extremities due to peripheral venous insufficiency 07/13/2020   Acute cholecystitis 03/07/2016   Small vessel disease, cerebrovascular 05/31/2015   Aneurysm, cerebral, nonruptured 05/31/2015   Dizziness and giddiness 05/31/2015   Altered mental status    Acute CVA (cerebrovascular accident) ([place]) 03/15/2015   Dysphasia 03/12/2015   Thrombocytopenia ([place]) 03/12/2015   Hypokalemia    Endotracheally intubated    Essential hypertension    Intracranial aneurysm 11/18/2014   Respiratory failure ([facility])    Cerebral aneurysm    Brain aneurysm    Aphasia    Palmar erythema    CVA (cerebral infarction) 05/02/2014   Aneurysm ([place]) 05/02/2014   Expressive aphasia 05/02/2014   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   Tobacco abuse 10/24/2010   TIA (transient ischemic attack)    Hypertension    Ventricular hypertrophy    CVA  02/23/2010   STRESS FRACTURE, FOOT 02/22/2010    ONSET DATE: March 2023:  Referral date 10-28-21  REFERRING DIAG: R26.0 (ICD-10-CM) - Ataxic gait I67.1 (ICD-10-CM) - Cerebral aneurysm, nonruptured   THERAPY DIAG:  Muscle weakness (generalized)  Other abnormalities of gait and mobility  Unsteadiness on feet  Rationale for Evaluation and Treatment Rehabilitation  SUBJECTIVE:  SUBJECTIVE STATEMENT:  Pt reports she is moving a little slower today - reports she still uses RW most of the time in her home for assistance with walking to be safe, as she does not want to risk falling; states she has not been using cane very much  Pt accompanied by: self  PERTINENT HISTORY:  KASHYRA BONTEMPO is a 77 y.o. female with medical history significant of chronic diastolic CHF, CVA, chronic severe lymphedema, COPD and hypertension who presented with worsening swelling of the lower extremity.   PAIN:    Are you having pain? Yes: NPRS scale: 2-3/10 Pain location: headache  Pain description: headache Aggravating factors: weight bearing Relieving factors: non weight bearing positions Pt reports constant pain in low back and Rt knee;    Back pain varies   PRECAUTIONS: None  WEIGHT BEARING RESTRICTIONS No  FALLS: Has patient fallen in last 6 months? No  LIVING ENVIRONMENT: Lives with: lives alone and lives in an assisted living facility Lives in: Other assisted living facility Has following equipment at home: Single point cane, Environmental consultant - 2 wheeled, Electronics engineer, and Grab bars  PLOF: Independent with basic ADLs, Independent with household mobility with device, and Independent with transfers  PATIENT GOALS "to walk with my cane"   Today's Treatment:   03-07-22  Gait:  Pt gait trained 105' with SPC with CGA  on flat, even surface   Gait velocity = 30.97 secs with SPC = 1.06 ft/sec  TherEx: Heel raises 10 reps bil. LE's  Unilateral heel raise each leg with bil. UE support on // bars 10 reps RLE and  LLE  Step ups RLE and LLE 10 reps with UE support on hand rail - RLE 10 reps onto 8" step:  LLE 10 reps onto 8" step with bil. UE support on // ba  5x Sit to stand from mat table:  11.56 secs 1st trial, 9.41 secs 2nd trial    NeuroRe-ed: Tap ups to 6" step with CGA - 5 reps each LE- with UE support on // bars prn; 5 reps each leg to 2nd step;  5 reps to 3rd step with each leg with bil. UE support  TUG = 21.25 secs with [facility]    PATIENT EDUCATION: Education details: sit to stand for functional strengthening Person educated: Patient Education method: Explanation Education comprehension: verbalized understanding   HOME EXERCISE PROGRAM: To be established    GOALS: Goals reviewed with patient? Yes  SHORT TERM GOALS: Target date: 02-17-22   PT Short Term Goals -        PT SHORT TERM GOAL #1   Title Increase gait velocity to >/= 2.2 ft/sec with RW for incr. gait efficiency.    Time 4    Period Weeks    Status 17.97 secs; 1.83 ft/sec; 15.6 secs =Partially met = 2.10 ft/sec    Target Date 02-17-22     PT SHORT TERM GOAL #2   Title Pt will improve TUG score to </= 15 secs with SPC to demo improved functional mobility.    Time 4    Period Weeks    Status  17.09 secs with RW; 16.7 secs with SPC;  Partially met    Target Date 02-17-22     PT SHORT TERM GOAL #3   Title Pt will be modified independent with household amb. With use of SPC at least 50% time.   Time 4    Period Weeks    Status  Not met - pt  reports using cane approx. 10% of time   Target Date 02-17-22     PT SHORT TERM GOAL #4   Title Pt will perform 5x sit to stand transfers from mat without UE support in </= 11 secs to demo improved LE strength.    Time 4   Period weeks   Status 12.56 ;  Partially met - 12.1 secs  02-14-22   Target Date 02-17-22             LONG TERM GOALS: Target date: 01/06/2022   PT Long Term Goals - 11/09/21 1949       PT LONG TERM GOAL #1   Title Independent in updated HEP for bil. strengthening and balance exercises.    Time 8    Period Weeks    Status Goal met 01-03-22   Target Date 01/06/22      PT LONG TERM GOAL #2   Title Improve 5x sit to stand score from 13.22 secs to </= 10 secs without UE support from mat.    Baseline 13.22 from mat; 12.56 secs    Time 8    Period Weeks    Status Goal met   Target Date 01/06/22      PT LONG TERM GOAL #3   Title Improve TUG score to </= 17 secs with SPC to demo improved mobility.    Baseline 20.37 secs with RW ; 18.81 secs with SPC   Time 8    Period Weeks    Status Goal not met 01-03-22     PT LONG TERM GOAL #4   Title Incr. gait velocity to >/= 2.0  ft/sec with SPC for incr. gait efficiency.    Baseline 1.55 ft/sec with RW ; 22.03 secs = 1.49 ft/sec   Time 8    Period Weeks    Status Goal not met 01-03-22   Target Date 01/06/22            NEW UPDATED LTG'S; TARGET DATE 03-17-22   PT Long Term Goals - 01/10/22 2014       PT LONG TERM GOAL #1   Title Pt will be modified independent with household amb. with SPC at least 75% time.    Time 8    Period Weeks    Status New    Target Date 03/17/22      PT LONG TERM GOAL #2   Title Improve 5x sit to stand score from 12.56 secs to </= 9 secs without UE support from mat.    Time 8    Period Weeks    Status New;    11.56 secs, 9.41 secs   Target Date 03/17/22      PT LONG TERM GOAL #3   Title Improve TUG score to </= 15 secs with SPC to demo improved mobility.    Time 8    Period Weeks    Status 18.81 secs with SPC; 03-06-22 = 21.25   Target Date 03/17/22      PT LONG TERM GOAL #4   Title Incr. gait velocity to >/= 2.0  ft/sec with SPC for incr. gait efficiency.    Time 8    Period 22.03 secs = 1.49 ft/sec with SPC:  30.97 = 1.06 ft/sec with SPC     Status New      PT LONG TERM GOAL #5   Title Pt will report ability to step up onto step stool at home to get into her bed for sleeping.  Time 8    Period Weeks    Status New   Target Date 03/17/22                ASSESSMENT:  CLINICAL IMPRESSION:  Today's PT session focused on assessment of LTG's in preparation for renewal vs. D/C.  Pt continues to ambulate with RW in home and community and reports she has not been using [place] for household amb. Due to not wanting to risk falling.  5x sit to stand score improved from 12.56 secs to 9.41 secs on the 2nd trial.  Goal partially met as goal set at </= 9 secs. LTG # 3 not met as TUG score decreased from 18.81 secs to 21. 25 secs with use of SPC;  pt reported not feeling as well today due to having upset stomach at end of session and also attributed slower speed with SPC due to not having practiced with this device in her home recently.  Will reassess LTG's next week with pt feeling better.  Cont with POC.     OBJECTIVE IMPAIRMENTS decreased balance, decreased endurance, difficulty walking, decreased ROM, decreased strength, increased edema, and pain.   ACTIVITY LIMITATIONS carrying, lifting, bending, standing, squatting, stairs, transfers, and locomotion level  PARTICIPATION LIMITATIONS: meal prep, cleaning, laundry, driving, and shopping  PERSONAL FACTORS Past/current experiences, Time since onset of injury/illness/exacerbation, and 1 comorbidity: lymphedema  are also affecting patient's functional outcome.   REHAB POTENTIAL:  Good  CLINICAL DECISION MAKING: Stable/uncomplicated  EVALUATION COMPLEXITY: Low  PLAN: PT FREQUENCY: 1x/week  PT DURATION: 8 weeks  PLANNED INTERVENTIONS: Therapeutic exercises, Therapeutic activity, Neuromuscular re-education, Balance training, Gait training, Patient/Family education, Self Care, Stair training, and DME instructions  PLAN FOR NEXT SESSION: Cont with LE strengthening, balance and gait  training   Ricco Dershem, [place], PT 03/08/2022, 11:15 AM

## 2022-03-08 ENCOUNTER — Encounter: Payer: Self-pay | Admitting: Physical Therapy

## 2022-03-14 ENCOUNTER — Ambulatory Visit: Payer: Medicare Other | Admitting: Physical Therapy

## 2022-03-14 DIAGNOSIS — R2689 Other abnormalities of gait and mobility: Secondary | ICD-10-CM

## 2022-03-14 DIAGNOSIS — M6281 Muscle weakness (generalized): Secondary | ICD-10-CM

## 2022-03-14 DIAGNOSIS — R2681 Unsteadiness on feet: Secondary | ICD-10-CM

## 2022-03-14 NOTE — Therapy (Signed)
OUTPATIENT PHYSICAL THERAPY NEURO TREATMENT NOTE       Patient Name: Jeanne Lawson MRN: [id number] DOB:Oct 12, 1945, 77 y.o., female Today's Date: 03/15/2022   PCP: [name], MD  REFERRING PROVIDER: [name], MD    PT End of Session - 03/15/22 1827     Visit Number 14    Number of Visits 18    Date for PT Re-Evaluation 05/02/22    Authorization Type [employer]    Authorization Time Period 11-08-21 - 01-22-22;  01-10-22 - 03-23-22;  03-14-22 - 05-23-22    PT Start Time 0932    PT Stop Time 1018    PT Time Calculation (min) 46 min    Activity Tolerance Patient tolerated treatment well    Behavior During Therapy [facility] for tasks assessed/performed                      Past Medical History:  Diagnosis Date   Acute on chronic diastolic CHF (congestive heart failure) ([place]) 04/16/2021   Cancer (HCC)    right leg skin    COPD (chronic obstructive pulmonary disease) ([place])    Coronary artery disease 1996   Known with prior mild lesion of LAD demonstrated by Cardiac Catheterization in 1996   Edema of foot    She has a history of chronic edema of the left dated back to age 64 when she suufered severe frostbite playing  on the snow as a child   Hypertension    Lower extremity edema    PAC (premature atrial contraction)    Pancreatitis    x2   PONV (postoperative nausea and vomiting)    problems waking up last time 4/16 only time   Saccular aneurysm    She also has 2 known which were stable between the MRA of October2010 and the MRA  of April 2011.   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 Has normal EF.   Past Surgical History:   Procedure Laterality Date   ABDOMINAL HYSTERECTOMY     APPENDECTOMY     CARDIAC CATHETERIZATION  1996   Mild CAD with vasospasm   CARDIOVASCULAR STRESS TEST  12-02-2001   EF 70%   CHOLECYSTECTOMY N/A 03/09/2016   Procedure: LAPAROSCOPIC CHOLECYSTECTOMY WITH INTRAOPERATIVE CHOLANGIOGRAM;  Surgeon: Georganna Skeans, MD;  Location: [place];  Service: General;  Laterality: N/A;   ENDARTERECTOMY Right 08/12/2014   Procedure: RIGHT  COMMON CAROTID ARTERY EXPOSURE FOR INTERVENTIONAL RADIOLOGY PROCEDURE BY DR.DEVASHWAR,Insertion 6 FR sheath;  Surgeon: Elam Dutch, MD;  Location: [place];  Service: Vascular;  Laterality: Right;   ENDARTERECTOMY Right 08/12/2014   Procedure:  CAROTID  EXPOSURE CLOSURE RIGHT NECK, REPAIR RIGHT COMMON CAROTID ARTERY;  Surgeon: Elam Dutch, MD;  Location: [place];  Service: Vascular;  Laterality: Right;   ENDARTERECTOMY Left 11/18/2014   Procedure: CAROTID EXPOSURE;  Surgeon: Elam Dutch, MD;  Location: [place];  Service: Vascular;  Laterality: Left;   ENDARTERECTOMY Left 11/18/2014   Procedure: CLOSURE CAROTID;  Surgeon: Elam Dutch, MD;  Location: [place];  Service: Vascular;  Laterality: Left;   ENDARTERECTOMY Right 08/22/2017  Procedure: EXPOSURE CAROTID ARTERY FOR [place] SURGERY;  Surgeon: Elam Dutch, MD;  Location: [facility] OR;  Service: Vascular;  Laterality: Right;   IR ANGIO INTRA EXTRACRAN SEL INTERNAL CAROTID UNI R MOD SED  08/22/2017   IR ANGIOGRAM FOLLOW UP STUDY  08/22/2017   IR RADIOLOGIST EVAL & MGMT  06/26/2016   IR RADIOLOGIST EVAL & MGMT  05/08/2017   IR RADIOLOGY PERIPHERAL GUIDED IV START  12/03/2017   IR TRANSCATH/EMBOLIZ  08/22/2017   IR US GUIDE VASC ACCESS RIGHT  12/03/2017   LAPAROSCOPY     NECK SURGERY  50 yrs ago   Left side tumor   RADIOLOGY WITH ANESTHESIA N/A 05/25/2014   Procedure: RADIOLOGY WITH ANESTHESIA;  Surgeon: [name], MD;  Location: [place];  Service: Radiology;  Laterality: N/A;   RADIOLOGY WITH ANESTHESIA N/A  08/12/2014   Procedure: RADIOLOGY WITH ANESTHESIA;  Surgeon: [name], MD;  Location: [place];  Service: Radiology;  Laterality: N/A;   RADIOLOGY WITH ANESTHESIA N/A 03/15/2015   Procedure: MRI OF BRAIN WITHOUT CONTRAST;  Surgeon: Medication Radiologist, MD;  Location: [place];  Service: Radiology;  Laterality: N/A;  DR. TAT/MRI   US ECHOCARDIOGRAPHY  04-26-2009   EF 65-70%   VESICOVAGINAL FISTULA CLOSURE W/ TAH  25 yrs ago   WOUND EXPLORATION Right 08/22/2017   Procedure: REMOVAL OF RIGHT COMMON CAROTID SHEATH AND WOUND CLOSURE;  Surgeon: Rosetta Posner, MD;  Location: [facility] OR;  Service: Vascular;  Laterality: Right;   Patient Active Problem List   Diagnosis Date Noted   Acute on chronic diastolic (congestive) heart failure ([place]) 06/30/2021   History of CVA (cerebrovascular accident) 06/28/2021   Chronic diastolic CHF (congestive heart failure) ([place]) 06/28/2021   Lymphedema    Acute on chronic diastolic CHF (congestive heart failure) ([place]) 04/16/2021   COPD (chronic obstructive pulmonary disease) ([place])    Acute encephalopathy 04/15/2021   Edema of both lower extremities due to peripheral venous insufficiency 07/13/2020   Acute cholecystitis 03/07/2016   Small vessel disease, cerebrovascular 05/31/2015   Aneurysm, cerebral, nonruptured 05/31/2015   Dizziness and giddiness 05/31/2015   Altered mental status    Acute CVA (cerebrovascular accident) ([place]) 03/15/2015   Dysphasia 03/12/2015   Thrombocytopenia ([place]) 03/12/2015   Hypokalemia    Endotracheally intubated    Essential hypertension    Intracranial aneurysm 11/18/2014   Respiratory failure (HCC)    Cerebral aneurysm    Brain aneurysm    Aphasia    Palmar erythema    CVA (cerebral infarction) 05/02/2014   Aneurysm ([place]) 05/02/2014   Expressive aphasia 05/02/2014   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   Tobacco abuse 10/24/2010   TIA (transient ischemic attack)    Hypertension     Ventricular hypertrophy    CVA 02/23/2010   STRESS FRACTURE, FOOT 02/22/2010    ONSET DATE: March 2023:  Referral date 10-28-21  REFERRING DIAG: R26.0 (ICD-10-CM) - Ataxic gait I67.1 (ICD-10-CM) - Cerebral aneurysm, nonruptured   THERAPY DIAG:  Muscle weakness (generalized)  Other abnormalities of gait and mobility  Unsteadiness on feet  Rationale for Evaluation and Treatment Rehabilitation  SUBJECTIVE:  SUBJECTIVE STATEMENT:  Pt reports she was getting sick during PT session last week but didn't realize it, says "something happened to me"; says when she got home she rested and felt "out of it"; did not feel well for 3-4 days - thinks she contracted a virus or something;  states that is why she did not do well with walking and other activities in previous PT session last week.  Pt reports she would like to continue PT for a few more sessions but wants to take a break for several weeks due to other medical appts scheduled in upcoming weeks  Pt accompanied by: self  PERTINENT HISTORY:  MYSTY MORSEY is a 77 y.o. female with medical history significant of chronic diastolic CHF, CVA, chronic severe lymphedema, COPD and hypertension who presented with worsening swelling of the lower extremity.   PAIN:    Are you having pain? Yes: NPRS scale: 2-3/10 Pain location: headache  Pain description: headache Aggravating factors: weight bearing Relieving factors: non weight bearing positions Pt reports constant pain in low back and Rt knee;    Back pain varies   PRECAUTIONS: None  WEIGHT BEARING RESTRICTIONS No  FALLS: Has patient fallen in last 6 months? No  LIVING ENVIRONMENT: Lives with: lives alone and lives in an assisted living facility Lives in: Other assisted living facility Has  following equipment at home: Single point cane, Environmental consultant - 2 wheeled, Electronics engineer, and Grab bars  PLOF: Independent with basic ADLs, Independent with household mobility with device, and Independent with transfers  PATIENT GOALS "to walk with my cane"   Today's Treatment:   03-14-22  Gait:  Pt gait trained 115' with SPC with CGA to SBA on flat, even surface   Gait velocity = 21.16 secs with SPC = 1.55 ft/sec with SBA  Ramp negotiation with SPC with CGA to SBA with ascending & descending Curb negotiation with CGA with cues for sequence   TherEx:  5x sit to stand = 9.94 secs without UE support from mat table  SciFit level 3.0 x 5" with Ue's and LE's for strengthening and endurance training  Step ups RLE and LLE 10 reps with UE support on hand rail - RLE 10 reps onto 8" step:  LLE 10 reps onto 8" step with bil. UE support on // b    NeuroRe-ed: Tap ups to 6" step with CGA - 5 reps each LE- with UE support on // bars prn; 5 reps each leg to 2nd step;  5 reps to 3rd step with each leg with bil. UE support  TUG = 17.91 secs with SPC Rockerboard inside // bars;  10 reps with bil. UE support;  10 reps with RUE support only; 10 reps without any UE support with CGA  Pt performed stepping over and back of balance beam 5 reps each leg inside // bars with UE support   PATIENT EDUCATION: Education details: discussed renewal for 4 additional sessions - pt requests continuation  Person educated: Patient Education method: Explanation Education comprehension: verbalized understanding   HOME EXERCISE PROGRAM: To be established    GOALS: Goals reviewed with patient? Yes         LONG TERM GOALS: Target date: 01/06/2022       UPDATED LTG'S; TARGET DATE 03-17-22   PT Long Term Goals - 01/10/22 2014       PT LONG TERM GOAL #1   Title Pt will be modified independent with household amb. with SPC at least 75% time.  Time 8    Period Weeks    Status Not met:  Possibly 10% of the  time   Target Date 03/17/22      PT LONG TERM GOAL #2   Title Improve 5x sit to stand score from 12.56 secs to </= 9 secs without UE support from mat.    Time 8    Period Weeks    Status Partially met:   11.56 secs, 9.41 secs;  03-14-22: 9.94 secs   Target Date 03/17/22      PT LONG TERM GOAL #3   Title Improve TUG score to </= 17 secs with SPC to demo improved mobility.   20.37 secs with RW ; 18.81 secs with SPC 03-14-22:  17.91 secs  8   Weeks      Time    Period    Status Not met   Target Date 03/17/22      PT LONG TERM GOAL #4   Title Incr. gait velocity to >/= 2.0  ft/sec with SPC for incr. gait efficiency.    Time 8    Period 22.03 secs = 1.49 ft/sec with SPC:  30.97 = 1.06 ft/sec with SPC ;  21.16 secs with SPC   Status Partially met 03-14-22     PT LONG TERM GOAL #5   Title Pt will report ability to step up onto step stool at home to get into her bed for sleeping.    Time 8    Period Weeks    Status Ongoing   Target Date 03/17/22                 NEW UPDATED LTG'S; TARGET DATE 05-02-22 (pt wishes to hold PT until 04-11-22 due to other medical appts);    PT Long Term Goals - established 03-14-22      PT LONG TERM GOAL   Title Pt will be modified independent with household amb. with SPC at least 75% time.    Time 8    Period Weeks    Status Not met:  Possibly 10% of the time   Target Date 05-02-22     PT LONG TERM GOAL #2   Title Improve 5x sit to stand score from 12.56 secs to </= 8 secs without UE support from mat.    Time 8    Period Weeks    Status REVISED:   11.56 secs, 9.41 secs;  03-14-22: 9.94 secs   Target Date 05-02-22     PT LONG TERM GOAL #3   Title Improve TUG score to </= 14 secs with RW to demo improved mobility.   20.37 secs with RW ; 18.81 secs with SPC 03-14-22:  17.91 secs  8   Weeks      Time    Period    Status REVISED to use of RW   Target Date 05-02-22     PT LONG TERM GOAL #4   Title Incr. gait velocity to >/= 2.0  ft/sec with  SPC for incr. gait efficiency.    Time 8    Period 22.03 secs = 1.49 ft/sec with SPC:  30.97 = 1.06 ft/sec with SPC ;  21.16 secs with SPC = 1.55 ft/sec    Status Ongoing:  05-02-22     PT LONG TERM GOAL #5   Title Pt will report ability to step up onto step stool at home to get into her bed for sleeping.    Time 8    Period Weeks  Status Ongoing   Target Date 05-02-22          ASSESSMENT:  CLINICAL IMPRESSION:  Today's PT session focused on assessment of LTG's;  pt has partially met LTG's #2 & 4 with improvement in scores noted but not to stated goal levels.  LTG #1 not met as pt reports using SPC approx. 10% time in home, with pt using RW the majority of the time to minimize fall risk.  LTG #3 not met as TUG score = 17.91 secs, improved from 20.37 secs with use of RW but not to stated goal level of <17 secs with use of SPC.  LTG #5 is ongoing as pt reports she has not yet obtained step stool but has been using a hard suitcase to step on as needed for increased ease with getting up into her high bed.  Pt will benefit from skilled PT to address gait and balance deficits and also strength deficits in her bil. LE's.   Cont with POC.     OBJECTIVE IMPAIRMENTS decreased balance, decreased endurance, difficulty walking, decreased ROM, decreased strength, increased edema, and pain.   ACTIVITY LIMITATIONS carrying, lifting, bending, standing, squatting, stairs, transfers, and locomotion level  PARTICIPATION LIMITATIONS: meal prep, cleaning, laundry, driving, and shopping  PERSONAL FACTORS Past/current experiences, Time since onset of injury/illness/exacerbation, and 1 comorbidity: lymphedema  are also affecting patient's functional outcome.   REHAB POTENTIAL:  Good  CLINICAL DECISION MAKING: Stable/uncomplicated  PT FREQUENCY: 1x/week  PT DURATION: 4 weeks (beginning 04-11-22 per pt's request)  PLANNED INTERVENTIONS: Therapeutic exercises, Therapeutic activity, Neuromuscular  re-education, Balance training, Gait training, Patient/Family education, Self Care, Stair training, and DME instructions  PLAN FOR NEXT SESSION: Cont with LE strengthening, balance and gait training; (Renewal completed 03-14-22 for 4 weeks starting 04-11-22 per pt's request)   Henderson Frampton Suzanne, PT 03/15/2022, 6:29 PM

## 2022-03-15 ENCOUNTER — Encounter: Payer: Self-pay | Admitting: Physical Therapy

## 2022-04-04 ENCOUNTER — Ambulatory Visit: Payer: Medicare Other | Admitting: Nurse Practitioner

## 2022-04-11 ENCOUNTER — Ambulatory Visit: Payer: Medicare Other | Attending: Internal Medicine | Admitting: Physical Therapy

## 2022-04-11 DIAGNOSIS — R2689 Other abnormalities of gait and mobility: Secondary | ICD-10-CM | POA: Diagnosis present

## 2022-04-11 DIAGNOSIS — M6281 Muscle weakness (generalized): Secondary | ICD-10-CM | POA: Insufficient documentation

## 2022-04-11 DIAGNOSIS — R2681 Unsteadiness on feet: Secondary | ICD-10-CM | POA: Diagnosis present

## 2022-04-11 NOTE — Therapy (Signed)
OUTPATIENT PHYSICAL THERAPY NEURO TREATMENT NOTE       Patient Name: Jeanne Lawson MRN: [id number] DOB:18-Apr-1945, 77 y.o., female Today's Date: 04/12/2022   PCP: [name], MD  REFERRING PROVIDER: [name], MD    PT End of Session - 04/12/22 1309     Visit Number 15    Number of Visits 18    Date for PT Re-Evaluation 05/02/22    Authorization Type [employer]    Authorization Time Period 11-08-21 - 01-22-22;  01-10-22 - 03-23-22;  03-14-22 - 05-23-22    PT Start Time 0931    PT Stop Time 1016    PT Time Calculation (min) 45 min    Activity Tolerance Patient tolerated treatment well    Behavior During Therapy [facility] for tasks assessed/performed                       Past Medical History:  Diagnosis Date   Acute on chronic diastolic CHF (congestive heart failure) ([place]) 04/16/2021   Cancer (HCC)    right leg skin    COPD (chronic obstructive pulmonary disease) ([place])    Coronary artery disease 1996   Known with prior mild lesion of LAD demonstrated by Cardiac Catheterization in 1996   Edema of foot    She has a history of chronic edema of the left dated back to age 31 when she suufered severe frostbite playing  on the snow as a child   Hypertension    Lower extremity edema    PAC (premature atrial contraction)    Pancreatitis    x2   PONV (postoperative nausea and vomiting)    problems waking up last time 4/16 only time   Saccular aneurysm    She also has 2 known which were stable between the MRA of October2010 and the MRA  of April 2011.   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 Has normal EF.   Past Surgical History:   Procedure Laterality Date   ABDOMINAL HYSTERECTOMY     APPENDECTOMY     CARDIAC CATHETERIZATION  1996   Mild CAD with vasospasm   CARDIOVASCULAR STRESS TEST  12-02-2001   EF 70%   CHOLECYSTECTOMY N/A 03/09/2016   Procedure: LAPAROSCOPIC CHOLECYSTECTOMY WITH INTRAOPERATIVE CHOLANGIOGRAM;  Surgeon: Georganna Skeans, MD;  Location: [place];  Service: General;  Laterality: N/A;   ENDARTERECTOMY Right 08/12/2014   Procedure: RIGHT  COMMON CAROTID ARTERY EXPOSURE FOR INTERVENTIONAL RADIOLOGY PROCEDURE BY DR.DEVASHWAR,Insertion 6 FR sheath;  Surgeon: Elam Dutch, MD;  Location: [place];  Service: Vascular;  Laterality: Right;   ENDARTERECTOMY Right 08/12/2014   Procedure:  CAROTID  EXPOSURE CLOSURE RIGHT NECK, REPAIR RIGHT COMMON CAROTID ARTERY;  Surgeon: Elam Dutch, MD;  Location: [place];  Service: Vascular;  Laterality: Right;   ENDARTERECTOMY Left 11/18/2014   Procedure: CAROTID EXPOSURE;  Surgeon: Elam Dutch, MD;  Location: [place];  Service: Vascular;  Laterality: Left;   ENDARTERECTOMY Left 11/18/2014   Procedure: CLOSURE CAROTID;  Surgeon: Elam Dutch, MD;  Location: [place];  Service: Vascular;  Laterality: Left;   ENDARTERECTOMY Right  08/22/2017   Procedure: EXPOSURE CAROTID ARTERY FOR [place] [facility];  Surgeon: Elam Dutch, MD;  Location: [facility];  Service: Vascular;  Laterality: Right;   IR ANGIO INTRA EXTRACRAN SEL INTERNAL CAROTID UNI R MOD SED  08/22/2017   IR ANGIOGRAM FOLLOW UP STUDY  08/22/2017   IR RADIOLOGIST EVAL & MGMT  06/26/2016   IR RADIOLOGIST EVAL & MGMT  05/08/2017   IR RADIOLOGY PERIPHERAL GUIDED IV START  12/03/2017   IR TRANSCATH/EMBOLIZ  08/22/2017   IR US GUIDE VASC ACCESS RIGHT  12/03/2017   LAPAROSCOPY     NECK SURGERY  50 yrs ago   Left side tumor   RADIOLOGY WITH ANESTHESIA N/A 05/25/2014   Procedure: RADIOLOGY WITH ANESTHESIA;  Surgeon: [name], MD;  Location: [place];  Service: Radiology;  Laterality: N/A;   RADIOLOGY WITH ANESTHESIA N/A  08/12/2014   Procedure: RADIOLOGY WITH ANESTHESIA;  Surgeon: [name], MD;  Location: [place];  Service: Radiology;  Laterality: N/A;   RADIOLOGY WITH ANESTHESIA N/A 03/15/2015   Procedure: MRI OF BRAIN WITHOUT CONTRAST;  Surgeon: Medication Radiologist, MD;  Location: [place];  Service: Radiology;  Laterality: N/A;  DR. TAT/MRI   US ECHOCARDIOGRAPHY  04-26-2009   EF 65-70%   VESICOVAGINAL FISTULA CLOSURE W/ TAH  25 yrs ago   WOUND EXPLORATION Right 08/22/2017   Procedure: REMOVAL OF RIGHT COMMON CAROTID SHEATH AND WOUND CLOSURE;  Surgeon: Rosetta Posner, MD;  Location: [facility] OR;  Service: Vascular;  Laterality: Right;   Patient Active Problem List   Diagnosis Date Noted   Acute on chronic diastolic (congestive) heart failure ([place]) 06/30/2021   History of CVA (cerebrovascular accident) 06/28/2021   Chronic diastolic CHF (congestive heart failure) ([place]) 06/28/2021   Lymphedema    Acute on chronic diastolic CHF (congestive heart failure) ([place]) 04/16/2021   COPD (chronic obstructive pulmonary disease) ([place])    Acute encephalopathy 04/15/2021   Edema of both lower extremities due to peripheral venous insufficiency 07/13/2020   Acute cholecystitis 03/07/2016   Small vessel disease, cerebrovascular 05/31/2015   Aneurysm, cerebral, nonruptured 05/31/2015   Dizziness and giddiness 05/31/2015   Altered mental status    Acute CVA (cerebrovascular accident) ([place]) 03/15/2015   Dysphasia 03/12/2015   Thrombocytopenia ([place]) 03/12/2015   Hypokalemia    Endotracheally intubated    Essential hypertension    Intracranial aneurysm 11/18/2014   Respiratory failure (HCC)    Cerebral aneurysm    Brain aneurysm    Aphasia    Palmar erythema    CVA (cerebral infarction) 05/02/2014   Aneurysm ([place]) 05/02/2014   Expressive aphasia 05/02/2014   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   Tobacco abuse 10/24/2010   TIA (transient ischemic attack)    Hypertension     Ventricular hypertrophy    CVA 02/23/2010   STRESS FRACTURE, FOOT 02/22/2010    ONSET DATE: March 2023:  Referral date 10-28-21  REFERRING DIAG: R26.0 (ICD-10-CM) - Ataxic gait I67.1 (ICD-10-CM) - Cerebral aneurysm, nonruptured   THERAPY DIAG:  Muscle weakness (generalized)  Other abnormalities of gait and mobility  Unsteadiness on feet  Rationale for Evaluation and Treatment Rehabilitation  SUBJECTIVE:  SUBJECTIVE STATEMENT:  Pt returns to PT since previous session on 03-14-22 due to having other medical appts with limited transportation; pt reports no changes since previous session in February - states she has been walking with the [facility] some out on her deck at her apartment    Pt accompanied by: self  PERTINENT HISTORY:  KIERIA PASCUZZI is a 77 y.o. female with medical history significant of chronic diastolic CHF, CVA, chronic severe lymphedema, COPD and hypertension who presented with worsening swelling of the lower extremity.   PAIN:    Are you having pain? Yes: NPRS scale: 2/10 Pain location: low back and Rt knee Pain description: dull, achy Aggravating factors: weight bearing Relieving factors: non weight bearing positions Pt reports constant pain in low back and Rt knee;       PRECAUTIONS: None  WEIGHT BEARING RESTRICTIONS No  FALLS: Has patient fallen in last 6 months? No  LIVING ENVIRONMENT: Lives with: lives alone and lives in an assisted living facility Lives in: Other assisted living facility Has following equipment at home: Single point cane, Environmental consultant - 2 wheeled, Electronics engineer, and Grab bars  PLOF: Independent with basic ADLs, Independent with household mobility with device, and Independent with transfers  PATIENT GOALS "to walk with my cane"   Today's Treatment:    04-11-22  Gait:  Pt gait trained 115' with SPC with CGA to SBA on flat, even surface  Ramp negotiation with SPC with CGA to SBA with ascending & descending Curb negotiation with CGA with cues for sequence and for positioning toes of each foot over edge of curb for increased forward weight shift with descension  TherEx:  5x sit to stand transfers from high low mat table without UE support Heel raises bil. LE's 10 reps with min. UE support on counter SciFit level 3.0 x 5" with Ue's and LE's for strengthening and endurance training  Step ups RLE and LLE 10 reps with UE support on hand rail - RLE 10 reps onto 6" step:  LLE 10 reps onto 6" step with bil. UE support on // bars Sidestepping with squats inside // bars 10' x 2 with min. UE support on // bar   NeuroRe-ed: Tap ups to 6" step with CGA - 5 reps each LE- with UE support on // bars prn; 5 reps each leg to 2nd step Sidestepping 10' x 2 inside // bars without UE support with SBA Rockerboard inside // bars;  10 reps with bil. UE support;  10 reps with RUE support only; 10 reps without any UE support with CGA  Pt performed stepping over and back of balance beam 5 reps each leg inside // bars with UE support prn   PATIENT EDUCATION: Education details: discussed renewal for 4 additional sessions - pt requests continuation  Person educated: Patient Education method: Explanation Education comprehension: verbalized understanding   HOME EXERCISE PROGRAM: To be established    GOALS: Goals reviewed with patient? Yes         LONG TERM GOALS: Target date: 01/06/2022       UPDATED LTG'S; TARGET DATE 03-17-22   PT Long Term Goals - 01/10/22 2014       PT LONG TERM GOAL #1   Title Pt will be modified independent with household amb. with SPC at least 75% time.    Time 8    Period Weeks    Status Not met:  Possibly 10% of the time   Target Date 03/17/22  PT LONG TERM GOAL #2   Title Improve 5x sit to stand score from  12.56 secs to </= 9 secs without UE support from mat.    Time 8    Period Weeks    Status Partially met:   11.56 secs, 9.41 secs;  03-14-22: 9.94 secs   Target Date 03/17/22      PT LONG TERM GOAL #3   Title Improve TUG score to </= 17 secs with SPC to demo improved mobility.   20.37 secs with RW ; 18.81 secs with SPC 03-14-22:  17.91 secs  8   Weeks      Time    Period    Status Not met   Target Date 03/17/22      PT LONG TERM GOAL #4   Title Incr. gait velocity to >/= 2.0  ft/sec with SPC for incr. gait efficiency.    Time 8    Period 22.03 secs = 1.49 ft/sec with SPC:  30.97 = 1.06 ft/sec with SPC ;  21.16 secs with SPC   Status Partially met 03-14-22     PT LONG TERM GOAL #5   Title Pt will report ability to step up onto step stool at home to get into her bed for sleeping.    Time 8    Period Weeks    Status Ongoing   Target Date 03/17/22                 NEW UPDATED LTG'S; TARGET DATE 05-02-22 (pt wishes to hold PT until 04-11-22 due to other medical appts);    PT Long Term Goals - established 03-14-22      PT LONG TERM GOAL   Title Pt will be modified independent with household amb. with SPC at least 75% time.    Time 8    Period Weeks    Status Not met:  Possibly 10% of the time   Target Date 05-02-22     PT LONG TERM GOAL #2   Title Improve 5x sit to stand score from 12.56 secs to </= 8 secs without UE support from mat.    Time 8    Period Weeks    Status REVISED:   11.56 secs, 9.41 secs;  03-14-22: 9.94 secs   Target Date 05-02-22     PT LONG TERM GOAL #3   Title Improve TUG score to </= 14 secs with RW to demo improved mobility.   20.37 secs with RW ; 18.81 secs with SPC 03-14-22:  17.91 secs  8   Weeks      Time    Period    Status REVISED to use of RW   Target Date 05-02-22     PT LONG TERM GOAL #4   Title Incr. gait velocity to >/= 2.0  ft/sec with SPC for incr. gait efficiency.    Time 8    Period 22.03 secs = 1.49 ft/sec with SPC:  30.97 = 1.06  ft/sec with SPC ;  21.16 secs with SPC = 1.55 ft/sec    Status Ongoing:  05-02-22     PT LONG TERM GOAL #5   Title Pt will report ability to step up onto step stool at home to get into her bed for sleeping.    Time 8    Period Weeks    Status Ongoing   Target Date 05-02-22          ASSESSMENT:  CLINICAL IMPRESSION:  Today's PT session focused on  gait training with use of SPC, bil. LE strengthening and balance training to improve SLS on each leg.  Pt needs UE support for safety with SLS balance activities.  Flexibility continues to be limited by decreased AROM bil. ankles due to lymphedema.  Pt continues to use RW for assistance with ambulation with pt reporting occasional use of SPC for assistance with ambulation in her home.  Cont with POC.     OBJECTIVE IMPAIRMENTS decreased balance, decreased endurance, difficulty walking, decreased ROM, decreased strength, increased edema, and pain.   ACTIVITY LIMITATIONS carrying, lifting, bending, standing, squatting, stairs, transfers, and locomotion level  PARTICIPATION LIMITATIONS: meal prep, cleaning, laundry, driving, and shopping  PERSONAL FACTORS Past/current experiences, Time since onset of injury/illness/exacerbation, and 1 comorbidity: lymphedema  are also affecting patient's functional outcome.   REHAB POTENTIAL:  Good  CLINICAL DECISION MAKING: Stable/uncomplicated  PT FREQUENCY: 1x/week  PT DURATION: 4 weeks (beginning 04-11-22 per pt's request)  PLANNED INTERVENTIONS: Therapeutic exercises, Therapeutic activity, Neuromuscular re-education, Balance training, Gait training, Patient/Family education, Self Care, Stair training, and DME instructions  PLAN FOR NEXT SESSION: Cont with LE strengthening, balance and gait training; (Renewal completed 03-14-22 for 4 weeks starting 04-11-22 per pt's request)   Hartleigh Edmonston Suzanne, PT 04/12/2022, 1:12 PM

## 2022-04-12 ENCOUNTER — Encounter: Payer: Self-pay | Admitting: Physical Therapy

## 2022-04-18 ENCOUNTER — Ambulatory Visit: Payer: Medicare Other | Admitting: Physical Therapy

## 2022-04-18 ENCOUNTER — Encounter: Payer: Self-pay | Admitting: Physical Therapy

## 2022-04-18 DIAGNOSIS — M6281 Muscle weakness (generalized): Secondary | ICD-10-CM

## 2022-04-18 DIAGNOSIS — R2689 Other abnormalities of gait and mobility: Secondary | ICD-10-CM

## 2022-04-18 DIAGNOSIS — R2681 Unsteadiness on feet: Secondary | ICD-10-CM

## 2022-04-18 NOTE — Therapy (Signed)
OUTPATIENT PHYSICAL THERAPY NEURO TREATMENT NOTE       Patient Name: Jeanne Lawson MRN: [id number] DOB:11-15-1945, 77 y.o., female Today's Date: 04/19/2022   PCP: [name], MD  REFERRING PROVIDER: [name], MD    PT End of Session - 04/19/22 1526     Visit Number 16    Number of Visits 18    Date for PT Re-Evaluation 05/02/22    Authorization Type [employer]    Authorization Time Period 11-08-21 - 01-22-22;  01-10-22 - 03-23-22;  03-14-22 - 05-23-22    PT Start Time 0933    PT Stop Time 1015    PT Time Calculation (min) 42 min    Equipment Utilized During Treatment Gait belt    Activity Tolerance Patient tolerated treatment well    Behavior During Therapy [facility] for tasks assessed/performed                        Past Medical History:  Diagnosis Date   Acute on chronic diastolic CHF (congestive heart failure) ([place]) 04/16/2021   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    COPD (chronic obstructive pulmonary disease) ([place])    Coronary artery disease 1996   Known with prior mild lesion of LAD demonstrated by Cardiac Catheterization in 1996   Edema of foot    She has a history of chronic edema of the left dated back to age 89 when she suufered severe frostbite playing  on the snow as a child   Hypertension    Lower extremity edema    PAC (premature atrial contraction)    Pancreatitis    x2   PONV (postoperative nausea and vomiting)    problems waking up last time 4/16 only time   Saccular aneurysm    She also has 2 known which were stable between the MRA of October2010 and the MRA  of April 2011.   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 Has normal EF.   Past Surgical History:  Procedure Laterality Date   ABDOMINAL HYSTERECTOMY     APPENDECTOMY     CARDIAC CATHETERIZATION  1996   Mild CAD with vasospasm   CARDIOVASCULAR STRESS TEST  12-02-2001   EF 70%   CHOLECYSTECTOMY N/A 03/09/2016   Procedure: LAPAROSCOPIC CHOLECYSTECTOMY WITH INTRAOPERATIVE CHOLANGIOGRAM;  Surgeon: Georganna Skeans, MD;  Location: [place];  Service: General;  Laterality: N/A;   ENDARTERECTOMY Right 08/12/2014   Procedure: RIGHT  COMMON CAROTID ARTERY EXPOSURE FOR INTERVENTIONAL RADIOLOGY PROCEDURE BY DR.DEVASHWAR,Insertion 6 FR sheath;  Surgeon: Elam Dutch, MD;  Location: [place];  Service: Vascular;  Laterality: Right;   ENDARTERECTOMY Right 08/12/2014   Procedure:  CAROTID  EXPOSURE CLOSURE RIGHT NECK, REPAIR RIGHT COMMON CAROTID ARTERY;  Surgeon: Elam Dutch, MD;  Location: [place];  Service: Vascular;  Laterality: Right;   ENDARTERECTOMY Left 11/18/2014   Procedure: CAROTID EXPOSURE;  Surgeon: Elam Dutch, MD;  Location: [place];  Service: Vascular;  Laterality: Left;   ENDARTERECTOMY Left 11/18/2014   Procedure: CLOSURE CAROTID;  Surgeon: Elam Dutch, MD;  Location: [place];  Service: Vascular;  Laterality: Left;   ENDARTERECTOMY Right 08/22/2017   Procedure: EXPOSURE CAROTID ARTERY FOR [place] [facility];  Surgeon: Elam Dutch, MD;  Location: [facility] OR;  Service: Vascular;  Laterality: Right;   IR ANGIO INTRA EXTRACRAN SEL INTERNAL CAROTID UNI R MOD SED  08/22/2017   IR ANGIOGRAM FOLLOW UP STUDY  08/22/2017   IR RADIOLOGIST EVAL & MGMT  06/26/2016   IR RADIOLOGIST EVAL & MGMT  05/08/2017   IR RADIOLOGY PERIPHERAL GUIDED IV START  12/03/2017   IR TRANSCATH/EMBOLIZ  08/22/2017   IR US GUIDE VASC ACCESS RIGHT  12/03/2017   LAPAROSCOPY     NECK SURGERY  50 yrs ago   Left side tumor   RADIOLOGY WITH ANESTHESIA N/A 05/25/2014   Procedure: RADIOLOGY WITH ANESTHESIA;  Surgeon: [name], MD;  Location: [place];  Service: Radiology;   Laterality: N/A;   RADIOLOGY WITH ANESTHESIA N/A 08/12/2014   Procedure: RADIOLOGY WITH ANESTHESIA;  Surgeon: [name], MD;  Location: [place];  Service: Radiology;  Laterality: N/A;   RADIOLOGY WITH ANESTHESIA N/A 03/15/2015   Procedure: MRI OF BRAIN WITHOUT CONTRAST;  Surgeon: Medication Radiologist, MD;  Location: [place];  Service: Radiology;  Laterality: N/A;  DR. TAT/MRI   US ECHOCARDIOGRAPHY  04-26-2009   EF 65-70%   VESICOVAGINAL FISTULA CLOSURE W/ TAH  25 yrs ago   WOUND EXPLORATION Right 08/22/2017   Procedure: REMOVAL OF RIGHT COMMON CAROTID SHEATH AND WOUND CLOSURE;  Surgeon: Rosetta Posner, MD;  Location: [facility] OR;  Service: Vascular;  Laterality: Right;   Patient Active Problem List   Diagnosis Date Noted   Acute on chronic diastolic (congestive) heart failure ([place]) 06/30/2021   History of CVA (cerebrovascular accident) 06/28/2021   Chronic diastolic CHF (congestive heart failure) ([place]) 06/28/2021   Lymphedema    Acute on chronic diastolic CHF (congestive heart failure) ([place]) 04/16/2021   COPD (chronic obstructive pulmonary disease) ([place])    Acute encephalopathy 04/15/2021   Edema of both lower extremities due to peripheral venous insufficiency 07/13/2020   Acute cholecystitis 03/07/2016   Small vessel disease, cerebrovascular 05/31/2015   Aneurysm, cerebral, nonruptured 05/31/2015   Dizziness and giddiness 05/31/2015   Altered mental status    Acute CVA (cerebrovascular accident) ([place]) 03/15/2015   Dysphasia 03/12/2015   Thrombocytopenia ([place]) 03/12/2015   Hypokalemia    Endotracheally intubated    Essential hypertension    Intracranial aneurysm 11/18/2014   Respiratory failure ([facility])    Cerebral aneurysm    Brain aneurysm    Aphasia    Palmar erythema    CVA (cerebral infarction) 05/02/2014   Aneurysm ([place]) 05/02/2014   Expressive aphasia 05/02/2014   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   Tobacco abuse 10/24/2010   TIA  (transient ischemic attack)    Hypertension    Ventricular hypertrophy    CVA 02/23/2010   STRESS FRACTURE, FOOT 02/22/2010    ONSET DATE: March 2023:  Referral date 10-28-21  REFERRING DIAG: R26.0 (ICD-10-CM) - Ataxic gait I67.1 (ICD-10-CM) - Cerebral aneurysm, nonruptured   THERAPY DIAG:  Muscle weakness (generalized)  Other abnormalities of gait and mobility  Unsteadiness on feet  Rationale for Evaluation and Treatment Rehabilitation  SUBJECTIVE:  SUBJECTIVE STATEMENT:  Pt reports she walked out on her deck last week with her cane - did not use RW - states she did fine with walking with the cane on her deck    Pt accompanied by: self  PERTINENT HISTORY:  Jeanne Lawson is a 77 y.o. female with medical history significant of chronic diastolic CHF, CVA, chronic severe lymphedema, COPD and hypertension who presented with worsening swelling of the lower extremity.   PAIN:    Are you having pain? Yes: NPRS scale: 2/10 Pain location: low back and Rt knee Pain description: dull, achy Aggravating factors: weight bearing Relieving factors: non weight bearing positions Pt reports constant pain in low back and Rt knee;       PRECAUTIONS: None  WEIGHT BEARING RESTRICTIONS No  FALLS: Has patient fallen in last 6 months? No  LIVING ENVIRONMENT: Lives with: lives alone and lives in an assisted living facility Lives in: Other assisted living facility Has following equipment at home: Single point cane, Environmental consultant - 2 wheeled, Electronics engineer, and Grab bars  PLOF: Independent with basic ADLs, Independent with household mobility with device, and Independent with transfers  PATIENT GOALS "to walk with my cane"   Today's Treatment:   04-18-22  TherEx:  5x sit to stand transfers from high low  mat table without UE support Heel raises bil. LE's 10 reps with min. UE support on counter  Step ups RLE and LLE 10 reps with UE support on hand rail;  RLE 10 reps onto 6" step:  LLE 10 reps onto 6" step with bil. UE support on // bars Sidestepping with squats inside // bars 10' x 2 with min. UE support on // bar   NeuroRe-ed: Tap ups to 6" step with CGA - 5 reps each LE- with UE support on // bars prn; 5 reps each leg to 2nd step Stepping over/back of lower height orange hurdle 10 reps each foot with CGA with UE support prn Rockerboard inside // bars;  10 reps with bil. UE support;  10 reps with RUE support only; 10 reps without any UE support with CGA Tapping cones (2) with each foot - straight ahead 3 reps; diagonal taps 3 reps each foot with UE support prn Marching 5 reps each leg without UE support  TherAct: pt performed balance activity - reaching down to retrieve cones off floor, turning 180 degrees with reach up to place each cone on cabinet shelf with SBA; pt then placed cone from shelf back down on floor with SBA - no LOB with this activity   PATIENT EDUCATION: Education details: discussed renewal for 4 additional sessions - pt requests continuation  Person educated: Patient Education method: Explanation Education comprehension: verbalized understanding   HOME EXERCISE PROGRAM: To be established    GOALS: Goals reviewed with patient? Yes         LONG TERM GOALS: Target date: 01/06/2022       UPDATED LTG'S; TARGET DATE 03-17-22   PT Long Term Goals - 01/10/22 2014       PT LONG TERM GOAL #1   Title Pt will be modified independent with household amb. with SPC at least 75% time.    Time 8    Period Weeks    Status Not met:  Possibly 10% of the time   Target Date 03/17/22      PT LONG TERM GOAL #2   Title Improve 5x sit to stand score from 12.56 secs to </= 9 secs  without UE support from mat.    Time 8    Period Weeks    Status Partially met:   11.56  secs, 9.41 secs;  03-14-22: 9.94 secs   Target Date 03/17/22      PT LONG TERM GOAL #3   Title Improve TUG score to </= 17 secs with SPC to demo improved mobility.   20.37 secs with RW ; 18.81 secs with SPC 03-14-22:  17.91 secs  8   Weeks      Time    Period    Status Not met   Target Date 03/17/22      PT LONG TERM GOAL #4   Title Incr. gait velocity to >/= 2.0  ft/sec with SPC for incr. gait efficiency.    Time 8    Period 22.03 secs = 1.49 ft/sec with SPC:  30.97 = 1.06 ft/sec with SPC ;  21.16 secs with SPC   Status Partially met 03-14-22     PT LONG TERM GOAL #5   Title Pt will report ability to step up onto step stool at home to get into her bed for sleeping.    Time 8    Period Weeks    Status Ongoing   Target Date 03/17/22                 NEW UPDATED LTG'S; TARGET DATE 05-02-22 (pt wishes to hold PT until 04-11-22 due to other medical appts);    PT Long Term Goals - established 03-14-22      PT LONG TERM GOAL   Title Pt will be modified independent with household amb. with SPC at least 75% time.    Time 8    Period Weeks    Status Not met:  Possibly 10% of the time   Target Date 05-02-22     PT LONG TERM GOAL #2   Title Improve 5x sit to stand score from 12.56 secs to </= 8 secs without UE support from mat.    Time 8    Period Weeks    Status REVISED:   11.56 secs, 9.41 secs;  03-14-22: 9.94 secs   Target Date 05-02-22     PT LONG TERM GOAL #3   Title Improve TUG score to </= 14 secs with RW to demo improved mobility.   20.37 secs with RW ; 18.81 secs with SPC 03-14-22:  17.91 secs  8   Weeks      Time    Period    Status REVISED to use of RW   Target Date 05-02-22     PT LONG TERM GOAL #4   Title Incr. gait velocity to >/= 2.0  ft/sec with SPC for incr. gait efficiency.    Time 8    Period 22.03 secs = 1.49 ft/sec with SPC:  30.97 = 1.06 ft/sec with SPC ;  21.16 secs with SPC = 1.55 ft/sec    Status Ongoing:  05-02-22     PT LONG TERM GOAL #5   Title  Pt will report ability to step up onto step stool at home to get into her bed for sleeping.    Time 8    Period Weeks    Status Ongoing   Target Date 05-02-22          ASSESSMENT:  CLINICAL IMPRESSION:  Today's PT session focused on balance training and LE strengthening.  Pt able to bend down to retrieve cone off floor, turn 180 to place up  on cabinet shelf without any LOB.  Lymphedema in bil. LE's continues to impact and limit AROM.   Cont with POC.     OBJECTIVE IMPAIRMENTS decreased balance, decreased endurance, difficulty walking, decreased ROM, decreased strength, increased edema, and pain.   ACTIVITY LIMITATIONS carrying, lifting, bending, standing, squatting, stairs, transfers, and locomotion level  PARTICIPATION LIMITATIONS: meal prep, cleaning, laundry, driving, and shopping  PERSONAL FACTORS Past/current experiences, Time since onset of injury/illness/exacerbation, and 1 comorbidity: lymphedema  are also affecting patient's functional outcome.   REHAB POTENTIAL:  Good  CLINICAL DECISION MAKING: Stable/uncomplicated  PT FREQUENCY: 1x/week  PT DURATION: 4 weeks (beginning 04-11-22 per pt's request)  PLANNED INTERVENTIONS: Therapeutic exercises, Therapeutic activity, Neuromuscular re-education, Balance training, Gait training, Patient/Family education, Self Care, Stair training, and DME instructions  PLAN FOR NEXT SESSION: Cont with LE strengthening, balance and gait training;   Alda Lea, PT 04/19/2022, 3:29 PM

## 2022-04-19 ENCOUNTER — Encounter: Payer: Self-pay | Admitting: Physical Therapy

## 2022-04-25 ENCOUNTER — Ambulatory Visit: Payer: Medicare Other | Attending: Internal Medicine | Admitting: Physical Therapy

## 2022-04-25 DIAGNOSIS — R2681 Unsteadiness on feet: Secondary | ICD-10-CM | POA: Diagnosis present

## 2022-04-25 DIAGNOSIS — R2689 Other abnormalities of gait and mobility: Secondary | ICD-10-CM | POA: Insufficient documentation

## 2022-04-25 DIAGNOSIS — M6281 Muscle weakness (generalized): Secondary | ICD-10-CM | POA: Diagnosis not present

## 2022-04-25 NOTE — Therapy (Signed)
OUTPATIENT PHYSICAL THERAPY NEURO TREATMENT NOTE       Patient Name: Jeanne Lawson MRN: [id number] DOB:[birth year], 77 y.o., female Today's Date: 04/26/2022   PCP: [name], MD  REFERRING PROVIDER: [name], MD    PT End of Session - 04/26/22 1721     Visit Number 17    Number of Visits 18    Date for PT Re-Evaluation 05/02/22    Authorization Type [employer]    Authorization Time Period 11-08-21 - 01-22-22;  01-10-22 - 03-23-22;  03-14-22 - 05-23-22    PT Start Time 0934    PT Stop Time 1017    PT Time Calculation (min) 43 min    Equipment Utilized During Treatment Gait belt    Activity Tolerance Patient tolerated treatment well    Behavior During Therapy [facility] for tasks assessed/performed                        Past Medical History:  Diagnosis Date   Acute on chronic diastolic CHF (congestive heart failure) 04/16/2021   Cancer    right leg skin    COPD (chronic obstructive pulmonary disease)    Coronary artery disease 1996   Known with prior mild lesion of LAD demonstrated by Cardiac Catheterization in 1996   Edema of foot    She has a history of chronic edema of the left dated back to age 38 when she suufered severe frostbite playing  on the snow as a child   Hypertension    Lower extremity edema    PAC (premature atrial contraction)    Pancreatitis    x2   PONV (postoperative nausea and vomiting)    problems waking up last time 4/16 only time   Saccular aneurysm    She also has 2 known which were stable between the MRA of October2010 and the MRA  of April 2011.   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 Has normal EF.    Past Surgical History:  Procedure Laterality Date   ABDOMINAL HYSTERECTOMY     APPENDECTOMY     CARDIAC CATHETERIZATION  1996   Mild CAD with vasospasm   CARDIOVASCULAR STRESS TEST  12-02-2001   EF 70%   CHOLECYSTECTOMY N/A 03/09/2016   Procedure: LAPAROSCOPIC CHOLECYSTECTOMY WITH INTRAOPERATIVE CHOLANGIOGRAM;  Surgeon: Georganna Skeans, MD;  Location: [place];  Service: General;  Laterality: N/A;   ENDARTERECTOMY Right 08/12/2014   Procedure: RIGHT  COMMON CAROTID ARTERY EXPOSURE FOR INTERVENTIONAL RADIOLOGY PROCEDURE BY DR.DEVASHWAR,Insertion 6 FR sheath;  Surgeon: Elam Dutch, MD;  Location: [place];  Service: Vascular;  Laterality: Right;   ENDARTERECTOMY Right 08/12/2014   Procedure:  CAROTID  EXPOSURE CLOSURE RIGHT NECK, REPAIR RIGHT COMMON CAROTID ARTERY;  Surgeon: Elam Dutch, MD;  Location: [place];  Service: Vascular;  Laterality: Right;   ENDARTERECTOMY Left 11/18/2014   Procedure: CAROTID EXPOSURE;  Surgeon: Elam Dutch, MD;  Location: [place];  Service: Vascular;  Laterality: Left;   ENDARTERECTOMY Left 11/18/2014   Procedure: CLOSURE CAROTID;  Surgeon: Elam Dutch, MD;  Location: [place];  Service: Vascular;  Laterality: Left;   ENDARTERECTOMY Right 08/22/2017   Procedure: EXPOSURE CAROTID ARTERY FOR [place] [facility];  Surgeon: Elam Dutch, MD;  Location: [facility] OR;  Service: Vascular;  Laterality: Right;   IR ANGIO INTRA EXTRACRAN SEL INTERNAL CAROTID UNI R MOD SED  08/22/2017   IR ANGIOGRAM FOLLOW UP STUDY  08/22/2017   IR RADIOLOGIST EVAL & MGMT  06/26/2016   IR RADIOLOGIST EVAL & MGMT  05/08/2017   IR RADIOLOGY PERIPHERAL GUIDED IV START  12/03/2017   IR TRANSCATH/EMBOLIZ  08/22/2017   IR US GUIDE VASC ACCESS RIGHT  12/03/2017   LAPAROSCOPY     NECK SURGERY  50 yrs ago   Left side tumor   RADIOLOGY WITH ANESTHESIA N/A 05/25/2014   Procedure: RADIOLOGY WITH ANESTHESIA;  Surgeon: [name], MD;  Location: [place];  Service: Radiology;  Laterality: N/A;    RADIOLOGY WITH ANESTHESIA N/A 08/12/2014   Procedure: RADIOLOGY WITH ANESTHESIA;  Surgeon: [name], MD;  Location: [place];  Service: Radiology;  Laterality: N/A;   RADIOLOGY WITH ANESTHESIA N/A 03/15/2015   Procedure: MRI OF BRAIN WITHOUT CONTRAST;  Surgeon: Medication Radiologist, MD;  Location: [place];  Service: Radiology;  Laterality: N/A;  DR. TAT/MRI   US ECHOCARDIOGRAPHY  04-26-2009   EF 65-70%   VESICOVAGINAL FISTULA CLOSURE W/ TAH  25 yrs ago   WOUND EXPLORATION Right 08/22/2017   Procedure: REMOVAL OF RIGHT COMMON CAROTID SHEATH AND WOUND CLOSURE;  Surgeon: Rosetta Posner, MD;  Location: [facility] OR;  Service: Vascular;  Laterality: Right;   Patient Active Problem List   Diagnosis Date Noted   Acute on chronic diastolic (congestive) heart failure 06/30/2021   History of CVA (cerebrovascular accident) 06/28/2021   Chronic diastolic CHF (congestive heart failure) 06/28/2021   Lymphedema    Acute on chronic diastolic CHF (congestive heart failure) 04/16/2021   COPD (chronic obstructive pulmonary disease)    Acute encephalopathy 04/15/2021   Edema of both lower extremities due to peripheral venous insufficiency 07/13/2020   Acute cholecystitis 03/07/2016   Small vessel disease, cerebrovascular 05/31/2015   Aneurysm, cerebral, nonruptured 05/31/2015   Dizziness and giddiness 05/31/2015   Altered mental status    Acute CVA (cerebrovascular accident) 03/15/2015   Dysphasia 03/12/2015   Thrombocytopenia 03/12/2015   Hypokalemia    Endotracheally intubated    Essential hypertension    Intracranial aneurysm 11/18/2014   Respiratory failure    Cerebral aneurysm    Brain aneurysm    Aphasia    Palmar erythema    CVA (cerebral infarction) 05/02/2014   Aneurysm 05/02/2014   Expressive aphasia 05/02/2014   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   Tobacco abuse 10/24/2010   TIA (transient ischemic attack)    Hypertension    Ventricular hypertrophy     CVA 02/23/2010   STRESS FRACTURE, FOOT 02/22/2010    ONSET DATE: March 2023:  Referral date 10-28-21  REFERRING DIAG: R26.0 (ICD-10-CM) - Ataxic gait I67.1 (ICD-10-CM) - Cerebral aneurysm, nonruptured   THERAPY DIAG:  Muscle weakness (generalized)  Unsteadiness on feet  Rationale for Evaluation and Treatment Rehabilitation  SUBJECTIVE:  SUBJECTIVE STATEMENT:  Pt reports she developed the beginning of a UTI last week - was able to get antibiotic and it really helped - is feeling better today.  Pt states she has been walking some at home with her cane    Pt accompanied by: self  PERTINENT HISTORY:  MARTE BECKNELL is a 77 y.o. female with medical history significant of chronic diastolic CHF, CVA, chronic severe lymphedema, COPD and hypertension who presented with worsening swelling of the lower extremity.   PAIN:    Are you having pain? Yes: NPRS scale: 3/10 Pain location: low back and Rt knee Pain description: dull, achy Aggravating factors: weight bearing Relieving factors: non weight bearing positions Pt reports constant pain in low back and Rt knee;       PRECAUTIONS: None  WEIGHT BEARING RESTRICTIONS No  FALLS: Has patient fallen in last 6 months? No  LIVING ENVIRONMENT: Lives with: lives alone and lives in an assisted living facility Lives in: Other assisted living facility Has following equipment at home: Single point cane, Environmental consultant - 2 wheeled, Electronics engineer, and Grab bars  PLOF: Independent with basic ADLs, Independent with household mobility with device, and Independent with transfers  PATIENT GOALS "to walk with my cane"   Today's Treatment:   04-25-22  TherEx:  Heel raises bil. LE's 10 reps with min. UE support on counter;  unilateral heel raises - RLE 10 reps:  LLE  10 reps  Step ups RLE and LLE 10 reps with UE support on hand rail;  RLE 10 reps onto 6" step:  LLE 10 reps onto 6" step with bil. UE support on // bars Sidestepping with squats inside // bars 10' x 2 with min. UE support on // bar Standing hip exercises with 3# weight -  bil. LE's - hip flexion with knee extended, hip flexion with knee flexed, hip abduction, hip extension 10 reps bil. LE's   NeuroRe-ed: Tap ups to 6" step with CGA - 5 reps each LE- with UE support on // bars prn; 5 reps each leg to 2nd step Rockerboard inside // bars;  10 reps with bil. UE support;  10 reps with RUE support only  Tapping cones (2) with each foot - straight ahead 3 reps; diagonal taps 3 reps each foot with UE support prn Marching 5 reps each leg without UE support    PATIENT EDUCATION: Education details: discussed renewal for 4 additional sessions - pt requests continuation  Person educated: Patient Education method: Explanation Education comprehension: verbalized understanding   HOME EXERCISE PROGRAM: To be established    GOALS: Goals reviewed with patient? Yes         LONG TERM GOALS: Target date: 01/06/2022       UPDATED LTG'S; TARGET DATE 03-17-22   PT Long Term Goals - 01/10/22 2014       PT LONG TERM GOAL #1   Title Pt will be modified independent with household amb. with SPC at least 75% time.    Time 8    Period Weeks    Status Not met:  Possibly 10% of the time   Target Date 03/17/22      PT LONG TERM GOAL #2   Title Improve 5x sit to stand score from 12.56 secs to </= 9 secs without UE support from mat.    Time 8    Period Weeks    Status Partially met:   11.56 secs, 9.41 secs;  03-14-22: 9.94 secs   Target  Date 03/17/22      PT LONG TERM GOAL #3   Title Improve TUG score to </= 17 secs with SPC to demo improved mobility.   20.37 secs with RW ; 18.81 secs with SPC 03-14-22:  17.91 secs  8   Weeks      Time    Period    Status Not met   Target Date 03/17/22       PT LONG TERM GOAL #4   Title Incr. gait velocity to >/= 2.0  ft/sec with SPC for incr. gait efficiency.    Time 8    Period 22.03 secs = 1.49 ft/sec with SPC:  30.97 = 1.06 ft/sec with SPC ;  21.16 secs with SPC   Status Partially met 03-14-22     PT LONG TERM GOAL #5   Title Pt will report ability to step up onto step stool at home to get into her bed for sleeping.    Time 8    Period Weeks    Status Ongoing   Target Date 03/17/22                 NEW UPDATED LTG'S; TARGET DATE 05-02-22 (pt wishes to hold PT until 04-11-22 due to other medical appts);    PT Long Term Goals - established 03-14-22      PT LONG TERM GOAL   Title Pt will be modified independent with household amb. with SPC at least 75% time.    Time 8    Period Weeks    Status Not met:  Possibly 10% of the time   Target Date 05-02-22     PT LONG TERM GOAL #2   Title Improve 5x sit to stand score from 12.56 secs to </= 8 secs without UE support from mat.    Time 8    Period Weeks    Status REVISED:   11.56 secs, 9.41 secs;  03-14-22: 9.94 secs   Target Date 05-02-22     PT LONG TERM GOAL #3   Title Improve TUG score to </= 14 secs with RW to demo improved mobility.   20.37 secs with RW ; 18.81 secs with SPC 03-14-22:  17.91 secs  8   Weeks      Time    Period    Status REVISED to use of RW   Target Date 05-02-22     PT LONG TERM GOAL #4   Title Incr. gait velocity to >/= 2.0  ft/sec with SPC for incr. gait efficiency.    Time 8    Period 22.03 secs = 1.49 ft/sec with SPC:  30.97 = 1.06 ft/sec with SPC ;  21.16 secs with SPC = 1.55 ft/sec    Status Ongoing:  05-02-22     PT LONG TERM GOAL #5   Title Pt will report ability to step up onto step stool at home to get into her bed for sleeping.    Time 8    Period Weeks    Status Ongoing   Target Date 05-02-22          ASSESSMENT:  CLINICAL IMPRESSION:  Today's PT session focused on LE strengthening and balance exercises to improve SLS on each leg.   Lymphedema in bil. LE's continues to limit flexibility and ROM.  Pt is progressing well towards goals.  Cont with POC.     OBJECTIVE IMPAIRMENTS decreased balance, decreased endurance, difficulty walking, decreased ROM, decreased strength, increased edema, and pain.  ACTIVITY LIMITATIONS carrying, lifting, bending, standing, squatting, stairs, transfers, and locomotion level  PARTICIPATION LIMITATIONS: meal prep, cleaning, laundry, driving, and shopping  PERSONAL FACTORS Past/current experiences, Time since onset of injury/illness/exacerbation, and 1 comorbidity: lymphedema  are also affecting patient's functional outcome.   REHAB POTENTIAL:  Good  CLINICAL DECISION MAKING: Stable/uncomplicated  PT FREQUENCY: 1x/week  PT DURATION: 4 weeks (beginning 04-11-22 per pt's request)  PLANNED INTERVENTIONS: Therapeutic exercises, Therapeutic activity, Neuromuscular re-education, Balance training, Gait training, Patient/Family education, Self Care, Stair training, and DME instructions  PLAN FOR NEXT SESSION: Cont with LE strengthening, balance and gait training;   Alda Lea, PT 04/26/2022, 5:23 PM

## 2022-04-26 ENCOUNTER — Encounter: Payer: Self-pay | Admitting: Physical Therapy

## 2022-05-02 ENCOUNTER — Ambulatory Visit: Payer: Medicare Other | Admitting: Physical Therapy

## 2022-05-02 DIAGNOSIS — R2689 Other abnormalities of gait and mobility: Secondary | ICD-10-CM

## 2022-05-02 DIAGNOSIS — M6281 Muscle weakness (generalized): Secondary | ICD-10-CM

## 2022-05-02 DIAGNOSIS — R2681 Unsteadiness on feet: Secondary | ICD-10-CM

## 2022-05-02 NOTE — Therapy (Signed)
OUTPATIENT PHYSICAL THERAPY NEURO TREATMENT NOTE       Patient Name: Jeanne Lawson MRN: [id number] DOB:1945/12/14, 77 y.o., female Today's Date: 05/02/2022   PCP: Alysia Penna, MD  REFERRING PROVIDER: Alysia Penna, MD                 Past Medical History:  Diagnosis Date   Acute on chronic diastolic CHF (congestive heart failure) 04/16/2021   Cancer    right leg skin    COPD (chronic obstructive pulmonary disease)    Coronary artery disease 1996   Known with prior mild lesion of LAD demonstrated by Cardiac Catheterization in 1996   Edema of foot    She has a history of chronic edema of the left dated back to age 46 when she suufered severe frostbite playing  on the snow as a child   Hypertension    Lower extremity edema    PAC (premature atrial contraction)    Pancreatitis    x2   PONV (postoperative nausea and vomiting)    problems waking up last time 4/16 only time   Saccular aneurysm    She also has 2 known which were stable between the MRA of October2010 and the MRA  of April 2011.   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 Has normal EF.   Past Surgical History:  Procedure Laterality Date   ABDOMINAL HYSTERECTOMY     APPENDECTOMY     CARDIAC CATHETERIZATION  1996   Mild CAD with vasospasm   CARDIOVASCULAR STRESS TEST  12-02-2001   EF 70%   CHOLECYSTECTOMY N/A 03/09/2016   Procedure: LAPAROSCOPIC CHOLECYSTECTOMY WITH INTRAOPERATIVE CHOLANGIOGRAM;  Surgeon: Violeta Gelinas, MD;  Location: [facility] OR;  Service: General;  Laterality: N/A;   ENDARTERECTOMY Right 08/12/2014   Procedure: RIGHT  COMMON CAROTID ARTERY EXPOSURE FOR INTERVENTIONAL  RADIOLOGY PROCEDURE BY DR.DEVASHWAR,Insertion 6 FR sheath;  Surgeon: Sherren Kerns, MD;  Location: [facility] OR;  Service: Vascular;  Laterality: Right;   ENDARTERECTOMY Right 08/12/2014   Procedure:  CAROTID  EXPOSURE CLOSURE RIGHT NECK, REPAIR RIGHT COMMON CAROTID ARTERY;  Surgeon: Sherren Kerns, MD;  Location: [facility] OR;  Service: Vascular;  Laterality: Right;   ENDARTERECTOMY Left 11/18/2014   Procedure: CAROTID EXPOSURE;  Surgeon: Sherren Kerns, MD;  Location: [facility] OR;  Service: Vascular;  Laterality: Left;   ENDARTERECTOMY Left 11/18/2014   Procedure: CLOSURE CAROTID;  Surgeon: Sherren Kerns, MD;  Location: [facility] OR;  Service: Vascular;  Laterality: Left;   ENDARTERECTOMY Right 08/22/2017   Procedure: EXPOSURE CAROTID ARTERY FOR DEVESHWAR SURGERY;  Surgeon: Sherren Kerns, MD;  Location: [facility] OR;  Service: Vascular;  Laterality: Right;   IR ANGIO INTRA EXTRACRAN SEL INTERNAL CAROTID UNI R MOD SED  08/22/2017   IR ANGIOGRAM FOLLOW UP STUDY  08/22/2017   IR RADIOLOGIST EVAL & MGMT  06/26/2016   IR RADIOLOGIST EVAL & MGMT  05/08/2017   IR RADIOLOGY PERIPHERAL GUIDED IV START  12/03/2017   IR TRANSCATH/EMBOLIZ  08/22/2017   IR US GUIDE VASC ACCESS RIGHT  12/03/2017   LAPAROSCOPY     NECK SURGERY  50 yrs ago  Left side tumor   RADIOLOGY WITH ANESTHESIA N/A 05/25/2014   Procedure: RADIOLOGY WITH ANESTHESIA;  Surgeon: Julieanne Cotton, MD;  Location: [facility] OR;  Service: Radiology;  Laterality: N/A;   RADIOLOGY WITH ANESTHESIA N/A 08/12/2014   Procedure: RADIOLOGY WITH ANESTHESIA;  Surgeon: Julieanne Cotton, MD;  Location: [facility] OR;  Service: Radiology;  Laterality: N/A;   RADIOLOGY WITH ANESTHESIA N/A 03/15/2015   Procedure: MRI OF BRAIN WITHOUT CONTRAST;  Surgeon: Medication Radiologist, MD;  Location: [facility] OR;  Service: Radiology;  Laterality: N/A;  DR. TAT/MRI   US ECHOCARDIOGRAPHY  04-26-2009   EF 65-70%   VESICOVAGINAL FISTULA CLOSURE W/ TAH  25 yrs ago   WOUND EXPLORATION Right 08/22/2017   Procedure:  REMOVAL OF RIGHT COMMON CAROTID SHEATH AND WOUND CLOSURE;  Surgeon: Larina Earthly, MD;  Location: [facility] OR;  Service: Vascular;  Laterality: Right;   Patient Active Problem List   Diagnosis Date Noted   Acute on chronic diastolic (congestive) heart failure 06/30/2021   History of CVA (cerebrovascular accident) 06/28/2021   Chronic diastolic CHF (congestive heart failure) 06/28/2021   Lymphedema    Acute on chronic diastolic CHF (congestive heart failure) 04/16/2021   COPD (chronic obstructive pulmonary disease)    Acute encephalopathy 04/15/2021   Edema of both lower extremities due to peripheral venous insufficiency 07/13/2020   Acute cholecystitis 03/07/2016   Small vessel disease, cerebrovascular 05/31/2015   Aneurysm, cerebral, nonruptured 05/31/2015   Dizziness and giddiness 05/31/2015   Altered mental status    Acute CVA (cerebrovascular accident) 03/15/2015   Dysphasia 03/12/2015   Thrombocytopenia 03/12/2015   Hypokalemia    Endotracheally intubated    Essential hypertension    Intracranial aneurysm 11/18/2014   Respiratory failure    Cerebral aneurysm    Brain aneurysm    Aphasia    Palmar erythema    CVA (cerebral infarction) 05/02/2014   Aneurysm 05/02/2014   Expressive aphasia 05/02/2014   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   Tobacco abuse 10/24/2010   TIA (transient ischemic attack)    Hypertension    Ventricular hypertrophy    CVA 02/23/2010   STRESS FRACTURE, FOOT 02/22/2010    ONSET DATE: March 2023:  Referral date 10-28-21  REFERRING DIAG: R26.0 (ICD-10-CM) - Ataxic gait I67.1 (ICD-10-CM) - Cerebral aneurysm, nonruptured   THERAPY DIAG:  No diagnosis found.  Rationale for Evaluation and Treatment Rehabilitation  SUBJECTIVE:  SUBJECTIVE STATEMENT:  Pt reports she developed the beginning of a UTI last week - was able to get antibiotic and it really helped - is feeling better today.  Pt states she has been walking some at home with her cane    Pt accompanied by: self  PERTINENT HISTORY:  Jeanne Lawson is a 77 y.o. female with medical history significant of chronic diastolic CHF, CVA, chronic severe lymphedema, COPD and hypertension who presented with worsening swelling of the lower extremity.   PAIN:   No pain at this time  Are you having pain? Yes: NPRS scale: varies in intensity/10 Pain location: low back and Rt knee Pain description: dull, achy Aggravating factors: weight bearing Relieving factors: non weight bearing positions Pt reports constant pain in low back and Rt knee;       PRECAUTIONS: None  WEIGHT BEARING RESTRICTIONS No  FALLS: Has patient fallen in last 6 months? No  LIVING ENVIRONMENT: Lives with: lives alone and lives in an assisted living facility Lives in: Other assisted living facility Has following equipment at home: Single point cane, Environmental consultant - 2 wheeled, Tour manager, and Grab bars  PLOF: Independent with basic ADLs, Independent with household mobility with device, and Independent with transfers  PATIENT GOALS "to walk with my cane"   Today's Treatment:   04-25-22  TherEx:  Heel raises bil. LE's 10 reps with min. UE support on counter;  unilateral heel raises - RLE 10 reps:  LLE 10 reps  Step ups RLE and LLE 10 reps with UE support on hand rail;  RLE 10 reps onto 6" step:  LLE 10 reps onto 6" step with bil. UE support on // bars Sidestepping with squats inside // bars 10' x 2 with min. UE support on // bar Standing hip exercises with 3# weight -  bil. LE's - hip flexion with knee extended, hip flexion with knee flexed, hip abduction, hip extension 10 reps bil. LE's   NeuroRe-ed: Tap ups to 6" step with CGA - 5 reps each LE- with UE support on // bars prn; 5 reps each  leg to 2nd step Rockerboard inside // bars;  10 reps with bil. UE support;  10 reps with RUE support only  Tapping cones (2) with each foot - straight ahead 3 reps; diagonal taps 3 reps each foot with UE support prn Marching 5 reps each leg without UE support    PATIENT EDUCATION: Education details: discussed renewal for 4 additional sessions - pt requests continuation  Person educated: Patient Education method: Explanation Education comprehension: verbalized understanding   HOME EXERCISE PROGRAM: To be established    GOALS: Goals reviewed with patient? Yes         LONG TERM GOALS: Target date: 01/06/2022       UPDATED LTG'S; TARGET DATE 03-17-22   PT Long Term Goals - 01/10/22 2014       PT LONG TERM GOAL #1   Title Pt will be modified independent with household amb. with SPC at least 75% time.    Time 8    Period Weeks    Status Not met:  Possibly 10% of the time   Target Date 03/17/22      PT LONG TERM GOAL #2   Title Improve 5x sit to stand score from 12.56 secs to </= 9 secs without UE support from mat.    Time 8    Period Weeks    Status Partially met:   11.56 secs, 9.41 secs;  03-14-22: 9.94 secs   Target Date 03/17/22      PT LONG TERM GOAL #3   Title Improve TUG score to </= 17 secs with SPC to demo improved mobility.   20.37 secs with RW ; 18.81 secs with SPC 03-14-22:  17.91 secs  8   Weeks      Time    Period    Status Not met   Target Date 03/17/22      PT LONG TERM GOAL #4   Title Incr. gait velocity to >/= 2.0  ft/sec with SPC for incr. gait efficiency.    Time 8    Period 22.03 secs = 1.49 ft/sec with SPC:  30.97 = 1.06 ft/sec with SPC ;  21.16 secs with SPC   Status Partially met 03-14-22     PT LONG TERM GOAL #5   Title Pt will report ability to step up onto step stool at home to get into her bed for sleeping.    Time 8    Period Weeks    Status Ongoing   Target Date 03/17/22                 NEW UPDATED LTG'S; TARGET DATE  05-02-22 (pt wishes to hold PT until 04-11-22 due to other medical appts);    PT Long Term Goals - established 03-14-22      PT LONG TERM GOAL   Title Pt will be modified independent with household amb. with SPC at least 75% time.    Time 8    Period Weeks    Status Not met:  Possibly 25% of the time   Target Date 05-02-22     PT LONG TERM GOAL #2   Title Improve 5x sit to stand score from 12.56 secs to </= 8 secs without UE support from mat.    Time 8    Period Weeks    Status REVISED:   11.56 secs, 9.41 secs;  03-14-22: 9.94 secs;  11.63 secs   Target Date 05-02-22     PT LONG TERM GOAL #3   Title Improve TUG score to </= 14 secs with RW to demo improved mobility.   20.37 secs with RW ; 18.81 secs with SPC 03-14-22:  17.91 secs  8   Weeks      Time    Period    Status REVISED to use of RW   Target Date 05-02-22     PT LONG TERM GOAL #4   Title Incr. gait velocity to >/= 2.0  ft/sec with SPC for incr. gait efficiency.    Time 8    Period 22.03 secs = 1.49 ft/sec with SPC:  30.97 = 1.06 ft/sec with SPC ;  21.16 secs with SPC = 1.55 ft/sec    Status Ongoing:  05-02-22     PT LONG TERM GOAL #5   Title Pt will report ability to step up onto step stool at home to get into her bed for sleeping.    Time 8    Period Weeks    Status Deferred   Target Date 05-02-22          ASSESSMENT:  CLINICAL IMPRESSION:  Today's PT session focused on LE strengthening and balance exercises to improve SLS on each leg.  Lymphedema in bil. LE's continues to limit flexibility and ROM.  Pt is progressing well towards goals.  Cont with POC.     OBJECTIVE IMPAIRMENTS decreased balance, decreased endurance, difficulty walking, decreased ROM,  decreased strength, increased edema, and pain.   ACTIVITY LIMITATIONS carrying, lifting, bending, standing, squatting, stairs, transfers, and locomotion level  PARTICIPATION LIMITATIONS: meal prep, cleaning, laundry, driving, and shopping  PERSONAL FACTORS  Past/current experiences, Time since onset of injury/illness/exacerbation, and 1 comorbidity: lymphedema  are also affecting patient's functional outcome.   REHAB POTENTIAL:  Good  CLINICAL DECISION MAKING: Stable/uncomplicated  PT FREQUENCY: 1x/week  PT DURATION: 4 weeks (beginning 04-11-22 per pt's request)  PLANNED INTERVENTIONS: Therapeutic exercises, Therapeutic activity, Neuromuscular re-education, Balance training, Gait training, Patient/Family education, Self Care, Stair training, and DME instructions  PLAN FOR NEXT SESSION: Cont with LE strengthening, balance and gait training;   Kary KosDilday, Takoda Janowiak Suzanne, PT 05/02/2022, 9:32 AM

## 2022-05-02 NOTE — Progress Notes (Signed)
Cardiology Office Note:    Date:  05/16/2022   ID:  Jeanne Lawson, DOB [birth year], MRN [id number]  PCP:  Alysia Penna, MD  [facility] Providers Cardiologist:  Meriam Sprague, MD     Referring MD: Alysia Penna, MD   Chief Complaint:  Follow-up     History of Present Illness:   Jeanne Lawson is a 77 y.o. female with  a hx of multiple strokes, hypertension, coronary artery disease with cardiac cath and angioplasty approximately 20 years ago but no stents placement.  We do not have record about that procedure. She does have a chronically abnormal EKG with anterolateral T wave inversions. She was also found to have enlarging aneurysms of the posterior circulation that was stented by Dr. Corliss Skains. 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 She has recovered well from this. She is no longer on Plavix but takes ASA daily.   Patient was last seen by Dr. Delton See 06/2019 and had worsening lymphedema and started on lasix and spiro and referred to lymphedema wrapping.   Was seen in the hospital 03/2021 with acute on chronic diastolic CHF. Echo LVEF 65-70% grade 1 DD.  Readmitted 06/2021 with the same and diuresed 9L. Was sent to SNF and lymphedema clinic.   Patient comes in for f/u. Doing much better. She brings labs from 12/2021 LDL 62, Crt 0.8 K 4.7 TSH 7.82. She is in independent living. Discharged from lymphedema clinic. No longer wraps her legs. Watches her salt closely.            Past Medical History:  Diagnosis Date   Acute on chronic diastolic CHF (congestive heart failure) 04/16/2021   Cancer    right leg skin    COPD (chronic obstructive pulmonary disease)    Coronary artery disease 1996   Known with prior mild lesion of LAD demonstrated by Cardiac Catheterization in 1996   Edema of foot    She has a  history of chronic edema of the left dated back to age 62 when she suufered severe frostbite playing  on the snow as a child   Hypertension    Lower extremity edema    PAC (premature atrial contraction)    Pancreatitis    x2   PONV (postoperative nausea and vomiting)    problems waking up last time 4/16 only time   Saccular aneurysm    She also has 2 known which were stable between the MRA of October2010 and the MRA  of April 2011.   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 Has normal EF.   Current Medications: Current Meds  Medication Sig   albuterol (PROVENTIL HFA;VENTOLIN HFA) 108 (90 BASE) MCG/ACT inhaler Inhale 1-2 puffs into the lungs every 6 (six) hours as needed for wheezing or shortness of breath.   aspirin EC 81 MG tablet Take 81 mg by mouth daily with lunch.    furosemide (LASIX) 40 MG tablet Take 1.5 tablets (60 mg total) by mouth daily.   gabapentin (NEURONTIN)  100 MG capsule Take 100 mg by mouth.   HYDROcodone-acetaminophen (NORCO/VICODIN) 5-325 MG tablet Take 1 tablet by mouth every 6 (six) hours as needed for moderate pain.   potassium chloride SA (KLOR-CON M) 20 MEQ tablet Take 2 tablets (40 mEq total) by mouth daily.   TOPROL XL 50 MG 24 hr tablet TAKE 1/2 TABLET BY MOUTH TWICE DAILY. TAKE WITH OR IMMEDIATELY FOLLOWING A MEAL Please keep upcoming appt with Dr. Shari ProwsPemberton new Cardiologist in April 2023 before anymore refills. Thank you Final Attempt (Patient taking differently: Take 25 mg by mouth daily. TAKE 1/2 TABLET BY MOUTH TWICE DAILY. TAKE WITH OR IMMEDIATELY FOLLOWING A MEAL Please keep upcoming appt with Dr. Shari ProwsPemberton new Cardiologist in April 2023 before  anymore refills. Thank you Final Attempt)    Allergies:   Dilaudid [hydromorphone], Latex, Lidocaine, Sulfa drugs cross reactors, and Codeine   Social History   Tobacco Use   Smoking status: Former    Packs/day: 1.00    Years: 30.00    Additional pack years: 0.00    Total pack years: 30.00    Types: Cigarettes    Quit date: 04/26/2014    Years since quitting: 8.0   Smokeless tobacco: Never  Vaping Use   Vaping Use: Never used  Substance Use Topics   Alcohol use: No   Drug use: No    Family Hx: The patient's family history includes Aneurysm in her sister; Diabetes in her father; Emphysema in her father; Heart disease in her mother; Hypertension in her daughter and mother; Stroke in her mother; Thyroid disease in her daughter.  ROS     Physical Exam:    VS:  BP 136/78   Pulse (!) 59   Ht 5\' 1"  (1.549 m)   Wt 134 lb (60.8 kg)   SpO2 98%   BMI 25.32 kg/m     Wt Readings from Last 3 Encounters:  05/16/22 134 lb (60.8 kg)  07/04/21 137 lb 12.6 oz (62.5 kg)  04/22/21 145 lb 8.1 oz (66 kg)    Physical Exam  GEN: Well nourished, well developed, in no acute distress  Neck: no JVD, carotid bruits, or masses Cardiac:RRR; no murmurs, rubs, or gallops  Respiratory:  clear to auscultation bilaterally, normal work of breathing GI: soft, nontender, nondistended, + BS Ext: Bileateral lymphedema L>R  venous stasis. Difficult to palpate distal pulses Psych: euthymic mood, full affect        EKGs/Labs/Other Test Reviewed:    EKG:  EKG is  not ordered today.     Recent Labs: 06/28/2021: B Natriuretic Peptide 367.1 07/01/2021: Hemoglobin 12.0; Platelets 150 07/03/2021: BUN 13; Creatinine, Ser 0.94; Potassium 3.7; Sodium 137   Recent Lipid Panel No results for input(s): "CHOL", "TRIG", "HDL", "VLDL", "LDLCALC", "LDLDIRECT" in the last 8760 hours.   Prior CV Studies:   Echo 04/17/21: 1. Left ventricular ejection fraction, by estimation, is 65 to 70%. The  left ventricle has  normal function. The left ventricle has no regional  wall motion abnormalities. There is mild left ventricular hypertrophy.  Left ventricular diastolic parameters  are consistent with Grade I diastolic dysfunction (impaired relaxation).   2. Right ventricular systolic function is normal. The right ventricular  size is normal. Tricuspid regurgitation signal is inadequate for assessing  PA pressure.   3. The mitral valve is grossly normal. Trivial mitral valve  regurgitation.   4. The aortic valve is tricuspid. Aortic valve regurgitation is not  visualized.   5. The inferior vena cava  is dilated in size with >50% respiratory  variability, suggesting right atrial pressure of 8 mmHg.    LE Venous Doppler 04/15/21: No DVT   Risk Assessment/Calculations/Metrics:              ASSESSMENT & PLAN:   No problem-specific Assessment & Plan notes found for this encounter.   Chronic diastolic CHF, chronic lymphedema -She no longer goes to lymphedema clinic and is doing well with diet and lasix. No changes. Labs reviewed from PCP and stable.    CAD: She has a history of prior coronary artery disease and 18 years ago Dr. Ty Hilts did cardiac catheterization and angioplasty but no stent. We don't have those records. The patient has had several subsequent Cardiolite stress tests which have been normal. She denies any anginal symptoms. No CP or dyspnea. No exertional symptoms. Remains on ASA and Metoprolol.    HTN: controlled    H/o Strokes: followed by neuro. No longer on Plavix due to h/o bleeding and gastric irrigation. Doing ok on ASA. No stroke like symptoms.    Right ICA aneurysm:  bilateral stents with right 8 mm aneurysm on CT 03/2021. To f/u with Dr. Corliss Skains.               Dispo:  No follow-ups on file.   Medication Adjustments/Labs and Tests Ordered: Current medicines are reviewed at length with the patient today.  Concerns regarding medicines are outlined above.  Tests  Ordered: No orders of the defined types were placed in this encounter.  Medication Changes: No orders of the defined types were placed in this encounter.  Signed, Jacolyn Reedy, PA-C  05/16/2022 10:16 AM    [facility] [address] [place], [place], [place]  [address] Phone: [phone]; Fax: [phone]

## 2022-05-03 ENCOUNTER — Encounter: Payer: Self-pay | Admitting: Physical Therapy

## 2022-05-16 ENCOUNTER — Ambulatory Visit: Payer: Medicare Other | Attending: Nurse Practitioner | Admitting: Physician Assistant

## 2022-05-16 VITALS — BP 136/78 | HR 59 | Ht 61.0 in | Wt 134.0 lb

## 2022-05-16 DIAGNOSIS — Z8673 Personal history of transient ischemic attack (TIA), and cerebral infarction without residual deficits: Secondary | ICD-10-CM

## 2022-05-16 DIAGNOSIS — I671 Cerebral aneurysm, nonruptured: Secondary | ICD-10-CM

## 2022-05-16 DIAGNOSIS — I5032 Chronic diastolic (congestive) heart failure: Secondary | ICD-10-CM

## 2022-05-16 DIAGNOSIS — I119 Hypertensive heart disease without heart failure: Secondary | ICD-10-CM | POA: Diagnosis not present

## 2022-05-16 DIAGNOSIS — I2510 Atherosclerotic heart disease of native coronary artery without angina pectoris: Secondary | ICD-10-CM | POA: Diagnosis not present

## 2022-05-16 NOTE — Patient Instructions (Signed)
Medication Instructions:  Your physician recommends that you continue on your current medications as directed. Please refer to the Current Medication list given to you today.  *If you need a refill on your cardiac medications before your next appointment, please call your pharmacy*   Lab Work: NONE If you have labs (blood work) drawn today and your tests are completely normal, you will receive your results only by: MyChart Message (if you have MyChart) OR A paper copy in the mail If you have any lab test that is abnormal or we need to change your treatment, we will call you to review the results.   Testing/Procedures: NONE   Follow-Up: At [facility], you and your health needs are our priority.  As part of our continuing mission to provide you with exceptional heart care, we have created designated Provider Care Teams.  These Care Teams include your primary Cardiologist (physician) and Advanced Practice Providers (APPs -  Physician Assistants and Nurse Practitioners) who all work together to provide you with the care you need, when you need it.  We recommend signing up for the patient portal called "MyChart".  Sign up information is provided on this After Visit Summary.  MyChart is used to connect with patients for Virtual Visits (Telemedicine).  Patients are able to view lab/test results, encounter notes, upcoming appointments, etc.  Non-urgent messages can be sent to your provider as well.   To learn more about what you can do with MyChart, go to [email or web address].    Your next appointment:   1 year(s)  Provider:   Heather E Pemberton, MD   

## 2023-05-14 NOTE — Progress Notes (Signed)
 Cardiology Office Note    Patient Name: Jeanne Lawson Date of Encounter: 05/14/2023  Primary Care Provider:  Barnetta Liberty, MD Primary Cardiologist:  Sonny Dust, MD (Inactive) Primary Electrophysiologist: None   Past Medical History    Past Medical History:  Diagnosis Date   Acute on chronic diastolic CHF (congestive heart failure) (HCC) 04/16/2021   Cancer (HCC)    right leg skin    COPD (chronic obstructive pulmonary disease) (HCC)    Coronary artery disease 1996   Known with prior mild lesion of LAD demonstrated by Cardiac Catheterization in 1996   Edema of foot    She has a history of chronic edema of the left dated back to age 34 when she suufered severe frostbite playing  on the snow as a child   Hypertension    Lower extremity edema    PAC (premature atrial contraction)    Pancreatitis    x2   PONV (postoperative nausea and vomiting)    problems waking up last time 4/16 only time   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 Has normal EF.    History of Present Illness  Jeanne Lawson is a 78 y.o. 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  Ms. Holderman has a past medical history of multiple CVAs and underwent stenting of enlarging aneurysm of posterior circulation by Dr. Alvira Josephs.  She was also treated for right ICA aneurysm measuring 8-9 mm in 07/2017.  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 Alvira Josephs.  She was admitted on 04/11/2021 with worsening of swelling in her lower extremities.  She was diuresed with Lasix  and 5.6 L negative with weight down 9 kg. She was transition to oral Lasix  60 mg and TTE completed showing EF of 60 to 65% with mild LVH and grade 1 DD.  She had Dopplers completed that were negative for DVT.  She was readmitted on 06/28/2021 once again for lower extremity swelling in the setting of noncompliance with Lasix . She was diuresed 9 L was sent to [facility] lymphedema clinic.  She was last seen by Reesa Cannon, PA on 05/16/2022 for follow-up.  During visit she was doing much better and was discharged from lymphedema clinic and no longer required leg wraps.  She had no complaints of chest pain was stable on metoprolol  and ASA.  Ms. Wente presents today for annual follow-up.  She reports since her previous visit doing well with no new cardiac complaints.She experiences occasional nocturnal diuresis, which causes weakness, and manages this by ensuring adequate potassium intake. She takes potassium supplements, 20 mEq twice daily, and furosemide  40 mg daily. She also takes Capryl, splitting the dose between morning and evening.  Her heart rate is consistently low, around 50 beats per minute. She was  previously on a higher dose of metoprolol  but experienced near syncope, leading to a reduction to 12.5 mg daily. She feels generally well, with no significant fatigue impacting her daily activities, which include household chores and cooking. She lives independently in a senior living community and has a supportive family. She uses a recliner to elevate her legs and prevent falls, and she sleeps well at night without shortness of breath. She has no current chest pain or significant weakness from her past strokes, although she notes decreased dexterity in her left hand affecting her  ability to play the piano. Patient denies chest pain, palpitations, dyspnea, PND, orthopnea, nausea, vomiting, dizziness, syncope, edema, weight gain, or early satiety.  Discussed the use of AI scribe software for clinical note transcription with the patient, who gave verbal consent to proceed.  History of Present Illness   Review of Systems  Please see the history of present illness.    All other systems reviewed and are otherwise negative except as noted above.  Physical Exam    Wt Readings from Last 3 Encounters:  05/16/22 134 lb (60.8 kg)  07/04/21 137 lb 12.6 oz (62.5 kg)  04/22/21 145 lb 8.1 oz (66 kg)   BJ:YNWGN were no vitals filed for this visit.,There is no height or weight on file to calculate BMI. GEN: Well nourished, well developed in no acute distress Neck: No JVD; No carotid bruits Pulmonary: Clear to auscultation without rales, wheezing or rhonchi  Cardiovascular: Normal rate. Regular rhythm. Normal S1. Normal S2.   Murmurs: There is no murmur.  ABDOMEN: Soft, non-tender, non-distended EXTREMITIES: Chronic lymphedema with greater than +4 swelling around the ankles and feet  EKG/LABS/ Recent Cardiac Studies   ECG personally reviewed by me today -sinus bradycardia with rate of 50 bpm and acute changes consistent with previous EKG.  Risk Assessment/Calculations:          Lab Results  Component Value Date   WBC 4.4 07/01/2021   HGB 12.0 07/01/2021   HCT 37.4 07/01/2021   MCV 88.2 07/01/2021   PLT 150 07/01/2021   Lab Results  Component Value Date   CREATININE 0.94 07/03/2021   BUN 13 07/03/2021   NA 137 07/03/2021   K 3.7 07/03/2021   CL 96 (L) 07/03/2021   CO2 32 07/03/2021   Lab Results  Component Value Date   CHOL 115 04/19/2021   HDL 52 04/19/2021   LDLCALC 46 04/19/2021   TRIG 85 04/19/2021   CHOLHDL 2.2 04/19/2021    Lab Results  Component Value Date   HGBA1C 5.1 03/08/2016   Assessment & Plan    1.  CAD: - Previous LHC completed  greater than 20 years ago with PCI and no stent placed. - Today patient reports no chest pain or angina since previous follow-up. - Continue ASA 81 mg and Toprol  12.5 mg twice daily  2.  History of CVA: - History of multiple CVAs no residual deficit noted on exam - Continue current GDMT with ASA 81 mg   3.  HFpEF: - Last TTE completed 03/2021 showing EF of 65 to 70% with mild LVH and grade 1 DD with trivial MVR - She is euvolemic with chronic venous insufficiency in the lower extremities noted complaints of shortness of breath. - Continue Lasix  40 mg and potassium 20 mEq twice daily -Low sodium diet, fluid restriction <2L, and daily weights encouraged. Educated to contact our office for weight gain of 2 lbs overnight or 5 lbs in one week.  4.  Bilateral carotid artery aneurysm: -s/p stent and coiling 08/12/2014 and 11/12/2014 - Continue current GDMT with ASA 81 mg  5. Bradycardia Heart rate at 50 bpm, likely due to metoprolol . No significant symptoms reported. Current dose well-tolerated. - Continue metoprolol  12.5 mg once daily.  6.  Chronic venous insufficiency: Edema managed with furosemide  and compression therapy. Reports effective weight management and no significant edema. - Continue furosemide  40 mg daily. - Continue potassium supplementation as prescribed  7.  Essential hypertension: Blood pressure elevated at 148/92 mmHg, likely situational. Typically well-controlled at home and was controlled on recheck at 138/88 - Monitor blood pressure at home once daily for two weeks and report results. - Provide blood pressure log and instructions for home monitoring.  Disposition: Follow-up with new cardiologist or APP in 12 months    Signed, Francene Ing, Retha Cast, NP 05/14/2023, 1:34 PM [facility]

## 2023-05-15 ENCOUNTER — Ambulatory Visit: Payer: Medicare Other | Attending: Nurse Practitioner | Admitting: Nurse Practitioner

## 2023-05-15 ENCOUNTER — Encounter: Payer: Self-pay | Admitting: Nurse Practitioner

## 2023-05-15 VITALS — BP 138/88 | HR 63 | Ht 61.0 in | Wt 126.3 lb

## 2023-05-15 DIAGNOSIS — I5032 Chronic diastolic (congestive) heart failure: Secondary | ICD-10-CM | POA: Diagnosis not present

## 2023-05-15 DIAGNOSIS — I872 Venous insufficiency (chronic) (peripheral): Secondary | ICD-10-CM

## 2023-05-15 DIAGNOSIS — I10 Essential (primary) hypertension: Secondary | ICD-10-CM

## 2023-05-15 DIAGNOSIS — Z8673 Personal history of transient ischemic attack (TIA), and cerebral infarction without residual deficits: Secondary | ICD-10-CM

## 2023-05-15 DIAGNOSIS — I671 Cerebral aneurysm, nonruptured: Secondary | ICD-10-CM

## 2023-05-15 DIAGNOSIS — I2510 Atherosclerotic heart disease of native coronary artery without angina pectoris: Secondary | ICD-10-CM

## 2023-05-15 NOTE — Patient Instructions (Signed)
 Medication Instructions:  Your physician recommends that you continue on your current medications as directed. Please refer to the Current Medication list given to you today. *If you need a refill on your cardiac medications before your next appointment, please call your pharmacy*  Lab Work: None ordered If you have labs (blood work) drawn today and your tests are completely normal, you will receive your results only by: MyChart Message (if you have MyChart) OR A paper copy in the mail If you have any lab test that is abnormal or we need to change your treatment, we will call you to review the results.  Testing/Procedures: None ordered  Follow-Up: At [facility], you and your health needs are our priority.  As part of our continuing mission to provide you with exceptional heart care, our providers are all part of one team.  This team includes your primary Cardiologist (physician) and Advanced Practice Providers or APPs (Physician Assistants and Nurse Practitioners) who all work together to provide you with the care you need, when you need it.  Your next appointment:   6 month(s)  Provider:   Grady Lawman. MD  We recommend signing up for the patient portal called "MyChart".  Sign up information is provided on this After Visit Summary.  MyChart is used to connect with patients for Virtual Visits (Telemedicine).  Patients are able to view lab/test results, encounter notes, upcoming appointments, etc.  Non-urgent messages can be sent to your provider as well.   To learn more about what you can do with MyChart, go to [email or web address].   Other Instructions Please check your weight daily. Please contact the office if you gain more than 2lbs in a day or 5lbs in a week.  Limit your salt intake to 1500-2000mg  per day or 500mg  of Sodium per meal.  Check your blood pressure daily for 2 weeks, then contact the office with your readings.  Contact the office either by phone or  MyChart with your readings.  Make sure to check your blood pressure 2 hours after taking your medications.   AVOID these things for 30 minutes before checking your blood pressure: No Drinking caffeine. No Drinking alcohol. No Eating. No Smoking. No Exercising.  Five minutes before checking your blood pressure: Pee. Sit in a dining chair. Avoid sitting in a soft couch or armchair. Be quiet. Do not talk.       1st Floor: - Lobby - Registration  - Pharmacy  - Lab - Cafe  2nd Floor: - PV Lab - Diagnostic Testing (echo, CT, nuclear med)  3rd Floor: - Vacant  4th Floor: - TCTS (cardiothoracic surgery) - AFib Clinic - Structural Heart Clinic - Vascular Surgery  - Vascular Ultrasound  5th Floor: - HeartCare Cardiology (general and EP) - Clinical Pharmacy for coumadin, hypertension, lipid, weight-loss medications, and med management appointments    Valet parking services will be available as well.

## 2023-06-08 ENCOUNTER — Telehealth: Payer: Self-pay | Admitting: *Deleted

## 2023-06-08 NOTE — Telephone Encounter (Signed)
 Called and spoke to pt regarding the BP log she mailed in (sent to HIM on 06/08/2023). Dr. Acharya has reviewed it and the numbers are all WNL. Pt states she takes 12.5 mg Toprol  XL BID and is wondering if she needs to decrease the dose or stop it completely. Advised pt to not make any medication changes as her numbers all look GREAT. Her HR ranges= 53-66, usually 55-60 (per BP log). Advised pt to call us  if her HR is consistently lower than 50 and for now continue her meds as prescribed. She verbalized understanding. She will need medication refills around 06/25/2023 but tells me that PCP's office fills these. No other concerns.

## 2023-07-21 ENCOUNTER — Encounter (HOSPITAL_COMMUNITY): Payer: Self-pay | Admitting: Interventional Radiology

## 2023-11-20 ENCOUNTER — Ambulatory Visit: Admitting: Internal Medicine

## 2024-02-07 ENCOUNTER — Ambulatory Visit: Admitting: Internal Medicine

## 2024-02-22 ENCOUNTER — Ambulatory Visit: Attending: Internal Medicine | Admitting: Internal Medicine

## 2024-02-22 VITALS — BP 134/82 | HR 56 | Ht 61.0 in | Wt 123.0 lb

## 2024-02-22 DIAGNOSIS — I5032 Chronic diastolic (congestive) heart failure: Secondary | ICD-10-CM | POA: Diagnosis not present

## 2024-02-22 NOTE — Patient Instructions (Signed)
 Medication Instructions:  No Changes  *If you need a refill on your cardiac medications before your next appointment, please call your pharmacy*  Lab Work: None  Follow-Up: At [facility], you and your health needs are our priority.  As part of our continuing mission to provide you with exceptional heart care, our providers are all part of one team.  This team includes your primary Cardiologist (physician) and Advanced Practice Providers or APPs (Physician Assistants and Nurse Practitioners) who all work together to provide you with the care you need, when you need it.  Your next appointment:   1 year(s)  Provider:   Soyla DELENA Merck, MD    We recommend signing up for the patient portal called MyChart.  Sign up information is provided on this After Visit Summary.  MyChart is used to connect with patients for Virtual Visits (Telemedicine).  Patients are able to view lab/test results, encounter notes, upcoming appointments, etc.  Non-urgent messages can be sent to your provider as well.   To learn more about what you can do with MyChart, go to [email or web address].   Other Instructions Please call us  (URGENT matters) or send a MyChart message with any Cardiology related questions/concerns.  [phone].  Thank you!

## 2024-02-22 NOTE — Progress Notes (Unsigned)
" °  Cardiology Office Note:  .   Date:  02/22/2024  ID:  Jeanne Lawson, DOB [birth year], MRN [id number] PCP: Larnell Hamilton, MD  [facility] Providers Cardiologist:  None    History of Present Illness: .   Jeanne Lawson is a 79 y.o. female.  Discussed the use of AI scribe software for clinical note transcription with the patient, who gave verbal consent to proceed.  History of Present Illness     ROS: negative except per HPI above.  Studies Reviewed: .        Results  Risk Assessment/Calculations:   {Does this patient have ATRIAL FIBRILLATION?:[id number]}   Physical Exam:   VS:  BP 134/82 (BP Location: Left Arm, Patient Position: Sitting, Cuff Size: Normal)   Pulse (!) 56   Ht 5' 1 (1.549 m)   Wt 123 lb (55.8 kg)   BMI 23.24 kg/m    Wt Readings from Last 3 Encounters:  02/22/24 123 lb (55.8 kg)  05/15/23 126 lb 4.8 oz (57.3 kg)  05/16/22 134 lb (60.8 kg)     Physical Exam    ASSESSMENT AND PLAN: .    Assessment and Plan Assessment & Plan       Soyla Merck, MD, [facility] "
